# Patient Record
Sex: Female | Born: 1960 | Race: Black or African American | Hispanic: No | Smoking: Former smoker
Health system: Southern US, Community
[De-identification: ages and names within clinical notes are randomized; demographics above are authoritative.]

## PROBLEM LIST (undated history)

## (undated) DIAGNOSIS — K5790 Diverticulosis of intestine, part unspecified, without perforation or abscess without bleeding: Secondary | ICD-10-CM

## (undated) DIAGNOSIS — R011 Cardiac murmur, unspecified: Secondary | ICD-10-CM

## (undated) DIAGNOSIS — N289 Disorder of kidney and ureter, unspecified: Secondary | ICD-10-CM

## (undated) DIAGNOSIS — D649 Anemia, unspecified: Secondary | ICD-10-CM

## (undated) DIAGNOSIS — F419 Anxiety disorder, unspecified: Secondary | ICD-10-CM

## (undated) DIAGNOSIS — I10 Essential (primary) hypertension: Secondary | ICD-10-CM

## (undated) DIAGNOSIS — D62 Acute posthemorrhagic anemia: Secondary | ICD-10-CM

## (undated) DIAGNOSIS — K922 Gastrointestinal hemorrhage, unspecified: Secondary | ICD-10-CM

## (undated) DIAGNOSIS — K859 Acute pancreatitis without necrosis or infection, unspecified: Secondary | ICD-10-CM

## (undated) DIAGNOSIS — Z992 Dependence on renal dialysis: Secondary | ICD-10-CM

## (undated) DIAGNOSIS — Z940 Kidney transplant status: Secondary | ICD-10-CM

## (undated) DIAGNOSIS — N186 End stage renal disease: Secondary | ICD-10-CM

## (undated) DIAGNOSIS — N2581 Secondary hyperparathyroidism of renal origin: Secondary | ICD-10-CM

## (undated) DIAGNOSIS — E039 Hypothyroidism, unspecified: Secondary | ICD-10-CM

## (undated) DIAGNOSIS — M1990 Unspecified osteoarthritis, unspecified site: Secondary | ICD-10-CM

## (undated) DIAGNOSIS — N059 Unspecified nephritic syndrome with unspecified morphologic changes: Secondary | ICD-10-CM

## (undated) DIAGNOSIS — F40240 Claustrophobia: Secondary | ICD-10-CM

## (undated) HISTORY — PX: DILATION AND CURETTAGE OF UTERUS: SHX78

## (undated) HISTORY — PX: RETINAL DETACHMENT SURGERY: SHX105

## (undated) HISTORY — DX: Essential (primary) hypertension: I10

## (undated) HISTORY — PX: THROMBECTOMY / ARTERIOVENOUS GRAFT REVISION: SUR1351

## (undated) HISTORY — DX: Unspecified nephritic syndrome with unspecified morphologic changes: N05.9

## (undated) HISTORY — DX: Acute pancreatitis without necrosis or infection, unspecified: K85.90

## (undated) HISTORY — PX: EYE SURGERY: SHX253

## (undated) HISTORY — PX: BREAST BIOPSY: SHX20

## (undated) HISTORY — PX: PERITONEAL CATHETER INSERTION: SHX2223

## (undated) HISTORY — PX: THROMBECTOMY AND REVISION OF ARTERIOVENTOUS (AV) GORETEX  GRAFT: SHX6120

## (undated) HISTORY — PX: COLONOSCOPY: SHX174

---

## 1995-01-18 HISTORY — PX: KIDNEY TRANSPLANT: SHX239

## 1997-05-12 ENCOUNTER — Emergency Department (HOSPITAL_COMMUNITY): Admission: EM | Admit: 1997-05-12 | Discharge: 1997-05-12 | Payer: Self-pay | Admitting: Emergency Medicine

## 1997-05-14 ENCOUNTER — Other Ambulatory Visit: Admission: RE | Admit: 1997-05-14 | Discharge: 1997-05-14 | Payer: Self-pay | Admitting: Nephrology

## 1997-05-15 ENCOUNTER — Other Ambulatory Visit: Admission: RE | Admit: 1997-05-15 | Discharge: 1997-05-15 | Payer: Self-pay | Admitting: Nephrology

## 1997-05-19 ENCOUNTER — Other Ambulatory Visit: Admission: RE | Admit: 1997-05-19 | Discharge: 1997-05-19 | Payer: Self-pay | Admitting: Nephrology

## 1997-06-02 ENCOUNTER — Other Ambulatory Visit: Admission: RE | Admit: 1997-06-02 | Discharge: 1997-06-02 | Payer: Self-pay | Admitting: Nephrology

## 1997-06-20 ENCOUNTER — Other Ambulatory Visit: Admission: RE | Admit: 1997-06-20 | Discharge: 1997-06-20 | Payer: Self-pay | Admitting: Nephrology

## 1997-09-17 ENCOUNTER — Ambulatory Visit (HOSPITAL_COMMUNITY): Admission: RE | Admit: 1997-09-17 | Discharge: 1997-09-17 | Payer: Self-pay | Admitting: Obstetrics and Gynecology

## 1997-10-21 ENCOUNTER — Encounter: Payer: Self-pay | Admitting: Nephrology

## 1997-10-21 ENCOUNTER — Inpatient Hospital Stay (HOSPITAL_COMMUNITY): Admission: EM | Admit: 1997-10-21 | Discharge: 1997-10-30 | Payer: Self-pay | Admitting: Emergency Medicine

## 1997-10-23 ENCOUNTER — Encounter: Payer: Self-pay | Admitting: Nephrology

## 1998-02-27 ENCOUNTER — Encounter: Payer: Self-pay | Admitting: Nephrology

## 1998-04-29 ENCOUNTER — Encounter (HOSPITAL_COMMUNITY): Admission: RE | Admit: 1998-04-29 | Discharge: 1998-07-28 | Payer: Self-pay | Admitting: Nephrology

## 1998-10-26 ENCOUNTER — Emergency Department (HOSPITAL_COMMUNITY): Admission: EM | Admit: 1998-10-26 | Discharge: 1998-10-26 | Payer: Self-pay | Admitting: Emergency Medicine

## 1999-07-16 ENCOUNTER — Emergency Department (HOSPITAL_COMMUNITY): Admission: EM | Admit: 1999-07-16 | Discharge: 1999-07-16 | Payer: Self-pay | Admitting: Emergency Medicine

## 1999-07-16 ENCOUNTER — Inpatient Hospital Stay (HOSPITAL_COMMUNITY): Admission: EM | Admit: 1999-07-16 | Discharge: 1999-07-24 | Payer: Self-pay | Admitting: Emergency Medicine

## 1999-07-18 ENCOUNTER — Encounter: Payer: Self-pay | Admitting: Nephrology

## 1999-07-19 ENCOUNTER — Encounter: Payer: Self-pay | Admitting: Nephrology

## 1999-07-22 ENCOUNTER — Encounter: Payer: Self-pay | Admitting: General Surgery

## 1999-08-18 ENCOUNTER — Encounter: Payer: Self-pay | Admitting: Nephrology

## 1999-08-18 ENCOUNTER — Inpatient Hospital Stay (HOSPITAL_COMMUNITY): Admission: EM | Admit: 1999-08-18 | Discharge: 1999-08-23 | Payer: Self-pay | Admitting: Emergency Medicine

## 1999-08-18 HISTORY — PX: PERITONEAL CATHETER REMOVAL: SUR1106

## 1999-09-06 ENCOUNTER — Inpatient Hospital Stay (HOSPITAL_COMMUNITY): Admission: EM | Admit: 1999-09-06 | Discharge: 1999-09-10 | Payer: Self-pay | Admitting: Emergency Medicine

## 1999-09-06 ENCOUNTER — Encounter: Payer: Self-pay | Admitting: Nephrology

## 1999-09-08 ENCOUNTER — Encounter: Payer: Self-pay | Admitting: Nephrology

## 1999-09-28 ENCOUNTER — Encounter (HOSPITAL_COMMUNITY): Admission: RE | Admit: 1999-09-28 | Discharge: 1999-12-27 | Payer: Self-pay | Admitting: *Deleted

## 2000-04-10 ENCOUNTER — Encounter (INDEPENDENT_AMBULATORY_CARE_PROVIDER_SITE_OTHER): Payer: Self-pay | Admitting: Specialist

## 2000-04-10 ENCOUNTER — Inpatient Hospital Stay (HOSPITAL_COMMUNITY): Admission: RE | Admit: 2000-04-10 | Discharge: 2000-04-13 | Payer: Self-pay

## 2000-09-12 ENCOUNTER — Encounter: Payer: Self-pay | Admitting: Nephrology

## 2000-09-12 ENCOUNTER — Inpatient Hospital Stay (HOSPITAL_COMMUNITY): Admission: AD | Admit: 2000-09-12 | Discharge: 2000-09-16 | Payer: Self-pay | Admitting: Critical Care Medicine

## 2000-09-15 ENCOUNTER — Encounter: Payer: Self-pay | Admitting: Nephrology

## 2000-09-17 HISTORY — PX: AV FISTULA PLACEMENT: SHX1204

## 2000-09-25 ENCOUNTER — Ambulatory Visit (HOSPITAL_COMMUNITY): Admission: RE | Admit: 2000-09-25 | Discharge: 2000-09-25 | Payer: Self-pay | Admitting: Vascular Surgery

## 2000-11-24 ENCOUNTER — Encounter: Payer: Self-pay | Admitting: Emergency Medicine

## 2000-11-24 ENCOUNTER — Emergency Department (HOSPITAL_COMMUNITY): Admission: EM | Admit: 2000-11-24 | Discharge: 2000-11-24 | Payer: Self-pay | Admitting: Emergency Medicine

## 2000-12-13 ENCOUNTER — Ambulatory Visit (HOSPITAL_COMMUNITY): Admission: RE | Admit: 2000-12-13 | Discharge: 2000-12-13 | Payer: Self-pay | Admitting: Nephrology

## 2000-12-13 ENCOUNTER — Encounter: Payer: Self-pay | Admitting: Nephrology

## 2001-01-05 ENCOUNTER — Inpatient Hospital Stay (HOSPITAL_COMMUNITY): Admission: EM | Admit: 2001-01-05 | Discharge: 2001-01-08 | Payer: Self-pay | Admitting: Emergency Medicine

## 2001-01-05 ENCOUNTER — Encounter: Payer: Self-pay | Admitting: Nephrology

## 2001-01-06 ENCOUNTER — Encounter: Payer: Self-pay | Admitting: Nephrology

## 2001-01-07 ENCOUNTER — Encounter: Payer: Self-pay | Admitting: Nephrology

## 2001-02-19 ENCOUNTER — Ambulatory Visit (HOSPITAL_COMMUNITY): Admission: RE | Admit: 2001-02-19 | Discharge: 2001-02-19 | Payer: Self-pay | Admitting: Nephrology

## 2001-04-19 ENCOUNTER — Other Ambulatory Visit: Admission: RE | Admit: 2001-04-19 | Discharge: 2001-04-19 | Payer: Self-pay | Admitting: Obstetrics & Gynecology

## 2001-07-24 ENCOUNTER — Emergency Department (HOSPITAL_COMMUNITY): Admission: EM | Admit: 2001-07-24 | Discharge: 2001-07-25 | Payer: Self-pay | Admitting: Emergency Medicine

## 2001-08-31 ENCOUNTER — Inpatient Hospital Stay (HOSPITAL_COMMUNITY): Admission: EM | Admit: 2001-08-31 | Discharge: 2001-09-02 | Payer: Self-pay | Admitting: Emergency Medicine

## 2001-08-31 ENCOUNTER — Emergency Department (HOSPITAL_COMMUNITY): Admission: EM | Admit: 2001-08-31 | Discharge: 2001-08-31 | Payer: Self-pay | Admitting: *Deleted

## 2001-09-01 ENCOUNTER — Encounter: Payer: Self-pay | Admitting: Nephrology

## 2001-09-26 ENCOUNTER — Ambulatory Visit (HOSPITAL_COMMUNITY): Admission: RE | Admit: 2001-09-26 | Discharge: 2001-09-26 | Payer: Self-pay | Admitting: Nephrology

## 2002-02-17 HISTORY — PX: THROMBECTOMY: PRO61

## 2002-03-11 ENCOUNTER — Encounter: Payer: Self-pay | Admitting: Nephrology

## 2002-03-11 ENCOUNTER — Inpatient Hospital Stay (HOSPITAL_COMMUNITY): Admission: EM | Admit: 2002-03-11 | Discharge: 2002-03-16 | Payer: Self-pay | Admitting: Emergency Medicine

## 2002-03-13 ENCOUNTER — Encounter: Payer: Self-pay | Admitting: Vascular Surgery

## 2002-04-03 ENCOUNTER — Emergency Department (HOSPITAL_COMMUNITY): Admission: EM | Admit: 2002-04-03 | Discharge: 2002-04-03 | Payer: Self-pay | Admitting: Emergency Medicine

## 2002-04-27 ENCOUNTER — Inpatient Hospital Stay (HOSPITAL_COMMUNITY): Admission: EM | Admit: 2002-04-27 | Discharge: 2002-05-05 | Payer: Self-pay | Admitting: Emergency Medicine

## 2002-04-27 ENCOUNTER — Encounter: Payer: Self-pay | Admitting: Nephrology

## 2002-04-30 ENCOUNTER — Encounter: Payer: Self-pay | Admitting: Nephrology

## 2002-05-02 ENCOUNTER — Encounter: Payer: Self-pay | Admitting: Cardiology

## 2002-06-19 ENCOUNTER — Ambulatory Visit (HOSPITAL_COMMUNITY): Admission: RE | Admit: 2002-06-19 | Discharge: 2002-06-19 | Payer: Self-pay | Admitting: Nephrology

## 2002-07-16 ENCOUNTER — Encounter: Payer: Self-pay | Admitting: Nephrology

## 2002-07-16 ENCOUNTER — Inpatient Hospital Stay (HOSPITAL_COMMUNITY): Admission: EM | Admit: 2002-07-16 | Discharge: 2002-07-22 | Payer: Self-pay | Admitting: Emergency Medicine

## 2002-07-16 ENCOUNTER — Encounter: Payer: Self-pay | Admitting: Emergency Medicine

## 2002-07-17 ENCOUNTER — Encounter: Payer: Self-pay | Admitting: Nephrology

## 2002-07-18 HISTORY — PX: ARTERIOVENOUS GRAFT PLACEMENT: SUR1029

## 2002-07-20 ENCOUNTER — Encounter (INDEPENDENT_AMBULATORY_CARE_PROVIDER_SITE_OTHER): Payer: Self-pay | Admitting: *Deleted

## 2002-07-24 ENCOUNTER — Ambulatory Visit (HOSPITAL_COMMUNITY): Admission: RE | Admit: 2002-07-24 | Discharge: 2002-07-24 | Payer: Self-pay | Admitting: Internal Medicine

## 2002-07-24 ENCOUNTER — Encounter: Payer: Self-pay | Admitting: Internal Medicine

## 2002-08-16 ENCOUNTER — Ambulatory Visit (HOSPITAL_COMMUNITY): Admission: RE | Admit: 2002-08-16 | Discharge: 2002-08-16 | Payer: Self-pay | Admitting: Vascular Surgery

## 2002-08-23 ENCOUNTER — Ambulatory Visit (HOSPITAL_COMMUNITY): Admission: RE | Admit: 2002-08-23 | Discharge: 2002-08-23 | Payer: Self-pay | Admitting: Vascular Surgery

## 2002-08-23 ENCOUNTER — Encounter: Payer: Self-pay | Admitting: Vascular Surgery

## 2002-09-06 ENCOUNTER — Emergency Department (HOSPITAL_COMMUNITY): Admission: EM | Admit: 2002-09-06 | Discharge: 2002-09-07 | Payer: Self-pay | Admitting: Emergency Medicine

## 2002-09-06 ENCOUNTER — Encounter: Payer: Self-pay | Admitting: Nephrology

## 2002-09-13 ENCOUNTER — Encounter: Payer: Self-pay | Admitting: Vascular Surgery

## 2002-09-13 ENCOUNTER — Ambulatory Visit (HOSPITAL_COMMUNITY): Admission: RE | Admit: 2002-09-13 | Discharge: 2002-09-13 | Payer: Self-pay | Admitting: Vascular Surgery

## 2002-10-11 ENCOUNTER — Ambulatory Visit (HOSPITAL_COMMUNITY): Admission: RE | Admit: 2002-10-11 | Discharge: 2002-10-11 | Payer: Self-pay | Admitting: Nephrology

## 2002-11-28 ENCOUNTER — Inpatient Hospital Stay (HOSPITAL_COMMUNITY): Admission: EM | Admit: 2002-11-28 | Discharge: 2002-12-02 | Payer: Self-pay | Admitting: Emergency Medicine

## 2002-12-11 ENCOUNTER — Inpatient Hospital Stay (HOSPITAL_COMMUNITY): Admission: EM | Admit: 2002-12-11 | Discharge: 2002-12-13 | Payer: Self-pay | Admitting: Emergency Medicine

## 2002-12-18 ENCOUNTER — Other Ambulatory Visit: Admission: RE | Admit: 2002-12-18 | Discharge: 2002-12-18 | Payer: Self-pay | Admitting: Obstetrics & Gynecology

## 2002-12-23 ENCOUNTER — Ambulatory Visit (HOSPITAL_COMMUNITY): Admission: RE | Admit: 2002-12-23 | Discharge: 2002-12-23 | Payer: Self-pay | Admitting: Nephrology

## 2003-02-23 ENCOUNTER — Ambulatory Visit (HOSPITAL_COMMUNITY): Admission: RE | Admit: 2003-02-23 | Discharge: 2003-02-23 | Payer: Self-pay | Admitting: Gastroenterology

## 2003-07-08 ENCOUNTER — Ambulatory Visit (HOSPITAL_COMMUNITY): Admission: RE | Admit: 2003-07-08 | Discharge: 2003-07-08 | Payer: Self-pay | Admitting: Vascular Surgery

## 2003-07-18 HISTORY — PX: ARTERIOVENOUS GRAFT PLACEMENT: SUR1029

## 2003-08-12 ENCOUNTER — Ambulatory Visit (HOSPITAL_COMMUNITY): Admission: RE | Admit: 2003-08-12 | Discharge: 2003-08-12 | Payer: Self-pay | Admitting: Vascular Surgery

## 2003-10-03 ENCOUNTER — Encounter (HOSPITAL_COMMUNITY): Admission: RE | Admit: 2003-10-03 | Discharge: 2003-10-06 | Payer: Self-pay | Admitting: Nephrology

## 2003-11-18 ENCOUNTER — Ambulatory Visit (HOSPITAL_COMMUNITY): Admission: RE | Admit: 2003-11-18 | Discharge: 2003-11-18 | Payer: Self-pay | Admitting: Vascular Surgery

## 2003-12-24 ENCOUNTER — Inpatient Hospital Stay (HOSPITAL_COMMUNITY): Admission: EM | Admit: 2003-12-24 | Discharge: 2003-12-27 | Payer: Self-pay | Admitting: Emergency Medicine

## 2003-12-24 ENCOUNTER — Ambulatory Visit: Payer: Self-pay | Admitting: Internal Medicine

## 2004-02-02 ENCOUNTER — Ambulatory Visit (HOSPITAL_COMMUNITY): Admission: RE | Admit: 2004-02-02 | Discharge: 2004-02-02 | Payer: Self-pay | Admitting: Vascular Surgery

## 2004-03-22 ENCOUNTER — Ambulatory Visit: Payer: Self-pay | Admitting: Internal Medicine

## 2004-04-07 ENCOUNTER — Ambulatory Visit (HOSPITAL_COMMUNITY): Admission: RE | Admit: 2004-04-07 | Discharge: 2004-04-07 | Payer: Self-pay | Admitting: Internal Medicine

## 2004-04-12 ENCOUNTER — Ambulatory Visit: Payer: Self-pay | Admitting: Internal Medicine

## 2004-06-23 ENCOUNTER — Inpatient Hospital Stay (HOSPITAL_COMMUNITY): Admission: EM | Admit: 2004-06-23 | Discharge: 2004-06-28 | Payer: Self-pay | Admitting: Emergency Medicine

## 2004-06-24 ENCOUNTER — Ambulatory Visit: Payer: Self-pay | Admitting: Gastroenterology

## 2004-08-02 ENCOUNTER — Inpatient Hospital Stay (HOSPITAL_COMMUNITY): Admission: AD | Admit: 2004-08-02 | Discharge: 2004-08-06 | Payer: Self-pay | Admitting: Nephrology

## 2004-08-07 ENCOUNTER — Ambulatory Visit (HOSPITAL_COMMUNITY): Admission: RE | Admit: 2004-08-07 | Discharge: 2004-08-07 | Payer: Self-pay | Admitting: Vascular Surgery

## 2004-08-07 ENCOUNTER — Emergency Department (HOSPITAL_COMMUNITY): Admission: EM | Admit: 2004-08-07 | Discharge: 2004-08-07 | Payer: Self-pay | Admitting: Emergency Medicine

## 2004-08-10 ENCOUNTER — Ambulatory Visit (HOSPITAL_COMMUNITY): Admission: RE | Admit: 2004-08-10 | Discharge: 2004-08-10 | Payer: Self-pay | Admitting: Vascular Surgery

## 2004-12-01 ENCOUNTER — Encounter: Admission: RE | Admit: 2004-12-01 | Discharge: 2004-12-01 | Payer: Self-pay | Admitting: Obstetrics and Gynecology

## 2004-12-24 ENCOUNTER — Ambulatory Visit (HOSPITAL_COMMUNITY): Admission: RE | Admit: 2004-12-24 | Discharge: 2004-12-24 | Payer: Self-pay | Admitting: Nephrology

## 2005-07-11 ENCOUNTER — Ambulatory Visit (HOSPITAL_COMMUNITY): Admission: RE | Admit: 2005-07-11 | Discharge: 2005-07-11 | Payer: Self-pay | Admitting: Nephrology

## 2005-09-11 ENCOUNTER — Emergency Department (HOSPITAL_COMMUNITY): Admission: EM | Admit: 2005-09-11 | Discharge: 2005-09-11 | Payer: Self-pay | Admitting: Emergency Medicine

## 2005-11-10 ENCOUNTER — Inpatient Hospital Stay (HOSPITAL_COMMUNITY): Admission: EM | Admit: 2005-11-10 | Discharge: 2005-11-13 | Payer: Self-pay | Admitting: Emergency Medicine

## 2006-01-02 ENCOUNTER — Encounter: Admission: RE | Admit: 2006-01-02 | Discharge: 2006-01-02 | Payer: Self-pay | Admitting: Nephrology

## 2006-06-07 ENCOUNTER — Ambulatory Visit: Payer: Self-pay | Admitting: Surgical

## 2006-06-07 ENCOUNTER — Ambulatory Visit (HOSPITAL_COMMUNITY): Admission: RE | Admit: 2006-06-07 | Discharge: 2006-06-07 | Payer: Self-pay | Admitting: Vascular Surgery

## 2006-10-14 ENCOUNTER — Ambulatory Visit (HOSPITAL_COMMUNITY): Admission: RE | Admit: 2006-10-14 | Discharge: 2006-10-14 | Payer: Self-pay | Admitting: Nephrology

## 2007-01-04 ENCOUNTER — Inpatient Hospital Stay (HOSPITAL_COMMUNITY): Admission: EM | Admit: 2007-01-04 | Discharge: 2007-01-08 | Payer: Self-pay | Admitting: Emergency Medicine

## 2007-06-22 ENCOUNTER — Encounter: Admission: RE | Admit: 2007-06-22 | Discharge: 2007-06-22 | Payer: Self-pay | Admitting: Nephrology

## 2007-09-28 ENCOUNTER — Encounter: Admission: RE | Admit: 2007-09-28 | Discharge: 2007-09-28 | Payer: Self-pay | Admitting: Obstetrics and Gynecology

## 2007-11-26 ENCOUNTER — Ambulatory Visit (HOSPITAL_COMMUNITY): Admission: RE | Admit: 2007-11-26 | Discharge: 2007-11-26 | Payer: Self-pay | Admitting: Nephrology

## 2008-01-21 ENCOUNTER — Ambulatory Visit (HOSPITAL_COMMUNITY): Admission: RE | Admit: 2008-01-21 | Discharge: 2008-01-21 | Payer: Self-pay | Admitting: Vascular Surgery

## 2008-01-21 ENCOUNTER — Ambulatory Visit: Payer: Self-pay | Admitting: Vascular Surgery

## 2008-11-21 ENCOUNTER — Encounter: Admission: RE | Admit: 2008-11-21 | Discharge: 2008-11-21 | Payer: Self-pay | Admitting: Nephrology

## 2010-02-06 ENCOUNTER — Encounter: Payer: Self-pay | Admitting: Nephrology

## 2010-02-06 ENCOUNTER — Encounter: Payer: Self-pay | Admitting: Gastroenterology

## 2010-05-03 LAB — POCT I-STAT 4, (NA,K, GLUC, HGB,HCT): Sodium: 132 mEq/L — ABNORMAL LOW (ref 135–145)

## 2010-06-01 NOTE — Op Note (Signed)
[name], [name] NO.:  [id number]   MEDICAL RECORD NO.:  [id number]          PATIENT TYPE:  AMB   LOCATION:  SDS                          FACILITY:  [facility]   PHYSICIAN:  Rosetta Posner, M.D.    DATE OF BIRTH:  1960/03/11   DATE OF PROCEDURE:  01/21/2008  DATE OF DISCHARGE:  01/21/2008                               OPERATIVE REPORT   PREOPERATIVE DIAGNOSIS:  End-stage renal disease with occluded right  upper arm AV Gor-Tex graft.   POSTOPERATIVE DIAGNOSIS:  End-stage renal disease with occluded right  upper arm AV Gor-Tex graft.   PROCEDURE:  Thrombectomy and revision interposition jump to higher  axillary vein of right upper arm AV Gor-Tex graft.   SURGEON:  Rosetta Posner, MD   ASSISTANT:  Jacinta Shoe, PA-C   ANESTHESIA:  MAC.   COMPLICATIONS:  None.   DISPOSITION:  To recovery room in stable.   PROCEDURE IN DETAIL:  The patient was taken to the operating room and  placed in supine position.  The area of the right arm and right axilla  were prepped and draped in usual sterile fashion.  Using local  anesthesia, an incision was made over the prior axillary incision scar  and carried down to the graft.  The graft was high into the axilla.  The  patient had had a recent sonogram and angioplasty of the venous  anastomosis and did have thickening and a fair degree of stenosis of  this area.  The vein was exposed high in the axilla above the level of  stenosis.  This was above the level of valve cusps.  The vein above this  area was a large caliber.  The graft was opened, transversed near the  venous anastomosis, the graft itself was thrombectomized, and excellent  inflow was encountered.  This was slushed with normal saline and  reoccluded.  Next, the vein was exposed high in the axilla and was  occluded.  Tributary branches were ligated and divided, and vein was  transected.  A new interposition graft of 8-mm Gore-Tex was brought into  the field  and was sewn end-to-end to the vein with a running 6-0 Prolene  suture.  This was then cut to appropriate length and was sewn to end-to-  end to the old graft with a running 6-0 Prolene suture.  Clamps were  removed and excellent thrill was noted.  The wounds were irrigated with  saline.  Hemostasis with electrocautery.  Wounds were closed with 3-0  Vicryl in the subcutaneous and subcuticular tissues.  Benzoin and Steri-  Strips were applied.      Rosetta Posner, M.D.  Electronically Signed    TFE/MEDQ  D:  01/21/2008  T:  01/22/2008  Job:  [id number]

## 2010-06-01 NOTE — H&P (Signed)
NAMEGRAECYN, [name] NO.:  [id number]   MEDICAL RECORD NO.:  [id number]          PATIENT TYPE:  INP   LOCATION:  5123                         FACILITY:  [facility]   PHYSICIAN:  Windy Kalata, M.D.DATE OF BIRTH:  May 04, 1960   DATE OF ADMISSION:  01/04/2007  DATE OF DISCHARGE:                              HISTORY & PHYSICAL   REASON FOR ADMISSION:  Abdominal pain, possible pancreatitis.   HPI:  This is a 50 year old black female with a history of idiopathic  pancreatitis, who complains of intermittent nausea and vomiting and  diarrhea over the last two weeks.  Yesterday, she was in her usual state  of health, but today, after dialysis, developed severe abdominal pain  and vomiting on multiple occasions, along with loose, watery stools.  This is very similar to previous presentations with pancreatitis with  the last one being a little over a year ago.  She did have a CMET,  amylase and lipase done as an outpatient on February 18, 2006, all of  which were normal.  Currently, laboratory in the emergency room is  pending.   PAST MEDICAL HISTORY:  1. Significant for end-stage renal disease, secondary to      glomerulonephritis.  2. History of pancreatitis with negative workup.  3. History of hypertension, though currently not on antihypertensives.  4. Questionable history of ACE-inhibitor-induced angioedema of the      bowel.   ALLERGIES:  CODEINE causes urticaria.  PENICILLIN causes urticaria.  ACE  INHIBITOR, questionable angioedema of the bowel.   CURRENT MEDICATIONS INCLUDE:  1. Pancrease two tablets t.i.d. with meals.  2. Phos-Lo one with meals.  3. Zetia 10 mg a day.  4. Nephro-Vite one a day.   SOCIAL HISTORY:  She is an ex-smoker, quit in 2003.  Denies alcohol use.  She is divorced and has one daughter.  She lives with her mother.   FAMILY HISTORY:  Unknown as she is adopted.   REVIEW OF SYSTEMS:  Appetite was good yesterday, though obviously  decreased right now.  No chest pains, no shortness of breath.  Abdominal  pain as noted above, which she describes as crampy, predominantly in the  periumbilical region.  No new neuropathic symptoms.  No new arthritic  complaints.  No dysuria.  Rest of review of systems is unremarkable.   PHYSICAL EXAM:  Temperature 99.5, pulse 91, respirations 20, blood  pressure 153/93.  IN GENERAL:  A 50 year old black female in moderate distress, secondary  to abdominal pain.  NECK:  Reveals no JVD, no lymphadenopathy, no bruits.  LUNGS:  Clear to auscultation.  HEART:  Regular rate and rhythm without murmur, rub or gallop.  ABDOMEN:  Positive bowel sounds.  There is diffuse tenderness, though  mostly over the periumbilical region.  Abdomen is soft with no rebound.  Pulses are 2/4 in the carotids, radials, femorals, dorsalis pedis and  posterior tibial.  EXTREMITIES:  Reveal no clubbing, cyanosis or edema.  There is an AV  graft in the left upper arm with a nice thrill and bruit.  NEUROLOGIC EXAM:  Cranial nerves intact.  Motor and sensory intact.  No  asterixis.   LABORATORY:  Pending.   IMPRESSION:  1. Abdominal pain, probably secondary to recurrent pancreatitis.  2. Anemia.  3. Secondary hyperparathyroidism.  4. End-stage renal disease.   PLAN:  1. We will obtain CBC, CMET with phosphorus, amylase, lipase and acute      abdominal series.  2. We will admit for IV pain control with IV morphine and control of      nausea with IV Zofran.  3. We will repeat laboratory in the morning.  4. We will keep n.p.o. for now and hold her medications for now.           ______________________________  Windy Kalata, M.D.     MTM/MEDQ  D:  01/04/2007  T:  01/05/2007  Job:  [id number]

## 2010-06-04 NOTE — H&P (Signed)
[place]. [facility]  Patient:    [name], [name] Visit Number: [id number] MRN: [id number]          Service Type: MED Location: [id number] Attending Physician:  Selena Batten Dictated by:   Vela Prose, P.A. Admit Date:  09/12/2000   CC:         [place]. Rise Patience, M.D.   History and Physical  HISTORY OF PRESENT ILLNESS:  [name] is a 50 year old African-American female with end-stage renal disease on hemodialysis at the [facility]. Today at dialysis, she had chills and a temperature of 101 degrees, which did not decrease with antibiotics.  The patient went home and the temperature increased up to 102 degrees.  She called the [facility], who informed her to go straight to the hospital.  She denied any coughing, dysuria (makes very little urine), abdominal pain, nausea, vomiting, or diarrhea.  She has been admitted for catheter removal and antibiotic therapy.  At the hemodialysis center, she received vancomycin 1 g and tobramycin 120 mg on September 12, 2000.  PAST MEDICAL HISTORY: 1. 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    In August of 2001, she had a PD catheter removal and Ash catheter placed. 2. Malignant hypertension. 3. Secondary hyperparathyroidism.  ALLERGIES:  CODEINE and PENICILLIN.  MEDICATIONS: 1. Os-Cal 500 mg two a.c. three times a day. 2. Darvocet-N 100 one to two q.6h. p.r.n. pain. 3. Reglan 5 mg a.c. and h.s. q.i.d. 4. Nephro-Vite one p.o. q.d.  FAMILY HISTORY:  Noncontributory.  SOCIAL HISTORY:  She lives in [place], [place].  She has one daughter, who is 16 years old.  REVIEW OF SYSTEMS:  Positive for lower back pain, weakness, and fatigue. Negative for chest pain, shortness of breath, nausea, vomiting, diarrhea, constipation, abdominal pain, and edema.  No visual changes.  No cough.  PHYSICAL EXAMINATION:  The  temperature is 101.2 degrees, blood pressure 101/53, pulse 102, and respirations 20.  HEENT:  Normocephalic and atraumatic.  The sclerae are nonicteric.  No external lesions of the ear canals.  No erythema or exudate of pharynx.  NECK:  No cervical lymphadenopathy.  No thyromegaly.  HEART:  Regular rate and rhythm without murmurs, rubs, or gallops.  LUNGS:  Clear to auscultation bilaterally.  ABDOMEN:  Soft and nontender.  Bowel sounds were positive in all quadrants.  EXTREMITIES:  His strength is 4/5 in the upper and lower extremities bilaterally.  Pedal pulses intact on the DP and PT of both feet.  The feet are warm.  NEUROLOGIC:  Sensation intact in upper and lower extremities.  Cranial nerves II-XII are grossly intact.  MUSCULOSKELETAL:  No joint deformities.  ASSESSMENT AND PLAN: 1. Fever.  Remove hemodialysis catheter.  Continue with antibiotics.    Vancomycin and tobramycin to be dosed per pharmacy.  A CBC and a chest    x-ray. 2. End-stage renal disease.  The patients normal hemodialysis days are    Tuesday, Thursday, and Saturday.  She dialyzed the day of admission. 3. Malignant hypertension.  Currently controlled with hemodialysis.  No    medications are required at this time. 4. Iron deficiency anemia.  The patient is on Epogen 2500 mg IV with each    hemodialysis treatment.  At the outpatient center, she was to receive InFeD    100 mg IV x 5 treatments.  We will continue to change the dosage to 250 mg    IV x 2  days and then go to 150 mg every week.  Her last hemoglobin was 12.1    at the hemodialysis center. 5. Secondary hyperparathyroidism.  The patient is on Calcijex 0.50 mcg IVP    with each hemodialysis treatment. 6. Questionable peritoneal dialysis catheter placement after the infection    clears.  The patient has been examined and the plan has been discussed with Caren Griffins B. Lorrene Reid, M.D. Dictated by:   Vela Prose, P.A. Attending Physician:  Selena Batten DD:  09/13/00 TD:  09/13/00 Job: [id number]

## 2010-06-04 NOTE — H&P (Signed)
[place]. [facility]  Patient:    [name], [name]                             MRN: [id number] Adm. Date:  [id number] Attending:  Dennis Bast Dictator:   Maia Plan, P.A.                         History and Physical  REASON FOR ADMISSION:  Severe abdominal pain.  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 this morning.  The patient has been treated on and off with intraperitoneal antibiotics since May 17, 1999.  On May 17, 1999, she had a cell count of 538, and was treated with IP Ancef and Fortaz through May 25, 1999, and all cultures returned negative.  Cell count repeated returned at 778, at which point Ancef and Fortaz intraperitoneally was restarted.  When her cell count rose to 1494, antibiotics were changed to intraperitoneal vancomycin and patient improved.  Peritoneal fluid cultures drawn on May 31, 1999, ultimately grew out Corynebacterium jeikeium.  She completed a course of vancomycin with her last dose being July 14, 1999.  She had a subxiphoid furuncle/abscess develop which was incised and drained by Dr. Rise Patience on the July 08, 1999.  That grew out a pan sensitive Staph coagulase negative.  Since then she has been feeling very well.  Her peritoneal cell counts have been less 66 for the last couple of weeks.  At 2 a.m. this morning she woke up with the sudden onset of sharp, diffuse abdominal pain, nausea and vomiting x 3, positive constipation for the last couple of days.  She has a temperature of 99.2 and is on the ER cart in the fetal position having received morphine.  Three rapid exchanges have been done.  Initially she had difficulty draining and the fluid was cloudy.  She  is admitted now with refractory peritonitis versus new organism peritonitis.  We will add gram negative coverage and check fungal stain.  ALLERGIES:  PENICILLIN and CODEINE cause hives.  CURRENT MEDICATIONS: 1. Labetalol 200 mg t.i.d. 2. Rocaltrol 2 mcg daily. 3. Nephro-Vite 1 daily. 4. Os-Cal 500 mg 1 with meals. 5. klonodine 0.1 mg p.r.n. 6. EPO 10,000 units weekly. 7. K-Dur 40 mEq t.i.d.  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  A history of malignant hypertension requiring hospitalization with encephalopathy and cortical blindness, October 1999.  Chronic hypokalemia. The patient is on CAPD.  PHYSICAL EXAMINATION:  VITAL SIGNS:  Blood pressure 130/90, pulse is 114 irregular.  Respirations are 18.  Temperature 99.2.  GENERAL:  In general a well-developed well-nourished black female in moderate distress secondary to abdominal pain.  LUNGS:  Clear to auscultation.  HEART:  Tachycardic and regular.  No rub, no murmur.  ABDOMEN:  Decreased bowel sounds.  There is positive abdominal distention, positive rebound, positive minor to severe abdominal pain with palpation. The subxiphoid incision is well healed with no expressable drainage.  The exit site is unremarkable.  EXTREMITIES:  No lower extremity edema.  LABORATORY DATA:  White count  13,400, hemoglobin 9.9, platelets 609,000. Sodium 133, potassium 2.1, chloride 103, CO2 24, BUN 27, creatinine 12.6, glucose 128, calcium 8.6, lipase 47.  ASSESSMENT AND PLAN: 1. Abdominal pain suspect peritonitis.  Will add gram negative coverage, check    vancomycin level, follow PV fluids, cell counts every morning. Rule out    fungal peritonitis. 2. End-stage renal disease on CAPD.  Continue q.i.d. exchanges with 2.5 liter    exchanges. 3. Hypertension.  Continue labetalol and p.r.n. klonodine. 4. Hypokalemia.  Will replete both p.o. and IP. 5. Anemia of chronic disease.  Continue EPO (transfer ______ saturation    level 25%, ferritin level greater than 800 in June of 2001). 6. Secondary hyperparathyroidism.  Continue Rocaltrol (PTH decreased from 1800    to 1200 in April of 2001.  Will follow up. DD:  07/16/99 TD:  07/16/99 Job: [id number]

## 2010-06-04 NOTE — Discharge Summary (Signed)
[name]. [facility]  Patient:    [name], [name]                             MRN: [id number] Adm. Date:  [id number] Disc. Date: [id number] Attending:  Dennis [name] Dictator:   [name]                           Discharge Summary  DISCHARGE DIAGNOSES:  1.  Peritonitis with positive fluid cultures for Enterobacter cloacae. 2.  End stage renal disease on CAPD (continued ambulatory peritoneal     dialysis). 3.  Hypertension. 4.  Anemia. 5.  Hypokalemia and hyperkalemia.  DISCHARGE MEDICATIONS: 1.  Cipro 500 mg one p.o. q.d. for the next 14 days. 2.  Tressie Ellis 1.5 grams in IP fluid q.d. 3.  Rocaltrol .5 mCg four pills p.o. q.d. 4.  Protonix 40 mg one p.o. q.d. 5.  Reglan 5 mg one p.o. q.i.d. a.c. and h.s. 6.  Epogen 10,000 units subcu per day, pending reassessment. 7.  Calcium Carbonate 500 mg one p.o. t.i.d. q. a.c. 8.  Labetolol 200 mg one pill three times a day. 9.  Nephrovite one p.o. q.d. 10. Do not take any potassium until further notice.  BRIEF HISTORY AND PHYSICAL:  This is a 50 year old black female with end stage renal disease secondary to glomerulonephritis and hypertension, with a history of secondary hyperparathyroidism and anemia of chronic disease who was admitted with severe abdominal pain.  She has a recent history of being treated with intraperitoneal antibiotics on and off since May 17, 1999.  She recently had peritoneal fluid that grew out carinii bacterium, jeikeium, and her course of Vancomycin was just finished on July 14, 1999.  Initial drainage of the IP fluid upon this admission was cloudy, and she was admitted with refractory peritonitis.  PHYSICAL EXAMINATION:  VITAL SIGNS:  On physical exam upon admission, the patient had a blood pressure of 130/90, pulse of 114, respirations 18, temperature 99.2.  GENERAL:  In general, she is a well nourished, well developed black female who is in moderate distress secondary to  pain.  LUNGS:  Her lungs were clear. HEART:  Heart was tachycardiac.  ABDOMEN:  Her abdomen showed decreased bowel sounds with distention, rebound and abdominal pain to palpation.  EXTREMITIES: She had no edema on her extremities.  Initial lab showed a white count of 13.4, hemoglobin 9.9, platelets 239,000. She had a sodium of 133, potassium 2.1, chloride 103, cO2 24, BUN 27, creatinine 12.6, glucose 128, calcium 8.6, lipase 47.  HOSPITAL COURSE: 1.  Refractory peritonitis, with recent positive cultures, recently completed     a course of Vancomycin, with previous treatments with Ancef and Fortaz     prior to that.  The patient was admitted and cultured, and started on     Gentamicin 50 mg intraperitoneally once a day, as well as Cipro IV 500 mg     q.d.  The patients fever curve initially was T-maxes of over 101 the     first couple of days, but by the 5th she no longer had temperatures of     over 100, and remained afebrile until discharge.  Her abdominal pain also     slowly resolved.  However, she remained relatively nauseated until the day prior to admission, when she was able to hold down food if she wanted to eat it.  As mentioned, her fluid culture from the 29th grew out Enterobacter cloacae, which was sensitive to the Gentamicin and Cipro.  Yeast cultures were negative, as were blood cultures.  As the patient was feeling better and fever curve came down, the Gentamicin was stopped, Cipro was continued, which she was sent out on to take as an outpatient for three weeks total.  In addition, the Tressie Ellis was added two days prior to discharge, to be administered also for a total of three weeks.      Upon discharge, the patient was dialyzing without problems, and she felt   like she was back at her baseline.  2.  End stage renal disease.  The patient continued to do her CAPD this admission with the only change to her regimen being some addition of potassium to her fluid  early on to get her potassium up. 3.  Hypokalemia:  The patient was treated aggressively again with IP potassium, as well as IV and p.o. potassium when tolerated.  Upon discharge, the patient had a potassium of 5.1, so p.o. potassium was held, and she is to have it rechecked with a BMP on Monday, July 26, 1999.  4.  Anemia.  Her hemoglobin was as low as 6.5 on July 6, and the patient had     an iron of 14, TIBC of 92 and a percent saturation of 15%.  She was started on aggressive InFeD IV therapy, and she received two units of packed red blood cells.  This should be followed up as an outpatient.  OTHER STUDIES THIS ADMISSION:  The patient had an abdominal film on admission which showed no acute abnormality.  She then had an ultrasound of the abdomen which showed normal gallbladder, moderate amount of free fluid in the abdomen and pelvis, consistent with peritoneal dialysis.  The kidneys showed small and irregular.  Otherwise normal.  The patient also underwent a CT of the abdomen and pelvis, which showed colonic diverticulosis and pelvic ascites.  Also, there were changes consistent with peritoneal dialysis.  Repeat abdominal series on June 29 showed possible mild ileus and possible gastritis.  The patients white blood cell count in her fluid:  Initially the cell count was 4500 with 95% PMNs.  This came down slowly in the course of admission, and the day prior to discharge the cell count was down to 23.  Again, Enterobacter cloacae was grown out, which was resistant to Ampicillin and Ampicillin sulbactam as well as cephalothin, cefazolin and Zosyn, sensitive to all other antibiotics.  DISPOSITION:  The patient was sent home on July 24, 1999, with instructions to  follow up at home training unit for intraperitoneal Tressie Ellis on Sunday, July 8, and again to follow up on July 9 for CBC and a cell count on IP fluid, as well as [place] administration and training.  When patient left the  floor, she was in stable condition. DD:  07/24/99 TD:  07/24/99 Job: [id number]

## 2010-06-04 NOTE — Op Note (Signed)
[name], [name] NO.:  [id number]   MEDICAL RECORD NO.:  [id number]          PATIENT TYPE:  OIB   LOCATION:  2899                         FACILITY:  [facility]   PHYSICIAN:  Nelda Severe. Kellie Simmering, M.D.  DATE OF BIRTH:  [birth year]   DATE OF PROCEDURE:  02/02/2004  DATE OF DISCHARGE:                                 OPERATIVE REPORT   PREOPERATIVE DIAGNOSIS:  Thrombosed arteriovenous Gore-Tex graft right upper  arm.   POSTOPERATIVE DIAGNOSIS:  Thrombosed arteriovenous Gore-Tex graft right  upper arm.   OPERATION:  Thrombectomy arteriovenous Gore-Tex graft right upper arm with  insertion new segment of graft from existing graft axillary vein with 6 mm  Gore-Tex.   SURGEON:  Tinnie Gens, M.D.   FIRST ASSISTANT:  Claudette Krell, N.P.   ANESTHESIA:  Local.   PROCEDURE:  Patient was taken to the operating room, placed in the supine  position, at which time the right upper extremity was prepped with Betadine  scrub and solution, draped in routine sterile manner.  After infiltration of  1% Xylocaine with epinephrine, a longitudinal incision was made through the  previous scar near the axilla.  The Gore-Texed axillary vein anastomosis was  dissected free and 3000 units of heparin given intravenously.  A transverse  opening was made in the graft which was filled with fresh thrombus.  The  graft was easily thrombectomized with excellent inflow being reestablished.  There was some mild circumferential narrowing at the venous anastomosis.  Vein proximal to this was excellent in quality and 6 mm in size.  Previous  anastomosis was excised and new piece of 6 mm graft anastomosed end-to-end  to the old graft and end-to-end to the more proximal vein with 6-0 Prolene.  Clamp was then released and there was an excellent pulse and thrill in the  graft.  No protamine was given.  The wound was irrigated with saline, closed  in layers with Vicryl in a subcuticular fashion.  Sterile  dressing applied.  The patient taken to the recovery room in satisfactory condition.       JDL/MEDQ  D:  02/02/2004  T:  02/02/2004  Job:  [id number]

## 2010-06-04 NOTE — Discharge Summary (Signed)
NAMECINCERE, HEATH NO.:  [id number]   MEDICAL RECORD NO.:  [id number]          PATIENT TYPE:  INP   LOCATION:  5504                         FACILITY:  [facility]   PHYSICIAN:  Darrold Span. Florene Glen, M.D.  DATE OF BIRTH:  29-Sep-1960   DATE OF ADMISSION:  12/24/2003  DATE OF DISCHARGE:  12/27/2003                                 DISCHARGE SUMMARY   ADMITTING DIAGNOSES:  1.  Abdominal pain and diarrhea.  2.  End-stage renal disease on chronic hemodialysis.  3.  Anemia of chronic disease.  4.  Secondary hyperparathyroidism.  5.  Hypertension.   DISCHARGE DIAGNOSES:  1.  Abdominal pain and diarrhea, resolved, etiology unclear, possibly      angioedema of bowel wall secondary to ACE inhibitor, now stopped, verses      possibly ischemic.  2.  End-stage renal disease on chronic hemodialysis.  3.  Anemia of chronic disease.  4.  Secondary hyperparathyroidism.  5.  Hypertension.   BRIEF HISTORY:  A 50 year old black female with end-stage renal disease  secondary to hypertension, on chronic hemodialysis at the [facility] every Tuesday, Thursday, Saturday, presents with complaints of  low-grade temperatures, nausea, vomiting, abdominal pain and diarrhea.  She  denies any dysuria or URI symptoms.  She is being admitted for workup.   LABS ON ADMISSION:  White count 13,400 with 86 segs, 10 lymphs, 3 monocytes,  1 eosinophil.  Hemoglobin 13.3, platelets 250,000, sodium 138, potassium  4.1, chloride 96, CO2 29, glucose 103, BUN 19, creatinine 7.9, calcium 10,  albumin 4.1, phosphorus 5.1, amylase 198, lipase 46, LFTs within normal  limits.  KUB normal bowel gas pattern, no active disease on chest x-ray.   HOSPITAL COURSE:  Patient was placed on clear liquids.  A CAT scan of her  abdomen showed nonspecific dilated right abdominal and pelvic small bowel  loops suspicious for ischemia.  There was no evidence of pneumoperitoneum or  pneumatosis.  The liver  configuration was suggestive of cirrhosis but needed  to be correlated clinically.  There was also colonic diverticulosis.  A  gastroenterology consult was obtained.  Dr. Scarlette Shorts saw the patient who  proceeded with an upper endoscopy on December 26, 2003.  It was a normal exam  from the proximal esophagus to the duodenal second portion.  Hepatitis  serologies were obtained for the liver finding on CAT scan and these were  all negative.  Blood cultures were drawn and they were all negative.  A C1  esterase inhibiting function was within normal limits at 131%.  Her  lisinopril was stopped and her symptoms resolved.  She still had a slightly  distended abdomen but no longer in pain or with diarrhea.  She was  tolerating solid foods well.  The working diagnosis at time of discharge was  that of ACE inhibitor induced angioedema of the bowel wall.  She will be  labeled ACE inhibitor intolerant for this.  We will continue to monitor her  at the kidney center for possibly hemodialysis associated hypotension being  possibly  correlated with symptoms.  Blood pressures were well controlled off  antihypertensives.  At the time of discharge her blood pressures were  approximately 106/64.  Additionally, her dialysis time has been increased to  3 hours to avoid interdialytic hypotension.   DISCHARGE MEDICATIONS:  1.  Nephro-Vite vitamin one daily.  2.  Baby aspirin 81 mg daily.  3.  Lipitor 10 mg daily.  4.  She was instructed to stop her lisinopril.  5.  Calcitriol 1.25 mcg IV each dialysis.  6.  EPO 500 units IV each dialysis.   She is to be labeled ACE INHIBITOR INTOLERANT secondary to possible  angioedema of bowel wall.  A capsule endoscopy was planned to be done as an  outpatient through Dr. Caroleen Hamman office.  Dialysis Tuesday, Thursday,  Saturday at [facility], dialysis time increased to 3  hours.      RRK/MEDQ  D:  02/21/2004  T:  02/22/2004  Job:  [id number]   cc:    Docia Chuck. Henrene Pastor, M.D. [facility]  [address]  [place], [place] [address]

## 2010-06-04 NOTE — Discharge Summary (Signed)
[place]  Patient:    [name], [name] Visit Number: [id number] MRN: [id number]          Service Type: MED Location: [phone] Attending Physician:  Sol Blazing Dictated by:   Foye Clock, P.A. Admit Date:  01/05/2001 Discharge Date: 01/08/2001   CC:         [facility]   Discharge Summary  ADMISSION DIAGNOSES: 1. Abdominal pain with nausea and vomiting, pancreatitis versus biliary    colic versus cholecystitis. 2. End-stage renal disease on chronic hemodialysis. 3. Hypertension. 4. Anemia secondary to chronic renal failure. 5. Status post parathyroidectomy.  DISCHARGE DIAGNOSES: 1. Pancreatitis. 2. End-stage renal disease, on chronic hemodialysis. 3. Mild ileus. 4. Hypertension. 5. Status post parathyroidectomy, secondary to hyperparathyroidism.  HISTORY OF PRESENT ILLNESS:  The patient is a 50 year old black female with end-stage renal disease on chronic hemodialysis secondary to hypertension who failed on peritoneal dialysis last year secondary to recurrent peritonitis. She presented with a 24 hour history of progressive epigastric pain, nausea and vomiting.  She did not have any fevers or chills.  She denied any alcohol use, drugs or NSAIDs.  She has no history of reported gallstones or pancreatitis.  She was seen by Dr. Roney Jaffe, nephrologist, in the ER with physical examination showing the patient was alert and in pain, vomiting in the ER. Abdomen showed bowel sounds were positive and they were normal throughout. She had epigastric tenderness but no rebound.  No right upper quadrant tenderness..  No masses felt.  She had voluntary guarding.  Her amylase was elevated at 218, lipase up to 133.  Abdominal films showed an ileus.  No free air noted.  No obstruction noted.  She was admitted by Dr. Jonnie Finner with abdominal pain and ileus, probable pancreatitis versus biliary colic versus cholecystitis.  CT  scan of abdomen, IV fluids, and antiemetics were given. X-ray studies ruled out acute abdomen.  She had no fever.  She had a white count on admission of 14.6, hemoglobin 15.3, potassium 3.7, sodium 135, glucose 121, BUN 41, creatinine 10.8.  Calcium 8.2, albumin 4.6.  CO2 25. AST, ALT, and total bilirubin were normal.  HOSPITAL COURSE:  The patient was admitted by Dr. Jonnie Finner with her abdominal symptoms and findings, significantly elevated amylase and lipase.  Rechecked amylase was down to 165 the next day and down to 106, normal three days later. Lipase was normal the next day.  Her abdominal ultrasound showed no gallstones.  The liver had normal echogenic pattern, no evidence of ductal dilation.  Pancreas appeared grossly normal.  Both kidneys were small and echogenic in nature.  Abdominal aorta was normal in caliber.  Chest and acute abdominal films showed no active lung disease but probably a mild ileus.  No free air and no obstruction.  Her CT scan of her abdomen and pelvis showed probable ileus, no evidence of pancreatitis, rectosigmoid colonic diverticula, a small amount of free fluid in the pelvis.  CT of the pelvis was similar to the CT of the abdomen with primary small-bowel fluid filled bowel loops suggestive of possible early ileus.  The patient felt better the next day with less pain with her IV antiemetics. Clear liquid diet was started and she did not eat until December 22, because she was awaiting ultrasound to be done.  By December 22, she felt much better and it was felt she probably had biliary colic versus pancreatitis.  She was tolerating solids by the  afternoon of December 23 and was ready for discharge home.  She was placed on hemodialysis on December 23 without difficulty.  She was on her normal schedule at the time of discharge.  Her blood pressure is stable at 124/75 at the time of discharge, on Lisinopril.  MEDICATIONS ON DISCHARGE: 1. Nephro-Vite one  q.d. 2. Calcium carbonate 500 mg two a.c. 3. Lisinopril 5 mg q.h.s. 4. InFeD 100 mg every hemodialysis on Thursday treatment days. 5. Epogen 3500 units q. dialysis. 6. Calcijex 0.5 mcg q. dialysis.  Her dry weight was lowered to 66.5 kg and will be followed at the kidney center.  She was discharged home and will have follow-up at the [facility]. Dictated by:   Foye Clock, P.A. Attending Physician:  Sol Blazing DD:  01/30/01 TD:  01/31/01 Job: [id number]

## 2010-06-04 NOTE — Discharge Summary (Signed)
NAME:  [name], [name]                             ACCOUNT NO.:  [id number]   MEDICAL RECORD NO.:  [id number]                   PATIENT TYPE:  INP   LOCATION:                                       FACILITY:  [facility]   PHYSICIAN:  Tamika J. Lavonia Drafts, M.D.                DATE OF BIRTH:  January 14, 1961   DATE OF ADMISSION:  04/27/2002  DATE OF DISCHARGE:  05/05/2002                                 DISCHARGE SUMMARY   DISCHARGE DIAGNOSES:  1. Staphylococcus aureus bacteremia.  2. End-stage renal disease.  3. Hypertension.  4. Anemia.  5. Thrombophlebitis.   DISCHARGE MEDICATIONS:  1. Rifampin 300 mg b.i.d. x 10 more days.  2. Vioxx 25 mg daily.  3. Nephro-Vite daily.  4. Os-Cal 500 mg t.i.d. before meals.  5. Hemodialysis medications: Ancef 2 g IV at hemodialysis through May 1;     Epogen increase to 4000 units hemodialysis, Calcitriol 1.25 mcg     hemodialysis.   HISTORY OF PRESENT ILLNESS:  Please see HPI for full details.  Briefly, The  patient is a 50 year old African-American female with past medical history  significant for end-stage renal disease on chronic hemodialysis every  Tuesday, Thursday, and Saturday at [facility].  The  patient presented to [facility] ED with two to three days of fever to 103  and chills. Blood cultures were obtained at the hemodialysis center day  prior to admission and were  growing gram-positive cocci.  She had received  a dose of vancomycin; however, she continued to have fever not relieved with  Tylenol.  Also of significance is that patient had a clotted A-V fistula in  her right arm, using a PermCath and right IJ.  This PermCath was suspected  to be the source of her infection.  She was admitted for further evaluation  and treatment.   ADMISSION LABORATORY DATA:  WBC 10.5, hemoglobin 10.9, hematocrit 32.4,  platelets 301.  Sodium 131, potassium 3.3, chloride 94, CO2 25, glucose 101,  BUN 23, creatinine 8.8, calcium 8.0, total protein  7.1, albumin 3.0.   PROCEDURES:  1. April 27, 2002: Right IJ dialysis catheter removed.  2. April 30, 2002, left shoulder x-ray normal.  3. April 30, 2002, Diatek catheter was inserted into left internal jugular     vein.  4. May 02, 2002, 2-D echocardiogram revealed normal LV function and no     vegetation.   HOSPITAL COURSE:  #1.  STAPHYLOCOCCUS AUREUS CONFIRMED ON CULTURES OBTAINED ON April 24, 2002  DURING HEMODIALYSIS:  The patient was initially continued on treatment with  vancomycin, tobramycin added for synergistic effect.  After receiving  vancomycin for 5 days, she was started on Ancef and Rifampin.  She continued  to have fever despite treatment, and an echocardiogram was obtained to rule  out endocarditis.  No vegetations were seen.   #2.  THROMBOPHLEBITIS:  The patient complained of left shoulder pain during  hospitalization and was found to have a superficial thrombophlebitis of the  left cephalic vein at the shoulder.  Her pain responded well to anti-  inflammatories and heating pad.   #3.  HYPERTENSION:  Blood pressure remained well controlled during  hospitalization.   #4.  END-STAGE RENAL DISEASE:  She was continued on hemodialysis every  Tuesday, Thursday, and Saturday.   #5.  ANEMIA:  Remained stable on Epogen.   DISPOSITION:  The patient was discharged to home in stable condition with  precautions.  She is to resume hemodialysis every Tuesday, Thursday, and  Saturday at [facility].                                                Tamika J. Lavonia Drafts, M.D.    Lorette Ang  D:  12/03/2002  T:  12/04/2002  Job:  [id number]

## 2010-06-04 NOTE — Op Note (Signed)
NAME:  [name], [name]                              ACCOUNT NO.:  [id number]   MEDICAL RECORD NO.:  [id number]                   PATIENT TYPE:  OIB   LOCATION:  2857                                 FACILITY:  [place]   PHYSICIAN:  Judeth Cornfield. Scot Dock, M.D.        DATE OF BIRTH:  [birth year]   DATE OF PROCEDURE:  08/12/2003  DATE OF DISCHARGE:                                 OPERATIVE REPORT   PREOPERATIVE DIAGNOSIS:  Chronic renal failure.   POSTOPERATIVE DIAGNOSIS:  Chronic renal failure.   PROCEDURES:  1. Ultrasound of bilateral internal jugular veins.  2. Placement of a right IJ Diatek catheter.  3. Placement of a right upper arm AV graft.   SURGEON:  Judeth Cornfield. Scot Dock, M.D.   ASSISTANT:  Hoyle Sauer A. Zigmund Gottron.   ANESTHESIA:  Local with sedation.   DESCRIPTION OF PROCEDURE:  The patient was taken to the operating room and  sedated by anesthesia.  The ultrasound scanner was used to identify both  internal jugular veins.  The neck and upper chest were then prepped and  draped in the usual sterile fashion.  After the skin was infiltrated with 1%  lidocaine, the right internal jugular vein was cannulated and a guide wire  introduced into the superior vena cava under fluoroscopic control.  Of note,  I had difficulty passing the J wire.  I therefore passed an end hole  catheter over the J wire and shot a venogram to show that the superior vena  cava was in fact patent. I was then able to pass an angled glide wire  through the end hole catheter and use a steering device to manipulate the  wire down into the right atrium. I then advanced the end hole catheter and  exchanged the glide wire for a J wire.  The tract over the wire was then  dilated and then a dilator and peel away sheath were passed over the wire  and the wire and dilator removed.  The catheter was passed through the peel  away sheath and positioned in the right atrium.  Next, the exit site for the  catheter was  selected and the skin anesthetized between the two areas.  The  catheter was then brought through the tunnel, cut to the appropriate length  and the distal ports were attached.  Both ports went through easily and were  then flushed with heparinized saline and filled with concentrated heparin.  The catheter was secured at its exit site with a 3-0 nylon suture.  The IJ  cannulation site was closed with a 4-0 subcuticular stitch. A sterile  dressing was applied.   Next, attention was turned to placement of an arm graft.  The patient  apparently had had a previously attempted upper arm fistula. A transverse  incision was made here and the old fistula was divided between 3-0 silk  ties.  This was chronically occluded. I then  dissected out the brachial  artery and controlled this with a vessel loop.  The adjacent brachial veins  were very small and I did not think she was a candidate for a forearm graft.  Next, a separate longitudinal incision was made beneath the axilla after the  skin was anesthetized and the high brachial vein was dissected free.  A 4 to  7 mm graft was then tunneled between the two incisions and the patient was  then heparinized.  The brachial artery was clamped proximally and distally  and a longitudinal arteriotomy was made.  The 4 mm end of the graft was  spatulated and sewn end to side to the artery using continuous 6-0 Prolene  suture.  It was then folded to be appropriate length for anastomosis to the  axillary vein.  The vein was ligated distally and spatulated proximally. It  was cut to the appropriate length, spatulated and sewn end to end to the  vein using continuous 6-0 Prolene suture.  At the completion there was an  excellent thrill in the graft.  Hemostasis was obtained in the wound. The  wound was closed with a deep layer of 3-0 Vicryl and the skin closed with 4-  0 Vicryl.  A sterile dressing was applied.  The patient tolerated the  procedure well and was  transferred to the recovery room in satisfactory  condition.  All needle and sponge counts were correct.                                               Judeth Cornfield. Scot Dock, M.D.    CSD/MEDQ  D:  08/12/2003  T:  08/12/2003  Job:  [id number]

## 2010-06-04 NOTE — Discharge Summary (Signed)
[place]  Patient:    [name], [name] Visit Number: [id number] MRN: [id number]          Service Type: DSU Location: [place] 01 Attending Physician:  Manuella Ghazi Dictated by:   Maia Plan, P.A. Admit Date:  09/25/2000 Discharge Date: 09/25/2000   CC:         [name]. Rise Patience, M.D.  [place]   Discharge Summary  ADMISSION DIAGNOSES: 1. Fever, rule out sepsis. 2. End-stage renal disease, on chronic hemodialysis. 3. Hypertension. 4. Iron-deficiency anemia. 5. Secondary hyperparathyroidism.  DISCHARGE DIAGNOSES: 1. Klebsiella pneumonia sepsis, felt catheter related. 2. Status post removal of Ash catheter and placement of a new left IJ Ash    catheter. 3. Unsuccessful attempt of peritoneal dialysis catheter placement secondary to    adhesions. 4. End-stage renal disease, on chronic hemodialysis. 5. Hypertension. 6. Iron-deficiency anemia. 7. Secondary hyperparathyroidism.  HISTORY OF PRESENT ILLNESS:  The patient is a 50 year old black female with end-stage renal disease, on chronic hemodialysis at the [facility] every Tuesday, Thursday, and Saturday.  Was admitted following dialysis with a temperature of 101 and hard shaking chills.  She has blood cultures drawn at the kidney center, and was treated with vancomycin and tobramycin. She denies coughing, dysuria, abdominal pain, nausea, vomiting, or diarrhea. She has an Ash catheter on the right which is suspicious for infection.  On the day of admission she received 1 g of vancomycin and 120 mg of tobramycin at the kidney center.  ADMISSION LABORATORY DATA:  White count 8200 with 89 segs.  Hemoglobin 11.4, platelets 229,000.  Sodium 137, potassium 4.2, chloride 99, CO2 30, glucose 82, BUN 16, creatinine 7.3, calcium 7.1, albumin 2.9.  Liver function tests within normal limits.  Chest x-ray showed no active disease.  HOSPITAL COURSE:  The  patient was admitted, placed on her usual antibiotics and vancomycin and tobramycin were continued.  She had her right Ash catheter removed on September 13, 2000.  Fevers defervesced over the next 24 hours, and she was afebrile thereafter.  On the second hospital day, gram negative rods were reported in blood cultures drawn at the kidney center.  On the third hospital day these were identified as Klebsiella pneumonia which was a pansensitive organism.  At that time, vancomycin and tobramycin were stopped, and she was switched to Ancef.  Dr. Rise Patience attempted to placed a PD catheter, but with exploration found that to be contrindicated due to the amount of adhesions the patient had.  He suggested reattempting in approximately one year.  She will be treated with Ancef 2 g IV after each dialysis through September 10.  New graft placement is scheduled for September 25, 2000, with CVTS.  The IJ Ash catheter placed September 15, 2000, by Dr. Donnetta Hutching functioned well on dialysis.  DISCHARGE MEDICATIONS: 1. Nephro-Vite one q.d. 2. Calcium 500 mg two with each meal. 3. Ancef 2 g IV after each dialysis through September 26, 2000.  New graft    scheduled for September 25, 2000, with Dr. Donnetta Hutching. 4. Calcijex 0.5 mcg IV each dialysis. 5. Epogen 2500 units IV each dialysis.  She was instructed to monitor temperatures at home b.i.d. Dictated by:   Maia Plan, P.A. Attending Physician:  Manuella Ghazi DD:  09/30/00 TD:  09/30/00 Job: [id number]

## 2010-06-04 NOTE — Consult Note (Signed)
[name], WINCHEL NO.:  [id number]   MEDICAL RECORD NO.:  [id number]          PATIENT TYPE:  INP   LOCATION:  5528                         FACILITY:  [place]   PHYSICIAN:  Kathrin Penner, M.D.   DATE OF BIRTH:  1960-03-07   DATE OF CONSULTATION:  06/25/2004  DATE OF DISCHARGE:                                   CONSULTATION   GENERAL SURGICAL CONSULTATION   DATE OF CONSULTATION:  June 25, 2004.   REASON FOR CONSULTATION:  Small bowel obstruction.   HISTORY OF PRESENT ILLNESS:  Ms. Corales is a 50 year old female with end stage  renal disease, hypertension and secondary hypoparathyroidism with remote  history of idiopathic pancreatitis.  She was admitted two days ago with a  decreased appetite, abdominal bloating, nausea and vomiting and diarrhea.  She continues to have diarrhea.  Labs on admission showed increased amylase  and lipase consistent with acute pancreatitis.  Her liver function tests  were normal.  X-rays showed developing partial small bowel obstruction or  even an early small bowel obstruction.  A CT scan showed inflammatory  changes of the right and transverse colon.  The patient has no history of  cholelithiasis.  We are consulted for the pancreatitis as well as the  partial small bowel obstruction.   ALLERGIES:  PENICILLIN, CODEINE, ACE INHIBITORS.   MEDICATIONS:  Intravenous Cipro, Hectorol, epoetin, Protonix, intravenous  vancomycin and calcium.   PAST MEDICAL HISTORY:  End stage renal disease.  Idiopathic pancreatitis.  Anemia.  Hypertension.  Secondary hyperparathyroidism.  ACE inhibitors cause  angioedema of the bowel.  Hyperlipidemia.   PAST SURGICAL HISTORY:  PD catheter in the past, also renal transplant.   SOCIAL HISTORY:  She lives in [place]. She is divorced. She has one  daughter.  She quit smoking in 2003.  No alcohol or illicit drug. She  follows a renal diet.   FAMILY HISTORY:  The patient is adopted.   REVIEW OF  SYMPTOMS:  Decreased appetite, nausea, vomiting, diarrhea and  abdominal pain.  She denies any chest pain or shortness of breath.  Her  review of systems are otherwise negative.   PHYSICAL EXAMINATION:  VITAL SIGNS:  99.8, pulse 84, respirations 18, blood  pressure 108/71, oxygen saturation 99% on room air.  GENERAL APPEARANCE:  She is in no acute distress.  HEENT:  Grossly normal.  Sclerae clear. Conjunctiva are normal.  Nares  without discharge.  NECK:  No carotid bruits, no subclavian bruits, no jugular venous distention  or thyromegaly.  HEART:  Regular rate and rhythm with no extra murmur, rub or ectopy.  LUNGS:  Clear to auscultation bilaterally.  No wheezing, rhonchi.  SKIN:  Warm and dry.  ABDOMEN:  Soft but distended.  There are faint bowel sounds present.  She is  diffusely tender.  There are scars just below the umbilicus to the right and  to the left which represent her former PD catheter scar as well as her renal  transplant.  EXTREMITIES:  No cyanosis or clubbing.  MUSCULOSKELETAL:  No joint deformity.  No  costovertebral angle tenderness.  NEUROLOGICAL:  Cranial nerves II-XII grossly intact.  She is alert and  oriented X3.   LABORATORY DATA:  Laboratory studies reveal sodium 138, potassium 4.3, BUN  30, creatinine 9.5, hemoglobin 11.8, hematocrit 34.8, white count 7.4, liver  function tests normal.  Amylase initially 231, now is 72.  Lipase initially  173, now 21.   ASSESSMENT/PLAN:  1.  Pancreatitis felt to be idiopathic.  2.  Ischemic colitis, now on intravenous Cipro.  3.  Partial small bowel obstruction.  The patient continues to have      diarrhea, will continue to follow the films.  She will need a      nasogastric tube if nausea and vomiting resume.  4.  End stage renal disease.  5.  Anemia.  6.  Hypertension.  7.  Secondary hyperparathyroidism.   The patient was seen and examined by Dr. Kathrin Penner.  We will continue  to follow the patient with you.   Again, x-rays will be performed in the  morning.       LB/MEDQ  D:  06/25/2004  T:  06/25/2004  Job:  [id number]   cc:   Youlanda Roys. Deatra Ina, M.D.  [address]  [place]  [place] [address]  Fax: [phone]   Louis Meckel, M.D.  [address]  [place]  [place] [address]  Fax: [phone]

## 2010-06-04 NOTE — H&P (Signed)
NAME:  [name], [name]                              ACCOUNT NO.:  [id number]   MEDICAL RECORD NO.:  [id number]                   PATIENT TYPE:  INP   LOCATION:  1823                                 FACILITY:  [place]   PHYSICIAN:  Maudie Flakes. Hassell Done, M.D.                DATE OF BIRTH:  20-Aug-1960   DATE OF ADMISSION:  07/16/2002  DATE OF DISCHARGE:                                HISTORY & PHYSICAL   HISTORY OF PRESENT ILLNESS:  This is a 50 year old black female with end-  stage renal disease secondary to hypertension/__________, on chronic  hemodialysis with prior renal transplant rejection in 1997, for symptoms of  anuria after complaints of acute onset of abdominal pain around 10 p.m. last  night with nausea, vomiting and diarrhea.  She had no relief with Phenergan  or Imodium last night.  She went to the hemodialysis today at the [facility] and was told by Dr. Merrie Roof to come to the ER if  she is still had problems.  She was given Imodium at the center.  Took her  last Phenergan and still had symptoms.  She was vomiting at the ER when  seen.  She denies any fevers or chills.  Yesterday said she was feeling fine  until about 10 p.m. when the symptoms started abruptly.  She denies melena,  denies dysuria, infections, anuria.  Normal bowel movements yesterday in the  a.m.   The patient states she had this previously before on admission in August  2003.   MEDICATIONS:  1. Nephro-Vite one daily.  2. Phenergan 25 mg q.6 h. p.r.n.  3. Xanax 0.25 mg q.12 h. p.r.n.  4. Quinine sulfate 325 mg q.i.d. p.r.n. cramps.  5. Allegra 60 mg q.12 h. p.r.n.   HEMODIALYSIS PRESCRIPTIONS PRESCRIBED BY THE [facility]:  1. Dry weight 81.0 kg.  2. EPO 2000 units.  3. Calcitriol 1.25 q. dialysis.  The patient dialyzes on a 2.0 per day     graph.  [facility] dialysis time is 2 hours 45 minutes.   ALLERGIES:  PENICILLIN and CODEINE which causes hives.   PAST MEDICAL  HISTORY:  1. End-stage renal disease with hemodialysis starting in April 1958.  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  2. Left nephrectomy in the past.  3. PD catheter was placed in the remote past with PD catheter planned on     being removed 2001.  4. Attempted to replace PD catheter but unable to because of loculated fluid     2002.  5. History of malignant hypertension with grand mal seizure in October 1999,     when the patient was admitted.  6. History with admission August 15-18, 2003, nausea, vomiting, diarrhea,     and abdominal pain thought to be secondary to gastroenteritis with     symptoms decreasing with antibiotics.  7. History of secondary hyperparathyroidism with parathyroidectomy 2002.  8. History of loculated fluid August 2001, with ultrasound-guided     paracentesis.  Culture was negative.  Treated with Flagyl with resolution     of loculated fluid on next CT scan checked.  9. History of pancreatitis by CT in December 2002.  10.      Remote endoscopy by Dr. Scarlette Shorts, August 1997, which was     normal.  Reason for this test unknown.  11.      Admission April 10-18, 2004, for Staphylococcus aureus bacteremia     secondary to right IJ Perm-cath infection with the Perm-cath removed and     replaced later on.   FAMILY MEDICAL HISTORY:  Father and mother both alive and healthy.   SOCIAL HISTORY:  The patient is divorced and lives with her parents in  [place].  Currently unemployed.  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  Denies any  alcohol.  The patient also says she has not had any cigarettes for the past  several months; trying to stop.   REVIEW OF SYSTEMS:  Decreased appetite since supper last p.m.  Bloating  abdominal appearance, the patient said started also last night.  Denies any  chest pain.  Denies any cough.  Denies any pain around her Perm-cath site.  The patient says she is supposed to get a new left upper arm AV [employer]-Tex  graft in the next week by  __________ as an outpatient.   PHYSICAL EXAMINATION:  GENERAL:  This is a black female complaining of lower  abdominal discomfort.  Alert with nausea and vomiting yellow bile fluid in  the ER.  VITAL SIGNS:  Temperature 97.8, blood pressure 143/112 and then retaken down  to 143/89, pulse 89, respirations 20.  HEENT:  Normocephalic.  Eyes:  PERLA.  Ears and nose grossly normal.  Throat:  No lesions noted.  NECK:  Supple.  No JVD.  LUNGS:  Clear.  HEART:  Presents with regular rate and rhythm.  Also S1, S2, S4.  No murmur  or rub heard.  ABDOMEN:  Bowel sounds are positive and normal.  Mildly distended with lower  quadrant tenderness with some rebound effect.  EXTREMITIES:  No pedal edema.  Left IJ Perm-cath.  No discharge.  Nontender.  NEUROLOGICAL:  Oriented x3.  Moves all extremities.  Cranial nerves II-XII  grossly intact with no focal defects noted.   LABORATORY DATA:  Abdominal film showed no acute disease.  No obstruction  but questionable air/fluid levels.  I discussed this with the radiologist  and with Dr. Hassell Done in the a.m.  A CT scan of the abdomen with rectal contrast  is pending.  The patient says she cannot drink p.o. contrast.   CBC shows WBC 7.9, hemoglobin 16.6, platelet count 240,000.  Potassium 5.2,  BUN 23, creatinine 1.7, glucose 97, bicarb 22, albumin 3.8, calcium 8.9.  Phosphorous pending.  Amylase 113, normal.  LFTs  were normal.   ASSESSMENT/PLAN:  1. Abdominal pain with nausea, vomiting and diarrhea.  Questionable etiology     of gastroenteritis, acute abdomen, irritable bowel syndrome, Clostridium     difficile colitis.  Will plan to check CT of the abdomen, check stools     for Clostridium difficile.  Check stool for ova and parasites.  Phenergan     p.r.n. and p.r.n. morphine sulphate.  Plan clear liquids for now.  2. End-stage renal disease on hemodialysis Tuesday, Thursday, Saturday.    After the patient  was finished today, noted mild elevated potassium  at     5.2.  Will recheck a potassium stat in the ER when her intravenous is     being restarted.  The patient will have hemodialysis via a Perm-cath.     Currently, Perm-cath does not appear infected.  Will check blood cultures     to be complete, however.  3. History of anemia.  Recheck CBC in the a.m.  Currently is 16.6;     questionable that this is an element of dehydration with vomiting.  Will     need to decrease her EPO dose or hold her EPO if hemoglobin is high.  4.     Hypertension.  Currently coming down.  Will give p.r.n. clonidine if needed      and hemodialysis also.  5. History of secondary hyperparathyroidism.  Will continue Calcitriol on     hemodialysis.  Check a phosphorus.  Currently, the patient is not on any     phosphate binders.     Foye Clock, P.A.-C                  Maudie Flakes. Hassell Done, M.D.    DWZ/MEDQ  D:  07/16/2002  T:  07/16/2002  Job:  [id number]   cc:   [place]    cc:   [facility]

## 2010-06-04 NOTE — Discharge Summary (Signed)
   NAMEJASNOOR, [name]                              ACCOUNT NO.:  [id number]   MEDICAL RECORD NO.:  [id number]                   PATIENT TYPE:  INP   LOCATION:  5004                                 FACILITY:  [facility]   PHYSICIAN:  Windy Kalata, M.D.          DATE OF BIRTH:  11/03/1960   DATE OF ADMISSION:  11/28/2002  DATE OF DISCHARGE:  12/02/2002                                 DISCHARGE SUMMARY   DISCHARGE DIAGNOSES:  1. Acute pancreatitis.  2. End-stage renal disease on hemodialysis.  3. Hypertension.  4. Hyperlipidemia.   DISCHARGE MEDICATIONS:  1. Lisinopril 5 mg daily.  2. Lipitor 10 mg q.h.s.  3. Os-Cal 500 mg t.i.d. with meals.  4. Nephro-Vite one tablet daily.  5. Protonix 40 mg daily.  6. Darvocet 50/325 two tablets q.4-6h. p.r.n. pain, #20 given.   DISCHARGE INSTRUCTIONS AND FOLLOW UP:  The patient was instructed to return  to [facility] on Tuesday, Thursday, and Saturday for dialysis, and  will resume all previous orders, except decrease her estimated dry weight to  78.5 kg.   HOSPITAL COURSE:  This 50 year old African-American female with history of  pancreatitis in 2002 of unknown etiology presented to the emergency room at  [facility] with one day of nausea, vomiting, and diarrhea, with crampy  abdominal pain.  Amylase and lipase were elevated with a lipase of 99, and  the patient was admitted for management of acute pancreatitis.  The patient  was made NPO and given IV Phenergan, as well as Protonix, and over  hospitalization her abdominal pain slowly improved, and she was tolerating a  regular renal diet on the day of discharge with minimal abdominal pain and  no tenderness to palpation.  Her lipase went from 99 down to 24.  She was  dialyzed on Saturday, and will continue her Tuesday, Thursday, and Saturday  schedule as an outpatient.  Also, the patient did have a temperature to  100.0 during hospitalization.  Blood cultures were obtained, which  remain  negative to date, and she was afebrile after that without starting any  antibiotics.   LABORATORY DATA:  On December 01, 2002, sodium was 138, potassium 3.6,  chloride 99, bicarb 31, glucose 73, BUN 14, creatinine 8.8, and calcium 8.7.  LFT's were within normal limits.      Sallyanne Havers, M.D.                       Windy Kalata, M.D.    AS/MEDQ  D:  12/02/2002  T:  12/02/2002  Job:  [id number]   cc:   [facility]

## 2010-06-04 NOTE — Op Note (Signed)
[name]. [facility]  Patient:    [name], [name]                             MRN: [id number] Proc. Date: 08/19/99 Adm. Date:  [id number] Attending:  Franchot Erichsen CC:         Windy Kalata, M.D.   Operative Report  PREOPERATIVE DIAGNOSIS:  Peritonitis secondary to continuous ambulatory peritoneal dialysis catheter.  POSTOPERATIVE DIAGNOSIS:  Peritonitis secondary to continuous ambulatory peritoneal dialysis catheter.  OPERATIONS: 1. Removal of double-cuff continuous ambulatory peritoneal dialysis catheter. 2. Mini laparotomy.  SURGEON:  [name]. Rise Patience, M.D.  ASSISTANT:  Nurse.  ANESTHESIA:  General.  INDICATIONS:  [name] is a 50 year old black female who has been a long-term renal failure patient and has been on peritoneal dialysis.  Over the last several months, she has had recurring problems with peritonitis.  She was readmitted yesterday.  She has completed her oral antibiotics not too many weeks previously.  She was found to have turbid fluid with an elevated cell count and diffuse abdominal tenderness.  This was thought to be secondary to a catheter-related infection.  She previously had been on Cipro.  This time, she was started on vancomycin intravenously and also [place].  The patients abdomen is diffusely tender, but it is thought that this is a catheter-related problem as she has not had an intra-abdominal problems as far as appendicitis, peritonitis, known gallstones, or diverticulitis.  DESCRIPTION OF PROCEDURE:  The patient was taken to the OR suite with induction of general anesthesia and endotracheal tube positioned.  The abdomen was prepped with Betadine surgical scrub and solution and draped in a sterile manner.  A small incision where the catheter had been placed was made.  Sharp dissection through the skin and subcutaneous tissues and the catheter identified where it goes into the right rectus fascia.  The  catheter was divided so I could use the internal portion as a handle.  Then the internal cuff was free from the posterior rectus muscle and posterior rectus fascia and then opened into the peritoneal cavity.  The peritoneal fluid was aspirated. This of course is dialysate.  Then the catheter was completed freed.  I made a little larger incision so I could peak into the abdomen.  The small intestine and colon sort had a thickened peritoneum like you see with CAPD, but on palpation of the lower abdomen I cannot feel any inflammatory portion, maybe a few ticks on the colon, but nothing that looked like a segment of diverticulitis.  The left tube was visible in the very hyperemic fons, but nothing like a salpingitis.  On the right, I could see the right tube and it was also the same.  I could not actually visualize the appendix, but I could not feel any inflammatory reaction in the right lower quadrant.  I could not see or visualize the gallbladder.  With nothing obviously the source of this recurrent infection, it is mostly likely catheter related.  The posterior rectus fascia was closed with a running 2-0 Vicryl.  The rectus muscle was approximated with a similar stitch.  Then the anterior rectus fascia was closed with interrupted sutures of 0 Prolene.  The subcutaneous tissues were closed with 3-0 chromic and then 3-0 nylon simple sutures through the skin. Next, I removed the external cuff, removing a little cuff of skin.  This looked like kind of a little chronic  irritation and inflammation, but not that of an active infection of the abdominal wall.  The little exit site was packed with Betadine soaked with gauze 4 x 4 after the hemostasis had been obtained with cautery.  She will continue on the broad antibiotic coverage and hopefully the peritonitis will subside fairly appropriately.  The patient hopes to resume peritoneal dialysis at a later date and we will need to plan on placing this  in the left lower quadrant as it is hoped that she can get another kidney transplant in the right upper quadrant some time in the future. DD:  08/19/99 TD:  08/21/99 Job: [id number]

## 2010-06-04 NOTE — Op Note (Signed)
[name], [name] NO.:  [id number]   MEDICAL RECORD NO.:  [id number]          PATIENT TYPE:  OIB   LOCATION:  2899                         FACILITY:  [facility]   PHYSICIAN:  Judeth Cornfield. Scot Dock, M.D.DATE OF BIRTH:  06-30-1960   DATE OF PROCEDURE:  08/10/2004  DATE OF DISCHARGE:                                 OPERATIVE REPORT   PREOPERATIVE DIAGNOSIS:  Chronic renal failure with clotted right upper arm  AV graft.   POSTOPERATIVE DIAGNOSIS:  Chronic renal failure with clotted right upper arm  AV graft.   PROCEDURE:  Thrombectomy of right upper arm AV graft with intraoperative  fistulogram.   SURGEON:  Judeth Cornfield. Scot Dock, M.D.   ASSISTANT:  Tanya Nones, RNFA.   ANESTHESIA:  Local with sedation.   TECHNIQUE:  The patient was taken to the operating room, sedated by  anesthesia. The right upper extremity was prepped and draped in the usual  sterile fashion. After the skin was anesthetized with consent lidocaine, the  incision in the axilla was opened. This just been done two days ago at which  time the graft was revised. The graft was divided after the patient was  heparinized and graft thrombectomy easily achieved using a #4 Fogarty  catheter. The arterial plug was clearly retrieved and the graft was flushed  with heparinized saline clamp. There is some slight irregularity in pulling  the catheter through the body of graft but no stenosis that I could tell.  Venous thrombectomy was performed and the venous anastomosis was widely  patent. The graft was sewn back end-to-end with continuous 6-0 Prolene  suture. At the completion, there was an excellent thrill. I did shoot an  intraoperative fistulogram and there was a very small amount of retained  clot near the venous end and I elected to retrieve this.  The graftotomy was  opened again and the clot retrieved easily.  There was excellent inflow.  The graft was flushed with heparinized saline and clamp. The  graft was then  sewn back end-to-end with continuous 6-0 Prolene suture. There is an  excellent thrill. Hemostasis was obtained in the wound and the wound was  closed with deep layer of 3-0 Vicryl. The skin closed with 4-0 Vicryl.  Sterile dressing was applied. The patient tolerated procedure well and was  transferred to recovery room in satisfactory condition. All needle and  sponge counts were correct.       CSD/MEDQ  D:  08/10/2004  T:  08/10/2004  Job:  [id number]

## 2010-06-04 NOTE — Discharge Summary (Signed)
[place]  Patient:    [name], [name]                             MRN: [id number] Adm. Date:  [id number] Disc. Date: 09/10/99 Attending:  Alex Gardener Dictator:   Bertha Stakes, P.A. CC:         [place].   Discharge Summary  DISCHARGE DIAGNOSES: 1. Abdominal pain, nausea, and vomiting, all cultures negative, questionable    etiology. 2. End-stage renal disease. 3. History of recurrent peritonitis. 4. Diarrhea.  BRIEF HISTORY AND REASON FOR ADMISSION:  [name] is a 50 year old African-American female with end-stage renal disease who was admitted for fevers, nausea, abdominal pain, and watery diarrhea.  Her history is relative in that she was hospitalized on August 18, 1999, through August 23, 1999, with persistent Enterobacter cloacae peritonitis, having been treated previously as an inpatient from July 16, 1999, through July 24, 1999, and then on outpatient antibiotics on July 24, 1999, through August 15, 1999.  She had her peritoneal dialysis catheter removed on August 19, 1999.  An Ash catheter was placed at that time and she was started on hemodialysis.  She did not have a CT done during the August admission, however, she did during her initial presentation with gram-negative peritonitis on July 19, 1999, and this was negative for an abscess.  Since her discharge on August 23, 1999, she has had continued problems with anorexia and nausea.  She has had low-grade fevers since discharge and while taking Cipro, which was completed on August 31, 1999. Temperatures were in 99.1-99.3 degrees range.  She completed Cipro on August 31, 1999.  Since that time, temperatures have persisted as high as 100.1 degrees.  She is also having frequent yellow, watery, diarrheal stools with increase in lower abdominal pain.  The blood pressure was found to be 163/106 with a temperature of 98.6 degrees.  The patient reports that  her temperature had been 100.5 degrees earlier at home.  The abdomen was mildly distended with bowel sounds hypoactive.  She had moderately diffuse lower abdominal tenderness without rebound.  A three-way of the abdomen showed a clear clear, a non-obstructive bowel gas pattern, and no evidence of free air. She was admitted for further work-up of her abdominal pain and fever and started empirically on Flagyl and Cipro.  Her stools were checked for Clostridium difficile.  TPA was placed in her catheter which was previously malfunctioning.  HOSPITAL COURSE:  A CT of the abdomen and pelvis was obtained, which showed increased loculated fluid collections in the right upper quadrant and in the pelvis.  There was extensive fluid in the pelvis increased since the prior examination.  It also showed peripheral enhancement of the fluid collections, suggestive of infection.  She continued to have low-grade fevers from 99-100 degrees.  In addition, she was started on Imodium and Flagyl for her diarrhea.  Her symptoms improved with a day of receiving the Imodium.  She had no further complaints of diarrhea during her hospitalization.  A general surgery consult was obtained by [name]. Rise Patience, M.D., who has worked with [name] many times in the past.  He recommended an ultrasound-guided aspiration of the fluid collections in her abdomen.  He did feel that laparotomy would be warranted at this time.  An ultrasound-guided aspiration of the collections was performed on September 08, 1999, by radiology.  The fluid aspirated  was not grossly purulent.  It was sent for Grams stain and culture, both of which were negative.  [name] nausea and vomiting and abdominal pain lessened somewhat during her hospitalization, although she would continue to have fevers as high as 100.5 degrees despite antibiotic therapy with Flagyl and Cipro.  She was able to tolerate small meals without nausea or vomiting. The  patients care was discussed with [name]. Rise Patience, M.D., and was felt that the patient could be discharged off of all antibiotics since there was no specific organism that had been cultured.  It was recommended that she record her temperatures daily and that if she had any high-grade temperatures, greater than 100.5 degrees, that she would return for further work-up.  In addition, she would be on p.o. Reglan and having six small meals per day.  She will follow up with her regularly scheduled hemodialysis via her right IJ Ash catheter at the [facility] after discharge.  DISCHARGE MEDICATIONS: 1. Nephro-Vite one p.o. q.d. 2. Reglan 5 mg one p.o. q.h.s. and a.c. 3. Labetalol 200 mg one p.o. b.i.d. 4. ProMod powder one scoop b.i.d., add to meals and snacks. 5. Imodium AD one to two pills q.6h. p.r.n. diarrhea.  DISCHARGE INSTRUCTIONS:  She will discontinue her calcium as her phosphorus level in the hospital was 4.0.  [name] is to follow an 80 g protein, 2 g sodium, 2 g potassium, renal failure diet with five cup fluid restriction per day.  She is to follow up on Tuesday, Thursday, and Saturday at the [facility].  The estimated dry weight at the time of discharge is 55.0 kg.  She will check temperatures b.i.d. at home.  If her temperature is greater than 100.5 degrees, she will contact the kidney center for further instructions.  DISPOSITION:  Discharged to home.  CONDITION ON DISCHARGE:  Stable. DD:  10/15/99 TD:  10/15/99 Job: [id number]

## 2010-06-04 NOTE — Discharge Summary (Signed)
NAMEKANDE, [name]   MEDICAL RECORD NO.:  [id number]                    PATIENT TYPE:   LOCATION:                                       FACILITY:   PHYSICIAN:  Rita R. Kilroy, P.A.                DATE OF BIRTH:   DATE OF ADMISSION:  08/31/2001  DATE OF DISCHARGE:  09/02/2001                                 DISCHARGE SUMMARY   ATTENDING PHYSICIAN:  Sol Blazing, M.D.   ADMITTING DIAGNOSIS:  1. Nausea, vomiting, diarrhea, and abdominal pain.  2. End-stage renal disease, on chronic hemodialysis.  3. Hypertension.  4. Anemia of chronic disease.  5. Status post parathyroidectomy.   DISCHARGE DIAGNOSES:  1. Nausea, vomiting, diarrhea, and abdominal pain secondary to     gastroenteritis, improved on antibiotics.  2. End-stage renal disease, on chronic hemodialysis.  3. Hypertension.  4. Anemia of chronic disease.  5. Status post parathyroidectomy.   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 until 9  a.m., with nausea, vomiting, diarrhea, and abdominal pain.  The symptoms  started approximately at 9 p.m. on the night before, with intractable nausea  and vomiting, with diarrhea every hour, but no obvious blood loss.  She had  diffuse abdominal pain in the upper quadrants, greater than the lower  quadrants with cramping.  She otherwise felt well until her symptoms  started.  She reports eating at [employer] at approximately 3 p.m.  Her last  travels were to the beach approximately two weeks ago.  She reports having  chills and myalgias but no fevers.  At the time of admission from the emergency room she was afebrile with a  white count of 9000, and no left shift.  Her hemoglobin  was 11.6, amylase  154, lipase 50, alkaline phosphatase 43, and liver function tests within  normal limits.  Her pregnancy test was negative.  Her chest x-ray showed no  active disease.  Prior to seeing her, she was treated with IV Pepcid and  Phenergan, and given a GI cocktail, IV fluids, and two Vicodin.  She  improved and therefore was sent home to continue on Phenergan suppositories  and Vicodin p.r.n.  At home she has continued to have nausea, vomiting,  diarrhea, abdominal pain, chills, myalgias, and returns to the emergency  room for admission.   LABORATORY DATA:  On admission white count 13,300 with 94 segs, 4  lymphocytes, 2 monocytes, and 1 eosinophil, hemoglobin 13.4, platelets  274,000.  Sodium 136, potassium 4.7, chloride 94, CO2 of 27, glucose 124,  BUN 30, creatinine 9.8.  Calcium 9.9, albumin 3.9, amylase 231,  lipase 96.  Liver function tests and bilirubins within normal limits.   HOSPITAL COURSE:  The patient was placed on GI rest, Protonix, and a CT of  the abdomen and pelvis showed thickened intestinal walls and question of  some obstruction.  She was being treated with Cipro and Flagyl empirically.  A CT was repeated using rectal contrast to specifically look at the cecum  and appendix, and these were both normal.  It did show mild diffuse small  bowel thickening, consistent with enteritis, versus ileus.  On antibiotics  and GI rest, the patient improved.  Her abdominal pain defervesced.  She was  tolerating a regular diet.   DISPOSITION:  She was discharged home, to complete three more days of Cipro.  It was felt that her symptoms were secondary to gastroenteritis with small  bowel thickening diffusely.   DISCHARGE MEDICATIONS:  She was instructed to resume her home medicines and  additionally take Cipro 500 mg for three more days.  1. Nephro-Vite one q.d.  2. Calcium carbonate 500 mg one with meals.  3. Cipro 500 mg q.d. x3 more days.  4. Protonix 40 mg  q.d.                                                  Nonah Mattes, P.A.    RRK/MEDQ  D:  09/30/2001  T:  10/01/2001  Job:  [id number]   cc:   Judeth Horn III, MD  [address], [place] [address]  Fax: [place]

## 2010-06-04 NOTE — Discharge Summary (Signed)
NAMEJARIELIS, MARLEY                              ACCOUNT NO.:  [id number]   MEDICAL RECORD NO.:  [id number]                   PATIENT TYPE:  INP   LOCATION:  5506                                 FACILITY:  [facility]   PHYSICIAN:  Vianne Bulls, M.D.                      DATE OF BIRTH:  Dec 29, 1960   DATE OF ADMISSION:  03/11/2002  DATE OF DISCHARGE:  03/16/2002                                 DISCHARGE SUMMARY   DISCHARGE DIAGNOSES:  1. 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  Patient     gets hemodialysis Tuesday, Thursday, Saturday at the [facility].  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  2. Hypertension.  3. Anemia.  4. Secondary hyperparathyroidism, status post parathyroidectomy.   PROCEDURES:  The patient had her left arm fistula graft clot during her  admission.  Thrombectomy was attempted but was not successful.  A right IJ  venous Ash catheter was placed.  She also had a right upper arm AV fistula  placed as well.  This was done by CVTS.   CONSULTATIONS:  CVTS as stated above.   LABORATORY DATA:  1. CBC at discharge showed a white blood cell count of 10.5, hemoglobin     11.9, hematocrit 35, platelet count of 290.  2. Differential on CBC at admission showed 86% neutrophils, 8% lymphocytes,     5% monocytes with an absolute neutrophil count of 12.3.  3. Hemoccult blood x1 was negative.  4. Complete Metabolic Panel at discharge showed a sodium of 132, potassium     4.5, chloride 94, CO2 21, glucose 80, BUN 45, creatinine 14.5, calcium     8.4.  5. LFT's at discharge showed an AST 14, ALT of less than 8.  Alkaline     phosphatase 40, total bilirubin 0.8, amylase 96, lipase 19.  6. Lipid panel showed a total cholesterol  243.  Triglycerides 294, HDL 31,     LDL 153.  7. EKG showed normal sinus rhythm with a heart rate of 76 beats/min.  8. Acute abdominal x-rays showed no evidence of acute pulmonary  process.  No     evidence of bowel obstruction or perforations.  Left renal enlargement.   HOSPITAL COURSE:  Gastroenteritis.  This is a 50 year old African American  female with history of end-stage renal disease secondary to chronic  glomerulonephritis, failed renal transplant, failed peritoneal dialysis, who  presented to the emergency department on March 11, 2002 with complaints  of vomiting and diarrhea all day.  She also had abdominal cramping.  It was  possible that she had some suspect [place] food the day before.  Denied any  recent travel or antibiotic usage.  She was found not to be orthostatic by  vital signs, however, she did have diffuse  tenderness to palpation of her  abdomen on admission.  She as therefore admitted, placed on clear liquid  diet and treated symptomatically with Phenergan and Toradol.  Over the next  few days, the patient's nausea and vomiting improved.  Her diet was advanced  to solids.  The patient's symptoms resolved by the time of discharge.  However, the patient's dialysis schedule was unable to be maintained  secondary to a clotted left arm fistula clot.  CVTS was then consulted, who  placed a right IJ Ash catheter as well as a right upper arm AV fistula.  She  was discharged after hemodialysis.   MEDICATIONS:  1. Nephro-Vite daily.  2. Calcium carbonate 500 mg q meal.  3. Phenergan 25 mg suppository q.6h. as needed for nausea.  4. Tylox  two tablets q.4h. as needed for pain.  5. Epogen 1000 units q dialysis.  6. Calcitrol 1.25 mcg q dialysis.  7. Estimated dry weight was 80 kg.   ACTIVITY:  No restrictions.   DIET:  She is to maintain a renal diet, 80/2/2.   WOUND CARE:  Not applicable.   SPECIAL INSTRUCTIONS:  She is to continue with dialysis at [facility] as usual on Tuesday, Thursday, Saturday.  They are to check  her potassium beforehand.   FOLLOWUP:  Dialysis as stated above.                                                  Vianne Bulls, M.D.    DV/MEDQ  D:  07/24/2002  T:  07/25/2002  Job:  [id number]

## 2010-06-04 NOTE — Op Note (Signed)
   NAME:  [name], [name]                              ACCOUNT NO.:  [id number]   MEDICAL RECORD NO.:  [id number]                   PATIENT TYPE:  INP   LOCATION:  5019                                 FACILITY:  [place]   PHYSICIAN:  Rosetta Posner, M.D.                 DATE OF BIRTH:  20-Apr-1960   DATE OF PROCEDURE:  04/30/2002  DATE OF DISCHARGE:                                 OPERATIVE REPORT   PREOPERATIVE DIAGNOSIS:  End-stage renal disease.   POSTOPERATIVE DIAGNOSIS:  End-stage renal disease.   OPERATION/PROCEDURE:  Placement of left IJ Diatek catheter.   ASSISTANT:  Nurse.   ANESTHESIA:  MAC.   COMPLICATIONS:  None.   DISPOSITION:  The patient to the recovery room with chest x-ray pending.   DESCRIPTION OF PROCEDURE:  The patient was taken to the operating room,  placed in the supine position where the area of both internal jugular veins  were imaged with ultrasound.  The patient had patent jugular veins  bilaterally.  The patient was placed in the Trendelenburg position and the  area of both sides of her right and left neck and chest were prepped and  draped in the sterile fashion.  With the patient in the Trendelenburg  position, using local anesthesia and the finder needle, the left internal  jugular vein was entered.  Next, using the Seldinger technique, the guide  wire was passed down to the level of the right atrium.  A dilator was passed  over this and then a larger dilator and peel-away sheath was passed over the  guide wire.  The dilator and the guide wire were removed and the Diatek  catheter was positioned at the level of the right atrium.  A separate  incision was made using the local anesthesia at the exit site of the  catheter and a tunnel was created from this site to the entry site of the  jugular vein.  The catheter was brought through this tunnel.  The two lumen  ports were attached to the catheter and both lumens flushed and aspirated  easily.  The catheter  was secured to the skin with 3-0 nylon stitch and the  entry site was closed with a 4-0 subcuticular Vicryl stitch.  The catheter  was locked with 1000 units/cc of heparin.  The catheter was dressed in a  sterile fashion.  The patient was taken to the recovery room where a chest x-  ray is obtained.                                                Rosetta Posner, M.D.    TFE/MEDQ  D:  04/30/2002  T:  04/30/2002  Job:  [id number]

## 2010-06-04 NOTE — Op Note (Signed)
[facility]  Patient:    [name], [name]                             MRN: [id number] Proc. Date: 04/10/00 Adm. Date:  [id number] Attending:  Allyn Kenner CC:         Maudie Flakes. Hassell Done, M.D.   Operative Report  [place] #[id number].  PREOPERATIVE DIAGNOSIS:  Secondary hyperparathyroidism due to end-stage renal failure.  POSTOPERATIVE DIAGNOSIS:  Secondary hyperparathyroidism due to end-stage renal failure.  PROCEDURE:  Total parathyroidectomy and autotransplantation of parathyroid tissue to the left brachioradialis muscle.  SURGEON:  Jaci Carrel, M.D.  ASSISTANT:  Sammuel Hines. Daiva Nakayama, M.D.  ANESTHESIA:  General endotracheal.  DESCRIPTION OF PROCEDURE:  Under adequate general endotracheal anesthesia, the patients neck was extended over a shoulder roll and prepared and draped in the usual fashion.  A low collar incision was made, carried down through the platysma muscle.  Superior and inferior platysmal flaps were developed using Bovie electrocoagulation.  Next, the middle cervical fascia was exposed and opened in the midline longitudinally, separating the strap muscles.  The strap muscles were then divided in a superior position using Bovie electrocoagulation in order to have maximum exposure.  The anterior jugular veins were divided over hemostats and ligated with 2-0 silk when the strap muscles were divided.  A Mahorner self-retaining retractor was inserted.  The left side was approached first.  Almost immediately we saw a very, very large left inferior parathyroid gland that was partially in the left anterior superior mediastinum.  It was dissected out over small hemoclips after dissecting out the left recurrent laryngeal nerve and making sure that it was free of any injury.  The left inferior gland was 2.5 cm in length, 2.0 cm in width, and 1.2 cm in thickness.  A portion was sent for frozen section study, which confirmed  parathyroid tissue, and remainder was saved in iced saline.  Next, there was a small nodule just superior to the inferior thyroid artery and just above the recurrent laryngeal nerve that was approximately a centimeter in diameter.  It had the appearance of either a lymph node, parathyroid gland, or thyroid nodule.  It was dissected out and excised over hemostats, which were ligated with 4-0 silk ligatures.  It was sent for frozen section study, which confirmed lymph node.  Next, we explored the left upper pole of the thyroid gland and found the left superior gland in an extreme posterior position, and also it was intrathyroidal.  The posterior aspect of it could be seen, but the anterior aspect was within the thyroid parenchyma.  It was dissected out of the thyroid gland over small hemoclips and a suture ligature of 4-0 Vicryl.  It was excised and found to be 1.6 cm in length, 1.0 cm in width, and 0.6 cm in thickness.  A portion was sent for frozen section study, which confirmed parathyroid tissue, and the remainder was saved in iced saline.  Having found two glands on the left side and removed them, we then removed to the right side of the neck.  The right inferior gland was in its normal position just below the inferior thyroid artery.  The recurrent laryngeal nerve was identified and spared of any injury.  The right inferior gland was dissected out fairly easily, and its blood supply was divided over small hemoclips.  It was 1.5 cm in length, 1.3 cm in  width, and 0.7 cm in thickness.  A portion was sent for frozen section study, which confirmed, and the remainder was saved in iced saline.  The right superior gland was then found also.  It was quite large.  It was quite posterior but not intrathyroidal.  It was lying on the cervical spine and lying medial to the inferior thyroid artery.  It was dissected out and brought into a position so that it was anterolateral to the inferior  thyroid artery.  Its attachments were divided with Bovie electrocoagulation, and its blood supply was clipped with small endoclips.  It was 2.1 cm in length, 1.5 cm in width, and 0.8 cm in thickness.  A portion was sent for frozen section study, and the remainder was saved in iced saline.  Having found four glands that were quite large and seeing the superior anterior mediastinum from both sides, we elected to close the neck.  The sternohyoid muscles were closed with interrupted sutures of 4-0 Vicryl and then reapproximated in the midline with the same suture.  Next, the platysma muscle was reapproximated with interrupted sutures of 4-0 Vicryl, and the skin was approximated with generic skin stapler 35-W.  Sterile dressing was applied.  Next, the left forearm was prepared and draped in the usual fashion for autotransplantation.  A 5 cm long longitudinal incision was made overlying the left brachioradialis muscle.  The left brachioradialis fascia was exposed over an area of approximately 5 x 5 cm, and the wound was kept open with a Weitlaner.  Next, the right inferior parathyroid gland was taken out of the ice bath and a portion of it was diced into 12 small pieces, which were 2-3 mm in diameter.  Each piece was placed into an intramuscular pocket in the brachioradialis muscle.  Each individual pocket was closed with a single suture of 4-0 Prolene.  Twelve pockets were created, and 12 pieces were autotransplanted.  At the conclusion, hemostasis was ascertained.  Skin was approximated with the generic skin stapler 35-W, and a sterile dressing was applied along with an Ace wrap.  The patient tolerated the procedures well and left the operating room in satisfactory condition. DD:  04/10/00 TD:  04/11/00 Job: [id number]

## 2010-06-04 NOTE — Discharge Summary (Signed)
NAMENAVEE, PERCY NO.:  [id number]   MEDICAL RECORD NO.:  [id number]          PATIENT TYPE:  INP   LOCATION:  5528                         FACILITY:  [facility]   PHYSICIAN:  Windy Kalata, M.D.DATE OF BIRTH:  13-Nov-1960   DATE OF ADMISSION:  06/23/2004  DATE OF DISCHARGE:  06/28/2004                                 DISCHARGE SUMMARY   DISCHARGE DIAGNOSES:  1.  Ischemic colitis with pancreatitis component to kidney/resolving.  2.  End-stage renal disease.  3.  Anemia of chronic disease.   PROCEDURES:  1.  June 24, 2004, abdominal CT with contrast:  Impression is inflammatory      changes in the right and to a lesser degree transverse colon-probable      diverticulitis but I cannot rule out ischemic colitis or infectious      colitis.  2.  June 24, 2004, pelvis CT with contrast:  Impression is 1) left colon      diverticulosis without definite diverticulitis; 2) normal appendix.  3.  June 25, 2004, abdominal ultrasound:  Impression is small echogenic and      cystic kidneys consistent with chronic dialysis. No acute findings by      abdominal ultrasound.  4.  June 28, 2004, CT angiography of the abdomen:  Impression is interval      improved appearance when compared to recent prior CT examination of the      small bowel and colon. The inflammatory process of the colon has      resolved and small bowel demonstrates less dilation. There is still some      mild wall thickening and enhancement in small bowel. I do not see any      evidence for ischemic processes with a relative normal appearance of the      aorta and vessels. There are calcifications of the right renal artery      ostium. The SMV is patent.  5.  Hemodialysis.   HISTORY OF PRESENT ILLNESS:  This is a 50 year old black female with end-  stage renal disease who complains of acute onset of diarrhea, then nausea  and vomiting followed by crampy midabdominal pain last evening. This is  similar pain  she had when she had pancreatitis in 2002 and again in 2004.  She denies any alcohol use. She presented to the emergency room, was found  to have normal liver function tests, and lipase was elevated at 173.  Abdominal x-ray showed some air-fluid levels in the small bowel. She had an  extensive workup in the past for history of pancreatitis with CT scans,  ultrasound, ERCP, and capsule endoscopy all of which have been unrevealing.   LABORATORY DATA:  Sodium 138, potassium 3.7, bicarbonate 27, creatinine 9.2.  Liver function tests were completely normal. Hemoglobin 14, white count 12.  Lipase was 73, amylase 231.   HOSPITAL COURSE:  Problem 1:  ISCHEMIC COLITIS WITH PANCREATITIS  COMPONENT/RESOLVING:  The patient was admitted and kept NPO to rest her  bowel. She was given IV Phenergan as well as IV morphine  sulfate for pain.  Routine abdominal x-rays were performed and on June 24, 2004 there was some  slight worsening of a possible small bowel obstruction. Ultrasound was  normal. The patient was started on vancomycin as well as Cipro for possible  findings on CT of diverticulitis; however, vancomycin 1 cc two days later  and IV Cipro was continued. The patient was able to advance her diet as  appropriate. An abdomen two-view was performed prior to discharge which  showed no significant change and small bowel distension consistent with low  grade partial small bowel obstruction but no free air. Finally a CT  angiography of the abdomen was performed with impression above. The patient  was able to tolerate a full diet at time of discharge.   Problem 2:  END-STAGE RENAL DISEASE:  The patient underwent her usual course  of hemodialysis without intervention. It was important to watch her blood  pressure on dialysis secondary to probable ischemic colitis and this was  done.   Problem 3:  ANEMIA OF CHRONIC DISEASE:  The patient continued on her usual  course dose of Epogen. Her hemoglobin did  remain within goal during this  hospitalization.   DISCHARGE MEDICATIONS:  1.  Aspirin 81 milligrams q.d.  2.  Lipitor 10 milligrams q.d.  3.  Nephro-Vite one p.o. q.d.  4.  Cipro 500 milligrams one in the morning x6 days.  5.  Protonix 40 milligrams 1/2 tablet every day.   DISCHARGE INSTRUCTIONS:  The patient has been fully discussed with her  medications. She will do diet as tolerated using her renal diet restriction.  She will be followed at the [facility]. There will be no  change to her hemodialysis prescription at this time other than to watch her  blood pressures and keep her systolic above 90.      Mi   MY/MEDQ  D:  08/26/2004  T:  08/27/2004  Job:  [id number]   cc:   [facility]   Windy Kalata, M.D.  [place]  [place] [address]  Fax: [phone]   Kathrin Penner, M.D.  [id number] [place]  [place] [address]

## 2010-06-04 NOTE — Op Note (Signed)
[name], [name] NO.:  [id number]   MEDICAL RECORD NO.:  [id number]          PATIENT TYPE:  EMS   LOCATION:  MAJO                         FACILITY:  [facility]   PHYSICIAN:  Jessy Oto. Fields, MD  DATE OF BIRTH:  12-13-1960   DATE OF PROCEDURE:  08/07/2004  DATE OF DISCHARGE:  08/07/2004                                 OPERATIVE REPORT   PROCEDURE:  1.  Thrombectomy and revision right upper arm AV graft.  2.  Intraoperative shuntogram.   PREOPERATIVE DIAGNOSIS:  Thrombosed arteriovenous graft.   POSTOPERATIVE DIAGNOSIS:  Thrombosed arteriovenous graft.   ANESTHESIA:  Local with IV sedation.   ASSISTANT:  Nurse.   SURGEON:  Jessy Oto. Oneida Alar, M.D.   OPERATIVE FINDINGS:  1.  Thrombosed AV graft with venous and hyperplasia narrowing.  2.  Revision was 6 mm interposition graft.   DESCRIPTION OF PROCEDURE:  After obtaining informed consent, the patient was  taken to the operating room.  The patient was placed in the supine position  on the operating table.  After adequate sedation, the patient's entire right  upper extremity was prepped and draped in the usual sterile fashion.  Local  anesthesia was infiltrated at the preexisting longitudinal scar up near the  axilla.  Incision was carried down through subcutaneous tissues down to  level of the graft.  Graft was dissected free circumferentially.  The venous  anastomosis was dissected free and a portion of the more proximal vein also  dissected free.  The venous anastomosis and portion of the vein was quite  thickened and white in appearance.  Next the patient was given 5000 units of  intravenous heparin.  A transverse graftotomy was made just above the level  of venous anastomosis.  A #4 Fogarty catheter was then passed through the  venous anastomosis.  This was so tight that the Fogarty would not pass  easily. Therefore the venous anastomosis was taken down.  The vein was  clamped proximally. The thickened  portion of the vein was debrided back to  more healthy appearing vein. Next the proximal portion of the graft was  thrombectomized with a #4 Fogarty catheter until there was good arterial  inflow.  A 6 mm interposition PTFE graft was then brought to the operative  field, beveled on one side and sewn end-to-end to the axillary vein using  running 6-0 Prolene suture.  Just prior completion of the anastomosis, this  was pulled back and back bled and thoroughly flushed.  This was then  reclamped and the graft cut to length.  The graft was then sewn end-to-end  through the old PTFE graft using running 6-0 Prolene suture.  Prior to  completion, the anastomosis was pulled back and back bled, and thoroughly  flushed.  Clamps were released.  There was a palpable pulse in the graft  medially.  Next an intraoperative shuntogram was obtained of the arterial  limb of graft.  A stoma was placed and a 21 gauge Jelco needle within the  graft and with outflow occlusion.  Contrast arteriogram was  obtained which  shows the arterial anastomosis was widely patent with no filling defects.  The butterfly needle was then removed and the hole repaired with a single  Prolene stitch.  Hemostasis was obtained.  Next, the subcutaneous tissues were approximated using running 3-0 Vicryl  suture.  Skin was then closed with 4-0 Vicryl subcuticular stitch.  The  patient tolerated procedure well. There were no complications.  Instrument,  sponge and needle counts were correct at the end of the case. The patient  was taken to recovery in stable condition.       CEF/MEDQ  D:  08/07/2004  T:  08/07/2004  Job:  [id number]

## 2010-06-04 NOTE — Discharge Summary (Signed)
[place]  Patient:    [name], [name]                             MRN: [id number] Adm. Date:  [id number] Disc. Date: 08/19/99 Attending:  Alex Gardener Dictator:   Foye Clock, P.A. CC:         [place]   Discharge Summary  ADMISSION DIAGNOSES: 1. Altered mental status. 2. End-stage renal disease on chronic hemodialysis. 3. Urinary tract infection. 4. Coronary artery disease. 5. 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  DISCHARGE DIAGNOSES: 1. Altered mental status, multifactorial, with dementia, old cerebrovascular    accident, and urinary tract infection etiology. 2. End-stage renal disease on chronic hemodialysis. 3. Hypertension. 4. Urinary tract infection with pyuria.  BRIEF HISTORY AND REASON FOR THE ADMISSION:  Ms. [name] is a 50 year old Caucasian female with end-stage renal disease on chronic hemodialysis at the [facility], who was in her usual state of health until two days prior to admission when her husband noted her being incontinent of loose stool and she also appeared to be confused.  She saw Windy Kalata, M.D., with her husband at dialysis on August 16, 1999.  She was set up for an MRI for evaluation ______ okay.  However, her husband stated that the patient complained of some abdominal pain, but no other complaints and appeared to be more confused and thus brought her to the ER.  Thus, she was admitted for further evaluation after a urinalysis showing an elevated white count with the possible etiology of sepsis causing her confusion versus meningitis versus medications versus urosepsis.  Thus, she was admitted with blood cultures, urine culture, and empiric antibiotics started with follow-up of CT scan of the head.  Actually the CT scan of the head showed no acute findings, but abnormality of the odontoid process.  Repeat  films with attention to the odontoid were ordered when she was admitted.  COURSE IN THE HOSPITAL:  The patients urine culture initially came back with no growth, but then a report came back showing Enterococcus species.  A full report was not available at time of discharge.  She was discharged home on Cipro.  She was to have further follow-up as an outpatient.  This final report is not available again at the time of discharge.  Blood cultures obtained showed no growth.  Her white count on her CBC was 10.3 on admission and stable throughout the hospital stay.  The hemoglobin was stable also.  The potassium was 5.2 on admission.  Her BUN was 32 and the creatinine was 3.3 and stable for her.  Her mental status improved somewhat with antibiotics therapy.  She had no complaints.  The abdominal pain resolved.  She continued to be somewhat disoriented to place and time, which she has been at baseline.  She said that she was feeling better and was able to eat solid foods.  The CT scan again of the head had shown no fractures, no acute stroke, no hemorrhage, an old right frontal infarct, and right-sided basal ganglion and lacunar infarcts.  The patient was unable to hold still for significant exam of the odontoid process. A lateral film noted lucencies of the mid portion of the odontoid.  The patient was back to normal baseline mental status and she was deemed ready for discharge home on  August 19, 1999, for follow-up urine cultures off antibiotics as an outpatient.  Her husband agreed to take the patient home and said that she appeared to be somewhat more stable.  She was discharged home on August 19, 1999.  The altered mental status was felt to be a combination of her dementia with urinary tract infection playing some process and also significant small vessel disease possibly worsening over the past several months.  DISCHARGE MEDICATIONS: 1. Cipro 500 mg q.d. x 10 days. 2. Digoxin 0.125 mg  after dialysis on dialysis days only. 3. Lopressor 25 mg b.i.d., except on dialysis days. 4. Zoloft 50 mg q.d. 5. Insulin 70/30 8 units in the evening and 20 units in the morning. 6. Calcium carbonate 500 mg t.i.d. with meals. 7. Nephro-Vite one q.d. 8. Baby aspirin once a day.  FOLLOW-UP:  Follow up at the [facility] on a Monday, Wednesday, and Friday schedule.  Of note, serum glucoses were stable during the hospital admission at 113.  Her dry weight at the time of discharge was 53.0 kg.  She was tolerating hemodialysis in the hospital without significant problems. DD:  10/21/99 TD:  10/22/99 Job: [id number]

## 2010-06-04 NOTE — H&P (Signed)
[place]  Patient:    [name] Visit Number: [id number] MRN: [id number]          Service Type: MED Location: [place] 01 Attending Physician:  Sol Blazing Dictated by:   Roney Jaffe, M.D. Admit Date:  01/05/2001                           History and Physical  REASON FOR ADMISSION:  Abdominal pain.  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  She presents with 24 hours of increasing and progressive epigastric pain, nausea and vomiting.  She denies fevers, chills, shortness of breath or chest pain.  She has no history of gallstones or pancreatitis or peptic ulcer disease and does not use tobacco, alcohol, illicit drugs or over-the-counter NSAIDs.  Epigastric pain has been increasing and does not radiate to the back or the groin or the right upper quadrant.  PAST MEDICAL HISTORY: 1. ESRD, on hemodialysis Tuesday/Thursday/Saturday.  She had a failed renal    transplant in 1994 and she failed peritoneal dialysis in 2001 due to    recurrent peritonitis.  Dry weight is 67 kg at [facility]. 2. Hypertension with a history of malignant hypertension in the past. 3. Anemia. 4. Status post parathyroidectomy.  ALLERGIES:  PENICILLIN and CODEINE which cause hives.  MEDICATIONS:  A vitamin and a "blood pressure pill."  SOCIAL HISTORY:  ______ .  REVIEW OF SYSTEMS:  GENERAL:  Denies fever, chills, weight loss, night sweats. ENT:  Denies hearing loss, sore throat, difficulty swallowing, visual changes. 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  RESPIRATORY:  Denies productive cough, hemoptysis, history of pneumonia.  CARDIAC:  Denies history of coronary artery disease, angina, MI, arrhythmia in the past.  No  recent chest pain, orthopnea or PND.  GI:  As above.  GU:  Makes small amounts of urine without change in voiding habits or dysuria.  MUSCULOSKELETAL:  Denies acute or chronic joint complaints.  NEUROLOGIC:  No history of stroke, TIA or seizure.  History of malignant hypertension with hypertensive encephalopathy as above.  PHYSICAL EXAMINATION:  VITAL SIGNS:  Temperature 97.6, blood pressure 190/120, heart rate of 100, respirations 20.  GENERAL:  Patient is a thin, well-developed, tall black female obviously in pain, vomiting intermittently but not severely toxic.  SKIN:  Without rash.  HEENT:  PERRLA.  EOMI.  Throat is clear, moist.  NECK:  Supple without JVD.  CHEST:  Clear throughout.  CARDIAC:  Regular rate and rhythm without murmur, rub or gallop.  ABDOMEN:  Abdomen reveals localizing tenderness to the epigastric region and less so mildly in the right and left upper quadrants.  She does have good bowel sounds/active bowel sounds throughout and no rebound tenderness.  There is voluntary guarding.  There is no mass.  There is no right lower quadrant tenderness.  RECTAL:  Deferred.  EXTREMITIES:  There is a left upper arm AV fistula with a good thrill and bruit.  There is left upper chest tunnelled dialysis catheter with a clean exit site.  NEUROLOGIC:  Alert and oriented x 3.  Grossly nonfocal exam.  LABORATORY DATA:  Sodium 135, potassium 3.7, BUN 41, creatinine 10.8.  White blood count 14, hematocrit 45%, platelets 279,000.  Albumin  4.6.  Liver enzymes normal.  Bilirubin 0.6.  Amylase 218, lipase 133, both elevated about two and a half times upper limit of normal.  Abdominal series:  No free air or obstruction.  Possible mild ileus.  Chest x-ray, one view:  Negative.  IMPRESSION: 1. Acute epigastric pain with refractory nausea and vomiting.  Considerations    include pancreatitis, biliary colic and/or cholecystitis.  Mild elevation    of pancreatic enzymes may  be due to renal failure in and of itself. 2. End-stage renal disease.  Due for dialysis tomorrow.  She has recently    resumed use of the left upper arm fistula this week after an infiltration    about a month ago requiring use of a catheter. 3. Hypertension, uncontrolled currently. 4. Anemia due to chronic renal failure. 5. History of parathyroidectomy. 6. Penicillin allergy.  PLAN: 1. Admit to step-down unit as she needs IV antihypertensives. 2. N.p.o. 3. CT of abdomen tonight to look at pancreas. 4. Ultrasound of the right upper quadrant in the morning to look at the    biliary tree and gallbladder. 5. Antiemetics and pain medication.  No need for antibiotics at this point. 6. Hemodialysis tomorrow. Dictated by:   Roney Jaffe, M.D. Attending Physician:  Sol Blazing DD:  01/06/01 TD:  01/07/01 Job: [id number]

## 2010-06-04 NOTE — Op Note (Signed)
   NAME:  [name], [name]                              ACCOUNT NO.:  [id number]   MEDICAL RECORD NO.:  [id number]                   PATIENT TYPE:  OIB   LOCATION:  2852                                 FACILITY:  [facility]   PHYSICIAN:  Judeth Cornfield. Scot Dock, M.D.        DATE OF BIRTH:  [birth year]   DATE OF PROCEDURE:  08/16/2002  DATE OF DISCHARGE:  08/16/2002                                 OPERATIVE REPORT   PREOPERATIVE DIAGNOSIS:  Chronic renal failure.   POSTOPERATIVE DIAGNOSIS:  Chronic renal failure.   PROCEDURE:  Placement of new left upper arm arteriovenous graft.   SURGEON:  Judeth Cornfield. Scot Dock, M.D.   ASSISTANTRuthann Cancer, R.N.F.A.   ANESTHESIA:  Local with sedation.   TECHNIQUE:  The patient was taken to the operating room, sedated by  anesthesia.  The left upper extremity was prepped and draped in the usual  sterile fashion.  After the skin was anesthetized with 1% lidocaine, the  longitudinal incision was made just above the antecubital level, where the  brachial artery was dissected free.  It was somewhat small but felt to have  an adequate pulse.  A separate longitudinal incision was made beneath the  axilla.  The largest of the two brachial veins was selected.  A 4-7 mm graft  was then tunneled between the two incisions.  The graft was then brought  through the tunnel and the patient was heparinized.  The brachial artery was  clamped proximally and distally and a longitudinal arteriotomy was made.  A  short segment of the 4 mm end of the graft was excised, the graft slightly  spatulated and sewn end-to-side to the brachial artery using continuous 6-0  Prolene suture.  The graft was then pulled to the appropriate length for  anastomosis to the high brachial vein.  The vein was ligated distally and  spatulated proximally.  The graft was cut to the appropriate length,  spatulated, and sewn end-to-side to the vein using continuous 6-0 Prolene  suture.  At the completion  there was a good thrill in the graft.  There was  an ulnar signal with the Doppler.  Hemostasis was obtained in the wounds.  The wounds were closed with a deep layer of 3-0 Vicryl, the skin closed with  4-0 Vicryl.  A sterile dressing was applied.  The patient tolerated the  procedure well and was transferred to the recovery room in satisfactory  condition.  All needle and sponge counts were correct.                                               Judeth Cornfield. Scot Dock, M.D.    CSD/MEDQ  D:  08/16/2002  T:  08/16/2002  Job:  [id number]

## 2010-06-04 NOTE — H&P (Signed)
NAME:  [name], [name]                              ACCOUNT NO.:  [id number]   MEDICAL RECORD NO.:  [id number]                   PATIENT TYPE:  INP   LOCATION:  5524                                 FACILITY:  [facility]   PHYSICIAN:  Windy Kalata, M.D.          DATE OF BIRTH:  [birth year]   DATE OF ADMISSION:  12/11/2002  DATE OF DISCHARGE:                                HISTORY & PHYSICAL   CHIEF COMPLAINT:  Abdominal pain.   HISTORY OF PRESENT ILLNESS:  This 50 year old African-American female with  end-stage renal disease, recently discharged on December 02, 2002, for  pancreatitis, now presents to the [facility] ER with these same symptoms.  Her nausea, vomiting, and diarrhea began this morning at 5 o'clock, and she  took p.o. Phenergan but threw that up.  She describes crampy epigastric pain  and emesis that is not bloody and not bilious.  She also denies melena or  hematochezia.  She does not have any abdominal pain after eating, and her  last meal was last evening.  Her last hemodialysis was yesterday.   REVIEW OF SYSTEMS:  Negative for fever, cough, chest pain, increased  swelling or headache. She does state she has some chills.   PAST MEDICAL HISTORY:  1. End-stage renal disease on hemodialysis at [facility] on Tuesday,     Thursday, and Saturday.  2. Hypertension.  3. History of pancreatitis in 2002 and November 28, 2002.  4. Hyperlipidemia.   MEDICATIONS:  1. Lisinopril 5 mg daily.  2. Lipitor 10 mg q.h.s.  3. Os-Cal 500 mg t.i.d. with meals.  4. Nephro-Vite daily.  5. Protonix 40 mg daily.  6. Phenergan 25 mg q.4h. p.r.n. nausea.   ALLERGIES:  PENICILLIN and CODEINE.  Both cause hives.   FAMILY HISTORY:  An aunt with diabetes.  No family history of kidney  disease.   SOCIAL HISTORY:  The patient lives with her mom and dad, has one child, and  denies alcohol or tobacco use.  She is currently on disability.   PHYSICAL EXAMINATION:  VITAL SIGNS:   Temperature 98.0, pulse 83,  respirations 16, blood pressure 151/100.  Oxygen saturation 98% on room air.  GENERAL:  African-American female in pain but in no acute distress.  She is  alert and oriented x 3.  HEENT:  Pupils equal, round, and reactive to light.  Extraocular movements  intact.  Sclerae nonicteric.  Dry mucous membranes, oropharynx clear.  NECK:  Supple.  No lymphadenopathy.  CARDIOVASCULAR:  Regular rate and rhythm without murmurs, rubs, or gallops.  LUNGS:  Clear to auscultation bilaterally.  ABDOMEN:  Soft, tender to palpation diffusely but worse in the epigastric  area.  No rebound, no Murphy sign.  EXTREMITIES:  Trace lower extremity edema bilaterally, 2+ pedal pulses  bilaterally, 5/5 strength bilaterally.  NEUROLOGIC:  Cranial nerves II-XII grossly intact.   LABORATORY DATA:  White blood  count 14.2, hemoglobin 14.6, hematocrit 43.1,  platelets 296.  Sodium 139, potassium 3.0, chloride 98, bicarb 30, BUN 21,  creatinine 10.0, glucose 93.  Total bilirubin 0.7, total protein 8.3,  alkaline phosphatase 72, albumin 3.8, AST 20, ALT 8, calcium 8.9.  Amylase  153, lipase 62.   Chest x-ray negative.   Acute abdominal series shows nonspecific bowel gas pattern but no air/fluid  levels.   Abdominal ultrasound shows no acute process and atrophic kidneys, bilateral  renal cysts.   ASSESSMENT/PLAN:  A 50 year old female with end-stage renal disease and  recurrent pancreatitis.   1. Acute  pancreatitis.  Etiology unknown.  An ultrasound was negative for     cholelithiasis or pseudocyst.  May be idiopathic in origin but would     repeat triglycerides.  Also, the patient has no history of alcohol use.     Will continue n.p.o. except sips and Phenergan for nausea.  2. End-stage renal disease.  The patient failed transplant in the past and     now is on hemodialysis Tuesday, Thursday, and Saturday.  Due to the     holiday schedule, will hemodialyse on Friday here in the  hospital and     then resume her previous schedule.  3. Hypokalemia secondary to nausea, vomiting, and diarrhea.  Will replete     and recheck level in the morning.  4. Hypertension.  Elevated blood pressure most likely secondary to pain.     Continue on home dose of lisinopril and monitor.  5. Hyperlipidemia.  Hold Lipitor during acute illness and will check     triglycerides secondary to #1.      Sallyanne Havers, M.D.                       Windy Kalata, M.D.    AS/MEDQ  D:  12/11/2002  T:  12/11/2002  Job:  [id number]   cc:   [facility]

## 2010-06-04 NOTE — Op Note (Signed)
NAME:  [name], [name]                              ACCOUNT NO.:  [id number]   MEDICAL RECORD NO.:  [id number]                   PATIENT TYPE:  INP   LOCATION:  6704                                 FACILITY:  [place]   PHYSICIAN:  Delfin Edis, M.D. [facility]               DATE OF BIRTH:  [birth year]   DATE OF PROCEDURE:  07/20/2002  DATE OF DISCHARGE:                                 OPERATIVE REPORT   NAME OF PROCEDURE:  Colonoscopy.   INDICATIONS:  This 50 year old African-American female with chronic renal  failure presented with diarrhea, abdominal pain, and evidence of edema of  the terminal ileum, suggestive of enteritis or inflammatory process in the  right lower quadrant.  General findings were found on CT scan six months  ago.  Two cultures are pending.  She is undergoing colonoscopy to further  assess her inflammatory process to rule out inflammatory bowel disease.   ENDOSCOPE:  Single chamber video endoscope.   SEDATION:  1. Versed 10 mg IV.  2. Fentanyl 100 mcg IV.   FINDINGS:  The single chamber video endoscope was passed under direct vision  in the rectum to the sigmoid colon.  The patient was monitored by pulse  oximetry, and her oxygen saturations were normal.  The prep was only  marginal.  Anal canal and rectal ampulla were normal with some nonspecific  erythema in the rectal ampulla consistent with prep-related operations from  an enema.  There were scattered diverticula throughout the sigmoid colon and  descending colon.  The splenic flexure, transverse colon, and hepatic  flexure mucosa were unremarkable.  The colonoscope passed easily through the  right colon to the cecum.  The cecal pouch with a deep appendiceal opening  which otherwise appeared normal.  Ileocecal valve itself appeared normal.  We were able to get to the orifice of the ileocecal valve, but could not  pass the endoscope through the ileocecal valve itself; but the valve itself  appeared normal.  There  were no inflammatory changes or deformity noted.  Biopsies were taken randomly from the cecum and as well as from the right  colon.  The colonoscope was then retracted, the colon decompressed.  The  patient tolerated the procedure well.   IMPRESSION:  1. Mild diverticulosis of the left and transverse colon.  2. Normal-appearing ileocecal valve, failure to visualize terminal ileum,     status post random biopsies of the cecum and right colon.   PLAN:  1. Await results of the biopsies.  2. Since there is no evidence of Crohn's disease in the ileocecal valve, we     will obtain a small-bowel follow-     through to visualize the terminal ileum.  In the meantime, we will await     results of the stool cultures.  3. Since the blood cultures showed micrococcal species, this may not be  likely a GI pathogen.  We will await results of the special stool     cultures for Yersinia and consider putting the patient empirically on     Cipro.                                               Delfin Edis, M.D. [facility]    DB/MEDQ  D:  07/20/2002  T:  07/21/2002  Job:  [id number]

## 2010-06-04 NOTE — Discharge Summary (Signed)
NAMETRINAE, ALVARES NO.:  [id number]   MEDICAL RECORD NO.:  [id number]          PATIENT TYPE:  INP   LOCATION:  6705                         FACILITY:  [facility]   PHYSICIAN:  Windy Kalata, M.D.DATE OF BIRTH:  [birth year]   DATE OF ADMISSION:  01/04/2007  DATE OF DISCHARGE:  01/08/2007                               DISCHARGE SUMMARY   DISCHARGE DIAGNOSES:  1. Pancreatitis.  2. Anemia.  3. Hyperlipidemia.  4. Secondary hyperparathyroidism.   PROCEDURE:  Hemodialysis.   HISTORY OF PRESENT ILLNESS:  [name] is a 50 year old black female with  a history of idiopathic pancreatitis who complains of intermittent  nausea, vomiting, and diarrhea over the past 2 weeks.  She was in her  usual state of health, but today after dialysis developed severe  abdominal pain and vomiting on multiple occasions, along with loose  watery stools.  This is very similar to a similar presentation with  pancreatitis, with the last one being a little over a year ago.  She did  have a CMET, amylase, and lipase done as an outpatient on December 19, 2006, all of which were normal.  Labs drawn in the emergency room showed  a white count of 11,500, with 82% neutrophils, 13% lymphocytes, 4%  monocytes, hemoglobin 13.4, platelets 244,000.  Sodium 139, potassium  3.6, chlorides 98, CO2 26, glucose 108, BUN 12, creatinine 6.7, albumin  3.8.  Liver function tests within normal limits.  Amylase 155, lipase  93.  Acute abdominal series showed small bowel dilatation, with air-  fluid levels, likely early or partial small bowel obstruction.   HOSPITAL COURSE:  1. Pancreatitis.  The patient was admitted to the medical renal unit      for pain control and further treatment.  She was made n.p.o.,      except ice chips.  Repeat amylase and lipase on January 05, 2007      showed a decrease in amylase to 135 and significant decrease in      lipase to 39.  The patient continued to improve gradually.   Her      diet was slowly reintroduced, and at the time of discharge she was      tolerating solids well, and lipase continued to be within normal      limits.  She was discharged home January 08, 2007 to continue on      her usual Pancrease.  White count, which was slightly elevated on      admission, was 5.1 the date of discharge.  2. Anemia.  Hemoglobin decreased some during hospitalization.  This      may have been due to old volume status on admission from nausea and      vomiting.  Hemoglobin was 10.8 at discharge.  Her Epogen dose at      the time of discharge was increased to 3000.  3. Hyperlipidemia.  The patient continued on Zetia during this      hospitalization.  4. Secondary hyperparathyroidism.  The patient was on vitamin D on  admission.  She will continue on the same dose of Hectorol 5.5 mcg      IV every dialysis.  Her calcium at the time of discharge was 8.7,      and phosphorus was 5.3.  These will be monitored per protocol at      her dialysis center.   CONDITION AT DISCHARGE:  Improved.   DISCHARGE DISPOSITION:  The patient is discharged to home.   DISCHARGE MEDICATIONS:  1. PhosLo 1 with meals 3 times a day.  2. Nephro-Vite 1 daily.  3. Zetia 10 mg per day.  4. Pancrease 2 with meals 3 times a day.  5. Xanax 0.25 b.i.d. p.r.n.   DISCHARGE DIET:  She is to be on a renal diet and avoid fatty foods.  Limit fluids to 1200 mL per day.   ACTIVITY LEVEL:  As tolerated.   DIALYSIS INSTRUCTIONS:  She is to return to her scheduled dialysis  appointment.   Thirty-five minutes were spent completing the patient's discharge  paperwork, communicating discharge information to her dialysis center,  and dictating discharge summary.      Alric Seton, P.A.    ______________________________  Windy Kalata, M.D.    MB/MEDQ  D:  01/29/2007  T:  01/30/2007  Job:  [id number]   cc:   [place]

## 2010-06-04 NOTE — Op Note (Signed)
NAMESHIWANI, MAURIN NO.:  [id number]   MEDICAL RECORD NO.:  [id number]          PATIENT TYPE:  INP   LOCATION:  5501                         FACILITY:  [facility]   PHYSICIAN:  Rosetta Posner, M.D.    DATE OF BIRTH:  [birth year]   DATE OF PROCEDURE:  11/13/2005  DATE OF DISCHARGE:  11/13/2005                                 OPERATIVE REPORT   PREOPERATIVE DIAGNOSIS:  End-stage renal disease with occluded right upper  arm AV Gore-Tex graft.   POSTOPERATIVE DIAGNOSIS:  End-stage renal disease with occluded right upper  arm AV Gore-Tex graft.   PROCEDURE:  Thrombectomy and interposition graft revision of right upper arm  AV Gore-Tex graft.   SURGEON:  Rosetta Posner, M.D.   ASSISTANT:  Nurse.   ANESTHESIA:  MAC.   COMPLICATIONS:  None.   DISPOSITION:  To recovery room stable.   DESCRIPTION OF PROCEDURE:  The patient was taken to the operating position  where the area of the right arm and right axilla were prepped and draped in  the usual sterile fashion.  Using local anesthesia, incision was made over  the axillary incision and carried down to isolate the graft to vein  anastomosis.  This was an end-to-end anastomosis.  The graft was opened near  the venous anastomosis and there was a slight stenosis at the venous  anastomosis.  The graft itself was thrombectomized, the arterialized plug  was removed and an excellent inflow was encountered.  The graft was flushed  with heparinized saline and reoccluded.  The vein was exposed further  proximally in the axilla and was occluded with a vascular clamp.  The old  venous anastomosis was excised and the vein was spatulated.  The new  interposition jump graft of 7 mm Gore-Tex brought into the field was  spatulated and sewn end to end to the vein with a running 6-0 Prolene  suture.  The vascular clamp was removed from the vein and this was flushed  with heparinized and reoccluded.  Next, the graft was cut to the  appropriate  length and was sewn end-to-end to the old graft with a running 6-0 Prolene  suture.  Clamps removed and excellent thrill was noted.  Wounds irrigated  with saline.  Hemostasis with cautery.  Wounds closed with 3-0 Vicryl in the  subcutaneous and subcuticular tissue and benzoin and Steri-Strips were  applied.      Rosetta Posner, M.D.  Electronically Signed     TFE/MEDQ  D:  11/13/2005  T:  11/14/2005  Job:  [id number]

## 2010-06-04 NOTE — H&P (Signed)
NAME:  [name], [name]                              ACCOUNT NO.:  [id number]   MEDICAL RECORD NO.:  [id number]                   PATIENT TYPE:  INP   LOCATION:  5506                                 FACILITY:  [facility]   PHYSICIAN:  Tamika J. Lavonia Drafts, M.D.                DATE OF BIRTH:  09-13-1960   DATE OF ADMISSION:  04/27/2002  DATE OF DISCHARGE:  05/05/2002                                HISTORY & PHYSICAL   CHIEF COMPLAINT:  Fever.   HISTORY OF PRESENT ILLNESS:  The patient is a 50 year old African-American  female well known to the renal service. Past medical history is significant  for end stage renal disease on chronic hemodialysis every Tuesday, Thursday  and Saturday at [facility]. The patient presents with 2-  3 days of fever to 103 and chills. Blood cultures obtained at her  hemodialysis center the day prior to admission grew gram positive cocci. She  received Vancomycin yesterday, however, she continues to have fevers not  relieved with Tylenol. Our other concern is that her AV fistula in the right  arm is clotted and have been using a PermCath in the right IJ for access.   REVIEW OF SYSTEMS:  Positive nonproductive cough, positive pleuritic chest  pain. No shortness of breath. Positive nausea. No vomiting, diarrhea or  rash. Positive generalized myalgias.   PAST MEDICAL HISTORY:  1. 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 History of kidney transplant in 1993 status post     nephrectomy secondary to failure in 1997.  2. History of hypertension.  3. Anemia.  4. Secondary hypoparathyroidism status post parathyroidectomy.   MEDICATIONS:  Epogen, Calcijex.   ALLERGIES:  CODEINE, PENICILLIN.   SOCIAL HISTORY:  She is divorced and lives with her mother and father. She  has one daughter who lives in [place]. She is presently unemployed. No  tobacco, alcohol or drug use.   FAMILY HISTORY:  Parents are alive and healthy.  Does report a history of  diabetes mellitus in an aunt. No family history of renal disease.   PHYSICAL EXAMINATION:  VITAL SIGNS: Temperature 102.4, blood pressure  135/80, heart rate 105, respiratory rate 20. O2 sat 98% on room air.  GENERAL: Pleasant, well developed, well nourished African-American female in  no acute distress.  HEENT: Normocephalic, atraumatic. Extraocular muscles intact. Pupils are  equal, round, and reactive to light and accommodation. Oropharynx clear.  NECK: Supple without lymphadenopathy. Positive PermCath in the right IJ.  LUNGS: Clear to auscultation bilaterally. Non labored and good air movement.  CARDIAC: Regular rate and rhythm.  No murmur appreciated.  ABDOMEN: Soft, nontender, nondistended. Positive bowel sounds. Multiple well  healed scars on abdomen.  EXTREMITIES: No lower extremity edema. DP pulses 2+ bilaterally.  SKIN: Warm and dry without rash.  NEURO: Nonfocal.   LABORATORY DATA:  Sodium  131, potassium 3.3, chloride 94, bicarb 25, BUN 23,  creatinine 8.8, glucose 101. WBC count 10.5. Hemoglobin and hematocrit 10.9  and 32.4. Platelets 301,000.   ASSESSMENT/PLAN:  1. Sepsis secondary to gram positive cocci. The patient continues to have     fever despite Vancomycin therapy. Suspect possible PermCath as source of     infection. Will have radiology discontinue the catheter. Will check blood     cultures times two. Add Tobramycin. She is presently hemodynamically     stable and we will continue to monitor closely.  2. End stage renal disease on hemodialysis every Tuesday, Thursday and     Saturday. She will need access following PermCath removal. She is     scheduled for an AV graft on April 30, 2002.  3. Anemia. Continue Epogen every hemodialysis. Check CBC.  4. History of hypertension. Blood pressure stable at present. Will follow.  5. History of secondary hyperparathyroidism, status post parathyroidectomy.     Continue checks with  hemodialysis.                                               Tamika J. Lavonia Drafts, M.D.    Lorette Ang  D:  05/05/2002  T:  05/06/2002  Job:  [id number]   cc:   Jeneen Rinks L. Deterding, M.D.  [place]  [place] [address]  Fax: [phone]

## 2010-06-04 NOTE — Discharge Summary (Signed)
[name], [name] NO.:  [id number]   MEDICAL RECORD NO.:  [id number]          PATIENT TYPE:  INP   LOCATION:  5501                         FACILITY:  [facility]   PHYSICIAN:  Windy Kalata, M.D.DATE OF BIRTH:  12/23/1960   DATE OF ADMISSION:  11/10/2005  DATE OF DISCHARGE:  11/13/2005                               DISCHARGE SUMMARY   ADMITTING DIAGNOSES:  1. Abdominal pain.  2. End-stage renal disease on chronic hemodialysis.  3. Hyperlipidemia.  4. Iron-deficiency anemia secondary to chronic disease.  5. Secondary hyperparathyroidism.   DISCHARGE DIAGNOSES:  1. Pancreatitis resolved.  2. Status post thrombectomy and revision of right upper arm graft.  3. End-stage renal disease on chronic hemodialysis.  4. Anemia of chronic disease.  5. Secondary hyperparathyroidism.  6. Hyperlipidemia.   BRIEF HISTORY:  This 50 year old black female with end-stage renal  disease secondary to hypertension on chronic hemodialysis every Tuesday,  Thursday, Saturday at [facility] is being admitted  with acute onset severe abdominal pain and diarrhea which woke her up in  the middle of the night.  She has a history of chronic pancreatitis.  She is experiencing nausea but no vomiting.  She denies fever, chills,  shortness of breath, chest pain.   LABS:  On admission, white count 9800, hemoglobin 14.9, hematocrit 44.3,  platelets 254,000.  Sodium 139, potassium 3.8, chloride 96, CO2 32,  glucose 96, BUN 19, creatinine 8.3, calcium 9.6, amylase 146, lipase 37,  phosphorus 6.2.   HOSPITAL COURSE:  The patient was admitted, placed on GI rest.  Within  24 hours, her diet was advanced to clear liquids and she was resuming  her oral medications.  Amylase peaked at 207 and lipase remained within  normal limits.  She improved quickly.  Over the course of this  hospitalization, her diet was advanced to solid foods which she  tolerated well.  The last repeated  amylase was 107, lipase 29.   Unfortunately, her right upper arm graft clotted while in the hospital.  She underwent thrombectomy and revision by Dr. Donnetta Hutching November 13, 2005.  She tolerated the procedure well.  She was discharged home  postoperatively in stable condition.   DISCHARGE MEDICATIONS:  1. Pancreas two with meals.  2. Multivitamin one daily.  3. PhosLo 667 mg one with meals.  4. Zetia 10 mg daily.  5. EPO 400 units IV each dialysis.  6. Hectorol 2.5 mcg IV each dialysis.      Nonah Mattes, P.A.    ______________________________  Windy Kalata, M.D.    RRK/MEDQ  D:  01/01/2006  T:  01/02/2006  Job:  [id number]

## 2010-06-04 NOTE — Consult Note (Signed)
[place]  Patient:    [name], [name]                             MRN: [id number] Proc. Date: 08/19/99 Adm. Date:  [id number] Attending:  Franchot Erichsen CC:         Judeth Cornfield. Scot Dock, M.D.                          Consultation Report  REASON FOR CONSULTATION:  Need for hemodialysis access catheter.  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  She had previously been admitted with Enterobacter and peritonitis, which was treated with antibiotics.  She was discharged on July 7.  She returns now with increasing abdominal pain, abdominal cramping, diarrhea, and vomiting.  She also reports chills on the morning of admission.  She had just completed a 14-day course of Cipro and also Tressie Ellis was being given into her intraperitoneal fluid for 14 days.  PAST MEDICAL HISTORY:  Significant for in addition to her end-stage renal disease, she is status post a cadaveric renal transplant, which failed in 1994.  She has a history of malignant hypertension.  She has a history of chronic hypokalemia.  She has a history of secondary hyperparathyroidism.  REVIEW OF SYSTEMS:  Currently she has some mild abdominal pain.  She has no chest pain or shortness of breath.  PHYSICAL EXAMINATION:  VITAL SIGNS:  Her current temperature is 101.8, blood pressure 116/70, heart rate is 108.  LUNGS:  Clear bilaterally to auscultation.  ABDOMEN:  Mildly tender.  She had her peritoneal dialysis catheter removed in the operating room today.  IMPRESSION:  Peritonitis secondary to infected peritoneal dialysis catheter, which has now been removed.  I agree that it would be best to wait several days before placing an Ash catheter, and we will plan on placing it Monday, assuming her temperature resolves and she shows no  evidence of continued peritonitis.  I discussed the procedure and potential complications in detail with the patient. DD:  08/19/99 TD:  08/20/99 Job: [id number]

## 2010-06-04 NOTE — Discharge Summary (Signed)
[name], [name] NO.:  [id number]   MEDICAL RECORD NO.:  [id number]          PATIENT TYPE:  OIB   LOCATION:  NA                           FACILITY:  [facility]   PHYSICIAN:  Louis Meckel, M.D.DATE OF BIRTH:  04-15-1960   DATE OF ADMISSION:  DATE OF DISCHARGE:  08/06/2004                                 DISCHARGE SUMMARY   DISCHARGE DIAGNOSES:  1.  Idiopathic pancreatitis, recurrent.  2.  End-stage renal disease secondary to hypertension and      glomerulonephritis.  3.  Anemia.  4.  Secondary hyperparathyroidism.  5.  Hypertension.   OPERATION/PROCEDURE:  None.   CONSULTATIONS:  [facility].   HISTORY OF PRESENT ILLNESS:  This is a 50 year old African American female  with a history of extensive for end-stage renal disease ([place], Tuesday, Thursday and Saturday) secondary to  glomerulonephritis and hypertension.  The patient has  history of multiple  admissions for abdomen issues.  The patient has been peritoneal dialysis in  the past. She has had a failed kidney transplant and has also had several  episodes of pancreatitis of idiopathic origin.  She was most recently  admitted in December 2005 and June of 2006 with increased amylase and  lipase.  At that time she also appeared to  have a small bowel obstruction  that resolved with conservative measures.  The patient called this evening  with some complaints of abdominal pain, nausea, vomiting, and diarrhea that  began on August 02, 2004.  She was asymptomatic yesterday.  She denies chest  pain, shortness of breath, some feelings of hot and cold.  Her hemodialysis  went well without any episode of low blood pressure.   ADMISSION LABORATORY DATA:  Amylase 177, lipase 72, WBC 13.6, hemoglobin  13.8, hematocrit 40.6, platelet count 318.  Sodium 135, potassium 3.9, BUN  35, creatinine 12.5, alk phos 67, SGOT 26, SGPT 9, total protein 7.9,  albumin 3.9, calcium  10.   ADMISSION X-RAYS:  1.  Two-view abdominal x-ray:  Impression was negative for free air or      obstruction.  2.  One-view chest x-ray:  Impression showed no acute disease.   HOSPITAL COURSE:  1.  Idiopathic pancreatitis.  Amylase and lipase were elevated on admission.      The patient was treated with anti-emetics and pain medications      initially.  Imaging was held off due to large amount of imaging she had      undergone recently.  Clostridium difficile screen was negative.  Her PPI      was continued. The abdominal pain did resolve.  The diet was initially      NPO but progressed slowly and without difficulty.  A GI consultation was      obtained.  Amylase and lipase were initially elevated.  However, on August 05, 2004, amylase had decreased to 100 and lipase 52.  Pancrease therapy      was also initiated.  She is to follow up with Dr.  Henrene Pastor as an      outpatient.  2.  End-stage renal disease.  The patient is a Tuesday, Thursday and      Saturday hemodialysis patient at [facility].  After      hemodialysis on August 05, 2004, her right upper extremity arteriovenous      graft clotted.  She is scheduled for a thrombectomy with AV graft      revision on Saturday, August 07, 2004, at 6:30.  She will return to [facility] Saturday afternoon after her procedure for the      second shift hemodialysis.  No changes in dialysis orders were made.  3.  Anemia. The patient's Aranesp and __________ were continued, remained      stable.  4.  Secondary hyperparathyroidism.  The patient is currently on no binders.      She continued to receive Hectorol on hemodialysis and remained stable.  5.  Hypertension on no antihypertensive medications at this time.  Remains      stable.   DISCHARGE MEDICATIONS:  1.  Protonix 40 mg p.o. daily.  2.  Nephro-Vite one tablet p.o. daily.  3.  Pancrease two capsules t.i.d. with meals.  4.  Hemodialysis  medications.  No changes at this time.   HEMODIALYSIS INSTRUCTIONS:  The patient will return to [facility].  She will attend the second shift hemodialysis Saturday, July  22 due to a scheduled thrombectomy and AV graft revision Saturday morning.  She will then resume her first shift Tuesday, Thursday and Saturday slot at  [facility].   DISCHARGE INSTRUCTIONS:  The patient will be NPO after midnight for her  procedure in the morning.  She will then resume her renal diet.  She will  report to the hospital in the morning at 6:30 a.m.   ACTIVITY:  As tolerated.   FOLLOW UP:  She will follow up with Dr. Henrene Pastor and GI physicians on an  outpatient basis.      Trace   TPS/MEDQ  D:  08/06/2004  T:  08/06/2004  Job:  [id number]   cc:   [facility]

## 2010-06-04 NOTE — Op Note (Signed)
[name], DESFOSSES NO.:  [id number]   MEDICAL RECORD NO.:  [id number]          PATIENT TYPE:  OIB   LOCATION:  2899                         FACILITY:  [facility]   PHYSICIAN:  Dorothea Glassman, M.D.    DATE OF BIRTH:  08/10/1960   DATE OF PROCEDURE:  11/18/2003  DATE OF DISCHARGE:                                 OPERATIVE REPORT   PREOPERATIVE DIAGNOSES:  1.  End-stage renal failure.  2.  Clotted right upper arm arteriovenous graft.   POSTOPERATIVE DIAGNOSES:  1.  End-stage renal failure.  2.  Clotted right upper arm arteriovenous graft.   PROCEDURES:  Thrombectomy and revision of right upper arm arteriovenous  graft.   SURGEON:  Dorothea Glassman, M.D.   ASSISTANT:  Suzzanne Cloud, P.A.   ANESTHETIC:  Local with MAC.   DESCRIPTION OF PROCEDURE:  The patient was brought to the operating room in  stable condition.  Placed in supine position.  The right arm was prepped and  draped in a sterile fashion.  The skin and subcutaneous tissues were  infiltrated with 1% Xylocaine with epinephrine.  A longitudinal skin  incision was made through the scar on the right axilla.  Dissection carried  down to expose an end-to-end anastomosis to the axillary vein.  The vein was  mobilized proximally and controlled with a Gregory clamp.  The venous  anastomosis was taken down.  A short segment of vein was excised where there  was pseudointimal narrowing.  The graft was divided transversely across the  venous limb.  The graft was thrombectomized several times with a 5 Fogarty  catheter.  Excellent inflow obtained.  The graft was filled with  heparin/saline solution and controlled with a fistula clamp.  A new segment  of 6 mm Gore-Tex was anastomosed end to end to the indwelling graft using a  running 6-0 Prolene suture.  This was then anastomosed end to end to the  outflow vein using running 6-0 Prolene suture.  Clamps were then removed.  Excellent flow present.  Adequate hemostasis  obtained.  Sponge and  instrument counts correct.   The subcutaneous tissues were closed with a running 3-0 Vicryl suture.  The  skin was closed with 4-0 Monocryl.  Steri-Strips applied.   The patient tolerated the procedure well.  Transferred to the recovery room  in stable condition.     PGH/MEDQ  D:  11/18/2003  T:  11/18/2003  Job:  [id number]

## 2010-06-04 NOTE — Discharge Summary (Signed)
NAMEALYZON, STAPEL                              ACCOUNT NO.:  [id number]   MEDICAL RECORD NO.:  [id number]                   PATIENT TYPE:  INP   LOCATION:  [id number]                                 FACILITY:  [facility]   PHYSICIAN:  Sallyanne Havers, M.D.                 DATE OF BIRTH:  [birth year]   DATE OF ADMISSION:  12/11/2002  DATE OF DISCHARGE:  12/13/2002                                 DISCHARGE SUMMARY   DISCHARGE DIAGNOSES:  1. Vomiting.  2. Diarrhea.  3. History of pancreatitis.  4. End-stage renal disease.  5. Hyperlipidemia.  6. Hypertension.   DISCHARGE MEDICATIONS:  1. Nephro-Vite 1 tablet daily.  2. Lisinopril 5 mg q.h.s.  3. Phenergan12.5 mg suppositories every 4-6 hours as needed for nausea.  4. Lipitor 10 mg q.h.s.  5. Os-Cal 500 mg t.i.d. with meals.  6. Protonix 40 mg daily.   DISPOSITION AND FOLLOWUP:  The patient discharged to home and will follow up  at the [facility] on Tuesday, 11/30, to resume dialysis on Tuesdays,  Thursdays, and Saturdays.   HOSPITAL COURSE:  This 50 year old African American female with end-stage  renal disease presented to [facility] Emergency Department with onset of  nausea, vomiting, diarrhea earlier that morning.  She had been discharged  for the same symptoms a week prior and was diagnosed with acute pancreatitis  of unknown etiology.  Her lipase on this admission was slightly elevated at  62 and next day lipase was down to 28.  An abdominal ultrasound was  completed which did not show evidence of cholecystitis or pancreatic  pseudocyst.  Also, lipid panel was obtained and triglycerides were 300 and  this was not likely the etiology as well.  The patient may have had  gastroenteritis and she was doing better on day of discharge, tolerating  p.o. intake and having no nausea or vomiting.  She will be given a Phenergan  suppository prescription if this recurs at home and will resume dialysis at  [facility].                                          Sallyanne Havers, M.D.    AS/MEDQ  D:  12/13/2002  T:  12/13/2002  Job:  [id number]   cc:   [facility]

## 2010-06-04 NOTE — Discharge Summary (Signed)
[place]. [facility]  Patient:    [name], [name]                             MRN: [id number] Adm. Date:  [id number] Disc. Date: [id number] Attending:  Allyn Kenner CC:         Maudie Flakes. Hassell Done, M.D.   Discharge Summary  [facility] [phone]  FINAL DIAGNOSES: 1. Secondary hyperparathyroidism. 2. End-stage renal failure. 3. Status hemodialysis. 4. Hypertension.  HISTORY OF PRESENT ILLNESS:  This 50 year old female has had end-stage renal failure for approximately 15 years.  She was originally on hemodialysis, then had a kidney transplant in 1985.  That eventually failed, and she had it removed.  She had a two-year episode of peritoneal dialysis, but the catheter had to be removed approximately one year ago, and she is back on hemodialysis on Tuesdays, Thursdays, and Saturdays through a dialysis catheter in her right subclavian vein.  She developed secondary hyperparathyroidism and responded to medical treatment in the early part of the disease but, for the past year whenever she gets Calcijex or extra calcium, it causes her to have hypercalcemia and, for that reason, she was referred for surgical treatment.  Her parathyroid hormone level most recently was 1917.  PHYSICAL EXAMINATION:  Unremarkable except for Quinton catheter in the right subclavian vein.  Also, there was a well-healed hockey stick incisional scar in the left lower quadrant of the abdomen from the previous kidney transplant, and also a short vertical right paraumbilical incision of previous peritoneal dialysis catheter.  There also were multiple scars in the left arm and left forearm from previous vascular access procedures.  HOSPITAL COURSE:  On the day of admission the patient underwent total parathyroidectomy and autotransplantation of parathyroid tissue to the left forearm.  She tolerated the procedure very well.  Postoperatively she had the expected decrease in serum calcium  down to 7.4 mg percent.  The calcium was increased in her hemodialysis as well as she was given Calcijex 0.3 mcg IV at the time of each hemodialysis as well as Os-Cal 2 with meals.  She was dialyzed on March 26, as well as April 13, 2000.  The patient noticed that her bone pain resolved postoperatively.  By April 12, 2000, she was tolerating a solid diet.  Her voice was normal.  She was having no pain.  She continued to improve, and by April 13, 2000, she was able to be discharged.  Her skin staples were removed and Steri-Strips applied in her neck.  The forearm staples were to be removed as an outpatient.  Her serum calcium was low on the morning of April 13, 2000, but responded to the treatment as mentioned above.  CONDITION ON DISCHARGE:  Satisfactory.  DIET:  Renal diet.  MEDICATIONS: 1. Nephro-Vite 1 daily. 2. Reglan 5 mg with meals and at bedtime. 3. Os-Cal 500 3 between meals and at bedtime, none with meals. 4. Darvocet 1 every six hours as needed for pain. 5. Epogen 2000 units at hemodialysis each treatment. 6. Calcijex 3 mcg IV at hemodialysis. 7. Calcium phosphate at hemodialysis.  FOLLOW-UP:  She was to return to our office in one week for staple removal. Of course, she was to continue her hemodialysis on the same outpatient schedule of Tuesdays, Thursdays, and Saturdays. DD:  05/09/00 TD:  05/10/00 Job: [id number]

## 2010-06-04 NOTE — Discharge Summary (Signed)
[place]. [facility]  Patient:    [name], [name]                             MRN: [id number] Adm. Date:  [id number] Disc. Date: 08/23/99 Attending:  Alex Gardener Dictator:   Foye Clock, P.A.-C. CC:         [name]. Rise Patience, M.D.  [place]   Discharge Summary  ADMITTING DIAGNOSES: 1. Abdominal pain, questionable, recurrent, questionable early peritonitis. 2. End-stage renal disease on peritoneal dialysis. 3. Hypertension. 4. History of secondary hypoparathyroidism.  DISCHARGE DIAGNOSES: 1. Enterobacter cloacae, recurrent peritonitis. 2. End-stage renal disease, changed from PD to hemodialysis. 3. Anemia of chronic disease. 4. Secondary hypoparathyroidism. 5. Hypertension.  BRIEF HISTORY REASON FOR ADMISSION:  [name] is a 50 year old black female with end-stage renal disease secondary to pyelonephritis and hypertension with a recent history of Enterobacter peritonitis, currently on CAPD who was admitted on June 29th and discharged on July 7th after treatment of IV antibiotics for her peritonitis, and now presented after being awakened early in the morning with abdominal pain, diarrhea and vomiting. She stated her PD fluid was cloudy like it had heparin in it. Of note, she has finished a 14 day course of Cipro since her discharge. Also, was on Fortaz IP with PD fluid, with her last dose on July 27. In the ER her temperature was 99.2 degrees. She was laying in the fetal position on the stretcher complaining of abdominal pain. She was diffusely tender in all quadrants, more in the upper than lower. She had rebound tenderness. Rectal exam revealed heme-negative stool. Her Gram stain on the PD fluid showed gram-negative rods and PD fluid white cell count was 27, but this was after a third rapid exchange, which she had done at home, as instructed by the nephrologist on call to perform. Potassium was 2.3, bicarbonate 22, sodium  132. She was admitted for her significant abdominal pain, gram-negative rods in her PD fluid, questionable recurrent peritonitis. Abdominal films were to be obtained to rule out an acute abdomen, also to check amylase and lipase to rule out pancreatitis. She was to be treated with IV potassium because of her low potassium and followup potassium, also magnesium levels.  LABORATORY DATA:  The PD culture grew Enterobacter cloacae, and it was sensitive to Cipro. The second PD cell count, obtained the day of admission, was 1290 and rose to as high as 2850 the next morning. She had a serum protein. She had a serum protein level of 1181 with a hemoglobin of 11.9 on admission and 35.6. Hemoglobin fell to as low as 7.6 on August 6. She was transfused with two units of packed red blood cells, at that time.  Abdominal films showed normal bowel gas pattern, no active chest disease seen also.  HOSPITAL COURSE:  The patient was admitted by Dr. Jeneen Rinks L. Deterding, after presentation of questionable recurrent peritonitis, versus acute abdomen, versus enteritis, versus diverticulitis. Her abdominal film showed no acute abdominal process. Repeat PD cell count did show a white count over 1000. She was started on IV antibiotic coverage and Dr. Jeanella Anton was consulted because of recurrent peritonitis. PD catheter was removed on August 19, 1999 by Dr. Rise Patience without acute problems. As the PD catheter was removed Dr. Doren Custard inserted a vascular Ash cath for hemodialysis access. Abdominal pain improved with the patient tolerating solids and getting ready for discharge home on  August 23, 1999, to continue Cipro 500 mg p.o. for a two weeks total course of therapy.  The patient did have a hemoglobin drop to 7.6, thought to be related to her surgical procedures, transfused with two units of packed red blood cells with a slight rise in fever, but otherwise no complications. Will be placed on Epogen 10,000  units IV q. hemodialysis. It was noted her ferritin level was over 1000 at the time of discharge, not on any IV iron. At this time, will followup ferritin levels, transferrin saturations and C reactive proteins as an outpatient at the dialysis center in order to give her Epogen, and, also, the iron if needed.  ADDENDUM:  The patient had an Ash cath inserted on August 23, 1999, not the same day that her PD catheter was removed, but her IJ catheter was placed on August 23, 1999 by Dr. Kellie Simmering, a right IJ Ash cath without difficulties.  DISCHARGE INSTRUCTIONS:  Arrangements were made for outpatient dialysis to be performed. This was done to be done at [facility] and discharged on Tuesday, Thursday and Saturday schedule. For followup at the dialysis center for hemograms to be checked. Followup with Dr. Jimmy Footman as he instructed.  DISCHARGE MEDICATIONS: 1. Nephro-Vite one q.d. 2. Os-Cal 500 mg one a.c. 3. Cipro 500 mg one q.d. for seven more days. 4. Reglan 5 mg a.c. h.s. 5. Labetalol 200 mg b.i.d. - hold before dialysis on dialysis days. 6. Epogen 10,000 units q. dialysis. 7. Calcijex 0.5 mcg q. dialysis. Her dry weight at the time of    discharge was 60.5 kg and to be adjusted at the kidney center. DD:  10/21/99 TD:  10/22/99 Job: [id number]

## 2010-06-04 NOTE — Op Note (Signed)
NAMEMINIE, STETTLER                              ACCOUNT NO.:  [id number]   MEDICAL RECORD NO.:  [id number]                   PATIENT TYPE:  OIB   LOCATION:  2899                                 FACILITY:  [facility]   PHYSICIAN:  Rosetta Posner, M.D.                 DATE OF BIRTH:  Jan 04, 1961   DATE OF PROCEDURE:  09/13/2002  DATE OF DISCHARGE:  09/13/2002                                 OPERATIVE REPORT   PREOPERATIVE DIAGNOSIS:  End-stage renal disease, occluded left upper  arm  arteriovenous Gore-Tex graft.   POSTOPERATIVE DIAGNOSIS:  End-stage renal disease, occluded left upper  arm  arteriovenous Gore-Tex graft.   PROCEDURE:  Thrombectomy, revision of venous anastomosis and interoperative  arteriogram of left upper  arm AV Gore-Tex graft.   SURGEON:  Rosetta Posner, M.D.   ASSISTANT:  Lydia Guiles, P.A.   ANESTHESIA:  MAC.   COMPLICATIONS:  None, disposition to the recovery room stable.   DESCRIPTION OF PROCEDURE:  The patient was taken to the operating room and  placed in the supine position where the area of the left arm and left axilla  were prepped and draped in the usual sterile fashion. Using local anesthesia  an incision was made over the prior axillary incision and was carried down  to isolate the graft-to-vein anastomosis.   The graft was opened near  the venous anastomosis and there was a stenosis  at the venous anastomosis. A 5 dilator could pass with resistance through  this anastomosis. The vein was exposed and was occluded with a Cooley clamp.  The old venous anastomosis was completely taken down and the vein was  further spatulated open and the venous anastomosis was redone with a running  5-0 Prolene suture.   The incision in the graft was closed with a running 6-0 Prolene suture. The  clamp was removed and good thrill was noted. An interoperative arteriogram  was obtained and this showed intimal hyperplasia remnants at the rennet  intima at the graft   body. For this reason the graft was reopened and this  was removed with a 5 Fogarty catheter. The graft was flushed with  heparinized saline proximally  and distally and  was occluded.   The incision in the graft was closed with a running 6-0 Prolene suture. The  clamp was removed and excellent thrill was noted. The wound was irrigated  with saline. Hemostasis was achieved with electrocautery. The wounds were  closed with 3-0 Vicryl in the subcutaneous and subcuticular tissue. Benzoin  and Steri-Strips were applied.                                               Rosetta Posner, M.D.    TFE/MEDQ  D:  09/13/2002  T:  09/14/2002  Job:  [id number]

## 2010-06-04 NOTE — Op Note (Signed)
[place]. [facility]  Patient:    [name], [name]                             MRN: [id number] Proc. Date: 08/23/99 Attending:  Nelda Severe. Kellie Simmering, M.D.                           Operative Report  PREOPERATIVE DIAGNOSIS:  End-stage renal disease.  POSTOPERATIVE DIAGNOSIS:  End-stage renal disease.  OPERATION:  Insertion of Ash catheter right internal jugular vein.  SURGEON:  Nelda Severe. Kellie Simmering, M.D.  FIRST ASSISTANT:  Nurse  ANESTHESIA:  Local  DESCRIPTION OF PROCEDURE:  The patient was taken to the operating room, placed in the supine position at which time, the upper chest and neck area were prepped with Betadine scrub and solution and draped in a routine sterile manner.  After infiltration with 1% xylocaine, the patient was placed in the Trendelenburg position.  The right internal jugular vein entered percutaneously.  Guide wire passed into the right atrium under fluoroscopic guidance.  A 24 cm split Ash catheter was tunneled into this wound through an inferolateral stab wound.  After dilating tracts appropriately over the guide wire, the catheter was passed through the peel away sheath and positioned in the right atrium.  Peel away sheath was removed.  Position was confirmed fluoroscopically and both ports easily flushed with heparin and saline. Catheter was secured with nylon sutures and the wound closed with Vicryl in a subcuticular fashion.  Sterile dressing applied.  The patient was taken to the recovery room in satisfactory condition. DD:  08/23/99 TD:  08/23/99 Job: [id number]

## 2010-06-04 NOTE — Discharge Summary (Signed)
NAMESHADAWN, SCHWERING                              ACCOUNT NO.:  [id number]   MEDICAL RECORD NO.:  [id number]                   PATIENT TYPE:  INP   LOCATION:  6704                                 FACILITY:  [facility]   PHYSICIAN:  Donato Heinz, M.D.             DATE OF BIRTH:  [birth year]   DATE OF ADMISSION:  07/16/2002  DATE OF DISCHARGE:  07/22/2002                                 DISCHARGE SUMMARY   ADMISSION DIAGNOSES:  1. Abdominal pain, nausea, vomiting, diarrhea, unknown etiology,     questionable gastroenteritis, questionable acute abdomen, questionable     irritable bowel syndrome, questionable Clostridium difficile.  2. End-stage renal disease on chronic hemodialysis.  3. History of anemia.  4. Hypertension.  5. Secondary hyperparathyroidism.   DISCHARGE DIAGNOSES:  1. Micrococcus, blood culture positive.  2. Abdominal pain, nausea, vomiting, diarrhea/ diverticulosis.  3. End-stage renal disease on chronic hemodialysis.  4. History of anemia.  5. Hypertension.  6. Secondary hyperparathyroidism.   BRIEF HISTORY:  Ms. 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  She  presented to the ER complaining of acute onset last night of abdominal pain,  melena x 2, and diarrhea.  She went to hemodialysis, had no relief with  Phenergan and Imodium.  She was told to come to the ER later by Dr.  Jimmy Footman who saw her at the kidney center. She denies fevers and chills.  Yesterday she had a normal bowel movement and was doing well until acutely  at 10 p.m. some times occurred.  She denies any melena, denies any dysuria,  and is anuric.  In the ER, she was vomiting up greenish bile material with  significant abdominal discomfort requiring IV pain medications.   A CT scan was done in the ER, and Dr. Hassell Done discussed with Dr. Helene Kelp,  radiologist, diagnosis of possible ileitis, thickening of the bowel wall  most  prominent in the distal ileum.  No noticeable abscess.  The patient was  admitted by Dr. Hassell Done with GI consult to be obtained with questionable  inflammatory bowel disease.  Her abdominal films showed no obstruction.   LABORATORY DATA:  Pertinent labs on admission showed a white count of 7.9,  hemoglobin 16.6.  Potassium was 5.2.  Albumin 3.8, calcium 8.9.  LFTs were  normal.  Amylase 113.  Sodium 134, bicarb 22, chloride 98, BUN 23,  creatinine 12.7, glucose 97.   Upright and plain films of the abdomen did show diverticulosis of the left  colon.   EKG showed sinus bradycardia at the rate of 59, nonspecific ST-T wave  changes.   Blood cultures showed 1:2 positive for micrococcus.  Stool for C. difficile  negative.  Stool collected for Giardia and Yersinia cultures.    HOSPITAL COURSE:  1. ABDOMINAL PAIN, NAUSEA, VOMITING, DIARRHEA:  The patient was seen  in     consultation by [facility], Dr. Delfin Edis and Dr. Lucio Edward.  The     patient had colonoscopy in attempt viewing of the ileum which was     abnormal on CT scan.  She was vomiting small sips of liquids.     Colonoscopy was performed on 07/20/2002 when she was able to tolerate the     prep.  It showed mild diverticulosis, and ileocecal valve opening was     normal.  The terminal ileum was not well visualized.  Blood cultures were     positive for micrococcus.  Dr. Jonnie Finner consulted briefly with infectious     disease, Dr. Everlene Balls.  Dr. Quentin Cornwall noted that micrococcus is a GI     organism, not likely a contaminate.  Thus, GI ordered a small-bowel     follow-through.  This was ordered on July 21, 2003.  Cardiology said they     would not do small-bowel follow-through unless emergently needed on July 22, 2002.  Symptomatically, however, the patient was feeling well,     tolerating all solids on July 4 and also July 22, 2002, and was deemed     ready for discharge with further followup with small-bowel follow-through      as an outpatient as approved by Dr. Delfin Edis.  Dr. Olevia Perches did     recommend starting the patient on Cipro p.o., and this was done for a 10-     day course and continue Cipro as an outpatient.  Will schedule small-     bowel follow-through on Wednesday, July 7.  Also, Protonix, which is a     new medication, will be added to outpatient regimen.  Hemoglobin remained     stable throughout hospital stay and was 14.4 on July 3, pre-hemodialysis.   1. MICROCOCCUS:  Blood cultures obtained during admission did show that she     had 1 out of 2 blood cultures positive for micrococcus.  As noted, she     did have staph sepsis in April.  Her PermCath, which was present for     hemodialysis, was nontender.  No discharge was expressed.  The patient's     white count was normal throughout hospital stay.  It was noted by Dr.     Jonnie Finner, who consulted with Dr. Quentin Cornwall about this organism, and Dr.     Quentin Cornwall noted patient could be treated with IV Zosyn or Augmentin.  The     patient was on vancomycin at this time and would continue as an     outpatient.  Will not change to Augmentin because of GI side effects.     The patient has allergy to PENICILLIN.  Will continue vancomycin for two     weeks as noted by Dr. Marval Regal and follow up blood cultures off IV     vancomycin.  If blood cultures are negative, the patient needs her A-V     Gore-Tex graft appointment rescheduled with [facility].  The patient was to     have A-V Gore-Tex graft placed as an outpatient on July 2; however, she     was in hospital with his admission and, with positive blood cultures, it     was not done.  Again, PermCath used during this admission functioned well     with no signs of acute infection, and it was not changed.   1. HYPERTENSION:  The patient was mildly hypertensive  on the day of     discharge, 139/89, thought due to volume, not taken off hemodialysis.    The patient had decreased p.o. intake and was not really dialyzed  below     her dry weight.  She agrees that she needs to have more weight removed     with next hemodialysis treatment.  Her dry weight will be 78 kg compared     to 81 kg as an outpatient.  Followup blood pressure and dry weight at the     outpatient dialysis on Tuesday, Thursday, and Saturday at [facility].   1. SECONDARY HYPERPARATHYROIDISM:  The patient was continued on Calcitriol     infusion with dialysis.  Phosphorus was mildly elevated this admission.     As an outpatient, she was not on any phosphorus binders with the     phosphorus controlled.  Phosphorus was 6.3 on July 3 with a calcium of     7.8 and albumin of 3.2.  Will continue patient's Calcitriol 1.25 mcg each     hemodialysis, and patient was placed on Tums ulcer 1 a.c. while followup     as an outpatient at the kidney center.   DISCHARGE MEDICATIONS:  1. Cipro 500 mg 1 daily for 8 more days  2. Nephro-Vite 1 daily.  3. Protonix 40 mg q.h.s., 1 a.c.  4. Quinine sulfate 325 mg q.8h. p.r.n. cramps.  5. Vancomycin 750 mg at the office for 2 weeks total with last dosing on     August 01, 2002.  6. Calcitriol 1.25 mcg IV each dialysis.   DIET:  Low-fat diet.  Limit all fluids to 5 cups only each day.   FOLLOW UP:  Follow up at [facility] Tuesday, Thursday,  and Saturday with a new dry weight of 70 kg.   Also, the patient should go to [facility] X-ray Department for small-  bowel follow-through on Wednesday, July 7, time still not called back by  radiology.  Will have this called to the floor, and the patient will have  this time before she is discharged to home.     Foye Clock, P.A.                    Donato Heinz, M.D.    DWZ/MEDQ  D:  07/22/2002  T:  07/22/2002  Job:  [id number]   cc:   [facility]   Delfin Edis, M.D. [facility]    cc:   [facility]   Delfin Edis, M.D. [facility]

## 2010-06-04 NOTE — H&P (Signed)
[place]  Patient:    [name], [name]                             MRN: [id number] Adm. Date:  [id number] Attending:  Lance Sell                         History and Physical  HISTORY OF PRESENT ILLNESS:  This is a 50 year old black female with end-stage renal disease, who was admitted with fevers, nausea, abdominal pain, and watery diarrhea.  Her history is relevant in that she was hospitalized August 1-6 with persistent Enterobacter cloacae peritonitis, having been treated previously as an inpatient June 29-July 7 and then on outpatient antibiotics from July 7-29.  She had her PD catheter removed on August 2.  Ash catheter was placed, and she was started on hemodialysis.  She did not have a CT of the abdomen done during her August 1-6 admission but did during her initial presentation with gram-negative peritonitis, and CT scan done on July 2 was negative for abscess.  She actually had a temperature recorded of 100.7 on the day of discharge, August 6, but this was attributed to packed red cells.  Since her discharge on the 6th, she had continued problems with anorexia and nausea.  She has had low-grade fevers since discharge and while taking Cipro, a course of which was completed August 14, temperatures were in the 99.1-99.3 range.  She completed Cipro on August 14 and since that time, temperatures have persisted as high as 100.1.  She is also having frequent yellow, watery diarrheal stools with increasing lower abdominal pain.  She is due for TPA for her dialysis catheter today due to an obstructed catheter and came here instead because she felt so bad.  PAST MEDICAL HISTORY: 1. 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    a. On hemodialysis.    b. Status post peritonitis, recurrent, with Enterobacter plus or other       organisms; failed peritoneal dialysis; peritoneal dialysis catheter       removed September 06, 1999.    c. History of failed  cadaveric renal transplant. 2. 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 3. Chronic hypokalemia. 4. Secondary hyperparathyroidism with PTH greater than 900 July 2001.  ALLERGIES:  PENICILLIN, which causes a rash, and is intolerant to CODEINE, which causes nausea and vomiting.  MEDICATIONS:  Nephro-Vite, Darvocet, Reglan 5 mg a.c. and h.s., and labetalol 200 mg b.i.d.  DIALYSIS INFORMATION:  She dialyzes Tuesday, Thursday, Saturday, 2-1/2 hours, _____ dialyzer, 400 blood flow, 800 dialysate flow, 3.7 potassium bath, dry weight 60.5.  EPO 10,000 units.  Calcijex 0.5 units.  PHYSICAL EXAMINATION:  GENERAL:  On exam, she is mildly uncomfortable-appearing.  VITAL SIGNS:  Blood pressure 163/106, temperature 98.6, having been 100.5 earlier at home.  NECK:  She has no JVD.  Right IJ catheter is in place.  LUNGS:  Clear.  CARDIAC:  Mild tachycardia, S1, S2, positive S4.  A 1/6 murmur along the left sternal border.  No diastolic murmur.  No pericardial rub.  ABDOMEN:  Mildly distended.  Bowel sounds are very hypoactive.  She has moderate diffuse lower abdominal tenderness without rebound.  EXTREMITIES:  There is no edema of the lower extremities.  LABORATORY DATA:  Pending.  Three-way of the abdomen shows a clear chest, nonobstructive bowel gas pattern, and  no evidence for free air.  IMPRESSION:  Nausea, vomiting, diarrhea, abdominal pain, and fevers in this patient, who has had prolonged antibiotic therapy as well as peritoneal dialysis catheter removal for gram-negative rod peritonitis.  Differential diagnosis includes abscess, colitis secondary to Clostridium difficile, or even matting and sclerosis of the bowel secondary to inflammation resulting in bowel dysfunction, malabsorption, etc.  PLAN:  Admit to 5500.  Obtain CT of the abdomen to rule out abscess or multiple loculated abscesses.  Check stools for C. difficile.  After  two stools have been collected, will empirically start oral Flagyl but hold off on systemic antibiotics until we can better direct her therapy, i.e., if she has evidence for an abscess, get that drained and obtain specimens for culture, etc.  Will dialyzed on August 21 on her usual Tuesday, Thursday, Saturday schedule.  Will give TPA today via her catheter to see if we can make this more functional, and follow up labs when available. DD:  09/06/99 TD:  09/07/99 Job: [id number]

## 2010-06-04 NOTE — H&P (Signed)
[place]  Patient:    [name], [name]                             MRN: [id number] Adm. Date:  [id number] Attending:  Franchot Erichsen Dictator:   Foye Clock, P.A. 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  HISTORY OF PRESENT ILLNESS: She stated her PD fluid could not really be described as cloudy but fibrin contained.  She did three exchanges and brought in her third exchange to the ER.  She reports chills this morning, no recorded fever.  No melena reported.  She had just finished a 14 day course of Cipro from discharge and also [place] IP fluids, also a 14 day course, just finished last Friday, August 13, 1999.  In the ER she had abdominal pain and was in the fetal position, and vomiting x 2 as reported by the R.N. before IV Phenergan helped somewhat.  MEDICATIONS: (On discharge on July 24, 1999)  1. Cipro 500 mg q.d. for 14 days.  2. Tressie Ellis 1.5 g IP fluid q.d.  3. Rocaltrol 0.5 mcg 4 pills q.d.  4. Protonix 40 mg q.d.  5. Reglan 5 mg q.i.d. a.c. and h.s.  6. Epogen 10,000 units subQ q.d. pending reassessment.  7. Calcium carbonate 500 mg 1 a.c.  8. Labetalol 200 mg t.i.d.  9. Nephro-Vite 1 q.d. 10. She had previously been on K-Dur and was told to hold this until further     notice.  ALLERGIES:  1. PENICILLIN causing hives.  2. CODEINE causing hives.  PAST MEDICAL HISTORY:  1. 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  Currently the patients  end-stage renal disease     is maintained on CAPD, and Dr. Hassell Done is the nephrologist who follows her for     this.  2. History of cadaveric renal transplant, status post failed transplant     in 1994.  3. History of malignant hypertension requiring hospitalization with     encephalopathy and cortical blindness, October 1999.  4. History of chronic hypokalemia, with patient on intermittent K-Dur.  5. History of secondary hyperparathyroidism, with PTH decreased from 18,000     to 12,000 in April 2001.  The patient is on Rocaltrol.  6. History of peritonitis with recent admission, with history of being     treated with IP antibiotics on and off since April 2001.  PD fluid grew     out jeikeium bacterium and she was placed on vancomycin at that time,     finishing August 13, 1999.  PHYSICAL EXAMINATION:  VITAL SIGNS: In the ER her temperature was 99.2 degrees, blood pressure 142/100, heart rate 101 and regular, respirations 19.  GENERAL: Well-developed, well-nourished black female in moderate distress secondary to abdominal pain, lying in the fetal position on her stretcher.  HEENT: Head normocephalic.  PERRLA.  Signs of early cataracts.  Ears unremarkable.  Nose and throat unremarkable.  NECK: Supple with no adenopathy noted.  LUNGS: Clear to auscultation.  CARDIAC: Regular rate and rhythm auscultated, S1 and S2, S4.  Grade 2/6 systolic ejection murmur at left sternal border.  ABDOMEN: Bowel sounds positive and somewhat decreased.  Diffusely tender in all quadrants, upper more than lower.  EXTREMITIES: No pedal edema noted.  Pulses 2+ in all extremities.  RECTAL: Brown stool, Hemoccult negative.  LABORATORY DATA: PD fluid she brought in, which was her third rapid exchange from home, showed WBC only 27.  Gram stain was called in with gram-negative rods.  On serum CBC her WBC was 10,000, hemoglobin 11.5; platelets 365,000. Albumin 2.7.  Sodium was 132, potassium 2.3, chloride 110,  bicarbonate 22.  ASSESSMENT/PLAN:  1. Abdominal pain, questionable early peritonitis but difficult to say with     WBC of only 27 on peritoneal dialysis fluid with a third exchange will     need to rule out.  We will perform a six hour dwell and recheck a     peritoneal dialysis cell count.  Will possibly consider enteritis versus     diverticulitis versus perforation, pancreatitis.  Check amylase and     lipase.  Check three-way portable abdominal film.  2. End-stage renal disease with low potassium.  Will give her intravenous     potassium and follow up on potassium level this afternoon.  Check a     magnesium level.  Continue q.4h peritoneal dialysis exchanges using     1.5 bags.  3. Hypertension.  Check orthostatic blood pressure checks and continue on     labetalol as-needed.  4. Hyperparathyroidism.  Continue on Rocaltrol.  May need to start     intravenous Calcijex if she cannot keep Rocaltrol down. DD:  08/18/99 TD:  08/19/99 Job: [id number]

## 2010-06-04 NOTE — H&P (Signed)
[name], [name]                              ACCOUNT NO.:  [id number]   MEDICAL RECORD NO.:  [id number]                   PATIENT TYPE:  INP   LOCATION:  5004                                 FACILITY:  [place]   PHYSICIAN:  Sallyanne Havers, M.D.                 DATE OF BIRTH:  12-11-1960   DATE OF ADMISSION:  11/28/2002  DATE OF DISCHARGE:                                HISTORY & PHYSICAL   CHIEF COMPLAINT:  Abdominal pain.   HISTORY OF PRESENT ILLNESS:  This 50 year old African American female with  end-stage renal disease on hemodialysis as well as hypertension and history  of pancreatitis presents with crampy abdominal pain which woke her from  sleep at 04:00 today and she had loose bowel movements which then persisted  throughout the day. The patient was given Imodium at the hemodialysis center  today, then began nausea and vomiting afterwards at 11:30, and could not  keep anything down.  So, she was told by the kidney center to come to [facility]. She denies hematemesis, hematochezia, fever, and  states he has slight chills.  She has not started any new medications. She  just got a prescription for Lipitor, but has not started it yet. She denies  any sick contacts. She states that her symptoms are similar to her prior  episode of pancreatitis.   REVIEW OF SYSTEMS:  Positive for 1 kg weight loss, anuria, and  lightheadedness. She denies any swelling, headache, blurred vision,  shortness of breath, chest pain, or rash.   PAST MEDICAL HISTORY:  1. End-stage renal disease secondary to glomerulonephritis--hemodialysis at     [facility] on Tuesday, Thursday, and Saturday; the patient has     failed kidney transplant in the past.  2. Hypertension.  3. History of pancreatitis in December 2002--idiopathic per patient.  4. Hyperlipidemia.   MEDICATIONS:  1. Lisinopril 10 mg one-half tab daily.  2. Nephro-Vite one daily.  3. Os-Cal 500 mg t.i.d. with  meals.  4. Protonix 40 mg daily.   ALLERGIES:  1. PENICILLIN--hives.  2. CODEINE--hives.   FAMILY HISTORY:  Positive for diabetes in an aunt. No cancer or kidney  disease in the family.   SOCIAL HISTORY:  The patient lives in [place] with his mother and father,  one child, no alcohol or tobacco use. The patient is currently on disability  and is independent of all ADL.   PHYSICAL EXAMINATION:  VITAL SIGNS: Temperature 98.6, pulse 70, respirations  22, blood pressure 146/105. Oxygen saturation 99% on room air.  GENERAL: This patient is comfortable, in no acute distress, and is alert and  oriented times three.  HEENT: Normocephalic and atraumatic. Pupils equal, round, and reactive to  light. Extraocular motions intact.  Nonicteric. Dry mucous membranes.  Oropharynx clear.  NECK: Supple with no lymphadenopathy.  Trachea midline.  CARDIOVASCULAR: Regular rate  and rhythm without murmurs, rubs, or gallops.  LUNGS: Clear to auscultation bilaterally with good effort.  ABDOMEN: Distended, diffuse tenderness, hyperactive bowel sounds. No  rebound.  EXTREMITIES: 5/5 strength bilaterally.  2+ pedal pulses bilaterally.  Trace  lower extremity edema bilaterally.  NEUROLOGIC: Cranial nerves II-XII grossly intact. Deep tendon reflexes are  2+ bilaterally.   LABORATORY DATA:  Amylase 161, lipase 91, sodium 137, potassium 3.1,  chloride 97, bicarbonate 28, BUN 10, creatinine 7.2, glucose 117, calcium  9.4, albumin 4.3, total protein 8.9, alkaline phosphatase 86, AST 27, ALT  10, total bilirubin 0.5.  White blood count 8.7, hemoglobin 14.7, hematocrit  43.9, platelet count 266,000.   Acute abdominal series showed normal chest x-ray, nonspecific bowel gas  pattern, and a few air-fluid levels.   ASSESSMENT/PLAN:  A 50 year old African American female with end-stage renal  disease and hypertension who presents with nausea, vomiting, and diarrhea.  1. Nausea, vomiting, and diarrhea--given  elevated lipase this is most likely     another episode of pancreatitis, but most also consider acute     gastroenteritis. Will admit, keep NPO, give IV Phenergan and Protonix,     and recheck CMP as well as lipase in the morning.  If patient has no     improvement will search for etiologies such as cholelithiasis versus     elevated triglycerides, and will recheck old records for recent labs.     Will also consider CT scan to rule out pseudocyst if no improvement.  2. End-stage renal disease.  The patient received hemodialysis today and is     now hypokalemic most likely secondary to diarrhea. Will recheck potassium     in the morning and replete if necessary. Continue hemodialysis on     Tuesday, Thursday, and Saturday.  3. Hypertension--blood pressure currently elevated secondary to pain and     patient not taking her medications yet today. Will start lisinopril and     monitor.                                                Sallyanne Havers, M.D.    AS/MEDQ  D:  11/28/2002  T:  11/28/2002  Job:  [id number]   cc:   [facility]

## 2010-06-04 NOTE — H&P (Signed)
[name], [name] NO.:  [id number]   MEDICAL RECORD NO.:  [id number]          PATIENT TYPE:  INP   LOCATION:  1846                         FACILITY:  [place]   PHYSICIAN:  Windy Kalata, M.D.DATE OF BIRTH:  07/06/1960   DATE OF ADMISSION:  06/23/2004  DATE OF DISCHARGE:                                HISTORY & PHYSICAL   REASON FOR ADMISSION:  Pancreatitis.   HISTORY OF PRESENT ILLNESS:  This is a 50 year old black female with end-  stage renal disease who complained of acute onset of diarrhea, then nausea  and vomiting followed by crampy mid abdominal pain last evening.  This is a  similar pain she had when she had pancreatitis in 2002 and again in 2004.  She denies any alcohol use.  She presented to the emergency room, was found  to have normal liver function tests and a lipase was elevated at 173.  Abdominal x-rays showed some air fluid levels in the small bowel.  She has  had an extensive work-up in the past for this history of pancreatitis with  CT scans, ultrasounds, MRCP, and capsule endoscopy all of which have been  unrevealing.   PAST MEDICAL HISTORY:  1.  End-stage renal disease.  2.  History of pancreatitis with negative work-up.  3.  History of hypertension.  4.  Questionable history of ACE inhibitor induced angioedema of the bowel.   ALLERGIES:  CODEINE which causes urticaria, PENICILLIN urticaria, ACE  INHIBITOR questionable angioedema of the bowel.   MEDICATIONS:  1.  Lipitor 10 mg q.h.s.  2.  Nephro-Vite one a day.  3.  Baby aspirin one a day.   SOCIAL HISTORY:  She is an ex-smoker, quit in 2003.  She denies any alcohol  use.  She is divorced, has one daughter.  She lives with her mother.   FAMILY HISTORY:  Unknown as she is adopted.   REVIEW OF SYSTEMS:  Appetite had been good up until yesterday.  She has no  shortness of breath, no chest pains.  Abdominal pain as noted above.  No  dysuria and no arthritic complaints.  No  neuropathic symptoms.  All in all,  the patient was in her usual state of health until the acute onset of this  abdominal pain yesterday.   PHYSICAL EXAMINATION:  VITAL SIGNS:  Blood pressure 119/67, pulse 73,  temperature 98.4.  GENERAL:  Healthy-appearing 50 year old black female in a moderate distress  secondary to nausea, vomiting, and abdominal pain.  HEENT:  Sclerae nonicteric.  Extraocular muscles are intact.  LUNGS:  Clear to auscultation.  NECK:  No JVD.  HEART:  Regular rate and rhythm without murmurs, rubs, or gallops.  ABDOMEN:  Positive bowel sounds.  Positive distention.  No palpable  splenomegaly.  There is mild diffuse tenderness, questionable mild rebound.  EXTREMITIES:  No clubbing, cyanosis, edema.  There is an AV graft in right  upper extremity with positive thrill and bruit.  NEUROLOGIC:  Cranial nerves intact.  Motor and sensory intact.  No  asterixis.   LABORATORIES:  Sodium 138,  potassium 3.7, bicarbonate 27, creatinine 9.2.  Liver function tests were completely normal.  Hemoglobin 14, white count 12.  Lipase 173, amylase 231.   IMPRESSION:  1.  Pancreatitis.  2.  Anemia on Epogen.  3.  Secondary hyperparathyroidism on Hectorol.   PLAN:  1.  I will keep her n.p.o. and rest her bowel.  2.  IV Phenergan p.r.n.  3.  IV morphine sulfate p.r.n.  4.  Recheck her laboratories in the morning.  Since she has had such an      extensive work-up I am not planning radiologic procedures at this time.      If her pancreatitis does not resolve rapidly, however, may need to      proceed with a repeat CT scan of her abdomen.      MTM/MEDQ  D:  06/23/2004  T:  06/23/2004  Job:  [id number]

## 2010-06-04 NOTE — H&P (Signed)
[name], [name] NO.:  [id number]   MEDICAL RECORD NO.:  [id number]          PATIENT TYPE:  INP   LOCATION:  [place]                         FACILITY:  [place]   PHYSICIAN:  Alvin C. Florene Glen, M.D.  DATE OF BIRTH:  06/05/1960   DATE OF ADMISSION:  12/24/2003  DATE OF DISCHARGE:                                HISTORY & PHYSICAL   HISTORY OF PRESENT ILLNESS:  [name] is a 50 year old woman who receives  hemodialysis at [facility] on Tuesdays, Thursdays, and  Saturdays due to end-stage renal disease.  She presents today in private  vehicle from home with a 6-hour history of chills, low-grade temperature  (99.1), nausea with yellow vomit, abdominal pain, and diarrhea.  She denies  rhinorrhea or sore throat, but has had nonproductive cough x2 weeks.  She  does not produce urine.  This is not new for her.  Her last menstrual period  was September 2005, and has been irregular for a while.  She states she is  sexually inactive.  She had a thrombectomy and right upper arm AV graft on  November 18, 2003, but denies pain or drainage at graft site.  Of note, she  did have hemodialysis today, Wednesday.  She had dialysis yesterday on her  regular schedule of Tuesdays, Thursdays, and Saturdays.  She had dialysis  today because she left dialysis last Saturday over her dry weight, and felt  that she was fluid overloaded, even though she denies shortness of breath or  pedal edema.   PAST MEDICAL HISTORY:  1.  End-stage renal disease.  2.  Dyslipidemia.  3.  Hypertension.  4.  History of pancreatitis with only slight elevation of lipase during last      [place] hospitalization in November of 2004.  Ultrasound at that time      was negative for gallbladder or pancreatic disease.  5.  In February of 2005, MRCP normal, but numerous bilateral renal cysts.   ALLERGIES:  1.  CODEINE causes urticaria.  2.  PENICILLIN causes urticaria.   SOCIAL HISTORY:  Worked  in a Conservation officer, historic buildings.  Divorced.  Lives with parents.  Quit tobacco in 2003.  Denies alcohol use.  Has 1 grown daughter.   FAMILY HISTORY:  Daughter alive and well at 81 years.  Unknown history of  parents, as she was adopted.   MEDICATIONS:  1.  Lisinopril 10 mg p.o. q.h.s.  2.  Lipitor 10 mg p.o. q.h.s.  3.  Nephro-Vite one tablet p.o. daily.  4.  Aspirin 81 mg p.o. daily.  (Denies using binders.)   REVIEW OF SYSTEMS:  See HPI and past medical history.  Negative for chest  pain, shortness of breath, palpitations, rash, bloody stools, recent falls,  prolonged bleeding, history of diabetes mellitus, thyroid disease, mass, or  joint pain.   LABORATORY DATA:  Labs in [facility] ED today revealed white count of 13.4,  hemoglobin 13.3, platelets 250, sodium 138, potassium 4.1, chloride 96, CO2  of 29, BUN 19, creatinine 7.9, glucose 103, TP 8.9, albumin  4.1, AST 26, ALT  9, alkaline phosphatase 63, total bilirubin 0.7, amylase 198, lipase 46.   PHYSICAL EXAMINATION:  VITAL SIGNS:  Temperature 97.7, 99% on room air,  blood pressure 121/73, pulse 81, and respirations 16, using a regular cuff,  left upper arm.  GENERAL APPEARANCE:  Alert and oriented x3.  In no acute distress, although  appears uncomfortable.  HEENT:  Atraumatic and normocephalic.  Nares patent.  Pupils equal, round,  and reactive to light and accommodation.  HEART:  Regular rate and rhythm without clicks, gallops, or rubs.  LUNGS:  Clear to auscultation bilaterally without wheezing, rhonchi, or  rales.  ABDOMEN:  Positive bowel sounds, tender right upper quadrant greater than  left upper quadrant.  No rebound.  Soft.  EXTREMITIES:  No edema.  SKIN:  Dry but no rash.  Access right upper arm AV graft positive thrill and  bruit.  No signs of infection.   ASSESSMENT AND PLAN:  1.  Gastroenteritis versus pancreatitis versus abdominal cramps secondary to      hemodialysis.  A 3-way abdomen was negative.  I favor  gastroenteritis.      Will check blood cultures x2, provide bowel rest, PPI IV, Phenergan IV,      and Imodium, and will also get an abdominal CT.  2.  Nonproductive cough.  Provide an anti-tussive.  3.  End-stage renal disease.  Hemodialysis yesterday and today.  We will      hold off dialyzing her tomorrow.  We will get her back on schedule prior      to discharge.  4.  Anemia.  Hemoglobin is stable today.  Will get hemodialysis information      from [place].  5.  Renal osteodystrophy.  Again, I will get information from [facility].  6.  Hypertension, stable.  Will give home dose lisinopril; however, will      hold if systolic blood pressure less than or equal to 110, or heart rate      less than or equal to 70.      Mart   MJG/MEDQ  D:  12/24/2003  T:  12/24/2003  Job:  [id number]

## 2010-06-04 NOTE — Op Note (Signed)
[place]. [facility]  Patient:    SIVANA, KOLENOVIC Visit Number: [id number] MRN: [id number]          Service Type: OUT Location: [place] Attending Physician:  Dennis Bast Dictated by:   Rosetta Posner, M.D. Proc. Date: 02/21/01 Admit Date:  02/19/2001 Discharge Date: 02/19/2001                             Operative Report  PREOPERATIVE DIAGNOSIS:  End-stage renal disease.  POSTOPERATIVE DIAGNOSIS:  End-stage renal disease.  OPERATION PERFORMED:  Placement of left internal jugular Ash catheter, also Dr. Rise Patience placed a peritoneal dialysis catheter during the same anesthesia.  SURGEON:  Rosetta Posner, M.D.  ASSISTANT:  ANESTHESIA:  General endotracheal.  COMPLICATIONS:  None.  DISPOSITION:  To recovery stable.  DESCRIPTION OF PROCEDURE:  The patient was taken to the operating room and placed in supine position where the area of the right and left neck and chest were prepped and draped in the usual sterile fashion.  The patient was placed in Trendelenburg position and using a finder needle the left internal jugular vein was identified.  Next using Seldinger technique, a guide wire was passed down to the level of the right atrium.  A separate incision was made further distal on the left chest and a 28 cm Ash catheter was brought through the subcutaneous tunnel.  A dilator and peel-away sheath was passed over the guide wire.  The dilator and guide wire were removed.  The catheter was passed down the peel-away sheath which was then removed as well.  Both lumens flushed and aspirated easily and were locked with 1000 unit per cc heparin.  The catheter was secured to the skin with 3-0 nylon stitch.  A sterile dressing was applied.  The patient was then prepped for peritoneal dialysis catheter by Dr. Rise Patience to be dictated as a separate note. Dictated by:   Rosetta Posner, M.D. Attending Physician:  Dennis Bast DD:  02/21/01 TD:   02/21/01 Job: [id number]

## 2010-06-04 NOTE — Op Note (Signed)
NAME:  [name], [name]                              ACCOUNT NO.:  [id number]   MEDICAL RECORD NO.:  [id number]                   PATIENT TYPE:  OIB   LOCATION:  2899                                 FACILITY:  [place]   PHYSICIAN:  Judeth Cornfield. Scot Dock, M.D.        DATE OF BIRTH:  December 13, 1960   DATE OF PROCEDURE:  07/08/2003  DATE OF DISCHARGE:  07/08/2003                                 OPERATIVE REPORT   PREOPERATIVE DIAGNOSIS:  Chronic renal failure with clotted left upper arm  arteriovenous graft.   POSTOPERATIVE DIAGNOSIS:  Chronic renal failure with clotted left upper arm  arteriovenous graft.   OPERATION PERFORMED:  1. Thrombectomy of left upper arm arteriovenous graft.  2. Insertion of new segment of graft to an adjacent brachial vein.  3. Intraoperative fistulagram.   SURGEON:  Judeth Cornfield. Scot Dock, M.D.   ASSISTANT:  Nurse.   ANESTHESIA:  Local with sedation.   DESCRIPTION OF PROCEDURE:  The patient was taken to the operating room,  sedated by anesthesia.  The left upper extremity was prepped and draped in  the usual sterile fashion.  After the skin was infiltrated with 1%  lidocaine, the incision over the venous anastomosis was opened and the  venous limb of the graft dissected free.  The anastomosis was fairly high up  on the brachial vein in the axilla.  The vein above the anastomosis was  quite small.  I dissected the vein further proximally way up into the axilla  but it was not any larger at this level.  I therefore, elected to look for  an adjacent brachial vein.  The adjacent brachial vein was deeper and it was  larger but it was not especially large but it was felt that this was the  only option to salvage this graft.  The graft was then divided.  The graft  thrombectomy was achieved using a #4 Fogarty catheter.  The arterial plug  was retrieved and excellent flow established through the graft.  The patient  had been heparinized.  The graft was flushed with  heparinized saline and  clamped.  A new segment of 6 mm PTFE was brought to the field, sewn end-to-  end to the old graft using continuous 6-0 Prolene suture.  The new brachial  vein was ligated distally and then spatulated.  The graft was cut to the  appropriate length, spatulated and sewn end-to-end to the vein using  continuous 6-0 Prolene suture.  At the completion there was a good thrill in  the graft. Intraoperative fistulagram was obtained which showed no technical  problems.  Hemostasis was obtained in the wound.  The wound was closed with  a deep layer of 3-0 Vicryl and the skin closed with 4-0 Vicryl.  A sterile  dressing was applied, the patient tolerated the procedure well and was  transferred to the recovery room in satisfactory condition.  All needle and  sponge counts were correct.                                               Judeth Cornfield. Scot Dock, M.D.    CSD/MEDQ  D:  07/08/2003  T:  07/09/2003  Job:  [id number]

## 2010-06-04 NOTE — Op Note (Signed)
[place]. [facility]  Patient:    [name], [name] Visit Number: [id number] MRN: [id number]          Service Type: Attending:  Rosetta Posner, M.D. Proc. Date: 09/25/00                             Operative Report  PREOPERATIVE DIAGNOSIS:  End-stage renal disease.  POSTOPERATIVE DIAGNOSIS:  End-stage renal disease.  OPERATION PERFORMED:  Creation of left upper arm arteriovenous fistula.  SURGEON:  Rosetta Posner, M.D.  ASSISTANT:  Sheliah Hatch, P.A.  ANESTHESIA:  MAC  COMPLICATIONS:  None.  DISPOSITION:  To recovery room stable.  DESCRIPTION OF PROCEDURE:  The patient was taken to the operating room and placed in supine position where the area of the left arm was prepped and draped in the usual sterile fashion.  The patient had had a prior left forearm loop arteriovenous graft which has been nonfunctional for some time.  The antecubital incision was opened after local anesthesia and the patient had a large cephalic vein.  The old nonfunctional graft was end-to-side to the cephalic vein and the vein was of large caliber both proximal and distal to the old Gore-Tex anastomosis.  The old Gore-Tex graft was divided and the cephalic vein was mobilized.  The vein was ligated distally and divided.  A 5 dilator passed easily through the vein to the midupper arm.  The vein was flushed with heparinized saline without resistance as well.  The old anastomosis was left intact and was oversewn and this did not narrow the vein. The brachial artery was exposed through the same incision.  This was encircled with a vessel loop.  The artery was occluded proximally and distally, was opened with an 11 blade and extended longitudinally with Potts scissors.  The vein was cut to the appropriate dimensions and was sewn end-to-side to the artery with a running 6-0 Prolene suture.  Clamps were removed and excellent thrill was noted in the fistula.  The wounds were irrigated with  saline and hemostasis was obtained with electrocautery.  Wounds were closed with 3-0 Vicryl in the subcutaneous and subcuticular tissue.  Benzoin and Steri-Strips were applied. Attending:  Rosetta Posner, M.D. DD:  09/25/00 TD:  09/25/00 Job: [id number]

## 2010-06-04 NOTE — Op Note (Signed)
   NAME:  [name], [name]                              ACCOUNT NO.:  [id number]   MEDICAL RECORD NO.:  [id number]                   PATIENT TYPE:  INP   LOCATION:  5506                                 FACILITY:  [place]   PHYSICIAN:  Rosetta Posner, M.D.                 DATE OF BIRTH:  02/01/1960   DATE OF PROCEDURE:  03/13/2002  DATE OF DISCHARGE:                                 OPERATIVE REPORT   PREOPERATIVE DIAGNOSES:  1. End-stage renal disease.  2. Occluded left upper arm arteriovenous fistula.   POSTOPERATIVE DIAGNOSES:  1. End-stage renal disease.  2. Occluded left upper arm arteriovenous fistula.   OPERATION:  Thrombectomy of left upper arm arteriovenous fistula.   SURGEON:  Rosetta Posner, M.D.   ASSISTANT:  Nurse.   ANESTHESIA:  MAC.   COMPLICATIONS:  None.   DISPOSITION:  The patient went to the recovery room and then to x-ray for a  scintigram through the fistula.   DESCRIPTION OF PROCEDURE:  The patient was taken to the operating room and  placed on the table. The left arm was prepped and draped in the usual  sterile fashion.  Using local anesthesia, an incision was made over the  prior arteriovenous anastomosis.  The vein was quite large throughout this  area.  The vein still had a pulse at this level and it was thrombosed  further proximally.   The graft was occluded with a baby Gregory clamp and the vein was opened  transversely near the arterial anastomosis.  The vein was thrombectomized.  There was a great deal of thrombus.  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  The catheter could be passed into the central system  but there did appear to be a hangup at the entry into the subclavian vein.  When no further thrombus was removed, this was flushed with heparinized  saline and reoccluded.   The incision in the vein was closed with running 6-0 Prolene suture.  The  clamps were removed and pulsatile flow was noted.  However, there was  audible Doppler  flow through the fistula at the level of the shoulder.  The  wounds  were irrigated with saline.  Hemostasis with electrocautery.  The wounds  were closed with 3-0 Vicryl in the subcutaneous and subcuticular tissue.  Benzoin and Steri-Strips were applied.   The patient was taken to the recovery room in stable condition.                                               Rosetta Posner, M.D.    TFE/MEDQ  D:  03/13/2002  T:  03/13/2002  Job:  [id number]

## 2010-06-04 NOTE — Op Note (Signed)
NAME:  Margaret, [name]                              ACCOUNT NO.:  [id number]   MEDICAL RECORD NO.:  [id number]                   PATIENT TYPE:  OIB   LOCATION:  2858                                 FACILITY:  [place]   PHYSICIAN:  Nelda Severe. Kellie Simmering, M.D.               DATE OF BIRTH:  [birth year]   DATE OF PROCEDURE:  08/23/2002  DATE OF DISCHARGE:  08/23/2002                                 OPERATIVE REPORT   PREOPERATIVE DIAGNOSES:  1. Thrombosed arteriovenous Gore-Tex, left upper arm.   POSTOPERATIVE DIAGNOSES:  1. Thrombosed arteriovenous Gore-Tex, left upper arm.   OPERATION:  1. Thrombectomy arteriovenous Gore-Tex graft, left upper arm, with revision     of venous end.  2. Interoperative shuntogram, left arm.   SURGEON:  Nelda Severe. Kellie Simmering, M.D.   FIRST ASSISTANT:  Nurse.   ANESTHESIA:  Local.   DESCRIPTION OF PROCEDURE:  The patient was taken to the operating room and  placed in the supine position at which time the left upper extremity was  prepped with Betadine scrub and solution and draped in the routine sterile  manner.  After infiltration with 1% Xylocaine, the previous incision in the  proximal upper arm near the axilla was reopened.  Gore-Tex axillary vein  anastomosis dissected free; 3000 units of heparin given intravenously.  Transverse opening was made in the graft over the anastomotic site, and the  graft was filled with fresh thrombus.  Venous anastomosis was an end-to-end  anastomosis and was easily thrombectomized with the vein being 5 mm in size  and a Fogarty easily traversing it.  The graft itself was easily  thrombectomized with excellent inflow being reestablished.  On delivering  the Fogarty through the graft, which was a new graft, some of the graft was  delivered into the wound, making this somewhat redundant at the venous end,  and it was decided to shorten this slightly and move it up on the vein more  proximally.  After resecting the previous anastomosis  and spatulating the  vein and graft, a new anastomosis was done with 6-0 Prolene.  When this was  completed, there was an excellent pulse and thrill in the graft.  Intraoperative shuntogram was performed which revealed the graft to be  widely patent and the arterial anastomosis to be widely patent.  Following  completion of this, the wound was closed with Vicryl in subcuticular  fashion.  Sterile dressing applied, and patient taken to the recovery room  in satisfactory condition.                                               Nelda Severe Kellie Simmering, M.D.    JDL/MEDQ  D:  08/23/2002  T:  08/23/2002  Job:  [id number]

## 2010-10-22 LAB — COMPREHENSIVE METABOLIC PANEL
ALT: <8
AST: 14
Albumin: 3.6
Alkaline Phosphatase: 48
Alkaline Phosphatase: 60
BUN: 12
BUN: 18
BUN: 27 — ABNORMAL HIGH
CO2: 26
CO2: 29
Chloride: 92 — ABNORMAL LOW
Chloride: 97
Chloride: 98
Creatinine, Ser: 11.98 — ABNORMAL HIGH
Creatinine, Ser: 6.7 — ABNORMAL HIGH
GFR calc non Af Amer: 3 — ABNORMAL LOW
GFR calc non Af Amer: 7 — ABNORMAL LOW
Glucose, Bld: 108 — ABNORMAL HIGH
Glucose, Bld: 81
Potassium: 3.9
Sodium: 138
Total Bilirubin: 0.8
Total Bilirubin: 0.9
Total Bilirubin: 1.1

## 2010-10-22 LAB — POCT I-STAT CREATININE
Creatinine, Ser: 7.4 — ABNORMAL HIGH
Operator id: 146091

## 2010-10-22 LAB — CBC
HCT: 39.1
MCV: 90.9
Platelets: 211
RBC: 4.31
RDW: 14.1
WBC: 11.5 — ABNORMAL HIGH
WBC: 5.1

## 2010-10-22 LAB — I-STAT 8, (EC8 V) (CONVERTED LAB)
Acid-Base Excess: 10 — ABNORMAL HIGH
Chloride: 103
pCO2, Ven: 25.7 — ABNORMAL LOW
pH, Ven: 7.667

## 2010-10-22 LAB — LIPASE, BLOOD
Lipase: 31
Lipase: 93 — ABNORMAL HIGH

## 2010-10-22 LAB — AMYLASE
Amylase: 110
Amylase: 135 — ABNORMAL HIGH
Amylase: 94

## 2010-10-22 LAB — DIFFERENTIAL
Basophils Absolute: 0
Eosinophils Relative: 1
Lymphocytes Relative: 13
Neutro Abs: 9.4 — ABNORMAL HIGH

## 2010-10-22 LAB — RENAL FUNCTION PANEL
Albumin: 3.1 — ABNORMAL LOW
BUN: 22
Calcium: 8.7
Phosphorus: 5.3 — ABNORMAL HIGH
Potassium: 3.7
Sodium: 135

## 2011-01-20 DIAGNOSIS — N186 End stage renal disease: Secondary | ICD-10-CM | POA: Diagnosis not present

## 2011-01-22 DIAGNOSIS — N186 End stage renal disease: Secondary | ICD-10-CM | POA: Diagnosis not present

## 2011-01-25 DIAGNOSIS — N039 Chronic nephritic syndrome with unspecified morphologic changes: Secondary | ICD-10-CM | POA: Diagnosis not present

## 2011-01-25 DIAGNOSIS — N186 End stage renal disease: Secondary | ICD-10-CM | POA: Diagnosis not present

## 2011-01-25 DIAGNOSIS — D631 Anemia in chronic kidney disease: Secondary | ICD-10-CM | POA: Diagnosis not present

## 2011-01-25 DIAGNOSIS — D509 Iron deficiency anemia, unspecified: Secondary | ICD-10-CM | POA: Diagnosis not present

## 2011-01-25 DIAGNOSIS — N2581 Secondary hyperparathyroidism of renal origin: Secondary | ICD-10-CM | POA: Diagnosis not present

## 2011-01-25 DIAGNOSIS — Z992 Dependence on renal dialysis: Secondary | ICD-10-CM | POA: Diagnosis not present

## 2011-01-27 DIAGNOSIS — N2581 Secondary hyperparathyroidism of renal origin: Secondary | ICD-10-CM | POA: Diagnosis not present

## 2011-01-27 DIAGNOSIS — Z992 Dependence on renal dialysis: Secondary | ICD-10-CM | POA: Diagnosis not present

## 2011-01-27 DIAGNOSIS — D509 Iron deficiency anemia, unspecified: Secondary | ICD-10-CM | POA: Diagnosis not present

## 2011-01-27 DIAGNOSIS — D631 Anemia in chronic kidney disease: Secondary | ICD-10-CM | POA: Diagnosis not present

## 2011-01-27 DIAGNOSIS — N186 End stage renal disease: Secondary | ICD-10-CM | POA: Diagnosis not present

## 2011-01-29 DIAGNOSIS — Z992 Dependence on renal dialysis: Secondary | ICD-10-CM | POA: Diagnosis not present

## 2011-01-29 DIAGNOSIS — D631 Anemia in chronic kidney disease: Secondary | ICD-10-CM | POA: Diagnosis not present

## 2011-01-29 DIAGNOSIS — D509 Iron deficiency anemia, unspecified: Secondary | ICD-10-CM | POA: Diagnosis not present

## 2011-01-29 DIAGNOSIS — N186 End stage renal disease: Secondary | ICD-10-CM | POA: Diagnosis not present

## 2011-01-29 DIAGNOSIS — N2581 Secondary hyperparathyroidism of renal origin: Secondary | ICD-10-CM | POA: Diagnosis not present

## 2011-02-01 DIAGNOSIS — N039 Chronic nephritic syndrome with unspecified morphologic changes: Secondary | ICD-10-CM | POA: Diagnosis not present

## 2011-02-01 DIAGNOSIS — Z992 Dependence on renal dialysis: Secondary | ICD-10-CM | POA: Diagnosis not present

## 2011-02-01 DIAGNOSIS — N186 End stage renal disease: Secondary | ICD-10-CM | POA: Diagnosis not present

## 2011-02-01 DIAGNOSIS — N2581 Secondary hyperparathyroidism of renal origin: Secondary | ICD-10-CM | POA: Diagnosis not present

## 2011-02-01 DIAGNOSIS — D509 Iron deficiency anemia, unspecified: Secondary | ICD-10-CM | POA: Diagnosis not present

## 2011-02-02 DIAGNOSIS — Z01818 Encounter for other preprocedural examination: Secondary | ICD-10-CM | POA: Diagnosis not present

## 2011-02-02 DIAGNOSIS — I10 Essential (primary) hypertension: Secondary | ICD-10-CM | POA: Diagnosis not present

## 2011-02-02 DIAGNOSIS — Z79899 Other long term (current) drug therapy: Secondary | ICD-10-CM | POA: Diagnosis not present

## 2011-02-02 DIAGNOSIS — Z01810 Encounter for preprocedural cardiovascular examination: Secondary | ICD-10-CM | POA: Diagnosis not present

## 2011-02-02 DIAGNOSIS — N189 Chronic kidney disease, unspecified: Secondary | ICD-10-CM | POA: Diagnosis not present

## 2011-02-02 DIAGNOSIS — Z01811 Encounter for preprocedural respiratory examination: Secondary | ICD-10-CM | POA: Diagnosis not present

## 2011-02-02 DIAGNOSIS — I369 Nonrheumatic tricuspid valve disorder, unspecified: Secondary | ICD-10-CM | POA: Diagnosis not present

## 2011-02-02 DIAGNOSIS — I770 Arteriovenous fistula, acquired: Secondary | ICD-10-CM | POA: Diagnosis not present

## 2011-02-02 DIAGNOSIS — T8610 Unspecified complication of kidney transplant: Secondary | ICD-10-CM | POA: Diagnosis not present

## 2011-02-03 DIAGNOSIS — N186 End stage renal disease: Secondary | ICD-10-CM | POA: Diagnosis not present

## 2011-02-03 DIAGNOSIS — N2581 Secondary hyperparathyroidism of renal origin: Secondary | ICD-10-CM | POA: Diagnosis not present

## 2011-02-03 DIAGNOSIS — D509 Iron deficiency anemia, unspecified: Secondary | ICD-10-CM | POA: Diagnosis not present

## 2011-02-03 DIAGNOSIS — Z992 Dependence on renal dialysis: Secondary | ICD-10-CM | POA: Diagnosis not present

## 2011-02-03 DIAGNOSIS — D631 Anemia in chronic kidney disease: Secondary | ICD-10-CM | POA: Diagnosis not present

## 2011-02-05 DIAGNOSIS — N2581 Secondary hyperparathyroidism of renal origin: Secondary | ICD-10-CM | POA: Diagnosis not present

## 2011-02-05 DIAGNOSIS — D509 Iron deficiency anemia, unspecified: Secondary | ICD-10-CM | POA: Diagnosis not present

## 2011-02-05 DIAGNOSIS — D631 Anemia in chronic kidney disease: Secondary | ICD-10-CM | POA: Diagnosis not present

## 2011-02-05 DIAGNOSIS — Z992 Dependence on renal dialysis: Secondary | ICD-10-CM | POA: Diagnosis not present

## 2011-02-05 DIAGNOSIS — N186 End stage renal disease: Secondary | ICD-10-CM | POA: Diagnosis not present

## 2011-02-08 DIAGNOSIS — D509 Iron deficiency anemia, unspecified: Secondary | ICD-10-CM | POA: Diagnosis not present

## 2011-02-08 DIAGNOSIS — N039 Chronic nephritic syndrome with unspecified morphologic changes: Secondary | ICD-10-CM | POA: Diagnosis not present

## 2011-02-08 DIAGNOSIS — Z992 Dependence on renal dialysis: Secondary | ICD-10-CM | POA: Diagnosis not present

## 2011-02-08 DIAGNOSIS — N2581 Secondary hyperparathyroidism of renal origin: Secondary | ICD-10-CM | POA: Diagnosis not present

## 2011-02-08 DIAGNOSIS — N186 End stage renal disease: Secondary | ICD-10-CM | POA: Diagnosis not present

## 2011-02-09 DIAGNOSIS — Z940 Kidney transplant status: Secondary | ICD-10-CM | POA: Diagnosis not present

## 2011-02-09 DIAGNOSIS — Z01818 Encounter for other preprocedural examination: Secondary | ICD-10-CM | POA: Diagnosis not present

## 2011-02-09 DIAGNOSIS — I10 Essential (primary) hypertension: Secondary | ICD-10-CM | POA: Diagnosis not present

## 2011-02-10 DIAGNOSIS — N2581 Secondary hyperparathyroidism of renal origin: Secondary | ICD-10-CM | POA: Diagnosis not present

## 2011-02-10 DIAGNOSIS — D631 Anemia in chronic kidney disease: Secondary | ICD-10-CM | POA: Diagnosis not present

## 2011-02-10 DIAGNOSIS — Z992 Dependence on renal dialysis: Secondary | ICD-10-CM | POA: Diagnosis not present

## 2011-02-10 DIAGNOSIS — D509 Iron deficiency anemia, unspecified: Secondary | ICD-10-CM | POA: Diagnosis not present

## 2011-02-10 DIAGNOSIS — N186 End stage renal disease: Secondary | ICD-10-CM | POA: Diagnosis not present

## 2011-02-12 DIAGNOSIS — N186 End stage renal disease: Secondary | ICD-10-CM | POA: Diagnosis not present

## 2011-02-12 DIAGNOSIS — N2581 Secondary hyperparathyroidism of renal origin: Secondary | ICD-10-CM | POA: Diagnosis not present

## 2011-02-12 DIAGNOSIS — Z992 Dependence on renal dialysis: Secondary | ICD-10-CM | POA: Diagnosis not present

## 2011-02-12 DIAGNOSIS — D509 Iron deficiency anemia, unspecified: Secondary | ICD-10-CM | POA: Diagnosis not present

## 2011-02-12 DIAGNOSIS — D631 Anemia in chronic kidney disease: Secondary | ICD-10-CM | POA: Diagnosis not present

## 2011-02-15 DIAGNOSIS — Z992 Dependence on renal dialysis: Secondary | ICD-10-CM | POA: Diagnosis not present

## 2011-02-15 DIAGNOSIS — D509 Iron deficiency anemia, unspecified: Secondary | ICD-10-CM | POA: Diagnosis not present

## 2011-02-15 DIAGNOSIS — N2581 Secondary hyperparathyroidism of renal origin: Secondary | ICD-10-CM | POA: Diagnosis not present

## 2011-02-15 DIAGNOSIS — D631 Anemia in chronic kidney disease: Secondary | ICD-10-CM | POA: Diagnosis not present

## 2011-02-15 DIAGNOSIS — N186 End stage renal disease: Secondary | ICD-10-CM | POA: Diagnosis not present

## 2011-02-17 DIAGNOSIS — N186 End stage renal disease: Secondary | ICD-10-CM | POA: Diagnosis not present

## 2011-02-17 DIAGNOSIS — Z992 Dependence on renal dialysis: Secondary | ICD-10-CM | POA: Diagnosis not present

## 2011-02-17 DIAGNOSIS — D631 Anemia in chronic kidney disease: Secondary | ICD-10-CM | POA: Diagnosis not present

## 2011-02-17 DIAGNOSIS — N2581 Secondary hyperparathyroidism of renal origin: Secondary | ICD-10-CM | POA: Diagnosis not present

## 2011-02-17 DIAGNOSIS — D509 Iron deficiency anemia, unspecified: Secondary | ICD-10-CM | POA: Diagnosis not present

## 2011-02-19 DIAGNOSIS — N039 Chronic nephritic syndrome with unspecified morphologic changes: Secondary | ICD-10-CM | POA: Diagnosis not present

## 2011-02-19 DIAGNOSIS — N2581 Secondary hyperparathyroidism of renal origin: Secondary | ICD-10-CM | POA: Diagnosis not present

## 2011-02-19 DIAGNOSIS — D509 Iron deficiency anemia, unspecified: Secondary | ICD-10-CM | POA: Diagnosis not present

## 2011-02-19 DIAGNOSIS — D631 Anemia in chronic kidney disease: Secondary | ICD-10-CM | POA: Diagnosis not present

## 2011-02-19 DIAGNOSIS — N186 End stage renal disease: Secondary | ICD-10-CM | POA: Diagnosis not present

## 2011-02-22 DIAGNOSIS — N2581 Secondary hyperparathyroidism of renal origin: Secondary | ICD-10-CM | POA: Diagnosis not present

## 2011-02-22 DIAGNOSIS — D631 Anemia in chronic kidney disease: Secondary | ICD-10-CM | POA: Diagnosis not present

## 2011-02-22 DIAGNOSIS — D509 Iron deficiency anemia, unspecified: Secondary | ICD-10-CM | POA: Diagnosis not present

## 2011-02-22 DIAGNOSIS — N186 End stage renal disease: Secondary | ICD-10-CM | POA: Diagnosis not present

## 2011-02-24 DIAGNOSIS — N186 End stage renal disease: Secondary | ICD-10-CM | POA: Diagnosis not present

## 2011-02-24 DIAGNOSIS — N2581 Secondary hyperparathyroidism of renal origin: Secondary | ICD-10-CM | POA: Diagnosis not present

## 2011-02-24 DIAGNOSIS — D509 Iron deficiency anemia, unspecified: Secondary | ICD-10-CM | POA: Diagnosis not present

## 2011-02-24 DIAGNOSIS — D631 Anemia in chronic kidney disease: Secondary | ICD-10-CM | POA: Diagnosis not present

## 2011-02-26 DIAGNOSIS — N039 Chronic nephritic syndrome with unspecified morphologic changes: Secondary | ICD-10-CM | POA: Diagnosis not present

## 2011-02-26 DIAGNOSIS — N2581 Secondary hyperparathyroidism of renal origin: Secondary | ICD-10-CM | POA: Diagnosis not present

## 2011-02-26 DIAGNOSIS — N186 End stage renal disease: Secondary | ICD-10-CM | POA: Diagnosis not present

## 2011-02-26 DIAGNOSIS — D509 Iron deficiency anemia, unspecified: Secondary | ICD-10-CM | POA: Diagnosis not present

## 2011-03-01 DIAGNOSIS — N186 End stage renal disease: Secondary | ICD-10-CM | POA: Diagnosis not present

## 2011-03-01 DIAGNOSIS — D509 Iron deficiency anemia, unspecified: Secondary | ICD-10-CM | POA: Diagnosis not present

## 2011-03-01 DIAGNOSIS — N2581 Secondary hyperparathyroidism of renal origin: Secondary | ICD-10-CM | POA: Diagnosis not present

## 2011-03-01 DIAGNOSIS — D631 Anemia in chronic kidney disease: Secondary | ICD-10-CM | POA: Diagnosis not present

## 2011-03-03 DIAGNOSIS — N186 End stage renal disease: Secondary | ICD-10-CM | POA: Diagnosis not present

## 2011-03-03 DIAGNOSIS — N2581 Secondary hyperparathyroidism of renal origin: Secondary | ICD-10-CM | POA: Diagnosis not present

## 2011-03-03 DIAGNOSIS — N039 Chronic nephritic syndrome with unspecified morphologic changes: Secondary | ICD-10-CM | POA: Diagnosis not present

## 2011-03-03 DIAGNOSIS — D509 Iron deficiency anemia, unspecified: Secondary | ICD-10-CM | POA: Diagnosis not present

## 2011-03-05 DIAGNOSIS — N186 End stage renal disease: Secondary | ICD-10-CM | POA: Diagnosis not present

## 2011-03-05 DIAGNOSIS — N039 Chronic nephritic syndrome with unspecified morphologic changes: Secondary | ICD-10-CM | POA: Diagnosis not present

## 2011-03-05 DIAGNOSIS — D509 Iron deficiency anemia, unspecified: Secondary | ICD-10-CM | POA: Diagnosis not present

## 2011-03-05 DIAGNOSIS — N2581 Secondary hyperparathyroidism of renal origin: Secondary | ICD-10-CM | POA: Diagnosis not present

## 2011-03-08 DIAGNOSIS — N186 End stage renal disease: Secondary | ICD-10-CM | POA: Diagnosis not present

## 2011-03-08 DIAGNOSIS — N2581 Secondary hyperparathyroidism of renal origin: Secondary | ICD-10-CM | POA: Diagnosis not present

## 2011-03-08 DIAGNOSIS — D509 Iron deficiency anemia, unspecified: Secondary | ICD-10-CM | POA: Diagnosis not present

## 2011-03-08 DIAGNOSIS — D631 Anemia in chronic kidney disease: Secondary | ICD-10-CM | POA: Diagnosis not present

## 2011-03-08 DIAGNOSIS — N039 Chronic nephritic syndrome with unspecified morphologic changes: Secondary | ICD-10-CM | POA: Diagnosis not present

## 2011-03-10 DIAGNOSIS — N2581 Secondary hyperparathyroidism of renal origin: Secondary | ICD-10-CM | POA: Diagnosis not present

## 2011-03-10 DIAGNOSIS — D631 Anemia in chronic kidney disease: Secondary | ICD-10-CM | POA: Diagnosis not present

## 2011-03-10 DIAGNOSIS — N186 End stage renal disease: Secondary | ICD-10-CM | POA: Diagnosis not present

## 2011-03-10 DIAGNOSIS — D509 Iron deficiency anemia, unspecified: Secondary | ICD-10-CM | POA: Diagnosis not present

## 2011-03-12 DIAGNOSIS — N039 Chronic nephritic syndrome with unspecified morphologic changes: Secondary | ICD-10-CM | POA: Diagnosis not present

## 2011-03-12 DIAGNOSIS — N186 End stage renal disease: Secondary | ICD-10-CM | POA: Diagnosis not present

## 2011-03-12 DIAGNOSIS — N2581 Secondary hyperparathyroidism of renal origin: Secondary | ICD-10-CM | POA: Diagnosis not present

## 2011-03-12 DIAGNOSIS — D509 Iron deficiency anemia, unspecified: Secondary | ICD-10-CM | POA: Diagnosis not present

## 2011-03-15 DIAGNOSIS — N186 End stage renal disease: Secondary | ICD-10-CM | POA: Diagnosis not present

## 2011-03-15 DIAGNOSIS — D509 Iron deficiency anemia, unspecified: Secondary | ICD-10-CM | POA: Diagnosis not present

## 2011-03-15 DIAGNOSIS — N039 Chronic nephritic syndrome with unspecified morphologic changes: Secondary | ICD-10-CM | POA: Diagnosis not present

## 2011-03-15 DIAGNOSIS — N2581 Secondary hyperparathyroidism of renal origin: Secondary | ICD-10-CM | POA: Diagnosis not present

## 2011-03-17 DIAGNOSIS — D631 Anemia in chronic kidney disease: Secondary | ICD-10-CM | POA: Diagnosis not present

## 2011-03-17 DIAGNOSIS — D509 Iron deficiency anemia, unspecified: Secondary | ICD-10-CM | POA: Diagnosis not present

## 2011-03-17 DIAGNOSIS — N186 End stage renal disease: Secondary | ICD-10-CM | POA: Diagnosis not present

## 2011-03-17 DIAGNOSIS — N2581 Secondary hyperparathyroidism of renal origin: Secondary | ICD-10-CM | POA: Diagnosis not present

## 2011-03-19 DIAGNOSIS — N039 Chronic nephritic syndrome with unspecified morphologic changes: Secondary | ICD-10-CM | POA: Diagnosis not present

## 2011-03-19 DIAGNOSIS — D509 Iron deficiency anemia, unspecified: Secondary | ICD-10-CM | POA: Diagnosis not present

## 2011-03-19 DIAGNOSIS — N186 End stage renal disease: Secondary | ICD-10-CM | POA: Diagnosis not present

## 2011-03-19 DIAGNOSIS — N2581 Secondary hyperparathyroidism of renal origin: Secondary | ICD-10-CM | POA: Diagnosis not present

## 2011-03-22 DIAGNOSIS — D509 Iron deficiency anemia, unspecified: Secondary | ICD-10-CM | POA: Diagnosis not present

## 2011-03-22 DIAGNOSIS — N2581 Secondary hyperparathyroidism of renal origin: Secondary | ICD-10-CM | POA: Diagnosis not present

## 2011-03-22 DIAGNOSIS — N039 Chronic nephritic syndrome with unspecified morphologic changes: Secondary | ICD-10-CM | POA: Diagnosis not present

## 2011-03-22 DIAGNOSIS — N186 End stage renal disease: Secondary | ICD-10-CM | POA: Diagnosis not present

## 2011-03-24 DIAGNOSIS — N2581 Secondary hyperparathyroidism of renal origin: Secondary | ICD-10-CM | POA: Diagnosis not present

## 2011-03-24 DIAGNOSIS — N039 Chronic nephritic syndrome with unspecified morphologic changes: Secondary | ICD-10-CM | POA: Diagnosis not present

## 2011-03-24 DIAGNOSIS — D631 Anemia in chronic kidney disease: Secondary | ICD-10-CM | POA: Diagnosis not present

## 2011-03-24 DIAGNOSIS — D509 Iron deficiency anemia, unspecified: Secondary | ICD-10-CM | POA: Diagnosis not present

## 2011-03-24 DIAGNOSIS — N186 End stage renal disease: Secondary | ICD-10-CM | POA: Diagnosis not present

## 2011-03-26 DIAGNOSIS — N186 End stage renal disease: Secondary | ICD-10-CM | POA: Diagnosis not present

## 2011-03-26 DIAGNOSIS — D631 Anemia in chronic kidney disease: Secondary | ICD-10-CM | POA: Diagnosis not present

## 2011-03-26 DIAGNOSIS — D509 Iron deficiency anemia, unspecified: Secondary | ICD-10-CM | POA: Diagnosis not present

## 2011-03-26 DIAGNOSIS — N2581 Secondary hyperparathyroidism of renal origin: Secondary | ICD-10-CM | POA: Diagnosis not present

## 2011-03-29 DIAGNOSIS — N2581 Secondary hyperparathyroidism of renal origin: Secondary | ICD-10-CM | POA: Diagnosis not present

## 2011-03-29 DIAGNOSIS — D631 Anemia in chronic kidney disease: Secondary | ICD-10-CM | POA: Diagnosis not present

## 2011-03-29 DIAGNOSIS — D509 Iron deficiency anemia, unspecified: Secondary | ICD-10-CM | POA: Diagnosis not present

## 2011-03-29 DIAGNOSIS — N186 End stage renal disease: Secondary | ICD-10-CM | POA: Diagnosis not present

## 2011-03-31 DIAGNOSIS — N2581 Secondary hyperparathyroidism of renal origin: Secondary | ICD-10-CM | POA: Diagnosis not present

## 2011-03-31 DIAGNOSIS — N186 End stage renal disease: Secondary | ICD-10-CM | POA: Diagnosis not present

## 2011-03-31 DIAGNOSIS — N039 Chronic nephritic syndrome with unspecified morphologic changes: Secondary | ICD-10-CM | POA: Diagnosis not present

## 2011-03-31 DIAGNOSIS — D509 Iron deficiency anemia, unspecified: Secondary | ICD-10-CM | POA: Diagnosis not present

## 2011-04-02 DIAGNOSIS — D631 Anemia in chronic kidney disease: Secondary | ICD-10-CM | POA: Diagnosis not present

## 2011-04-02 DIAGNOSIS — D509 Iron deficiency anemia, unspecified: Secondary | ICD-10-CM | POA: Diagnosis not present

## 2011-04-02 DIAGNOSIS — N2581 Secondary hyperparathyroidism of renal origin: Secondary | ICD-10-CM | POA: Diagnosis not present

## 2011-04-02 DIAGNOSIS — N186 End stage renal disease: Secondary | ICD-10-CM | POA: Diagnosis not present

## 2011-04-05 DIAGNOSIS — D631 Anemia in chronic kidney disease: Secondary | ICD-10-CM | POA: Diagnosis not present

## 2011-04-05 DIAGNOSIS — N2581 Secondary hyperparathyroidism of renal origin: Secondary | ICD-10-CM | POA: Diagnosis not present

## 2011-04-05 DIAGNOSIS — N186 End stage renal disease: Secondary | ICD-10-CM | POA: Diagnosis not present

## 2011-04-05 DIAGNOSIS — D509 Iron deficiency anemia, unspecified: Secondary | ICD-10-CM | POA: Diagnosis not present

## 2011-04-07 DIAGNOSIS — N2581 Secondary hyperparathyroidism of renal origin: Secondary | ICD-10-CM | POA: Diagnosis not present

## 2011-04-07 DIAGNOSIS — D509 Iron deficiency anemia, unspecified: Secondary | ICD-10-CM | POA: Diagnosis not present

## 2011-04-07 DIAGNOSIS — D631 Anemia in chronic kidney disease: Secondary | ICD-10-CM | POA: Diagnosis not present

## 2011-04-07 DIAGNOSIS — N186 End stage renal disease: Secondary | ICD-10-CM | POA: Diagnosis not present

## 2011-04-09 DIAGNOSIS — D631 Anemia in chronic kidney disease: Secondary | ICD-10-CM | POA: Diagnosis not present

## 2011-04-09 DIAGNOSIS — N2581 Secondary hyperparathyroidism of renal origin: Secondary | ICD-10-CM | POA: Diagnosis not present

## 2011-04-09 DIAGNOSIS — D509 Iron deficiency anemia, unspecified: Secondary | ICD-10-CM | POA: Diagnosis not present

## 2011-04-09 DIAGNOSIS — N186 End stage renal disease: Secondary | ICD-10-CM | POA: Diagnosis not present

## 2011-04-12 DIAGNOSIS — D631 Anemia in chronic kidney disease: Secondary | ICD-10-CM | POA: Diagnosis not present

## 2011-04-12 DIAGNOSIS — D509 Iron deficiency anemia, unspecified: Secondary | ICD-10-CM | POA: Diagnosis not present

## 2011-04-12 DIAGNOSIS — N186 End stage renal disease: Secondary | ICD-10-CM | POA: Diagnosis not present

## 2011-04-12 DIAGNOSIS — N2581 Secondary hyperparathyroidism of renal origin: Secondary | ICD-10-CM | POA: Diagnosis not present

## 2011-04-14 DIAGNOSIS — D631 Anemia in chronic kidney disease: Secondary | ICD-10-CM | POA: Diagnosis not present

## 2011-04-14 DIAGNOSIS — D509 Iron deficiency anemia, unspecified: Secondary | ICD-10-CM | POA: Diagnosis not present

## 2011-04-14 DIAGNOSIS — N2581 Secondary hyperparathyroidism of renal origin: Secondary | ICD-10-CM | POA: Diagnosis not present

## 2011-04-14 DIAGNOSIS — N186 End stage renal disease: Secondary | ICD-10-CM | POA: Diagnosis not present

## 2011-04-16 DIAGNOSIS — N186 End stage renal disease: Secondary | ICD-10-CM | POA: Diagnosis not present

## 2011-04-16 DIAGNOSIS — N2581 Secondary hyperparathyroidism of renal origin: Secondary | ICD-10-CM | POA: Diagnosis not present

## 2011-04-16 DIAGNOSIS — D509 Iron deficiency anemia, unspecified: Secondary | ICD-10-CM | POA: Diagnosis not present

## 2011-04-16 DIAGNOSIS — D631 Anemia in chronic kidney disease: Secondary | ICD-10-CM | POA: Diagnosis not present

## 2011-04-17 DIAGNOSIS — N186 End stage renal disease: Secondary | ICD-10-CM | POA: Diagnosis not present

## 2011-04-19 DIAGNOSIS — N2581 Secondary hyperparathyroidism of renal origin: Secondary | ICD-10-CM | POA: Diagnosis not present

## 2011-04-19 DIAGNOSIS — N186 End stage renal disease: Secondary | ICD-10-CM | POA: Diagnosis not present

## 2011-04-19 DIAGNOSIS — D509 Iron deficiency anemia, unspecified: Secondary | ICD-10-CM | POA: Diagnosis not present

## 2011-05-17 DIAGNOSIS — N186 End stage renal disease: Secondary | ICD-10-CM | POA: Diagnosis not present

## 2011-05-19 DIAGNOSIS — N186 End stage renal disease: Secondary | ICD-10-CM | POA: Diagnosis not present

## 2011-05-19 DIAGNOSIS — D509 Iron deficiency anemia, unspecified: Secondary | ICD-10-CM | POA: Diagnosis not present

## 2011-05-19 DIAGNOSIS — N2581 Secondary hyperparathyroidism of renal origin: Secondary | ICD-10-CM | POA: Diagnosis not present

## 2011-05-21 DIAGNOSIS — N2581 Secondary hyperparathyroidism of renal origin: Secondary | ICD-10-CM | POA: Diagnosis not present

## 2011-05-21 DIAGNOSIS — N186 End stage renal disease: Secondary | ICD-10-CM | POA: Diagnosis not present

## 2011-05-21 DIAGNOSIS — D509 Iron deficiency anemia, unspecified: Secondary | ICD-10-CM | POA: Diagnosis not present

## 2011-05-24 DIAGNOSIS — N2581 Secondary hyperparathyroidism of renal origin: Secondary | ICD-10-CM | POA: Diagnosis not present

## 2011-05-24 DIAGNOSIS — N186 End stage renal disease: Secondary | ICD-10-CM | POA: Diagnosis not present

## 2011-05-24 DIAGNOSIS — D509 Iron deficiency anemia, unspecified: Secondary | ICD-10-CM | POA: Diagnosis not present

## 2011-05-26 DIAGNOSIS — N186 End stage renal disease: Secondary | ICD-10-CM | POA: Diagnosis not present

## 2011-05-26 DIAGNOSIS — N2581 Secondary hyperparathyroidism of renal origin: Secondary | ICD-10-CM | POA: Diagnosis not present

## 2011-05-26 DIAGNOSIS — D509 Iron deficiency anemia, unspecified: Secondary | ICD-10-CM | POA: Diagnosis not present

## 2011-05-28 DIAGNOSIS — D509 Iron deficiency anemia, unspecified: Secondary | ICD-10-CM | POA: Diagnosis not present

## 2011-05-28 DIAGNOSIS — N186 End stage renal disease: Secondary | ICD-10-CM | POA: Diagnosis not present

## 2011-05-28 DIAGNOSIS — N2581 Secondary hyperparathyroidism of renal origin: Secondary | ICD-10-CM | POA: Diagnosis not present

## 2011-05-31 DIAGNOSIS — N186 End stage renal disease: Secondary | ICD-10-CM | POA: Diagnosis not present

## 2011-05-31 DIAGNOSIS — N2581 Secondary hyperparathyroidism of renal origin: Secondary | ICD-10-CM | POA: Diagnosis not present

## 2011-05-31 DIAGNOSIS — D509 Iron deficiency anemia, unspecified: Secondary | ICD-10-CM | POA: Diagnosis not present

## 2011-06-02 DIAGNOSIS — D509 Iron deficiency anemia, unspecified: Secondary | ICD-10-CM | POA: Diagnosis not present

## 2011-06-02 DIAGNOSIS — N2581 Secondary hyperparathyroidism of renal origin: Secondary | ICD-10-CM | POA: Diagnosis not present

## 2011-06-02 DIAGNOSIS — N186 End stage renal disease: Secondary | ICD-10-CM | POA: Diagnosis not present

## 2011-06-04 DIAGNOSIS — N186 End stage renal disease: Secondary | ICD-10-CM | POA: Diagnosis not present

## 2011-06-04 DIAGNOSIS — N2581 Secondary hyperparathyroidism of renal origin: Secondary | ICD-10-CM | POA: Diagnosis not present

## 2011-06-04 DIAGNOSIS — D509 Iron deficiency anemia, unspecified: Secondary | ICD-10-CM | POA: Diagnosis not present

## 2011-06-07 DIAGNOSIS — D509 Iron deficiency anemia, unspecified: Secondary | ICD-10-CM | POA: Diagnosis not present

## 2011-06-07 DIAGNOSIS — N2581 Secondary hyperparathyroidism of renal origin: Secondary | ICD-10-CM | POA: Diagnosis not present

## 2011-06-07 DIAGNOSIS — N186 End stage renal disease: Secondary | ICD-10-CM | POA: Diagnosis not present

## 2011-06-09 DIAGNOSIS — N186 End stage renal disease: Secondary | ICD-10-CM | POA: Diagnosis not present

## 2011-06-09 DIAGNOSIS — N2581 Secondary hyperparathyroidism of renal origin: Secondary | ICD-10-CM | POA: Diagnosis not present

## 2011-06-09 DIAGNOSIS — D509 Iron deficiency anemia, unspecified: Secondary | ICD-10-CM | POA: Diagnosis not present

## 2011-06-11 DIAGNOSIS — N186 End stage renal disease: Secondary | ICD-10-CM | POA: Diagnosis not present

## 2011-06-11 DIAGNOSIS — N2581 Secondary hyperparathyroidism of renal origin: Secondary | ICD-10-CM | POA: Diagnosis not present

## 2011-06-11 DIAGNOSIS — D509 Iron deficiency anemia, unspecified: Secondary | ICD-10-CM | POA: Diagnosis not present

## 2011-06-14 DIAGNOSIS — N2581 Secondary hyperparathyroidism of renal origin: Secondary | ICD-10-CM | POA: Diagnosis not present

## 2011-06-14 DIAGNOSIS — N186 End stage renal disease: Secondary | ICD-10-CM | POA: Diagnosis not present

## 2011-06-14 DIAGNOSIS — D509 Iron deficiency anemia, unspecified: Secondary | ICD-10-CM | POA: Diagnosis not present

## 2011-06-16 DIAGNOSIS — N186 End stage renal disease: Secondary | ICD-10-CM | POA: Diagnosis not present

## 2011-06-16 DIAGNOSIS — D509 Iron deficiency anemia, unspecified: Secondary | ICD-10-CM | POA: Diagnosis not present

## 2011-06-16 DIAGNOSIS — N2581 Secondary hyperparathyroidism of renal origin: Secondary | ICD-10-CM | POA: Diagnosis not present

## 2011-06-17 DIAGNOSIS — N186 End stage renal disease: Secondary | ICD-10-CM | POA: Diagnosis not present

## 2011-06-18 DIAGNOSIS — N2581 Secondary hyperparathyroidism of renal origin: Secondary | ICD-10-CM | POA: Diagnosis not present

## 2011-06-18 DIAGNOSIS — D631 Anemia in chronic kidney disease: Secondary | ICD-10-CM | POA: Diagnosis not present

## 2011-06-18 DIAGNOSIS — D509 Iron deficiency anemia, unspecified: Secondary | ICD-10-CM | POA: Diagnosis not present

## 2011-06-18 DIAGNOSIS — N186 End stage renal disease: Secondary | ICD-10-CM | POA: Diagnosis not present

## 2011-06-18 DIAGNOSIS — N039 Chronic nephritic syndrome with unspecified morphologic changes: Secondary | ICD-10-CM | POA: Diagnosis not present

## 2011-06-21 DIAGNOSIS — N039 Chronic nephritic syndrome with unspecified morphologic changes: Secondary | ICD-10-CM | POA: Diagnosis not present

## 2011-06-21 DIAGNOSIS — N186 End stage renal disease: Secondary | ICD-10-CM | POA: Diagnosis not present

## 2011-06-21 DIAGNOSIS — N2581 Secondary hyperparathyroidism of renal origin: Secondary | ICD-10-CM | POA: Diagnosis not present

## 2011-06-21 DIAGNOSIS — D509 Iron deficiency anemia, unspecified: Secondary | ICD-10-CM | POA: Diagnosis not present

## 2011-06-23 DIAGNOSIS — N186 End stage renal disease: Secondary | ICD-10-CM | POA: Diagnosis not present

## 2011-06-23 DIAGNOSIS — D631 Anemia in chronic kidney disease: Secondary | ICD-10-CM | POA: Diagnosis not present

## 2011-06-23 DIAGNOSIS — N039 Chronic nephritic syndrome with unspecified morphologic changes: Secondary | ICD-10-CM | POA: Diagnosis not present

## 2011-06-23 DIAGNOSIS — N2581 Secondary hyperparathyroidism of renal origin: Secondary | ICD-10-CM | POA: Diagnosis not present

## 2011-06-23 DIAGNOSIS — D509 Iron deficiency anemia, unspecified: Secondary | ICD-10-CM | POA: Diagnosis not present

## 2011-06-25 DIAGNOSIS — D631 Anemia in chronic kidney disease: Secondary | ICD-10-CM | POA: Diagnosis not present

## 2011-06-25 DIAGNOSIS — D509 Iron deficiency anemia, unspecified: Secondary | ICD-10-CM | POA: Diagnosis not present

## 2011-06-25 DIAGNOSIS — N2581 Secondary hyperparathyroidism of renal origin: Secondary | ICD-10-CM | POA: Diagnosis not present

## 2011-06-25 DIAGNOSIS — N186 End stage renal disease: Secondary | ICD-10-CM | POA: Diagnosis not present

## 2011-06-28 DIAGNOSIS — D509 Iron deficiency anemia, unspecified: Secondary | ICD-10-CM | POA: Diagnosis not present

## 2011-06-28 DIAGNOSIS — D631 Anemia in chronic kidney disease: Secondary | ICD-10-CM | POA: Diagnosis not present

## 2011-06-28 DIAGNOSIS — N186 End stage renal disease: Secondary | ICD-10-CM | POA: Diagnosis not present

## 2011-06-28 DIAGNOSIS — N2581 Secondary hyperparathyroidism of renal origin: Secondary | ICD-10-CM | POA: Diagnosis not present

## 2011-06-30 DIAGNOSIS — N2581 Secondary hyperparathyroidism of renal origin: Secondary | ICD-10-CM | POA: Diagnosis not present

## 2011-06-30 DIAGNOSIS — D509 Iron deficiency anemia, unspecified: Secondary | ICD-10-CM | POA: Diagnosis not present

## 2011-06-30 DIAGNOSIS — N186 End stage renal disease: Secondary | ICD-10-CM | POA: Diagnosis not present

## 2011-06-30 DIAGNOSIS — D631 Anemia in chronic kidney disease: Secondary | ICD-10-CM | POA: Diagnosis not present

## 2011-07-02 DIAGNOSIS — N186 End stage renal disease: Secondary | ICD-10-CM | POA: Diagnosis not present

## 2011-07-02 DIAGNOSIS — D509 Iron deficiency anemia, unspecified: Secondary | ICD-10-CM | POA: Diagnosis not present

## 2011-07-02 DIAGNOSIS — N2581 Secondary hyperparathyroidism of renal origin: Secondary | ICD-10-CM | POA: Diagnosis not present

## 2011-07-02 DIAGNOSIS — D631 Anemia in chronic kidney disease: Secondary | ICD-10-CM | POA: Diagnosis not present

## 2011-07-02 DIAGNOSIS — N039 Chronic nephritic syndrome with unspecified morphologic changes: Secondary | ICD-10-CM | POA: Diagnosis not present

## 2011-07-05 DIAGNOSIS — N186 End stage renal disease: Secondary | ICD-10-CM | POA: Diagnosis not present

## 2011-07-05 DIAGNOSIS — N2581 Secondary hyperparathyroidism of renal origin: Secondary | ICD-10-CM | POA: Diagnosis not present

## 2011-07-05 DIAGNOSIS — D509 Iron deficiency anemia, unspecified: Secondary | ICD-10-CM | POA: Diagnosis not present

## 2011-07-05 DIAGNOSIS — N039 Chronic nephritic syndrome with unspecified morphologic changes: Secondary | ICD-10-CM | POA: Diagnosis not present

## 2011-07-07 DIAGNOSIS — N2581 Secondary hyperparathyroidism of renal origin: Secondary | ICD-10-CM | POA: Diagnosis not present

## 2011-07-07 DIAGNOSIS — N186 End stage renal disease: Secondary | ICD-10-CM | POA: Diagnosis not present

## 2011-07-07 DIAGNOSIS — D631 Anemia in chronic kidney disease: Secondary | ICD-10-CM | POA: Diagnosis not present

## 2011-07-07 DIAGNOSIS — D509 Iron deficiency anemia, unspecified: Secondary | ICD-10-CM | POA: Diagnosis not present

## 2011-07-09 DIAGNOSIS — N2581 Secondary hyperparathyroidism of renal origin: Secondary | ICD-10-CM | POA: Diagnosis not present

## 2011-07-09 DIAGNOSIS — D509 Iron deficiency anemia, unspecified: Secondary | ICD-10-CM | POA: Diagnosis not present

## 2011-07-09 DIAGNOSIS — N186 End stage renal disease: Secondary | ICD-10-CM | POA: Diagnosis not present

## 2011-07-09 DIAGNOSIS — N039 Chronic nephritic syndrome with unspecified morphologic changes: Secondary | ICD-10-CM | POA: Diagnosis not present

## 2011-07-12 DIAGNOSIS — N2581 Secondary hyperparathyroidism of renal origin: Secondary | ICD-10-CM | POA: Diagnosis not present

## 2011-07-12 DIAGNOSIS — N186 End stage renal disease: Secondary | ICD-10-CM | POA: Diagnosis not present

## 2011-07-12 DIAGNOSIS — D631 Anemia in chronic kidney disease: Secondary | ICD-10-CM | POA: Diagnosis not present

## 2011-07-12 DIAGNOSIS — N039 Chronic nephritic syndrome with unspecified morphologic changes: Secondary | ICD-10-CM | POA: Diagnosis not present

## 2011-07-12 DIAGNOSIS — D509 Iron deficiency anemia, unspecified: Secondary | ICD-10-CM | POA: Diagnosis not present

## 2011-07-14 DIAGNOSIS — D509 Iron deficiency anemia, unspecified: Secondary | ICD-10-CM | POA: Diagnosis not present

## 2011-07-14 DIAGNOSIS — N2581 Secondary hyperparathyroidism of renal origin: Secondary | ICD-10-CM | POA: Diagnosis not present

## 2011-07-14 DIAGNOSIS — N186 End stage renal disease: Secondary | ICD-10-CM | POA: Diagnosis not present

## 2011-07-14 DIAGNOSIS — N039 Chronic nephritic syndrome with unspecified morphologic changes: Secondary | ICD-10-CM | POA: Diagnosis not present

## 2011-07-16 DIAGNOSIS — N2581 Secondary hyperparathyroidism of renal origin: Secondary | ICD-10-CM | POA: Diagnosis not present

## 2011-07-16 DIAGNOSIS — D509 Iron deficiency anemia, unspecified: Secondary | ICD-10-CM | POA: Diagnosis not present

## 2011-07-16 DIAGNOSIS — D631 Anemia in chronic kidney disease: Secondary | ICD-10-CM | POA: Diagnosis not present

## 2011-07-16 DIAGNOSIS — N186 End stage renal disease: Secondary | ICD-10-CM | POA: Diagnosis not present

## 2011-07-17 DIAGNOSIS — N186 End stage renal disease: Secondary | ICD-10-CM | POA: Diagnosis not present

## 2011-07-19 DIAGNOSIS — N2581 Secondary hyperparathyroidism of renal origin: Secondary | ICD-10-CM | POA: Diagnosis not present

## 2011-07-19 DIAGNOSIS — D509 Iron deficiency anemia, unspecified: Secondary | ICD-10-CM | POA: Diagnosis not present

## 2011-07-19 DIAGNOSIS — D631 Anemia in chronic kidney disease: Secondary | ICD-10-CM | POA: Diagnosis not present

## 2011-07-19 DIAGNOSIS — N186 End stage renal disease: Secondary | ICD-10-CM | POA: Diagnosis not present

## 2011-08-17 DIAGNOSIS — N186 End stage renal disease: Secondary | ICD-10-CM | POA: Diagnosis not present

## 2011-08-18 DIAGNOSIS — D509 Iron deficiency anemia, unspecified: Secondary | ICD-10-CM | POA: Diagnosis not present

## 2011-08-18 DIAGNOSIS — N186 End stage renal disease: Secondary | ICD-10-CM | POA: Diagnosis not present

## 2011-08-18 DIAGNOSIS — D631 Anemia in chronic kidney disease: Secondary | ICD-10-CM | POA: Diagnosis not present

## 2011-08-18 DIAGNOSIS — N2581 Secondary hyperparathyroidism of renal origin: Secondary | ICD-10-CM | POA: Diagnosis not present

## 2011-09-17 DIAGNOSIS — N186 End stage renal disease: Secondary | ICD-10-CM | POA: Diagnosis not present

## 2011-09-20 DIAGNOSIS — D631 Anemia in chronic kidney disease: Secondary | ICD-10-CM | POA: Diagnosis not present

## 2011-09-20 DIAGNOSIS — Z23 Encounter for immunization: Secondary | ICD-10-CM | POA: Diagnosis not present

## 2011-09-20 DIAGNOSIS — N186 End stage renal disease: Secondary | ICD-10-CM | POA: Diagnosis not present

## 2011-09-20 DIAGNOSIS — N2581 Secondary hyperparathyroidism of renal origin: Secondary | ICD-10-CM | POA: Diagnosis not present

## 2011-09-20 DIAGNOSIS — D509 Iron deficiency anemia, unspecified: Secondary | ICD-10-CM | POA: Diagnosis not present

## 2011-10-17 DIAGNOSIS — N186 End stage renal disease: Secondary | ICD-10-CM | POA: Diagnosis not present

## 2011-10-18 DIAGNOSIS — D509 Iron deficiency anemia, unspecified: Secondary | ICD-10-CM | POA: Diagnosis not present

## 2011-10-18 DIAGNOSIS — N2581 Secondary hyperparathyroidism of renal origin: Secondary | ICD-10-CM | POA: Diagnosis not present

## 2011-10-18 DIAGNOSIS — N186 End stage renal disease: Secondary | ICD-10-CM | POA: Diagnosis not present

## 2011-10-18 DIAGNOSIS — D631 Anemia in chronic kidney disease: Secondary | ICD-10-CM | POA: Diagnosis not present

## 2011-10-20 DIAGNOSIS — N2581 Secondary hyperparathyroidism of renal origin: Secondary | ICD-10-CM | POA: Diagnosis not present

## 2011-10-20 DIAGNOSIS — N186 End stage renal disease: Secondary | ICD-10-CM | POA: Diagnosis not present

## 2011-10-20 DIAGNOSIS — D631 Anemia in chronic kidney disease: Secondary | ICD-10-CM | POA: Diagnosis not present

## 2011-10-20 DIAGNOSIS — D509 Iron deficiency anemia, unspecified: Secondary | ICD-10-CM | POA: Diagnosis not present

## 2011-10-22 DIAGNOSIS — N2581 Secondary hyperparathyroidism of renal origin: Secondary | ICD-10-CM | POA: Diagnosis not present

## 2011-10-22 DIAGNOSIS — D631 Anemia in chronic kidney disease: Secondary | ICD-10-CM | POA: Diagnosis not present

## 2011-10-22 DIAGNOSIS — N186 End stage renal disease: Secondary | ICD-10-CM | POA: Diagnosis not present

## 2011-10-22 DIAGNOSIS — D509 Iron deficiency anemia, unspecified: Secondary | ICD-10-CM | POA: Diagnosis not present

## 2011-10-25 DIAGNOSIS — N2581 Secondary hyperparathyroidism of renal origin: Secondary | ICD-10-CM | POA: Diagnosis not present

## 2011-10-25 DIAGNOSIS — N039 Chronic nephritic syndrome with unspecified morphologic changes: Secondary | ICD-10-CM | POA: Diagnosis not present

## 2011-10-25 DIAGNOSIS — D509 Iron deficiency anemia, unspecified: Secondary | ICD-10-CM | POA: Diagnosis not present

## 2011-10-25 DIAGNOSIS — N186 End stage renal disease: Secondary | ICD-10-CM | POA: Diagnosis not present

## 2011-10-27 DIAGNOSIS — N2581 Secondary hyperparathyroidism of renal origin: Secondary | ICD-10-CM | POA: Diagnosis not present

## 2011-10-27 DIAGNOSIS — D509 Iron deficiency anemia, unspecified: Secondary | ICD-10-CM | POA: Diagnosis not present

## 2011-10-27 DIAGNOSIS — N186 End stage renal disease: Secondary | ICD-10-CM | POA: Diagnosis not present

## 2011-10-27 DIAGNOSIS — D631 Anemia in chronic kidney disease: Secondary | ICD-10-CM | POA: Diagnosis not present

## 2011-10-29 DIAGNOSIS — D509 Iron deficiency anemia, unspecified: Secondary | ICD-10-CM | POA: Diagnosis not present

## 2011-10-29 DIAGNOSIS — N039 Chronic nephritic syndrome with unspecified morphologic changes: Secondary | ICD-10-CM | POA: Diagnosis not present

## 2011-10-29 DIAGNOSIS — N2581 Secondary hyperparathyroidism of renal origin: Secondary | ICD-10-CM | POA: Diagnosis not present

## 2011-10-29 DIAGNOSIS — N186 End stage renal disease: Secondary | ICD-10-CM | POA: Diagnosis not present

## 2011-11-01 DIAGNOSIS — N2581 Secondary hyperparathyroidism of renal origin: Secondary | ICD-10-CM | POA: Diagnosis not present

## 2011-11-01 DIAGNOSIS — N186 End stage renal disease: Secondary | ICD-10-CM | POA: Diagnosis not present

## 2011-11-01 DIAGNOSIS — D631 Anemia in chronic kidney disease: Secondary | ICD-10-CM | POA: Diagnosis not present

## 2011-11-01 DIAGNOSIS — D509 Iron deficiency anemia, unspecified: Secondary | ICD-10-CM | POA: Diagnosis not present

## 2011-11-03 DIAGNOSIS — N2581 Secondary hyperparathyroidism of renal origin: Secondary | ICD-10-CM | POA: Diagnosis not present

## 2011-11-03 DIAGNOSIS — N186 End stage renal disease: Secondary | ICD-10-CM | POA: Diagnosis not present

## 2011-11-03 DIAGNOSIS — D631 Anemia in chronic kidney disease: Secondary | ICD-10-CM | POA: Diagnosis not present

## 2011-11-03 DIAGNOSIS — D509 Iron deficiency anemia, unspecified: Secondary | ICD-10-CM | POA: Diagnosis not present

## 2011-11-05 DIAGNOSIS — N2581 Secondary hyperparathyroidism of renal origin: Secondary | ICD-10-CM | POA: Diagnosis not present

## 2011-11-05 DIAGNOSIS — D509 Iron deficiency anemia, unspecified: Secondary | ICD-10-CM | POA: Diagnosis not present

## 2011-11-05 DIAGNOSIS — D631 Anemia in chronic kidney disease: Secondary | ICD-10-CM | POA: Diagnosis not present

## 2011-11-05 DIAGNOSIS — N186 End stage renal disease: Secondary | ICD-10-CM | POA: Diagnosis not present

## 2011-11-08 DIAGNOSIS — N2581 Secondary hyperparathyroidism of renal origin: Secondary | ICD-10-CM | POA: Diagnosis not present

## 2011-11-08 DIAGNOSIS — D509 Iron deficiency anemia, unspecified: Secondary | ICD-10-CM | POA: Diagnosis not present

## 2011-11-08 DIAGNOSIS — D631 Anemia in chronic kidney disease: Secondary | ICD-10-CM | POA: Diagnosis not present

## 2011-11-08 DIAGNOSIS — N186 End stage renal disease: Secondary | ICD-10-CM | POA: Diagnosis not present

## 2011-11-10 DIAGNOSIS — N186 End stage renal disease: Secondary | ICD-10-CM | POA: Diagnosis not present

## 2011-11-10 DIAGNOSIS — D509 Iron deficiency anemia, unspecified: Secondary | ICD-10-CM | POA: Diagnosis not present

## 2011-11-10 DIAGNOSIS — D631 Anemia in chronic kidney disease: Secondary | ICD-10-CM | POA: Diagnosis not present

## 2011-11-10 DIAGNOSIS — N2581 Secondary hyperparathyroidism of renal origin: Secondary | ICD-10-CM | POA: Diagnosis not present

## 2011-11-12 DIAGNOSIS — N2581 Secondary hyperparathyroidism of renal origin: Secondary | ICD-10-CM | POA: Diagnosis not present

## 2011-11-12 DIAGNOSIS — D509 Iron deficiency anemia, unspecified: Secondary | ICD-10-CM | POA: Diagnosis not present

## 2011-11-12 DIAGNOSIS — D631 Anemia in chronic kidney disease: Secondary | ICD-10-CM | POA: Diagnosis not present

## 2011-11-12 DIAGNOSIS — N186 End stage renal disease: Secondary | ICD-10-CM | POA: Diagnosis not present

## 2011-11-15 DIAGNOSIS — D631 Anemia in chronic kidney disease: Secondary | ICD-10-CM | POA: Diagnosis not present

## 2011-11-15 DIAGNOSIS — D509 Iron deficiency anemia, unspecified: Secondary | ICD-10-CM | POA: Diagnosis not present

## 2011-11-15 DIAGNOSIS — N2581 Secondary hyperparathyroidism of renal origin: Secondary | ICD-10-CM | POA: Diagnosis not present

## 2011-11-15 DIAGNOSIS — N186 End stage renal disease: Secondary | ICD-10-CM | POA: Diagnosis not present

## 2011-11-17 DIAGNOSIS — N186 End stage renal disease: Secondary | ICD-10-CM | POA: Diagnosis not present

## 2011-11-17 DIAGNOSIS — D631 Anemia in chronic kidney disease: Secondary | ICD-10-CM | POA: Diagnosis not present

## 2011-11-17 DIAGNOSIS — N2581 Secondary hyperparathyroidism of renal origin: Secondary | ICD-10-CM | POA: Diagnosis not present

## 2011-11-17 DIAGNOSIS — D509 Iron deficiency anemia, unspecified: Secondary | ICD-10-CM | POA: Diagnosis not present

## 2011-11-19 DIAGNOSIS — N186 End stage renal disease: Secondary | ICD-10-CM | POA: Diagnosis not present

## 2011-11-19 DIAGNOSIS — N2581 Secondary hyperparathyroidism of renal origin: Secondary | ICD-10-CM | POA: Diagnosis not present

## 2011-11-19 DIAGNOSIS — D509 Iron deficiency anemia, unspecified: Secondary | ICD-10-CM | POA: Diagnosis not present

## 2011-11-22 DIAGNOSIS — D509 Iron deficiency anemia, unspecified: Secondary | ICD-10-CM | POA: Diagnosis not present

## 2011-11-22 DIAGNOSIS — N186 End stage renal disease: Secondary | ICD-10-CM | POA: Diagnosis not present

## 2011-11-22 DIAGNOSIS — N2581 Secondary hyperparathyroidism of renal origin: Secondary | ICD-10-CM | POA: Diagnosis not present

## 2011-11-24 DIAGNOSIS — N2581 Secondary hyperparathyroidism of renal origin: Secondary | ICD-10-CM | POA: Diagnosis not present

## 2011-11-24 DIAGNOSIS — D509 Iron deficiency anemia, unspecified: Secondary | ICD-10-CM | POA: Diagnosis not present

## 2011-11-24 DIAGNOSIS — N186 End stage renal disease: Secondary | ICD-10-CM | POA: Diagnosis not present

## 2011-11-26 DIAGNOSIS — N186 End stage renal disease: Secondary | ICD-10-CM | POA: Diagnosis not present

## 2011-11-26 DIAGNOSIS — D509 Iron deficiency anemia, unspecified: Secondary | ICD-10-CM | POA: Diagnosis not present

## 2011-11-26 DIAGNOSIS — N2581 Secondary hyperparathyroidism of renal origin: Secondary | ICD-10-CM | POA: Diagnosis not present

## 2011-11-29 DIAGNOSIS — N186 End stage renal disease: Secondary | ICD-10-CM | POA: Diagnosis not present

## 2011-11-29 DIAGNOSIS — N2581 Secondary hyperparathyroidism of renal origin: Secondary | ICD-10-CM | POA: Diagnosis not present

## 2011-11-29 DIAGNOSIS — D509 Iron deficiency anemia, unspecified: Secondary | ICD-10-CM | POA: Diagnosis not present

## 2011-12-01 DIAGNOSIS — N2581 Secondary hyperparathyroidism of renal origin: Secondary | ICD-10-CM | POA: Diagnosis not present

## 2011-12-01 DIAGNOSIS — D509 Iron deficiency anemia, unspecified: Secondary | ICD-10-CM | POA: Diagnosis not present

## 2011-12-01 DIAGNOSIS — N186 End stage renal disease: Secondary | ICD-10-CM | POA: Diagnosis not present

## 2011-12-03 DIAGNOSIS — N186 End stage renal disease: Secondary | ICD-10-CM | POA: Diagnosis not present

## 2011-12-03 DIAGNOSIS — N2581 Secondary hyperparathyroidism of renal origin: Secondary | ICD-10-CM | POA: Diagnosis not present

## 2011-12-03 DIAGNOSIS — D509 Iron deficiency anemia, unspecified: Secondary | ICD-10-CM | POA: Diagnosis not present

## 2011-12-06 DIAGNOSIS — D509 Iron deficiency anemia, unspecified: Secondary | ICD-10-CM | POA: Diagnosis not present

## 2011-12-06 DIAGNOSIS — N2581 Secondary hyperparathyroidism of renal origin: Secondary | ICD-10-CM | POA: Diagnosis not present

## 2011-12-06 DIAGNOSIS — N186 End stage renal disease: Secondary | ICD-10-CM | POA: Diagnosis not present

## 2011-12-08 DIAGNOSIS — N2581 Secondary hyperparathyroidism of renal origin: Secondary | ICD-10-CM | POA: Diagnosis not present

## 2011-12-08 DIAGNOSIS — N186 End stage renal disease: Secondary | ICD-10-CM | POA: Diagnosis not present

## 2011-12-08 DIAGNOSIS — D509 Iron deficiency anemia, unspecified: Secondary | ICD-10-CM | POA: Diagnosis not present

## 2011-12-10 DIAGNOSIS — N186 End stage renal disease: Secondary | ICD-10-CM | POA: Diagnosis not present

## 2011-12-10 DIAGNOSIS — N2581 Secondary hyperparathyroidism of renal origin: Secondary | ICD-10-CM | POA: Diagnosis not present

## 2011-12-10 DIAGNOSIS — D509 Iron deficiency anemia, unspecified: Secondary | ICD-10-CM | POA: Diagnosis not present

## 2011-12-13 DIAGNOSIS — N2581 Secondary hyperparathyroidism of renal origin: Secondary | ICD-10-CM | POA: Diagnosis not present

## 2011-12-13 DIAGNOSIS — D509 Iron deficiency anemia, unspecified: Secondary | ICD-10-CM | POA: Diagnosis not present

## 2011-12-13 DIAGNOSIS — N186 End stage renal disease: Secondary | ICD-10-CM | POA: Diagnosis not present

## 2011-12-15 DIAGNOSIS — D509 Iron deficiency anemia, unspecified: Secondary | ICD-10-CM | POA: Diagnosis not present

## 2011-12-15 DIAGNOSIS — N186 End stage renal disease: Secondary | ICD-10-CM | POA: Diagnosis not present

## 2011-12-15 DIAGNOSIS — N2581 Secondary hyperparathyroidism of renal origin: Secondary | ICD-10-CM | POA: Diagnosis not present

## 2011-12-17 DIAGNOSIS — N2581 Secondary hyperparathyroidism of renal origin: Secondary | ICD-10-CM | POA: Diagnosis not present

## 2011-12-17 DIAGNOSIS — D509 Iron deficiency anemia, unspecified: Secondary | ICD-10-CM | POA: Diagnosis not present

## 2011-12-17 DIAGNOSIS — N186 End stage renal disease: Secondary | ICD-10-CM | POA: Diagnosis not present

## 2011-12-20 DIAGNOSIS — N2581 Secondary hyperparathyroidism of renal origin: Secondary | ICD-10-CM | POA: Diagnosis not present

## 2011-12-20 DIAGNOSIS — Z992 Dependence on renal dialysis: Secondary | ICD-10-CM | POA: Diagnosis not present

## 2011-12-20 DIAGNOSIS — D509 Iron deficiency anemia, unspecified: Secondary | ICD-10-CM | POA: Diagnosis not present

## 2011-12-20 DIAGNOSIS — N186 End stage renal disease: Secondary | ICD-10-CM | POA: Diagnosis not present

## 2011-12-22 DIAGNOSIS — N186 End stage renal disease: Secondary | ICD-10-CM | POA: Diagnosis not present

## 2011-12-22 DIAGNOSIS — Z992 Dependence on renal dialysis: Secondary | ICD-10-CM | POA: Diagnosis not present

## 2011-12-22 DIAGNOSIS — N2581 Secondary hyperparathyroidism of renal origin: Secondary | ICD-10-CM | POA: Diagnosis not present

## 2011-12-22 DIAGNOSIS — D509 Iron deficiency anemia, unspecified: Secondary | ICD-10-CM | POA: Diagnosis not present

## 2011-12-24 DIAGNOSIS — N186 End stage renal disease: Secondary | ICD-10-CM | POA: Diagnosis not present

## 2011-12-24 DIAGNOSIS — Z992 Dependence on renal dialysis: Secondary | ICD-10-CM | POA: Diagnosis not present

## 2011-12-24 DIAGNOSIS — D509 Iron deficiency anemia, unspecified: Secondary | ICD-10-CM | POA: Diagnosis not present

## 2011-12-24 DIAGNOSIS — N2581 Secondary hyperparathyroidism of renal origin: Secondary | ICD-10-CM | POA: Diagnosis not present

## 2011-12-27 DIAGNOSIS — N186 End stage renal disease: Secondary | ICD-10-CM | POA: Diagnosis not present

## 2011-12-27 DIAGNOSIS — D509 Iron deficiency anemia, unspecified: Secondary | ICD-10-CM | POA: Diagnosis not present

## 2011-12-27 DIAGNOSIS — N2581 Secondary hyperparathyroidism of renal origin: Secondary | ICD-10-CM | POA: Diagnosis not present

## 2011-12-27 DIAGNOSIS — Z992 Dependence on renal dialysis: Secondary | ICD-10-CM | POA: Diagnosis not present

## 2011-12-29 DIAGNOSIS — N2581 Secondary hyperparathyroidism of renal origin: Secondary | ICD-10-CM | POA: Diagnosis not present

## 2011-12-29 DIAGNOSIS — D509 Iron deficiency anemia, unspecified: Secondary | ICD-10-CM | POA: Diagnosis not present

## 2011-12-29 DIAGNOSIS — Z992 Dependence on renal dialysis: Secondary | ICD-10-CM | POA: Diagnosis not present

## 2011-12-29 DIAGNOSIS — N186 End stage renal disease: Secondary | ICD-10-CM | POA: Diagnosis not present

## 2011-12-31 DIAGNOSIS — N186 End stage renal disease: Secondary | ICD-10-CM | POA: Diagnosis not present

## 2011-12-31 DIAGNOSIS — N2581 Secondary hyperparathyroidism of renal origin: Secondary | ICD-10-CM | POA: Diagnosis not present

## 2011-12-31 DIAGNOSIS — D509 Iron deficiency anemia, unspecified: Secondary | ICD-10-CM | POA: Diagnosis not present

## 2011-12-31 DIAGNOSIS — Z992 Dependence on renal dialysis: Secondary | ICD-10-CM | POA: Diagnosis not present

## 2012-01-03 DIAGNOSIS — D509 Iron deficiency anemia, unspecified: Secondary | ICD-10-CM | POA: Diagnosis not present

## 2012-01-03 DIAGNOSIS — N2581 Secondary hyperparathyroidism of renal origin: Secondary | ICD-10-CM | POA: Diagnosis not present

## 2012-01-03 DIAGNOSIS — N186 End stage renal disease: Secondary | ICD-10-CM | POA: Diagnosis not present

## 2012-01-03 DIAGNOSIS — Z992 Dependence on renal dialysis: Secondary | ICD-10-CM | POA: Diagnosis not present

## 2012-01-05 DIAGNOSIS — Z992 Dependence on renal dialysis: Secondary | ICD-10-CM | POA: Diagnosis not present

## 2012-01-05 DIAGNOSIS — D509 Iron deficiency anemia, unspecified: Secondary | ICD-10-CM | POA: Diagnosis not present

## 2012-01-05 DIAGNOSIS — N2581 Secondary hyperparathyroidism of renal origin: Secondary | ICD-10-CM | POA: Diagnosis not present

## 2012-01-05 DIAGNOSIS — N186 End stage renal disease: Secondary | ICD-10-CM | POA: Diagnosis not present

## 2012-01-06 DIAGNOSIS — Z992 Dependence on renal dialysis: Secondary | ICD-10-CM | POA: Diagnosis not present

## 2012-01-06 DIAGNOSIS — D509 Iron deficiency anemia, unspecified: Secondary | ICD-10-CM | POA: Diagnosis not present

## 2012-01-06 DIAGNOSIS — N186 End stage renal disease: Secondary | ICD-10-CM | POA: Diagnosis not present

## 2012-01-06 DIAGNOSIS — N2581 Secondary hyperparathyroidism of renal origin: Secondary | ICD-10-CM | POA: Diagnosis not present

## 2012-01-09 DIAGNOSIS — N186 End stage renal disease: Secondary | ICD-10-CM | POA: Diagnosis not present

## 2012-01-12 DIAGNOSIS — N186 End stage renal disease: Secondary | ICD-10-CM | POA: Diagnosis not present

## 2012-01-14 DIAGNOSIS — N186 End stage renal disease: Secondary | ICD-10-CM | POA: Diagnosis not present

## 2012-01-16 DIAGNOSIS — N186 End stage renal disease: Secondary | ICD-10-CM | POA: Diagnosis not present

## 2012-01-17 DIAGNOSIS — N186 End stage renal disease: Secondary | ICD-10-CM | POA: Diagnosis not present

## 2012-01-19 DIAGNOSIS — N186 End stage renal disease: Secondary | ICD-10-CM | POA: Diagnosis not present

## 2012-01-21 DIAGNOSIS — N186 End stage renal disease: Secondary | ICD-10-CM | POA: Diagnosis not present

## 2012-01-24 DIAGNOSIS — D509 Iron deficiency anemia, unspecified: Secondary | ICD-10-CM | POA: Diagnosis not present

## 2012-01-24 DIAGNOSIS — N039 Chronic nephritic syndrome with unspecified morphologic changes: Secondary | ICD-10-CM | POA: Diagnosis not present

## 2012-01-24 DIAGNOSIS — N2581 Secondary hyperparathyroidism of renal origin: Secondary | ICD-10-CM | POA: Diagnosis not present

## 2012-01-24 DIAGNOSIS — N186 End stage renal disease: Secondary | ICD-10-CM | POA: Diagnosis not present

## 2012-01-24 DIAGNOSIS — D631 Anemia in chronic kidney disease: Secondary | ICD-10-CM | POA: Diagnosis not present

## 2012-01-24 DIAGNOSIS — Z992 Dependence on renal dialysis: Secondary | ICD-10-CM | POA: Diagnosis not present

## 2012-01-26 DIAGNOSIS — D631 Anemia in chronic kidney disease: Secondary | ICD-10-CM | POA: Diagnosis not present

## 2012-01-26 DIAGNOSIS — Z992 Dependence on renal dialysis: Secondary | ICD-10-CM | POA: Diagnosis not present

## 2012-01-26 DIAGNOSIS — N2581 Secondary hyperparathyroidism of renal origin: Secondary | ICD-10-CM | POA: Diagnosis not present

## 2012-01-26 DIAGNOSIS — N186 End stage renal disease: Secondary | ICD-10-CM | POA: Diagnosis not present

## 2012-01-26 DIAGNOSIS — D509 Iron deficiency anemia, unspecified: Secondary | ICD-10-CM | POA: Diagnosis not present

## 2012-01-28 DIAGNOSIS — N2581 Secondary hyperparathyroidism of renal origin: Secondary | ICD-10-CM | POA: Diagnosis not present

## 2012-01-28 DIAGNOSIS — N186 End stage renal disease: Secondary | ICD-10-CM | POA: Diagnosis not present

## 2012-01-28 DIAGNOSIS — D631 Anemia in chronic kidney disease: Secondary | ICD-10-CM | POA: Diagnosis not present

## 2012-01-28 DIAGNOSIS — Z992 Dependence on renal dialysis: Secondary | ICD-10-CM | POA: Diagnosis not present

## 2012-01-28 DIAGNOSIS — D509 Iron deficiency anemia, unspecified: Secondary | ICD-10-CM | POA: Diagnosis not present

## 2012-01-31 DIAGNOSIS — D509 Iron deficiency anemia, unspecified: Secondary | ICD-10-CM | POA: Diagnosis not present

## 2012-01-31 DIAGNOSIS — N2581 Secondary hyperparathyroidism of renal origin: Secondary | ICD-10-CM | POA: Diagnosis not present

## 2012-01-31 DIAGNOSIS — N186 End stage renal disease: Secondary | ICD-10-CM | POA: Diagnosis not present

## 2012-01-31 DIAGNOSIS — Z992 Dependence on renal dialysis: Secondary | ICD-10-CM | POA: Diagnosis not present

## 2012-01-31 DIAGNOSIS — N039 Chronic nephritic syndrome with unspecified morphologic changes: Secondary | ICD-10-CM | POA: Diagnosis not present

## 2012-02-02 DIAGNOSIS — N186 End stage renal disease: Secondary | ICD-10-CM | POA: Diagnosis not present

## 2012-02-02 DIAGNOSIS — D509 Iron deficiency anemia, unspecified: Secondary | ICD-10-CM | POA: Diagnosis not present

## 2012-02-02 DIAGNOSIS — Z992 Dependence on renal dialysis: Secondary | ICD-10-CM | POA: Diagnosis not present

## 2012-02-02 DIAGNOSIS — N2581 Secondary hyperparathyroidism of renal origin: Secondary | ICD-10-CM | POA: Diagnosis not present

## 2012-02-02 DIAGNOSIS — N039 Chronic nephritic syndrome with unspecified morphologic changes: Secondary | ICD-10-CM | POA: Diagnosis not present

## 2012-02-04 DIAGNOSIS — D509 Iron deficiency anemia, unspecified: Secondary | ICD-10-CM | POA: Diagnosis not present

## 2012-02-04 DIAGNOSIS — Z992 Dependence on renal dialysis: Secondary | ICD-10-CM | POA: Diagnosis not present

## 2012-02-04 DIAGNOSIS — N186 End stage renal disease: Secondary | ICD-10-CM | POA: Diagnosis not present

## 2012-02-04 DIAGNOSIS — N039 Chronic nephritic syndrome with unspecified morphologic changes: Secondary | ICD-10-CM | POA: Diagnosis not present

## 2012-02-04 DIAGNOSIS — N2581 Secondary hyperparathyroidism of renal origin: Secondary | ICD-10-CM | POA: Diagnosis not present

## 2012-02-07 DIAGNOSIS — D509 Iron deficiency anemia, unspecified: Secondary | ICD-10-CM | POA: Diagnosis not present

## 2012-02-07 DIAGNOSIS — N186 End stage renal disease: Secondary | ICD-10-CM | POA: Diagnosis not present

## 2012-02-07 DIAGNOSIS — Z992 Dependence on renal dialysis: Secondary | ICD-10-CM | POA: Diagnosis not present

## 2012-02-07 DIAGNOSIS — N2581 Secondary hyperparathyroidism of renal origin: Secondary | ICD-10-CM | POA: Diagnosis not present

## 2012-02-07 DIAGNOSIS — N039 Chronic nephritic syndrome with unspecified morphologic changes: Secondary | ICD-10-CM | POA: Diagnosis not present

## 2012-02-09 DIAGNOSIS — D509 Iron deficiency anemia, unspecified: Secondary | ICD-10-CM | POA: Diagnosis not present

## 2012-02-09 DIAGNOSIS — N2581 Secondary hyperparathyroidism of renal origin: Secondary | ICD-10-CM | POA: Diagnosis not present

## 2012-02-09 DIAGNOSIS — Z992 Dependence on renal dialysis: Secondary | ICD-10-CM | POA: Diagnosis not present

## 2012-02-09 DIAGNOSIS — N186 End stage renal disease: Secondary | ICD-10-CM | POA: Diagnosis not present

## 2012-02-09 DIAGNOSIS — N039 Chronic nephritic syndrome with unspecified morphologic changes: Secondary | ICD-10-CM | POA: Diagnosis not present

## 2012-02-11 DIAGNOSIS — D509 Iron deficiency anemia, unspecified: Secondary | ICD-10-CM | POA: Diagnosis not present

## 2012-02-11 DIAGNOSIS — Z992 Dependence on renal dialysis: Secondary | ICD-10-CM | POA: Diagnosis not present

## 2012-02-11 DIAGNOSIS — N186 End stage renal disease: Secondary | ICD-10-CM | POA: Diagnosis not present

## 2012-02-11 DIAGNOSIS — D631 Anemia in chronic kidney disease: Secondary | ICD-10-CM | POA: Diagnosis not present

## 2012-02-11 DIAGNOSIS — N2581 Secondary hyperparathyroidism of renal origin: Secondary | ICD-10-CM | POA: Diagnosis not present

## 2012-02-14 DIAGNOSIS — Z992 Dependence on renal dialysis: Secondary | ICD-10-CM | POA: Diagnosis not present

## 2012-02-14 DIAGNOSIS — N186 End stage renal disease: Secondary | ICD-10-CM | POA: Diagnosis not present

## 2012-02-14 DIAGNOSIS — D631 Anemia in chronic kidney disease: Secondary | ICD-10-CM | POA: Diagnosis not present

## 2012-02-14 DIAGNOSIS — N2581 Secondary hyperparathyroidism of renal origin: Secondary | ICD-10-CM | POA: Diagnosis not present

## 2012-02-14 DIAGNOSIS — D509 Iron deficiency anemia, unspecified: Secondary | ICD-10-CM | POA: Diagnosis not present

## 2012-02-16 DIAGNOSIS — N2581 Secondary hyperparathyroidism of renal origin: Secondary | ICD-10-CM | POA: Diagnosis not present

## 2012-02-16 DIAGNOSIS — D509 Iron deficiency anemia, unspecified: Secondary | ICD-10-CM | POA: Diagnosis not present

## 2012-02-16 DIAGNOSIS — D631 Anemia in chronic kidney disease: Secondary | ICD-10-CM | POA: Diagnosis not present

## 2012-02-16 DIAGNOSIS — Z992 Dependence on renal dialysis: Secondary | ICD-10-CM | POA: Diagnosis not present

## 2012-02-16 DIAGNOSIS — N186 End stage renal disease: Secondary | ICD-10-CM | POA: Diagnosis not present

## 2012-02-17 DIAGNOSIS — N186 End stage renal disease: Secondary | ICD-10-CM | POA: Diagnosis not present

## 2012-02-18 DIAGNOSIS — N186 End stage renal disease: Secondary | ICD-10-CM | POA: Diagnosis not present

## 2012-02-18 DIAGNOSIS — D631 Anemia in chronic kidney disease: Secondary | ICD-10-CM | POA: Diagnosis not present

## 2012-02-18 DIAGNOSIS — N2581 Secondary hyperparathyroidism of renal origin: Secondary | ICD-10-CM | POA: Diagnosis not present

## 2012-03-16 DIAGNOSIS — N186 End stage renal disease: Secondary | ICD-10-CM | POA: Diagnosis not present

## 2012-03-17 DIAGNOSIS — D509 Iron deficiency anemia, unspecified: Secondary | ICD-10-CM | POA: Diagnosis not present

## 2012-03-17 DIAGNOSIS — D631 Anemia in chronic kidney disease: Secondary | ICD-10-CM | POA: Diagnosis not present

## 2012-03-17 DIAGNOSIS — N2581 Secondary hyperparathyroidism of renal origin: Secondary | ICD-10-CM | POA: Diagnosis not present

## 2012-03-17 DIAGNOSIS — N186 End stage renal disease: Secondary | ICD-10-CM | POA: Diagnosis not present

## 2012-04-16 DIAGNOSIS — N186 End stage renal disease: Secondary | ICD-10-CM | POA: Diagnosis not present

## 2012-04-17 DIAGNOSIS — D631 Anemia in chronic kidney disease: Secondary | ICD-10-CM | POA: Diagnosis not present

## 2012-04-17 DIAGNOSIS — N2581 Secondary hyperparathyroidism of renal origin: Secondary | ICD-10-CM | POA: Diagnosis not present

## 2012-04-17 DIAGNOSIS — D509 Iron deficiency anemia, unspecified: Secondary | ICD-10-CM | POA: Diagnosis not present

## 2012-04-17 DIAGNOSIS — N039 Chronic nephritic syndrome with unspecified morphologic changes: Secondary | ICD-10-CM | POA: Diagnosis not present

## 2012-04-17 DIAGNOSIS — N186 End stage renal disease: Secondary | ICD-10-CM | POA: Diagnosis not present

## 2012-04-19 DIAGNOSIS — N186 End stage renal disease: Secondary | ICD-10-CM | POA: Diagnosis not present

## 2012-04-19 DIAGNOSIS — N2581 Secondary hyperparathyroidism of renal origin: Secondary | ICD-10-CM | POA: Diagnosis not present

## 2012-04-19 DIAGNOSIS — D509 Iron deficiency anemia, unspecified: Secondary | ICD-10-CM | POA: Diagnosis not present

## 2012-04-19 DIAGNOSIS — D631 Anemia in chronic kidney disease: Secondary | ICD-10-CM | POA: Diagnosis not present

## 2012-04-21 DIAGNOSIS — N2581 Secondary hyperparathyroidism of renal origin: Secondary | ICD-10-CM | POA: Diagnosis not present

## 2012-04-21 DIAGNOSIS — D631 Anemia in chronic kidney disease: Secondary | ICD-10-CM | POA: Diagnosis not present

## 2012-04-21 DIAGNOSIS — D509 Iron deficiency anemia, unspecified: Secondary | ICD-10-CM | POA: Diagnosis not present

## 2012-04-21 DIAGNOSIS — N186 End stage renal disease: Secondary | ICD-10-CM | POA: Diagnosis not present

## 2012-04-24 DIAGNOSIS — N186 End stage renal disease: Secondary | ICD-10-CM | POA: Diagnosis not present

## 2012-04-24 DIAGNOSIS — D631 Anemia in chronic kidney disease: Secondary | ICD-10-CM | POA: Diagnosis not present

## 2012-04-24 DIAGNOSIS — N2581 Secondary hyperparathyroidism of renal origin: Secondary | ICD-10-CM | POA: Diagnosis not present

## 2012-04-24 DIAGNOSIS — D509 Iron deficiency anemia, unspecified: Secondary | ICD-10-CM | POA: Diagnosis not present

## 2012-04-26 DIAGNOSIS — D509 Iron deficiency anemia, unspecified: Secondary | ICD-10-CM | POA: Diagnosis not present

## 2012-04-26 DIAGNOSIS — D631 Anemia in chronic kidney disease: Secondary | ICD-10-CM | POA: Diagnosis not present

## 2012-04-26 DIAGNOSIS — N186 End stage renal disease: Secondary | ICD-10-CM | POA: Diagnosis not present

## 2012-04-26 DIAGNOSIS — N039 Chronic nephritic syndrome with unspecified morphologic changes: Secondary | ICD-10-CM | POA: Diagnosis not present

## 2012-04-26 DIAGNOSIS — N2581 Secondary hyperparathyroidism of renal origin: Secondary | ICD-10-CM | POA: Diagnosis not present

## 2012-04-28 DIAGNOSIS — N039 Chronic nephritic syndrome with unspecified morphologic changes: Secondary | ICD-10-CM | POA: Diagnosis not present

## 2012-04-28 DIAGNOSIS — N2581 Secondary hyperparathyroidism of renal origin: Secondary | ICD-10-CM | POA: Diagnosis not present

## 2012-04-28 DIAGNOSIS — D509 Iron deficiency anemia, unspecified: Secondary | ICD-10-CM | POA: Diagnosis not present

## 2012-04-28 DIAGNOSIS — D631 Anemia in chronic kidney disease: Secondary | ICD-10-CM | POA: Diagnosis not present

## 2012-04-28 DIAGNOSIS — N186 End stage renal disease: Secondary | ICD-10-CM | POA: Diagnosis not present

## 2012-05-01 DIAGNOSIS — N039 Chronic nephritic syndrome with unspecified morphologic changes: Secondary | ICD-10-CM | POA: Diagnosis not present

## 2012-05-01 DIAGNOSIS — N186 End stage renal disease: Secondary | ICD-10-CM | POA: Diagnosis not present

## 2012-05-01 DIAGNOSIS — D509 Iron deficiency anemia, unspecified: Secondary | ICD-10-CM | POA: Diagnosis not present

## 2012-05-01 DIAGNOSIS — N2581 Secondary hyperparathyroidism of renal origin: Secondary | ICD-10-CM | POA: Diagnosis not present

## 2012-05-03 DIAGNOSIS — N039 Chronic nephritic syndrome with unspecified morphologic changes: Secondary | ICD-10-CM | POA: Diagnosis not present

## 2012-05-03 DIAGNOSIS — N2581 Secondary hyperparathyroidism of renal origin: Secondary | ICD-10-CM | POA: Diagnosis not present

## 2012-05-03 DIAGNOSIS — N186 End stage renal disease: Secondary | ICD-10-CM | POA: Diagnosis not present

## 2012-05-03 DIAGNOSIS — D509 Iron deficiency anemia, unspecified: Secondary | ICD-10-CM | POA: Diagnosis not present

## 2012-05-05 DIAGNOSIS — D631 Anemia in chronic kidney disease: Secondary | ICD-10-CM | POA: Diagnosis not present

## 2012-05-05 DIAGNOSIS — N186 End stage renal disease: Secondary | ICD-10-CM | POA: Diagnosis not present

## 2012-05-05 DIAGNOSIS — D509 Iron deficiency anemia, unspecified: Secondary | ICD-10-CM | POA: Diagnosis not present

## 2012-05-05 DIAGNOSIS — N2581 Secondary hyperparathyroidism of renal origin: Secondary | ICD-10-CM | POA: Diagnosis not present

## 2012-05-08 DIAGNOSIS — D631 Anemia in chronic kidney disease: Secondary | ICD-10-CM | POA: Diagnosis not present

## 2012-05-08 DIAGNOSIS — D509 Iron deficiency anemia, unspecified: Secondary | ICD-10-CM | POA: Diagnosis not present

## 2012-05-08 DIAGNOSIS — N2581 Secondary hyperparathyroidism of renal origin: Secondary | ICD-10-CM | POA: Diagnosis not present

## 2012-05-08 DIAGNOSIS — N039 Chronic nephritic syndrome with unspecified morphologic changes: Secondary | ICD-10-CM | POA: Diagnosis not present

## 2012-05-08 DIAGNOSIS — N186 End stage renal disease: Secondary | ICD-10-CM | POA: Diagnosis not present

## 2012-05-10 DIAGNOSIS — D631 Anemia in chronic kidney disease: Secondary | ICD-10-CM | POA: Diagnosis not present

## 2012-05-10 DIAGNOSIS — D509 Iron deficiency anemia, unspecified: Secondary | ICD-10-CM | POA: Diagnosis not present

## 2012-05-10 DIAGNOSIS — N186 End stage renal disease: Secondary | ICD-10-CM | POA: Diagnosis not present

## 2012-05-10 DIAGNOSIS — N2581 Secondary hyperparathyroidism of renal origin: Secondary | ICD-10-CM | POA: Diagnosis not present

## 2012-05-12 DIAGNOSIS — D631 Anemia in chronic kidney disease: Secondary | ICD-10-CM | POA: Diagnosis not present

## 2012-05-12 DIAGNOSIS — N186 End stage renal disease: Secondary | ICD-10-CM | POA: Diagnosis not present

## 2012-05-12 DIAGNOSIS — D509 Iron deficiency anemia, unspecified: Secondary | ICD-10-CM | POA: Diagnosis not present

## 2012-05-12 DIAGNOSIS — N2581 Secondary hyperparathyroidism of renal origin: Secondary | ICD-10-CM | POA: Diagnosis not present

## 2012-05-15 DIAGNOSIS — D631 Anemia in chronic kidney disease: Secondary | ICD-10-CM | POA: Diagnosis not present

## 2012-05-15 DIAGNOSIS — D509 Iron deficiency anemia, unspecified: Secondary | ICD-10-CM | POA: Diagnosis not present

## 2012-05-15 DIAGNOSIS — N186 End stage renal disease: Secondary | ICD-10-CM | POA: Diagnosis not present

## 2012-05-15 DIAGNOSIS — N2581 Secondary hyperparathyroidism of renal origin: Secondary | ICD-10-CM | POA: Diagnosis not present

## 2012-05-17 DIAGNOSIS — D509 Iron deficiency anemia, unspecified: Secondary | ICD-10-CM | POA: Diagnosis not present

## 2012-05-17 DIAGNOSIS — N2581 Secondary hyperparathyroidism of renal origin: Secondary | ICD-10-CM | POA: Diagnosis not present

## 2012-05-17 DIAGNOSIS — N186 End stage renal disease: Secondary | ICD-10-CM | POA: Diagnosis not present

## 2012-05-17 DIAGNOSIS — D631 Anemia in chronic kidney disease: Secondary | ICD-10-CM | POA: Diagnosis not present

## 2012-06-16 DIAGNOSIS — N186 End stage renal disease: Secondary | ICD-10-CM | POA: Diagnosis not present

## 2012-06-19 DIAGNOSIS — D509 Iron deficiency anemia, unspecified: Secondary | ICD-10-CM | POA: Diagnosis not present

## 2012-06-19 DIAGNOSIS — D631 Anemia in chronic kidney disease: Secondary | ICD-10-CM | POA: Diagnosis not present

## 2012-06-19 DIAGNOSIS — N2581 Secondary hyperparathyroidism of renal origin: Secondary | ICD-10-CM | POA: Diagnosis not present

## 2012-06-19 DIAGNOSIS — N186 End stage renal disease: Secondary | ICD-10-CM | POA: Diagnosis not present

## 2012-07-16 DIAGNOSIS — N186 End stage renal disease: Secondary | ICD-10-CM | POA: Diagnosis not present

## 2012-07-17 DIAGNOSIS — D509 Iron deficiency anemia, unspecified: Secondary | ICD-10-CM | POA: Diagnosis not present

## 2012-07-17 DIAGNOSIS — N2581 Secondary hyperparathyroidism of renal origin: Secondary | ICD-10-CM | POA: Diagnosis not present

## 2012-07-17 DIAGNOSIS — N186 End stage renal disease: Secondary | ICD-10-CM | POA: Diagnosis not present

## 2012-08-16 DIAGNOSIS — N186 End stage renal disease: Secondary | ICD-10-CM | POA: Diagnosis not present

## 2012-08-18 DIAGNOSIS — D631 Anemia in chronic kidney disease: Secondary | ICD-10-CM | POA: Diagnosis not present

## 2012-08-18 DIAGNOSIS — D509 Iron deficiency anemia, unspecified: Secondary | ICD-10-CM | POA: Diagnosis not present

## 2012-08-18 DIAGNOSIS — N2581 Secondary hyperparathyroidism of renal origin: Secondary | ICD-10-CM | POA: Diagnosis not present

## 2012-08-18 DIAGNOSIS — N186 End stage renal disease: Secondary | ICD-10-CM | POA: Diagnosis not present

## 2012-09-16 DIAGNOSIS — N186 End stage renal disease: Secondary | ICD-10-CM | POA: Diagnosis not present

## 2012-09-18 DIAGNOSIS — N186 End stage renal disease: Secondary | ICD-10-CM | POA: Diagnosis not present

## 2012-09-18 DIAGNOSIS — D631 Anemia in chronic kidney disease: Secondary | ICD-10-CM | POA: Diagnosis not present

## 2012-09-18 DIAGNOSIS — N2581 Secondary hyperparathyroidism of renal origin: Secondary | ICD-10-CM | POA: Diagnosis not present

## 2012-09-18 DIAGNOSIS — D509 Iron deficiency anemia, unspecified: Secondary | ICD-10-CM | POA: Diagnosis not present

## 2012-10-16 DIAGNOSIS — N186 End stage renal disease: Secondary | ICD-10-CM | POA: Diagnosis not present

## 2012-10-18 DIAGNOSIS — N186 End stage renal disease: Secondary | ICD-10-CM | POA: Diagnosis not present

## 2012-10-18 DIAGNOSIS — D631 Anemia in chronic kidney disease: Secondary | ICD-10-CM | POA: Diagnosis not present

## 2012-10-18 DIAGNOSIS — N2581 Secondary hyperparathyroidism of renal origin: Secondary | ICD-10-CM | POA: Diagnosis not present

## 2012-10-18 DIAGNOSIS — D509 Iron deficiency anemia, unspecified: Secondary | ICD-10-CM | POA: Diagnosis not present

## 2012-10-20 DIAGNOSIS — N2581 Secondary hyperparathyroidism of renal origin: Secondary | ICD-10-CM | POA: Diagnosis not present

## 2012-10-20 DIAGNOSIS — D631 Anemia in chronic kidney disease: Secondary | ICD-10-CM | POA: Diagnosis not present

## 2012-10-20 DIAGNOSIS — D509 Iron deficiency anemia, unspecified: Secondary | ICD-10-CM | POA: Diagnosis not present

## 2012-10-20 DIAGNOSIS — N186 End stage renal disease: Secondary | ICD-10-CM | POA: Diagnosis not present

## 2012-10-23 DIAGNOSIS — N2581 Secondary hyperparathyroidism of renal origin: Secondary | ICD-10-CM | POA: Diagnosis not present

## 2012-10-23 DIAGNOSIS — N186 End stage renal disease: Secondary | ICD-10-CM | POA: Diagnosis not present

## 2012-10-23 DIAGNOSIS — D631 Anemia in chronic kidney disease: Secondary | ICD-10-CM | POA: Diagnosis not present

## 2012-10-23 DIAGNOSIS — D509 Iron deficiency anemia, unspecified: Secondary | ICD-10-CM | POA: Diagnosis not present

## 2012-10-25 DIAGNOSIS — D509 Iron deficiency anemia, unspecified: Secondary | ICD-10-CM | POA: Diagnosis not present

## 2012-10-25 DIAGNOSIS — D631 Anemia in chronic kidney disease: Secondary | ICD-10-CM | POA: Diagnosis not present

## 2012-10-25 DIAGNOSIS — N2581 Secondary hyperparathyroidism of renal origin: Secondary | ICD-10-CM | POA: Diagnosis not present

## 2012-10-25 DIAGNOSIS — N186 End stage renal disease: Secondary | ICD-10-CM | POA: Diagnosis not present

## 2012-10-27 DIAGNOSIS — N186 End stage renal disease: Secondary | ICD-10-CM | POA: Diagnosis not present

## 2012-10-27 DIAGNOSIS — D631 Anemia in chronic kidney disease: Secondary | ICD-10-CM | POA: Diagnosis not present

## 2012-10-27 DIAGNOSIS — D509 Iron deficiency anemia, unspecified: Secondary | ICD-10-CM | POA: Diagnosis not present

## 2012-10-27 DIAGNOSIS — N2581 Secondary hyperparathyroidism of renal origin: Secondary | ICD-10-CM | POA: Diagnosis not present

## 2012-10-30 DIAGNOSIS — N2581 Secondary hyperparathyroidism of renal origin: Secondary | ICD-10-CM | POA: Diagnosis not present

## 2012-10-30 DIAGNOSIS — N186 End stage renal disease: Secondary | ICD-10-CM | POA: Diagnosis not present

## 2012-10-30 DIAGNOSIS — D509 Iron deficiency anemia, unspecified: Secondary | ICD-10-CM | POA: Diagnosis not present

## 2012-10-30 DIAGNOSIS — D631 Anemia in chronic kidney disease: Secondary | ICD-10-CM | POA: Diagnosis not present

## 2012-11-01 DIAGNOSIS — N2581 Secondary hyperparathyroidism of renal origin: Secondary | ICD-10-CM | POA: Diagnosis not present

## 2012-11-01 DIAGNOSIS — D631 Anemia in chronic kidney disease: Secondary | ICD-10-CM | POA: Diagnosis not present

## 2012-11-01 DIAGNOSIS — D509 Iron deficiency anemia, unspecified: Secondary | ICD-10-CM | POA: Diagnosis not present

## 2012-11-01 DIAGNOSIS — N186 End stage renal disease: Secondary | ICD-10-CM | POA: Diagnosis not present

## 2012-11-03 DIAGNOSIS — N2581 Secondary hyperparathyroidism of renal origin: Secondary | ICD-10-CM | POA: Diagnosis not present

## 2012-11-03 DIAGNOSIS — D509 Iron deficiency anemia, unspecified: Secondary | ICD-10-CM | POA: Diagnosis not present

## 2012-11-03 DIAGNOSIS — N186 End stage renal disease: Secondary | ICD-10-CM | POA: Diagnosis not present

## 2012-11-03 DIAGNOSIS — D631 Anemia in chronic kidney disease: Secondary | ICD-10-CM | POA: Diagnosis not present

## 2012-11-06 DIAGNOSIS — D631 Anemia in chronic kidney disease: Secondary | ICD-10-CM | POA: Diagnosis not present

## 2012-11-06 DIAGNOSIS — D509 Iron deficiency anemia, unspecified: Secondary | ICD-10-CM | POA: Diagnosis not present

## 2012-11-06 DIAGNOSIS — N186 End stage renal disease: Secondary | ICD-10-CM | POA: Diagnosis not present

## 2012-11-06 DIAGNOSIS — N2581 Secondary hyperparathyroidism of renal origin: Secondary | ICD-10-CM | POA: Diagnosis not present

## 2012-11-08 DIAGNOSIS — N2581 Secondary hyperparathyroidism of renal origin: Secondary | ICD-10-CM | POA: Diagnosis not present

## 2012-11-08 DIAGNOSIS — N186 End stage renal disease: Secondary | ICD-10-CM | POA: Diagnosis not present

## 2012-11-08 DIAGNOSIS — D631 Anemia in chronic kidney disease: Secondary | ICD-10-CM | POA: Diagnosis not present

## 2012-11-08 DIAGNOSIS — D509 Iron deficiency anemia, unspecified: Secondary | ICD-10-CM | POA: Diagnosis not present

## 2012-11-10 DIAGNOSIS — N186 End stage renal disease: Secondary | ICD-10-CM | POA: Diagnosis not present

## 2012-11-10 DIAGNOSIS — D631 Anemia in chronic kidney disease: Secondary | ICD-10-CM | POA: Diagnosis not present

## 2012-11-10 DIAGNOSIS — N2581 Secondary hyperparathyroidism of renal origin: Secondary | ICD-10-CM | POA: Diagnosis not present

## 2012-11-10 DIAGNOSIS — D509 Iron deficiency anemia, unspecified: Secondary | ICD-10-CM | POA: Diagnosis not present

## 2012-11-13 DIAGNOSIS — D631 Anemia in chronic kidney disease: Secondary | ICD-10-CM | POA: Diagnosis not present

## 2012-11-13 DIAGNOSIS — N186 End stage renal disease: Secondary | ICD-10-CM | POA: Diagnosis not present

## 2012-11-13 DIAGNOSIS — D509 Iron deficiency anemia, unspecified: Secondary | ICD-10-CM | POA: Diagnosis not present

## 2012-11-13 DIAGNOSIS — N2581 Secondary hyperparathyroidism of renal origin: Secondary | ICD-10-CM | POA: Diagnosis not present

## 2012-11-15 DIAGNOSIS — D631 Anemia in chronic kidney disease: Secondary | ICD-10-CM | POA: Diagnosis not present

## 2012-11-15 DIAGNOSIS — N186 End stage renal disease: Secondary | ICD-10-CM | POA: Diagnosis not present

## 2012-11-15 DIAGNOSIS — D509 Iron deficiency anemia, unspecified: Secondary | ICD-10-CM | POA: Diagnosis not present

## 2012-11-15 DIAGNOSIS — N2581 Secondary hyperparathyroidism of renal origin: Secondary | ICD-10-CM | POA: Diagnosis not present

## 2012-11-16 DIAGNOSIS — N186 End stage renal disease: Secondary | ICD-10-CM | POA: Diagnosis not present

## 2012-11-17 DIAGNOSIS — N186 End stage renal disease: Secondary | ICD-10-CM | POA: Diagnosis not present

## 2012-11-17 DIAGNOSIS — D631 Anemia in chronic kidney disease: Secondary | ICD-10-CM | POA: Diagnosis not present

## 2012-11-17 DIAGNOSIS — N2581 Secondary hyperparathyroidism of renal origin: Secondary | ICD-10-CM | POA: Diagnosis not present

## 2012-11-17 DIAGNOSIS — D509 Iron deficiency anemia, unspecified: Secondary | ICD-10-CM | POA: Diagnosis not present

## 2012-11-20 DIAGNOSIS — D509 Iron deficiency anemia, unspecified: Secondary | ICD-10-CM | POA: Diagnosis not present

## 2012-11-20 DIAGNOSIS — D631 Anemia in chronic kidney disease: Secondary | ICD-10-CM | POA: Diagnosis not present

## 2012-11-20 DIAGNOSIS — N2581 Secondary hyperparathyroidism of renal origin: Secondary | ICD-10-CM | POA: Diagnosis not present

## 2012-11-20 DIAGNOSIS — N186 End stage renal disease: Secondary | ICD-10-CM | POA: Diagnosis not present

## 2012-11-22 DIAGNOSIS — D509 Iron deficiency anemia, unspecified: Secondary | ICD-10-CM | POA: Diagnosis not present

## 2012-11-22 DIAGNOSIS — N186 End stage renal disease: Secondary | ICD-10-CM | POA: Diagnosis not present

## 2012-11-22 DIAGNOSIS — D631 Anemia in chronic kidney disease: Secondary | ICD-10-CM | POA: Diagnosis not present

## 2012-11-22 DIAGNOSIS — N2581 Secondary hyperparathyroidism of renal origin: Secondary | ICD-10-CM | POA: Diagnosis not present

## 2012-11-24 DIAGNOSIS — D631 Anemia in chronic kidney disease: Secondary | ICD-10-CM | POA: Diagnosis not present

## 2012-11-24 DIAGNOSIS — N2581 Secondary hyperparathyroidism of renal origin: Secondary | ICD-10-CM | POA: Diagnosis not present

## 2012-11-24 DIAGNOSIS — N186 End stage renal disease: Secondary | ICD-10-CM | POA: Diagnosis not present

## 2012-11-24 DIAGNOSIS — D509 Iron deficiency anemia, unspecified: Secondary | ICD-10-CM | POA: Diagnosis not present

## 2012-11-27 DIAGNOSIS — D509 Iron deficiency anemia, unspecified: Secondary | ICD-10-CM | POA: Diagnosis not present

## 2012-11-27 DIAGNOSIS — N2581 Secondary hyperparathyroidism of renal origin: Secondary | ICD-10-CM | POA: Diagnosis not present

## 2012-11-27 DIAGNOSIS — D631 Anemia in chronic kidney disease: Secondary | ICD-10-CM | POA: Diagnosis not present

## 2012-11-27 DIAGNOSIS — N186 End stage renal disease: Secondary | ICD-10-CM | POA: Diagnosis not present

## 2012-11-29 DIAGNOSIS — N186 End stage renal disease: Secondary | ICD-10-CM | POA: Diagnosis not present

## 2012-11-29 DIAGNOSIS — D631 Anemia in chronic kidney disease: Secondary | ICD-10-CM | POA: Diagnosis not present

## 2012-11-29 DIAGNOSIS — N2581 Secondary hyperparathyroidism of renal origin: Secondary | ICD-10-CM | POA: Diagnosis not present

## 2012-11-29 DIAGNOSIS — D509 Iron deficiency anemia, unspecified: Secondary | ICD-10-CM | POA: Diagnosis not present

## 2012-12-01 DIAGNOSIS — N186 End stage renal disease: Secondary | ICD-10-CM | POA: Diagnosis not present

## 2012-12-01 DIAGNOSIS — D509 Iron deficiency anemia, unspecified: Secondary | ICD-10-CM | POA: Diagnosis not present

## 2012-12-01 DIAGNOSIS — N2581 Secondary hyperparathyroidism of renal origin: Secondary | ICD-10-CM | POA: Diagnosis not present

## 2012-12-01 DIAGNOSIS — D631 Anemia in chronic kidney disease: Secondary | ICD-10-CM | POA: Diagnosis not present

## 2012-12-04 DIAGNOSIS — D631 Anemia in chronic kidney disease: Secondary | ICD-10-CM | POA: Diagnosis not present

## 2012-12-04 DIAGNOSIS — N186 End stage renal disease: Secondary | ICD-10-CM | POA: Diagnosis not present

## 2012-12-04 DIAGNOSIS — N2581 Secondary hyperparathyroidism of renal origin: Secondary | ICD-10-CM | POA: Diagnosis not present

## 2012-12-04 DIAGNOSIS — D509 Iron deficiency anemia, unspecified: Secondary | ICD-10-CM | POA: Diagnosis not present

## 2012-12-06 DIAGNOSIS — D509 Iron deficiency anemia, unspecified: Secondary | ICD-10-CM | POA: Diagnosis not present

## 2012-12-06 DIAGNOSIS — N2581 Secondary hyperparathyroidism of renal origin: Secondary | ICD-10-CM | POA: Diagnosis not present

## 2012-12-06 DIAGNOSIS — D631 Anemia in chronic kidney disease: Secondary | ICD-10-CM | POA: Diagnosis not present

## 2012-12-06 DIAGNOSIS — N186 End stage renal disease: Secondary | ICD-10-CM | POA: Diagnosis not present

## 2012-12-08 DIAGNOSIS — N186 End stage renal disease: Secondary | ICD-10-CM | POA: Diagnosis not present

## 2012-12-08 DIAGNOSIS — N2581 Secondary hyperparathyroidism of renal origin: Secondary | ICD-10-CM | POA: Diagnosis not present

## 2012-12-08 DIAGNOSIS — D509 Iron deficiency anemia, unspecified: Secondary | ICD-10-CM | POA: Diagnosis not present

## 2012-12-08 DIAGNOSIS — D631 Anemia in chronic kidney disease: Secondary | ICD-10-CM | POA: Diagnosis not present

## 2012-12-11 DIAGNOSIS — D631 Anemia in chronic kidney disease: Secondary | ICD-10-CM | POA: Diagnosis not present

## 2012-12-11 DIAGNOSIS — N2581 Secondary hyperparathyroidism of renal origin: Secondary | ICD-10-CM | POA: Diagnosis not present

## 2012-12-11 DIAGNOSIS — N186 End stage renal disease: Secondary | ICD-10-CM | POA: Diagnosis not present

## 2012-12-11 DIAGNOSIS — D509 Iron deficiency anemia, unspecified: Secondary | ICD-10-CM | POA: Diagnosis not present

## 2012-12-13 DIAGNOSIS — N186 End stage renal disease: Secondary | ICD-10-CM | POA: Diagnosis not present

## 2012-12-13 DIAGNOSIS — D631 Anemia in chronic kidney disease: Secondary | ICD-10-CM | POA: Diagnosis not present

## 2012-12-13 DIAGNOSIS — N2581 Secondary hyperparathyroidism of renal origin: Secondary | ICD-10-CM | POA: Diagnosis not present

## 2012-12-13 DIAGNOSIS — D509 Iron deficiency anemia, unspecified: Secondary | ICD-10-CM | POA: Diagnosis not present

## 2012-12-15 DIAGNOSIS — D509 Iron deficiency anemia, unspecified: Secondary | ICD-10-CM | POA: Diagnosis not present

## 2012-12-15 DIAGNOSIS — N186 End stage renal disease: Secondary | ICD-10-CM | POA: Diagnosis not present

## 2012-12-15 DIAGNOSIS — N2581 Secondary hyperparathyroidism of renal origin: Secondary | ICD-10-CM | POA: Diagnosis not present

## 2012-12-15 DIAGNOSIS — D631 Anemia in chronic kidney disease: Secondary | ICD-10-CM | POA: Diagnosis not present

## 2012-12-18 DIAGNOSIS — N186 End stage renal disease: Secondary | ICD-10-CM | POA: Diagnosis not present

## 2012-12-18 DIAGNOSIS — D509 Iron deficiency anemia, unspecified: Secondary | ICD-10-CM | POA: Diagnosis not present

## 2012-12-18 DIAGNOSIS — D631 Anemia in chronic kidney disease: Secondary | ICD-10-CM | POA: Diagnosis not present

## 2012-12-18 DIAGNOSIS — N2581 Secondary hyperparathyroidism of renal origin: Secondary | ICD-10-CM | POA: Diagnosis not present

## 2012-12-20 DIAGNOSIS — D509 Iron deficiency anemia, unspecified: Secondary | ICD-10-CM | POA: Diagnosis not present

## 2012-12-20 DIAGNOSIS — N186 End stage renal disease: Secondary | ICD-10-CM | POA: Diagnosis not present

## 2012-12-20 DIAGNOSIS — N2581 Secondary hyperparathyroidism of renal origin: Secondary | ICD-10-CM | POA: Diagnosis not present

## 2012-12-20 DIAGNOSIS — D631 Anemia in chronic kidney disease: Secondary | ICD-10-CM | POA: Diagnosis not present

## 2012-12-22 DIAGNOSIS — Z87891 Personal history of nicotine dependence: Secondary | ICD-10-CM | POA: Diagnosis not present

## 2012-12-22 DIAGNOSIS — D631 Anemia in chronic kidney disease: Secondary | ICD-10-CM | POA: Diagnosis not present

## 2012-12-22 DIAGNOSIS — N2581 Secondary hyperparathyroidism of renal origin: Secondary | ICD-10-CM | POA: Diagnosis not present

## 2012-12-22 DIAGNOSIS — Z940 Kidney transplant status: Secondary | ICD-10-CM | POA: Diagnosis not present

## 2012-12-22 DIAGNOSIS — Z01810 Encounter for preprocedural cardiovascular examination: Secondary | ICD-10-CM | POA: Diagnosis not present

## 2012-12-22 DIAGNOSIS — I120 Hypertensive chronic kidney disease with stage 5 chronic kidney disease or end stage renal disease: Secondary | ICD-10-CM | POA: Diagnosis present

## 2012-12-22 DIAGNOSIS — K859 Acute pancreatitis without necrosis or infection, unspecified: Secondary | ICD-10-CM | POA: Diagnosis present

## 2012-12-22 DIAGNOSIS — Z885 Allergy status to narcotic agent status: Secondary | ICD-10-CM | POA: Diagnosis not present

## 2012-12-22 DIAGNOSIS — N059 Unspecified nephritic syndrome with unspecified morphologic changes: Secondary | ICD-10-CM | POA: Diagnosis not present

## 2012-12-22 DIAGNOSIS — D649 Anemia, unspecified: Secondary | ICD-10-CM | POA: Diagnosis not present

## 2012-12-22 DIAGNOSIS — E871 Hypo-osmolality and hyponatremia: Secondary | ICD-10-CM | POA: Diagnosis present

## 2012-12-22 DIAGNOSIS — Z79899 Other long term (current) drug therapy: Secondary | ICD-10-CM | POA: Diagnosis not present

## 2012-12-22 DIAGNOSIS — T8610 Unspecified complication of kidney transplant: Secondary | ICD-10-CM | POA: Diagnosis not present

## 2012-12-22 DIAGNOSIS — D509 Iron deficiency anemia, unspecified: Secondary | ICD-10-CM | POA: Diagnosis not present

## 2012-12-22 DIAGNOSIS — I10 Essential (primary) hypertension: Secondary | ICD-10-CM | POA: Diagnosis not present

## 2012-12-22 DIAGNOSIS — N186 End stage renal disease: Secondary | ICD-10-CM | POA: Diagnosis present

## 2012-12-22 DIAGNOSIS — Z992 Dependence on renal dialysis: Secondary | ICD-10-CM | POA: Diagnosis not present

## 2012-12-25 DIAGNOSIS — D509 Iron deficiency anemia, unspecified: Secondary | ICD-10-CM | POA: Diagnosis not present

## 2012-12-25 DIAGNOSIS — N186 End stage renal disease: Secondary | ICD-10-CM | POA: Diagnosis not present

## 2012-12-25 DIAGNOSIS — N2581 Secondary hyperparathyroidism of renal origin: Secondary | ICD-10-CM | POA: Diagnosis not present

## 2012-12-25 DIAGNOSIS — D631 Anemia in chronic kidney disease: Secondary | ICD-10-CM | POA: Diagnosis not present

## 2012-12-27 DIAGNOSIS — D631 Anemia in chronic kidney disease: Secondary | ICD-10-CM | POA: Diagnosis not present

## 2012-12-27 DIAGNOSIS — N2581 Secondary hyperparathyroidism of renal origin: Secondary | ICD-10-CM | POA: Diagnosis not present

## 2012-12-27 DIAGNOSIS — N186 End stage renal disease: Secondary | ICD-10-CM | POA: Diagnosis not present

## 2012-12-27 DIAGNOSIS — D509 Iron deficiency anemia, unspecified: Secondary | ICD-10-CM | POA: Diagnosis not present

## 2012-12-29 DIAGNOSIS — D509 Iron deficiency anemia, unspecified: Secondary | ICD-10-CM | POA: Diagnosis not present

## 2012-12-29 DIAGNOSIS — D631 Anemia in chronic kidney disease: Secondary | ICD-10-CM | POA: Diagnosis not present

## 2012-12-29 DIAGNOSIS — N2581 Secondary hyperparathyroidism of renal origin: Secondary | ICD-10-CM | POA: Diagnosis not present

## 2012-12-29 DIAGNOSIS — N186 End stage renal disease: Secondary | ICD-10-CM | POA: Diagnosis not present

## 2013-01-01 DIAGNOSIS — N186 End stage renal disease: Secondary | ICD-10-CM | POA: Diagnosis not present

## 2013-01-01 DIAGNOSIS — N2581 Secondary hyperparathyroidism of renal origin: Secondary | ICD-10-CM | POA: Diagnosis not present

## 2013-01-01 DIAGNOSIS — D509 Iron deficiency anemia, unspecified: Secondary | ICD-10-CM | POA: Diagnosis not present

## 2013-01-01 DIAGNOSIS — D631 Anemia in chronic kidney disease: Secondary | ICD-10-CM | POA: Diagnosis not present

## 2013-01-03 DIAGNOSIS — N186 End stage renal disease: Secondary | ICD-10-CM | POA: Diagnosis not present

## 2013-01-03 DIAGNOSIS — D631 Anemia in chronic kidney disease: Secondary | ICD-10-CM | POA: Diagnosis not present

## 2013-01-03 DIAGNOSIS — D509 Iron deficiency anemia, unspecified: Secondary | ICD-10-CM | POA: Diagnosis not present

## 2013-01-03 DIAGNOSIS — N2581 Secondary hyperparathyroidism of renal origin: Secondary | ICD-10-CM | POA: Diagnosis not present

## 2013-01-05 DIAGNOSIS — D631 Anemia in chronic kidney disease: Secondary | ICD-10-CM | POA: Diagnosis not present

## 2013-01-05 DIAGNOSIS — D509 Iron deficiency anemia, unspecified: Secondary | ICD-10-CM | POA: Diagnosis not present

## 2013-01-05 DIAGNOSIS — N2581 Secondary hyperparathyroidism of renal origin: Secondary | ICD-10-CM | POA: Diagnosis not present

## 2013-01-05 DIAGNOSIS — N186 End stage renal disease: Secondary | ICD-10-CM | POA: Diagnosis not present

## 2013-01-07 DIAGNOSIS — D509 Iron deficiency anemia, unspecified: Secondary | ICD-10-CM | POA: Diagnosis not present

## 2013-01-07 DIAGNOSIS — N2581 Secondary hyperparathyroidism of renal origin: Secondary | ICD-10-CM | POA: Diagnosis not present

## 2013-01-07 DIAGNOSIS — N186 End stage renal disease: Secondary | ICD-10-CM | POA: Diagnosis not present

## 2013-01-07 DIAGNOSIS — D631 Anemia in chronic kidney disease: Secondary | ICD-10-CM | POA: Diagnosis not present

## 2013-01-09 DIAGNOSIS — D631 Anemia in chronic kidney disease: Secondary | ICD-10-CM | POA: Diagnosis not present

## 2013-01-09 DIAGNOSIS — N186 End stage renal disease: Secondary | ICD-10-CM | POA: Diagnosis not present

## 2013-01-09 DIAGNOSIS — D509 Iron deficiency anemia, unspecified: Secondary | ICD-10-CM | POA: Diagnosis not present

## 2013-01-09 DIAGNOSIS — N2581 Secondary hyperparathyroidism of renal origin: Secondary | ICD-10-CM | POA: Diagnosis not present

## 2013-01-12 DIAGNOSIS — N186 End stage renal disease: Secondary | ICD-10-CM | POA: Diagnosis not present

## 2013-01-12 DIAGNOSIS — D509 Iron deficiency anemia, unspecified: Secondary | ICD-10-CM | POA: Diagnosis not present

## 2013-01-12 DIAGNOSIS — N2581 Secondary hyperparathyroidism of renal origin: Secondary | ICD-10-CM | POA: Diagnosis not present

## 2013-01-12 DIAGNOSIS — D631 Anemia in chronic kidney disease: Secondary | ICD-10-CM | POA: Diagnosis not present

## 2013-01-14 DIAGNOSIS — D509 Iron deficiency anemia, unspecified: Secondary | ICD-10-CM | POA: Diagnosis not present

## 2013-01-14 DIAGNOSIS — N2581 Secondary hyperparathyroidism of renal origin: Secondary | ICD-10-CM | POA: Diagnosis not present

## 2013-01-14 DIAGNOSIS — D631 Anemia in chronic kidney disease: Secondary | ICD-10-CM | POA: Diagnosis not present

## 2013-01-14 DIAGNOSIS — N186 End stage renal disease: Secondary | ICD-10-CM | POA: Diagnosis not present

## 2013-01-16 DIAGNOSIS — N186 End stage renal disease: Secondary | ICD-10-CM | POA: Diagnosis not present

## 2013-01-16 DIAGNOSIS — N2581 Secondary hyperparathyroidism of renal origin: Secondary | ICD-10-CM | POA: Diagnosis not present

## 2013-01-16 DIAGNOSIS — D631 Anemia in chronic kidney disease: Secondary | ICD-10-CM | POA: Diagnosis not present

## 2013-01-16 DIAGNOSIS — D509 Iron deficiency anemia, unspecified: Secondary | ICD-10-CM | POA: Diagnosis not present

## 2013-01-19 DIAGNOSIS — N2581 Secondary hyperparathyroidism of renal origin: Secondary | ICD-10-CM | POA: Diagnosis not present

## 2013-01-19 DIAGNOSIS — N186 End stage renal disease: Secondary | ICD-10-CM | POA: Diagnosis not present

## 2013-01-19 DIAGNOSIS — N039 Chronic nephritic syndrome with unspecified morphologic changes: Secondary | ICD-10-CM | POA: Diagnosis not present

## 2013-01-19 DIAGNOSIS — Z992 Dependence on renal dialysis: Secondary | ICD-10-CM | POA: Diagnosis not present

## 2013-01-19 DIAGNOSIS — D509 Iron deficiency anemia, unspecified: Secondary | ICD-10-CM | POA: Diagnosis not present

## 2013-01-19 DIAGNOSIS — D631 Anemia in chronic kidney disease: Secondary | ICD-10-CM | POA: Diagnosis not present

## 2013-01-22 DIAGNOSIS — N186 End stage renal disease: Secondary | ICD-10-CM | POA: Diagnosis not present

## 2013-01-22 DIAGNOSIS — D509 Iron deficiency anemia, unspecified: Secondary | ICD-10-CM | POA: Diagnosis not present

## 2013-01-22 DIAGNOSIS — N039 Chronic nephritic syndrome with unspecified morphologic changes: Secondary | ICD-10-CM | POA: Diagnosis not present

## 2013-01-22 DIAGNOSIS — D631 Anemia in chronic kidney disease: Secondary | ICD-10-CM | POA: Diagnosis not present

## 2013-01-22 DIAGNOSIS — N2581 Secondary hyperparathyroidism of renal origin: Secondary | ICD-10-CM | POA: Diagnosis not present

## 2013-01-22 DIAGNOSIS — Z992 Dependence on renal dialysis: Secondary | ICD-10-CM | POA: Diagnosis not present

## 2013-01-24 DIAGNOSIS — N2581 Secondary hyperparathyroidism of renal origin: Secondary | ICD-10-CM | POA: Diagnosis not present

## 2013-01-24 DIAGNOSIS — D631 Anemia in chronic kidney disease: Secondary | ICD-10-CM | POA: Diagnosis not present

## 2013-01-24 DIAGNOSIS — D509 Iron deficiency anemia, unspecified: Secondary | ICD-10-CM | POA: Diagnosis not present

## 2013-01-24 DIAGNOSIS — N186 End stage renal disease: Secondary | ICD-10-CM | POA: Diagnosis not present

## 2013-01-24 DIAGNOSIS — Z992 Dependence on renal dialysis: Secondary | ICD-10-CM | POA: Diagnosis not present

## 2013-01-26 DIAGNOSIS — N2581 Secondary hyperparathyroidism of renal origin: Secondary | ICD-10-CM | POA: Diagnosis not present

## 2013-01-26 DIAGNOSIS — D631 Anemia in chronic kidney disease: Secondary | ICD-10-CM | POA: Diagnosis not present

## 2013-01-26 DIAGNOSIS — N186 End stage renal disease: Secondary | ICD-10-CM | POA: Diagnosis not present

## 2013-01-26 DIAGNOSIS — D509 Iron deficiency anemia, unspecified: Secondary | ICD-10-CM | POA: Diagnosis not present

## 2013-01-26 DIAGNOSIS — Z992 Dependence on renal dialysis: Secondary | ICD-10-CM | POA: Diagnosis not present

## 2013-01-29 DIAGNOSIS — Z992 Dependence on renal dialysis: Secondary | ICD-10-CM | POA: Diagnosis not present

## 2013-01-29 DIAGNOSIS — D509 Iron deficiency anemia, unspecified: Secondary | ICD-10-CM | POA: Diagnosis not present

## 2013-01-29 DIAGNOSIS — N2581 Secondary hyperparathyroidism of renal origin: Secondary | ICD-10-CM | POA: Diagnosis not present

## 2013-01-29 DIAGNOSIS — D631 Anemia in chronic kidney disease: Secondary | ICD-10-CM | POA: Diagnosis not present

## 2013-01-29 DIAGNOSIS — N039 Chronic nephritic syndrome with unspecified morphologic changes: Secondary | ICD-10-CM | POA: Diagnosis not present

## 2013-01-29 DIAGNOSIS — N186 End stage renal disease: Secondary | ICD-10-CM | POA: Diagnosis not present

## 2013-01-31 DIAGNOSIS — D509 Iron deficiency anemia, unspecified: Secondary | ICD-10-CM | POA: Diagnosis not present

## 2013-01-31 DIAGNOSIS — D631 Anemia in chronic kidney disease: Secondary | ICD-10-CM | POA: Diagnosis not present

## 2013-01-31 DIAGNOSIS — Z992 Dependence on renal dialysis: Secondary | ICD-10-CM | POA: Diagnosis not present

## 2013-01-31 DIAGNOSIS — N2581 Secondary hyperparathyroidism of renal origin: Secondary | ICD-10-CM | POA: Diagnosis not present

## 2013-01-31 DIAGNOSIS — N186 End stage renal disease: Secondary | ICD-10-CM | POA: Diagnosis not present

## 2013-02-02 DIAGNOSIS — D631 Anemia in chronic kidney disease: Secondary | ICD-10-CM | POA: Diagnosis not present

## 2013-02-02 DIAGNOSIS — D509 Iron deficiency anemia, unspecified: Secondary | ICD-10-CM | POA: Diagnosis not present

## 2013-02-02 DIAGNOSIS — Z992 Dependence on renal dialysis: Secondary | ICD-10-CM | POA: Diagnosis not present

## 2013-02-02 DIAGNOSIS — N186 End stage renal disease: Secondary | ICD-10-CM | POA: Diagnosis not present

## 2013-02-02 DIAGNOSIS — N2581 Secondary hyperparathyroidism of renal origin: Secondary | ICD-10-CM | POA: Diagnosis not present

## 2013-02-05 DIAGNOSIS — D509 Iron deficiency anemia, unspecified: Secondary | ICD-10-CM | POA: Diagnosis not present

## 2013-02-05 DIAGNOSIS — Z992 Dependence on renal dialysis: Secondary | ICD-10-CM | POA: Diagnosis not present

## 2013-02-05 DIAGNOSIS — D631 Anemia in chronic kidney disease: Secondary | ICD-10-CM | POA: Diagnosis not present

## 2013-02-05 DIAGNOSIS — N186 End stage renal disease: Secondary | ICD-10-CM | POA: Diagnosis not present

## 2013-02-05 DIAGNOSIS — N2581 Secondary hyperparathyroidism of renal origin: Secondary | ICD-10-CM | POA: Diagnosis not present

## 2013-02-07 DIAGNOSIS — Z992 Dependence on renal dialysis: Secondary | ICD-10-CM | POA: Diagnosis not present

## 2013-02-07 DIAGNOSIS — D509 Iron deficiency anemia, unspecified: Secondary | ICD-10-CM | POA: Diagnosis not present

## 2013-02-07 DIAGNOSIS — D631 Anemia in chronic kidney disease: Secondary | ICD-10-CM | POA: Diagnosis not present

## 2013-02-07 DIAGNOSIS — N186 End stage renal disease: Secondary | ICD-10-CM | POA: Diagnosis not present

## 2013-02-07 DIAGNOSIS — N2581 Secondary hyperparathyroidism of renal origin: Secondary | ICD-10-CM | POA: Diagnosis not present

## 2013-02-07 DIAGNOSIS — N039 Chronic nephritic syndrome with unspecified morphologic changes: Secondary | ICD-10-CM | POA: Diagnosis not present

## 2013-02-09 DIAGNOSIS — N186 End stage renal disease: Secondary | ICD-10-CM | POA: Diagnosis not present

## 2013-02-09 DIAGNOSIS — Z992 Dependence on renal dialysis: Secondary | ICD-10-CM | POA: Diagnosis not present

## 2013-02-09 DIAGNOSIS — N2581 Secondary hyperparathyroidism of renal origin: Secondary | ICD-10-CM | POA: Diagnosis not present

## 2013-02-09 DIAGNOSIS — D509 Iron deficiency anemia, unspecified: Secondary | ICD-10-CM | POA: Diagnosis not present

## 2013-02-09 DIAGNOSIS — D631 Anemia in chronic kidney disease: Secondary | ICD-10-CM | POA: Diagnosis not present

## 2013-02-09 DIAGNOSIS — N039 Chronic nephritic syndrome with unspecified morphologic changes: Secondary | ICD-10-CM | POA: Diagnosis not present

## 2013-02-11 ENCOUNTER — Other Ambulatory Visit: Payer: Self-pay

## 2013-02-11 DIAGNOSIS — Z1231 Encounter for screening mammogram for malignant neoplasm of breast: Secondary | ICD-10-CM

## 2013-02-12 DIAGNOSIS — D509 Iron deficiency anemia, unspecified: Secondary | ICD-10-CM | POA: Diagnosis not present

## 2013-02-12 DIAGNOSIS — N2581 Secondary hyperparathyroidism of renal origin: Secondary | ICD-10-CM | POA: Diagnosis not present

## 2013-02-12 DIAGNOSIS — N186 End stage renal disease: Secondary | ICD-10-CM | POA: Diagnosis not present

## 2013-02-12 DIAGNOSIS — D631 Anemia in chronic kidney disease: Secondary | ICD-10-CM | POA: Diagnosis not present

## 2013-02-12 DIAGNOSIS — Z992 Dependence on renal dialysis: Secondary | ICD-10-CM | POA: Diagnosis not present

## 2013-02-14 ENCOUNTER — Encounter: Payer: Self-pay | Admitting: Internal Medicine

## 2013-02-14 DIAGNOSIS — N186 End stage renal disease: Secondary | ICD-10-CM | POA: Diagnosis not present

## 2013-02-14 DIAGNOSIS — D509 Iron deficiency anemia, unspecified: Secondary | ICD-10-CM | POA: Diagnosis not present

## 2013-02-14 DIAGNOSIS — Z992 Dependence on renal dialysis: Secondary | ICD-10-CM | POA: Diagnosis not present

## 2013-02-14 DIAGNOSIS — N2581 Secondary hyperparathyroidism of renal origin: Secondary | ICD-10-CM | POA: Diagnosis not present

## 2013-02-14 DIAGNOSIS — D631 Anemia in chronic kidney disease: Secondary | ICD-10-CM | POA: Diagnosis not present

## 2013-02-15 DIAGNOSIS — Z01811 Encounter for preprocedural respiratory examination: Secondary | ICD-10-CM | POA: Diagnosis not present

## 2013-02-15 DIAGNOSIS — Z01810 Encounter for preprocedural cardiovascular examination: Secondary | ICD-10-CM | POA: Diagnosis not present

## 2013-02-16 DIAGNOSIS — D631 Anemia in chronic kidney disease: Secondary | ICD-10-CM | POA: Diagnosis not present

## 2013-02-16 DIAGNOSIS — N186 End stage renal disease: Secondary | ICD-10-CM | POA: Diagnosis not present

## 2013-02-16 DIAGNOSIS — Z992 Dependence on renal dialysis: Secondary | ICD-10-CM | POA: Diagnosis not present

## 2013-02-16 DIAGNOSIS — N2581 Secondary hyperparathyroidism of renal origin: Secondary | ICD-10-CM | POA: Diagnosis not present

## 2013-02-16 DIAGNOSIS — D509 Iron deficiency anemia, unspecified: Secondary | ICD-10-CM | POA: Diagnosis not present

## 2013-02-19 DIAGNOSIS — N2581 Secondary hyperparathyroidism of renal origin: Secondary | ICD-10-CM | POA: Diagnosis not present

## 2013-02-19 DIAGNOSIS — N186 End stage renal disease: Secondary | ICD-10-CM | POA: Diagnosis not present

## 2013-02-19 DIAGNOSIS — D631 Anemia in chronic kidney disease: Secondary | ICD-10-CM | POA: Diagnosis not present

## 2013-02-19 DIAGNOSIS — D509 Iron deficiency anemia, unspecified: Secondary | ICD-10-CM | POA: Diagnosis not present

## 2013-02-20 DIAGNOSIS — Z124 Encounter for screening for malignant neoplasm of cervix: Secondary | ICD-10-CM | POA: Diagnosis not present

## 2013-02-20 DIAGNOSIS — Z01419 Encounter for gynecological examination (general) (routine) without abnormal findings: Secondary | ICD-10-CM | POA: Diagnosis not present

## 2013-02-21 DIAGNOSIS — N2581 Secondary hyperparathyroidism of renal origin: Secondary | ICD-10-CM | POA: Diagnosis not present

## 2013-02-21 DIAGNOSIS — D631 Anemia in chronic kidney disease: Secondary | ICD-10-CM | POA: Diagnosis not present

## 2013-02-21 DIAGNOSIS — N186 End stage renal disease: Secondary | ICD-10-CM | POA: Diagnosis not present

## 2013-02-21 DIAGNOSIS — D509 Iron deficiency anemia, unspecified: Secondary | ICD-10-CM | POA: Diagnosis not present

## 2013-02-23 DIAGNOSIS — N186 End stage renal disease: Secondary | ICD-10-CM | POA: Diagnosis not present

## 2013-02-23 DIAGNOSIS — D509 Iron deficiency anemia, unspecified: Secondary | ICD-10-CM | POA: Diagnosis not present

## 2013-02-23 DIAGNOSIS — N2581 Secondary hyperparathyroidism of renal origin: Secondary | ICD-10-CM | POA: Diagnosis not present

## 2013-02-23 DIAGNOSIS — D631 Anemia in chronic kidney disease: Secondary | ICD-10-CM | POA: Diagnosis not present

## 2013-02-26 DIAGNOSIS — D509 Iron deficiency anemia, unspecified: Secondary | ICD-10-CM | POA: Diagnosis not present

## 2013-02-26 DIAGNOSIS — N186 End stage renal disease: Secondary | ICD-10-CM | POA: Diagnosis not present

## 2013-02-26 DIAGNOSIS — D631 Anemia in chronic kidney disease: Secondary | ICD-10-CM | POA: Diagnosis not present

## 2013-02-26 DIAGNOSIS — N2581 Secondary hyperparathyroidism of renal origin: Secondary | ICD-10-CM | POA: Diagnosis not present

## 2013-02-26 DIAGNOSIS — N039 Chronic nephritic syndrome with unspecified morphologic changes: Secondary | ICD-10-CM | POA: Diagnosis not present

## 2013-02-27 ENCOUNTER — Ambulatory Visit
Admission: RE | Admit: 2013-02-27 | Discharge: 2013-02-27 | Disposition: A | Discharge status: Home / Self Care | Payer: Medicare Other | Source: Ambulatory Visit

## 2013-02-27 DIAGNOSIS — Z1231 Encounter for screening mammogram for malignant neoplasm of breast: Secondary | ICD-10-CM | POA: Diagnosis not present

## 2013-02-28 DIAGNOSIS — D631 Anemia in chronic kidney disease: Secondary | ICD-10-CM | POA: Diagnosis not present

## 2013-02-28 DIAGNOSIS — N2581 Secondary hyperparathyroidism of renal origin: Secondary | ICD-10-CM | POA: Diagnosis not present

## 2013-02-28 DIAGNOSIS — N039 Chronic nephritic syndrome with unspecified morphologic changes: Secondary | ICD-10-CM | POA: Diagnosis not present

## 2013-02-28 DIAGNOSIS — N186 End stage renal disease: Secondary | ICD-10-CM | POA: Diagnosis not present

## 2013-02-28 DIAGNOSIS — D509 Iron deficiency anemia, unspecified: Secondary | ICD-10-CM | POA: Diagnosis not present

## 2013-03-02 DIAGNOSIS — N2581 Secondary hyperparathyroidism of renal origin: Secondary | ICD-10-CM | POA: Diagnosis not present

## 2013-03-02 DIAGNOSIS — D631 Anemia in chronic kidney disease: Secondary | ICD-10-CM | POA: Diagnosis not present

## 2013-03-02 DIAGNOSIS — D509 Iron deficiency anemia, unspecified: Secondary | ICD-10-CM | POA: Diagnosis not present

## 2013-03-02 DIAGNOSIS — N186 End stage renal disease: Secondary | ICD-10-CM | POA: Diagnosis not present

## 2013-03-05 DIAGNOSIS — D509 Iron deficiency anemia, unspecified: Secondary | ICD-10-CM | POA: Diagnosis not present

## 2013-03-05 DIAGNOSIS — N2581 Secondary hyperparathyroidism of renal origin: Secondary | ICD-10-CM | POA: Diagnosis not present

## 2013-03-05 DIAGNOSIS — N186 End stage renal disease: Secondary | ICD-10-CM | POA: Diagnosis not present

## 2013-03-05 DIAGNOSIS — D631 Anemia in chronic kidney disease: Secondary | ICD-10-CM | POA: Diagnosis not present

## 2013-03-07 DIAGNOSIS — D631 Anemia in chronic kidney disease: Secondary | ICD-10-CM | POA: Diagnosis not present

## 2013-03-07 DIAGNOSIS — N186 End stage renal disease: Secondary | ICD-10-CM | POA: Diagnosis not present

## 2013-03-07 DIAGNOSIS — N2581 Secondary hyperparathyroidism of renal origin: Secondary | ICD-10-CM | POA: Diagnosis not present

## 2013-03-07 DIAGNOSIS — D509 Iron deficiency anemia, unspecified: Secondary | ICD-10-CM | POA: Diagnosis not present

## 2013-03-07 DIAGNOSIS — N039 Chronic nephritic syndrome with unspecified morphologic changes: Secondary | ICD-10-CM | POA: Diagnosis not present

## 2013-03-09 DIAGNOSIS — D631 Anemia in chronic kidney disease: Secondary | ICD-10-CM | POA: Diagnosis not present

## 2013-03-09 DIAGNOSIS — N186 End stage renal disease: Secondary | ICD-10-CM | POA: Diagnosis not present

## 2013-03-09 DIAGNOSIS — N2581 Secondary hyperparathyroidism of renal origin: Secondary | ICD-10-CM | POA: Diagnosis not present

## 2013-03-09 DIAGNOSIS — D509 Iron deficiency anemia, unspecified: Secondary | ICD-10-CM | POA: Diagnosis not present

## 2013-03-12 DIAGNOSIS — D631 Anemia in chronic kidney disease: Secondary | ICD-10-CM | POA: Diagnosis not present

## 2013-03-12 DIAGNOSIS — D509 Iron deficiency anemia, unspecified: Secondary | ICD-10-CM | POA: Diagnosis not present

## 2013-03-12 DIAGNOSIS — N186 End stage renal disease: Secondary | ICD-10-CM | POA: Diagnosis not present

## 2013-03-12 DIAGNOSIS — N2581 Secondary hyperparathyroidism of renal origin: Secondary | ICD-10-CM | POA: Diagnosis not present

## 2013-03-14 DIAGNOSIS — D509 Iron deficiency anemia, unspecified: Secondary | ICD-10-CM | POA: Diagnosis not present

## 2013-03-14 DIAGNOSIS — N186 End stage renal disease: Secondary | ICD-10-CM | POA: Diagnosis not present

## 2013-03-14 DIAGNOSIS — D631 Anemia in chronic kidney disease: Secondary | ICD-10-CM | POA: Diagnosis not present

## 2013-03-14 DIAGNOSIS — N2581 Secondary hyperparathyroidism of renal origin: Secondary | ICD-10-CM | POA: Diagnosis not present

## 2013-03-16 DIAGNOSIS — D631 Anemia in chronic kidney disease: Secondary | ICD-10-CM | POA: Diagnosis not present

## 2013-03-16 DIAGNOSIS — D509 Iron deficiency anemia, unspecified: Secondary | ICD-10-CM | POA: Diagnosis not present

## 2013-03-16 DIAGNOSIS — N2581 Secondary hyperparathyroidism of renal origin: Secondary | ICD-10-CM | POA: Diagnosis not present

## 2013-03-16 DIAGNOSIS — N186 End stage renal disease: Secondary | ICD-10-CM | POA: Diagnosis not present

## 2013-03-19 DIAGNOSIS — N2581 Secondary hyperparathyroidism of renal origin: Secondary | ICD-10-CM | POA: Diagnosis not present

## 2013-03-19 DIAGNOSIS — D509 Iron deficiency anemia, unspecified: Secondary | ICD-10-CM | POA: Diagnosis not present

## 2013-03-19 DIAGNOSIS — N186 End stage renal disease: Secondary | ICD-10-CM | POA: Diagnosis not present

## 2013-03-19 DIAGNOSIS — D631 Anemia in chronic kidney disease: Secondary | ICD-10-CM | POA: Diagnosis not present

## 2013-03-20 DIAGNOSIS — N950 Postmenopausal bleeding: Secondary | ICD-10-CM | POA: Diagnosis not present

## 2013-03-20 DIAGNOSIS — R9389 Abnormal findings on diagnostic imaging of other specified body structures: Secondary | ICD-10-CM | POA: Diagnosis not present

## 2013-03-22 DIAGNOSIS — R9389 Abnormal findings on diagnostic imaging of other specified body structures: Secondary | ICD-10-CM | POA: Diagnosis not present

## 2013-03-22 DIAGNOSIS — N950 Postmenopausal bleeding: Secondary | ICD-10-CM | POA: Diagnosis not present

## 2013-03-25 ENCOUNTER — Ambulatory Visit (INDEPENDENT_AMBULATORY_CARE_PROVIDER_SITE_OTHER): Payer: Medicare Other | Admitting: Internal Medicine

## 2013-03-25 ENCOUNTER — Encounter: Payer: Self-pay | Admitting: Internal Medicine

## 2013-03-25 VITALS — BP 130/70 | HR 84 | Ht 67.25 in | Wt 144.4 lb

## 2013-03-25 DIAGNOSIS — N186 End stage renal disease: Secondary | ICD-10-CM

## 2013-03-25 DIAGNOSIS — K5730 Diverticulosis of large intestine without perforation or abscess without bleeding: Secondary | ICD-10-CM

## 2013-03-25 DIAGNOSIS — Z1211 Encounter for screening for malignant neoplasm of colon: Secondary | ICD-10-CM

## 2013-03-25 DIAGNOSIS — Z992 Dependence on renal dialysis: Secondary | ICD-10-CM

## 2013-03-25 MED ORDER — MOVIPREP 100 G PO SOLR: 1.0000 | Freq: Once | ORAL | Status: DC

## 2013-03-25 NOTE — Progress Notes (Signed)
HISTORY OF PRESENT ILLNESS:  [name] is a 53 y.o. female with hypertension and end-stage renal disease on hemodialysis 3 times weekly. Prior kidney transplant with subsequent rejection remotely. She sent today regarding screening colonoscopy in anticipation of kidney transplant. Patient has a remote history of idiopathic pancreatitis. No problems in years. Last underwent colonoscopy in June of 2004. This revealed diverticulosis. Following year developed problems with abdominal pain and abnormal imaging. Normal EGD December 2005. Unremarkable capsule endoscopy March 2006. Has not been seen since. Current GI review of systems is negative.  REVIEW OF SYSTEMS:  All non-GI ROS negative except for tendinitis in right wrist  Past Medical History  Diagnosis Date  . HTN (hypertension)   . Pancreatitis   . ESRD (end stage renal disease)   . Glomerulonephritis   . Dialysis patient     Past Surgical History  Procedure Laterality Date  . Kidney transplant    . Dialysis fistula creation Right     Social History [name]  reports that she has been smoking Cigarettes.  She has a 5 pack-year smoking history. She has never used smokeless tobacco. She reports that she does not drink alcohol or use illicit drugs.  family history includes Diabetes in her maternal aunt and maternal uncle.  Allergies  Allergen Reactions  . Ace Inhibitors   . Codeine   . Penicillins        PHYSICAL EXAMINATION: Vital signs: BP 130/70  Pulse 84  Ht 5' 7.25" (1.708 m)  Wt 144 lb 6 oz (65.488 kg)  BMI 22.45 kg/m2 General: Well-developed, well-nourished, no acute distress HEENT: Sclerae are anicteric, conjunctiva pink. Oral mucosa intact Lungs: Clear Heart: Regular Abdomen: soft, nontender, nondistended, no obvious ascites, no peritoneal signs, normal bowel sounds. No organomegaly. Rectal: Deferred until colonoscopy Extremities: No edema. Right wrist splint Psychiatric: alert and oriented x3.  Cooperative    ASSESSMENT:  #1. Screening colonoscopy. Appropriate candidate without contraindication #2. Colonoscopy 2004 with diverticulosis #3. Remote history of idiopathic pancreatitis. No recurrence #4. End-stage renal disease on hemodialysis. Anticipating kidney transplant reevaluation     PLAN:  #1. Screening colonoscopy. Nondialysis today. Movi prep prescribed. Patient instructed on its use.The nature of the procedure, as well as the risks, benefits, and alternatives were carefully and thoroughly reviewed with the patient. Ample time for discussion and questions allowed. The patient understood, was satisfied, and agreed to proceed.

## 2013-03-25 NOTE — Patient Instructions (Addendum)
You have been given a separate informational sheet regarding your tobacco use, the importance of quitting and local resources to help you quit. You have been scheduled for a colonoscopy with propofol. Please follow written instructions given to you at your visit today.  Please pick up your prep kit at the pharmacy within the next 1-3 days. If you use inhalers (even only as needed), please bring them with you on the day of your procedure. Your physician has requested that you go to www.startemmi.com and enter the access code given to you at your visit today. This web site gives a general overview about your procedure. However, you should still follow specific instructions given to you by our office regarding your preparation for the procedure. 

## 2013-04-09 ENCOUNTER — Other Ambulatory Visit: Payer: Self-pay | Admitting: Obstetrics and Gynecology

## 2013-04-09 NOTE — Patient Instructions (Addendum)
Your procedure is scheduled on: Wednesday, April 17, 2013  Enter through the [employer] of [facility] at: 6:00 AM  Pick up the phone at the desk and dial [phone].  Call this number if you have problems the morning of surgery: [phone].  Remember: Do NOT eat food: AFTER MIDNIGHT TUESDAY Do NOT drink clear liquids after: AFTER MIDNIGHT TUESDAY Take these medicines the morning of surgery with a SIP OF WATER: XANAX AS NEEDED  Do NOT wear jewelry (body piercing), metal hair clips/bobby pins, make-up, or nail polish. Do NOT wear lotions, powders, or perfumes.  You may wear deoderant. Do NOT shave for 48 hours prior to surgery. Do NOT bring valuables to the hospital. Contacts, dentures, or bridgework may not be worn into surgery. Have a responsible adult drive you home and stay with you for 24 hours after your procedure

## 2013-04-10 ENCOUNTER — Encounter (HOSPITAL_COMMUNITY)
Admission: RE | Admit: 2013-04-10 | Discharge: 2013-04-10 | Disposition: A | Discharge status: Home / Self Care | Payer: Medicare Other | Source: Ambulatory Visit | Attending: Obstetrics and Gynecology | Admitting: Obstetrics and Gynecology

## 2013-04-10 ENCOUNTER — Encounter (HOSPITAL_COMMUNITY): Payer: Self-pay | Admitting: Pharmacist

## 2013-04-10 ENCOUNTER — Encounter (HOSPITAL_COMMUNITY): Payer: Self-pay

## 2013-04-10 DIAGNOSIS — Z01812 Encounter for preprocedural laboratory examination: Secondary | ICD-10-CM | POA: Diagnosis not present

## 2013-04-10 HISTORY — DX: Anxiety disorder, unspecified: F41.9

## 2013-04-10 HISTORY — DX: Anemia, unspecified: D64.9

## 2013-04-10 LAB — CBC
HEMATOCRIT: 34 % — AB (ref 36.0–46.0)
Hemoglobin: 11.4 g/dL — ABNORMAL LOW (ref 12.0–15.0)
MCH: 30.5 pg (ref 26.0–34.0)
MCHC: 33.5 g/dL (ref 30.0–36.0)
MCV: 90.9 fL (ref 78.0–100.0)
Platelets: 187 10*3/uL (ref 150–400)
RBC: 3.74 MIL/uL — ABNORMAL LOW (ref 3.87–5.11)
RDW: 13.1 % (ref 11.5–15.5)
WBC: 4.8 10*3/uL (ref 4.0–10.5)

## 2013-04-10 LAB — BASIC METABOLIC PANEL
BUN: 34 mg/dL — AB (ref 6–23)
CALCIUM: 10.3 mg/dL (ref 8.4–10.5)
CO2: 27 mEq/L (ref 19–32)
Chloride: 91 mEq/L — ABNORMAL LOW (ref 96–112)
Creatinine, Ser: 8.83 mg/dL — ABNORMAL HIGH (ref 0.50–1.10)
GFR calc non Af Amer: 5 mL/min — ABNORMAL LOW (ref >90)
GFR, EST AFRICAN AMERICAN: 5 mL/min — AB (ref >90)
Glucose, Bld: 74 mg/dL (ref 70–99)
Potassium: 4.1 mEq/L (ref 3.7–5.3)
Sodium: 134 mEq/L — ABNORMAL LOW (ref 137–147)

## 2013-04-16 DIAGNOSIS — N186 End stage renal disease: Secondary | ICD-10-CM | POA: Diagnosis not present

## 2013-04-17 ENCOUNTER — Ambulatory Visit (HOSPITAL_COMMUNITY): Payer: Medicare Other | Admitting: Anesthesiology

## 2013-04-17 ENCOUNTER — Encounter (HOSPITAL_COMMUNITY)
Admission: RE | Disposition: A | Discharge status: Home / Self Care | Payer: Self-pay | Source: Ambulatory Visit | Attending: Obstetrics and Gynecology

## 2013-04-17 ENCOUNTER — Ambulatory Visit (HOSPITAL_COMMUNITY)
Admission: RE | Admit: 2013-04-17 | Discharge: 2013-04-17 | Disposition: A | Discharge status: Home / Self Care | Payer: Medicare Other | Source: Ambulatory Visit | Attending: Obstetrics and Gynecology | Admitting: Obstetrics and Gynecology

## 2013-04-17 ENCOUNTER — Encounter (HOSPITAL_COMMUNITY): Payer: Medicare Other | Admitting: Anesthesiology

## 2013-04-17 ENCOUNTER — Encounter (HOSPITAL_COMMUNITY): Payer: Self-pay | Admitting: *Deleted

## 2013-04-17 DIAGNOSIS — K859 Acute pancreatitis without necrosis or infection, unspecified: Secondary | ICD-10-CM | POA: Diagnosis not present

## 2013-04-17 DIAGNOSIS — N186 End stage renal disease: Secondary | ICD-10-CM | POA: Diagnosis not present

## 2013-04-17 DIAGNOSIS — D250 Submucous leiomyoma of uterus: Secondary | ICD-10-CM | POA: Insufficient documentation

## 2013-04-17 DIAGNOSIS — I10 Essential (primary) hypertension: Secondary | ICD-10-CM | POA: Diagnosis not present

## 2013-04-17 DIAGNOSIS — D649 Anemia, unspecified: Secondary | ICD-10-CM | POA: Insufficient documentation

## 2013-04-17 DIAGNOSIS — N840 Polyp of corpus uteri: Secondary | ICD-10-CM

## 2013-04-17 DIAGNOSIS — I120 Hypertensive chronic kidney disease with stage 5 chronic kidney disease or end stage renal disease: Secondary | ICD-10-CM | POA: Diagnosis not present

## 2013-04-17 DIAGNOSIS — R9389 Abnormal findings on diagnostic imaging of other specified body structures: Secondary | ICD-10-CM | POA: Diagnosis not present

## 2013-04-17 DIAGNOSIS — F411 Generalized anxiety disorder: Secondary | ICD-10-CM | POA: Diagnosis not present

## 2013-04-17 DIAGNOSIS — N8500 Endometrial hyperplasia, unspecified: Secondary | ICD-10-CM | POA: Diagnosis not present

## 2013-04-17 DIAGNOSIS — F17200 Nicotine dependence, unspecified, uncomplicated: Secondary | ICD-10-CM | POA: Insufficient documentation

## 2013-04-17 DIAGNOSIS — N950 Postmenopausal bleeding: Secondary | ICD-10-CM | POA: Diagnosis not present

## 2013-04-17 HISTORY — PX: DILATATION & CURRETTAGE/HYSTEROSCOPY WITH RESECTOCOPE: SHX5572

## 2013-04-17 LAB — BASIC METABOLIC PANEL
BUN: 27 mg/dL — ABNORMAL HIGH (ref 6–23)
CHLORIDE: 92 meq/L — AB (ref 96–112)
CO2: 28 meq/L (ref 19–32)
CREATININE: 8.56 mg/dL — AB (ref 0.50–1.10)
Calcium: 10.2 mg/dL (ref 8.4–10.5)
GFR calc Af Amer: 6 mL/min — ABNORMAL LOW (ref >90)
GFR calc non Af Amer: 5 mL/min — ABNORMAL LOW (ref >90)
Glucose, Bld: 80 mg/dL (ref 70–99)
Potassium: 3.9 mEq/L (ref 3.7–5.3)
SODIUM: 136 meq/L — AB (ref 137–147)

## 2013-04-17 LAB — CBC
HEMATOCRIT: 31.6 % — AB (ref 36.0–46.0)
Hemoglobin: 10.8 g/dL — ABNORMAL LOW (ref 12.0–15.0)
MCH: 31 pg (ref 26.0–34.0)
MCHC: 34.2 g/dL (ref 30.0–36.0)
MCV: 90.8 fL (ref 78.0–100.0)
Platelets: 178 10*3/uL (ref 150–400)
RBC: 3.48 MIL/uL — AB (ref 3.87–5.11)
RDW: 13.1 % (ref 11.5–15.5)
WBC: 5.5 10*3/uL (ref 4.0–10.5)

## 2013-04-17 SURGERY — DILATATION & CURETTAGE/HYSTEROSCOPY WITH RESECTOCOPE
Anesthesia: General | Site: Vagina

## 2013-04-17 MED ORDER — DEXAMETHASONE SODIUM PHOSPHATE 10 MG/ML IJ SOLN
INTRAMUSCULAR | Status: AC
  Filled 2013-04-17: qty 1

## 2013-04-17 MED ORDER — SODIUM CHLORIDE 0.9 % IV SOLN
INTRAVENOUS | Status: DC
  Administered 2013-04-17 (×2): via INTRAVENOUS

## 2013-04-17 MED ORDER — DEXAMETHASONE SODIUM PHOSPHATE 10 MG/ML IJ SOLN
INTRAMUSCULAR | Status: DC | PRN
  Administered 2013-04-17: 5 mg via INTRAVENOUS

## 2013-04-17 MED ORDER — FENTANYL CITRATE 0.05 MG/ML IJ SOLN
INTRAMUSCULAR | Status: DC | PRN
  Administered 2013-04-17 (×2): 25 ug via INTRAVENOUS
  Administered 2013-04-17: 50 ug via INTRAVENOUS

## 2013-04-17 MED ORDER — ACETAMINOPHEN 500 MG PO TABS
ORAL_TABLET | ORAL | Status: AC
  Filled 2013-04-17: qty 2

## 2013-04-17 MED ORDER — ACETAMINOPHEN 500 MG PO TABS
1000.0000 mg | ORAL_TABLET | Freq: Four times a day (QID) | ORAL | Status: DC | PRN
  Administered 2013-04-17: 1000 mg via ORAL

## 2013-04-17 MED ORDER — MIDAZOLAM HCL 2 MG/2ML IJ SOLN
INTRAMUSCULAR | Status: AC
  Filled 2013-04-17: qty 2

## 2013-04-17 MED ORDER — FENTANYL CITRATE 0.05 MG/ML IJ SOLN
INTRAMUSCULAR | Status: AC
  Administered 2013-04-17: 50 ug via INTRAVENOUS
  Filled 2013-04-17: qty 2

## 2013-04-17 MED ORDER — ONDANSETRON HCL 4 MG/2ML IJ SOLN
INTRAMUSCULAR | Status: AC
  Filled 2013-04-17: qty 2

## 2013-04-17 MED ORDER — LIDOCAINE HCL (CARDIAC) 20 MG/ML IV SOLN
INTRAVENOUS | Status: AC
  Filled 2013-04-17: qty 5

## 2013-04-17 MED ORDER — LIDOCAINE HCL (CARDIAC) 20 MG/ML IV SOLN
INTRAVENOUS | Status: DC | PRN
  Administered 2013-04-17: 50 mg via INTRAVENOUS

## 2013-04-17 MED ORDER — PROPOFOL 10 MG/ML IV BOLUS
INTRAVENOUS | Status: DC | PRN
  Administered 2013-04-17: 50 mg via INTRAVENOUS
  Administered 2013-04-17: 150 mg via INTRAVENOUS

## 2013-04-17 MED ORDER — FENTANYL CITRATE 0.05 MG/ML IJ SOLN
INTRAMUSCULAR | Status: AC
  Filled 2013-04-17: qty 2

## 2013-04-17 MED ORDER — ONDANSETRON HCL 4 MG/2ML IJ SOLN
INTRAMUSCULAR | Status: DC | PRN
  Administered 2013-04-17: 4 mg via INTRAVENOUS

## 2013-04-17 MED ORDER — PROPOFOL 10 MG/ML IV EMUL
INTRAVENOUS | Status: AC
  Filled 2013-04-17: qty 20

## 2013-04-17 MED ORDER — ACETAMINOPHEN 325 MG PO TABS: 325.0000 mg | ORAL_TABLET | ORAL | Status: DC | PRN

## 2013-04-17 MED ORDER — FENTANYL CITRATE 0.05 MG/ML IJ SOLN
25.0000 ug | INTRAMUSCULAR | Status: DC | PRN
  Administered 2013-04-17 (×2): 50 ug via INTRAVENOUS

## 2013-04-17 MED ORDER — MIDAZOLAM HCL 5 MG/5ML IJ SOLN
INTRAMUSCULAR | Status: DC | PRN
  Administered 2013-04-17: 2 mg via INTRAVENOUS

## 2013-04-17 MED ORDER — ACETAMINOPHEN 160 MG/5ML PO SOLN: 325.0000 mg | ORAL | Status: DC | PRN

## 2013-04-17 SURGICAL SUPPLY — 23 items
CANISTER SUCT 3000ML (MISCELLANEOUS) ×3 IMPLANT
CATH ROBINSON RED A/P 16FR (CATHETERS) ×3 IMPLANT
CLOTH BEACON ORANGE TIMEOUT ST (SAFETY) ×3 IMPLANT
CONTAINER PREFILL 10% NBF 60ML (FORM) ×6 IMPLANT
DRAPE HYSTEROSCOPY (DRAPE) ×3 IMPLANT
DRSG TELFA 3X8 NADH (GAUZE/BANDAGES/DRESSINGS) ×3 IMPLANT
ELECT REM PT RETURN 9FT ADLT (ELECTROSURGICAL) ×3
ELECTRODE REM PT RTRN 9FT ADLT (ELECTROSURGICAL) ×1 IMPLANT
GLOVE BIOGEL PI IND STRL 7.0 (GLOVE) ×2 IMPLANT
GLOVE BIOGEL PI INDICATOR 7.0 (GLOVE) ×4
GLOVE ECLIPSE 6.5 STRL STRAW (GLOVE) ×3 IMPLANT
GOWN STRL REUS W/TWL LRG LVL3 (GOWN DISPOSABLE) ×6 IMPLANT
LOOP ANGLED CUTTING 22FR (CUTTING LOOP) ×3 IMPLANT
NDL SPNL 22GX3.5 QUINCKE BK (NEEDLE) ×1 IMPLANT
NEEDLE SPNL 22GX3.5 QUINCKE BK (NEEDLE) ×3 IMPLANT
PACK VAGINAL MINOR WOMEN LF (CUSTOM PROCEDURE TRAY) ×3 IMPLANT
PAD DRESSING TELFA 3X8 NADH (GAUZE/BANDAGES/DRESSINGS) ×1 IMPLANT
PAD OB MATERNITY 4.3X12.25 (PERSONAL CARE ITEMS) ×3 IMPLANT
SET TUBING HYSTEROSCOPY 2 NDL (TUBING) IMPLANT
SYR CONTROL 10ML LL (SYRINGE) ×3 IMPLANT
TOWEL OR 17X24 6PK STRL BLUE (TOWEL DISPOSABLE) ×6 IMPLANT
TUBE HYSTEROSCOPY W Y-CONNECT (TUBING) IMPLANT
WATER STERILE IRR 1000ML POUR (IV SOLUTION) ×3 IMPLANT

## 2013-04-17 NOTE — Anesthesia Preprocedure Evaluation (Signed)
Anesthesia Evaluation  Patient identified by MRN, date of birth, ID band Patient awake    Reviewed: Allergy & Precautions, H&P , NPO status , Patient's Chart, lab work & pertinent test results, reviewed documented beta blocker date and time   History of Anesthesia Complications Negative for: history of anesthetic complications  Airway Mallampati: III TM Distance: >3 FB Neck ROM: full    Dental  (+) Teeth Intact   Pulmonary Current Smoker (1/2 ppd),  Recently had PFTs for transplant list work up (At [facility]) - was normal per pt breath sounds clear to auscultation  Pulmonary exam normal       Cardiovascular hypertension, On Medications Rhythm:regular Rate:Normal     Neuro/Psych negative neurological ROS  negative psych ROS   GI/Hepatic negative GI ROS, Neg liver ROS,   Endo/Other  negative endocrine ROS  Renal/GU DialysisRenal disease (Dialysis - T-TH-Sat (went yesterday))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 - S/p kidney transplant 1987, refailure 1997  Female GU complaint     Musculoskeletal   Abdominal   Peds  Hematology  (+) anemia ,   Anesthesia Other Findings Sending labs (had dialysis yesterday - most recent labs on chart are from 04/11/13)  Reproductive/Obstetrics negative OB ROS                           Anesthesia Physical Anesthesia Plan  ASA: III  Anesthesia Plan: General LMA   Post-op Pain Management:    Induction:   Airway Management Planned:   Additional Equipment:   Intra-op Plan:   Post-operative Plan:   Informed Consent: I have reviewed the patients History and Physical, chart, labs and discussed the procedure including the risks, benefits and alternatives for the proposed anesthesia with the patient or authorized representative who has indicated his/her understanding and acceptance.   Dental Advisory Given  Plan Discussed with: CRNA and Surgeon  Anesthesia Plan  Comments:         Anesthesia Quick Evaluation

## 2013-04-17 NOTE — Discharge Instructions (Signed)
DISCHARGE INSTRUCTIONS: HYSTEROSCOPY / ENDOMETRIAL ABLATION The following instructions have been prepared to help you care for yourself upon your return home.  Personal hygiene:  Use sanitary pads for vaginal drainage, not tampons.  Shower the day after your procedure.  NO tub baths, pools or Jacuzzis for 2-3 weeks.  Wipe front to back after using the bathroom.  Activity and limitations:  Do NOT drive or operate any equipment for 24 hours. The effects of anesthesia are still present and drowsiness may result.  Do NOT rest in bed all day.  Walking is encouraged.  Walk up and down stairs slowly.  You may resume your normal activity in one to two days or as indicated by your physician.  Sexual activity: NO intercourse for at least 1 week after the procedure, or as indicated by your Doctor.  Diet: Eat a light meal as desired this evening. You may resume your usual diet tomorrow.  Return to Work: You may resume your work activities in one to two days or as indicated by Marine scientist.  What to expect after your surgery: Expect to have vaginal bleeding/discharge for 2-3 days and spotting for up to 10 days. It is not unusual to have soreness for up to 1-2 weeks. You may have a slight burning sensation when you urinate for the first day. Mild cramps may continue for a couple of days. You may have a regular period in 2-6 weeks.  Call your doctor for any of the following:  Excessive vaginal bleeding or clotting, saturating and changing one pad every hour.  Inability to urinate 6 hours after discharge from hospital.  Pain not relieved by pain medication.  Fever of 100.4 F or greater.  Unusual vaginal discharge or odor.  Return to office _________________Call for an appointment ___________________ Patients signature: ______________________ Nurses signature ________________________  [place] [facility] [phone]

## 2013-04-17 NOTE — Preoperative (Signed)
Beta Blockers   Reason not to administer Beta Blockers:Not Applicable 

## 2013-04-17 NOTE — H&P (Signed)
[name] is an 53 y.o. female. 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 Pt was found to have endometrial thickening on sonogram and sonohysterogram done showed complex endometrium ? polyps  Pertinent Gynecological History: Menses: post-menopausal Bleeding: post menopausal bleeding Contraception: post menopausal status DES exposure: denies Blood transfusions: none Sexually transmitted diseases: no past history Previous GYN Procedures: DNC  Last mammogram: normal Date: 02/27/2013 Last pap: normal Date: 02/20/2013 OB History: G2, P1011   Menstrual History: Menarche age: n/a No LMP recorded. Patient is postmenopausal.    Past Medical History  Diagnosis Date  . HTN (hypertension)   . Pancreatitis   . ESRD (end stage renal disease)   . Glomerulonephritis   . Dialysis patient   . Anxiety   . Anemia     Past Surgical History  Procedure Laterality Date  . Kidney transplant    . Dialysis fistula creation Right     Family History  Problem Relation Age of Onset  . Diabetes Maternal Aunt     x2  . Diabetes Maternal Uncle     Social History:  reports that she has been smoking Cigarettes.  She has a 5 pack-year smoking history. She has never used smokeless tobacco. She reports that she does not drink alcohol or use illicit drugs.  Allergies:  Allergies  Allergen Reactions  . Ace Inhibitors Hives and Nausea And Vomiting  . Codeine Hives  . Penicillins Hives  no latex allergy  Prescriptions prior to admission  Medication Sig Dispense Refill  . ALPRAZolam (XANAX) 0.25 MG tablet Take 0.25 mg by mouth at bedtime as needed for anxiety.      Marland Kitchen amLODipine (NORVASC) 10 MG tablet Take 10 mg by mouth daily.      Marland Kitchen MOVIPREP 100 G SOLR Take 1 kit (200 g total) by mouth once.  1 kit  0    ROS neg  There were no vitals taken for this visit. Physical Exam  Constitutional: She is oriented to person, place, and time. She appears well-developed and  well-nourished.  HENT:  Head: Atraumatic.  Eyes: EOM are normal.  Neck: Neck supple.  Cardiovascular: Regular rhythm.   Respiratory: Breath sounds normal.  GI: Soft.  Genitourinary: Vagina normal.  Musculoskeletal: She exhibits no edema.  Neurological: She is alert and oriented to person, place, and time.  Skin: Skin is warm and dry.  Psychiatric: She has a normal mood and affect.  Vulva: nl Adnexa no palp mass Cervix closed.  Vagina: pale atrophic mucosa Uterus (+) fibroid  No results found for this or any previous visit (from the past 24 hour(s)).  No results found.  Assessment/Plan: PMB Endometrial thickening Chronic renal failure syndrome Uterine fibroid  P) dx hysteroscopy, D&C, resection of mass if present. Procedure explained. Risk of surgery including infection, bleeding, uterine perforation and its risk, thermal injury, fluid overload  Vallie Teters A 04/17/2013, 5:53 AM

## 2013-04-17 NOTE — Transfer of Care (Signed)
Immediate Anesthesia Transfer of Care Note  Patient: [name]  Procedure(s) Performed: Procedure(s): DILATATION & CURETTAGE/HYSTEROSCOPY WITH RESECTOCOPE (N/A)  Patient Location: PACU  Anesthesia Type:General  Level of Consciousness: awake, alert  and oriented  Airway & Oxygen Therapy: Patient Spontanous Breathing and Patient connected to nasal cannula oxygen  Post-op Assessment: Report given to PACU RN  Post vital signs: Reviewed  Complications: No apparent anesthesia complications

## 2013-04-17 NOTE — Op Note (Signed)
[name], [name] NO.:  [id number]  MEDICAL RECORD NO.:  [id number]  LOCATION:  [facility]                          FACILITY:  [facility]  PHYSICIAN:  Servando Salina, M.D.DATE OF BIRTH:  02-07-1960  DATE OF PROCEDURE:  04/17/2013 DATE OF DISCHARGE:                              OPERATIVE REPORT   PREOPERATIVE DIAGNOSES:  Postmenopausal bleeding, complex endometrial thickening.  PROCEDURE:  Diagnostic hysteroscopy, hysteroscopic resection of submucosal fibroid, and endometrial polyps,  dilation and curettage.  POSTOPERATIVE DIAGNOSES:  Postmenopausal bleeding, submucosal fibroid and endometrial polyps.  ANESTHESIA:  General.  SURGEON:  Servando Salina, M.D.  ASSISTANT:  None.  DESCRIPTION OF PROCEDURE:  Under adequate general anesthesia, the patient was placed in the dorsal lithotomy position.  She was sterilely prepped and draped in usual fashion.  The bladder was catheterized for moderate amount of urine.  Examination under anesthesia reveals a anteflexed uterus slightly irregular.  No adnexal masses could be appreciated.  Bivalve speculum was placed in the vagina.  A single-tooth tenaculum was placed on the anterior lip of the cervix.  The cervix was then serially dilated up to a #19 Pratt dilator.  A diagnostic hysteroscope was introduced into the uterine cavity.  An irregular cavity was noted, but multiple polypoid lesions were seen.  At that point, the hysteroscope was removed.  The cervix was then further dilated up to #27 [facility] dilator.  A resectoscope with a single loop was introduced into the uterine cavity.  The endometrial polyps were all resected.  The fluid was then decreased in pressure and a right anterior submucosal fibroid was noted which was then resected.  The resectoscope was then removed.  The cavity was then curetted for all the tissue that was present.  The resectoscope was then reinserted.  The cavity was inspected.  Endocervical  canal was without any lesions.  No other lesions were noted.  At which point, the procedure was felt to be complete.  All instruments were then removed from the vagina.  SPECIMEN:  Labeled endometrial curetting with submucosal fibroid resection of polyps was sent to Pathology.  ESTIMATED BLOOD LOSS:  10 mL.  COMPLICATION:  None.  DISPOSITION:  The patient tolerated the procedure well, was transferred to the recovery room in stable condition.     Servando Salina, M.D.     [place]/MEDQ  D:  04/17/2013  T:  04/17/2013  Job:  [id number]

## 2013-04-17 NOTE — Brief Op Note (Signed)
04/17/2013  8:30 AM  PATIENT:  [name]  53 y.o. female  PRE-OPERATIVE DIAGNOSIS:  Postmenopausal Bleeding, Endometrial Thickening  POST-OPERATIVE DIAGNOSIS:  Postmenopausal Bleeding, Endometrial polyps, Submucosal fibroid  PROCEDURE:  Diagnostic hysteroscopy, hysteroscopic resection of endometrial polyps, submucosal fibroid  SURGEON:  Surgeon(s) and Role:    * Carsen Machi Clint Bolder, MD - Primary  PHYSICIAN ASSISTANT:   ASSISTANTS: none   ANESTHESIA:   general Findings: multiple endometrial polyps, SM fibroid nl endocervical canal EBL:  Total I/O In: 350 [I.V.:350] Out: -   BLOOD ADMINISTERED:none  DRAINS: none   LOCAL MEDICATIONS USED:  NONE  SPECIMEN:  Source of Specimen:  emc w/ fibroid resection and polyps  DISPOSITION OF SPECIMEN:  PATHOLOGY  COUNTS:  YES  TOURNIQUET:  * No tourniquets in log *  DICTATION: .Other Dictation: Dictation Number [id number]  PLAN OF CARE: Discharge to home after PACU  PATIENT DISPOSITION:  PACU - hemodynamically stable.   Delay start of Pharmacological VTE agent (>24hrs) due to surgical blood loss or risk of bleeding: no

## 2013-04-18 ENCOUNTER — Encounter (HOSPITAL_COMMUNITY): Payer: Self-pay | Admitting: Obstetrics and Gynecology

## 2013-04-18 DIAGNOSIS — D631 Anemia in chronic kidney disease: Secondary | ICD-10-CM | POA: Diagnosis not present

## 2013-04-18 DIAGNOSIS — N186 End stage renal disease: Secondary | ICD-10-CM | POA: Diagnosis not present

## 2013-04-18 DIAGNOSIS — D509 Iron deficiency anemia, unspecified: Secondary | ICD-10-CM | POA: Diagnosis not present

## 2013-04-18 DIAGNOSIS — N2581 Secondary hyperparathyroidism of renal origin: Secondary | ICD-10-CM | POA: Diagnosis not present

## 2013-04-18 NOTE — Anesthesia Postprocedure Evaluation (Signed)
  Anesthesia Post-op Note  Patient: [name]  Procedure(s) Performed: Procedure(s): DILATATION & CURETTAGE/HYSTEROSCOPY WITH RESECTOCOPE (N/A) Patient is awake and responsive. Pain and nausea are reasonably well controlled. Vital signs are stable and clinically acceptable. Oxygen saturation is clinically acceptable. There are no apparent anesthetic complications at this time. Patient is ready for discharge.

## 2013-04-22 ENCOUNTER — Ambulatory Visit (AMBULATORY_SURGERY_CENTER): Payer: Medicare Other | Admitting: Internal Medicine

## 2013-04-22 ENCOUNTER — Encounter: Payer: Self-pay | Admitting: Internal Medicine

## 2013-04-22 VITALS — BP 171/102 | HR 69 | Temp 98.4°F | Resp 10 | Ht 67.25 in | Wt 144.0 lb

## 2013-04-22 DIAGNOSIS — Z1211 Encounter for screening for malignant neoplasm of colon: Secondary | ICD-10-CM | POA: Diagnosis not present

## 2013-04-22 DIAGNOSIS — N186 End stage renal disease: Secondary | ICD-10-CM | POA: Diagnosis not present

## 2013-04-22 DIAGNOSIS — I10 Essential (primary) hypertension: Secondary | ICD-10-CM | POA: Diagnosis not present

## 2013-04-22 DIAGNOSIS — D649 Anemia, unspecified: Secondary | ICD-10-CM | POA: Diagnosis not present

## 2013-04-22 MED ORDER — SODIUM CHLORIDE 0.9 % IV SOLN: 500.0000 mL | INTRAVENOUS | Status: DC

## 2013-04-22 NOTE — Progress Notes (Signed)
Procedure ends, to recovery, report given and VSS. 

## 2013-04-22 NOTE — Op Note (Signed)
[place]  [address] [place], [id number]   COLONOSCOPY PROCEDURE REPORT  PATIENT: Margaret, [name]  MR#: [id number] BIRTHDATE: [birth year] , 52  yrs. old GENDER: Female ENDOSCOPIST: Eustace Quail, MD REFERRED [id number] [place], M.D. PROCEDURE DATE:  04/22/2013 PROCEDURE:   Colonoscopy, screening First Screening Colonoscopy - Avg.  risk and is 50 yrs.  old or older - No.  Prior Negative Screening - Now for repeat screening. 10 or more years since last screening  History of Adenoma - Now for follow-up colonoscopy & has been > or = to 3 yrs.  N/A  Polyps Removed Today? No.  Recommend repeat exam, <10 yrs? No. ASA CLASS:   Class III INDICATIONS:average risk screening. Previous colonoscopic exam 2004 was negative for neoplasia. MEDICATIONS: MAC sedation, administered by CRNA and propofol (Diprivan) 330mg  IV  DESCRIPTION OF PROCEDURE:   After the risks benefits and alternatives of the procedure were thoroughly explained, informed consent was obtained.  A digital rectal exam revealed no abnormalities of the rectum.   The LB [id number]  endoscope was introduced through the anus and advanced to the cecum, which was identified by both the appendix and ileocecal valve. No adverse events experienced.   The quality of the prep was adequate, using MoviPrep  The instrument was then slowly withdrawn as the colon was fully examined.      COLON FINDINGS: Severe diverticulosis was noted throughout the entire examined colon.   Mild melanosis was found throughout the entire examined colon.   The colon mucosa was otherwise normal. Retroflexed views revealed internal hemorrhoids. The time to cecum=7 minutes 25 seconds.  Withdrawal time=10 minutes 17 seconds. The scope was withdrawn and the procedure completed. COMPLICATIONS: There were no complications.  ENDOSCOPIC IMPRESSION: 1.   Severe diverticulosis was noted throughout the entire examined colon 2.   Mild  melanosis was found throughout the entire examined colon 3.   The colon mucosa was otherwise normal  RECOMMENDATIONS: 1.Continue current colorectal screening recommendations for "routine risk" patients with a repeat colonoscopy in 10 years.   eSigned:  Eustace Quail, MD 04/22/2013 3:37 PM   cc: Corliss Parish, MD and The Patient

## 2013-04-22 NOTE — Patient Instructions (Signed)
YOU HAD AN ENDOSCOPIC PROCEDURE TODAY AT [facility]: Refer to the procedure report that was given to you for any specific questions about what was found during the examination.  If the procedure report does not answer your questions, please call your gastroenterologist to clarify.  If you requested that your care partner not be given the details of your procedure findings, then the procedure report has been included in a sealed envelope for you to review at your convenience later.  YOU SHOULD EXPECT: Some feelings of bloating in the abdomen. Passage of more gas than usual.  Walking can help get rid of the air that was put into your GI tract during the procedure and reduce the bloating. If you had a lower endoscopy (such as a colonoscopy or flexible sigmoidoscopy) you may notice spotting of blood in your stool or on the toilet paper. If you underwent a bowel prep for your procedure, then you may not have a normal bowel movement for a few days.  DIET: Your first meal following the procedure should be a light meal and then it is ok to progress to your normal diet.  A half-sandwich or bowl of soup is an example of a good first meal.  Heavy or fried foods are harder to digest and may make you feel nauseous or bloated.  Likewise meals heavy in dairy and vegetables can cause extra gas to form and this can also increase the bloating.  Drink plenty of fluids but you should avoid alcoholic beverages for 24 hours.  Try to increase the fiber in your diet to prevent Diverticulitis.  ACTIVITY: Your care partner should take you home directly after the procedure.  You should plan to take it easy, moving slowly for the rest of the day.  You can resume normal activity the day after the procedure however you should NOT DRIVE or use heavy machinery for 24 hours (because of the sedation medicines used during the test).    SYMPTOMS TO REPORT IMMEDIATELY: A gastroenterologist can be reached at any hour.  During  normal business hours, 8:30 AM to 5:00 PM Monday through Friday, call [phone].  After hours and on weekends, please call the GI answering service at [phone] who will take a message and have the physician on call contact you.   Following lower endoscopy (colonoscopy or flexible sigmoidoscopy):  Excessive amounts of blood in the stool  Significant tenderness or worsening of abdominal pains  Swelling of the abdomen that is new, acute  Fever of 100F or higher FOLLOW UP: If any biopsies were taken you will be contacted by phone or by letter within the next 1-3 weeks.  Call your gastroenterologist if you have not heard about the biopsies in 3 weeks.  Our staff will call the home number listed on your records the next business day following your procedure to check on you and address any questions or concerns that you may have at that time regarding the information given to you following your procedure. This is a courtesy call and so if there is no answer at the home number and we have not heard from you through the emergency physician on call, we will assume that you have returned to your regular daily activities without incident.  SIGNATURES/CONFIDENTIALITY: You and/or your care partner have signed paperwork which will be entered into your electronic medical record.  These signatures attest to the fact that that the information above on your After Visit Summary has been reviewed  and is understood.  Full responsibility of the confidentiality of this discharge information lies with you and/or your care-partner. 

## 2013-04-23 ENCOUNTER — Telehealth: Payer: Self-pay

## 2013-04-23 NOTE — Telephone Encounter (Signed)
Left a message at [phone] for the pt to call back if any questions or concerns. Maw

## 2013-05-15 ENCOUNTER — Ambulatory Visit: Payer: Medicare Other | Admitting: Cardiovascular Disease

## 2013-05-16 DIAGNOSIS — N186 End stage renal disease: Secondary | ICD-10-CM | POA: Diagnosis not present

## 2013-05-18 DIAGNOSIS — N186 End stage renal disease: Secondary | ICD-10-CM | POA: Diagnosis not present

## 2013-05-18 DIAGNOSIS — D631 Anemia in chronic kidney disease: Secondary | ICD-10-CM | POA: Diagnosis not present

## 2013-05-18 DIAGNOSIS — D509 Iron deficiency anemia, unspecified: Secondary | ICD-10-CM | POA: Diagnosis not present

## 2013-05-18 DIAGNOSIS — N2581 Secondary hyperparathyroidism of renal origin: Secondary | ICD-10-CM | POA: Diagnosis not present

## 2013-06-12 ENCOUNTER — Encounter: Payer: Self-pay | Admitting: Cardiovascular Disease

## 2013-06-12 ENCOUNTER — Ambulatory Visit (INDEPENDENT_AMBULATORY_CARE_PROVIDER_SITE_OTHER): Payer: Medicare Other | Admitting: Cardiovascular Disease

## 2013-06-12 VITALS — BP 137/74 | HR 84 | Ht 67.5 in | Wt 145.6 lb

## 2013-06-12 DIAGNOSIS — I10 Essential (primary) hypertension: Secondary | ICD-10-CM | POA: Diagnosis not present

## 2013-06-12 DIAGNOSIS — R000 Tachycardia, unspecified: Secondary | ICD-10-CM | POA: Diagnosis not present

## 2013-06-12 DIAGNOSIS — N19 Unspecified kidney failure: Secondary | ICD-10-CM

## 2013-06-12 DIAGNOSIS — Z992 Dependence on renal dialysis: Secondary | ICD-10-CM

## 2013-06-12 DIAGNOSIS — N186 End stage renal disease: Secondary | ICD-10-CM | POA: Insufficient documentation

## 2013-06-12 NOTE — Assessment & Plan Note (Signed)
Continue norvasc  F/u renal  See note regarding tachycardia

## 2013-06-12 NOTE — Patient Instructions (Signed)
Your physician recommends that you schedule a follow-up appointment in: AS NEEDED  Your physician recommends that you continue on your current medications as directed. Please refer to the Current Medication list given to you today.  

## 2013-06-12 NOTE — Progress Notes (Signed)
Patient ID: [name], female   DOB: November 07, 1960, 53 y.o.   MRN: [id number]  Delightful 53 yo dialysis patient referred for tachycardia.  Over a month ago had two episodes of rapid HR during dialysis  I cannot find documentation.  Patient indicates she thinks Her weight was up and they drew fluid off too fast.  In general she does not get palpitations.  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  She gets yearly cardiac testing there for the transplant list and just had a normal stress echo on February She does smoke.  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  All other systems reviewed and negative   General: Affect appropriate Thin black female  HEENT: normal Neck supple with no adenopathy JVP normal no bruits no thyromegaly Lungs clear with no wheezing and good diaphragmatic motion Heart:  S1/S2 no murmur,rub, gallop or click PMI normal Abdomen: benighn, BS positve, no tenderness, no AAA no bruit.  No HSM or HJR Functional fistula RUE and non functional old ones in LUE with parathyroid transplant No edema Neuro non-focal Skin warm and dry No muscular weakness  Medications Current Outpatient Prescriptions  Medication Sig Dispense Refill  . ALPRAZolam (XANAX) 0.25 MG tablet Take 0.25 mg by mouth at bedtime as needed for anxiety.      Marland Kitchen amLODipine (NORVASC) 10 MG tablet Take 10 mg by mouth daily.       No current facility-administered medications for this visit.    Allergies Ace inhibitors;  Codeine; and Penicillins  Family History: Family History  Problem Relation Age of Onset  . Adopted: Yes  . Diabetes Maternal Aunt     x2  . Diabetes Maternal Uncle     Social History: History   Social History  . Marital Status: Divorced    Spouse Name: N/A    Number of Children: 1  . Years of Education: N/A   Occupational History  . Not on file.   Social History Main Topics  . Smoking status: Current Every Day Smoker -- 0.25 packs/day for 20 years    Types: Cigarettes  . Smokeless tobacco: Never Used  . Alcohol Use: No  . Drug Use: No  . Sexual Activity: Not on file   Other Topics Concern  . Not on file   Social History Narrative  . No narrative on file    Electrocardiogram:  SR PAC LVH T wave inversion 3,f  Assessment and Plan

## 2013-06-12 NOTE — Assessment & Plan Note (Signed)
Functional fistula LUE  F/U Dr Clover Mealy for Hb/Hct TSH Calcium and other typical labs  F/U transplant evaluation at [facility]

## 2013-06-12 NOTE — Assessment & Plan Note (Signed)
Likely related to flow rate during dialysis  No evidence of PAF/SVT  Can change norvasc to lopressor in future if recurs.  Will get records from [facility] regarding cardiac w/u for transplant and normal stress echo

## 2013-06-16 DIAGNOSIS — N186 End stage renal disease: Secondary | ICD-10-CM | POA: Diagnosis not present

## 2013-06-18 DIAGNOSIS — D631 Anemia in chronic kidney disease: Secondary | ICD-10-CM | POA: Diagnosis not present

## 2013-06-18 DIAGNOSIS — N186 End stage renal disease: Secondary | ICD-10-CM | POA: Diagnosis not present

## 2013-06-18 DIAGNOSIS — N039 Chronic nephritic syndrome with unspecified morphologic changes: Secondary | ICD-10-CM | POA: Diagnosis not present

## 2013-06-18 DIAGNOSIS — D509 Iron deficiency anemia, unspecified: Secondary | ICD-10-CM | POA: Diagnosis not present

## 2013-06-18 DIAGNOSIS — N2581 Secondary hyperparathyroidism of renal origin: Secondary | ICD-10-CM | POA: Diagnosis not present

## 2013-06-20 DIAGNOSIS — D509 Iron deficiency anemia, unspecified: Secondary | ICD-10-CM | POA: Diagnosis not present

## 2013-06-20 DIAGNOSIS — N2581 Secondary hyperparathyroidism of renal origin: Secondary | ICD-10-CM | POA: Diagnosis not present

## 2013-06-20 DIAGNOSIS — N186 End stage renal disease: Secondary | ICD-10-CM | POA: Diagnosis not present

## 2013-06-20 DIAGNOSIS — D631 Anemia in chronic kidney disease: Secondary | ICD-10-CM | POA: Diagnosis not present

## 2013-06-20 DIAGNOSIS — N039 Chronic nephritic syndrome with unspecified morphologic changes: Secondary | ICD-10-CM | POA: Diagnosis not present

## 2013-06-22 DIAGNOSIS — D509 Iron deficiency anemia, unspecified: Secondary | ICD-10-CM | POA: Diagnosis not present

## 2013-06-22 DIAGNOSIS — N2581 Secondary hyperparathyroidism of renal origin: Secondary | ICD-10-CM | POA: Diagnosis not present

## 2013-06-22 DIAGNOSIS — N039 Chronic nephritic syndrome with unspecified morphologic changes: Secondary | ICD-10-CM | POA: Diagnosis not present

## 2013-06-22 DIAGNOSIS — D631 Anemia in chronic kidney disease: Secondary | ICD-10-CM | POA: Diagnosis not present

## 2013-06-22 DIAGNOSIS — N186 End stage renal disease: Secondary | ICD-10-CM | POA: Diagnosis not present

## 2013-06-25 DIAGNOSIS — D509 Iron deficiency anemia, unspecified: Secondary | ICD-10-CM | POA: Diagnosis not present

## 2013-06-25 DIAGNOSIS — N2581 Secondary hyperparathyroidism of renal origin: Secondary | ICD-10-CM | POA: Diagnosis not present

## 2013-06-25 DIAGNOSIS — N186 End stage renal disease: Secondary | ICD-10-CM | POA: Diagnosis not present

## 2013-06-25 DIAGNOSIS — D631 Anemia in chronic kidney disease: Secondary | ICD-10-CM | POA: Diagnosis not present

## 2013-06-27 DIAGNOSIS — D509 Iron deficiency anemia, unspecified: Secondary | ICD-10-CM | POA: Diagnosis not present

## 2013-06-27 DIAGNOSIS — N2581 Secondary hyperparathyroidism of renal origin: Secondary | ICD-10-CM | POA: Diagnosis not present

## 2013-06-27 DIAGNOSIS — N186 End stage renal disease: Secondary | ICD-10-CM | POA: Diagnosis not present

## 2013-06-27 DIAGNOSIS — D631 Anemia in chronic kidney disease: Secondary | ICD-10-CM | POA: Diagnosis not present

## 2013-06-28 DIAGNOSIS — N186 End stage renal disease: Secondary | ICD-10-CM | POA: Diagnosis not present

## 2013-06-28 DIAGNOSIS — D631 Anemia in chronic kidney disease: Secondary | ICD-10-CM | POA: Diagnosis not present

## 2013-06-28 DIAGNOSIS — N2581 Secondary hyperparathyroidism of renal origin: Secondary | ICD-10-CM | POA: Diagnosis not present

## 2013-06-28 DIAGNOSIS — D509 Iron deficiency anemia, unspecified: Secondary | ICD-10-CM | POA: Diagnosis not present

## 2013-07-02 DIAGNOSIS — N186 End stage renal disease: Secondary | ICD-10-CM | POA: Diagnosis not present

## 2013-07-04 DIAGNOSIS — N186 End stage renal disease: Secondary | ICD-10-CM | POA: Diagnosis not present

## 2013-07-06 DIAGNOSIS — N186 End stage renal disease: Secondary | ICD-10-CM | POA: Diagnosis not present

## 2013-07-09 DIAGNOSIS — N186 End stage renal disease: Secondary | ICD-10-CM | POA: Diagnosis not present

## 2013-07-11 DIAGNOSIS — N186 End stage renal disease: Secondary | ICD-10-CM | POA: Diagnosis not present

## 2013-07-13 DIAGNOSIS — N186 End stage renal disease: Secondary | ICD-10-CM | POA: Diagnosis not present

## 2013-07-16 DIAGNOSIS — D631 Anemia in chronic kidney disease: Secondary | ICD-10-CM | POA: Diagnosis not present

## 2013-07-16 DIAGNOSIS — D509 Iron deficiency anemia, unspecified: Secondary | ICD-10-CM | POA: Diagnosis not present

## 2013-07-16 DIAGNOSIS — N2581 Secondary hyperparathyroidism of renal origin: Secondary | ICD-10-CM | POA: Diagnosis not present

## 2013-07-16 DIAGNOSIS — N186 End stage renal disease: Secondary | ICD-10-CM | POA: Diagnosis not present

## 2013-07-18 DIAGNOSIS — D631 Anemia in chronic kidney disease: Secondary | ICD-10-CM | POA: Diagnosis not present

## 2013-07-18 DIAGNOSIS — N2581 Secondary hyperparathyroidism of renal origin: Secondary | ICD-10-CM | POA: Diagnosis not present

## 2013-07-18 DIAGNOSIS — N186 End stage renal disease: Secondary | ICD-10-CM | POA: Diagnosis not present

## 2013-07-18 DIAGNOSIS — D509 Iron deficiency anemia, unspecified: Secondary | ICD-10-CM | POA: Diagnosis not present

## 2013-07-18 DIAGNOSIS — D539 Nutritional anemia, unspecified: Secondary | ICD-10-CM | POA: Diagnosis not present

## 2013-08-16 DIAGNOSIS — N186 End stage renal disease: Secondary | ICD-10-CM | POA: Diagnosis not present

## 2013-08-17 DIAGNOSIS — D509 Iron deficiency anemia, unspecified: Secondary | ICD-10-CM | POA: Diagnosis not present

## 2013-08-17 DIAGNOSIS — D539 Nutritional anemia, unspecified: Secondary | ICD-10-CM | POA: Diagnosis not present

## 2013-08-17 DIAGNOSIS — D631 Anemia in chronic kidney disease: Secondary | ICD-10-CM | POA: Diagnosis not present

## 2013-08-17 DIAGNOSIS — N186 End stage renal disease: Secondary | ICD-10-CM | POA: Diagnosis not present

## 2013-08-17 DIAGNOSIS — N2581 Secondary hyperparathyroidism of renal origin: Secondary | ICD-10-CM | POA: Diagnosis not present

## 2013-09-16 DIAGNOSIS — N186 End stage renal disease: Secondary | ICD-10-CM | POA: Diagnosis not present

## 2013-09-17 DIAGNOSIS — N186 End stage renal disease: Secondary | ICD-10-CM | POA: Diagnosis not present

## 2013-09-17 DIAGNOSIS — N2581 Secondary hyperparathyroidism of renal origin: Secondary | ICD-10-CM | POA: Diagnosis not present

## 2013-09-17 DIAGNOSIS — D509 Iron deficiency anemia, unspecified: Secondary | ICD-10-CM | POA: Diagnosis not present

## 2013-09-17 DIAGNOSIS — D539 Nutritional anemia, unspecified: Secondary | ICD-10-CM | POA: Diagnosis not present

## 2013-10-16 DIAGNOSIS — N186 End stage renal disease: Secondary | ICD-10-CM | POA: Diagnosis not present

## 2013-10-17 DIAGNOSIS — N2581 Secondary hyperparathyroidism of renal origin: Secondary | ICD-10-CM | POA: Diagnosis not present

## 2013-10-17 DIAGNOSIS — D509 Iron deficiency anemia, unspecified: Secondary | ICD-10-CM | POA: Diagnosis not present

## 2013-10-17 DIAGNOSIS — D631 Anemia in chronic kidney disease: Secondary | ICD-10-CM | POA: Diagnosis not present

## 2013-10-17 DIAGNOSIS — N186 End stage renal disease: Secondary | ICD-10-CM | POA: Diagnosis not present

## 2013-11-16 DIAGNOSIS — Z992 Dependence on renal dialysis: Secondary | ICD-10-CM | POA: Diagnosis not present

## 2013-11-16 DIAGNOSIS — N186 End stage renal disease: Secondary | ICD-10-CM | POA: Diagnosis not present

## 2013-11-19 DIAGNOSIS — N186 End stage renal disease: Secondary | ICD-10-CM | POA: Diagnosis not present

## 2013-11-19 DIAGNOSIS — D631 Anemia in chronic kidney disease: Secondary | ICD-10-CM | POA: Diagnosis not present

## 2013-11-19 DIAGNOSIS — N2581 Secondary hyperparathyroidism of renal origin: Secondary | ICD-10-CM | POA: Diagnosis not present

## 2013-11-19 DIAGNOSIS — D509 Iron deficiency anemia, unspecified: Secondary | ICD-10-CM | POA: Diagnosis not present

## 2013-12-16 DIAGNOSIS — N186 End stage renal disease: Secondary | ICD-10-CM | POA: Diagnosis not present

## 2013-12-16 DIAGNOSIS — Z992 Dependence on renal dialysis: Secondary | ICD-10-CM | POA: Diagnosis not present

## 2013-12-17 DIAGNOSIS — D631 Anemia in chronic kidney disease: Secondary | ICD-10-CM | POA: Diagnosis not present

## 2013-12-17 DIAGNOSIS — N2581 Secondary hyperparathyroidism of renal origin: Secondary | ICD-10-CM | POA: Diagnosis not present

## 2013-12-17 DIAGNOSIS — D509 Iron deficiency anemia, unspecified: Secondary | ICD-10-CM | POA: Diagnosis not present

## 2013-12-17 DIAGNOSIS — N186 End stage renal disease: Secondary | ICD-10-CM | POA: Diagnosis not present

## 2014-01-16 DIAGNOSIS — Z992 Dependence on renal dialysis: Secondary | ICD-10-CM | POA: Diagnosis not present

## 2014-01-16 DIAGNOSIS — N186 End stage renal disease: Secondary | ICD-10-CM | POA: Diagnosis not present

## 2014-01-19 DIAGNOSIS — D509 Iron deficiency anemia, unspecified: Secondary | ICD-10-CM | POA: Diagnosis not present

## 2014-01-19 DIAGNOSIS — D631 Anemia in chronic kidney disease: Secondary | ICD-10-CM | POA: Diagnosis not present

## 2014-01-19 DIAGNOSIS — N2581 Secondary hyperparathyroidism of renal origin: Secondary | ICD-10-CM | POA: Diagnosis not present

## 2014-01-19 DIAGNOSIS — N186 End stage renal disease: Secondary | ICD-10-CM | POA: Diagnosis not present

## 2014-01-21 DIAGNOSIS — N186 End stage renal disease: Secondary | ICD-10-CM | POA: Diagnosis not present

## 2014-01-21 DIAGNOSIS — D509 Iron deficiency anemia, unspecified: Secondary | ICD-10-CM | POA: Diagnosis not present

## 2014-01-21 DIAGNOSIS — D631 Anemia in chronic kidney disease: Secondary | ICD-10-CM | POA: Diagnosis not present

## 2014-01-21 DIAGNOSIS — N2581 Secondary hyperparathyroidism of renal origin: Secondary | ICD-10-CM | POA: Diagnosis not present

## 2014-01-23 DIAGNOSIS — N2581 Secondary hyperparathyroidism of renal origin: Secondary | ICD-10-CM | POA: Diagnosis not present

## 2014-01-23 DIAGNOSIS — D509 Iron deficiency anemia, unspecified: Secondary | ICD-10-CM | POA: Diagnosis not present

## 2014-01-23 DIAGNOSIS — D631 Anemia in chronic kidney disease: Secondary | ICD-10-CM | POA: Diagnosis not present

## 2014-01-23 DIAGNOSIS — N186 End stage renal disease: Secondary | ICD-10-CM | POA: Diagnosis not present

## 2014-01-25 DIAGNOSIS — D509 Iron deficiency anemia, unspecified: Secondary | ICD-10-CM | POA: Diagnosis not present

## 2014-01-25 DIAGNOSIS — D631 Anemia in chronic kidney disease: Secondary | ICD-10-CM | POA: Diagnosis not present

## 2014-01-25 DIAGNOSIS — N186 End stage renal disease: Secondary | ICD-10-CM | POA: Diagnosis not present

## 2014-01-25 DIAGNOSIS — N2581 Secondary hyperparathyroidism of renal origin: Secondary | ICD-10-CM | POA: Diagnosis not present

## 2014-01-28 DIAGNOSIS — D509 Iron deficiency anemia, unspecified: Secondary | ICD-10-CM | POA: Diagnosis not present

## 2014-01-28 DIAGNOSIS — N186 End stage renal disease: Secondary | ICD-10-CM | POA: Diagnosis not present

## 2014-01-28 DIAGNOSIS — D631 Anemia in chronic kidney disease: Secondary | ICD-10-CM | POA: Diagnosis not present

## 2014-01-28 DIAGNOSIS — N2581 Secondary hyperparathyroidism of renal origin: Secondary | ICD-10-CM | POA: Diagnosis not present

## 2014-01-30 DIAGNOSIS — N186 End stage renal disease: Secondary | ICD-10-CM | POA: Diagnosis not present

## 2014-01-30 DIAGNOSIS — D631 Anemia in chronic kidney disease: Secondary | ICD-10-CM | POA: Diagnosis not present

## 2014-01-30 DIAGNOSIS — D509 Iron deficiency anemia, unspecified: Secondary | ICD-10-CM | POA: Diagnosis not present

## 2014-01-30 DIAGNOSIS — N2581 Secondary hyperparathyroidism of renal origin: Secondary | ICD-10-CM | POA: Diagnosis not present

## 2014-02-01 DIAGNOSIS — N2581 Secondary hyperparathyroidism of renal origin: Secondary | ICD-10-CM | POA: Diagnosis not present

## 2014-02-01 DIAGNOSIS — D631 Anemia in chronic kidney disease: Secondary | ICD-10-CM | POA: Diagnosis not present

## 2014-02-01 DIAGNOSIS — D509 Iron deficiency anemia, unspecified: Secondary | ICD-10-CM | POA: Diagnosis not present

## 2014-02-01 DIAGNOSIS — N186 End stage renal disease: Secondary | ICD-10-CM | POA: Diagnosis not present

## 2014-02-04 DIAGNOSIS — N186 End stage renal disease: Secondary | ICD-10-CM | POA: Diagnosis not present

## 2014-02-04 DIAGNOSIS — N2581 Secondary hyperparathyroidism of renal origin: Secondary | ICD-10-CM | POA: Diagnosis not present

## 2014-02-04 DIAGNOSIS — D509 Iron deficiency anemia, unspecified: Secondary | ICD-10-CM | POA: Diagnosis not present

## 2014-02-04 DIAGNOSIS — D631 Anemia in chronic kidney disease: Secondary | ICD-10-CM | POA: Diagnosis not present

## 2014-02-06 DIAGNOSIS — N2581 Secondary hyperparathyroidism of renal origin: Secondary | ICD-10-CM | POA: Diagnosis not present

## 2014-02-06 DIAGNOSIS — D509 Iron deficiency anemia, unspecified: Secondary | ICD-10-CM | POA: Diagnosis not present

## 2014-02-06 DIAGNOSIS — D631 Anemia in chronic kidney disease: Secondary | ICD-10-CM | POA: Diagnosis not present

## 2014-02-06 DIAGNOSIS — N186 End stage renal disease: Secondary | ICD-10-CM | POA: Diagnosis not present

## 2014-02-08 DIAGNOSIS — D631 Anemia in chronic kidney disease: Secondary | ICD-10-CM | POA: Diagnosis not present

## 2014-02-08 DIAGNOSIS — N186 End stage renal disease: Secondary | ICD-10-CM | POA: Diagnosis not present

## 2014-02-08 DIAGNOSIS — N2581 Secondary hyperparathyroidism of renal origin: Secondary | ICD-10-CM | POA: Diagnosis not present

## 2014-02-08 DIAGNOSIS — D509 Iron deficiency anemia, unspecified: Secondary | ICD-10-CM | POA: Diagnosis not present

## 2014-02-11 DIAGNOSIS — N186 End stage renal disease: Secondary | ICD-10-CM | POA: Diagnosis not present

## 2014-02-11 DIAGNOSIS — D631 Anemia in chronic kidney disease: Secondary | ICD-10-CM | POA: Diagnosis not present

## 2014-02-11 DIAGNOSIS — D509 Iron deficiency anemia, unspecified: Secondary | ICD-10-CM | POA: Diagnosis not present

## 2014-02-11 DIAGNOSIS — N2581 Secondary hyperparathyroidism of renal origin: Secondary | ICD-10-CM | POA: Diagnosis not present

## 2014-02-13 DIAGNOSIS — D631 Anemia in chronic kidney disease: Secondary | ICD-10-CM | POA: Diagnosis not present

## 2014-02-13 DIAGNOSIS — D509 Iron deficiency anemia, unspecified: Secondary | ICD-10-CM | POA: Diagnosis not present

## 2014-02-13 DIAGNOSIS — N2581 Secondary hyperparathyroidism of renal origin: Secondary | ICD-10-CM | POA: Diagnosis not present

## 2014-02-13 DIAGNOSIS — N186 End stage renal disease: Secondary | ICD-10-CM | POA: Diagnosis not present

## 2014-02-15 DIAGNOSIS — D509 Iron deficiency anemia, unspecified: Secondary | ICD-10-CM | POA: Diagnosis not present

## 2014-02-15 DIAGNOSIS — N186 End stage renal disease: Secondary | ICD-10-CM | POA: Diagnosis not present

## 2014-02-15 DIAGNOSIS — N2581 Secondary hyperparathyroidism of renal origin: Secondary | ICD-10-CM | POA: Diagnosis not present

## 2014-02-15 DIAGNOSIS — D631 Anemia in chronic kidney disease: Secondary | ICD-10-CM | POA: Diagnosis not present

## 2014-02-16 DIAGNOSIS — Z992 Dependence on renal dialysis: Secondary | ICD-10-CM | POA: Diagnosis not present

## 2014-02-16 DIAGNOSIS — N186 End stage renal disease: Secondary | ICD-10-CM | POA: Diagnosis not present

## 2014-02-18 DIAGNOSIS — D509 Iron deficiency anemia, unspecified: Secondary | ICD-10-CM | POA: Diagnosis not present

## 2014-02-18 DIAGNOSIS — N2581 Secondary hyperparathyroidism of renal origin: Secondary | ICD-10-CM | POA: Diagnosis not present

## 2014-02-18 DIAGNOSIS — N186 End stage renal disease: Secondary | ICD-10-CM | POA: Diagnosis not present

## 2014-02-20 DIAGNOSIS — N2581 Secondary hyperparathyroidism of renal origin: Secondary | ICD-10-CM | POA: Diagnosis not present

## 2014-02-20 DIAGNOSIS — D509 Iron deficiency anemia, unspecified: Secondary | ICD-10-CM | POA: Diagnosis not present

## 2014-02-20 DIAGNOSIS — N186 End stage renal disease: Secondary | ICD-10-CM | POA: Diagnosis not present

## 2014-02-22 DIAGNOSIS — N186 End stage renal disease: Secondary | ICD-10-CM | POA: Diagnosis not present

## 2014-02-22 DIAGNOSIS — D509 Iron deficiency anemia, unspecified: Secondary | ICD-10-CM | POA: Diagnosis not present

## 2014-02-22 DIAGNOSIS — N2581 Secondary hyperparathyroidism of renal origin: Secondary | ICD-10-CM | POA: Diagnosis not present

## 2014-02-25 DIAGNOSIS — N2581 Secondary hyperparathyroidism of renal origin: Secondary | ICD-10-CM | POA: Diagnosis not present

## 2014-02-25 DIAGNOSIS — N186 End stage renal disease: Secondary | ICD-10-CM | POA: Diagnosis not present

## 2014-02-25 DIAGNOSIS — D509 Iron deficiency anemia, unspecified: Secondary | ICD-10-CM | POA: Diagnosis not present

## 2014-02-27 DIAGNOSIS — D509 Iron deficiency anemia, unspecified: Secondary | ICD-10-CM | POA: Diagnosis not present

## 2014-02-27 DIAGNOSIS — N2581 Secondary hyperparathyroidism of renal origin: Secondary | ICD-10-CM | POA: Diagnosis not present

## 2014-02-27 DIAGNOSIS — N186 End stage renal disease: Secondary | ICD-10-CM | POA: Diagnosis not present

## 2014-03-01 DIAGNOSIS — N186 End stage renal disease: Secondary | ICD-10-CM | POA: Diagnosis not present

## 2014-03-01 DIAGNOSIS — D509 Iron deficiency anemia, unspecified: Secondary | ICD-10-CM | POA: Diagnosis not present

## 2014-03-01 DIAGNOSIS — N2581 Secondary hyperparathyroidism of renal origin: Secondary | ICD-10-CM | POA: Diagnosis not present

## 2014-03-04 DIAGNOSIS — N2581 Secondary hyperparathyroidism of renal origin: Secondary | ICD-10-CM | POA: Diagnosis not present

## 2014-03-04 DIAGNOSIS — D509 Iron deficiency anemia, unspecified: Secondary | ICD-10-CM | POA: Diagnosis not present

## 2014-03-04 DIAGNOSIS — N186 End stage renal disease: Secondary | ICD-10-CM | POA: Diagnosis not present

## 2014-03-06 DIAGNOSIS — N2581 Secondary hyperparathyroidism of renal origin: Secondary | ICD-10-CM | POA: Diagnosis not present

## 2014-03-06 DIAGNOSIS — N186 End stage renal disease: Secondary | ICD-10-CM | POA: Diagnosis not present

## 2014-03-06 DIAGNOSIS — D509 Iron deficiency anemia, unspecified: Secondary | ICD-10-CM | POA: Diagnosis not present

## 2014-03-08 DIAGNOSIS — D509 Iron deficiency anemia, unspecified: Secondary | ICD-10-CM | POA: Diagnosis not present

## 2014-03-08 DIAGNOSIS — N2581 Secondary hyperparathyroidism of renal origin: Secondary | ICD-10-CM | POA: Diagnosis not present

## 2014-03-08 DIAGNOSIS — N186 End stage renal disease: Secondary | ICD-10-CM | POA: Diagnosis not present

## 2014-03-11 DIAGNOSIS — N186 End stage renal disease: Secondary | ICD-10-CM | POA: Diagnosis not present

## 2014-03-11 DIAGNOSIS — D509 Iron deficiency anemia, unspecified: Secondary | ICD-10-CM | POA: Diagnosis not present

## 2014-03-11 DIAGNOSIS — N2581 Secondary hyperparathyroidism of renal origin: Secondary | ICD-10-CM | POA: Diagnosis not present

## 2014-03-13 DIAGNOSIS — D509 Iron deficiency anemia, unspecified: Secondary | ICD-10-CM | POA: Diagnosis not present

## 2014-03-13 DIAGNOSIS — N2581 Secondary hyperparathyroidism of renal origin: Secondary | ICD-10-CM | POA: Diagnosis not present

## 2014-03-13 DIAGNOSIS — N186 End stage renal disease: Secondary | ICD-10-CM | POA: Diagnosis not present

## 2014-03-15 DIAGNOSIS — N2581 Secondary hyperparathyroidism of renal origin: Secondary | ICD-10-CM | POA: Diagnosis not present

## 2014-03-15 DIAGNOSIS — D509 Iron deficiency anemia, unspecified: Secondary | ICD-10-CM | POA: Diagnosis not present

## 2014-03-15 DIAGNOSIS — N186 End stage renal disease: Secondary | ICD-10-CM | POA: Diagnosis not present

## 2014-03-17 DIAGNOSIS — N186 End stage renal disease: Secondary | ICD-10-CM | POA: Diagnosis not present

## 2014-03-17 DIAGNOSIS — Z992 Dependence on renal dialysis: Secondary | ICD-10-CM | POA: Diagnosis not present

## 2014-03-18 DIAGNOSIS — D509 Iron deficiency anemia, unspecified: Secondary | ICD-10-CM | POA: Diagnosis not present

## 2014-03-18 DIAGNOSIS — N186 End stage renal disease: Secondary | ICD-10-CM | POA: Diagnosis not present

## 2014-03-18 DIAGNOSIS — N2581 Secondary hyperparathyroidism of renal origin: Secondary | ICD-10-CM | POA: Diagnosis not present

## 2014-03-19 DIAGNOSIS — I871 Compression of vein: Secondary | ICD-10-CM | POA: Diagnosis not present

## 2014-03-19 DIAGNOSIS — N186 End stage renal disease: Secondary | ICD-10-CM | POA: Diagnosis not present

## 2014-03-19 DIAGNOSIS — Z992 Dependence on renal dialysis: Secondary | ICD-10-CM | POA: Diagnosis not present

## 2014-03-19 DIAGNOSIS — T82858D Stenosis of vascular prosthetic devices, implants and grafts, subsequent encounter: Secondary | ICD-10-CM | POA: Diagnosis not present

## 2014-03-20 DIAGNOSIS — D509 Iron deficiency anemia, unspecified: Secondary | ICD-10-CM | POA: Diagnosis not present

## 2014-03-20 DIAGNOSIS — N186 End stage renal disease: Secondary | ICD-10-CM | POA: Diagnosis not present

## 2014-03-20 DIAGNOSIS — N2581 Secondary hyperparathyroidism of renal origin: Secondary | ICD-10-CM | POA: Diagnosis not present

## 2014-03-22 DIAGNOSIS — N186 End stage renal disease: Secondary | ICD-10-CM | POA: Diagnosis not present

## 2014-03-22 DIAGNOSIS — N2581 Secondary hyperparathyroidism of renal origin: Secondary | ICD-10-CM | POA: Diagnosis not present

## 2014-03-22 DIAGNOSIS — D509 Iron deficiency anemia, unspecified: Secondary | ICD-10-CM | POA: Diagnosis not present

## 2014-03-24 ENCOUNTER — Other Ambulatory Visit (INDEPENDENT_AMBULATORY_CARE_PROVIDER_SITE_OTHER): Payer: Self-pay | Admitting: *Deleted

## 2014-03-24 ENCOUNTER — Other Ambulatory Visit (INDEPENDENT_AMBULATORY_CARE_PROVIDER_SITE_OTHER): Payer: Self-pay

## 2014-03-24 DIAGNOSIS — N2581 Secondary hyperparathyroidism of renal origin: Secondary | ICD-10-CM | POA: Diagnosis not present

## 2014-03-24 DIAGNOSIS — E215 Disorder of parathyroid gland, unspecified: Secondary | ICD-10-CM

## 2014-03-25 DIAGNOSIS — D509 Iron deficiency anemia, unspecified: Secondary | ICD-10-CM | POA: Diagnosis not present

## 2014-03-25 DIAGNOSIS — N2581 Secondary hyperparathyroidism of renal origin: Secondary | ICD-10-CM | POA: Diagnosis not present

## 2014-03-25 DIAGNOSIS — N186 End stage renal disease: Secondary | ICD-10-CM | POA: Diagnosis not present

## 2014-03-27 DIAGNOSIS — N2581 Secondary hyperparathyroidism of renal origin: Secondary | ICD-10-CM | POA: Diagnosis not present

## 2014-03-27 DIAGNOSIS — D509 Iron deficiency anemia, unspecified: Secondary | ICD-10-CM | POA: Diagnosis not present

## 2014-03-27 DIAGNOSIS — N186 End stage renal disease: Secondary | ICD-10-CM | POA: Diagnosis not present

## 2014-03-29 DIAGNOSIS — N2581 Secondary hyperparathyroidism of renal origin: Secondary | ICD-10-CM | POA: Diagnosis not present

## 2014-03-29 DIAGNOSIS — N186 End stage renal disease: Secondary | ICD-10-CM | POA: Diagnosis not present

## 2014-03-29 DIAGNOSIS — D509 Iron deficiency anemia, unspecified: Secondary | ICD-10-CM | POA: Diagnosis not present

## 2014-03-31 ENCOUNTER — Encounter (HOSPITAL_COMMUNITY): Payer: Medicare Other

## 2014-03-31 ENCOUNTER — Encounter (HOSPITAL_COMMUNITY)
Admission: RE | Admit: 2014-03-31 | Discharge: 2014-03-31 | Disposition: A | Discharge status: Home / Self Care | Payer: Medicare Other | Source: Ambulatory Visit | Attending: Surgery | Admitting: Surgery

## 2014-03-31 DIAGNOSIS — E215 Disorder of parathyroid gland, unspecified: Secondary | ICD-10-CM | POA: Insufficient documentation

## 2014-03-31 DIAGNOSIS — E213 Hyperparathyroidism, unspecified: Secondary | ICD-10-CM | POA: Diagnosis not present

## 2014-03-31 MED ORDER — TECHNETIUM TC 99M SESTAMIBI GENERIC - CARDIOLITE
25.0000 | Freq: Once | INTRAVENOUS | Status: AC | PRN
  Administered 2014-03-31: 25 via INTRAVENOUS

## 2014-04-01 DIAGNOSIS — N186 End stage renal disease: Secondary | ICD-10-CM | POA: Diagnosis not present

## 2014-04-01 DIAGNOSIS — N2581 Secondary hyperparathyroidism of renal origin: Secondary | ICD-10-CM | POA: Diagnosis not present

## 2014-04-01 DIAGNOSIS — D509 Iron deficiency anemia, unspecified: Secondary | ICD-10-CM | POA: Diagnosis not present

## 2014-04-03 DIAGNOSIS — D509 Iron deficiency anemia, unspecified: Secondary | ICD-10-CM | POA: Diagnosis not present

## 2014-04-03 DIAGNOSIS — N2581 Secondary hyperparathyroidism of renal origin: Secondary | ICD-10-CM | POA: Diagnosis not present

## 2014-04-03 DIAGNOSIS — N186 End stage renal disease: Secondary | ICD-10-CM | POA: Diagnosis not present

## 2014-04-05 DIAGNOSIS — N186 End stage renal disease: Secondary | ICD-10-CM | POA: Diagnosis not present

## 2014-04-05 DIAGNOSIS — N2581 Secondary hyperparathyroidism of renal origin: Secondary | ICD-10-CM | POA: Diagnosis not present

## 2014-04-05 DIAGNOSIS — D509 Iron deficiency anemia, unspecified: Secondary | ICD-10-CM | POA: Diagnosis not present

## 2014-04-08 ENCOUNTER — Other Ambulatory Visit: Payer: Self-pay | Admitting: Surgery

## 2014-04-08 DIAGNOSIS — D509 Iron deficiency anemia, unspecified: Secondary | ICD-10-CM | POA: Diagnosis not present

## 2014-04-08 DIAGNOSIS — N2581 Secondary hyperparathyroidism of renal origin: Secondary | ICD-10-CM | POA: Diagnosis not present

## 2014-04-08 DIAGNOSIS — E042 Nontoxic multinodular goiter: Secondary | ICD-10-CM

## 2014-04-08 DIAGNOSIS — N186 End stage renal disease: Secondary | ICD-10-CM | POA: Diagnosis not present

## 2014-04-09 ENCOUNTER — Other Ambulatory Visit: Payer: Self-pay | Admitting: Surgery

## 2014-04-09 DIAGNOSIS — E042 Nontoxic multinodular goiter: Secondary | ICD-10-CM

## 2014-04-10 DIAGNOSIS — N2581 Secondary hyperparathyroidism of renal origin: Secondary | ICD-10-CM | POA: Diagnosis not present

## 2014-04-10 DIAGNOSIS — N186 End stage renal disease: Secondary | ICD-10-CM | POA: Diagnosis not present

## 2014-04-10 DIAGNOSIS — D509 Iron deficiency anemia, unspecified: Secondary | ICD-10-CM | POA: Diagnosis not present

## 2014-04-11 ENCOUNTER — Ambulatory Visit
Admission: RE | Admit: 2014-04-11 | Discharge: 2014-04-11 | Disposition: A | Discharge status: Home / Self Care | Payer: Medicare Other | Source: Ambulatory Visit | Attending: Surgery | Admitting: Surgery

## 2014-04-11 DIAGNOSIS — D351 Benign neoplasm of parathyroid gland: Secondary | ICD-10-CM | POA: Diagnosis not present

## 2014-04-12 DIAGNOSIS — N2581 Secondary hyperparathyroidism of renal origin: Secondary | ICD-10-CM | POA: Diagnosis not present

## 2014-04-12 DIAGNOSIS — D509 Iron deficiency anemia, unspecified: Secondary | ICD-10-CM | POA: Diagnosis not present

## 2014-04-12 DIAGNOSIS — N186 End stage renal disease: Secondary | ICD-10-CM | POA: Diagnosis not present

## 2014-04-15 ENCOUNTER — Ambulatory Visit: Payer: Self-pay | Admitting: Surgery

## 2014-04-15 DIAGNOSIS — N2581 Secondary hyperparathyroidism of renal origin: Secondary | ICD-10-CM | POA: Diagnosis not present

## 2014-04-15 DIAGNOSIS — N186 End stage renal disease: Secondary | ICD-10-CM | POA: Diagnosis not present

## 2014-04-15 DIAGNOSIS — D509 Iron deficiency anemia, unspecified: Secondary | ICD-10-CM | POA: Diagnosis not present

## 2014-04-17 DIAGNOSIS — N2581 Secondary hyperparathyroidism of renal origin: Secondary | ICD-10-CM | POA: Diagnosis not present

## 2014-04-17 DIAGNOSIS — N033 Chronic nephritic syndrome with diffuse mesangial proliferative glomerulonephritis: Secondary | ICD-10-CM | POA: Diagnosis not present

## 2014-04-17 DIAGNOSIS — Z992 Dependence on renal dialysis: Secondary | ICD-10-CM | POA: Diagnosis not present

## 2014-04-17 DIAGNOSIS — N186 End stage renal disease: Secondary | ICD-10-CM | POA: Diagnosis not present

## 2014-04-17 DIAGNOSIS — D509 Iron deficiency anemia, unspecified: Secondary | ICD-10-CM | POA: Diagnosis not present

## 2014-04-19 DIAGNOSIS — N2581 Secondary hyperparathyroidism of renal origin: Secondary | ICD-10-CM | POA: Diagnosis not present

## 2014-04-19 DIAGNOSIS — N186 End stage renal disease: Secondary | ICD-10-CM | POA: Diagnosis not present

## 2014-04-19 DIAGNOSIS — D509 Iron deficiency anemia, unspecified: Secondary | ICD-10-CM | POA: Diagnosis not present

## 2014-04-22 ENCOUNTER — Ambulatory Visit
Admission: RE | Admit: 2014-04-22 | Discharge: 2014-04-22 | Disposition: A | Discharge status: Home / Self Care | Payer: Medicare Other | Source: Ambulatory Visit | Attending: Nephrology | Admitting: Nephrology

## 2014-04-22 ENCOUNTER — Other Ambulatory Visit: Payer: Self-pay | Admitting: Nephrology

## 2014-04-22 DIAGNOSIS — N2581 Secondary hyperparathyroidism of renal origin: Secondary | ICD-10-CM | POA: Diagnosis not present

## 2014-04-22 DIAGNOSIS — R0602 Shortness of breath: Secondary | ICD-10-CM | POA: Diagnosis not present

## 2014-04-22 DIAGNOSIS — N186 End stage renal disease: Secondary | ICD-10-CM | POA: Diagnosis not present

## 2014-04-22 DIAGNOSIS — Z789 Other specified health status: Secondary | ICD-10-CM

## 2014-04-22 DIAGNOSIS — D509 Iron deficiency anemia, unspecified: Secondary | ICD-10-CM | POA: Diagnosis not present

## 2014-04-24 DIAGNOSIS — D509 Iron deficiency anemia, unspecified: Secondary | ICD-10-CM | POA: Diagnosis not present

## 2014-04-24 DIAGNOSIS — N186 End stage renal disease: Secondary | ICD-10-CM | POA: Diagnosis not present

## 2014-04-24 DIAGNOSIS — N2581 Secondary hyperparathyroidism of renal origin: Secondary | ICD-10-CM | POA: Diagnosis not present

## 2014-04-25 DIAGNOSIS — N186 End stage renal disease: Secondary | ICD-10-CM | POA: Diagnosis not present

## 2014-04-25 DIAGNOSIS — N2581 Secondary hyperparathyroidism of renal origin: Secondary | ICD-10-CM | POA: Diagnosis not present

## 2014-04-25 DIAGNOSIS — D509 Iron deficiency anemia, unspecified: Secondary | ICD-10-CM | POA: Diagnosis not present

## 2014-04-29 DIAGNOSIS — D509 Iron deficiency anemia, unspecified: Secondary | ICD-10-CM | POA: Diagnosis not present

## 2014-04-29 DIAGNOSIS — N2581 Secondary hyperparathyroidism of renal origin: Secondary | ICD-10-CM | POA: Diagnosis not present

## 2014-04-29 DIAGNOSIS — N186 End stage renal disease: Secondary | ICD-10-CM | POA: Diagnosis not present

## 2014-05-01 DIAGNOSIS — N186 End stage renal disease: Secondary | ICD-10-CM | POA: Diagnosis not present

## 2014-05-01 DIAGNOSIS — N2581 Secondary hyperparathyroidism of renal origin: Secondary | ICD-10-CM | POA: Diagnosis not present

## 2014-05-01 DIAGNOSIS — D509 Iron deficiency anemia, unspecified: Secondary | ICD-10-CM | POA: Diagnosis not present

## 2014-05-03 DIAGNOSIS — N186 End stage renal disease: Secondary | ICD-10-CM | POA: Diagnosis not present

## 2014-05-03 DIAGNOSIS — N2581 Secondary hyperparathyroidism of renal origin: Secondary | ICD-10-CM | POA: Diagnosis not present

## 2014-05-03 DIAGNOSIS — D509 Iron deficiency anemia, unspecified: Secondary | ICD-10-CM | POA: Diagnosis not present

## 2014-05-03 NOTE — Pre-Procedure Instructions (Signed)
[name]  05/03/2014   Your procedure is scheduled on:  April 25  Report to [facility] Admitting at 05:30 AM.  Call this number if you have problems the morning of surgery: [phone]   Remember:   Do not eat food or drink liquids after midnight.   Take these medicines the morning of surgery with A SIP OF WATER: Metoprolol   STOP/ Do not take Aspirin, Aleve, Naproxen, Advil, Ibuprofen, Motrin, Vitamins, Herbs, or Supplements starting today   Do not wear jewelry, make-up or nail polish.  Do not wear lotions, powders, or perfumes. You may wear deodorant.  Do not shave 48 hours prior to surgery. Men may shave face and neck.  Do not bring valuables to the hospital.  [facility] is not responsible for any belongings or valuables.               Contacts, dentures or bridgework may not be worn into surgery.  Leave suitcase in the car. After surgery it may be brought to your room.  For patients admitted to the hospital, discharge time is determined by your treatment team.               Special Instructions: [place] - Preparing for Surgery  Before surgery, you can play an important role.  Because skin is not sterile, your skin needs to be as free of germs as possible.  You can reduce the number of germs on you skin by washing with CHG (chlorahexidine gluconate) soap before surgery.  CHG is an antiseptic cleaner which kills germs and bonds with the skin to continue killing germs even after washing.  Please DO NOT use if you have an allergy to CHG or antibacterial soaps.  If your skin becomes reddened/irritated stop using the CHG and inform your nurse when you arrive at Short Stay.  Do not shave (including legs and underarms) for at least 48 hours prior to the first CHG shower.  You may shave your face.  Please follow these instructions carefully:   1.  Shower with CHG Soap the night before surgery and the morning of Surgery.  2.  If you choose to wash your hair, wash your  hair first as usual with your normal shampoo.  3.  After you shampoo, rinse your hair and body thoroughly to remove the shampoo.  4.  Use CHG as you would any other liquid soap.  You can apply CHG directly to the skin and wash gently with scrungie or a clean washcloth.  5.  Apply the CHG Soap to your body ONLY FROM THE NECK DOWN.  Do not use on open wounds or open sores.  Avoid contact with your eyes, ears, mouth and genitals (private parts).  Wash genitals (private parts) with your normal soap.  6.  Wash thoroughly, paying special attention to the area where your surgery will be performed.  7.  Thoroughly rinse your body with warm water from the neck down.  8.  DO NOT shower/wash with your normal soap after using and rinsing off the CHG Soap.  9.  Pat yourself dry with a clean towel.            10.  Wear clean pajamas.            11.  Place clean sheets on your bed the night of your first shower and do not sleep with pets.  Day of Surgery  Do not apply any lotions the morning of  surgery.  Please wear clean clothes to the hospital/surgery center.     Please read over the following fact sheets that you were given: Pain Booklet, Coughing and Deep Breathing and Surgical Site Infection Prevention

## 2014-05-05 ENCOUNTER — Encounter (HOSPITAL_COMMUNITY): Payer: Self-pay

## 2014-05-05 ENCOUNTER — Encounter (HOSPITAL_COMMUNITY)
Admission: RE | Admit: 2014-05-05 | Discharge: 2014-05-05 | Disposition: A | Discharge status: Home / Self Care | Payer: Medicare Other | Source: Ambulatory Visit | Attending: Surgery | Admitting: Surgery

## 2014-05-05 DIAGNOSIS — Z01818 Encounter for other preprocedural examination: Secondary | ICD-10-CM | POA: Diagnosis not present

## 2014-05-05 NOTE — Progress Notes (Signed)
Anesthesia Chart Review:  Pt is 54 year old female scheduled for parathyroidectomy and neck exploration on 05/12/2014 with Dr. Harlow Asa.   PMH includes: HTN, ESRD on dialysis, anemia, pancreatitis. Current smoker. BMI 23. S/p D&C 04/17/13.   No preoperative labs obtained. Obtained labs from 04/10/14 from pt's dialysis center [facility]). BUN/Cr elevated as expected of pt on dialysis. Otherwise, labs acceptable for surgery. Will get CBC and BMET DOS.   EKG 06/12/2013: SR, PAC, LVH, T wave inversion 3, f per Dr. Kyla Balzarine interpretation.   Saw Dr. Johnsie Cancel with cardiology 05/2013 for tachycardia; felt to be due to flow rate during dialysis.   Stress echo 02/15/2013 (in care everywhere, done as part of work up to be on transplant list): ECG REST: The baseline ECG displays normal sinus rhythm. Early Repolarization.  ECG STRESS: No diagnostic ST segment changes were seen. Negative stress ECG for inducible ischemia at heart rate achieved. ECG changes, blood pressure and heart rate responses during stress test are shown in the Cardiology Scan portion of this report in the EPIC. Frequent PACs noted.   REST ECHO: Normal left ventricular function at rest. There was normal left ventricular wall motion at rest. The estimated LV ejection fraction is 60-65% . Left ventricular diastolic function: Pseudonormalization.  STRESS ECHO: There was normal increase in global LV function post exercise. The estimated LV ejection fraction is >70% with stress. Negative exercise echocardiography for inducible ischemia at subtarget heart rate achieved.   If labs acceptable DOS, I anticipate pt can proceed with surgery.   Willeen Cass, FNP-BC [facility] Short Stay [facility]/Anesthesiology Phone: [phone] 05/05/2014 4:48 PM

## 2014-05-06 DIAGNOSIS — D509 Iron deficiency anemia, unspecified: Secondary | ICD-10-CM | POA: Diagnosis not present

## 2014-05-06 DIAGNOSIS — N186 End stage renal disease: Secondary | ICD-10-CM | POA: Diagnosis not present

## 2014-05-06 DIAGNOSIS — N2581 Secondary hyperparathyroidism of renal origin: Secondary | ICD-10-CM | POA: Diagnosis not present

## 2014-05-08 DIAGNOSIS — N2581 Secondary hyperparathyroidism of renal origin: Secondary | ICD-10-CM | POA: Diagnosis not present

## 2014-05-08 DIAGNOSIS — D509 Iron deficiency anemia, unspecified: Secondary | ICD-10-CM | POA: Diagnosis not present

## 2014-05-08 DIAGNOSIS — N186 End stage renal disease: Secondary | ICD-10-CM | POA: Diagnosis not present

## 2014-05-10 DIAGNOSIS — N186 End stage renal disease: Secondary | ICD-10-CM | POA: Diagnosis not present

## 2014-05-10 DIAGNOSIS — N2581 Secondary hyperparathyroidism of renal origin: Secondary | ICD-10-CM | POA: Diagnosis not present

## 2014-05-10 DIAGNOSIS — D509 Iron deficiency anemia, unspecified: Secondary | ICD-10-CM | POA: Diagnosis not present

## 2014-05-11 ENCOUNTER — Encounter (HOSPITAL_COMMUNITY): Payer: Self-pay | Admitting: Surgery

## 2014-05-11 DIAGNOSIS — N2581 Secondary hyperparathyroidism of renal origin: Secondary | ICD-10-CM | POA: Diagnosis present

## 2014-05-11 MED ORDER — CIPROFLOXACIN IN D5W 400 MG/200ML IV SOLN
400.0000 mg | INTRAVENOUS | Status: AC
  Administered 2014-05-12: 400 mg via INTRAVENOUS
  Filled 2014-05-11: qty 200

## 2014-05-11 NOTE — H&P (Signed)
General Surgery [facility]  Karalynn L. Tendler DOB: [birth year] Divorced / Language: English / Race: Black or African American Female  History of Present Illness  The patient is a 54 year old female who presents with secondary hyperparathyroidism. Patient is referred by Dr. Vanetta Mulders for evaluation of recurrent secondary hyperparathyroidism. Patient has been on hemodialysis for approximately 30 years. 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 Patient has had a previous neck exploration and total parathyroidectomy with autotransplantation to the left forearm. This was performed by my partner, Dr. Benard Rink, approximately 15-20 years ago. Patient now has recurrent secondary hyperparathyroidism. Calcium level is 10.1 with a calcium times phosphorous product of 69. Intact PTH level is markedly elevated at greater than 3200. Patient has not had any particular complications. She dialyzes on Tuesdays Thursdays and Saturdays at the [facility]. She presents today for further evaluation and recommendations for management of recurrent secondary hyperparathyroidism.   Other Problems Chronic Renal Failure Syndrome High blood pressure  Past Surgical History Breast Biopsy Left. Colon Polyp Removal - Colonoscopy Dialysis Shunt / Fistula Thyroid Surgery  Diagnostic Studies History Colonoscopy within last year Mammogram within last year Pap Smear 1-5 years ago  Allergies No Known Drug Allergies03/07/2014  Medication History AmLODIPine Besylate (5MG  Tablet, Oral) Active. Renvela (800MG  Tablet, Oral) Active. Medications Reconciled  Social History Alcohol use Occasional alcohol use. Caffeine use Coffee, Tea. No drug use Tobacco use Current every day smoker.  Family History Colon Polyps Mother. Prostate Cancer Father.  Pregnancy / Birth History Age at menarche 36  years. Age of menopause 57-50 Gravida 1 Irregular periods Maternal age 90-20 Para 1  Review of Systems General Not Present- Appetite Loss, Chills, Fatigue, Fever, Night Sweats, Weight Gain and Weight Loss. Skin Present- Dryness. Not Present- Change in Wart/Mole, Hives, Jaundice, New Lesions, Non-Healing Wounds, Rash and Ulcer. HEENT Present- Wears glasses/contact lenses. Not Present- Earache, Hearing Loss, Hoarseness, Nose Bleed, Oral Ulcers, Ringing in the Ears, Seasonal Allergies, Sinus Pain, Sore Throat, Visual Disturbances and Yellow Eyes. Respiratory Not Present- Bloody sputum, Chronic Cough, Difficulty Breathing, Snoring and Wheezing. Breast Not Present- Breast Mass, Breast Pain, Nipple Discharge and Skin Changes. Cardiovascular Not Present- Chest Pain, Difficulty Breathing Lying Down, Leg Cramps, Palpitations, Rapid Heart Rate, Shortness of Breath and Swelling of Extremities. Gastrointestinal Not Present- Abdominal Pain, Bloating, Bloody Stool, Change in Bowel Habits, Chronic diarrhea, Constipation, Difficulty Swallowing, Excessive gas, Gets full quickly at meals, Hemorrhoids, Indigestion, Nausea, Rectal Pain and Vomiting. Female Genitourinary Not Present- Frequency, Nocturia, Painful Urination, Pelvic Pain and Urgency. Musculoskeletal Not Present- Back Pain, Joint Pain, Joint Stiffness, Muscle Pain, Muscle Weakness and Swelling of Extremities. Neurological Not Present- Decreased Memory, Fainting, Headaches, Numbness, Seizures, Tingling, Tremor, Trouble walking and Weakness. Psychiatric Not Present- Anxiety, Bipolar, Change in Sleep Pattern, Depression, Fearful and Frequent crying. Endocrine Not Present- Cold Intolerance, Excessive Hunger, Hair Changes, Heat Intolerance, Hot flashes and New Diabetes. Hematology Not Present- Easy Bruising, Excessive bleeding, Gland problems, HIV and Persistent Infections.   Vitals  03/24/2014 10:10 AM Weight: 146 lb Height: 67in Body Surface  Area: 1.77 m Body Mass Index: 22.87 kg/m Temp.: 97.32F(Temporal)  Pulse: 79 (Regular)  BP: 132/80 (Sitting, Left Arm, Standard)    Physical Exam  General - appears comfortable, no distress; not diaphorectic  HEENT - normocephalic; sclerae clear, gaze conjugate; mucous membranes moist, dentition good; voice normal  Neck - symmetric on extension; no palpable anterior or  posterior cervical adenopathy; no palpable masses in the thyroid bed; well-healed low Kocher incision  Chest - clear bilaterally with rhonchi, rales, or wheeze  Cor - regular rhythm with normal rate; no significant murmur  Ext - non-tender without significant edema or lymphedema; old nonfunctioning graft left upper extremity; current patent vascular access graft right upper extremity  Neuro - grossly intact; no tremor    Assessment & Plan  SECONDARY HYPERPARATHYROIDISM, RENAL (588.81  N25.81)  Patient has recurrent secondary hyperparathyroidism. She has previously undergone total parathyroidectomy with autotransplantation to the left forearm.  In preparation for surgery, a nuclear medicine parathyroid scan shows activity in the right thyroid bed.  An ultrasound shows a 2.8 cm mass posterior to the right thyroid lobe, representing either an incomplete first surgery, or an extra-numerary gland.  Plan neck exploration and parathyroidectomy.  The risks and benefits of the procedure have been discussed at length with the patient.  The patient understands the proposed procedure, potential alternative treatments, and the course of recovery to be expected.  All of the patient's questions have been answered at this time.  The patient wishes to proceed with surgery.  Earnstine Regal, MD, [facility] Office: [phone]

## 2014-05-12 ENCOUNTER — Inpatient Hospital Stay (HOSPITAL_COMMUNITY): Payer: Medicare Other | Admitting: Emergency Medicine

## 2014-05-12 ENCOUNTER — Observation Stay (HOSPITAL_COMMUNITY)
Admission: RE | Admit: 2014-05-12 | Discharge: 2014-05-17 | Disposition: A | Discharge status: Home / Self Care | Payer: Medicare Other | Source: Ambulatory Visit | Attending: Surgery | Admitting: Surgery

## 2014-05-12 ENCOUNTER — Encounter (HOSPITAL_COMMUNITY)
Admission: RE | Disposition: A | Discharge status: Home / Self Care | Payer: Self-pay | Source: Ambulatory Visit | Attending: Surgery

## 2014-05-12 ENCOUNTER — Inpatient Hospital Stay (HOSPITAL_COMMUNITY): Payer: Medicare Other | Admitting: Certified Registered"

## 2014-05-12 ENCOUNTER — Encounter (HOSPITAL_COMMUNITY): Payer: Self-pay | Admitting: Surgery

## 2014-05-12 DIAGNOSIS — N2581 Secondary hyperparathyroidism of renal origin: Secondary | ICD-10-CM | POA: Diagnosis not present

## 2014-05-12 DIAGNOSIS — I120 Hypertensive chronic kidney disease with stage 5 chronic kidney disease or end stage renal disease: Secondary | ICD-10-CM | POA: Diagnosis not present

## 2014-05-12 DIAGNOSIS — Z79899 Other long term (current) drug therapy: Secondary | ICD-10-CM | POA: Diagnosis not present

## 2014-05-12 DIAGNOSIS — F419 Anxiety disorder, unspecified: Secondary | ICD-10-CM | POA: Diagnosis not present

## 2014-05-12 DIAGNOSIS — N186 End stage renal disease: Secondary | ICD-10-CM | POA: Diagnosis not present

## 2014-05-12 DIAGNOSIS — T82898A Other specified complication of vascular prosthetic devices, implants and grafts, initial encounter: Secondary | ICD-10-CM | POA: Diagnosis present

## 2014-05-12 DIAGNOSIS — Z992 Dependence on renal dialysis: Secondary | ICD-10-CM | POA: Diagnosis not present

## 2014-05-12 DIAGNOSIS — F17200 Nicotine dependence, unspecified, uncomplicated: Secondary | ICD-10-CM | POA: Insufficient documentation

## 2014-05-12 DIAGNOSIS — F22 Delusional disorders: Secondary | ICD-10-CM

## 2014-05-12 DIAGNOSIS — E049 Nontoxic goiter, unspecified: Secondary | ICD-10-CM | POA: Diagnosis not present

## 2014-05-12 DIAGNOSIS — D649 Anemia, unspecified: Secondary | ICD-10-CM | POA: Diagnosis not present

## 2014-05-12 HISTORY — DX: Dependence on renal dialysis: N18.6

## 2014-05-12 HISTORY — DX: Dependence on renal dialysis: Z99.2

## 2014-05-12 HISTORY — DX: Secondary hyperparathyroidism of renal origin: N25.81

## 2014-05-12 HISTORY — DX: Claustrophobia: F40.240

## 2014-05-12 HISTORY — PX: PARATHYROIDECTOMY: SHX19

## 2014-05-12 LAB — RENAL FUNCTION PANEL
ALBUMIN: 3.3 g/dL — AB (ref 3.5–5.2)
ANION GAP: 16 — AB (ref 5–15)
BUN: 37 mg/dL — ABNORMAL HIGH (ref 6–23)
CHLORIDE: 95 mmol/L — AB (ref 96–112)
CO2: 23 mmol/L (ref 19–32)
Calcium: 7.6 mg/dL — ABNORMAL LOW (ref 8.4–10.5)
Creatinine, Ser: 9.9 mg/dL — ABNORMAL HIGH (ref 0.50–1.10)
GFR calc Af Amer: 5 mL/min — ABNORMAL LOW (ref >90)
GFR calc non Af Amer: 4 mL/min — ABNORMAL LOW (ref >90)
Glucose, Bld: 96 mg/dL (ref 70–99)
Phosphorus: 6.4 mg/dL — ABNORMAL HIGH (ref 2.3–4.6)
Potassium: 4.2 mmol/L (ref 3.5–5.1)
Sodium: 134 mmol/L — ABNORMAL LOW (ref 135–145)

## 2014-05-12 LAB — CBC
HEMATOCRIT: 31.8 % — AB (ref 36.0–46.0)
Hemoglobin: 10.6 g/dL — ABNORMAL LOW (ref 12.0–15.0)
MCH: 29.9 pg (ref 26.0–34.0)
MCHC: 33.3 g/dL (ref 30.0–36.0)
MCV: 89.8 fL (ref 78.0–100.0)
PLATELETS: 219 10*3/uL (ref 150–400)
RBC: 3.54 MIL/uL — ABNORMAL LOW (ref 3.87–5.11)
RDW: 14 % (ref 11.5–15.5)
WBC: 5.5 10*3/uL (ref 4.0–10.5)

## 2014-05-12 LAB — BASIC METABOLIC PANEL
Anion gap: 16 — ABNORMAL HIGH (ref 5–15)
BUN: 33 mg/dL — AB (ref 6–23)
CHLORIDE: 92 mmol/L — AB (ref 96–112)
CO2: 24 mmol/L (ref 19–32)
Calcium: 9.5 mg/dL (ref 8.4–10.5)
Creatinine, Ser: 9.05 mg/dL — ABNORMAL HIGH (ref 0.50–1.10)
GFR calc Af Amer: 5 mL/min — ABNORMAL LOW (ref >90)
GFR, EST NON AFRICAN AMERICAN: 4 mL/min — AB (ref >90)
Glucose, Bld: 82 mg/dL (ref 70–99)
Potassium: 3.6 mmol/L (ref 3.5–5.1)
SODIUM: 132 mmol/L — AB (ref 135–145)

## 2014-05-12 SURGERY — PARATHYROIDECTOMY
Anesthesia: General | Site: Neck

## 2014-05-12 MED ORDER — METOPROLOL TARTRATE 25 MG PO TABS
25.0000 mg | ORAL_TABLET | Freq: Every day | ORAL | Status: DC
  Administered 2014-05-14: 25 mg via ORAL
  Filled 2014-05-12 (×3): qty 1

## 2014-05-12 MED ORDER — ALPRAZOLAM 0.25 MG PO TABS: 0.2500 mg | ORAL_TABLET | Freq: Every evening | ORAL | Status: DC | PRN

## 2014-05-12 MED ORDER — PROPOFOL 10 MG/ML IV BOLUS
INTRAVENOUS | Status: DC | PRN
  Administered 2014-05-12: 150 mg via INTRAVENOUS
  Administered 2014-05-12: 50 mg via INTRAVENOUS

## 2014-05-12 MED ORDER — ROCURONIUM BROMIDE 100 MG/10ML IV SOLN
INTRAVENOUS | Status: DC | PRN
  Administered 2014-05-12: 25 mg via INTRAVENOUS

## 2014-05-12 MED ORDER — SODIUM CHLORIDE 0.9 % IV SOLN
INTRAVENOUS | Status: DC
  Administered 2014-05-12: 10:00:00 via INTRAVENOUS

## 2014-05-12 MED ORDER — FENTANYL CITRATE (PF) 100 MCG/2ML IJ SOLN
25.0000 ug | INTRAMUSCULAR | Status: DC | PRN
  Administered 2014-05-12 (×4): 25 ug via INTRAVENOUS

## 2014-05-12 MED ORDER — GLYCOPYRROLATE 0.2 MG/ML IJ SOLN
INTRAMUSCULAR | Status: AC
Start: 2014-05-12 — End: 2014-05-12
  Filled 2014-05-12: qty 2

## 2014-05-12 MED ORDER — DOXERCALCIFEROL 4 MCG/2ML IV SOLN: 7.0000 ug | INTRAVENOUS | Status: DC

## 2014-05-12 MED ORDER — LIDOCAINE HCL (CARDIAC) 20 MG/ML IV SOLN
INTRAVENOUS | Status: DC | PRN
  Administered 2014-05-12: 60 mg via INTRAVENOUS

## 2014-05-12 MED ORDER — ENSURE ENLIVE PO LIQD
237.0000 mL | Freq: Two times a day (BID) | ORAL | Status: DC
  Administered 2014-05-15: 237 mL via ORAL

## 2014-05-12 MED ORDER — ONDANSETRON HCL 4 MG/2ML IJ SOLN
INTRAMUSCULAR | Status: AC
  Filled 2014-05-12: qty 2

## 2014-05-12 MED ORDER — ONDANSETRON HCL 4 MG/2ML IJ SOLN
INTRAMUSCULAR | Status: DC | PRN
  Administered 2014-05-12: 4 mg via INTRAVENOUS

## 2014-05-12 MED ORDER — BUPIVACAINE HCL (PF) 0.25 % IJ SOLN
INTRAMUSCULAR | Status: DC | PRN
  Administered 2014-05-12: 30 mL

## 2014-05-12 MED ORDER — SODIUM CHLORIDE 0.9 % IV SOLN
INTRAVENOUS | Status: DC | PRN
  Administered 2014-05-12: 07:00:00 via INTRAVENOUS

## 2014-05-12 MED ORDER — ARTIFICIAL TEARS OP OINT
TOPICAL_OINTMENT | OPHTHALMIC | Status: AC
  Filled 2014-05-12: qty 3.5

## 2014-05-12 MED ORDER — FENTANYL CITRATE (PF) 100 MCG/2ML IJ SOLN
INTRAMUSCULAR | Status: AC
  Administered 2014-05-12: 25 ug via INTRAVENOUS
  Filled 2014-05-12: qty 2

## 2014-05-12 MED ORDER — FENTANYL CITRATE (PF) 100 MCG/2ML IJ SOLN
INTRAMUSCULAR | Status: DC | PRN
  Administered 2014-05-12: 100 ug via INTRAVENOUS
  Administered 2014-05-12: 50 ug via INTRAVENOUS

## 2014-05-12 MED ORDER — ARTIFICIAL TEARS OP OINT
TOPICAL_OINTMENT | OPHTHALMIC | Status: DC | PRN
  Administered 2014-05-12: 1 via OPHTHALMIC

## 2014-05-12 MED ORDER — 0.9 % SODIUM CHLORIDE (POUR BTL) OPTIME
TOPICAL | Status: DC | PRN
  Administered 2014-05-12: 1000 mL

## 2014-05-12 MED ORDER — MIDAZOLAM HCL 2 MG/2ML IJ SOLN
INTRAMUSCULAR | Status: AC
  Filled 2014-05-12: qty 2

## 2014-05-12 MED ORDER — NEOSTIGMINE METHYLSULFATE 10 MG/10ML IV SOLN
INTRAVENOUS | Status: AC
Start: 2014-05-12 — End: 2014-05-12
  Filled 2014-05-12: qty 1

## 2014-05-12 MED ORDER — BUPIVACAINE HCL (PF) 0.25 % IJ SOLN
INTRAMUSCULAR | Status: AC
  Filled 2014-05-12: qty 30

## 2014-05-12 MED ORDER — ONDANSETRON HCL 4 MG PO TABS
4.0000 mg | ORAL_TABLET | Freq: Four times a day (QID) | ORAL | Status: DC | PRN
  Filled 2014-05-12: qty 1

## 2014-05-12 MED ORDER — EPHEDRINE SULFATE 50 MG/ML IJ SOLN
INTRAMUSCULAR | Status: AC
Start: 2014-05-12 — End: 2014-05-12
  Filled 2014-05-12: qty 1

## 2014-05-12 MED ORDER — NEOSTIGMINE METHYLSULFATE 10 MG/10ML IV SOLN
INTRAVENOUS | Status: DC | PRN
  Administered 2014-05-12: 2.5 mg via INTRAVENOUS

## 2014-05-12 MED ORDER — PROMETHAZINE HCL 25 MG/ML IJ SOLN
12.5000 mg | Freq: Four times a day (QID) | INTRAMUSCULAR | Status: DC | PRN
  Administered 2014-05-12: 12.5 mg via INTRAVENOUS
  Filled 2014-05-12: qty 1

## 2014-05-12 MED ORDER — PROPOFOL 10 MG/ML IV BOLUS
INTRAVENOUS | Status: AC
  Filled 2014-05-12: qty 20

## 2014-05-12 MED ORDER — PHENYLEPHRINE HCL 10 MG/ML IJ SOLN
INTRAMUSCULAR | Status: DC | PRN
  Administered 2014-05-12 (×2): 80 ug via INTRAVENOUS

## 2014-05-12 MED ORDER — MIDAZOLAM HCL 5 MG/5ML IJ SOLN
INTRAMUSCULAR | Status: DC | PRN
  Administered 2014-05-12: 1 mg via INTRAVENOUS

## 2014-05-12 MED ORDER — FENTANYL CITRATE (PF) 250 MCG/5ML IJ SOLN
INTRAMUSCULAR | Status: AC
  Filled 2014-05-12: qty 5

## 2014-05-12 MED ORDER — SODIUM CHLORIDE 0.9 % IJ SOLN
INTRAMUSCULAR | Status: AC
  Filled 2014-05-12: qty 10

## 2014-05-12 MED ORDER — SODIUM CHLORIDE 0.9 % IV SOLN
62.5000 mg | INTRAVENOUS | Status: DC
  Administered 2014-05-16: 62.5 mg via INTRAVENOUS
  Filled 2014-05-12 (×2): qty 5

## 2014-05-12 MED ORDER — LIDOCAINE HCL (CARDIAC) 20 MG/ML IV SOLN
INTRAVENOUS | Status: AC
  Filled 2014-05-12: qty 5

## 2014-05-12 MED ORDER — ONDANSETRON HCL 4 MG/2ML IJ SOLN
4.0000 mg | Freq: Four times a day (QID) | INTRAMUSCULAR | Status: DC | PRN
  Administered 2014-05-12: 4 mg via INTRAVENOUS
  Filled 2014-05-12 (×2): qty 2

## 2014-05-12 MED ORDER — HYDROCODONE-ACETAMINOPHEN 5-325 MG PO TABS
1.0000 | ORAL_TABLET | ORAL | Status: DC | PRN
  Administered 2014-05-13 (×3): 2 via ORAL
  Administered 2014-05-14: 1 via ORAL
  Administered 2014-05-14: 2 via ORAL
  Administered 2014-05-14: 1 via ORAL
  Administered 2014-05-15 – 2014-05-16 (×2): 2 via ORAL
  Filled 2014-05-12 (×3): qty 2
  Filled 2014-05-12: qty 1
  Filled 2014-05-12: qty 2
  Filled 2014-05-12: qty 1
  Filled 2014-05-12 (×2): qty 2

## 2014-05-12 MED ORDER — ROCURONIUM BROMIDE 50 MG/5ML IV SOLN
INTRAVENOUS | Status: AC
  Filled 2014-05-12: qty 1

## 2014-05-12 MED ORDER — SUCCINYLCHOLINE CHLORIDE 20 MG/ML IJ SOLN
INTRAMUSCULAR | Status: AC
  Filled 2014-05-12: qty 1

## 2014-05-12 MED ORDER — HEMOSTATIC AGENTS (NO CHARGE) OPTIME
TOPICAL | Status: DC | PRN
  Administered 2014-05-12: 1 via TOPICAL

## 2014-05-12 MED ORDER — HYDROMORPHONE HCL 1 MG/ML IJ SOLN
1.0000 mg | INTRAMUSCULAR | Status: DC | PRN
  Administered 2014-05-12 – 2014-05-16 (×5): 1 mg via INTRAVENOUS
  Filled 2014-05-12 (×5): qty 1

## 2014-05-12 MED ORDER — GLYCOPYRROLATE 0.2 MG/ML IJ SOLN
INTRAMUSCULAR | Status: DC | PRN
  Administered 2014-05-12: 0.4 mg via INTRAVENOUS

## 2014-05-12 MED ORDER — PHENYLEPHRINE 40 MCG/ML (10ML) SYRINGE FOR IV PUSH (FOR BLOOD PRESSURE SUPPORT)
PREFILLED_SYRINGE | INTRAVENOUS | Status: AC
  Filled 2014-05-12: qty 10

## 2014-05-12 MED ORDER — ACETAMINOPHEN 325 MG PO TABS: 650.0000 mg | ORAL_TABLET | ORAL | Status: DC | PRN

## 2014-05-12 MED ORDER — MIDAZOLAM HCL 2 MG/2ML IJ SOLN
0.2500 mg | Freq: Once | INTRAMUSCULAR | Status: AC
  Administered 2014-05-12: 0.25 mg via INTRAVENOUS

## 2014-05-12 SURGICAL SUPPLY — 57 items
APL SKNCLS STERI-STRIP NONHPOA (GAUZE/BANDAGES/DRESSINGS) ×1
ATTRACTOMAT 16X20 MAGNETIC DRP (DRAPES) ×3 IMPLANT
BENZOIN TINCTURE PRP APPL 2/3 (GAUZE/BANDAGES/DRESSINGS) ×3 IMPLANT
BLADE SURG 10 STRL SS (BLADE) ×3 IMPLANT
BLADE SURG 15 STRL LF DISP TIS (BLADE) ×1 IMPLANT
BLADE SURG 15 STRL SS (BLADE) ×3
CANISTER SUCTION 2500CC (MISCELLANEOUS) ×3 IMPLANT
CHLORAPREP W/TINT 10.5 ML (MISCELLANEOUS) ×3 IMPLANT
CLIP TI MEDIUM 6 (CLIP) ×6 IMPLANT
CLIP TI WIDE RED SMALL 6 (CLIP) ×5 IMPLANT
CLOSURE WOUND 1/2 X4 (GAUZE/BANDAGES/DRESSINGS) ×1
CONT SPEC 4OZ CLIKSEAL STRL BL (MISCELLANEOUS) ×3 IMPLANT
COVER SURGICAL LIGHT HANDLE (MISCELLANEOUS) ×3 IMPLANT
DRAPE PED LAPAROTOMY (DRAPES) ×3 IMPLANT
DRAPE UTILITY XL STRL (DRAPES) ×6 IMPLANT
ELECT CAUTERY BLADE 6.4 (BLADE) ×3 IMPLANT
ELECT REM PT RETURN 9FT ADLT (ELECTROSURGICAL) ×3
ELECTRODE REM PT RTRN 9FT ADLT (ELECTROSURGICAL) ×1 IMPLANT
GAUZE SPONGE 2X2 8PLY STRL LF (GAUZE/BANDAGES/DRESSINGS) ×1 IMPLANT
GAUZE SPONGE 4X4 16PLY XRAY LF (GAUZE/BANDAGES/DRESSINGS) ×3 IMPLANT
GLOVE BIO SURGEON STRL SZ 6.5 (GLOVE) ×1 IMPLANT
GLOVE BIO SURGEON STRL SZ7.5 (GLOVE) ×6 IMPLANT
GLOVE BIO SURGEONS STRL SZ 6.5 (GLOVE) ×1
GLOVE BIOGEL PI IND STRL 7.0 (GLOVE) ×1 IMPLANT
GLOVE BIOGEL PI IND STRL 7.5 (GLOVE) IMPLANT
GLOVE BIOGEL PI INDICATOR 7.0 (GLOVE) ×2
GLOVE BIOGEL PI INDICATOR 7.5 (GLOVE) ×2
GLOVE SURG ORTHO 8.0 STRL STRW (GLOVE) ×3 IMPLANT
GOWN STRL REUS W/ TWL LRG LVL3 (GOWN DISPOSABLE) ×2 IMPLANT
GOWN STRL REUS W/ TWL XL LVL3 (GOWN DISPOSABLE) ×1 IMPLANT
GOWN STRL REUS W/TWL LRG LVL3 (GOWN DISPOSABLE) ×6
GOWN STRL REUS W/TWL XL LVL3 (GOWN DISPOSABLE) ×3
HEMOSTAT SURGICEL 2X4 FIBR (HEMOSTASIS) ×3 IMPLANT
KIT BASIN OR (CUSTOM PROCEDURE TRAY) ×3 IMPLANT
KIT ROOM TURNOVER OR (KITS) ×3 IMPLANT
NEEDLE HYPO 25GX1X1/2 BEV (NEEDLE) ×3 IMPLANT
NS IRRIG 1000ML POUR BTL (IV SOLUTION) ×3 IMPLANT
PACK SURGICAL SETUP 50X90 (CUSTOM PROCEDURE TRAY) ×3 IMPLANT
PAD ARMBOARD 7.5X6 YLW CONV (MISCELLANEOUS) ×6 IMPLANT
PENCIL BUTTON HOLSTER BLD 10FT (ELECTRODE) ×3 IMPLANT
SPONGE GAUZE 2X2 STER 10/PKG (GAUZE/BANDAGES/DRESSINGS) ×2
SPONGE GAUZE 4X4 12PLY STER LF (GAUZE/BANDAGES/DRESSINGS) ×2 IMPLANT
SPONGE INTESTINAL PEANUT (DISPOSABLE) IMPLANT
STRIP CLOSURE SKIN 1/2X4 (GAUZE/BANDAGES/DRESSINGS) ×2 IMPLANT
SUT MNCRL AB 4-0 PS2 18 (SUTURE) ×3 IMPLANT
SUT SILK 2 0 (SUTURE) ×3
SUT SILK 2-0 18XBRD TIE 12 (SUTURE) IMPLANT
SUT SILK 3 0 (SUTURE) ×3
SUT SILK 3-0 18XBRD TIE 12 (SUTURE) ×1 IMPLANT
SUT VIC AB 3-0 SH 18 (SUTURE) ×3 IMPLANT
SUT VIC AB 3-0 SH 8-18 (SUTURE) ×2 IMPLANT
SYR CONTROL 10ML LL (SYRINGE) ×3 IMPLANT
TAPE CLOTH SURG 4X10 WHT LF (GAUZE/BANDAGES/DRESSINGS) ×2 IMPLANT
TOWEL OR 17X24 6PK STRL BLUE (TOWEL DISPOSABLE) ×3 IMPLANT
TOWEL OR 17X26 10 PK STRL BLUE (TOWEL DISPOSABLE) ×3 IMPLANT
TUBE CONNECTING 12'X1/4 (SUCTIONS) ×1
TUBE CONNECTING 12X1/4 (SUCTIONS) ×2 IMPLANT

## 2014-05-12 NOTE — Anesthesia Procedure Notes (Signed)
Procedure Name: Intubation Date/Time: 05/12/2014 7:57 AM Performed by: Scheryl Darter Pre-anesthesia Checklist: Patient identified, Emergency Drugs available, Suction available, Patient being monitored and Timeout performed Patient Re-evaluated:Patient Re-evaluated prior to inductionOxygen Delivery Method: Circle system utilized Preoxygenation: Pre-oxygenation with 100% oxygen Intubation Type: IV induction Ventilation: Mask ventilation without difficulty Laryngoscope Size: Miller and 2 Grade View: Grade I Tube type: Oral Tube size: 7.0 mm Number of attempts: 1 Airway Equipment and Method: Stylet Placement Confirmation: ETT inserted through vocal cords under direct vision,  positive ETCO2 and breath sounds checked- equal and bilateral Secured at: 22 cm Tube secured with: Tape Dental Injury: Teeth and Oropharynx as per pre-operative assessment

## 2014-05-12 NOTE — Anesthesia Preprocedure Evaluation (Addendum)
Anesthesia Evaluation  Patient identified by MRN, date of birth, ID band Patient awake    Airway Mallampati: II  TM Distance: >3 FB Neck ROM: Full    Dental   Pulmonary Current Smoker,  breath sounds clear to auscultation        Cardiovascular hypertension, Rhythm:Regular Rate:Normal     Neuro/Psych Anxiety    GI/Hepatic negative GI ROS, Neg liver ROS,   Endo/Other    Renal/GU Renal disease     Musculoskeletal   Abdominal   Peds  Hematology   Anesthesia Other Findings   Reproductive/Obstetrics                            Anesthesia Physical Anesthesia Plan  ASA: III  Anesthesia Plan: General   Post-op Pain Management:    Induction: Intravenous  Airway Management Planned: Oral ETT  Additional Equipment:   Intra-op Plan:   Post-operative Plan: Extubation in OR  Informed Consent: I have reviewed the patients History and Physical, chart, labs and discussed the procedure including the risks, benefits and alternatives for the proposed anesthesia with the patient or authorized representative who has indicated his/her understanding and acceptance.   Dental advisory given  Plan Discussed with: CRNA and Anesthesiologist  Anesthesia Plan Comments:         Anesthesia Quick Evaluation

## 2014-05-12 NOTE — Consult Note (Signed)
[facility] Renal Consultation Note  Indication for Consultation:  Management of ESRD/hemodialysis; anemia, hypertension/volume and secondary hyperparathyroidism  HPI: [name] is a 54 y.o. female admitted sp Right thyroid lobectomy with ho recurrent secondary hyperparathyroidism with  Dr. Kathi Simpers  15 - 20 years ago  previous neck exploration and total parathyroidectomy with autotransplantation to the left forearm but had  Recent elavation  On PTH in the thousands not improved with med therapy/ Dr. Harlow Asa  eval  with Nuc. med parathyroid scan shows increased activity in the right thyroid bed, no increased activity in the forearm. 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   Ca pre op =9.5 and post op 8.1 corrected . Her HD schedule is [facility]) and stable as op using her [place]. 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        Past Medical History  Diagnosis Date  . HTN (hypertension)   . Pancreatitis   . ESRD (end stage renal disease)   . Glomerulonephritis   . Dialysis patient   . Anxiety   . Anemia     Past Surgical History  Procedure Laterality Date  . Kidney transplant    . Dialysis fistula creation Right   . Dilatation & currettage/hysteroscopy with resectocope N/A 04/17/2013    Procedure: DILATATION & CURETTAGE/HYSTEROSCOPY WITH RESECTOCOPE;  Surgeon: Marvene Staff, MD;  Location: [facility];  Service: Gynecology;  Laterality: N/A;  . Colonoscopy    . Stomach surgery        Family History  Problem Relation Age of Onset  . Adopted: Yes  . Diabetes Maternal Aunt     x2  . Diabetes Maternal Uncle       reports that she has been smoking Cigarettes.  She has a 5 pack-year smoking history. She has never used smokeless tobacco. She reports that she does not drink alcohol or use illicit drugs.   Allergies  Allergen Reactions  . Ace Inhibitors  Hives and Nausea And Vomiting  . Codeine Hives  . Penicillins Hives    Prior to Admission medications   Medication Sig Start Date End Date Taking? Authorizing Provider  ALPRAZolam Duanne Moron) 0.25 MG tablet Take 0.25 mg by mouth at bedtime as needed for anxiety.   Yes Historical Provider, MD  metoprolol tartrate (LOPRESSOR) 25 MG tablet Take 25 mg by mouth daily.    Yes Historical Provider, MD  sevelamer (RENAGEL) 800 MG tablet Take 1,600 mg by mouth 3 (three) times daily with meals.   Yes Historical Provider, MD    JKK:[id number], ALPRAZolam, HYDROcodone-acetaminophen, HYDROmorphone (DILAUDID) injection, ondansetron **OR** ondansetron (ZOFRAN) IV, promethazine  Results for orders placed or performed during the hospital encounter of 05/12/14 (from the past 48 hour(s))  Basic metabolic panel     Status: Abnormal   Collection Time: 05/12/14  6:44 AM  Result Value Ref Range   Sodium 132 (L) 135 - 145 mmol/L   Potassium 3.6 3.5 - 5.1 mmol/L   Chloride 92 (L) 96 - 112 mmol/L   CO2 24 19 - 32 mmol/L   Glucose, Bld 82 70 - 99 mg/dL   BUN 33 (H) 6 - 23 mg/dL   Creatinine, Ser 9.05 (H) 0.50 - 1.10 mg/dL   Calcium 9.5 8.4 - 10.5 mg/dL   GFR calc non Af Amer 4 (L) >90 mL/min   GFR calc Af Amer 5 (L) >  90 mL/min    Comment: (NOTE) The eGFR has been calculated using the CKD EPI equation. This calculation has not been validated in all clinical situations. eGFR's persistently <90 mL/min signify possible Chronic Kidney Disease.    Anion gap 16 (H) 5 - 15  CBC     Status: Abnormal   Collection Time: 05/12/14  6:44 AM  Result Value Ref Range   WBC 5.5 4.0 - 10.5 K/uL   RBC 3.54 (L) 3.87 - 5.11 MIL/uL   Hemoglobin 10.6 (L) 12.0 - 15.0 g/dL   HCT 31.8 (L) 36.0 - 46.0 %   MCV 89.8 78.0 - 100.0 fL   MCH 29.9 26.0 - 34.0 pg   MCHC 33.3 30.0 - 36.0 g/dL   RDW 14.0 11.5 - 15.5 %   Platelets 219 150 - 400 K/uL     ROS: only positives are acing in joints interment ly  , no fevers, chills, cp,  sob, or gi complainants.  Physical Exam: Filed Vitals:   05/12/14 1046  BP: 137/72  Pulse: 65  Temp: 98.2 F (36.8 C)  Resp: 16     General: alert BFx4, nad ,appropiate HEENT: [place] MMM, Nonicteric Neck: no jvd, midline surgical dressing intact dry/ clean  Heart: RRR Lungs: CTA bilat, non labored breathing  Abdomen: bs pos. Soft, NT, ND Extremities: no pedal edema  Skin: no overt rash, warm ,dry Neuro: alert Moves all extremities , no focal deficits  Dialysis Access: pos bruit R FA AVGG with aneurysm stable   Dialysis Orders: Center: [facility]   on TTS . EDW 63.5 kg HD Bath 2.0 k, 2.0 ca  Time 3.5 hrs Heparin 5,200. Access  RUA AVG BFR 450  DFR AF1.5    Hectorol 7 mcg IV/HD Epogen 0   Units IV/HD  Venofer  20m wkly  Other  11.2 hgb    Assessment/Plan 1. Secondary hyperparathyroidism- sp R Thyroidectomy with Parathyroid gland present in R thy. Lobe = Path pend. - fu ca in hosp / needs thyroid labs checked  As op as dw Dr. GHarlow Asa/ stop renavela use Oscal 2 ac 2 at bedtime  High ca bath  Fu outpt ca levels when dc till stable   2. ESRD -  HD TTS  3. Hypertension/volume  - BP  Stable  Hd to edw/ on lopressor 4. Anemia  - hgb stable no esa / fe wkly 5. Metabolic bone disease -  As above / fu ca phos in am labs/ On hectorol 743m/ change renvela to ca based 6. Anxiety - on Xanax prn   DaErnest HaberPA-C CaHidden [phone]/25/2016, 5:17 PM

## 2014-05-12 NOTE — Progress Notes (Addendum)
Patient stated she did not take metoprolol because it would make her sick without eating.  Attempted to call Dr. Oletta Lamas x 2, no answer. [phone] Spoke with Dr. Oletta Lamas re; patient not able to take metoprolol on empty stomach.

## 2014-05-12 NOTE — Transfer of Care (Signed)
Immediate Anesthesia Transfer of Care Note  Patient: [name]  Procedure(s) Performed: Procedure(s): PARATHYROIDECTOMY AND NECK EXPLORATION (N/A)  Patient Location: PACU  Anesthesia Type:General  Level of Consciousness: awake, alert , oriented and sedated  Airway & Oxygen Therapy: Patient Spontanous Breathing and Patient connected to nasal cannula oxygen  Post-op Assessment: Report given to RN, Post -op Vital signs reviewed and stable and Patient moving all extremities  Post vital signs: Reviewed and stable  Last Vitals:  Filed Vitals:   05/12/14 0622  BP: 125/71  Pulse: 74  Temp: 36.6 C  Resp: 20    Complications: No apparent anesthesia complications

## 2014-05-12 NOTE — Anesthesia Postprocedure Evaluation (Signed)
  Anesthesia Post-op Note  Patient: [name]  Procedure(s) Performed: Procedure(s): PARATHYROIDECTOMY AND NECK EXPLORATION (N/A)  Patient Location: PACU  Anesthesia Type:General  Level of Consciousness: awake  Airway and Oxygen Therapy: Patient Spontanous Breathing  Post-op Pain: mild  Post-op Assessment: Post-op Vital signs reviewed  Post-op Vital Signs: Reviewed  Last Vitals:  Filed Vitals:   05/12/14 1046  BP: 137/72  Pulse: 65  Temp: 36.8 C  Resp: 16    Complications: No apparent anesthesia complications

## 2014-05-12 NOTE — Op Note (Signed)
Procedure Note  Pre-operative Diagnosis:  Recurrent secondary hyperparathyroidism  Post-operative Diagnosis:  same  Surgeon:  Earnstine Regal, MD, FACS  Assistant:  Sharyn Dross, RNFA   Procedure:  Right thyroid lobectomy  Anesthesia:  General  Estimated Blood Loss:  minimal  Drains: none         Specimen: thyroid lobe to pathology  Indications:  The patient is a 54 year old female who presents with secondary hyperparathyroidism. Patient is referred by Dr. Vanetta Mulders for evaluation of recurrent secondary hyperparathyroidism. Patient has been on hemodialysis for approximately 30 years. 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 Patient has had a previous neck exploration and total parathyroidectomy with autotransplantation to the left forearm. This was performed by my partner, Dr. Benard Rink, approximately 15-20 years ago. Patient now has recurrent secondary hyperparathyroidism. Calcium level is 10.1 with a calcium times phosphorous product of 69. Intact PTH level is markedly elevated at greater than 3200. Nuclear med parathyroid scan shows increased activity in the right thyroid bed, no increased activity in the forearm.  USN shows a 2.8 cm nodule in the posterior right thyroid lobe consistent with parathyroid adenoma.  Procedure Details: Procedure was done in OR #9 at the [facility]  The patient was brought to the operating room and placed in a supine position on the operating room table.  Following administration of general anesthesia, the patient was positioned and then prepped and draped in the usual aseptic fashion.  After ascertaining that an adequate level of anesthesia had been achieved, a Kocher incision was made with #15 blade.  Dissection was carried through subcutaneous tissues and platysma. Hemostasis was achieved with the electrocautery.  Skin flaps were elevated cephalad and caudad  from the thyroid notch to the sternal notch.  A self-retaining retractor was placed for exposure.  Strap muscles were incised in the midline and dissection was begun on the right side.  Strap muscles were reflected laterally.  The right thyroid lobe was enlarged.  The lobe was gently mobilized with blunt dissection.  Superior pole vessels were dissected out and divided individually between small and medium Ligaclips.  The thyroid lobe was rolled anteriorly.  Branches of the inferior thyroid artery were divided between small Ligaclips.  Inferior venous tributaries were divided between Ligaclips. The recurrent laryngeal nerve was identified and preserved along its course.  The ligament of Gwenlyn Found was released with the electrocautery and the gland was mobilized onto the anterior trachea. Isthmus was mobilized across the midline.  There was no pyramidal lobe present.  The thyroid parenchyma was transected at the junction of the isthmus and contralateral thyroid lobe.  The lobe was sectioned on the table and demonstrated an enlarged parathyoid gland in the posterior portion of the lobe with compressed thyroid tissue anteriorly.  The thyroid lobe and isthmus were submitted to pathology for review.  The neck was irrigated with warm saline.  Fibular was placed throughout the operative field.  Strap muscles were reapproximated in the midline with interrupted 3-0 Vicryl sutures.  Platysma was closed with interrupted 3-0 Vicryl sutures.  Skin was closed with a running 4-0 Monocryl subcuticular suture.  Wound was washed and dried and benzoin and steri-strips were applied.  Dry gauze dressing was placed.  The patient was awakened from anesthesia and brought to the recovery room.  The patient tolerated the procedure well.   Earnstine Regal, MD, [facility] Office: [phone]

## 2014-05-12 NOTE — Interval H&P Note (Signed)
History and Physical Interval Note:  05/12/2014 7:03 AM  [name]  has presented today for surgery, with the diagnosis of SECONDARY HYPERPARATHYROIDISM,RENAL.  The various methods of treatment have been discussed with the patient and family. After consideration of risks, benefits and other options for treatment, the patient has consented to    Procedure(s): PARATHYROIDECTOMY AND NECK EXPLORATION (N/A) as a surgical intervention .    The patient's history has been reviewed, patient examined, no change in status, stable for surgery.  I have reviewed the patient's chart and labs.  Questions were answered to the patient's satisfaction.    Earnstine Regal, MD, [facility] Office: [place]

## 2014-05-13 ENCOUNTER — Encounter (HOSPITAL_COMMUNITY): Payer: Self-pay | Admitting: Surgery

## 2014-05-13 DIAGNOSIS — D631 Anemia in chronic kidney disease: Secondary | ICD-10-CM | POA: Diagnosis not present

## 2014-05-13 DIAGNOSIS — I120 Hypertensive chronic kidney disease with stage 5 chronic kidney disease or end stage renal disease: Secondary | ICD-10-CM | POA: Diagnosis not present

## 2014-05-13 DIAGNOSIS — N186 End stage renal disease: Secondary | ICD-10-CM | POA: Diagnosis not present

## 2014-05-13 DIAGNOSIS — F17200 Nicotine dependence, unspecified, uncomplicated: Secondary | ICD-10-CM | POA: Diagnosis not present

## 2014-05-13 DIAGNOSIS — F419 Anxiety disorder, unspecified: Secondary | ICD-10-CM | POA: Diagnosis not present

## 2014-05-13 DIAGNOSIS — N2581 Secondary hyperparathyroidism of renal origin: Secondary | ICD-10-CM | POA: Diagnosis not present

## 2014-05-13 DIAGNOSIS — Z992 Dependence on renal dialysis: Secondary | ICD-10-CM | POA: Diagnosis not present

## 2014-05-13 LAB — CBC
HCT: 28.9 % — ABNORMAL LOW (ref 36.0–46.0)
Hemoglobin: 9.7 g/dL — ABNORMAL LOW (ref 12.0–15.0)
MCH: 30.2 pg (ref 26.0–34.0)
MCHC: 33.6 g/dL (ref 30.0–36.0)
MCV: 90 fL (ref 78.0–100.0)
Platelets: 222 10*3/uL (ref 150–400)
RBC: 3.21 MIL/uL — ABNORMAL LOW (ref 3.87–5.11)
RDW: 14 % (ref 11.5–15.5)
WBC: 5.9 10*3/uL (ref 4.0–10.5)

## 2014-05-13 LAB — RENAL FUNCTION PANEL
ALBUMIN: 2.9 g/dL — AB (ref 3.5–5.2)
ANION GAP: 15 (ref 5–15)
BUN: 44 mg/dL — ABNORMAL HIGH (ref 6–23)
CO2: 22 mmol/L (ref 19–32)
Calcium: 6 mg/dL — CL (ref 8.4–10.5)
Chloride: 94 mmol/L — ABNORMAL LOW (ref 96–112)
Creatinine, Ser: 11.88 mg/dL — ABNORMAL HIGH (ref 0.50–1.10)
GFR calc Af Amer: 4 mL/min — ABNORMAL LOW (ref >90)
GFR calc non Af Amer: 3 mL/min — ABNORMAL LOW (ref >90)
GLUCOSE: 95 mg/dL (ref 70–99)
Phosphorus: 5.1 mg/dL — ABNORMAL HIGH (ref 2.3–4.6)
Potassium: 4 mmol/L (ref 3.5–5.1)
Sodium: 131 mmol/L — ABNORMAL LOW (ref 135–145)

## 2014-05-13 MED ORDER — CALCITRIOL 0.5 MCG PO CAPS
1.0000 ug | ORAL_CAPSULE | Freq: Two times a day (BID) | ORAL | Status: DC
  Administered 2014-05-14: 1 ug via ORAL
  Filled 2014-05-13 (×5): qty 2

## 2014-05-13 MED ORDER — HYDROCODONE-ACETAMINOPHEN 5-325 MG PO TABS: 1.0000 | ORAL_TABLET | ORAL | Status: DC | PRN

## 2014-05-13 MED ORDER — CALCITRIOL 0.5 MCG PO CAPS
1.0000 ug | ORAL_CAPSULE | Freq: Every day | ORAL | Status: DC
  Administered 2014-05-13: 1 ug via ORAL
  Filled 2014-05-13 (×2): qty 2

## 2014-05-13 MED ORDER — CALCIUM CARBONATE 1250 (500 CA) MG PO TABS
3.0000 | ORAL_TABLET | Freq: Every day | ORAL | Status: DC
  Administered 2014-05-13 – 2014-05-16 (×4): 1500 mg via ORAL
  Filled 2014-05-13 (×5): qty 3

## 2014-05-13 MED ORDER — CALCIUM CARBONATE 1250 (500 CA) MG PO TABS: 2.0000 | ORAL_TABLET | Freq: Every day | ORAL | Status: DC

## 2014-05-13 MED ORDER — CALCIUM CARBONATE 1250 (500 CA) MG PO TABS
3.0000 | ORAL_TABLET | Freq: Three times a day (TID) | ORAL | Status: DC
  Administered 2014-05-13 – 2014-05-15 (×6): 1500 mg via ORAL
  Filled 2014-05-13 (×6): qty 3

## 2014-05-13 MED ORDER — CALCIUM CARBONATE 1250 (500 CA) MG PO TABS
2.0000 | ORAL_TABLET | Freq: Three times a day (TID) | ORAL | Status: DC
  Administered 2014-05-13: 1000 mg via ORAL
  Filled 2014-05-13: qty 2

## 2014-05-13 NOTE — Discharge Instructions (Signed)
[facility] ° °THYROID & PARATHYROID SURGERY:  POST-OP INSTRUCTIONS ° °Always review your discharge instruction sheet from the facility where your surgery was performed. ° °A prescription for pain medication may be given to you upon discharge.  Take your pain medication as prescribed.  If narcotic pain medicine is not needed, then you may take acetaminophen (Tylenol) or ibuprofen (Advil) as needed. ° °Take your usually prescribed medications unless otherwise directed. ° °If you need a refill on your pain medication, please contact your pharmacy. They will contact our office to request authorization.  Prescriptions will not be processed by our office after 5 pm or on weekends. ° °Start with a light diet upon arrival home, such as soup and crackers or toast.  Be sure to drink plenty of fluids daily.  Resume your normal diet the day after surgery. ° °Most patients will experience some swelling and bruising on the chest and neck area.  Ice packs will help.  Swelling and bruising can take several days to resolve.  ° °It is common to experience some constipation after surgery.  Increasing fluid intake and taking a stool softener will usually help or prevent this problem.  A mild laxative (Milk of Magnesia or Miralax) should be taken according to package directions if there has been no bowel movement after 48 hours. ° °You have steri-strips and a gauze dressing over your incision.  You may remove the gauze bandage on the second day after surgery, and you may shower at that time.  Leave your steri-strips (small skin tapes) in place directly over the incision.  These strips should remain on the skin for 5-7 days and then be removed.  You may get them wet in the shower and pat them dry. ° °You may resume regular (light) daily activities beginning the next day - such as daily self-care, walking, climbing stairs - gradually increasing activities as tolerated.  You may have sexual intercourse when it is  comfortable.  Refrain from any heavy lifting or straining until approved by your doctor.  You may drive when you no longer are taking prescription pain medication, you can comfortably wear a seatbelt, and you can safely maneuver your car and apply brakes. ° °You should see your doctor in the office for a follow-up appointment approximately two to three weeks after your surgery.  Make sure that you call for this appointment within a day or two after you arrive home to insure a convenient appointment time. ° °WHEN TO CALL YOUR DOCTOR: °-- Fever greater than 101.5 °-- Inability to urinate °-- Nausea and/or vomiting - persistent °-- Extreme swelling or bruising °-- Continued bleeding from incision °-- Increased pain, redness, or drainage from the incision °-- Difficulty swallowing or breathing °-- Muscle cramping or spasms °-- Numbness or tingling in hands or around lips ° °The clinic staff is available to answer your questions during regular business hours.  Please don’t hesitate to call and ask to speak to one of the nurses if you have concerns. ° °Gershom Brobeck M. Alyla Pietila, MD, FACS °General & Endocrine Surgery °[facility] °Office: [phone] ° °Website: [email or web address] ° ° °

## 2014-05-13 NOTE — Progress Notes (Signed)
Patient ID: [name], female   DOB: [birth year], 54 y.o.   MRN: [id number]  [facility]  POD#: 1  Subjective: Patient doing well post op.  Mild pain.  Taking po intake.  For HD this AM.  Objective: Vital signs in last 24 hours: Temp:  [98 F (36.7 C)-99.3 F (37.4 C)] 99.3 F (37.4 C) (04/26 0650) Pulse Rate:  [63-91] 76 (04/26 0650) Resp:  [14-19] 18 (04/26 0650) BP: (89-148)/(52-82) 120/59 mmHg (04/26 0650) SpO2:  [98 %-100 %] 100 % (04/26 0650) Weight:  [65.3 kg (143 lb 15.4 oz)] 65.3 kg (143 lb 15.4 oz) (04/25 1205) Last BM Date: 05/12/14  Intake/Output from previous day: 04/25 0701 - 04/26 0700 In: 905 [P.O.:360; I.V.:545] Out: 10 [Blood:10] Intake/Output this shift:    Physical Exam: HEENT - sclerae clear, mucous membranes moist Neck - wound with small drainage, intact; voice normal Ext - no edema, non-tender Neuro - alert & oriented, no focal deficits  Lab Results:   Recent Labs  05/12/14 0644  WBC 5.5  HGB 10.6*  HCT 31.8*  PLT 219   BMET  Recent Labs  05/12/14 0644 05/12/14 1831  NA 132* 134*  K 3.6 4.2  CL 92* 95*  CO2 24 23  GLUCOSE 82 96  BUN 33* 37*  CREATININE 9.05* 9.90*  CALCIUM 9.5 7.6*   PT/INR No results for input(s): LABPROT, INR in the last 72 hours. Comprehensive Metabolic Panel:    Component Value Date/Time   NA 134* 05/12/2014 1831   NA 132* 05/12/2014 0644   K 4.2 05/12/2014 1831   K 3.6 05/12/2014 0644   CL 95* 05/12/2014 1831   CL 92* 05/12/2014 0644   CO2 23 05/12/2014 1831   CO2 24 05/12/2014 0644   BUN 37* 05/12/2014 1831   BUN 33* 05/12/2014 0644   CREATININE 9.90* 05/12/2014 1831   CREATININE 9.05* 05/12/2014 0644   GLUCOSE 96 05/12/2014 1831   GLUCOSE 82 05/12/2014 0644   CALCIUM 7.6* 05/12/2014 1831   CALCIUM 9.5 05/12/2014 0644   AST 15 01/06/2007 1215   AST 14 01/05/2007 0510   ALT <8 01/06/2007 1215   ALT <8 01/05/2007 0510   ALKPHOS 48 01/06/2007 1215   ALKPHOS 60 01/05/2007 0510   BILITOT 0.9 01/06/2007 1215   BILITOT 1.1 01/05/2007 0510   PROT 6.6 01/06/2007 1215   PROT 7.2 01/05/2007 0510   ALBUMIN 3.3* 05/12/2014 1831   ALBUMIN 3.1* 01/08/2007 0656    Studies/Results: No results found.  Anti-infectives: Anti-infectives    Start     Dose/Rate Route Frequency Ordered Stop   05/12/14 0600  ciprofloxacin (CIPRO) IVPB 400 mg     400 mg 200 mL/hr over 60 Minutes Intravenous On call to O.R. 05/11/14 1426 05/12/14 0800      Assessment & Plans: Status post right thyroid lobectomy / parathyroidectomy  For HD this AM  Anticipate discharge home this afternoon  Per renal service  Earnstine Regal, MD, [facility] Office: [place] 05/13/2014

## 2014-05-13 NOTE — Procedures (Signed)
Patient seen on Hemodialysis. QB 400, UF goal 0.5L Treatment adjusted as needed.  Elmarie Shiley MD [facility]. Office # [phone] Pager # [phone] 3:32 PM

## 2014-05-13 NOTE — Progress Notes (Addendum)
Nutrition Brief Note  Patient identified on the Malnutrition Screening Tool (MST) Report  Wt Readings from Last 15 Encounters:  05/12/14 143 lb 15.4 oz (65.3 kg)  06/12/13 145 lb 9.6 oz (66.044 kg)  04/22/13 144 lb (65.318 kg)  04/10/13 144 lb 2 oz (65.375 kg)  03/25/13 144 lb 6 oz (65.488 kg)   Pt s/p Procedure(s) on 05/12/14: PARATHYROIDECTOMY AND NECK EXPLORATION (N/A) as a surgical intervention .   Spoke with pt who reports great appetite presently. She consumed all of her breakfast, but reports mild pain swallowing due to surgery. She reveals decreased appetite for about one month PTA due to enlarged thyroid, but reveals her appetite has now returned. She estimated she has lost about "1-2 kilos" in the last month, as her dry weight for HD was just adjusted. Wt has remained stable over the past 2 months. She denies any further nutritional needs at this time, but expressed appreciation for visit.   Body mass index is 22.54 kg/(m^2). Patient meets criteria for normal weight range based on current BMI.   Current diet order is renal with 1200 ml fluid restriction, patient is consuming approximately 50-100% of meals at this time. Labs and medications reviewed.   No nutrition interventions warranted at this time. If nutrition issues arise, please consult RD.   Rashmi Tallent A. Jimmye Norman, RD, LDN, CDE Pager: [phone] After hours Pager: [phone]

## 2014-05-13 NOTE — Progress Notes (Signed)
UR completed 

## 2014-05-14 DIAGNOSIS — F17200 Nicotine dependence, unspecified, uncomplicated: Secondary | ICD-10-CM | POA: Diagnosis not present

## 2014-05-14 DIAGNOSIS — N186 End stage renal disease: Secondary | ICD-10-CM | POA: Diagnosis not present

## 2014-05-14 DIAGNOSIS — D631 Anemia in chronic kidney disease: Secondary | ICD-10-CM | POA: Diagnosis not present

## 2014-05-14 DIAGNOSIS — F419 Anxiety disorder, unspecified: Secondary | ICD-10-CM | POA: Diagnosis not present

## 2014-05-14 DIAGNOSIS — Z992 Dependence on renal dialysis: Secondary | ICD-10-CM | POA: Diagnosis not present

## 2014-05-14 DIAGNOSIS — N2581 Secondary hyperparathyroidism of renal origin: Secondary | ICD-10-CM | POA: Diagnosis not present

## 2014-05-14 DIAGNOSIS — I120 Hypertensive chronic kidney disease with stage 5 chronic kidney disease or end stage renal disease: Secondary | ICD-10-CM | POA: Diagnosis not present

## 2014-05-14 LAB — RENAL FUNCTION PANEL
ALBUMIN: 2.8 g/dL — AB (ref 3.5–5.2)
ANION GAP: 12 (ref 5–15)
BUN: 19 mg/dL (ref 6–23)
CO2: 26 mmol/L (ref 19–32)
Calcium: 6.9 mg/dL — ABNORMAL LOW (ref 8.4–10.5)
Chloride: 95 mmol/L — ABNORMAL LOW (ref 96–112)
Creatinine, Ser: 7.5 mg/dL — ABNORMAL HIGH (ref 0.50–1.10)
GFR calc Af Amer: 6 mL/min — ABNORMAL LOW (ref >90)
GFR calc non Af Amer: 6 mL/min — ABNORMAL LOW (ref >90)
Glucose, Bld: 78 mg/dL (ref 70–99)
PHOSPHORUS: 4.1 mg/dL (ref 2.3–4.6)
POTASSIUM: 3.6 mmol/L (ref 3.5–5.1)
Sodium: 133 mmol/L — ABNORMAL LOW (ref 135–145)

## 2014-05-14 LAB — POCT I-STAT 4, (NA,K, GLUC, HGB,HCT)
Glucose, Bld: 76 mg/dL (ref 70–99)
HEMATOCRIT: 33 % — AB (ref 36.0–46.0)
Hemoglobin: 11.2 g/dL — ABNORMAL LOW (ref 12.0–15.0)
Potassium: 4.1 mmol/L (ref 3.5–5.1)
SODIUM: 135 mmol/L (ref 135–145)

## 2014-05-14 LAB — MAGNESIUM: Magnesium: 1.9 mg/dL (ref 1.5–2.5)

## 2014-05-14 MED ORDER — CALCITRIOL 0.5 MCG PO CAPS
2.0000 ug | ORAL_CAPSULE | Freq: Two times a day (BID) | ORAL | Status: DC
  Administered 2014-05-14 – 2014-05-17 (×4): 2 ug via ORAL
  Filled 2014-05-14 (×9): qty 4

## 2014-05-14 MED ORDER — DOCUSATE SODIUM 100 MG PO CAPS
100.0000 mg | ORAL_CAPSULE | Freq: Two times a day (BID) | ORAL | Status: DC
  Administered 2014-05-14 – 2014-05-16 (×3): 100 mg via ORAL
  Filled 2014-05-14 (×4): qty 1

## 2014-05-14 NOTE — Progress Notes (Signed)
Patient ID: [name], female   DOB: [birth year], 54 y.o.   MRN: [id number]  [facility]  POD#: 2  Subjective: Patient up in room, no complaints.  Tolerating diet.  No nausea.  Mild pain.  Objective: Vital signs in last 24 hours: Temp:  [98.3 F (36.8 C)-99.1 F (37.3 C)] 98.7 F (37.1 C) (04/27 0522) Pulse Rate:  [62-84] 74 (04/27 0522) Resp:  [17-18] 17 (04/27 0522) BP: (93-142)/(47-76) 115/64 mmHg (04/27 0522) SpO2:  [98 %-100 %] 100 % (04/27 0522) Weight:  [66 kg (145 lb 8.1 oz)] 66 kg (145 lb 8.1 oz) (04/26 1345) Last BM Date: 05/13/14  Intake/Output from previous day: 04/26 0701 - 04/27 0700 In: 640 [P.O.:640] Out: 1400  Intake/Output this shift:    Physical Exam: HEENT - sclerae clear, mucous membranes moist Neck - dressing dry and intact; mild STS; voice normal Ext - no edema, non-tender Neuro - alert & oriented, no focal deficits  Lab Results:   Recent Labs  05/12/14 0644 05/12/14 0743 05/13/14 1400  WBC 5.5  --  5.9  HGB 10.6* 11.2* 9.7*  HCT 31.8* 33.0* 28.9*  PLT 219  --  222   BMET  Recent Labs  05/13/14 1400 05/14/14 0509  NA 131* 133*  K 4.0 3.6  CL 94* 95*  CO2 22 26  GLUCOSE 95 78  BUN 44* 19  CREATININE 11.88* 7.50*  CALCIUM 6.0* 6.9*   PT/INR No results for input(s): LABPROT, INR in the last 72 hours. Comprehensive Metabolic Panel:    Component Value Date/Time   NA 133* 05/14/2014 0509   NA 131* 05/13/2014 1400   K 3.6 05/14/2014 0509   K 4.0 05/13/2014 1400   CL 95* 05/14/2014 0509   CL 94* 05/13/2014 1400   CO2 26 05/14/2014 0509   CO2 22 05/13/2014 1400   BUN 19 05/14/2014 0509   BUN 44* 05/13/2014 1400   CREATININE 7.50* 05/14/2014 0509   CREATININE 11.88* 05/13/2014 1400   GLUCOSE 78 05/14/2014 0509   GLUCOSE 95 05/13/2014 1400   CALCIUM 6.9* 05/14/2014 0509   CALCIUM 6.0* 05/13/2014 1400   AST 15 01/06/2007 1215   AST 14 01/05/2007 0510   ALT <8 01/06/2007 1215   ALT <8  01/05/2007 0510   ALKPHOS 48 01/06/2007 1215   ALKPHOS 60 01/05/2007 0510   BILITOT 0.9 01/06/2007 1215   BILITOT 1.1 01/05/2007 0510   PROT 6.6 01/06/2007 1215   PROT 7.2 01/05/2007 0510   ALBUMIN 2.8* 05/14/2014 0509   ALBUMIN 2.9* 05/13/2014 1400    Studies/Results: No results found.  Anti-infectives: Anti-infectives    Start     Dose/Rate Route Frequency Ordered Stop   05/12/14 0600  ciprofloxacin (CIPRO) IVPB 400 mg     400 mg 200 mL/hr over 60 Minutes Intravenous On call to O.R. 05/11/14 1426 05/12/14 0800      Assessment & Plans: Status post thyroid lobectomy / parathyroidectomy  Calcium 6.0 yesterday, 6.9 this AM  HD yesterday  Home when stable from renal service standpoint  Earnstine Regal, MD, [facility] Office: [place] 05/14/2014

## 2014-05-14 NOTE — Progress Notes (Signed)
UR completed 

## 2014-05-14 NOTE — Progress Notes (Signed)
Subjective:  tolerated hd yest on schedule/ feeling better  Objective Vital signs in last 24 hours: Filed Vitals:   05/13/14 1700 05/13/14 1720 05/13/14 2209 05/14/14 0522  BP: 130/70 142/76 106/62 115/64  Pulse: 65 73 84 74  Temp:  98.3 F (36.8 C) 99.1 F (37.3 C) 98.7 F (37.1 C)  TempSrc:  Oral Oral   Resp:  18 18 17   Height:      Weight:      SpO2:  98% 99% 100%   Weight change: 0.7 kg (1 lb 8.7 oz)  Physical Exam: General: alert BFx4, nad ,appropiate Heart: RRR Lungs: CTA bilat, non labored breathing  Abdomen: bs pos. Soft, NT, ND Extremities: trace pedal edema  Dialysis Access: pos bruit R FA AVGG with aneurysm stable   Dialysis Orders: Center: [facility] on TTS . EDW 63.5 kg HD Bath 2.0 k, 2.0 ca Time 3.5 hrs Heparin 5,200. Access RUA AVG BFR 450 DFR AF1.5  Hectorol 7 mcg IV/HD Epogen 0 Units IV/HD Venofer 50mg  wkly  Other 11.2 hgb   Problem/Plan: 1. Secondary hyperparathyroidism- sp R Thyroidectomy with Parathyroid gland present in R thy. Lobe /  Corrected ca 7.8 phos 4.1  / needs thyroid labs checked As op as dw Dr. Harlow Asa / stop renavela use Oscal 2 ac 2 at bedtime High ca bath Fu outpt ca levels when dc till stable / today pt states she wants to use TUMS EX dw her 1 each meal and 2 between meal 2 at bedtime . 2. ESRD - HD TTS  3. Hypertension/volume - BP Stablenow 115/64/ low pre hd yest then stabilized /was given pain med pre hd / no change in edw/ on lopressor 4. Anemia - hgb =9.7 no esa  Restart  At dc / fe wkly 5. Metabolic bone disease - As above / fu ca phos in am labs/ On hectorol 47mcg/ change renvela to ca based 6. Anxiety - on Xanax prn   Ernest Haber, PA-C [facility] Beeper [phone] 05/14/2014,9:24 AM  LOS: 2 days   Labs: Basic Metabolic Panel:  Recent Labs Lab 05/12/14 1831 05/13/14 1400 05/14/14 0509  NA 134* 131* 133*  K 4.2 4.0 3.6  CL 95* 94* 95*  CO2 23 22 26   GLUCOSE 96 95 78  BUN 37* 44*  19  CREATININE 9.90* 11.88* 7.50*  CALCIUM 7.6* 6.0* 6.9*  PHOS 6.4* 5.1* 4.1   Liver Function Tests:  Recent Labs Lab 05/12/14 1831 05/13/14 1400 05/14/14 0509  ALBUMIN 3.3* 2.9* 2.8*   No results for input(s): LIPASE, AMYLASE in the last 168 hours. No results for input(s): AMMONIA in the last 168 hours. CBC:  Recent Labs Lab 05/12/14 0644 05/12/14 0743 05/13/14 1400  WBC 5.5  --  5.9  HGB 10.6* 11.2* 9.7*  HCT 31.8* 33.0* 28.9*  MCV 89.8  --  90.0  PLT 219  --  222   Cardiac Enzymes: No results for input(s): CKTOTAL, CKMB, CKMBINDEX, TROPONINI in the last 168 hours. CBG: No results for input(s): GLUCAP in the last 168 hours.  Studies/Results: No results found. Medications: . sodium chloride 10 mL/hr at 05/12/14 0930   . calcitRIOL  1 mcg Oral BID  . calcium carbonate  3 tablet Oral TID BM  . calcium carbonate  3 tablet Oral QHS  . feeding supplement (ENSURE ENLIVE)  237 mL Oral BID BM  . [START ON 05/15/2014] ferric gluconate (FERRLECIT/NULECIT) IV  62.5 mg Intravenous Q Thu-HD  . metoprolol tartrate  25  mg Oral Daily

## 2014-05-15 DIAGNOSIS — I120 Hypertensive chronic kidney disease with stage 5 chronic kidney disease or end stage renal disease: Secondary | ICD-10-CM | POA: Diagnosis not present

## 2014-05-15 DIAGNOSIS — N186 End stage renal disease: Secondary | ICD-10-CM | POA: Diagnosis not present

## 2014-05-15 DIAGNOSIS — T82868A Thrombosis of vascular prosthetic devices, implants and grafts, initial encounter: Secondary | ICD-10-CM | POA: Diagnosis not present

## 2014-05-15 DIAGNOSIS — F419 Anxiety disorder, unspecified: Secondary | ICD-10-CM | POA: Diagnosis not present

## 2014-05-15 DIAGNOSIS — N2581 Secondary hyperparathyroidism of renal origin: Secondary | ICD-10-CM | POA: Diagnosis not present

## 2014-05-15 DIAGNOSIS — Z992 Dependence on renal dialysis: Secondary | ICD-10-CM | POA: Diagnosis not present

## 2014-05-15 DIAGNOSIS — F17200 Nicotine dependence, unspecified, uncomplicated: Secondary | ICD-10-CM | POA: Diagnosis not present

## 2014-05-15 DIAGNOSIS — D631 Anemia in chronic kidney disease: Secondary | ICD-10-CM | POA: Diagnosis not present

## 2014-05-15 LAB — RENAL FUNCTION PANEL
Albumin: 2.9 g/dL — ABNORMAL LOW (ref 3.5–5.2)
Anion gap: 13 (ref 5–15)
BUN: 31 mg/dL — ABNORMAL HIGH (ref 6–23)
CO2: 26 mmol/L (ref 19–32)
Calcium: 6.4 mg/dL — CL (ref 8.4–10.5)
Chloride: 94 mmol/L — ABNORMAL LOW (ref 96–112)
Creatinine, Ser: 10.79 mg/dL — ABNORMAL HIGH (ref 0.50–1.10)
GFR calc Af Amer: 4 mL/min — ABNORMAL LOW (ref >90)
GFR calc non Af Amer: 4 mL/min — ABNORMAL LOW (ref >90)
GLUCOSE: 80 mg/dL (ref 70–99)
PHOSPHORUS: 4.3 mg/dL (ref 2.3–4.6)
POTASSIUM: 4.8 mmol/L (ref 3.5–5.1)
SODIUM: 133 mmol/L — AB (ref 135–145)

## 2014-05-15 MED ORDER — CALCIUM CARBONATE ANTACID 500 MG PO CHEW
3.0000 | CHEWABLE_TABLET | Freq: Once | ORAL | Status: AC
  Administered 2014-05-15: 600 mg via ORAL
  Filled 2014-05-15: qty 2

## 2014-05-15 MED ORDER — DEXTROSE 5 % IV SOLN
Freq: Once | INTRAVENOUS | Status: AC
  Administered 2014-05-15: 22:00:00 via INTRAVENOUS
  Filled 2014-05-15: qty 100

## 2014-05-15 NOTE — Progress Notes (Signed)
Report from HD nurse who stated that graft is clotted, unable to do HD and pt is returning to room.  She will need her lab drawn when she returns to room.

## 2014-05-15 NOTE — Progress Notes (Signed)
Subjective:   For hd today/  "can feel that Calcium pill( calcitriol ) in Stomach"  Objective Vital signs in last 24 hours: Filed Vitals:   05/14/14 0522 05/14/14 1259 05/14/14 2137 05/15/14 0552  BP: 115/64 114/70 100/57 126/78  Pulse: 74 66 69 68  Temp: 98.7 F (37.1 C) 98.6 F (37 C) 97.9 F (36.6 C) 98 F (36.7 C)  TempSrc:  Oral Oral   Resp: 17 18 18 18   Height:      Weight:      SpO2: 100% 100% 100% 100%   Weight change:  Physical Exam: General: alert BFx4, nad ,appropiate Heart: RRR no rub or gallop  Lungs: CTA bilat, non labored breathing  Abdomen: bs pos. Soft, NT, ND Extremities: trace pedal edema  Dialysis Access: pos bruit R FA AVGG with aneurysm stable   Dialysis Orders: Center: [facility] on TTS . EDW 63.5 kg HD Bath 2.0 k, 2.0 ca Time 3.5 hrs Heparin 5,200. Access RUA AVG BFR 450 DFR AF1.5  Hectorol 7 mcg IV/HD Epogen 0 Units IV/HD Venofer 50mg  wkly  Other 11.2 hgb   Problem/Plan: 1. Secondary hyperparathyroidism- sp R Thyroidectomy with Parathyroid gland present in R thy. Lobe / am labs pend HD this am  yest  Corrected ca 7.8 phos 4.1 / needs thyroid labs checked As op as dw Dr. Harlow Asa / stop renavela use Oscal 2 ac 2 at bedtime/Calcitriol 1.0 mcg bid po / High ca bath Fu outpt ca levels when dc till stable / today pt states she wants to use TUMS EX dw her 1 each meal and 2 between meal 2 at bedtime . 2. ESRD - HD TTS  3. Hypertension/volume - BP Stablenow / no change in edw/ on lopressor 4. Anemia - hgb =9.7 no esa Restart today  And  At dc / fe wkly 5. Metabolic bone disease - As above / fu ca phos in am labs/ On hectorol 7 mcg dc and Calcitriol  started/ change renvela to ca based 6. Anxiety - on Xanax prn 7. HO Pancreatitis- may need to dc po  Calcitriol if this started aggravating GI / use iv on hd fu ca level today  Ernest Haber, PA-C [facility] Beeper [phone] 05/15/2014,9:48 AM  LOS: 3 days    Labs: Basic Metabolic Panel:  Recent Labs Lab 05/12/14 1831 05/13/14 1400 05/14/14 0509  NA 134* 131* 133*  K 4.2 4.0 3.6  CL 95* 94* 95*  CO2 23 22 26   GLUCOSE 96 95 78  BUN 37* 44* 19  CREATININE 9.90* 11.88* 7.50*  CALCIUM 7.6* 6.0* 6.9*  PHOS 6.4* 5.1* 4.1   Liver Function Tests:  Recent Labs Lab 05/12/14 1831 05/13/14 1400 05/14/14 0509  ALBUMIN 3.3* 2.9* 2.8*   No results for input(s): LIPASE, AMYLASE in the last 168 hours. No results for input(s): AMMONIA in the last 168 hours. CBC:  Recent Labs Lab 05/12/14 0644 05/12/14 0743 05/13/14 1400  WBC 5.5  --  5.9  HGB 10.6* 11.2* 9.7*  HCT 31.8* 33.0* 28.9*  MCV 89.8  --  90.0  PLT 219  --  222   Cardiac Enzymes: No results for input(s): CKTOTAL, CKMB, CKMBINDEX, TROPONINI in the last 168 hours. CBG: No results for input(s): GLUCAP in the last 168 hours.  Studies/Results: No results found. Medications: . sodium chloride 10 mL/hr at 05/12/14 0930   . calcitRIOL  2 mcg Oral BID  . calcium carbonate  3 tablet Oral TID BM  . calcium  carbonate  3 tablet Oral QHS  . docusate sodium  100 mg Oral BID  . feeding supplement (ENSURE ENLIVE)  237 mL Oral BID BM  . ferric gluconate (FERRLECIT/NULECIT) IV  62.5 mg Intravenous Q Thu-HD  . metoprolol tartrate  25 mg Oral Daily

## 2014-05-15 NOTE — Progress Notes (Signed)
UR completed 

## 2014-05-15 NOTE — Progress Notes (Signed)
Calcium 6.4 called to DR. on call

## 2014-05-15 NOTE — Progress Notes (Signed)
Established Dialysis Access  History of Present Illness  [name] is a 54 y.o. ([birth year]) female who presents for RUA AVG thrombosis.  This patient has not been managed by [facility] since 2007.  Pt is unaware of the chronology of interval intervention of this RUA AVG except for recent percutaneous intervention by [facility] ~2 months ago.  The patient denies any steal sx.  She denies any bleeding complication from the RUA AVG.   Past Medical History  Diagnosis Date  . HTN (hypertension)   . Pancreatitis   . Anxiety   . Anemia   . Claustrophobia   . ESRD (end stage renal disease) on dialysis     "TTS; [facility]" (05/12/2014)  . Glomerulonephritis   . Secondary hyperparathyroidism     /notes 05/11/2014    Past Surgical History  Procedure Laterality Date  . Kidney transplant  1997  . Arteriovenous graft placement Right 1990's?  . Dilatation & currettage/hysteroscopy with resectocope N/A 04/17/2013    Procedure: DILATATION & CURETTAGE/HYSTEROSCOPY WITH RESECTOCOPE;  Surgeon: Marvene Staff, MD;  Location: [facility];  Service: Gynecology;  Laterality: N/A;  . Colonoscopy    . Peritoneal catheter insertion    . Parathyroidectomy  03/2000 05/12/2014    w/neck exploration & autotransplantation/notes 06/02/2010; w/neck exploration  . Dilation and curettage of uterus    . Peritoneal catheter removal  08/1999    [name] 06/02/2010  . Breast biopsy Left   . Thrombectomy and revision of arterioventous (av) goretex  graft Right 01/2002; 11/2003; 01/2004; 08/07/2004; 08/10/2004; 11/13/2005    [name] 5/1/2012Marland Kitchen [name] 5/16/2012Marland Kitchen [name] 5/16/2012Marland Kitchen [name] 5/16/2012Marland Kitchen [name] 06/02/2010; [name] 06/02/2010  . Av fistula placement Left 09/2000    upper arm/notes 06/02/2010  . Thrombectomy Left 02/2002    fistula/notes 06/02/2010  . Arteriovenous graft placement Left 07/2002    upper arm/notes 06/02/2010  . Thrombectomy and revision of arterioventous (av) goretex  graft Left 08/23/2002; 09/13/2002;  07/08/2003    [name] 5/16/2012Marland Kitchen [name] 5/16/2012Marland Kitchen [name] 06/02/2010  . Parathyroidectomy N/A 05/12/2014    Procedure: PARATHYROIDECTOMY AND NECK EXPLORATION;  Surgeon: Armandina Gemma, MD;  Location: [facility];  Service: General;  Laterality: N/A;    History   Social History  . Marital Status: Divorced    Spouse Name: N/A  . Number of Children: 1  . Years of Education: N/A   Occupational History  . Not on file.   Social History Main Topics  . Smoking status: Current Every Day Smoker -- 0.25 packs/day for 20 years    Types: Cigarettes  . Smokeless tobacco: Never Used  . Alcohol Use: No  . Drug Use: No  . Sexual Activity: Not on file   Other Topics Concern  . Not on file   Social History Narrative    Family History  Problem Relation Age of Onset  . Adopted: Yes  . Diabetes Maternal Aunt     x2  . Diabetes Maternal Uncle     No current facility-administered medications on file prior to encounter.   Current Outpatient Prescriptions on File Prior to Encounter  Medication Sig Dispense Refill  . ALPRAZolam (XANAX) 0.25 MG tablet Take 0.25 mg by mouth at bedtime as needed for anxiety.      Allergies  Allergen Reactions  . Ace Inhibitors Hives and Nausea And Vomiting  . Codeine Hives  . Penicillins Hives    REVIEW OF SYSTEMS:  (Positives checked otherwise negative)  CARDIOVASCULAR:  []  chest pain, []  chest pressure, []   palpitations, []  shortness of breath when laying flat, []  shortness of breath with exertion,  []  pain in feet when walking, []  pain in feet when laying flat, []  history of blood clot in veins (DVT), []  history of phlebitis, []  swelling in legs, []  varicose veins  PULMONARY:  []  productive cough, []  asthma, []  wheezing  NEUROLOGIC:  []  weakness in arms or legs, []  numbness in arms or legs, []  difficulty speaking or slurred speech, []  temporary loss of vision in one eye, []  dizziness  HEMATOLOGIC:  []  bleeding problems, []  problems with blood clotting too  easily  MUSCULOSKEL:  [x]  joint pain, []  joint swelling  GASTROINTEST:  []  vomiting blood, []  blood in stool     GENITOURINARY:  []  burning with urination, []  blood in urine, [x]  ESRD-HD: T-R-S  PSYCHIATRIC:  []  history of major depression  INTEGUMENTARY:  []  rashes, []  ulcers     Physical Examination  Filed Vitals:   05/14/14 1259 05/14/14 2137 05/15/14 0552 05/15/14 1400  BP: 114/70 100/57 126/78 134/82  Pulse: 66 69 68 74  Temp: 98.6 F (37 C) 97.9 F (36.6 C) 98 F (36.7 C) 98.2 F (36.8 C)  TempSrc: Oral Oral  Oral  Resp: 18 18 18 16   Height:      Weight:      SpO2: 100% 100% 100% 100%   Body mass index is 22.78 kg/(m^2).  General: A&O x 3, WD, WN  Pulmonary: Sym exp, good air movt, CTAB, no rales, rhonchi, & wheezing  Cardiac: RRR, Nl S1, S2, no Murmurs, rubs or gallops  Vascular: Vessel Right Left  Radial Faintly Palpable Faintly Palpable  Ulnar Not Palpable Not Palpable  Brachial Palpable Palpable   Gastrointestinal: soft, NTND, -G/R, - HSM, - masses, - CVAT B  Musculoskeletal: M/S 5/5 throughout . Extremities without  ischemic changes , no palpable thrill in access, no bruit in access, pseudoaneurysms evident in graft, stiff segment palpable proximally  Neurologic: Pain and light touch intact in extremities including right hand, hand grip 5/5, Motor exam as listed above  Laboratory: CBC:    Component Value Date/Time   WBC 5.9 05/13/2014 1400   RBC 3.21* 05/13/2014 1400   HGB 9.7* 05/13/2014 1400   HCT 28.9* 05/13/2014 1400   PLT 222 05/13/2014 1400   MCV 90.0 05/13/2014 1400   MCH 30.2 05/13/2014 1400   MCHC 33.6 05/13/2014 1400   RDW 14.0 05/13/2014 1400   LYMPHSABS 1.5 01/04/2007 1830   MONOABS 0.4 01/04/2007 1830   EOSABS 0.1* 01/04/2007 1830   BASOSABS 0.0 01/04/2007 1830    BMP:    Component Value Date/Time   NA 133* 05/14/2014 0509   K 3.6 05/14/2014 0509   CL 95* 05/14/2014 0509   CO2 26 05/14/2014 0509   GLUCOSE 78  05/14/2014 0509   BUN 19 05/14/2014 0509   CREATININE 7.50* 05/14/2014 0509   CALCIUM 6.9* 05/14/2014 0509   GFRNONAA 6* 05/14/2014 0509   GFRAA 6* 05/14/2014 0509    Coagulation: No results found for: INR, PROTIME No results found for: PTT  Radiology: 04/22/14 CXR (2-v): No active cardiopulmonary disease. Bony changes of renal osteodystrophy.   Medical Decision Making  [name] is a 54 y.o. female who presents with thrombosed RUA AVG, ESRD requiring hemodialysis   None of recent outpatient access records are available.  RUA AVG feels like it has been stented, which is consistent with the practice pattern at the outpatient access center.  I would recommended referring  patient to IR for percutaneous thrombectomy, as this is likely the only salvage option if the high brachial/basilic vein system has already been stented.  If unable to salvage the RUA AVG, would place [facility] and start the process for evaluation for a thigh AVG.  Reconsult [facility] as needed.  Adele Barthel, MD [facility] Office: [phone] Pager: [phone]  05/15/2014, 4:32 PM

## 2014-05-16 ENCOUNTER — Observation Stay (HOSPITAL_COMMUNITY): Payer: Medicare Other

## 2014-05-16 DIAGNOSIS — N2581 Secondary hyperparathyroidism of renal origin: Secondary | ICD-10-CM | POA: Diagnosis not present

## 2014-05-16 DIAGNOSIS — F419 Anxiety disorder, unspecified: Secondary | ICD-10-CM | POA: Diagnosis not present

## 2014-05-16 DIAGNOSIS — T82898A Other specified complication of vascular prosthetic devices, implants and grafts, initial encounter: Secondary | ICD-10-CM | POA: Diagnosis present

## 2014-05-16 DIAGNOSIS — Z992 Dependence on renal dialysis: Secondary | ICD-10-CM | POA: Diagnosis not present

## 2014-05-16 DIAGNOSIS — N186 End stage renal disease: Secondary | ICD-10-CM | POA: Diagnosis not present

## 2014-05-16 DIAGNOSIS — F17200 Nicotine dependence, unspecified, uncomplicated: Secondary | ICD-10-CM | POA: Diagnosis not present

## 2014-05-16 DIAGNOSIS — Z4901 Encounter for fitting and adjustment of extracorporeal dialysis catheter: Secondary | ICD-10-CM | POA: Diagnosis not present

## 2014-05-16 DIAGNOSIS — I120 Hypertensive chronic kidney disease with stage 5 chronic kidney disease or end stage renal disease: Secondary | ICD-10-CM | POA: Diagnosis not present

## 2014-05-16 DIAGNOSIS — D631 Anemia in chronic kidney disease: Secondary | ICD-10-CM | POA: Diagnosis not present

## 2014-05-16 LAB — CBC
HCT: 29.1 % — ABNORMAL LOW (ref 36.0–46.0)
Hemoglobin: 10 g/dL — ABNORMAL LOW (ref 12.0–15.0)
MCH: 30.7 pg (ref 26.0–34.0)
MCHC: 34.4 g/dL (ref 30.0–36.0)
MCV: 89.3 fL (ref 78.0–100.0)
PLATELETS: 230 10*3/uL (ref 150–400)
RBC: 3.26 MIL/uL — AB (ref 3.87–5.11)
RDW: 13.6 % (ref 11.5–15.5)
WBC: 5.2 10*3/uL (ref 4.0–10.5)

## 2014-05-16 LAB — RENAL FUNCTION PANEL
ALBUMIN: 2.9 g/dL — AB (ref 3.5–5.2)
ALBUMIN: 3 g/dL — AB (ref 3.5–5.2)
ANION GAP: 13 (ref 5–15)
Anion gap: 15 (ref 5–15)
BUN: 35 mg/dL — AB (ref 6–23)
BUN: 38 mg/dL — AB (ref 6–23)
CALCIUM: 8.3 mg/dL — AB (ref 8.4–10.5)
CHLORIDE: 93 mmol/L — AB (ref 96–112)
CO2: 24 mmol/L (ref 19–32)
CO2: 26 mmol/L (ref 19–32)
Calcium: 7.1 mg/dL — ABNORMAL LOW (ref 8.4–10.5)
Chloride: 94 mmol/L — ABNORMAL LOW (ref 96–112)
Creatinine, Ser: 12.26 mg/dL — ABNORMAL HIGH (ref 0.50–1.10)
Creatinine, Ser: 12.83 mg/dL — ABNORMAL HIGH (ref 0.50–1.10)
GFR calc Af Amer: 4 mL/min — ABNORMAL LOW (ref >90)
GFR calc Af Amer: 3 mL/min — ABNORMAL LOW (ref >90)
GFR calc non Af Amer: 3 mL/min — ABNORMAL LOW (ref >90)
GFR calc non Af Amer: 3 mL/min — ABNORMAL LOW (ref >90)
GLUCOSE: 77 mg/dL (ref 70–99)
Glucose, Bld: 117 mg/dL — ABNORMAL HIGH (ref 70–99)
PHOSPHORUS: 4.2 mg/dL (ref 2.3–4.6)
POTASSIUM: 4.1 mmol/L (ref 3.5–5.1)
Phosphorus: 4.1 mg/dL (ref 2.3–4.6)
Potassium: 4.3 mmol/L (ref 3.5–5.1)
Sodium: 132 mmol/L — ABNORMAL LOW (ref 135–145)
Sodium: 133 mmol/L — ABNORMAL LOW (ref 135–145)

## 2014-05-16 LAB — PROTIME-INR
INR: 1.08 (ref 0.00–1.49)
PROTHROMBIN TIME: 14.1 s (ref 11.6–15.2)

## 2014-05-16 MED ORDER — MIDAZOLAM HCL 2 MG/2ML IJ SOLN
INTRAMUSCULAR | Status: AC | PRN
  Administered 2014-05-16: 1 mg via INTRAVENOUS

## 2014-05-16 MED ORDER — FENTANYL CITRATE (PF) 100 MCG/2ML IJ SOLN
INTRAMUSCULAR | Status: AC
  Filled 2014-05-16: qty 2

## 2014-05-16 MED ORDER — ALTEPLASE 100 MG IV SOLR
2.0000 mg | Freq: Once | INTRAVENOUS | Status: DC
  Filled 2014-05-16: qty 2

## 2014-05-16 MED ORDER — ALTEPLASE 100 MG IV SOLR: 2.0000 mg | Freq: Once | INTRAVENOUS | Status: DC

## 2014-05-16 MED ORDER — VANCOMYCIN HCL IN DEXTROSE 1-5 GM/200ML-% IV SOLN
1000.0000 mg | Freq: Once | INTRAVENOUS | Status: DC
Start: 2014-05-16 — End: 2014-05-16
  Administered 2014-05-16: 1000 mg via INTRAVENOUS

## 2014-05-16 MED ORDER — HEPARIN SODIUM (PORCINE) 1000 UNIT/ML IJ SOLN
INTRAMUSCULAR | Status: AC
  Filled 2014-05-16: qty 1

## 2014-05-16 MED ORDER — LIDOCAINE HCL 1 % IJ SOLN
INTRAMUSCULAR | Status: AC
  Filled 2014-05-16: qty 20

## 2014-05-16 MED ORDER — MIDAZOLAM HCL 2 MG/2ML IJ SOLN
INTRAMUSCULAR | Status: AC
  Filled 2014-05-16: qty 2

## 2014-05-16 MED ORDER — NA FERRIC GLUC CPLX IN SUCROSE 12.5 MG/ML IV SOLN
62.5000 mg | Freq: Once | INTRAVENOUS | Status: AC
  Filled 2014-05-16: qty 5

## 2014-05-16 MED ORDER — HEPARIN SOD (PORK) LOCK FLUSH 100 UNIT/ML IV SOLN
INTRAVENOUS | Status: AC
  Filled 2014-05-16: qty 20

## 2014-05-16 MED ORDER — VANCOMYCIN HCL IN DEXTROSE 1-5 GM/200ML-% IV SOLN
1000.0000 mg | INTRAVENOUS | Status: DC
  Filled 2014-05-16: qty 200

## 2014-05-16 MED ORDER — FENTANYL CITRATE (PF) 100 MCG/2ML IJ SOLN
INTRAMUSCULAR | Status: AC | PRN
  Administered 2014-05-16: 50 ug via INTRAVENOUS

## 2014-05-16 NOTE — Sedation Documentation (Signed)
Ok to give vancomycin per md hoss

## 2014-05-16 NOTE — Sedation Documentation (Signed)
Patient is resting comfortably. 

## 2014-05-16 NOTE — Sedation Documentation (Signed)
Patient is resting comfortably. Procedure finished, tolerated wel

## 2014-05-16 NOTE — Consult Note (Signed)
Chief Complaint: Clotted RUA dialysis graft  Referring Physician(s): Dr Posey Pronto  History of Present Illness: [name] is a 54 y.o. female   ESRD Last use RUA dialysis graft was 4/26 Ran well without issue 4/28; clotted and no dialysis Request made for thrombolysis of access Of note; pt has just had parathyroidectomy Mon 4/25 Discussed with Dr Barbie Banner Not candidate for thrombolysis after recent surgery Will move forward with tunnelled dialysis catheter placement today  Past Medical History  Diagnosis Date  . HTN (hypertension)   . Pancreatitis   . Anxiety   . Anemia   . Claustrophobia   . ESRD (end stage renal disease) on dialysis     "TTS; [place]" (05/12/2014)  . Glomerulonephritis   . Secondary hyperparathyroidism     /notes 05/11/2014    Past Surgical History  Procedure Laterality Date  . Kidney transplant  1997  . Arteriovenous graft placement Right 1990's?  . Dilatation & currettage/hysteroscopy with resectocope N/A 04/17/2013    Procedure: DILATATION & CURETTAGE/HYSTEROSCOPY WITH RESECTOCOPE;  Surgeon: Marvene Staff, MD;  Location: [facility];  Service: Gynecology;  Laterality: N/A;  . Colonoscopy    . Peritoneal catheter insertion    . Parathyroidectomy  03/2000 05/12/2014    w/neck exploration & autotransplantation/notes 06/02/2010; w/neck exploration  . Dilation and curettage of uterus    . Peritoneal catheter removal  08/1999    [name] 06/02/2010  . Breast biopsy Left   . Thrombectomy and revision of arterioventous (av) goretex  graft Right 01/2002; 11/2003; 01/2004; 08/07/2004; 08/10/2004; 11/13/2005    [name] 5/1/2012Marland Kitchen [name] 5/16/2012Marland Kitchen [name] 5/16/2012Marland Kitchen [name] 5/16/2012Marland Kitchen [name] 06/02/2010; [name] 06/02/2010  . Av fistula placement Left 09/2000    upper arm/notes 06/02/2010  . Thrombectomy Left 02/2002    fistula/notes 06/02/2010  . Arteriovenous graft placement Left 07/2002    upper arm/notes 06/02/2010  . Thrombectomy and revision of  arterioventous (av) goretex  graft Left 08/23/2002; 09/13/2002; 07/08/2003    [name] 5/16/2012Marland Kitchen [name] 5/16/2012Marland Kitchen [name] 06/02/2010  . Parathyroidectomy N/A 05/12/2014    Procedure: PARATHYROIDECTOMY AND NECK EXPLORATION;  Surgeon: Armandina Gemma, MD;  Location: [facility];  Service: General;  Laterality: N/A;    Allergies: Ace inhibitors; Codeine; and Penicillins  Medications: Prior to Admission medications   Medication Sig Start Date End Date Taking? Authorizing Provider  ALPRAZolam Duanne Moron) 0.25 MG tablet Take 0.25 mg by mouth at bedtime as needed for anxiety.   Yes Historical Provider, MD  metoprolol tartrate (LOPRESSOR) 25 MG tablet Take 25 mg by mouth daily.    Yes Historical Provider, MD  sevelamer (RENAGEL) 800 MG tablet Take 1,600 mg by mouth 3 (three) times daily with meals.   Yes Historical Provider, MD  HYDROcodone-acetaminophen (NORCO/VICODIN) 5-325 MG per tablet Take 1-2 tablets by mouth every 4 (four) hours as needed for moderate pain. 05/13/14   Armandina Gemma, MD     Family History  Problem Relation Age of Onset  . Adopted: Yes  . Diabetes Maternal Aunt     x2  . Diabetes Maternal Uncle     History   Social History  . Marital Status: Divorced    Spouse Name: N/A  . Number of Children: 1  . Years of Education: N/A   Social History Main Topics  . Smoking status: Current Every Day Smoker -- 0.25 packs/day for 20 years    Types: Cigarettes  . Smokeless tobacco: Never Used  . Alcohol Use: No  . Drug Use: No  .  Sexual Activity: Not on file   Other Topics Concern  . None   Social History Narrative     Review of Systems: A 12 point ROS discussed and pertinent positives are indicated in the HPI above.  All other systems are negative.  Review of Systems  Constitutional: Negative for fever, activity change and appetite change.  Respiratory: Negative for shortness of breath.   Neurological: Negative for weakness.  Psychiatric/Behavioral: Negative for behavioral problems and  confusion.    Vital Signs: BP 160/87 mmHg  Pulse 71  Temp(Src) 98.5 F (36.9 C) (Oral)  Resp 16  Ht 5\' 7"  (1.702 m)  Wt 66 kg (145 lb 8.1 oz)  BMI 22.78 kg/m2  SpO2 100%  Physical Exam  Constitutional: She is oriented to person, place, and time. She appears well-nourished.  Cardiovascular: Normal rate, regular rhythm and normal heart sounds.   No murmur heard. Pulmonary/Chest: Effort normal and breath sounds normal. She has no wheezes.  Abdominal: Soft. Bowel sounds are normal. There is no tenderness.  Musculoskeletal: Normal range of motion.  RUA aneurysmal dialysis graft Faint pulse distally  Neurological: She is alert and oriented to person, place, and time.  Skin: Skin is warm and dry.  Psychiatric: She has a normal mood and affect. Her behavior is normal. Judgment and thought content normal.  Nursing note and vitals reviewed.   Mallampati Score:  MD Evaluation Airway: WNL Heart: WNL Abdomen: WNL Chest/ Lungs: WNL ASA  Classification: 3 Mallampati/Airway Score: One  Imaging: Dg Chest 2 View  04/22/2014   CLINICAL DATA:  Left-sided chest discomfort, shortness of breath  EXAM: CHEST  2 VIEW  COMPARISON:  Chest x-ray of 06/22/2007  FINDINGS: No active infiltrate or effusion is seen. Mediastinal and hilar contours are unremarkable. The heart is within normal limits in size. 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 There is a thoracic scoliosis present.  IMPRESSION: No active cardiopulmonary disease. Bony changes of renal osteodystrophy.   Electronically Signed   By: Ivar Drape M.D.   On: 04/22/2014 15:29    Labs:  CBC:  Recent Labs  05/12/14 0644 05/12/14 0743 05/13/14 1400  WBC 5.5  --  5.9  HGB 10.6* 11.2* 9.7*  HCT 31.8* 33.0* 28.9*  PLT 219  --  222    COAGS:  Recent Labs  05/16/14 0932  INR 1.08    BMP:  Recent Labs  05/13/14 1400 05/14/14 0509 05/15/14 1808 05/16/14 0323  NA 131* 133* 133* 132*  K 4.0 3.6 4.8 4.1  CL 94*  95* 94* 93*  CO2 22 26 26 24   GLUCOSE 95 78 80 77  BUN 44* 19 31* 35*  CALCIUM 6.0* 6.9* 6.4* 8.3*  CREATININE 11.88* 7.50* 10.79* 12.26*  GFRNONAA 3* 6* 4* 3*  GFRAA 4* 6* 4* 4*    LIVER FUNCTION TESTS:  Recent Labs  05/13/14 1400 05/14/14 0509 05/15/14 1808 05/16/14 0323  ALBUMIN 2.9* 2.8* 2.9* 2.9*    TUMOR MARKERS: No results for input(s): AFPTM, CEA, CA199, CHROMGRNA in the last 8760 hours.  Assessment and Plan:  Clotted RUA dialysis graft Initially scheduled for possible thrombolysis with poss angioplasty/stent placement Known recent surgery for parathyroidectomy 4/25 Cannot do thrombolysis safely with recent surgery Now will move ahead with tunneled dialysis catheter Risks and Benefits discussed with the patient including, but not limited to bleeding, infection, vascular injury, pneumothorax which may require chest tube placement, air embolism or even death All of the patient's questions were answered, patient is agreeable  to proceed. Consent signed and in chart.   Thank you for this interesting consult.  I greatly enjoyed meeting Bryleigh L Cana and look forward to participating in their care.  Signed: Jacqualyn Sedgwick A 05/16/2014, 11:07 AM   I spent a total of 40 Minutes  in face to face in clinical consultation, greater than 50% of which was counseling/coordinating care for tunneled HD cath

## 2014-05-16 NOTE — Progress Notes (Signed)
Patient ID: [name], female   DOB: 1960-12-19, 54 y.o.   MRN: [id number]  [facility]  POD#: 4  Subjective: Patient comfortable, no complaints.  Awaiting IR evaluation for thrombosed AVG.  Tolerating diet.  Objective: Vital signs in last 24 hours: Temp:  [98.2 F (36.8 C)-98.6 F (37 C)] 98.5 F (36.9 C) (04/29 0612) Pulse Rate:  [71-76] 71 (04/29 0612) Resp:  [15-16] 16 (04/29 0612) BP: (134-160)/(82-93) 160/87 mmHg (04/29 0612) SpO2:  [98 %-100 %] 100 % (04/29 0612) Last BM Date: 05/13/14  Intake/Output from previous day: 04/28 0701 - 04/29 0700 In: 360 [P.O.:360] Out: 0  Intake/Output this shift:    Physical Exam: HEENT - sclerae clear, mucous membranes moist Neck - wound clear and dry and intact with steristrips in place Ext - no edema, non-tender Neuro - alert & oriented, no focal deficits  Lab Results:   Recent Labs  05/13/14 1400  WBC 5.9  HGB 9.7*  HCT 28.9*  PLT 222   BMET  Recent Labs  05/15/14 1808 05/16/14 0323  NA 133* 132*  K 4.8 4.1  CL 94* 93*  CO2 26 24  GLUCOSE 80 77  BUN 31* 35*  CREATININE 10.79* 12.26*  CALCIUM 6.4* 8.3*   PT/INR  Recent Labs  05/16/14 0932  LABPROT 14.1  INR 1.08   Comprehensive Metabolic Panel:    Component Value Date/Time   NA 132* 05/16/2014 0323   NA 133* 05/15/2014 1808   K 4.1 05/16/2014 0323   K 4.8 05/15/2014 1808   CL 93* 05/16/2014 0323   CL 94* 05/15/2014 1808   CO2 24 05/16/2014 0323   CO2 26 05/15/2014 1808   BUN 35* 05/16/2014 0323   BUN 31* 05/15/2014 1808   CREATININE 12.26* 05/16/2014 0323   CREATININE 10.79* 05/15/2014 1808   GLUCOSE 77 05/16/2014 0323   GLUCOSE 80 05/15/2014 1808   CALCIUM 8.3* 05/16/2014 0323   CALCIUM 6.4* 05/15/2014 1808   AST 15 01/06/2007 1215   AST 14 01/05/2007 0510   ALT <8 01/06/2007 1215   ALT <8 01/05/2007 0510   ALKPHOS 48 01/06/2007 1215   ALKPHOS 60 01/05/2007 0510   BILITOT 0.9 01/06/2007 1215   BILITOT 1.1 01/05/2007 0510   PROT 6.6 01/06/2007 1215   PROT 7.2 01/05/2007 0510   ALBUMIN 2.9* 05/16/2014 0323   ALBUMIN 2.9* 05/15/2014 1808    Studies/Results: No results found.  Anti-infectives: Anti-infectives    Start     Dose/Rate Route Frequency Ordered Stop   05/12/14 0600  ciprofloxacin (CIPRO) IVPB 400 mg     400 mg 200 mL/hr over 60 Minutes Intravenous On call to O.R. 05/11/14 1426 05/12/14 0800      Assessment & Plans: Status post thyroid lobectomy / parathyroidectomy  Calcium now up to 8.3 mg/dl with supplementation by renal  Will check TSH level in 1 month Thrombosed AVG  Per renal and vascular surgery services  Plan IR evaluation this AM  Home when stable from renal standpoint  Earnstine Regal, MD, [facility] Office: [place] 05/16/2014

## 2014-05-16 NOTE — Progress Notes (Addendum)
Patient ID: [name], female   DOB: 09-25-1960, 54 y.o.   MRN: [id number]  [facility] Progress Note   Assessment/ Plan:   1. Secondary hyperparathyroidism- sp R Thyroidectomy with Parathyroid gland present in R thyroid- Lobe. Labs this morning show acceptable calcium levels and she is asymptomatic  for hypocalcemia.  2. ESRD - Unable to get HD yesterday due to thrombosed arteriovenous graft-hemodialysis catheter will be placed today by interventional radiology as thrombectomy felt to be unsafe with recent surgery. Plans for hemodialysis later today after dialysis catheter placed. 3. Hypertension/volume - BP Stablenow / no change in edw/ on lopressor 4. Anemia - hgb =9.7 no esa Restart today And At dc / fe wkly 5. Metabolic bone disease - improving calcium levels following repletion for hungry bone syndrome. Phosphorus levels to be followed.  6. Anxiety - on Xanax prn 7. Thrombosed RUA arc AVG: seen by vascular surgery yesterday who felt that the graft might have been stented at [facility]-I have reviewed the patient's records, her only visit there was last month when she underwent 45mm angioplasty at the venous anastomosis and intragraft segments. She does have a calcified graft that may have misled Dr. Bridgett Larsson to feel that this was a stent. I have placed her procedure note from [facility] in her shadow chart.   Subjective:   Reports to be feeling fair-somewhat distraught that her graft clotted    Objective:   BP 160/87 mmHg  Pulse 71  Temp(Src) 98.5 F (36.9 C) (Oral)  Resp 16  Ht 5\' 7"  (1.702 m)  Wt 66 kg (145 lb 8.1 oz)  BMI 22.78 kg/m2  SpO2 100%  Physical Exam: Gen: Comfortably sitting up in bed-daughter and mother at bedside CVS Pulse regular in rate and rhythm, S1 and S2 normal Resp: Clear to auscultation, no rales/rhonchi  Abd: Soft, flat, nontender  Ext: No lower extremity edema, right upper arm are graft thrombosed   Labs: BMET  Recent Labs Lab  05/12/14 0644 05/12/14 0743 05/12/14 1831 05/13/14 1400 05/14/14 0509 05/15/14 1808 05/16/14 0323  NA 132* 135 134* 131* 133* 133* 132*  K 3.6 4.1 4.2 4.0 3.6 4.8 4.1  CL 92*  --  95* 94* 95* 94* 93*  CO2 24  --  23 22 26 26 24   GLUCOSE 82 76 96 95 78 80 77  BUN 33*  --  37* 44* 19 31* 35*  CREATININE 9.05*  --  9.90* 11.88* 7.50* 10.79* 12.26*  CALCIUM 9.5  --  7.6* 6.0* 6.9* 6.4* 8.3*  PHOS  --   --  6.4* 5.1* 4.1 4.3 4.2   CBC  Recent Labs Lab 05/12/14 0644 05/12/14 0743 05/13/14 1400  WBC 5.5  --  5.9  HGB 10.6* 11.2* 9.7*  HCT 31.8* 33.0* 28.9*  MCV 89.8  --  90.0  PLT 219  --  222   Medications:    . calcitRIOL  2 mcg Oral BID  . calcium carbonate  3 tablet Oral TID BM  . calcium carbonate  3 tablet Oral QHS  . docusate sodium  100 mg Oral BID  . feeding supplement (ENSURE ENLIVE)  237 mL Oral BID BM  . ferric gluconate (FERRLECIT/NULECIT) IV  62.5 mg Intravenous Q Thu-HD  . vancomycin  1,000 mg Intravenous to Frederik Pear, MD 05/16/2014, 12:03 PM

## 2014-05-16 NOTE — Procedures (Signed)
RIJV HD cath 23 cm SVC RA No comp

## 2014-05-16 NOTE — Progress Notes (Signed)
Pt is NPO awaiting IR procedure to declot AV fistula today. Activase tubed to 17 per IR staff request at [phone].

## 2014-05-17 DIAGNOSIS — N186 End stage renal disease: Secondary | ICD-10-CM | POA: Diagnosis not present

## 2014-05-17 DIAGNOSIS — F17200 Nicotine dependence, unspecified, uncomplicated: Secondary | ICD-10-CM | POA: Diagnosis not present

## 2014-05-17 DIAGNOSIS — N033 Chronic nephritic syndrome with diffuse mesangial proliferative glomerulonephritis: Secondary | ICD-10-CM | POA: Diagnosis not present

## 2014-05-17 DIAGNOSIS — I120 Hypertensive chronic kidney disease with stage 5 chronic kidney disease or end stage renal disease: Secondary | ICD-10-CM | POA: Diagnosis not present

## 2014-05-17 DIAGNOSIS — N2581 Secondary hyperparathyroidism of renal origin: Secondary | ICD-10-CM | POA: Diagnosis not present

## 2014-05-17 DIAGNOSIS — Z992 Dependence on renal dialysis: Secondary | ICD-10-CM | POA: Diagnosis not present

## 2014-05-17 DIAGNOSIS — F419 Anxiety disorder, unspecified: Secondary | ICD-10-CM | POA: Diagnosis not present

## 2014-05-17 LAB — RENAL FUNCTION PANEL
ALBUMIN: 3 g/dL — AB (ref 3.5–5.2)
ANION GAP: 11 (ref 5–15)
BUN: 15 mg/dL (ref 6–23)
CO2: 25 mmol/L (ref 19–32)
Calcium: 7.7 mg/dL — ABNORMAL LOW (ref 8.4–10.5)
Chloride: 99 mmol/L (ref 96–112)
Creatinine, Ser: 7.49 mg/dL — ABNORMAL HIGH (ref 0.50–1.10)
GFR calc Af Amer: 6 mL/min — ABNORMAL LOW (ref >90)
GFR, EST NON AFRICAN AMERICAN: 6 mL/min — AB (ref >90)
Glucose, Bld: 77 mg/dL (ref 70–99)
PHOSPHORUS: 3.2 mg/dL (ref 2.3–4.6)
POTASSIUM: 4.9 mmol/L (ref 3.5–5.1)
Sodium: 135 mmol/L (ref 135–145)

## 2014-05-17 NOTE — Care Management (Signed)
Utilization Review completed.  

## 2014-05-17 NOTE — Progress Notes (Signed)
Reviewed AVS with pt who verbalizes understanding. Instructed about Tums EX per Dr. Ena Dawley note for calcium replacement. Copy to pt. Will dc to private car home with family and all personal belongings. To f/u with her HD center.

## 2014-05-17 NOTE — Discharge Summary (Signed)
Physician Discharge Summary  Patient ID: [name] MRN: [id number] DOB/AGE: [birth year] 54 y.o.  Admit date: 05/12/2014 Discharge date: 05/17/2014  Admission Diagnoses:  Recurrent secondary hyperparathyroism  Discharge Diagnoses:  Same post neck exploration  Active Problems:   Nonpatent hemodialysis access site   Surgery:  Right thyroid lobectomy  Discharged Condition: improved  Hospital Course:   Had right thyroid lobectomy.  HD in hospital complicated by clotting of right AV fistula for HD.  Had consult by vascular and right vascath placed by IR.  She will followup with [facility] as an outpatient for declotting  Consults: vascular, IR  Significant Diagnostic Studies: path pending    Discharge Exam: Blood pressure 143/70, pulse 72, temperature 97.9 F (36.6 C), temperature source Oral, resp. rate 16, height 5\' 7"  (1.702 m), weight 63.5 kg (139 lb 15.9 oz), SpO2 97 %. Incisions OK.  Voice OK.  Feeling good and wanting to go home  Disposition: 01-Home or Self Care  Discharge Instructions    Call MD for:  redness, tenderness, or signs of infection (pain, swelling, redness, odor or green/yellow discharge around incision site)    Complete by:  As directed      Call MD for:  temperature >100.4    Complete by:  As directed      Discharge instructions    Complete by:  As directed   Use ice pack and prescription for pain as provided by Dr. Harlow Asa May shower     Increase activity slowly    Complete by:  As directed             Medication List    TAKE these medications        ALPRAZolam 0.25 MG tablet  Commonly known as:  XANAX  Take 0.25 mg by mouth at bedtime as needed for anxiety.     HYDROcodone-acetaminophen 5-325 MG per tablet  Commonly known as:  NORCO/VICODIN  Take 1-2 tablets by mouth every 4 (four) hours as needed for moderate pain.     metoprolol tartrate 25 MG tablet  Commonly known as:  LOPRESSOR  Take 25 mg by mouth daily.     sevelamer 800 MG  tablet  Commonly known as:  RENAGEL  Take 1,600 mg by mouth 3 (three) times daily with meals.           Follow-up Information    Follow up with Earnstine Regal, MD. Schedule an appointment as soon as possible for a visit in 3 weeks.   Specialty:  General Surgery   Why:  For wound re-check   Contact information:   [address] [place] [address] [phone]       Signed: Pedro Earls 05/17/2014, 7:22 AM

## 2014-05-19 DIAGNOSIS — Z992 Dependence on renal dialysis: Secondary | ICD-10-CM | POA: Diagnosis not present

## 2014-05-19 DIAGNOSIS — E892 Postprocedural hypoparathyroidism: Secondary | ICD-10-CM | POA: Diagnosis not present

## 2014-05-19 DIAGNOSIS — I871 Compression of vein: Secondary | ICD-10-CM | POA: Diagnosis not present

## 2014-05-19 DIAGNOSIS — D631 Anemia in chronic kidney disease: Secondary | ICD-10-CM | POA: Diagnosis not present

## 2014-05-19 DIAGNOSIS — R509 Fever, unspecified: Secondary | ICD-10-CM | POA: Diagnosis not present

## 2014-05-19 DIAGNOSIS — N186 End stage renal disease: Secondary | ICD-10-CM | POA: Diagnosis not present

## 2014-05-19 DIAGNOSIS — N2581 Secondary hyperparathyroidism of renal origin: Secondary | ICD-10-CM | POA: Diagnosis not present

## 2014-05-19 DIAGNOSIS — T82858D Stenosis of vascular prosthetic devices, implants and grafts, subsequent encounter: Secondary | ICD-10-CM | POA: Diagnosis not present

## 2014-05-19 DIAGNOSIS — D509 Iron deficiency anemia, unspecified: Secondary | ICD-10-CM | POA: Diagnosis not present

## 2014-05-22 DIAGNOSIS — N2581 Secondary hyperparathyroidism of renal origin: Secondary | ICD-10-CM | POA: Diagnosis not present

## 2014-05-22 DIAGNOSIS — N186 End stage renal disease: Secondary | ICD-10-CM | POA: Diagnosis not present

## 2014-05-22 DIAGNOSIS — D631 Anemia in chronic kidney disease: Secondary | ICD-10-CM | POA: Diagnosis not present

## 2014-05-22 DIAGNOSIS — R509 Fever, unspecified: Secondary | ICD-10-CM | POA: Diagnosis not present

## 2014-05-22 DIAGNOSIS — D509 Iron deficiency anemia, unspecified: Secondary | ICD-10-CM | POA: Diagnosis not present

## 2014-05-22 DIAGNOSIS — E892 Postprocedural hypoparathyroidism: Secondary | ICD-10-CM | POA: Diagnosis not present

## 2014-05-24 DIAGNOSIS — E892 Postprocedural hypoparathyroidism: Secondary | ICD-10-CM | POA: Diagnosis not present

## 2014-05-24 DIAGNOSIS — D631 Anemia in chronic kidney disease: Secondary | ICD-10-CM | POA: Diagnosis not present

## 2014-05-24 DIAGNOSIS — N2581 Secondary hyperparathyroidism of renal origin: Secondary | ICD-10-CM | POA: Diagnosis not present

## 2014-05-24 DIAGNOSIS — R509 Fever, unspecified: Secondary | ICD-10-CM | POA: Diagnosis not present

## 2014-05-24 DIAGNOSIS — N186 End stage renal disease: Secondary | ICD-10-CM | POA: Diagnosis not present

## 2014-05-24 DIAGNOSIS — D509 Iron deficiency anemia, unspecified: Secondary | ICD-10-CM | POA: Diagnosis not present

## 2014-05-27 DIAGNOSIS — N2581 Secondary hyperparathyroidism of renal origin: Secondary | ICD-10-CM | POA: Diagnosis not present

## 2014-05-27 DIAGNOSIS — D509 Iron deficiency anemia, unspecified: Secondary | ICD-10-CM | POA: Diagnosis not present

## 2014-05-27 DIAGNOSIS — D631 Anemia in chronic kidney disease: Secondary | ICD-10-CM | POA: Diagnosis not present

## 2014-05-27 DIAGNOSIS — N186 End stage renal disease: Secondary | ICD-10-CM | POA: Diagnosis not present

## 2014-05-27 DIAGNOSIS — R509 Fever, unspecified: Secondary | ICD-10-CM | POA: Diagnosis not present

## 2014-05-27 DIAGNOSIS — E892 Postprocedural hypoparathyroidism: Secondary | ICD-10-CM | POA: Diagnosis not present

## 2014-05-29 DIAGNOSIS — N186 End stage renal disease: Secondary | ICD-10-CM | POA: Diagnosis not present

## 2014-05-29 DIAGNOSIS — D509 Iron deficiency anemia, unspecified: Secondary | ICD-10-CM | POA: Diagnosis not present

## 2014-05-29 DIAGNOSIS — R509 Fever, unspecified: Secondary | ICD-10-CM | POA: Diagnosis not present

## 2014-05-29 DIAGNOSIS — E892 Postprocedural hypoparathyroidism: Secondary | ICD-10-CM | POA: Diagnosis not present

## 2014-05-29 DIAGNOSIS — D631 Anemia in chronic kidney disease: Secondary | ICD-10-CM | POA: Diagnosis not present

## 2014-05-29 DIAGNOSIS — N2581 Secondary hyperparathyroidism of renal origin: Secondary | ICD-10-CM | POA: Diagnosis not present

## 2014-05-31 DIAGNOSIS — D631 Anemia in chronic kidney disease: Secondary | ICD-10-CM | POA: Diagnosis not present

## 2014-05-31 DIAGNOSIS — N2581 Secondary hyperparathyroidism of renal origin: Secondary | ICD-10-CM | POA: Diagnosis not present

## 2014-05-31 DIAGNOSIS — D509 Iron deficiency anemia, unspecified: Secondary | ICD-10-CM | POA: Diagnosis not present

## 2014-05-31 DIAGNOSIS — N186 End stage renal disease: Secondary | ICD-10-CM | POA: Diagnosis not present

## 2014-05-31 DIAGNOSIS — R509 Fever, unspecified: Secondary | ICD-10-CM | POA: Diagnosis not present

## 2014-05-31 DIAGNOSIS — E892 Postprocedural hypoparathyroidism: Secondary | ICD-10-CM | POA: Diagnosis not present

## 2014-06-03 DIAGNOSIS — N2581 Secondary hyperparathyroidism of renal origin: Secondary | ICD-10-CM | POA: Diagnosis not present

## 2014-06-03 DIAGNOSIS — N186 End stage renal disease: Secondary | ICD-10-CM | POA: Diagnosis not present

## 2014-06-03 DIAGNOSIS — D631 Anemia in chronic kidney disease: Secondary | ICD-10-CM | POA: Diagnosis not present

## 2014-06-03 DIAGNOSIS — R509 Fever, unspecified: Secondary | ICD-10-CM | POA: Diagnosis not present

## 2014-06-03 DIAGNOSIS — D509 Iron deficiency anemia, unspecified: Secondary | ICD-10-CM | POA: Diagnosis not present

## 2014-06-03 DIAGNOSIS — E892 Postprocedural hypoparathyroidism: Secondary | ICD-10-CM | POA: Diagnosis not present

## 2014-06-05 DIAGNOSIS — N2581 Secondary hyperparathyroidism of renal origin: Secondary | ICD-10-CM | POA: Diagnosis not present

## 2014-06-05 DIAGNOSIS — R509 Fever, unspecified: Secondary | ICD-10-CM | POA: Diagnosis not present

## 2014-06-05 DIAGNOSIS — D631 Anemia in chronic kidney disease: Secondary | ICD-10-CM | POA: Diagnosis not present

## 2014-06-05 DIAGNOSIS — D509 Iron deficiency anemia, unspecified: Secondary | ICD-10-CM | POA: Diagnosis not present

## 2014-06-05 DIAGNOSIS — E892 Postprocedural hypoparathyroidism: Secondary | ICD-10-CM | POA: Diagnosis not present

## 2014-06-05 DIAGNOSIS — N186 End stage renal disease: Secondary | ICD-10-CM | POA: Diagnosis not present

## 2014-06-07 DIAGNOSIS — R509 Fever, unspecified: Secondary | ICD-10-CM | POA: Diagnosis not present

## 2014-06-07 DIAGNOSIS — D509 Iron deficiency anemia, unspecified: Secondary | ICD-10-CM | POA: Diagnosis not present

## 2014-06-07 DIAGNOSIS — N2581 Secondary hyperparathyroidism of renal origin: Secondary | ICD-10-CM | POA: Diagnosis not present

## 2014-06-07 DIAGNOSIS — E892 Postprocedural hypoparathyroidism: Secondary | ICD-10-CM | POA: Diagnosis not present

## 2014-06-07 DIAGNOSIS — D631 Anemia in chronic kidney disease: Secondary | ICD-10-CM | POA: Diagnosis not present

## 2014-06-07 DIAGNOSIS — N186 End stage renal disease: Secondary | ICD-10-CM | POA: Diagnosis not present

## 2014-06-10 DIAGNOSIS — E892 Postprocedural hypoparathyroidism: Secondary | ICD-10-CM | POA: Diagnosis not present

## 2014-06-10 DIAGNOSIS — R509 Fever, unspecified: Secondary | ICD-10-CM | POA: Diagnosis not present

## 2014-06-10 DIAGNOSIS — N2581 Secondary hyperparathyroidism of renal origin: Secondary | ICD-10-CM | POA: Diagnosis not present

## 2014-06-10 DIAGNOSIS — D631 Anemia in chronic kidney disease: Secondary | ICD-10-CM | POA: Diagnosis not present

## 2014-06-10 DIAGNOSIS — N186 End stage renal disease: Secondary | ICD-10-CM | POA: Diagnosis not present

## 2014-06-10 DIAGNOSIS — D509 Iron deficiency anemia, unspecified: Secondary | ICD-10-CM | POA: Diagnosis not present

## 2014-06-12 DIAGNOSIS — R509 Fever, unspecified: Secondary | ICD-10-CM | POA: Diagnosis not present

## 2014-06-12 DIAGNOSIS — N2581 Secondary hyperparathyroidism of renal origin: Secondary | ICD-10-CM | POA: Diagnosis not present

## 2014-06-12 DIAGNOSIS — E892 Postprocedural hypoparathyroidism: Secondary | ICD-10-CM | POA: Diagnosis not present

## 2014-06-12 DIAGNOSIS — D631 Anemia in chronic kidney disease: Secondary | ICD-10-CM | POA: Diagnosis not present

## 2014-06-12 DIAGNOSIS — N186 End stage renal disease: Secondary | ICD-10-CM | POA: Diagnosis not present

## 2014-06-12 DIAGNOSIS — D509 Iron deficiency anemia, unspecified: Secondary | ICD-10-CM | POA: Diagnosis not present

## 2014-06-14 DIAGNOSIS — D509 Iron deficiency anemia, unspecified: Secondary | ICD-10-CM | POA: Diagnosis not present

## 2014-06-14 DIAGNOSIS — N2581 Secondary hyperparathyroidism of renal origin: Secondary | ICD-10-CM | POA: Diagnosis not present

## 2014-06-14 DIAGNOSIS — R509 Fever, unspecified: Secondary | ICD-10-CM | POA: Diagnosis not present

## 2014-06-14 DIAGNOSIS — E892 Postprocedural hypoparathyroidism: Secondary | ICD-10-CM | POA: Diagnosis not present

## 2014-06-14 DIAGNOSIS — D631 Anemia in chronic kidney disease: Secondary | ICD-10-CM | POA: Diagnosis not present

## 2014-06-14 DIAGNOSIS — N186 End stage renal disease: Secondary | ICD-10-CM | POA: Diagnosis not present

## 2014-06-17 DIAGNOSIS — D509 Iron deficiency anemia, unspecified: Secondary | ICD-10-CM | POA: Diagnosis not present

## 2014-06-17 DIAGNOSIS — R509 Fever, unspecified: Secondary | ICD-10-CM | POA: Diagnosis not present

## 2014-06-17 DIAGNOSIS — N2581 Secondary hyperparathyroidism of renal origin: Secondary | ICD-10-CM | POA: Diagnosis not present

## 2014-06-17 DIAGNOSIS — Z992 Dependence on renal dialysis: Secondary | ICD-10-CM | POA: Diagnosis not present

## 2014-06-17 DIAGNOSIS — D631 Anemia in chronic kidney disease: Secondary | ICD-10-CM | POA: Diagnosis not present

## 2014-06-17 DIAGNOSIS — N033 Chronic nephritic syndrome with diffuse mesangial proliferative glomerulonephritis: Secondary | ICD-10-CM | POA: Diagnosis not present

## 2014-06-17 DIAGNOSIS — E892 Postprocedural hypoparathyroidism: Secondary | ICD-10-CM | POA: Diagnosis not present

## 2014-06-17 DIAGNOSIS — N186 End stage renal disease: Secondary | ICD-10-CM | POA: Diagnosis not present

## 2014-06-19 DIAGNOSIS — N2581 Secondary hyperparathyroidism of renal origin: Secondary | ICD-10-CM | POA: Diagnosis not present

## 2014-06-19 DIAGNOSIS — D509 Iron deficiency anemia, unspecified: Secondary | ICD-10-CM | POA: Diagnosis not present

## 2014-06-19 DIAGNOSIS — N186 End stage renal disease: Secondary | ICD-10-CM | POA: Diagnosis not present

## 2014-06-19 DIAGNOSIS — D631 Anemia in chronic kidney disease: Secondary | ICD-10-CM | POA: Diagnosis not present

## 2014-06-21 DIAGNOSIS — D509 Iron deficiency anemia, unspecified: Secondary | ICD-10-CM | POA: Diagnosis not present

## 2014-06-21 DIAGNOSIS — N186 End stage renal disease: Secondary | ICD-10-CM | POA: Diagnosis not present

## 2014-06-21 DIAGNOSIS — N2581 Secondary hyperparathyroidism of renal origin: Secondary | ICD-10-CM | POA: Diagnosis not present

## 2014-06-21 DIAGNOSIS — D631 Anemia in chronic kidney disease: Secondary | ICD-10-CM | POA: Diagnosis not present

## 2014-06-24 DIAGNOSIS — D631 Anemia in chronic kidney disease: Secondary | ICD-10-CM | POA: Diagnosis not present

## 2014-06-24 DIAGNOSIS — D509 Iron deficiency anemia, unspecified: Secondary | ICD-10-CM | POA: Diagnosis not present

## 2014-06-24 DIAGNOSIS — N186 End stage renal disease: Secondary | ICD-10-CM | POA: Diagnosis not present

## 2014-06-24 DIAGNOSIS — N2581 Secondary hyperparathyroidism of renal origin: Secondary | ICD-10-CM | POA: Diagnosis not present

## 2014-06-25 DIAGNOSIS — Z885 Allergy status to narcotic agent status: Secondary | ICD-10-CM | POA: Diagnosis not present

## 2014-06-25 DIAGNOSIS — Z992 Dependence on renal dialysis: Secondary | ICD-10-CM | POA: Diagnosis not present

## 2014-06-25 DIAGNOSIS — Z888 Allergy status to other drugs, medicaments and biological substances status: Secondary | ICD-10-CM | POA: Diagnosis not present

## 2014-06-25 DIAGNOSIS — T82868A Thrombosis of vascular prosthetic devices, implants and grafts, initial encounter: Secondary | ICD-10-CM | POA: Diagnosis not present

## 2014-06-25 DIAGNOSIS — I82611 Acute embolism and thrombosis of superficial veins of right upper extremity: Secondary | ICD-10-CM | POA: Diagnosis not present

## 2014-06-25 DIAGNOSIS — Z880 Allergy status to penicillin: Secondary | ICD-10-CM | POA: Diagnosis not present

## 2014-06-25 DIAGNOSIS — F419 Anxiety disorder, unspecified: Secondary | ICD-10-CM | POA: Diagnosis not present

## 2014-06-25 DIAGNOSIS — N2581 Secondary hyperparathyroidism of renal origin: Secondary | ICD-10-CM | POA: Diagnosis not present

## 2014-06-25 DIAGNOSIS — I120 Hypertensive chronic kidney disease with stage 5 chronic kidney disease or end stage renal disease: Secondary | ICD-10-CM | POA: Diagnosis not present

## 2014-06-25 DIAGNOSIS — R001 Bradycardia, unspecified: Secondary | ICD-10-CM | POA: Diagnosis not present

## 2014-06-25 DIAGNOSIS — N186 End stage renal disease: Secondary | ICD-10-CM | POA: Diagnosis not present

## 2014-06-26 DIAGNOSIS — F419 Anxiety disorder, unspecified: Secondary | ICD-10-CM | POA: Diagnosis not present

## 2014-06-26 DIAGNOSIS — Z992 Dependence on renal dialysis: Secondary | ICD-10-CM | POA: Diagnosis not present

## 2014-06-26 DIAGNOSIS — N186 End stage renal disease: Secondary | ICD-10-CM | POA: Diagnosis not present

## 2014-06-26 DIAGNOSIS — I120 Hypertensive chronic kidney disease with stage 5 chronic kidney disease or end stage renal disease: Secondary | ICD-10-CM | POA: Diagnosis not present

## 2014-06-26 DIAGNOSIS — T82868A Thrombosis of vascular prosthetic devices, implants and grafts, initial encounter: Secondary | ICD-10-CM | POA: Diagnosis not present

## 2014-06-26 DIAGNOSIS — Z885 Allergy status to narcotic agent status: Secondary | ICD-10-CM | POA: Diagnosis not present

## 2014-06-28 DIAGNOSIS — D631 Anemia in chronic kidney disease: Secondary | ICD-10-CM | POA: Diagnosis not present

## 2014-06-28 DIAGNOSIS — N186 End stage renal disease: Secondary | ICD-10-CM | POA: Diagnosis not present

## 2014-06-28 DIAGNOSIS — D509 Iron deficiency anemia, unspecified: Secondary | ICD-10-CM | POA: Diagnosis not present

## 2014-06-28 DIAGNOSIS — N2581 Secondary hyperparathyroidism of renal origin: Secondary | ICD-10-CM | POA: Diagnosis not present

## 2014-07-01 DIAGNOSIS — D509 Iron deficiency anemia, unspecified: Secondary | ICD-10-CM | POA: Diagnosis not present

## 2014-07-01 DIAGNOSIS — D631 Anemia in chronic kidney disease: Secondary | ICD-10-CM | POA: Diagnosis not present

## 2014-07-01 DIAGNOSIS — N186 End stage renal disease: Secondary | ICD-10-CM | POA: Diagnosis not present

## 2014-07-01 DIAGNOSIS — N2581 Secondary hyperparathyroidism of renal origin: Secondary | ICD-10-CM | POA: Diagnosis not present

## 2014-07-03 DIAGNOSIS — D631 Anemia in chronic kidney disease: Secondary | ICD-10-CM | POA: Diagnosis not present

## 2014-07-03 DIAGNOSIS — D509 Iron deficiency anemia, unspecified: Secondary | ICD-10-CM | POA: Diagnosis not present

## 2014-07-03 DIAGNOSIS — N186 End stage renal disease: Secondary | ICD-10-CM | POA: Diagnosis not present

## 2014-07-03 DIAGNOSIS — N2581 Secondary hyperparathyroidism of renal origin: Secondary | ICD-10-CM | POA: Diagnosis not present

## 2014-07-05 DIAGNOSIS — D631 Anemia in chronic kidney disease: Secondary | ICD-10-CM | POA: Diagnosis not present

## 2014-07-05 DIAGNOSIS — N186 End stage renal disease: Secondary | ICD-10-CM | POA: Diagnosis not present

## 2014-07-05 DIAGNOSIS — D509 Iron deficiency anemia, unspecified: Secondary | ICD-10-CM | POA: Diagnosis not present

## 2014-07-05 DIAGNOSIS — N2581 Secondary hyperparathyroidism of renal origin: Secondary | ICD-10-CM | POA: Diagnosis not present

## 2014-07-08 DIAGNOSIS — D509 Iron deficiency anemia, unspecified: Secondary | ICD-10-CM | POA: Diagnosis not present

## 2014-07-08 DIAGNOSIS — N2581 Secondary hyperparathyroidism of renal origin: Secondary | ICD-10-CM | POA: Diagnosis not present

## 2014-07-08 DIAGNOSIS — D631 Anemia in chronic kidney disease: Secondary | ICD-10-CM | POA: Diagnosis not present

## 2014-07-08 DIAGNOSIS — N186 End stage renal disease: Secondary | ICD-10-CM | POA: Diagnosis not present

## 2014-07-10 DIAGNOSIS — D631 Anemia in chronic kidney disease: Secondary | ICD-10-CM | POA: Diagnosis not present

## 2014-07-10 DIAGNOSIS — N2581 Secondary hyperparathyroidism of renal origin: Secondary | ICD-10-CM | POA: Diagnosis not present

## 2014-07-10 DIAGNOSIS — D509 Iron deficiency anemia, unspecified: Secondary | ICD-10-CM | POA: Diagnosis not present

## 2014-07-10 DIAGNOSIS — N186 End stage renal disease: Secondary | ICD-10-CM | POA: Diagnosis not present

## 2014-07-12 DIAGNOSIS — D631 Anemia in chronic kidney disease: Secondary | ICD-10-CM | POA: Diagnosis not present

## 2014-07-12 DIAGNOSIS — N2581 Secondary hyperparathyroidism of renal origin: Secondary | ICD-10-CM | POA: Diagnosis not present

## 2014-07-12 DIAGNOSIS — N186 End stage renal disease: Secondary | ICD-10-CM | POA: Diagnosis not present

## 2014-07-12 DIAGNOSIS — D509 Iron deficiency anemia, unspecified: Secondary | ICD-10-CM | POA: Diagnosis not present

## 2014-07-15 DIAGNOSIS — D509 Iron deficiency anemia, unspecified: Secondary | ICD-10-CM | POA: Diagnosis not present

## 2014-07-15 DIAGNOSIS — D631 Anemia in chronic kidney disease: Secondary | ICD-10-CM | POA: Diagnosis not present

## 2014-07-15 DIAGNOSIS — N186 End stage renal disease: Secondary | ICD-10-CM | POA: Diagnosis not present

## 2014-07-15 DIAGNOSIS — N2581 Secondary hyperparathyroidism of renal origin: Secondary | ICD-10-CM | POA: Diagnosis not present

## 2014-07-17 DIAGNOSIS — N033 Chronic nephritic syndrome with diffuse mesangial proliferative glomerulonephritis: Secondary | ICD-10-CM | POA: Diagnosis not present

## 2014-07-17 DIAGNOSIS — N2581 Secondary hyperparathyroidism of renal origin: Secondary | ICD-10-CM | POA: Diagnosis not present

## 2014-07-17 DIAGNOSIS — D631 Anemia in chronic kidney disease: Secondary | ICD-10-CM | POA: Diagnosis not present

## 2014-07-17 DIAGNOSIS — N186 End stage renal disease: Secondary | ICD-10-CM | POA: Diagnosis not present

## 2014-07-17 DIAGNOSIS — Z992 Dependence on renal dialysis: Secondary | ICD-10-CM | POA: Diagnosis not present

## 2014-07-17 DIAGNOSIS — D509 Iron deficiency anemia, unspecified: Secondary | ICD-10-CM | POA: Diagnosis not present

## 2014-07-19 DIAGNOSIS — N186 End stage renal disease: Secondary | ICD-10-CM | POA: Diagnosis not present

## 2014-07-19 DIAGNOSIS — D631 Anemia in chronic kidney disease: Secondary | ICD-10-CM | POA: Diagnosis not present

## 2014-07-19 DIAGNOSIS — D509 Iron deficiency anemia, unspecified: Secondary | ICD-10-CM | POA: Diagnosis not present

## 2014-07-19 DIAGNOSIS — N2581 Secondary hyperparathyroidism of renal origin: Secondary | ICD-10-CM | POA: Diagnosis not present

## 2014-07-22 DIAGNOSIS — D631 Anemia in chronic kidney disease: Secondary | ICD-10-CM | POA: Diagnosis not present

## 2014-07-22 DIAGNOSIS — D509 Iron deficiency anemia, unspecified: Secondary | ICD-10-CM | POA: Diagnosis not present

## 2014-07-22 DIAGNOSIS — N2581 Secondary hyperparathyroidism of renal origin: Secondary | ICD-10-CM | POA: Diagnosis not present

## 2014-07-22 DIAGNOSIS — N186 End stage renal disease: Secondary | ICD-10-CM | POA: Diagnosis not present

## 2014-07-24 DIAGNOSIS — D631 Anemia in chronic kidney disease: Secondary | ICD-10-CM | POA: Diagnosis not present

## 2014-07-24 DIAGNOSIS — D509 Iron deficiency anemia, unspecified: Secondary | ICD-10-CM | POA: Diagnosis not present

## 2014-07-24 DIAGNOSIS — N186 End stage renal disease: Secondary | ICD-10-CM | POA: Diagnosis not present

## 2014-07-24 DIAGNOSIS — N2581 Secondary hyperparathyroidism of renal origin: Secondary | ICD-10-CM | POA: Diagnosis not present

## 2014-07-26 DIAGNOSIS — N2581 Secondary hyperparathyroidism of renal origin: Secondary | ICD-10-CM | POA: Diagnosis not present

## 2014-07-26 DIAGNOSIS — D631 Anemia in chronic kidney disease: Secondary | ICD-10-CM | POA: Diagnosis not present

## 2014-07-26 DIAGNOSIS — D509 Iron deficiency anemia, unspecified: Secondary | ICD-10-CM | POA: Diagnosis not present

## 2014-07-26 DIAGNOSIS — N186 End stage renal disease: Secondary | ICD-10-CM | POA: Diagnosis not present

## 2014-07-29 DIAGNOSIS — D509 Iron deficiency anemia, unspecified: Secondary | ICD-10-CM | POA: Diagnosis not present

## 2014-07-29 DIAGNOSIS — N2581 Secondary hyperparathyroidism of renal origin: Secondary | ICD-10-CM | POA: Diagnosis not present

## 2014-07-29 DIAGNOSIS — D631 Anemia in chronic kidney disease: Secondary | ICD-10-CM | POA: Diagnosis not present

## 2014-07-29 DIAGNOSIS — N186 End stage renal disease: Secondary | ICD-10-CM | POA: Diagnosis not present

## 2014-07-31 DIAGNOSIS — D631 Anemia in chronic kidney disease: Secondary | ICD-10-CM | POA: Diagnosis not present

## 2014-07-31 DIAGNOSIS — D509 Iron deficiency anemia, unspecified: Secondary | ICD-10-CM | POA: Diagnosis not present

## 2014-07-31 DIAGNOSIS — N186 End stage renal disease: Secondary | ICD-10-CM | POA: Diagnosis not present

## 2014-07-31 DIAGNOSIS — N2581 Secondary hyperparathyroidism of renal origin: Secondary | ICD-10-CM | POA: Diagnosis not present

## 2014-08-02 DIAGNOSIS — N186 End stage renal disease: Secondary | ICD-10-CM | POA: Diagnosis not present

## 2014-08-02 DIAGNOSIS — N2581 Secondary hyperparathyroidism of renal origin: Secondary | ICD-10-CM | POA: Diagnosis not present

## 2014-08-02 DIAGNOSIS — D509 Iron deficiency anemia, unspecified: Secondary | ICD-10-CM | POA: Diagnosis not present

## 2014-08-02 DIAGNOSIS — D631 Anemia in chronic kidney disease: Secondary | ICD-10-CM | POA: Diagnosis not present

## 2014-08-05 DIAGNOSIS — N2581 Secondary hyperparathyroidism of renal origin: Secondary | ICD-10-CM | POA: Diagnosis not present

## 2014-08-05 DIAGNOSIS — N186 End stage renal disease: Secondary | ICD-10-CM | POA: Diagnosis not present

## 2014-08-05 DIAGNOSIS — D631 Anemia in chronic kidney disease: Secondary | ICD-10-CM | POA: Diagnosis not present

## 2014-08-05 DIAGNOSIS — D509 Iron deficiency anemia, unspecified: Secondary | ICD-10-CM | POA: Diagnosis not present

## 2014-08-07 DIAGNOSIS — N186 End stage renal disease: Secondary | ICD-10-CM | POA: Diagnosis not present

## 2014-08-07 DIAGNOSIS — D509 Iron deficiency anemia, unspecified: Secondary | ICD-10-CM | POA: Diagnosis not present

## 2014-08-07 DIAGNOSIS — D631 Anemia in chronic kidney disease: Secondary | ICD-10-CM | POA: Diagnosis not present

## 2014-08-07 DIAGNOSIS — N2581 Secondary hyperparathyroidism of renal origin: Secondary | ICD-10-CM | POA: Diagnosis not present

## 2014-08-09 DIAGNOSIS — D509 Iron deficiency anemia, unspecified: Secondary | ICD-10-CM | POA: Diagnosis not present

## 2014-08-09 DIAGNOSIS — N186 End stage renal disease: Secondary | ICD-10-CM | POA: Diagnosis not present

## 2014-08-09 DIAGNOSIS — D631 Anemia in chronic kidney disease: Secondary | ICD-10-CM | POA: Diagnosis not present

## 2014-08-09 DIAGNOSIS — N2581 Secondary hyperparathyroidism of renal origin: Secondary | ICD-10-CM | POA: Diagnosis not present

## 2014-08-12 DIAGNOSIS — N186 End stage renal disease: Secondary | ICD-10-CM | POA: Diagnosis not present

## 2014-08-12 DIAGNOSIS — N2581 Secondary hyperparathyroidism of renal origin: Secondary | ICD-10-CM | POA: Diagnosis not present

## 2014-08-12 DIAGNOSIS — D509 Iron deficiency anemia, unspecified: Secondary | ICD-10-CM | POA: Diagnosis not present

## 2014-08-12 DIAGNOSIS — D631 Anemia in chronic kidney disease: Secondary | ICD-10-CM | POA: Diagnosis not present

## 2014-08-14 DIAGNOSIS — N2581 Secondary hyperparathyroidism of renal origin: Secondary | ICD-10-CM | POA: Diagnosis not present

## 2014-08-14 DIAGNOSIS — N186 End stage renal disease: Secondary | ICD-10-CM | POA: Diagnosis not present

## 2014-08-14 DIAGNOSIS — D509 Iron deficiency anemia, unspecified: Secondary | ICD-10-CM | POA: Diagnosis not present

## 2014-08-14 DIAGNOSIS — D631 Anemia in chronic kidney disease: Secondary | ICD-10-CM | POA: Diagnosis not present

## 2014-08-16 DIAGNOSIS — N186 End stage renal disease: Secondary | ICD-10-CM | POA: Diagnosis not present

## 2014-08-16 DIAGNOSIS — N2581 Secondary hyperparathyroidism of renal origin: Secondary | ICD-10-CM | POA: Diagnosis not present

## 2014-08-16 DIAGNOSIS — D509 Iron deficiency anemia, unspecified: Secondary | ICD-10-CM | POA: Diagnosis not present

## 2014-08-16 DIAGNOSIS — D631 Anemia in chronic kidney disease: Secondary | ICD-10-CM | POA: Diagnosis not present

## 2014-08-17 DIAGNOSIS — Z992 Dependence on renal dialysis: Secondary | ICD-10-CM | POA: Diagnosis not present

## 2014-08-17 DIAGNOSIS — N186 End stage renal disease: Secondary | ICD-10-CM | POA: Diagnosis not present

## 2014-08-17 DIAGNOSIS — N033 Chronic nephritic syndrome with diffuse mesangial proliferative glomerulonephritis: Secondary | ICD-10-CM | POA: Diagnosis not present

## 2014-08-18 DIAGNOSIS — Z452 Encounter for adjustment and management of vascular access device: Secondary | ICD-10-CM | POA: Diagnosis not present

## 2014-08-19 DIAGNOSIS — N2581 Secondary hyperparathyroidism of renal origin: Secondary | ICD-10-CM | POA: Diagnosis not present

## 2014-08-19 DIAGNOSIS — D509 Iron deficiency anemia, unspecified: Secondary | ICD-10-CM | POA: Diagnosis not present

## 2014-08-19 DIAGNOSIS — N186 End stage renal disease: Secondary | ICD-10-CM | POA: Diagnosis not present

## 2014-08-19 DIAGNOSIS — D631 Anemia in chronic kidney disease: Secondary | ICD-10-CM | POA: Diagnosis not present

## 2014-08-19 DIAGNOSIS — N250 Renal osteodystrophy: Secondary | ICD-10-CM | POA: Diagnosis not present

## 2014-08-21 DIAGNOSIS — N250 Renal osteodystrophy: Secondary | ICD-10-CM | POA: Diagnosis not present

## 2014-08-21 DIAGNOSIS — D631 Anemia in chronic kidney disease: Secondary | ICD-10-CM | POA: Diagnosis not present

## 2014-08-21 DIAGNOSIS — N186 End stage renal disease: Secondary | ICD-10-CM | POA: Diagnosis not present

## 2014-08-21 DIAGNOSIS — D509 Iron deficiency anemia, unspecified: Secondary | ICD-10-CM | POA: Diagnosis not present

## 2014-08-21 DIAGNOSIS — N2581 Secondary hyperparathyroidism of renal origin: Secondary | ICD-10-CM | POA: Diagnosis not present

## 2014-08-23 DIAGNOSIS — N2581 Secondary hyperparathyroidism of renal origin: Secondary | ICD-10-CM | POA: Diagnosis not present

## 2014-08-23 DIAGNOSIS — D509 Iron deficiency anemia, unspecified: Secondary | ICD-10-CM | POA: Diagnosis not present

## 2014-08-23 DIAGNOSIS — N186 End stage renal disease: Secondary | ICD-10-CM | POA: Diagnosis not present

## 2014-08-23 DIAGNOSIS — D631 Anemia in chronic kidney disease: Secondary | ICD-10-CM | POA: Diagnosis not present

## 2014-08-23 DIAGNOSIS — N250 Renal osteodystrophy: Secondary | ICD-10-CM | POA: Diagnosis not present

## 2014-08-26 DIAGNOSIS — D509 Iron deficiency anemia, unspecified: Secondary | ICD-10-CM | POA: Diagnosis not present

## 2014-08-26 DIAGNOSIS — N250 Renal osteodystrophy: Secondary | ICD-10-CM | POA: Diagnosis not present

## 2014-08-26 DIAGNOSIS — N186 End stage renal disease: Secondary | ICD-10-CM | POA: Diagnosis not present

## 2014-08-26 DIAGNOSIS — N2581 Secondary hyperparathyroidism of renal origin: Secondary | ICD-10-CM | POA: Diagnosis not present

## 2014-08-26 DIAGNOSIS — D631 Anemia in chronic kidney disease: Secondary | ICD-10-CM | POA: Diagnosis not present

## 2014-08-28 DIAGNOSIS — N250 Renal osteodystrophy: Secondary | ICD-10-CM | POA: Diagnosis not present

## 2014-08-28 DIAGNOSIS — D631 Anemia in chronic kidney disease: Secondary | ICD-10-CM | POA: Diagnosis not present

## 2014-08-28 DIAGNOSIS — N186 End stage renal disease: Secondary | ICD-10-CM | POA: Diagnosis not present

## 2014-08-28 DIAGNOSIS — E042 Nontoxic multinodular goiter: Secondary | ICD-10-CM | POA: Diagnosis not present

## 2014-08-28 DIAGNOSIS — D509 Iron deficiency anemia, unspecified: Secondary | ICD-10-CM | POA: Diagnosis not present

## 2014-08-28 DIAGNOSIS — N2581 Secondary hyperparathyroidism of renal origin: Secondary | ICD-10-CM | POA: Diagnosis not present

## 2014-08-30 DIAGNOSIS — D631 Anemia in chronic kidney disease: Secondary | ICD-10-CM | POA: Diagnosis not present

## 2014-08-30 DIAGNOSIS — N250 Renal osteodystrophy: Secondary | ICD-10-CM | POA: Diagnosis not present

## 2014-08-30 DIAGNOSIS — N186 End stage renal disease: Secondary | ICD-10-CM | POA: Diagnosis not present

## 2014-08-30 DIAGNOSIS — D509 Iron deficiency anemia, unspecified: Secondary | ICD-10-CM | POA: Diagnosis not present

## 2014-08-30 DIAGNOSIS — N2581 Secondary hyperparathyroidism of renal origin: Secondary | ICD-10-CM | POA: Diagnosis not present

## 2014-09-02 DIAGNOSIS — N186 End stage renal disease: Secondary | ICD-10-CM | POA: Diagnosis not present

## 2014-09-02 DIAGNOSIS — D509 Iron deficiency anemia, unspecified: Secondary | ICD-10-CM | POA: Diagnosis not present

## 2014-09-02 DIAGNOSIS — N2581 Secondary hyperparathyroidism of renal origin: Secondary | ICD-10-CM | POA: Diagnosis not present

## 2014-09-02 DIAGNOSIS — N250 Renal osteodystrophy: Secondary | ICD-10-CM | POA: Diagnosis not present

## 2014-09-02 DIAGNOSIS — D631 Anemia in chronic kidney disease: Secondary | ICD-10-CM | POA: Diagnosis not present

## 2014-09-04 DIAGNOSIS — N186 End stage renal disease: Secondary | ICD-10-CM | POA: Diagnosis not present

## 2014-09-04 DIAGNOSIS — D509 Iron deficiency anemia, unspecified: Secondary | ICD-10-CM | POA: Diagnosis not present

## 2014-09-04 DIAGNOSIS — D631 Anemia in chronic kidney disease: Secondary | ICD-10-CM | POA: Diagnosis not present

## 2014-09-04 DIAGNOSIS — N2581 Secondary hyperparathyroidism of renal origin: Secondary | ICD-10-CM | POA: Diagnosis not present

## 2014-09-04 DIAGNOSIS — N250 Renal osteodystrophy: Secondary | ICD-10-CM | POA: Diagnosis not present

## 2014-09-06 DIAGNOSIS — D509 Iron deficiency anemia, unspecified: Secondary | ICD-10-CM | POA: Diagnosis not present

## 2014-09-06 DIAGNOSIS — N2581 Secondary hyperparathyroidism of renal origin: Secondary | ICD-10-CM | POA: Diagnosis not present

## 2014-09-06 DIAGNOSIS — D631 Anemia in chronic kidney disease: Secondary | ICD-10-CM | POA: Diagnosis not present

## 2014-09-06 DIAGNOSIS — N250 Renal osteodystrophy: Secondary | ICD-10-CM | POA: Diagnosis not present

## 2014-09-06 DIAGNOSIS — N186 End stage renal disease: Secondary | ICD-10-CM | POA: Diagnosis not present

## 2014-09-08 ENCOUNTER — Emergency Department (HOSPITAL_COMMUNITY)
Admission: EM | Admit: 2014-09-08 | Discharge: 2014-09-08 | Disposition: A | Discharge status: Home / Self Care | Payer: Medicare Other | Attending: Emergency Medicine | Admitting: Emergency Medicine

## 2014-09-08 ENCOUNTER — Encounter (HOSPITAL_COMMUNITY): Payer: Self-pay | Admitting: Emergency Medicine

## 2014-09-08 DIAGNOSIS — Z880 Allergy status to penicillin: Secondary | ICD-10-CM | POA: Insufficient documentation

## 2014-09-08 DIAGNOSIS — Z992 Dependence on renal dialysis: Secondary | ICD-10-CM

## 2014-09-08 DIAGNOSIS — R112 Nausea with vomiting, unspecified: Secondary | ICD-10-CM | POA: Diagnosis not present

## 2014-09-08 DIAGNOSIS — K861 Other chronic pancreatitis: Secondary | ICD-10-CM | POA: Insufficient documentation

## 2014-09-08 DIAGNOSIS — Z720 Tobacco use: Secondary | ICD-10-CM | POA: Insufficient documentation

## 2014-09-08 DIAGNOSIS — F419 Anxiety disorder, unspecified: Secondary | ICD-10-CM | POA: Diagnosis not present

## 2014-09-08 DIAGNOSIS — I120 Hypertensive chronic kidney disease with stage 5 chronic kidney disease or end stage renal disease: Secondary | ICD-10-CM | POA: Diagnosis not present

## 2014-09-08 DIAGNOSIS — Z862 Personal history of diseases of the blood and blood-forming organs and certain disorders involving the immune mechanism: Secondary | ICD-10-CM | POA: Diagnosis not present

## 2014-09-08 DIAGNOSIS — Z79899 Other long term (current) drug therapy: Secondary | ICD-10-CM | POA: Diagnosis not present

## 2014-09-08 DIAGNOSIS — I150 Renovascular hypertension: Secondary | ICD-10-CM

## 2014-09-08 DIAGNOSIS — R1013 Epigastric pain: Secondary | ICD-10-CM | POA: Diagnosis present

## 2014-09-08 DIAGNOSIS — N186 End stage renal disease: Secondary | ICD-10-CM

## 2014-09-08 LAB — CBC WITH DIFFERENTIAL/PLATELET
Basophils Absolute: 0.1 10*3/uL (ref 0.0–0.1)
Basophils Relative: 1 % (ref 0–1)
Eosinophils Absolute: 0.5 10*3/uL (ref 0.0–0.7)
Eosinophils Relative: 6 % — ABNORMAL HIGH (ref 0–5)
HEMATOCRIT: 38.1 % (ref 36.0–46.0)
HEMOGLOBIN: 12.8 g/dL (ref 12.0–15.0)
LYMPHS ABS: 1.5 10*3/uL (ref 0.7–4.0)
LYMPHS PCT: 16 % (ref 12–46)
MCH: 30.3 pg (ref 26.0–34.0)
MCHC: 33.6 g/dL (ref 30.0–36.0)
MCV: 90.3 fL (ref 78.0–100.0)
Monocytes Absolute: 0.4 10*3/uL (ref 0.1–1.0)
Monocytes Relative: 4 % (ref 3–12)
NEUTROS PCT: 73 % (ref 43–77)
Neutro Abs: 6.5 10*3/uL (ref 1.7–7.7)
Platelets: 183 10*3/uL (ref 150–400)
RBC: 4.22 MIL/uL (ref 3.87–5.11)
RDW: 13.8 % (ref 11.5–15.5)
WBC: 8.9 10*3/uL (ref 4.0–10.5)

## 2014-09-08 LAB — COMPREHENSIVE METABOLIC PANEL
ALT: 8 U/L — ABNORMAL LOW (ref 14–54)
AST: 18 U/L (ref 15–41)
Albumin: 3.6 g/dL (ref 3.5–5.0)
Alkaline Phosphatase: 87 U/L (ref 38–126)
Anion gap: 15 (ref 5–15)
BUN: 33 mg/dL — ABNORMAL HIGH (ref 6–20)
CHLORIDE: 96 mmol/L — AB (ref 101–111)
CO2: 23 mmol/L (ref 22–32)
Calcium: 8.8 mg/dL — ABNORMAL LOW (ref 8.9–10.3)
Creatinine, Ser: 9.57 mg/dL — ABNORMAL HIGH (ref 0.44–1.00)
GFR, EST AFRICAN AMERICAN: 5 mL/min — AB (ref >60)
GFR, EST NON AFRICAN AMERICAN: 4 mL/min — AB (ref >60)
Glucose, Bld: 84 mg/dL (ref 65–99)
POTASSIUM: 3.8 mmol/L (ref 3.5–5.1)
SODIUM: 134 mmol/L — AB (ref 135–145)
Total Bilirubin: 0.6 mg/dL (ref 0.3–1.2)
Total Protein: 7.3 g/dL (ref 6.5–8.1)

## 2014-09-08 LAB — LIPASE, BLOOD: LIPASE: 42 U/L (ref 22–51)

## 2014-09-08 MED ORDER — HYDROMORPHONE HCL 1 MG/ML IJ SOLN
0.5000 mg | Freq: Once | INTRAMUSCULAR | Status: AC
  Administered 2014-09-08: 0.5 mg via INTRAVENOUS
  Filled 2014-09-08: qty 1

## 2014-09-08 MED ORDER — PROMETHAZINE HCL 25 MG/ML IJ SOLN
25.0000 mg | Freq: Once | INTRAMUSCULAR | Status: AC
  Administered 2014-09-08: 25 mg via INTRAVENOUS
  Filled 2014-09-08 (×2): qty 1

## 2014-09-08 MED ORDER — ONDANSETRON 4 MG PO TBDP: 8.0000 mg | ORAL_TABLET | Freq: Once | ORAL | Status: AC

## 2014-09-08 MED ORDER — METOPROLOL TARTRATE 1 MG/ML IV SOLN
5.0000 mg | Freq: Once | INTRAVENOUS | Status: AC
  Administered 2014-09-08: 5 mg via INTRAVENOUS
  Filled 2014-09-08: qty 5

## 2014-09-08 MED ORDER — HYDROMORPHONE HCL 1 MG/ML IJ SOLN
1.0000 mg | Freq: Once | INTRAMUSCULAR | Status: AC
Start: 2014-09-08 — End: 2014-09-08
  Administered 2014-09-08: 1 mg via INTRAVENOUS
  Filled 2014-09-08: qty 1

## 2014-09-08 MED ORDER — ONDANSETRON 4 MG PO TBDP
ORAL_TABLET | ORAL | Status: AC
Start: 2014-09-08 — End: 2014-09-08
  Administered 2014-09-08: 08:00:00
  Filled 2014-09-08: qty 2

## 2014-09-08 MED ORDER — PROMETHAZINE HCL 25 MG RE SUPP: 25.0000 mg | Freq: Four times a day (QID) | RECTAL | Status: DC | PRN

## 2014-09-08 MED ORDER — PROMETHAZINE HCL 25 MG PO TABS: 25.0000 mg | ORAL_TABLET | Freq: Four times a day (QID) | ORAL | Status: DC | PRN

## 2014-09-08 NOTE — ED Notes (Signed)
Pt ambulatory with steady gait on departure. Verbalizes understanding of discharge instructions. NAD. VSS. A/O x4.

## 2014-09-08 NOTE — ED Notes (Signed)
Pt reports she has not been able to take blood pressure medicine today or yesterday due to nausea. BP 216/100, pt reports "it runs real high." Denies headache, blurred vision, or any neuro symptoms.

## 2014-09-08 NOTE — Discharge Instructions (Signed)
1. Medications: zofran, vicodin, usual home medications 2. Treatment: rest, drink plenty of fluids, advance diet slowly 3. Follow Up: Please followup with your primary doctor in 2 days for discussion of your diagnoses and further evaluation after today's visit; if you do not have a primary care doctor use the resource guide provided to find one; Please return to the ER for persistent vomiting, high fevers or worsening symptoms  

## 2014-09-08 NOTE — ED Provider Notes (Signed)
CSN: [id number]     Arrival date & time 09/08/14  0715 History   First MD Initiated Contact with Patient 09/08/14 [phone]     Chief Complaint  Patient presents with  . Pancreatitis     (Consider location/radiation/quality/duration/timing/severity/associated sxs/prior Treatment) The history is provided by the patient and medical records. No language interpreter was used.    [name] is a 54 y.o. female  with a hx of hypertension, chronic pancreatitis, anxiety, anemia, ESRD on dialysis (Tuesday Thursday Saturday), secondary hyperparathyroidism and glomerulonephritis presents to the Emergency Department complaining of gradual, persistent, progressively worsening epigastric abdominal pain with associated nausea and vomiting onset yesterday morning. Patient reports this feels the same as her previous episodes of pancreatitis. Patient reports emesis is nonbloody and nonbilious. No abdominal surgeries aside from her kidney transplant in 1997. No history of cholecystectomy. Patient denies history of gallstones. She denies fever, chills, headache neck pain, chest pain, shortness of breath, diarrhea, weakness, dizziness, syncope. Patient does not make urine. She reports that she has been nothing by mouth since the pain began yesterday. Nothing seems to make the symptoms better or worse.    Past Medical History  Diagnosis Date  . HTN (hypertension)   . Pancreatitis   . Anxiety   . Anemia   . Claustrophobia   . ESRD (end stage renal disease) on dialysis     "TTS; [facility]" (05/12/2014)  . Glomerulonephritis   . Secondary hyperparathyroidism     /notes 05/11/2014   Past Surgical History  Procedure Laterality Date  . Kidney transplant  1997  . Arteriovenous graft placement Right 1990's?  . Dilatation & currettage/hysteroscopy with resectocope N/A 04/17/2013    Procedure: DILATATION & CURETTAGE/HYSTEROSCOPY WITH RESECTOCOPE;  Surgeon: Marvene Staff, MD;  Location: [facility];   Service: Gynecology;  Laterality: N/A;  . Colonoscopy    . Peritoneal catheter insertion    . Parathyroidectomy  03/2000 05/12/2014    w/neck exploration & autotransplantation/notes 06/02/2010; w/neck exploration  . Dilation and curettage of uterus    . Peritoneal catheter removal  08/1999    [name] 06/02/2010  . Breast biopsy Left   . Thrombectomy and revision of arterioventous (av) goretex  graft Right 01/2002; 11/2003; 01/2004; 08/07/2004; 08/10/2004; 11/13/2005    [name] 5/1/2012Marland Kitchen [name] 5/16/2012Marland Kitchen [name] 5/16/2012Marland Kitchen [name] 5/16/2012Marland Kitchen [name] 06/02/2010; [name] 06/02/2010  . Av fistula placement Left 09/2000    upper arm/notes 06/02/2010  . Thrombectomy Left 02/2002    fistula/notes 06/02/2010  . Arteriovenous graft placement Left 07/2002    upper arm/notes 06/02/2010  . Thrombectomy and revision of arterioventous (av) goretex  graft Left 08/23/2002; 09/13/2002; 07/08/2003    [name] 5/16/2012Marland Kitchen [name] 5/16/2012Marland Kitchen [name] 06/02/2010  . Parathyroidectomy N/A 05/12/2014    Procedure: PARATHYROIDECTOMY AND NECK EXPLORATION;  Surgeon: Armandina Gemma, MD;  Location: [facility];  Service: General;  Laterality: N/A;   Family History  Problem Relation Age of Onset  . Adopted: Yes  . Diabetes Maternal Aunt     x2  . Diabetes Maternal Uncle    Social History  Substance Use Topics  . Smoking status: Current Every Day Smoker -- 0.25 packs/day for 20 years    Types: Cigarettes  . Smokeless tobacco: Never Used  . Alcohol Use: No   OB History    No data available     Review of Systems  Constitutional: Negative for fever, diaphoresis, appetite change, fatigue and unexpected weight change.  HENT: Negative for mouth sores.   Eyes: Negative for  visual disturbance.  Respiratory: Negative for cough, chest tightness, shortness of breath and wheezing.   Cardiovascular: Negative for chest pain.  Gastrointestinal: Positive for nausea, vomiting and abdominal pain. Negative for diarrhea and constipation.  Endocrine: Negative for  polydipsia, polyphagia and polyuria.  Genitourinary: Negative for dysuria, urgency, frequency and hematuria.  Musculoskeletal: Negative for back pain and neck stiffness.  Skin: Negative for rash.  Allergic/Immunologic: Negative for immunocompromised state.  Neurological: Negative for syncope, light-headedness and headaches.  Hematological: Does not bruise/bleed easily.  Psychiatric/Behavioral: Negative for sleep disturbance. The patient is not nervous/anxious.       Allergies  Ace inhibitors; Codeine; and Penicillins  Home Medications   Prior to Admission medications   Medication Sig Start Date End Date Taking? Authorizing Provider  ALPRAZolam Duanne Moron) 0.25 MG tablet Take 0.25 mg by mouth at bedtime as needed for anxiety.   Yes Historical Provider, MD  calcium carbonate (TUMS - DOSED IN MG ELEMENTAL CALCIUM) 500 MG chewable tablet Chew 2 tablets by mouth 3 (three) times daily with meals.   Yes Historical Provider, MD  levothyroxine (SYNTHROID, LEVOTHROID) 50 MCG tablet Take 50 mcg by mouth daily. 07/23/14  Yes Historical Provider, MD  metoprolol tartrate (LOPRESSOR) 25 MG tablet Take 25 mg by mouth daily.    Yes Historical Provider, MD  HYDROcodone-acetaminophen (NORCO/VICODIN) 5-325 MG per tablet Take 1-2 tablets by mouth every 4 (four) hours as needed for moderate pain. Patient not taking: Reported on 09/08/2014 05/13/14   Armandina Gemma, MD  promethazine (PHENERGAN) 25 MG suppository Place 1 suppository (25 mg total) rectally every 6 (six) hours as needed for nausea or vomiting. 09/08/14   Jarrett Soho Mitesh Rosendahl, PA-C  promethazine (PHENERGAN) 25 MG tablet Take 1 tablet (25 mg total) by mouth every 6 (six) hours as needed for nausea or vomiting. 09/08/14   Jarrett Soho Rashunda Passon, PA-C   BP 166/84 mmHg  Pulse 55  Temp(Src) 98 F (36.7 C) (Oral)  Resp 19  SpO2 97% Physical Exam  Constitutional: She appears well-developed and well-nourished. No distress.  Awake, alert, nontoxic appearance  HENT:   Head: Normocephalic and atraumatic.  Mouth/Throat: Oropharynx is clear and moist. No oropharyngeal exudate.  Eyes: Conjunctivae are normal. No scleral icterus.  Neck: Normal range of motion. Neck supple.  Cardiovascular: Normal rate, regular rhythm, normal heart sounds and intact distal pulses.   Pulmonary/Chest: Effort normal and breath sounds normal. No respiratory distress. She has no wheezes.  Equal chest expansion  Abdominal: Soft. Bowel sounds are normal. She exhibits no distension and no mass. There is tenderness in the epigastric area. There is guarding. There is no rebound.  Epigastric tenderness with mild guarding Abdomen is soft, no rebound tenderness  Musculoskeletal: Normal range of motion. She exhibits no edema.  Neurological: She is alert.  Speech is clear and goal oriented Moves extremities without ataxia  Skin: Skin is warm and dry. She is not diaphoretic.  Psychiatric: She has a normal mood and affect.  Nursing note and vitals reviewed.   ED Course  Procedures (including critical care time) Labs Review Labs Reviewed  COMPREHENSIVE METABOLIC PANEL - Abnormal; Notable for the following:    Sodium 134 (*)    Chloride 96 (*)    BUN 33 (*)    Creatinine, Ser 9.57 (*)    Calcium 8.8 (*)    ALT 8 (*)    GFR calc non Af Amer 4 (*)    GFR calc Af Amer 5 (*)    All other components within normal  limits  CBC WITH DIFFERENTIAL/PLATELET - Abnormal; Notable for the following:    Eosinophils Relative 6 (*)    All other components within normal limits  LIPASE, BLOOD    Imaging Review No results found. I have personally reviewed and evaluated these images and lab results as part of my medical decision-making.   EKG Interpretation None      MDM   Final diagnoses:  Chronic pancreatitis, unspecified pancreatitis type  Renovascular hypertension  Non-intractable vomiting with nausea, vomiting of unspecified type  ESRD on dialysis   Jeylin L Comella presents with hx of  chronic pancreatitis with c/o an acute flare.  Pt with N/V and epigastric abd pain.  Abd is tender but soft.  Pt given pain control and antiemetic.  Pt unable to take HTN medications since yesterday and is hypertensive here in the ED.  Will give IV metoprolol.  Labs pending  10:06 AM Labs reassuring.  Patient continues to have pain 5/10. Will give additional pain control and reassess.  Patient with resolving hypertension after metoprolol was given.  She remains without chest pain or shortness of breath.  12:19 PM Patient continues to feel better. Her pain is under control and she has not had a return of her nausea. She has tolerated ice chips without emesis. We will attempt by mouth trial with fluids.  1:30 PM Patient has tolerated fluids without emesis here in the emergency department. She does report increase in pain with by mouth trial. I have offered admission for pain control however she wishes for discharge home with an attempt to treat pain and nausea at home. She reports she does have pain medicine at home but does not have any nausea medication. She is well-appearing and her vital signs are stable at this time.  Patient reports she will attend dialysis tomorrow as scheduled or return here to the emergency department if symptoms worsen.  BP 166/84 mmHg  Pulse 55  Temp(Src) 98 F (36.7 C) (Oral)  Resp 19  SpO2 97%     Abigail Butts, PA-C 09/08/14 [place], MD 09/12/14 [phone]

## 2014-09-08 NOTE — ED Notes (Addendum)
Pt states hx of pancreatitis; dialysis pt. Went Saturday and got full session. States diarr; nausea; epigastric pain typical of what she feels when has pancreatitis. States it has been 2-3 years since last flair up. States has had it 5-10 times in past.

## 2014-09-09 DIAGNOSIS — N250 Renal osteodystrophy: Secondary | ICD-10-CM | POA: Diagnosis not present

## 2014-09-09 DIAGNOSIS — D509 Iron deficiency anemia, unspecified: Secondary | ICD-10-CM | POA: Diagnosis not present

## 2014-09-09 DIAGNOSIS — N186 End stage renal disease: Secondary | ICD-10-CM | POA: Diagnosis not present

## 2014-09-09 DIAGNOSIS — N2581 Secondary hyperparathyroidism of renal origin: Secondary | ICD-10-CM | POA: Diagnosis not present

## 2014-09-09 DIAGNOSIS — D631 Anemia in chronic kidney disease: Secondary | ICD-10-CM | POA: Diagnosis not present

## 2014-09-11 DIAGNOSIS — D631 Anemia in chronic kidney disease: Secondary | ICD-10-CM | POA: Diagnosis not present

## 2014-09-11 DIAGNOSIS — N186 End stage renal disease: Secondary | ICD-10-CM | POA: Diagnosis not present

## 2014-09-11 DIAGNOSIS — D509 Iron deficiency anemia, unspecified: Secondary | ICD-10-CM | POA: Diagnosis not present

## 2014-09-11 DIAGNOSIS — N250 Renal osteodystrophy: Secondary | ICD-10-CM | POA: Diagnosis not present

## 2014-09-11 DIAGNOSIS — N2581 Secondary hyperparathyroidism of renal origin: Secondary | ICD-10-CM | POA: Diagnosis not present

## 2014-09-13 DIAGNOSIS — N2581 Secondary hyperparathyroidism of renal origin: Secondary | ICD-10-CM | POA: Diagnosis not present

## 2014-09-13 DIAGNOSIS — D509 Iron deficiency anemia, unspecified: Secondary | ICD-10-CM | POA: Diagnosis not present

## 2014-09-13 DIAGNOSIS — N186 End stage renal disease: Secondary | ICD-10-CM | POA: Diagnosis not present

## 2014-09-13 DIAGNOSIS — D631 Anemia in chronic kidney disease: Secondary | ICD-10-CM | POA: Diagnosis not present

## 2014-09-13 DIAGNOSIS — N250 Renal osteodystrophy: Secondary | ICD-10-CM | POA: Diagnosis not present

## 2014-09-16 DIAGNOSIS — D509 Iron deficiency anemia, unspecified: Secondary | ICD-10-CM | POA: Diagnosis not present

## 2014-09-16 DIAGNOSIS — D631 Anemia in chronic kidney disease: Secondary | ICD-10-CM | POA: Diagnosis not present

## 2014-09-16 DIAGNOSIS — N2581 Secondary hyperparathyroidism of renal origin: Secondary | ICD-10-CM | POA: Diagnosis not present

## 2014-09-16 DIAGNOSIS — N186 End stage renal disease: Secondary | ICD-10-CM | POA: Diagnosis not present

## 2014-09-16 DIAGNOSIS — N250 Renal osteodystrophy: Secondary | ICD-10-CM | POA: Diagnosis not present

## 2014-09-17 DIAGNOSIS — Z992 Dependence on renal dialysis: Secondary | ICD-10-CM | POA: Diagnosis not present

## 2014-09-17 DIAGNOSIS — N186 End stage renal disease: Secondary | ICD-10-CM | POA: Diagnosis not present

## 2014-09-17 DIAGNOSIS — N033 Chronic nephritic syndrome with diffuse mesangial proliferative glomerulonephritis: Secondary | ICD-10-CM | POA: Diagnosis not present

## 2014-09-18 DIAGNOSIS — D631 Anemia in chronic kidney disease: Secondary | ICD-10-CM | POA: Diagnosis not present

## 2014-09-18 DIAGNOSIS — N2581 Secondary hyperparathyroidism of renal origin: Secondary | ICD-10-CM | POA: Diagnosis not present

## 2014-09-18 DIAGNOSIS — N186 End stage renal disease: Secondary | ICD-10-CM | POA: Diagnosis not present

## 2014-09-20 DIAGNOSIS — N186 End stage renal disease: Secondary | ICD-10-CM | POA: Diagnosis not present

## 2014-09-20 DIAGNOSIS — D631 Anemia in chronic kidney disease: Secondary | ICD-10-CM | POA: Diagnosis not present

## 2014-09-20 DIAGNOSIS — N2581 Secondary hyperparathyroidism of renal origin: Secondary | ICD-10-CM | POA: Diagnosis not present

## 2014-09-23 DIAGNOSIS — N2581 Secondary hyperparathyroidism of renal origin: Secondary | ICD-10-CM | POA: Diagnosis not present

## 2014-09-23 DIAGNOSIS — N186 End stage renal disease: Secondary | ICD-10-CM | POA: Diagnosis not present

## 2014-09-23 DIAGNOSIS — D631 Anemia in chronic kidney disease: Secondary | ICD-10-CM | POA: Diagnosis not present

## 2014-09-25 DIAGNOSIS — D631 Anemia in chronic kidney disease: Secondary | ICD-10-CM | POA: Diagnosis not present

## 2014-09-25 DIAGNOSIS — N186 End stage renal disease: Secondary | ICD-10-CM | POA: Diagnosis not present

## 2014-09-25 DIAGNOSIS — N2581 Secondary hyperparathyroidism of renal origin: Secondary | ICD-10-CM | POA: Diagnosis not present

## 2014-09-27 DIAGNOSIS — D631 Anemia in chronic kidney disease: Secondary | ICD-10-CM | POA: Diagnosis not present

## 2014-09-27 DIAGNOSIS — N186 End stage renal disease: Secondary | ICD-10-CM | POA: Diagnosis not present

## 2014-09-27 DIAGNOSIS — N2581 Secondary hyperparathyroidism of renal origin: Secondary | ICD-10-CM | POA: Diagnosis not present

## 2014-09-30 DIAGNOSIS — N2581 Secondary hyperparathyroidism of renal origin: Secondary | ICD-10-CM | POA: Diagnosis not present

## 2014-09-30 DIAGNOSIS — N186 End stage renal disease: Secondary | ICD-10-CM | POA: Diagnosis not present

## 2014-09-30 DIAGNOSIS — D631 Anemia in chronic kidney disease: Secondary | ICD-10-CM | POA: Diagnosis not present

## 2014-10-02 DIAGNOSIS — D631 Anemia in chronic kidney disease: Secondary | ICD-10-CM | POA: Diagnosis not present

## 2014-10-02 DIAGNOSIS — N2581 Secondary hyperparathyroidism of renal origin: Secondary | ICD-10-CM | POA: Diagnosis not present

## 2014-10-02 DIAGNOSIS — N186 End stage renal disease: Secondary | ICD-10-CM | POA: Diagnosis not present

## 2014-10-04 DIAGNOSIS — N2581 Secondary hyperparathyroidism of renal origin: Secondary | ICD-10-CM | POA: Diagnosis not present

## 2014-10-04 DIAGNOSIS — N186 End stage renal disease: Secondary | ICD-10-CM | POA: Diagnosis not present

## 2014-10-04 DIAGNOSIS — D631 Anemia in chronic kidney disease: Secondary | ICD-10-CM | POA: Diagnosis not present

## 2014-10-07 DIAGNOSIS — N186 End stage renal disease: Secondary | ICD-10-CM | POA: Diagnosis not present

## 2014-10-07 DIAGNOSIS — D631 Anemia in chronic kidney disease: Secondary | ICD-10-CM | POA: Diagnosis not present

## 2014-10-07 DIAGNOSIS — N2581 Secondary hyperparathyroidism of renal origin: Secondary | ICD-10-CM | POA: Diagnosis not present

## 2014-10-09 DIAGNOSIS — N186 End stage renal disease: Secondary | ICD-10-CM | POA: Diagnosis not present

## 2014-10-09 DIAGNOSIS — N2581 Secondary hyperparathyroidism of renal origin: Secondary | ICD-10-CM | POA: Diagnosis not present

## 2014-10-09 DIAGNOSIS — D631 Anemia in chronic kidney disease: Secondary | ICD-10-CM | POA: Diagnosis not present

## 2014-10-11 DIAGNOSIS — D631 Anemia in chronic kidney disease: Secondary | ICD-10-CM | POA: Diagnosis not present

## 2014-10-11 DIAGNOSIS — N2581 Secondary hyperparathyroidism of renal origin: Secondary | ICD-10-CM | POA: Diagnosis not present

## 2014-10-11 DIAGNOSIS — N186 End stage renal disease: Secondary | ICD-10-CM | POA: Diagnosis not present

## 2014-10-13 DIAGNOSIS — Z01818 Encounter for other preprocedural examination: Secondary | ICD-10-CM | POA: Diagnosis not present

## 2014-10-14 DIAGNOSIS — D631 Anemia in chronic kidney disease: Secondary | ICD-10-CM | POA: Diagnosis not present

## 2014-10-14 DIAGNOSIS — N2581 Secondary hyperparathyroidism of renal origin: Secondary | ICD-10-CM | POA: Diagnosis not present

## 2014-10-14 DIAGNOSIS — N186 End stage renal disease: Secondary | ICD-10-CM | POA: Diagnosis not present

## 2014-10-16 DIAGNOSIS — D631 Anemia in chronic kidney disease: Secondary | ICD-10-CM | POA: Diagnosis not present

## 2014-10-16 DIAGNOSIS — N186 End stage renal disease: Secondary | ICD-10-CM | POA: Diagnosis not present

## 2014-10-16 DIAGNOSIS — N2581 Secondary hyperparathyroidism of renal origin: Secondary | ICD-10-CM | POA: Diagnosis not present

## 2014-10-17 DIAGNOSIS — N186 End stage renal disease: Secondary | ICD-10-CM | POA: Diagnosis not present

## 2014-10-17 DIAGNOSIS — N033 Chronic nephritic syndrome with diffuse mesangial proliferative glomerulonephritis: Secondary | ICD-10-CM | POA: Diagnosis not present

## 2014-10-17 DIAGNOSIS — Z992 Dependence on renal dialysis: Secondary | ICD-10-CM | POA: Diagnosis not present

## 2014-10-18 DIAGNOSIS — N186 End stage renal disease: Secondary | ICD-10-CM | POA: Diagnosis not present

## 2014-10-18 DIAGNOSIS — N2581 Secondary hyperparathyroidism of renal origin: Secondary | ICD-10-CM | POA: Diagnosis not present

## 2014-10-18 DIAGNOSIS — D631 Anemia in chronic kidney disease: Secondary | ICD-10-CM | POA: Diagnosis not present

## 2014-10-21 DIAGNOSIS — N2581 Secondary hyperparathyroidism of renal origin: Secondary | ICD-10-CM | POA: Diagnosis not present

## 2014-10-21 DIAGNOSIS — N186 End stage renal disease: Secondary | ICD-10-CM | POA: Diagnosis not present

## 2014-10-21 DIAGNOSIS — D631 Anemia in chronic kidney disease: Secondary | ICD-10-CM | POA: Diagnosis not present

## 2014-10-23 DIAGNOSIS — N186 End stage renal disease: Secondary | ICD-10-CM | POA: Diagnosis not present

## 2014-10-23 DIAGNOSIS — N2581 Secondary hyperparathyroidism of renal origin: Secondary | ICD-10-CM | POA: Diagnosis not present

## 2014-10-23 DIAGNOSIS — D631 Anemia in chronic kidney disease: Secondary | ICD-10-CM | POA: Diagnosis not present

## 2014-10-25 DIAGNOSIS — N186 End stage renal disease: Secondary | ICD-10-CM | POA: Diagnosis not present

## 2014-10-25 DIAGNOSIS — N2581 Secondary hyperparathyroidism of renal origin: Secondary | ICD-10-CM | POA: Diagnosis not present

## 2014-10-25 DIAGNOSIS — D631 Anemia in chronic kidney disease: Secondary | ICD-10-CM | POA: Diagnosis not present

## 2014-10-28 DIAGNOSIS — D631 Anemia in chronic kidney disease: Secondary | ICD-10-CM | POA: Diagnosis not present

## 2014-10-28 DIAGNOSIS — N2581 Secondary hyperparathyroidism of renal origin: Secondary | ICD-10-CM | POA: Diagnosis not present

## 2014-10-28 DIAGNOSIS — N186 End stage renal disease: Secondary | ICD-10-CM | POA: Diagnosis not present

## 2014-10-30 DIAGNOSIS — N2581 Secondary hyperparathyroidism of renal origin: Secondary | ICD-10-CM | POA: Diagnosis not present

## 2014-10-30 DIAGNOSIS — N186 End stage renal disease: Secondary | ICD-10-CM | POA: Diagnosis not present

## 2014-10-30 DIAGNOSIS — D631 Anemia in chronic kidney disease: Secondary | ICD-10-CM | POA: Diagnosis not present

## 2014-10-31 DIAGNOSIS — D631 Anemia in chronic kidney disease: Secondary | ICD-10-CM | POA: Diagnosis not present

## 2014-10-31 DIAGNOSIS — N186 End stage renal disease: Secondary | ICD-10-CM | POA: Diagnosis not present

## 2014-10-31 DIAGNOSIS — N2581 Secondary hyperparathyroidism of renal origin: Secondary | ICD-10-CM | POA: Diagnosis not present

## 2014-11-04 DIAGNOSIS — N186 End stage renal disease: Secondary | ICD-10-CM | POA: Diagnosis not present

## 2014-11-04 DIAGNOSIS — N2581 Secondary hyperparathyroidism of renal origin: Secondary | ICD-10-CM | POA: Diagnosis not present

## 2014-11-04 DIAGNOSIS — D631 Anemia in chronic kidney disease: Secondary | ICD-10-CM | POA: Diagnosis not present

## 2014-11-05 ENCOUNTER — Ambulatory Visit: Payer: Medicare Other | Admitting: Internal Medicine

## 2014-11-06 DIAGNOSIS — D631 Anemia in chronic kidney disease: Secondary | ICD-10-CM | POA: Diagnosis not present

## 2014-11-06 DIAGNOSIS — N186 End stage renal disease: Secondary | ICD-10-CM | POA: Diagnosis not present

## 2014-11-06 DIAGNOSIS — N2581 Secondary hyperparathyroidism of renal origin: Secondary | ICD-10-CM | POA: Diagnosis not present

## 2014-11-08 DIAGNOSIS — N2581 Secondary hyperparathyroidism of renal origin: Secondary | ICD-10-CM | POA: Diagnosis not present

## 2014-11-08 DIAGNOSIS — D631 Anemia in chronic kidney disease: Secondary | ICD-10-CM | POA: Diagnosis not present

## 2014-11-08 DIAGNOSIS — N186 End stage renal disease: Secondary | ICD-10-CM | POA: Diagnosis not present

## 2014-11-11 DIAGNOSIS — D631 Anemia in chronic kidney disease: Secondary | ICD-10-CM | POA: Diagnosis not present

## 2014-11-11 DIAGNOSIS — N2581 Secondary hyperparathyroidism of renal origin: Secondary | ICD-10-CM | POA: Diagnosis not present

## 2014-11-11 DIAGNOSIS — N186 End stage renal disease: Secondary | ICD-10-CM | POA: Diagnosis not present

## 2014-11-13 DIAGNOSIS — D631 Anemia in chronic kidney disease: Secondary | ICD-10-CM | POA: Diagnosis not present

## 2014-11-13 DIAGNOSIS — N186 End stage renal disease: Secondary | ICD-10-CM | POA: Diagnosis not present

## 2014-11-13 DIAGNOSIS — N2581 Secondary hyperparathyroidism of renal origin: Secondary | ICD-10-CM | POA: Diagnosis not present

## 2014-11-15 DIAGNOSIS — N186 End stage renal disease: Secondary | ICD-10-CM | POA: Diagnosis not present

## 2014-11-15 DIAGNOSIS — D631 Anemia in chronic kidney disease: Secondary | ICD-10-CM | POA: Diagnosis not present

## 2014-11-15 DIAGNOSIS — N2581 Secondary hyperparathyroidism of renal origin: Secondary | ICD-10-CM | POA: Diagnosis not present

## 2014-11-17 DIAGNOSIS — Z992 Dependence on renal dialysis: Secondary | ICD-10-CM | POA: Diagnosis not present

## 2014-11-17 DIAGNOSIS — N033 Chronic nephritic syndrome with diffuse mesangial proliferative glomerulonephritis: Secondary | ICD-10-CM | POA: Diagnosis not present

## 2014-11-17 DIAGNOSIS — N186 End stage renal disease: Secondary | ICD-10-CM | POA: Diagnosis not present

## 2014-11-18 DIAGNOSIS — D631 Anemia in chronic kidney disease: Secondary | ICD-10-CM | POA: Diagnosis not present

## 2014-11-18 DIAGNOSIS — N2581 Secondary hyperparathyroidism of renal origin: Secondary | ICD-10-CM | POA: Diagnosis not present

## 2014-11-18 DIAGNOSIS — N186 End stage renal disease: Secondary | ICD-10-CM | POA: Diagnosis not present

## 2014-11-20 DIAGNOSIS — D631 Anemia in chronic kidney disease: Secondary | ICD-10-CM | POA: Diagnosis not present

## 2014-11-20 DIAGNOSIS — N186 End stage renal disease: Secondary | ICD-10-CM | POA: Diagnosis not present

## 2014-11-20 DIAGNOSIS — N2581 Secondary hyperparathyroidism of renal origin: Secondary | ICD-10-CM | POA: Diagnosis not present

## 2014-11-21 ENCOUNTER — Ambulatory Visit (INDEPENDENT_AMBULATORY_CARE_PROVIDER_SITE_OTHER): Payer: Medicare Other | Admitting: Internal Medicine

## 2014-11-21 ENCOUNTER — Other Ambulatory Visit (INDEPENDENT_AMBULATORY_CARE_PROVIDER_SITE_OTHER): Payer: Medicare Other

## 2014-11-21 ENCOUNTER — Encounter: Payer: Self-pay | Admitting: Internal Medicine

## 2014-11-21 VITALS — BP 108/70 | HR 83 | Temp 98.3°F | Resp 12 | Ht 67.0 in | Wt 136.0 lb

## 2014-11-21 DIAGNOSIS — K861 Other chronic pancreatitis: Secondary | ICD-10-CM | POA: Diagnosis not present

## 2014-11-21 DIAGNOSIS — R946 Abnormal results of thyroid function studies: Secondary | ICD-10-CM | POA: Diagnosis not present

## 2014-11-21 DIAGNOSIS — N186 End stage renal disease: Secondary | ICD-10-CM

## 2014-11-21 DIAGNOSIS — R7989 Other specified abnormal findings of blood chemistry: Secondary | ICD-10-CM | POA: Insufficient documentation

## 2014-11-21 DIAGNOSIS — E039 Hypothyroidism, unspecified: Secondary | ICD-10-CM

## 2014-11-21 DIAGNOSIS — Z23 Encounter for immunization: Secondary | ICD-10-CM | POA: Diagnosis not present

## 2014-11-21 DIAGNOSIS — Z992 Dependence on renal dialysis: Secondary | ICD-10-CM

## 2014-11-21 LAB — TSH: TSH: 2.69 u[IU]/mL (ref 0.35–4.50)

## 2014-11-21 LAB — T4, FREE: Free T4: 0.65 ng/dL (ref 0.60–1.60)

## 2014-11-21 NOTE — Progress Notes (Signed)
Pre visit review using our clinic review tool, if applicable. No additional management support is needed unless otherwise documented below in the visit note. 

## 2014-11-21 NOTE — Assessment & Plan Note (Signed)
Unclear etiology. Will review records. Flare in the last year. She does not drink alcohol.

## 2014-11-21 NOTE — Assessment & Plan Note (Signed)
Previous kidney failure 30 years ago (unclear etiology per patient). Will need records, TTS dialysis. Had a kidney transplant in the past which was patent for 10 years. Now back on dialysis with intact access. No problems with dialysis. BP normal to low and takes her metoprolol as needed.

## 2014-11-21 NOTE — Progress Notes (Signed)
   Subjective:    Patient ID: [name], female    DOB: 1960/08/14, 54 y.o.   MRN: [id number]  HPI The patient is a new 54 YO female coming in for concerns about her thyroid. She had a parathyroidectomy in April and they ended up taking out a part of her thyroid as well. She was supposed to take thyroid medicine for 1 month then get her levels checked but she did not have a primary care doctor to do that. She has been off that medicine for some time. Denies fatigue, constipation, tremors, chills, cold or heat intolerance. She is ESRD for about 30 years, had a replacement kidney which lasted about 10 years. She is currently on the transplant list again. TTS dialysis.  PMH, [facility], social history reviewed and updated.   Review of Systems  Constitutional: Negative for fever, chills, activity change, appetite change, fatigue and unexpected weight change.  HENT: Negative.   Eyes: Negative.   Respiratory: Negative for cough, chest tightness, shortness of breath and wheezing.   Cardiovascular: Negative for chest pain, palpitations and leg swelling.  Gastrointestinal: Negative for nausea, abdominal pain, diarrhea, constipation and abdominal distention.  Endocrine: Negative.   Musculoskeletal: Negative.   Skin: Negative.   Neurological: Negative.   Psychiatric/Behavioral: Negative.       Objective:   Physical Exam  Constitutional: She is oriented to person, place, and time. She appears well-developed and well-nourished.  HENT:  Head: Normocephalic and atraumatic.  Eyes: EOM are normal.  Neck: Normal range of motion.  Cardiovascular: Normal rate and regular rhythm.   Right upper arm access with good thrill and bruit  Pulmonary/Chest: Effort normal and breath sounds normal. No respiratory distress. She has no wheezes. She has no rales.  Abdominal: Soft. Bowel sounds are normal. She exhibits no distension. There is no tenderness. There is no rebound.  Musculoskeletal: She exhibits no edema.    Neurological: She is alert and oriented to person, place, and time. Coordination normal.  Skin: Skin is warm and dry.  Psychiatric: She has a normal mood and affect.   Filed Vitals:   11/21/14 1043  BP: 108/70  Pulse: 83  Temp: 98.3 F (36.8 C)  TempSrc: Oral  Resp: 12  Height: 5\' 7"  (1.702 m)  Weight: 136 lb (61.689 kg)  SpO2: 99%      Assessment & Plan:

## 2014-11-21 NOTE — Addendum Note (Signed)
Addended by: Levonne Lapping on: 11/21/2014 11:25 AM   Modules accepted: Orders

## 2014-11-21 NOTE — Assessment & Plan Note (Signed)
Unclear if the partial resection from April will affect her. She is now off thyroid medicine for some time. Checking TSH and free T4 and restart synthroid as appropriate.

## 2014-11-21 NOTE — Patient Instructions (Addendum)
We will check the blood test for the thyroid today and call you with the results.   Come back in about 6-12 months for a visit and if you have any problems before then please feel free to call the office.   If you want to try a brace for your wrist that would be fine. The other thing to try would be icing the area or using ibuprofen for the anti-inflammatory.   Health Maintenance, Female Adopting a healthy lifestyle and getting preventive care can go a long way to promote health and wellness. Talk with your health care provider about what schedule of regular examinations is right for you. This is a good chance for you to check in with your provider about disease prevention and staying healthy. In between checkups, there are plenty of things you can do on your own. Experts have done a lot of research about which lifestyle changes and preventive measures are most likely to keep you healthy. Ask your health care provider for more information. WEIGHT AND DIET  Eat a healthy diet  Be sure to include plenty of vegetables, fruits, low-fat dairy products, and lean protein.  Do not eat a lot of foods high in solid fats, added sugars, or salt.  Get regular exercise. This is one of the most important things you can do for your health.  Most adults should exercise for at least 150 minutes each week. The exercise should increase your heart rate and make you sweat (moderate-intensity exercise).  Most adults should also do strengthening exercises at least twice a week. This is in addition to the moderate-intensity exercise.  Maintain a healthy weight  Body mass index (BMI) is a measurement that can be used to identify possible weight problems. It estimates body fat based on height and weight. Your health care provider can help determine your BMI and help you achieve or maintain a healthy weight.  For females 51 years of age and older:   A BMI below 18.5 is considered underweight.  A BMI of 18.5 to  24.9 is normal.  A BMI of 25 to 29.9 is considered overweight.  A BMI of 30 and above is considered obese.  Watch levels of cholesterol and blood lipids  You should start having your blood tested for lipids and cholesterol at 54 years of age, then have this test every 5 years.  You may need to have your cholesterol levels checked more often if:  Your lipid or cholesterol levels are high.  You are older than 54 years of age.  You are at high risk for heart disease.  CANCER SCREENING   Lung Cancer  Lung cancer screening is recommended for adults 92-36 years old who are at high risk for lung cancer because of a history of smoking.  A yearly low-dose CT scan of the lungs is recommended for people who:  Currently smoke.  Have quit within the past 15 years.  Have at least a 30-pack-year history of smoking. A pack year is smoking an average of one pack of cigarettes a day for 1 year.  Yearly screening should continue until it has been 15 years since you quit.  Yearly screening should stop if you develop a health problem that would prevent you from having lung cancer treatment.  Breast Cancer  Practice breast self-awareness. This means understanding how your breasts normally appear and feel.  It also means doing regular breast self-exams. Let your health care provider know about any changes, no matter how  small.  If you are in your 20s or 30s, you should have a clinical breast exam (CBE) by a health care provider every 1-3 years as part of a regular health exam.  If you are 40 or older, have a CBE every year. Also consider having a breast X-ray (mammogram) every year.  If you have a family history of breast cancer, talk to your health care provider about genetic screening.  If you are at high risk for breast cancer, talk to your health care provider about having an MRI and a mammogram every year.  Breast cancer gene (BRCA) assessment is recommended for women who have family  members with BRCA-related cancers. BRCA-related cancers include:  Breast.  Ovarian.  Tubal.  Peritoneal cancers.  Results of the assessment will determine the need for genetic counseling and BRCA1 and BRCA2 testing. Cervical Cancer Your health care provider may recommend that you be screened regularly for cancer of the pelvic organs (ovaries, uterus, and vagina). This screening involves a pelvic examination, including checking for microscopic changes to the surface of your cervix (Pap test). You may be encouraged to have this screening done every 3 years, beginning at age 45.  For women ages 65-65, health care providers may recommend pelvic exams and Pap testing every 3 years, or they may recommend the Pap and pelvic exam, combined with testing for human papilloma virus (HPV), every 5 years. Some types of HPV increase your risk of cervical cancer. Testing for HPV may also be done on women of any age with unclear Pap test results.  Other health care providers may not recommend any screening for nonpregnant women who are considered low risk for pelvic cancer and who do not have symptoms. Ask your health care provider if a screening pelvic exam is right for you.  If you have had past treatment for cervical cancer or a condition that could lead to cancer, you need Pap tests and screening for cancer for at least 20 years after your treatment. If Pap tests have been discontinued, your risk factors (such as having a new sexual partner) need to be reassessed to determine if screening should resume. Some women have medical problems that increase the chance of getting cervical cancer. In these cases, your health care provider may recommend more frequent screening and Pap tests. Colorectal Cancer  This type of cancer can be detected and often prevented.  Routine colorectal cancer screening usually begins at 54 years of age and continues through 54 years of age.  Your health care provider may recommend  screening at an earlier age if you have risk factors for colon cancer.  Your health care provider may also recommend using home test kits to check for hidden blood in the stool.  A small camera at the end of a tube can be used to examine your colon directly (sigmoidoscopy or colonoscopy). This is done to check for the earliest forms of colorectal cancer.  Routine screening usually begins at age 2.  Direct examination of the colon should be repeated every 5-10 years through 54 years of age. However, you may need to be screened more often if early forms of precancerous polyps or small growths are found. Skin Cancer  Check your skin from head to toe regularly.  Tell your health care provider about any new moles or changes in moles, especially if there is a change in a mole's shape or color.  Also tell your health care provider if you have a mole that is larger  than the size of a pencil eraser.  Always use sunscreen. Apply sunscreen liberally and repeatedly throughout the day.  Protect yourself by wearing long sleeves, pants, a wide-brimmed hat, and sunglasses whenever you are outside. HEART DISEASE, DIABETES, AND HIGH BLOOD PRESSURE   High blood pressure causes heart disease and increases the risk of stroke. High blood pressure is more likely to develop in:  People who have blood pressure in the high end of the normal range (130-139/85-89 mm Hg).  People who are overweight or obese.  People who are African American.  If you are 31-59 years of age, have your blood pressure checked every 3-5 years. If you are 41 years of age or older, have your blood pressure checked every year. You should have your blood pressure measured twice--once when you are at a hospital or clinic, and once when you are not at a hospital or clinic. Record the average of the two measurements. To check your blood pressure when you are not at a hospital or clinic, you can use:  An automated blood pressure machine at a  pharmacy.  A home blood pressure monitor.  If you are between 36 years and 98 years old, ask your health care provider if you should take aspirin to prevent strokes.  Have regular diabetes screenings. This involves taking a blood sample to check your fasting blood sugar level.  If you are at a normal weight and have a low risk for diabetes, have this test once every three years after 54 years of age.  If you are overweight and have a high risk for diabetes, consider being tested at a younger age or more often. PREVENTING INFECTION  Hepatitis B  If you have a higher risk for hepatitis B, you should be screened for this virus. You are considered at high risk for hepatitis B if:  You were born in a country where hepatitis B is common. Ask your health care provider which countries are considered high risk.  Your parents were born in a high-risk country, and you have not been immunized against hepatitis B (hepatitis B vaccine).  You have HIV or AIDS.  You use needles to inject street drugs.  You live with someone who has hepatitis B.  You have had sex with someone who has hepatitis B.  You get hemodialysis treatment.  You take certain medicines for conditions, including cancer, organ transplantation, and autoimmune conditions. Hepatitis C  Blood testing is recommended for:  Everyone born from [birth year] through 1965.  Anyone with known risk factors for hepatitis C. Sexually transmitted infections (STIs)  You should be screened for sexually transmitted infections (STIs) including gonorrhea and chlamydia if:  You are sexually active and are younger than 54 years of age.  You are older than 54 years of age and your health care provider tells you that you are at risk for this type of infection.  Your sexual activity has changed since you were last screened and you are at an increased risk for chlamydia or gonorrhea. Ask your health care provider if you are at risk.  If you do not  have HIV, but are at risk, it may be recommended that you take a prescription medicine daily to prevent HIV infection. This is called pre-exposure prophylaxis (PrEP). You are considered at risk if:  You are sexually active and do not regularly use condoms or know the HIV status of your partner(s).  You take drugs by injection.  You are sexually active with a  partner who has HIV. Talk with your health care provider about whether you are at high risk of being infected with HIV. If you choose to begin PrEP, you should first be tested for HIV. You should then be tested every 3 months for as long as you are taking PrEP.  PREGNANCY   If you are premenopausal and you may become pregnant, ask your health care provider about preconception counseling.  If you may become pregnant, take 400 to 800 micrograms (mcg) of folic acid every day.  If you want to prevent pregnancy, talk to your health care provider about birth control (contraception). OSTEOPOROSIS AND MENOPAUSE   Osteoporosis is a disease in which the bones lose minerals and strength with aging. This can result in serious bone fractures. Your risk for osteoporosis can be identified using a bone density scan.  If you are 60 years of age or older, or if you are at risk for osteoporosis and fractures, ask your health care provider if you should be screened.  Ask your health care provider whether you should take a calcium or vitamin D supplement to lower your risk for osteoporosis.  Menopause may have certain physical symptoms and risks.  Hormone replacement therapy may reduce some of these symptoms and risks. Talk to your health care provider about whether hormone replacement therapy is right for you.  HOME CARE INSTRUCTIONS   Schedule regular health, dental, and eye exams.  Stay current with your immunizations.   Do not use any tobacco products including cigarettes, chewing tobacco, or electronic cigarettes.  If you are pregnant, do not  drink alcohol.  If you are breastfeeding, limit how much and how often you drink alcohol.  Limit alcohol intake to no more than 1 drink per day for nonpregnant women. One drink equals 12 ounces of beer, 5 ounces of wine, or 1 ounces of hard liquor.  Do not use street drugs.  Do not share needles.  Ask your health care provider for help if you need support or information about quitting drugs.  Tell your health care provider if you often feel depressed.  Tell your health care provider if you have ever been abused or do not feel safe at home.   This information is not intended to replace advice given to you by your health care provider. Make sure you discuss any questions you have with your health care provider.   Document Released: 07/19/2010 Document Revised: 01/24/2014 Document Reviewed: 12/05/2012 Elsevier Interactive Patient Education [employer].

## 2014-11-22 DIAGNOSIS — N2581 Secondary hyperparathyroidism of renal origin: Secondary | ICD-10-CM | POA: Diagnosis not present

## 2014-11-22 DIAGNOSIS — N186 End stage renal disease: Secondary | ICD-10-CM | POA: Diagnosis not present

## 2014-11-22 DIAGNOSIS — D631 Anemia in chronic kidney disease: Secondary | ICD-10-CM | POA: Diagnosis not present

## 2014-11-25 DIAGNOSIS — N2581 Secondary hyperparathyroidism of renal origin: Secondary | ICD-10-CM | POA: Diagnosis not present

## 2014-11-25 DIAGNOSIS — N186 End stage renal disease: Secondary | ICD-10-CM | POA: Diagnosis not present

## 2014-11-25 DIAGNOSIS — D631 Anemia in chronic kidney disease: Secondary | ICD-10-CM | POA: Diagnosis not present

## 2014-11-26 ENCOUNTER — Emergency Department (HOSPITAL_COMMUNITY)
Admission: EM | Admit: 2014-11-26 | Discharge: 2014-11-27 | Disposition: A | Discharge status: Home / Self Care | Payer: Medicare Other | Attending: Physician Assistant | Admitting: Physician Assistant

## 2014-11-26 ENCOUNTER — Encounter (HOSPITAL_COMMUNITY): Payer: Self-pay | Admitting: Emergency Medicine

## 2014-11-26 DIAGNOSIS — Z720 Tobacco use: Secondary | ICD-10-CM | POA: Insufficient documentation

## 2014-11-26 DIAGNOSIS — N186 End stage renal disease: Secondary | ICD-10-CM | POA: Insufficient documentation

## 2014-11-26 DIAGNOSIS — Z992 Dependence on renal dialysis: Secondary | ICD-10-CM | POA: Insufficient documentation

## 2014-11-26 DIAGNOSIS — Z862 Personal history of diseases of the blood and blood-forming organs and certain disorders involving the immune mechanism: Secondary | ICD-10-CM | POA: Insufficient documentation

## 2014-11-26 DIAGNOSIS — Z79899 Other long term (current) drug therapy: Secondary | ICD-10-CM | POA: Insufficient documentation

## 2014-11-26 DIAGNOSIS — Z880 Allergy status to penicillin: Secondary | ICD-10-CM | POA: Insufficient documentation

## 2014-11-26 DIAGNOSIS — R1012 Left upper quadrant pain: Secondary | ICD-10-CM | POA: Diagnosis not present

## 2014-11-26 DIAGNOSIS — K861 Other chronic pancreatitis: Secondary | ICD-10-CM

## 2014-11-26 DIAGNOSIS — Z8659 Personal history of other mental and behavioral disorders: Secondary | ICD-10-CM | POA: Insufficient documentation

## 2014-11-26 DIAGNOSIS — R112 Nausea with vomiting, unspecified: Secondary | ICD-10-CM | POA: Diagnosis not present

## 2014-11-26 DIAGNOSIS — R110 Nausea: Secondary | ICD-10-CM

## 2014-11-26 DIAGNOSIS — I120 Hypertensive chronic kidney disease with stage 5 chronic kidney disease or end stage renal disease: Secondary | ICD-10-CM | POA: Diagnosis not present

## 2014-11-26 LAB — COMPREHENSIVE METABOLIC PANEL
ALBUMIN: 3.6 g/dL (ref 3.5–5.0)
ALK PHOS: 59 U/L (ref 38–126)
ALT: 5 U/L — AB (ref 14–54)
AST: 17 U/L (ref 15–41)
Anion gap: 14 (ref 5–15)
BILIRUBIN TOTAL: 0.9 mg/dL (ref 0.3–1.2)
BUN: 17 mg/dL (ref 6–20)
CALCIUM: 9.8 mg/dL (ref 8.9–10.3)
CO2: 23 mmol/L (ref 22–32)
CREATININE: 7.92 mg/dL — AB (ref 0.44–1.00)
Chloride: 96 mmol/L — ABNORMAL LOW (ref 101–111)
GFR calc Af Amer: 6 mL/min — ABNORMAL LOW (ref >60)
GFR calc non Af Amer: 5 mL/min — ABNORMAL LOW (ref >60)
GLUCOSE: 97 mg/dL (ref 65–99)
POTASSIUM: 3.5 mmol/L (ref 3.5–5.1)
Sodium: 133 mmol/L — ABNORMAL LOW (ref 135–145)
TOTAL PROTEIN: 7.9 g/dL (ref 6.5–8.1)

## 2014-11-26 LAB — CBC
HEMATOCRIT: 40.2 % (ref 36.0–46.0)
Hemoglobin: 13.7 g/dL (ref 12.0–15.0)
MCH: 31.1 pg (ref 26.0–34.0)
MCHC: 34.1 g/dL (ref 30.0–36.0)
MCV: 91.2 fL (ref 78.0–100.0)
PLATELETS: 189 10*3/uL (ref 150–400)
RBC: 4.41 MIL/uL (ref 3.87–5.11)
RDW: 14.2 % (ref 11.5–15.5)
WBC: 10.7 10*3/uL — ABNORMAL HIGH (ref 4.0–10.5)

## 2014-11-26 LAB — LIPASE, BLOOD: Lipase: 58 U/L — ABNORMAL HIGH (ref 11–51)

## 2014-11-26 MED ORDER — ONDANSETRON HCL 4 MG/2ML IJ SOLN
4.0000 mg | Freq: Once | INTRAMUSCULAR | Status: AC
  Administered 2014-11-26: 4 mg via INTRAVENOUS
  Filled 2014-11-26: qty 2

## 2014-11-26 MED ORDER — MORPHINE SULFATE (PF) 4 MG/ML IV SOLN
4.0000 mg | Freq: Once | INTRAVENOUS | Status: AC
  Administered 2014-11-26: 4 mg via INTRAVENOUS
  Filled 2014-11-26: qty 1

## 2014-11-26 MED ORDER — SODIUM CHLORIDE 0.9 % IV BOLUS (SEPSIS)
250.0000 mL | Freq: Once | INTRAVENOUS | Status: AC
  Administered 2014-11-26: 250 mL via INTRAVENOUS

## 2014-11-26 MED ORDER — ONDANSETRON 4 MG PO TBDP: 4.0000 mg | ORAL_TABLET | Freq: Three times a day (TID) | ORAL | Status: DC | PRN

## 2014-11-26 NOTE — Discharge Instructions (Signed)
Take the prescribed medication as directed to help with nausea.  Make sure you stay hydrated.  May wish to start with gentle diet and progress back to normal as tolerated. Follow-up with your primary care physician. Return to the ED for new or worsening symptoms.

## 2014-11-26 NOTE — ED Provider Notes (Signed)
CSN: [id number]     Arrival date & time 11/26/14  [id number] History   First MD Initiated Contact with Patient 11/26/14 0912     Chief Complaint  Patient presents with  . Abdominal Pain  . Nausea     (Consider location/radiation/quality/duration/timing/severity/associated sxs/prior Treatment) The history is provided by the patient and medical records.    54 year old female with history of hypertension, pancreatitis, anxiety, anemia, end-stage renal disease on hemodialysis, hyperparathyroidism, presenting to the ED for abdominal pain. Patient states on Monday she underwent a Cardiolite stress test at [place] for preoperative clearance as she is currently on the kidney transplant list.  This will be her second transplant.  She states after she received the vasodilator medication she began to feel nauseated. She states since this time she has had diarrhea and dry heaves. She began vomiting this morning, nonbloody and nonbilious. She states she does have epigastric abdominal pain which she states feels like her prior episodes of pancreatitis. She has history of chronic pancreatitis with unknown etiology. Patient has no known history of gallstones and denies alcohol use. Patient has been compliant with her dialysis, schedule Tuesday, Thursday, and Saturday. She had full treatment yesterday without complication.  No current chest pain or SOB. VSS.  Past Medical History  Diagnosis Date  . HTN (hypertension)   . Pancreatitis   . Anxiety   . Anemia   . Claustrophobia   . ESRD (end stage renal disease) on dialysis ([place])     "TTS; [place]" (05/12/2014)  . Glomerulonephritis   . Secondary hyperparathyroidism ([place])     [name] 05/11/2014   Past Surgical History  Procedure Laterality Date  . Kidney transplant  1997  . Arteriovenous graft placement Right 1990's?  . Dilatation & currettage/hysteroscopy with resectocope N/A 04/17/2013    Procedure: DILATATION &  CURETTAGE/HYSTEROSCOPY WITH RESECTOCOPE;  Surgeon: Marvene Staff, MD;  Location: [facility];  Service: Gynecology;  Laterality: N/A;  . Colonoscopy    . Peritoneal catheter insertion    . Parathyroidectomy  03/2000 05/12/2014    w/neck exploration & autotransplantation/notes 06/02/2010; w/neck exploration  . Dilation and curettage of uterus    . Peritoneal catheter removal  08/1999    [name] 06/02/2010  . Breast biopsy Left   . Thrombectomy and revision of arterioventous (av) goretex  graft Right 01/2002; 11/2003; 01/2004; 08/07/2004; 08/10/2004; 11/13/2005    [name] 5/1/2012Marland Kitchen [name] 5/16/2012Marland Kitchen [name] 5/16/2012Marland Kitchen [name] 5/16/2012Marland Kitchen [name] 06/02/2010; [name] 06/02/2010  . Av fistula placement Left 09/2000    upper arm/notes 06/02/2010  . Thrombectomy Left 02/2002    fistula/notes 06/02/2010  . Arteriovenous graft placement Left 07/2002    upper arm/notes 06/02/2010  . Thrombectomy and revision of arterioventous (av) goretex  graft Left 08/23/2002; 09/13/2002; 07/08/2003    [name] 5/16/2012Marland Kitchen [name] 5/16/2012Marland Kitchen [name] 06/02/2010  . Parathyroidectomy N/A 05/12/2014    Procedure: PARATHYROIDECTOMY AND NECK EXPLORATION;  Surgeon: Armandina Gemma, MD;  Location: [facility] OR;  Service: General;  Laterality: N/A;   Family History  Problem Relation Age of Onset  . Adopted: Yes  . Diabetes Maternal Aunt     x2  . Diabetes Maternal Uncle    Social History  Substance Use Topics  . Smoking status: Current Every Day Smoker -- 1.00 packs/day for 20 years    Types: Cigarettes  . Smokeless tobacco: Never Used  . Alcohol Use: No   OB History    No data available     Review of Systems  Gastrointestinal:  Positive for nausea, vomiting and abdominal pain.  All other systems reviewed and are negative.     Allergies  Ace inhibitors; Codeine; and Penicillins  Home Medications   Prior to Admission medications   Medication Sig Start Date End Date Taking? Authorizing Provider  levothyroxine (SYNTHROID, LEVOTHROID) 50 MCG  tablet Take 50 mcg by mouth daily. 07/23/14   Historical Provider, MD  metoprolol tartrate (LOPRESSOR) 25 MG tablet Take 25 mg by mouth daily.     Historical Provider, MD   BP 142/93 mmHg  Pulse 85  Temp(Src) 98.3 F (36.8 C) (Oral)  Resp 18  SpO2 100%   Physical Exam  Constitutional: She is oriented to person, place, and time. She appears well-developed and well-nourished. No distress.  HENT:  Head: Normocephalic and atraumatic.  Mouth/Throat: Oropharynx is clear and moist.  Dry mucous membranes  Eyes: Conjunctivae and EOM are normal. Pupils are equal, round, and reactive to light.  Neck: Normal range of motion. Neck supple.  Cardiovascular: Normal rate, regular rhythm and normal heart sounds.   Pulmonary/Chest: Effort normal and breath sounds normal. No respiratory distress. She has no wheezes.  Abdominal: Soft. Bowel sounds are normal. There is tenderness. There is no guarding.  Epigastric tenderness noted without rebound or guarding, no peritonitis  Musculoskeletal: Normal range of motion. She exhibits no edema.  Dialysis fistula right upper arm with thrill present, no signs of infection  Neurological: She is alert and oriented to person, place, and time.  Skin: Skin is warm and dry. She is not diaphoretic.  Psychiatric: She has a normal mood and affect.  Nursing note and vitals reviewed.   ED Course  Procedures (including critical care time) Labs Review Labs Reviewed  LIPASE, BLOOD - Abnormal; Notable for the following:    Lipase 58 (*)    All other components within normal limits  COMPREHENSIVE METABOLIC PANEL - Abnormal; Notable for the following:    Sodium 133 (*)    Chloride 96 (*)    Creatinine, Ser 7.92 (*)    ALT 5 (*)    GFR calc non Af Amer 5 (*)    GFR calc Af Amer 6 (*)    All other components within normal limits  CBC - Abnormal; Notable for the following:    WBC 10.7 (*)    All other components within normal limits  URINALYSIS, ROUTINE W REFLEX  MICROSCOPIC (NOT AT [facility])    Imaging Review No results found. I have personally reviewed and evaluated these images and lab results as part of my medical decision-making.   EKG Interpretation None      MDM   Final diagnoses:  Other chronic pancreatitis (HCC)  Nausea   54 year old female here with abdominal pain, nausea, and vomiting. History of chronic pancreatitis of unknown etiology. Patient is afebrile, nontoxic. She does appear mildly dehydrated. Chest tenderness in her epigastrium without rebound or guarding, no peritonitis. Labwork as above, lipase minimally elevated. No significant leukocytosis. Patient was given judicious IV fluid bolus of 250 mL given her dialysis status. She was treated with morphine and Zofran with improvement of her symptoms. She is currently tolerating oral fluids without difficulty. Vital signs remained stable. Patient has a history of chronic pancreatitis, do not feel she needs further emergent workup at this time. Patient will be discharged home to follow-up with her primary care physician. Rx Zofran.  Discussed plan with patient, he/she acknowledged understanding and agreed with plan of care.  Return precautions given for new or worsening  symptoms.  Larene Pickett, PA-C 11/26/14 [place], MD 11/26/14 1528

## 2014-11-26 NOTE — ED Notes (Signed)
Per EMS: pt from home for eval of LUQ abd pain since Monday, states she had a procedure done on Monday and ever since has had abd pain. Pt states she has had some nausea but no emesis and states pain reminds her of her pancreatitis. Pt is dialysis pt (T/TH/SA). nad noted, skin warm and dry, pt alert and oriented.

## 2014-11-27 DIAGNOSIS — D631 Anemia in chronic kidney disease: Secondary | ICD-10-CM | POA: Diagnosis not present

## 2014-11-27 DIAGNOSIS — N2581 Secondary hyperparathyroidism of renal origin: Secondary | ICD-10-CM | POA: Diagnosis not present

## 2014-11-27 DIAGNOSIS — N186 End stage renal disease: Secondary | ICD-10-CM | POA: Diagnosis not present

## 2014-11-28 ENCOUNTER — Telehealth: Payer: Self-pay | Admitting: Internal Medicine

## 2014-11-28 NOTE — Telephone Encounter (Signed)
spojke to patient advised of dr crawfords note on TSH

## 2014-11-28 NOTE — Telephone Encounter (Signed)
Pt request to speak to the assistant concern about her thyroid medication, she said she forgot what we told her yesterday. Please call her back  Phone # [phone]

## 2014-11-28 NOTE — Telephone Encounter (Signed)
Left message for patient to call me back. 

## 2014-11-29 DIAGNOSIS — N186 End stage renal disease: Secondary | ICD-10-CM | POA: Diagnosis not present

## 2014-11-29 DIAGNOSIS — N2581 Secondary hyperparathyroidism of renal origin: Secondary | ICD-10-CM | POA: Diagnosis not present

## 2014-11-29 DIAGNOSIS — D631 Anemia in chronic kidney disease: Secondary | ICD-10-CM | POA: Diagnosis not present

## 2014-12-01 DIAGNOSIS — N186 End stage renal disease: Secondary | ICD-10-CM | POA: Diagnosis not present

## 2014-12-01 DIAGNOSIS — Z992 Dependence on renal dialysis: Secondary | ICD-10-CM | POA: Diagnosis not present

## 2014-12-01 DIAGNOSIS — I871 Compression of vein: Secondary | ICD-10-CM | POA: Diagnosis not present

## 2014-12-01 DIAGNOSIS — T82858D Stenosis of vascular prosthetic devices, implants and grafts, subsequent encounter: Secondary | ICD-10-CM | POA: Diagnosis not present

## 2014-12-02 DIAGNOSIS — N186 End stage renal disease: Secondary | ICD-10-CM | POA: Diagnosis not present

## 2014-12-02 DIAGNOSIS — D631 Anemia in chronic kidney disease: Secondary | ICD-10-CM | POA: Diagnosis not present

## 2014-12-02 DIAGNOSIS — N2581 Secondary hyperparathyroidism of renal origin: Secondary | ICD-10-CM | POA: Diagnosis not present

## 2014-12-04 DIAGNOSIS — D631 Anemia in chronic kidney disease: Secondary | ICD-10-CM | POA: Diagnosis not present

## 2014-12-04 DIAGNOSIS — N2581 Secondary hyperparathyroidism of renal origin: Secondary | ICD-10-CM | POA: Diagnosis not present

## 2014-12-04 DIAGNOSIS — N186 End stage renal disease: Secondary | ICD-10-CM | POA: Diagnosis not present

## 2014-12-06 DIAGNOSIS — D631 Anemia in chronic kidney disease: Secondary | ICD-10-CM | POA: Diagnosis not present

## 2014-12-06 DIAGNOSIS — N2581 Secondary hyperparathyroidism of renal origin: Secondary | ICD-10-CM | POA: Diagnosis not present

## 2014-12-06 DIAGNOSIS — N186 End stage renal disease: Secondary | ICD-10-CM | POA: Diagnosis not present

## 2014-12-09 DIAGNOSIS — N186 End stage renal disease: Secondary | ICD-10-CM | POA: Diagnosis not present

## 2014-12-15 DIAGNOSIS — Z992 Dependence on renal dialysis: Secondary | ICD-10-CM | POA: Diagnosis not present

## 2014-12-15 DIAGNOSIS — N2581 Secondary hyperparathyroidism of renal origin: Secondary | ICD-10-CM | POA: Diagnosis not present

## 2014-12-15 DIAGNOSIS — N186 End stage renal disease: Secondary | ICD-10-CM | POA: Diagnosis not present

## 2014-12-15 DIAGNOSIS — T82858D Stenosis of vascular prosthetic devices, implants and grafts, subsequent encounter: Secondary | ICD-10-CM | POA: Diagnosis not present

## 2014-12-15 DIAGNOSIS — I871 Compression of vein: Secondary | ICD-10-CM | POA: Diagnosis not present

## 2014-12-15 DIAGNOSIS — D631 Anemia in chronic kidney disease: Secondary | ICD-10-CM | POA: Diagnosis not present

## 2014-12-16 DIAGNOSIS — N186 End stage renal disease: Secondary | ICD-10-CM | POA: Diagnosis not present

## 2014-12-16 DIAGNOSIS — D631 Anemia in chronic kidney disease: Secondary | ICD-10-CM | POA: Diagnosis not present

## 2014-12-16 DIAGNOSIS — N2581 Secondary hyperparathyroidism of renal origin: Secondary | ICD-10-CM | POA: Diagnosis not present

## 2014-12-17 DIAGNOSIS — N033 Chronic nephritic syndrome with diffuse mesangial proliferative glomerulonephritis: Secondary | ICD-10-CM | POA: Diagnosis not present

## 2014-12-17 DIAGNOSIS — Z992 Dependence on renal dialysis: Secondary | ICD-10-CM | POA: Diagnosis not present

## 2014-12-17 DIAGNOSIS — N186 End stage renal disease: Secondary | ICD-10-CM | POA: Diagnosis not present

## 2014-12-18 DIAGNOSIS — N186 End stage renal disease: Secondary | ICD-10-CM | POA: Diagnosis not present

## 2014-12-18 DIAGNOSIS — N2581 Secondary hyperparathyroidism of renal origin: Secondary | ICD-10-CM | POA: Diagnosis not present

## 2014-12-20 DIAGNOSIS — N2581 Secondary hyperparathyroidism of renal origin: Secondary | ICD-10-CM | POA: Diagnosis not present

## 2014-12-20 DIAGNOSIS — N186 End stage renal disease: Secondary | ICD-10-CM | POA: Diagnosis not present

## 2014-12-23 DIAGNOSIS — N186 End stage renal disease: Secondary | ICD-10-CM | POA: Diagnosis not present

## 2014-12-23 DIAGNOSIS — N2581 Secondary hyperparathyroidism of renal origin: Secondary | ICD-10-CM | POA: Diagnosis not present

## 2014-12-25 DIAGNOSIS — N186 End stage renal disease: Secondary | ICD-10-CM | POA: Diagnosis not present

## 2014-12-25 DIAGNOSIS — N2581 Secondary hyperparathyroidism of renal origin: Secondary | ICD-10-CM | POA: Diagnosis not present

## 2014-12-27 DIAGNOSIS — N2581 Secondary hyperparathyroidism of renal origin: Secondary | ICD-10-CM | POA: Diagnosis not present

## 2014-12-27 DIAGNOSIS — N186 End stage renal disease: Secondary | ICD-10-CM | POA: Diagnosis not present

## 2014-12-30 DIAGNOSIS — N2581 Secondary hyperparathyroidism of renal origin: Secondary | ICD-10-CM | POA: Diagnosis not present

## 2014-12-30 DIAGNOSIS — N186 End stage renal disease: Secondary | ICD-10-CM | POA: Diagnosis not present

## 2015-01-01 DIAGNOSIS — N186 End stage renal disease: Secondary | ICD-10-CM | POA: Diagnosis not present

## 2015-01-01 DIAGNOSIS — N2581 Secondary hyperparathyroidism of renal origin: Secondary | ICD-10-CM | POA: Diagnosis not present

## 2015-01-03 DIAGNOSIS — N2581 Secondary hyperparathyroidism of renal origin: Secondary | ICD-10-CM | POA: Diagnosis not present

## 2015-01-03 DIAGNOSIS — N186 End stage renal disease: Secondary | ICD-10-CM | POA: Diagnosis not present

## 2015-01-06 DIAGNOSIS — N186 End stage renal disease: Secondary | ICD-10-CM | POA: Diagnosis not present

## 2015-01-06 DIAGNOSIS — N2581 Secondary hyperparathyroidism of renal origin: Secondary | ICD-10-CM | POA: Diagnosis not present

## 2015-01-08 DIAGNOSIS — N2581 Secondary hyperparathyroidism of renal origin: Secondary | ICD-10-CM | POA: Diagnosis not present

## 2015-01-08 DIAGNOSIS — N186 End stage renal disease: Secondary | ICD-10-CM | POA: Diagnosis not present

## 2015-01-10 DIAGNOSIS — N186 End stage renal disease: Secondary | ICD-10-CM | POA: Diagnosis not present

## 2015-01-10 DIAGNOSIS — N2581 Secondary hyperparathyroidism of renal origin: Secondary | ICD-10-CM | POA: Diagnosis not present

## 2015-01-13 DIAGNOSIS — N186 End stage renal disease: Secondary | ICD-10-CM | POA: Diagnosis not present

## 2015-01-13 DIAGNOSIS — N2581 Secondary hyperparathyroidism of renal origin: Secondary | ICD-10-CM | POA: Diagnosis not present

## 2015-01-15 DIAGNOSIS — N2581 Secondary hyperparathyroidism of renal origin: Secondary | ICD-10-CM | POA: Diagnosis not present

## 2015-01-15 DIAGNOSIS — N186 End stage renal disease: Secondary | ICD-10-CM | POA: Diagnosis not present

## 2015-01-17 DIAGNOSIS — N2581 Secondary hyperparathyroidism of renal origin: Secondary | ICD-10-CM | POA: Diagnosis not present

## 2015-01-17 DIAGNOSIS — Z992 Dependence on renal dialysis: Secondary | ICD-10-CM | POA: Diagnosis not present

## 2015-01-17 DIAGNOSIS — N186 End stage renal disease: Secondary | ICD-10-CM | POA: Diagnosis not present

## 2015-01-17 DIAGNOSIS — N033 Chronic nephritic syndrome with diffuse mesangial proliferative glomerulonephritis: Secondary | ICD-10-CM | POA: Diagnosis not present

## 2015-01-20 DIAGNOSIS — N186 End stage renal disease: Secondary | ICD-10-CM | POA: Diagnosis not present

## 2015-01-20 DIAGNOSIS — D631 Anemia in chronic kidney disease: Secondary | ICD-10-CM | POA: Diagnosis not present

## 2015-01-20 DIAGNOSIS — N2581 Secondary hyperparathyroidism of renal origin: Secondary | ICD-10-CM | POA: Diagnosis not present

## 2015-01-22 DIAGNOSIS — N186 End stage renal disease: Secondary | ICD-10-CM | POA: Diagnosis not present

## 2015-01-22 DIAGNOSIS — N2581 Secondary hyperparathyroidism of renal origin: Secondary | ICD-10-CM | POA: Diagnosis not present

## 2015-01-22 DIAGNOSIS — D631 Anemia in chronic kidney disease: Secondary | ICD-10-CM | POA: Diagnosis not present

## 2015-01-24 DIAGNOSIS — N2581 Secondary hyperparathyroidism of renal origin: Secondary | ICD-10-CM | POA: Diagnosis not present

## 2015-01-24 DIAGNOSIS — N186 End stage renal disease: Secondary | ICD-10-CM | POA: Diagnosis not present

## 2015-01-24 DIAGNOSIS — D631 Anemia in chronic kidney disease: Secondary | ICD-10-CM | POA: Diagnosis not present

## 2015-01-27 DIAGNOSIS — N186 End stage renal disease: Secondary | ICD-10-CM | POA: Diagnosis not present

## 2015-01-27 DIAGNOSIS — D631 Anemia in chronic kidney disease: Secondary | ICD-10-CM | POA: Diagnosis not present

## 2015-01-27 DIAGNOSIS — N2581 Secondary hyperparathyroidism of renal origin: Secondary | ICD-10-CM | POA: Diagnosis not present

## 2015-01-29 DIAGNOSIS — N2581 Secondary hyperparathyroidism of renal origin: Secondary | ICD-10-CM | POA: Diagnosis not present

## 2015-01-29 DIAGNOSIS — D631 Anemia in chronic kidney disease: Secondary | ICD-10-CM | POA: Diagnosis not present

## 2015-01-29 DIAGNOSIS — N186 End stage renal disease: Secondary | ICD-10-CM | POA: Diagnosis not present

## 2015-01-31 DIAGNOSIS — D631 Anemia in chronic kidney disease: Secondary | ICD-10-CM | POA: Diagnosis not present

## 2015-01-31 DIAGNOSIS — N2581 Secondary hyperparathyroidism of renal origin: Secondary | ICD-10-CM | POA: Diagnosis not present

## 2015-01-31 DIAGNOSIS — N186 End stage renal disease: Secondary | ICD-10-CM | POA: Diagnosis not present

## 2015-02-03 DIAGNOSIS — N186 End stage renal disease: Secondary | ICD-10-CM | POA: Diagnosis not present

## 2015-02-03 DIAGNOSIS — N2581 Secondary hyperparathyroidism of renal origin: Secondary | ICD-10-CM | POA: Diagnosis not present

## 2015-02-03 DIAGNOSIS — D631 Anemia in chronic kidney disease: Secondary | ICD-10-CM | POA: Diagnosis not present

## 2015-02-05 DIAGNOSIS — N2581 Secondary hyperparathyroidism of renal origin: Secondary | ICD-10-CM | POA: Diagnosis not present

## 2015-02-05 DIAGNOSIS — D631 Anemia in chronic kidney disease: Secondary | ICD-10-CM | POA: Diagnosis not present

## 2015-02-05 DIAGNOSIS — N186 End stage renal disease: Secondary | ICD-10-CM | POA: Diagnosis not present

## 2015-02-07 DIAGNOSIS — N186 End stage renal disease: Secondary | ICD-10-CM | POA: Diagnosis not present

## 2015-02-07 DIAGNOSIS — N2581 Secondary hyperparathyroidism of renal origin: Secondary | ICD-10-CM | POA: Diagnosis not present

## 2015-02-07 DIAGNOSIS — D631 Anemia in chronic kidney disease: Secondary | ICD-10-CM | POA: Diagnosis not present

## 2015-02-10 DIAGNOSIS — D631 Anemia in chronic kidney disease: Secondary | ICD-10-CM | POA: Diagnosis not present

## 2015-02-10 DIAGNOSIS — N2581 Secondary hyperparathyroidism of renal origin: Secondary | ICD-10-CM | POA: Diagnosis not present

## 2015-02-10 DIAGNOSIS — N186 End stage renal disease: Secondary | ICD-10-CM | POA: Diagnosis not present

## 2015-02-12 DIAGNOSIS — D631 Anemia in chronic kidney disease: Secondary | ICD-10-CM | POA: Diagnosis not present

## 2015-02-12 DIAGNOSIS — N2581 Secondary hyperparathyroidism of renal origin: Secondary | ICD-10-CM | POA: Diagnosis not present

## 2015-02-12 DIAGNOSIS — N186 End stage renal disease: Secondary | ICD-10-CM | POA: Diagnosis not present

## 2015-02-14 DIAGNOSIS — N186 End stage renal disease: Secondary | ICD-10-CM | POA: Diagnosis not present

## 2015-02-14 DIAGNOSIS — N2581 Secondary hyperparathyroidism of renal origin: Secondary | ICD-10-CM | POA: Diagnosis not present

## 2015-02-14 DIAGNOSIS — D631 Anemia in chronic kidney disease: Secondary | ICD-10-CM | POA: Diagnosis not present

## 2015-02-17 DIAGNOSIS — D631 Anemia in chronic kidney disease: Secondary | ICD-10-CM | POA: Diagnosis not present

## 2015-02-17 DIAGNOSIS — N033 Chronic nephritic syndrome with diffuse mesangial proliferative glomerulonephritis: Secondary | ICD-10-CM | POA: Diagnosis not present

## 2015-02-17 DIAGNOSIS — N2581 Secondary hyperparathyroidism of renal origin: Secondary | ICD-10-CM | POA: Diagnosis not present

## 2015-02-17 DIAGNOSIS — Z992 Dependence on renal dialysis: Secondary | ICD-10-CM | POA: Diagnosis not present

## 2015-02-17 DIAGNOSIS — N186 End stage renal disease: Secondary | ICD-10-CM | POA: Diagnosis not present

## 2015-02-19 DIAGNOSIS — N2581 Secondary hyperparathyroidism of renal origin: Secondary | ICD-10-CM | POA: Diagnosis not present

## 2015-02-19 DIAGNOSIS — N250 Renal osteodystrophy: Secondary | ICD-10-CM | POA: Diagnosis not present

## 2015-02-19 DIAGNOSIS — N186 End stage renal disease: Secondary | ICD-10-CM | POA: Diagnosis not present

## 2015-02-21 DIAGNOSIS — N250 Renal osteodystrophy: Secondary | ICD-10-CM | POA: Diagnosis not present

## 2015-02-21 DIAGNOSIS — N186 End stage renal disease: Secondary | ICD-10-CM | POA: Diagnosis not present

## 2015-02-21 DIAGNOSIS — N2581 Secondary hyperparathyroidism of renal origin: Secondary | ICD-10-CM | POA: Diagnosis not present

## 2015-02-24 DIAGNOSIS — N2581 Secondary hyperparathyroidism of renal origin: Secondary | ICD-10-CM | POA: Diagnosis not present

## 2015-02-24 DIAGNOSIS — N186 End stage renal disease: Secondary | ICD-10-CM | POA: Diagnosis not present

## 2015-02-24 DIAGNOSIS — N250 Renal osteodystrophy: Secondary | ICD-10-CM | POA: Diagnosis not present

## 2015-02-26 DIAGNOSIS — N186 End stage renal disease: Secondary | ICD-10-CM | POA: Diagnosis not present

## 2015-02-26 DIAGNOSIS — N2581 Secondary hyperparathyroidism of renal origin: Secondary | ICD-10-CM | POA: Diagnosis not present

## 2015-02-26 DIAGNOSIS — N250 Renal osteodystrophy: Secondary | ICD-10-CM | POA: Diagnosis not present

## 2015-02-28 DIAGNOSIS — N186 End stage renal disease: Secondary | ICD-10-CM | POA: Diagnosis not present

## 2015-02-28 DIAGNOSIS — N2581 Secondary hyperparathyroidism of renal origin: Secondary | ICD-10-CM | POA: Diagnosis not present

## 2015-02-28 DIAGNOSIS — N250 Renal osteodystrophy: Secondary | ICD-10-CM | POA: Diagnosis not present

## 2015-03-03 DIAGNOSIS — N2581 Secondary hyperparathyroidism of renal origin: Secondary | ICD-10-CM | POA: Diagnosis not present

## 2015-03-03 DIAGNOSIS — N186 End stage renal disease: Secondary | ICD-10-CM | POA: Diagnosis not present

## 2015-03-03 DIAGNOSIS — N250 Renal osteodystrophy: Secondary | ICD-10-CM | POA: Diagnosis not present

## 2015-03-05 DIAGNOSIS — N186 End stage renal disease: Secondary | ICD-10-CM | POA: Diagnosis not present

## 2015-03-05 DIAGNOSIS — N250 Renal osteodystrophy: Secondary | ICD-10-CM | POA: Diagnosis not present

## 2015-03-05 DIAGNOSIS — N2581 Secondary hyperparathyroidism of renal origin: Secondary | ICD-10-CM | POA: Diagnosis not present

## 2015-03-07 DIAGNOSIS — N186 End stage renal disease: Secondary | ICD-10-CM | POA: Diagnosis not present

## 2015-03-07 DIAGNOSIS — N2581 Secondary hyperparathyroidism of renal origin: Secondary | ICD-10-CM | POA: Diagnosis not present

## 2015-03-07 DIAGNOSIS — N250 Renal osteodystrophy: Secondary | ICD-10-CM | POA: Diagnosis not present

## 2015-03-10 DIAGNOSIS — N250 Renal osteodystrophy: Secondary | ICD-10-CM | POA: Diagnosis not present

## 2015-03-10 DIAGNOSIS — N186 End stage renal disease: Secondary | ICD-10-CM | POA: Diagnosis not present

## 2015-03-10 DIAGNOSIS — N2581 Secondary hyperparathyroidism of renal origin: Secondary | ICD-10-CM | POA: Diagnosis not present

## 2015-03-12 DIAGNOSIS — N2581 Secondary hyperparathyroidism of renal origin: Secondary | ICD-10-CM | POA: Diagnosis not present

## 2015-03-12 DIAGNOSIS — N250 Renal osteodystrophy: Secondary | ICD-10-CM | POA: Diagnosis not present

## 2015-03-12 DIAGNOSIS — N186 End stage renal disease: Secondary | ICD-10-CM | POA: Diagnosis not present

## 2015-03-14 DIAGNOSIS — N250 Renal osteodystrophy: Secondary | ICD-10-CM | POA: Diagnosis not present

## 2015-03-14 DIAGNOSIS — N2581 Secondary hyperparathyroidism of renal origin: Secondary | ICD-10-CM | POA: Diagnosis not present

## 2015-03-14 DIAGNOSIS — N186 End stage renal disease: Secondary | ICD-10-CM | POA: Diagnosis not present

## 2015-03-17 DIAGNOSIS — Z992 Dependence on renal dialysis: Secondary | ICD-10-CM | POA: Diagnosis not present

## 2015-03-17 DIAGNOSIS — N186 End stage renal disease: Secondary | ICD-10-CM | POA: Diagnosis not present

## 2015-03-17 DIAGNOSIS — N250 Renal osteodystrophy: Secondary | ICD-10-CM | POA: Diagnosis not present

## 2015-03-17 DIAGNOSIS — N2581 Secondary hyperparathyroidism of renal origin: Secondary | ICD-10-CM | POA: Diagnosis not present

## 2015-03-17 DIAGNOSIS — N033 Chronic nephritic syndrome with diffuse mesangial proliferative glomerulonephritis: Secondary | ICD-10-CM | POA: Diagnosis not present

## 2015-03-19 DIAGNOSIS — D631 Anemia in chronic kidney disease: Secondary | ICD-10-CM | POA: Diagnosis not present

## 2015-03-19 DIAGNOSIS — N186 End stage renal disease: Secondary | ICD-10-CM | POA: Diagnosis not present

## 2015-03-19 DIAGNOSIS — N2581 Secondary hyperparathyroidism of renal origin: Secondary | ICD-10-CM | POA: Diagnosis not present

## 2015-03-21 DIAGNOSIS — N2581 Secondary hyperparathyroidism of renal origin: Secondary | ICD-10-CM | POA: Diagnosis not present

## 2015-03-21 DIAGNOSIS — D631 Anemia in chronic kidney disease: Secondary | ICD-10-CM | POA: Diagnosis not present

## 2015-03-21 DIAGNOSIS — N186 End stage renal disease: Secondary | ICD-10-CM | POA: Diagnosis not present

## 2015-03-24 ENCOUNTER — Emergency Department (HOSPITAL_COMMUNITY): Payer: Medicare Other

## 2015-03-24 ENCOUNTER — Inpatient Hospital Stay (HOSPITAL_COMMUNITY)
Admission: EM | Admit: 2015-03-24 | Discharge: 2015-03-27 | DRG: 391 | Disposition: A | Discharge status: Home / Self Care | Payer: Medicare Other | Attending: Internal Medicine | Admitting: Internal Medicine

## 2015-03-24 ENCOUNTER — Encounter (HOSPITAL_COMMUNITY): Payer: Self-pay | Admitting: Emergency Medicine

## 2015-03-24 DIAGNOSIS — K529 Noninfective gastroenteritis and colitis, unspecified: Secondary | ICD-10-CM | POA: Diagnosis not present

## 2015-03-24 DIAGNOSIS — R188 Other ascites: Secondary | ICD-10-CM | POA: Diagnosis not present

## 2015-03-24 DIAGNOSIS — N281 Cyst of kidney, acquired: Secondary | ICD-10-CM | POA: Diagnosis not present

## 2015-03-24 DIAGNOSIS — R109 Unspecified abdominal pain: Secondary | ICD-10-CM

## 2015-03-24 DIAGNOSIS — R1012 Left upper quadrant pain: Secondary | ICD-10-CM | POA: Diagnosis not present

## 2015-03-24 DIAGNOSIS — R112 Nausea with vomiting, unspecified: Secondary | ICD-10-CM

## 2015-03-24 DIAGNOSIS — E039 Hypothyroidism, unspecified: Secondary | ICD-10-CM | POA: Diagnosis present

## 2015-03-24 DIAGNOSIS — I120 Hypertensive chronic kidney disease with stage 5 chronic kidney disease or end stage renal disease: Secondary | ICD-10-CM | POA: Diagnosis not present

## 2015-03-24 DIAGNOSIS — R1110 Vomiting, unspecified: Secondary | ICD-10-CM | POA: Diagnosis not present

## 2015-03-24 DIAGNOSIS — R000 Tachycardia, unspecified: Secondary | ICD-10-CM | POA: Diagnosis present

## 2015-03-24 DIAGNOSIS — N186 End stage renal disease: Secondary | ICD-10-CM | POA: Diagnosis present

## 2015-03-24 DIAGNOSIS — Z992 Dependence on renal dialysis: Secondary | ICD-10-CM

## 2015-03-24 DIAGNOSIS — R1013 Epigastric pain: Secondary | ICD-10-CM | POA: Diagnosis not present

## 2015-03-24 DIAGNOSIS — N2581 Secondary hyperparathyroidism of renal origin: Secondary | ICD-10-CM | POA: Diagnosis not present

## 2015-03-24 DIAGNOSIS — R1032 Left lower quadrant pain: Secondary | ICD-10-CM | POA: Diagnosis not present

## 2015-03-24 DIAGNOSIS — Z87898 Personal history of other specified conditions: Secondary | ICD-10-CM

## 2015-03-24 DIAGNOSIS — Z79890 Hormone replacement therapy (postmenopausal): Secondary | ICD-10-CM | POA: Diagnosis not present

## 2015-03-24 HISTORY — DX: Hypothyroidism, unspecified: E03.9

## 2015-03-24 HISTORY — DX: Disorder of kidney and ureter, unspecified: N28.9

## 2015-03-24 HISTORY — DX: Cardiac murmur, unspecified: R01.1

## 2015-03-24 LAB — COMPREHENSIVE METABOLIC PANEL
ALT: 6 U/L — ABNORMAL LOW (ref 14–54)
ANION GAP: 18 — AB (ref 5–15)
AST: 16 U/L (ref 15–41)
Albumin: 3.5 g/dL (ref 3.5–5.0)
Alkaline Phosphatase: 41 U/L (ref 38–126)
BUN: 35 mg/dL — ABNORMAL HIGH (ref 6–20)
CALCIUM: 10.1 mg/dL (ref 8.9–10.3)
CHLORIDE: 98 mmol/L — AB (ref 101–111)
CO2: 21 mmol/L — AB (ref 22–32)
Creatinine, Ser: 11.58 mg/dL — ABNORMAL HIGH (ref 0.44–1.00)
GFR, EST AFRICAN AMERICAN: 4 mL/min — AB (ref >60)
GFR, EST NON AFRICAN AMERICAN: 3 mL/min — AB (ref >60)
Glucose, Bld: 98 mg/dL (ref 65–99)
Potassium: 3.9 mmol/L (ref 3.5–5.1)
SODIUM: 137 mmol/L (ref 135–145)
Total Bilirubin: 0.4 mg/dL (ref 0.3–1.2)
Total Protein: 7 g/dL (ref 6.5–8.1)

## 2015-03-24 LAB — CBC
HCT: 37.5 % (ref 36.0–46.0)
HEMOGLOBIN: 12.6 g/dL (ref 12.0–15.0)
MCH: 30.7 pg (ref 26.0–34.0)
MCHC: 33.6 g/dL (ref 30.0–36.0)
MCV: 91.2 fL (ref 78.0–100.0)
Platelets: 205 10*3/uL (ref 150–400)
RBC: 4.11 MIL/uL (ref 3.87–5.11)
RDW: 13.3 % (ref 11.5–15.5)
WBC: 8.6 10*3/uL (ref 4.0–10.5)

## 2015-03-24 LAB — TROPONIN I

## 2015-03-24 LAB — LIPASE, BLOOD: LIPASE: 44 U/L (ref 11–51)

## 2015-03-24 MED ORDER — SODIUM CHLORIDE 0.9 % IV BOLUS (SEPSIS)
250.0000 mL | Freq: Once | INTRAVENOUS | Status: AC
  Administered 2015-03-24: 250 mL via INTRAVENOUS

## 2015-03-24 MED ORDER — HEPARIN SODIUM (PORCINE) 5000 UNIT/ML IJ SOLN
5000.0000 [IU] | Freq: Three times a day (TID) | INTRAMUSCULAR | Status: DC
  Filled 2015-03-24: qty 1

## 2015-03-24 MED ORDER — HYDROMORPHONE HCL 1 MG/ML IJ SOLN
0.5000 mg | Freq: Once | INTRAMUSCULAR | Status: AC
  Administered 2015-03-24: 0.5 mg via INTRAVENOUS
  Filled 2015-03-24: qty 1

## 2015-03-24 MED ORDER — HEPARIN SODIUM (PORCINE) 1000 UNIT/ML DIALYSIS
1000.0000 [IU] | INTRAMUSCULAR | Status: DC | PRN
  Filled 2015-03-24: qty 1

## 2015-03-24 MED ORDER — OXYCODONE-ACETAMINOPHEN 5-325 MG PO TABS: ORAL_TABLET | ORAL | Status: DC

## 2015-03-24 MED ORDER — LIDOCAINE-PRILOCAINE 2.5-2.5 % EX CREA
1.0000 "application " | TOPICAL_CREAM | CUTANEOUS | Status: DC | PRN
  Filled 2015-03-24: qty 5

## 2015-03-24 MED ORDER — MORPHINE SULFATE (PF) 4 MG/ML IV SOLN
4.0000 mg | Freq: Once | INTRAVENOUS | Status: AC
  Administered 2015-03-24: 4 mg via INTRAVENOUS
  Filled 2015-03-24: qty 1

## 2015-03-24 MED ORDER — DOXERCALCIFEROL 4 MCG/2ML IV SOLN
4.0000 ug | INTRAVENOUS | Status: DC
  Filled 2015-03-24: qty 2

## 2015-03-24 MED ORDER — ONDANSETRON HCL 4 MG/2ML IJ SOLN
4.0000 mg | Freq: Once | INTRAMUSCULAR | Status: AC
  Administered 2015-03-24: 4 mg via INTRAVENOUS

## 2015-03-24 MED ORDER — IOHEXOL 300 MG/ML  SOLN
80.0000 mL | Freq: Once | INTRAMUSCULAR | Status: AC | PRN
  Administered 2015-03-24: 80 mL via INTRAVENOUS

## 2015-03-24 MED ORDER — SODIUM CHLORIDE 0.9 % IV SOLN: 100.0000 mL | INTRAVENOUS | Status: DC | PRN

## 2015-03-24 MED ORDER — ACETAMINOPHEN 325 MG PO TABS: 650.0000 mg | ORAL_TABLET | Freq: Four times a day (QID) | ORAL | Status: DC | PRN

## 2015-03-24 MED ORDER — ONDANSETRON HCL 4 MG PO TABS
4.0000 mg | ORAL_TABLET | Freq: Four times a day (QID) | ORAL | Status: DC | PRN
  Administered 2015-03-25: 4 mg via ORAL
  Filled 2015-03-24: qty 1

## 2015-03-24 MED ORDER — ONDANSETRON HCL 4 MG/2ML IJ SOLN
4.0000 mg | Freq: Four times a day (QID) | INTRAMUSCULAR | Status: DC | PRN
  Administered 2015-03-25: 4 mg via INTRAVENOUS
  Filled 2015-03-24: qty 2

## 2015-03-24 MED ORDER — SODIUM CHLORIDE 0.9 % IV SOLN
INTRAVENOUS | Status: DC
  Administered 2015-03-24: 16:00:00 via INTRAVENOUS

## 2015-03-24 MED ORDER — ALTEPLASE 2 MG IJ SOLR
2.0000 mg | Freq: Once | INTRAMUSCULAR | Status: DC | PRN
  Filled 2015-03-24: qty 2

## 2015-03-24 MED ORDER — ONDANSETRON HCL 4 MG/2ML IJ SOLN
INTRAMUSCULAR | Status: AC
  Filled 2015-03-24: qty 2

## 2015-03-24 MED ORDER — PENTAFLUOROPROP-TETRAFLUOROETH EX AERO: 1.0000 "application " | INHALATION_SPRAY | CUTANEOUS | Status: DC | PRN

## 2015-03-24 MED ORDER — LIDOCAINE HCL (PF) 1 % IJ SOLN
5.0000 mL | INTRAMUSCULAR | Status: DC | PRN
  Filled 2015-03-24: qty 5

## 2015-03-24 MED ORDER — ONDANSETRON HCL 4 MG PO TABS: 4.0000 mg | ORAL_TABLET | Freq: Three times a day (TID) | ORAL | Status: DC | PRN

## 2015-03-24 MED ORDER — MORPHINE SULFATE (PF) 2 MG/ML IV SOLN
2.0000 mg | INTRAVENOUS | Status: DC | PRN
  Administered 2015-03-24 – 2015-03-25 (×4): 2 mg via INTRAVENOUS
  Filled 2015-03-24 (×3): qty 1

## 2015-03-24 MED ORDER — ACETAMINOPHEN 650 MG RE SUPP: 650.0000 mg | Freq: Four times a day (QID) | RECTAL | Status: DC | PRN

## 2015-03-24 MED ORDER — HEPARIN SODIUM (PORCINE) 1000 UNIT/ML DIALYSIS
20.0000 [IU]/kg | INTRAMUSCULAR | Status: DC | PRN
  Filled 2015-03-24: qty 2

## 2015-03-24 MED ORDER — ONDANSETRON HCL 4 MG/2ML IJ SOLN: 4.0000 mg | Freq: Once | INTRAMUSCULAR | Status: DC

## 2015-03-24 NOTE — ED Provider Notes (Signed)
CSN: [id number]     Arrival date & time 03/24/15  0255 History   First MD Initiated Contact with Patient 03/24/15 [phone]     Chief Complaint  Patient presents with  . Abdominal Pain     (Consider location/radiation/quality/duration/timing/severity/associated sxs/prior Treatment) HPI  Blood pressure 184/94, pulse 88, temperature 98 F (36.7 C), temperature source Oral, height 5\' 7"  (1.702 m), weight 62 kg, SpO2 96 %.  [name] is a 55 y.o. female with past medical history significant for ESRD, anxiety and hypertension complaining of epigastric abdominal pain onset yesterday evening after eating which she rates at 9 out of 10 with associated 4 episodes of nonbloody, nonbilious, non-coffee ground emesis and looser than normal stool. Symptoms are consistent with prior exacerbations of her chronic pancreatitis. She denies fevers, chills, melena, hematochezia. States she is unable to tolerate by mouth's. Patient was scheduled to be dialyzed early this a.m. She denies chest pain but endorses shortness of breath only when the pain becomes severe. No exacerbating or alleviating factors identified. She describes the pain is colicky.  Past Medical History  Diagnosis Date  . HTN (hypertension)   . Pancreatitis   . Anxiety   . Anemia   . Claustrophobia   . ESRD (end stage renal disease) on dialysis ([place])     "TTS; [facility]" (05/12/2014)  . Glomerulonephritis   . Secondary hyperparathyroidism ([place])     [name] 05/11/2014  . Renal insufficiency    Past Surgical History  Procedure Laterality Date  . Kidney transplant  1997  . Arteriovenous graft placement Right 1990's?  . Dilatation & currettage/hysteroscopy with resectocope N/A 04/17/2013    Procedure: DILATATION & CURETTAGE/HYSTEROSCOPY WITH RESECTOCOPE;  Surgeon: Marvene Staff, MD;  Location: [facility];  Service: Gynecology;  Laterality: N/A;  . Colonoscopy    . Peritoneal catheter insertion    . Parathyroidectomy  03/2000  05/12/2014    w/neck exploration & autotransplantation/notes 06/02/2010; w/neck exploration  . Dilation and curettage of uterus    . Peritoneal catheter removal  08/1999    [name] 06/02/2010  . Breast biopsy Left   . Thrombectomy and revision of arterioventous (av) goretex  graft Right 01/2002; 11/2003; 01/2004; 08/07/2004; 08/10/2004; 11/13/2005    [name] 5/1/2012Marland Kitchen [name] 5/16/2012Marland Kitchen [name] 5/16/2012Marland Kitchen [name] 5/16/2012Marland Kitchen [name] 06/02/2010; [name] 06/02/2010  . Av fistula placement Left 09/2000    upper arm/notes 06/02/2010  . Thrombectomy Left 02/2002    fistula/notes 06/02/2010  . Arteriovenous graft placement Left 07/2002    upper arm/notes 06/02/2010  . Thrombectomy and revision of arterioventous (av) goretex  graft Left 08/23/2002; 09/13/2002; 07/08/2003    [name] 5/16/2012Marland Kitchen [name] 5/16/2012Marland Kitchen [name] 06/02/2010  . Parathyroidectomy N/A 05/12/2014    Procedure: PARATHYROIDECTOMY AND NECK EXPLORATION;  Surgeon: Armandina Gemma, MD;  Location: [facility];  Service: General;  Laterality: N/A;   Family History  Problem Relation Age of Onset  . Adopted: Yes  . Diabetes Maternal Aunt     x2  . Diabetes Maternal Uncle    Social History  Substance Use Topics  . Smoking status: Current Every Day Smoker -- 1.00 packs/day for 20 years    Types: Cigarettes  . Smokeless tobacco: Never Used  . Alcohol Use: No   OB History    No data available     Review of Systems  10 systems reviewed and found to be negative, except as noted in the HPI.  Allergies  Ace inhibitors; Codeine; and Penicillins  Home Medications   Prior to  Admission medications   Medication Sig Start Date End Date Taking? Authorizing Provider  levothyroxine (SYNTHROID, LEVOTHROID) 50 MCG tablet Take 50 mcg by mouth daily as needed (as instructed by MD).  07/23/14  Yes Historical Provider, MD  metoprolol tartrate (LOPRESSOR) 25 MG tablet Take 25 mg by mouth daily as needed (as instructed by MD).    Yes Historical Provider, MD  ondansetron (ZOFRAN ODT)  4 MG disintegrating tablet Take 1 tablet (4 mg total) by mouth every 8 (eight) hours as needed for nausea. 11/26/14  Yes Larene Pickett, PA-C  ondansetron (ZOFRAN) 4 MG tablet Take 1 tablet (4 mg total) by mouth every 8 (eight) hours as needed for nausea or vomiting. 03/24/15   Chabely Norby, PA-C  oxyCODONE-acetaminophen (PERCOCET/ROXICET) 5-325 MG tablet 1 to 2 tabs PO q6hrs  PRN for pain 03/24/15   Elmyra Ricks Kaliya Shreiner, PA-C   BP 156/74 mmHg  Pulse 88  Temp(Src) 98 F (36.7 C) (Oral)  Resp 20  Ht 5\' 7"  (1.702 m)  Wt 62 kg  BMI 21.40 kg/m2  SpO2 100% Physical Exam  Constitutional: She is oriented to person, place, and time. She appears well-developed and well-nourished. No distress.  HENT:  Head: Normocephalic and atraumatic.  Mouth/Throat: Oropharynx is clear and moist.  Eyes: Conjunctivae and EOM are normal. Pupils are equal, round, and reactive to light.  Cardiovascular: Normal rate, regular rhythm and intact distal pulses.   Fistula in place to right antecubital area with good thrill  Pulmonary/Chest: Effort normal and breath sounds normal. No stridor. No respiratory distress. She has no wheezes. She has no rales. She exhibits no tenderness.  Abdominal: Soft. Bowel sounds are normal. She exhibits no distension and no mass. There is tenderness. There is no rebound and no guarding.  Musculoskeletal: Normal range of motion.  Neurological: She is alert and oriented to person, place, and time.  Psychiatric: She has a normal mood and affect.  Nursing note and vitals reviewed.   ED Course  Procedures (including critical care time) Labs Review Labs Reviewed  COMPREHENSIVE METABOLIC PANEL - Abnormal; Notable for the following:    Chloride 98 (*)    CO2 21 (*)    BUN 35 (*)    Creatinine, Ser 11.58 (*)    ALT 6 (*)    GFR calc non Af Amer 3 (*)    GFR calc Af Amer 4 (*)    Anion gap 18 (*)    All other components within normal limits  LIPASE, BLOOD  CBC  TROPONIN I    Imaging  Review Ct Abdomen Pelvis W Contrast  03/24/2015  CLINICAL DATA:  Upper abdominal pain starting last night 11 p.m., history of chronic pancreatitis EXAM: CT ABDOMEN AND PELVIS WITH CONTRAST TECHNIQUE: Multidetector CT imaging of the abdomen and pelvis was performed using the standard protocol following bolus administration of intravenous contrast. CONTRAST:  56mL OMNIPAQUE IOHEXOL 300 MG/ML  SOLN COMPARISON:  CT scan 09/11/2005 FINDINGS: Lower chest:  Lung bases are unremarkable. Hepatobiliary: No focal hepatic mass. No intrahepatic biliary ductal dilatation. Pancreas: No evidence of acute pancreatitis. Main pancreatic duct measures 3.4 mm in diameter without significant change from prior exam. No focal pancreatic mass. Spleen: No focal splenic mass. There are sub- diaphragmatic calcification just above the posterior aspect of splenic capsule. Adrenals/Urinary Tract: Again noted atrophic kidney with multiple renal cysts consistent with adult polycystic kidney disease. Punctate nonobstructive bilateral renal calcifications. No hydronephrosis or hydroureter. No adrenal gland mass is noted. Delayed renal images shows  absent excretion bilateral renal collecting system and bilateral ureter. Stomach/Bowel: There are multiple fluid distended in mid abdomen mid pelvis and right lower quadrant. There is some enhancement of the small bowel wall axial image 41 mid anterior abdomen. Findings are highly suspicious for segmental small bowel enteritis, less likely partial small bowel obstruction. Inflammatory bowel disease cannot be excluded. Nonspecific infectious or inflammatory enteritis cannot be excluded. There is some narrowing of the lumen of terminal ileum and fatty thickening of the wall without definite evidence of small bowel obstruction. Nonspecific mild thickening of the wall of the cecum which may be seen dorsal and inflammatory bowel disease. No definite acute colitis. There are coarse calcification in right  paracolic gutter mesentery. There are linear calcification in mid mesenteric within pelvis. Coarse calcifications are noted in left anterior pelvic mesentery. Findings may be related to chronic peritoneal dialysis or sequela from prior peritoneal hemorrhage. Less likely peritoneal neoplastic process. No gastric outlet obstruction. The colon is empty partially collapsed. There is diffuse colonic diverticulosis. No definite evidence of acute colitis or diverticulitis. Axial image 61 mild saccular dilatation of distal small bowel in mid pelvis. No evidence of small bowel perforation. Vascular/Lymphatic: Extensive atherosclerotic calcifications of abdominal aorta and iliac arteries are noted. No adenopathy. Reproductive: Vascular calcifications are noted bilateral adnexal region. The uterus is anteflexed. Probable hemorrhagic follicle within left ovary measures 1.2 cm. Other: There is small amount of pelvic ascites in right pelvis and right lower paracolic gutter. No evidence of free abdominal air. 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 There is mild compression deformity upper endplate of QA348G, 624THL and T12 vertebral bodies of indeterminate age. No bony protrusion in spinal canal. IMPRESSION: 1. There is abnormal dilatation of small bowel in mid abdomen mid pelvis and right lower quadrant with few air-fluid levels. Mild enhancement of the small bowel wall. There is small amount of adjacent ascites. Mild focal saccular dilatation of distal small bowel in axial image 53. There is some thickening of terminal ileal wall. There is some thickening of cecal wall. Findings are highly suspicious for small bowel enteritis, inflammatory bowel disease, infectious enteritis or less likely partial small bowel obstruction. Mild inflammatory changes in terminal ileum and cecum. The appendix is not identified. 2. Diffuse colonic diverticulosis. No  evidence of acute diverticulitis. 3. There are multiple coarse peritoneal calcifications. This may be related to peritoneal dialysis or sequela from prior peritoneal hemorrhage less likely peritoneal neoplastic process. 4. There is no evidence of acute pancreatitis. Stable diameter of main pancreatic duct measures 3.4 mm. 5. Again noted atrophic kidneys with multiple cysts probable due to polycystic kidney disease. There is absent excretion bilateral renal collecting system and bilateral ureter on delayed images. 6. Extensive atherosclerotic calcifications of abdominal aorta and iliac arteries. No aortic aneurysm. 7. Diffuse sclerotic bone within spine and pelvis suspicious for chronic renal osteodystrophy. Mild compression deformity upper endplate of QA348G, 624THL and T12 vertebral bodies of indeterminate age. No bony protrusion in spinal canal Electronically Signed   By: Lahoma Crocker M.D.   On: 03/24/2015 12:10   I have personally reviewed and evaluated these images and lab results as part of my medical decision-making.   EKG Interpretation   Date/Time:  Tuesday March 24 2015 09:30:22 EST Ventricular Rate:  79 PR Interval:  163 QRS Duration: 72 QT Interval:  383 QTC Calculation: 439 R Axis:   90 Text Interpretation:  Normal sinus rhythm No further analysis  attempted  due to paced rhythm Confirmed by Wyvonnia Dusky  MD, [place] [phone]) on 03/24/2015  9:43:59 AM      MDM   Final diagnoses:  Enteritis    Filed Vitals:   03/24/15 0815 03/24/15 0830 03/24/15 0845 03/24/15 0900  BP: 133/76 140/80 154/86 156/74  Pulse: 69 77 78 88  Temp:      TempSrc:      Resp:      Height:      Weight:      SpO2: 100% 100% 100% 100%    Medications  ondansetron (ZOFRAN) injection 4 mg (0 mg Intravenous Hold 03/24/15 0700)  HYDROmorphone (DILAUDID) injection 0.5 mg (not administered)  ondansetron (ZOFRAN) injection 4 mg (4 mg Intravenous Given 03/24/15 0642)  morphine 4 MG/ML injection 4 mg (4 mg Intravenous Given  03/24/15 0716)  sodium chloride 0.9 % bolus 250 mL (0 mLs Intravenous Stopped 03/24/15 0935)  morphine 4 MG/ML injection 4 mg (4 mg Intravenous Given 03/24/15 0929)  iohexol (OMNIPAQUE) 300 MG/ML solution 80 mL (80 mLs Intravenous Contrast Given 03/24/15 1100)    [name] is 55 y.o. female presenting withEpigastric abdominal pain, multiple episodes of vomitus and diarrhea consistent with prior episodes of pancreatitis. Patient is afebrile, well-appearing with nonsurgical abdominal exam. Blood work reassuring with no leukocytosis or significant electrolyte abnormality. She is due for dialysis today. Potassium within normal limits. We'll give small bolus of 250 mL saline.  Patients pain has improved, she is tolerating by mouth's. She called her dialysis center and they can accommodate her later in the afternoon. Repeat abdominal exam remains benign. Extensive discussion of return precautions and patient verbalizes her understanding. Advised her to have a clear liquid diet until the pain passes.  Tending physician has evaluated this patient and based on the severity of her pain thinks that CT is warranted.  CT with diffuse enteritis, question partial small bowel obstruction. Given the severity of her pain and abnormalities on CT will need observation admission. Case discussed with [facility] hospitalist who accepts observation admission to [place] bed.  Case discussed with nephrologist Dr. Henrietta Dine who will evaluate the patient and arrange for hemodialysis on the floor.     Monico Blitz, PA-C 03/24/15 [place], MD 03/24/15 Pauline Aus

## 2015-03-24 NOTE — ED Notes (Signed)
Pa pisciotta at bedside

## 2015-03-24 NOTE — ED Notes (Signed)
MD at bedside. 

## 2015-03-24 NOTE — Consult Note (Signed)
[name] Renal Consultation Note    Indication for Consultation:  Management of ESRD/hemodialysis; anemia, hypertension/volume and secondary hyperparathyroidism PCP: Hoyt Koch, MD   HPI: [name] is a 55 y.o. female with ESRD secondary to HTN, prior failed kidney transplant, history of recurrent idiopathic pancreatitis on TTS HD at [facility] who presented to the ED early this am after the acute of N, V, abdominal pain last evening around 11 pm after eating bran flakes and cabbage.  She had diarrhea as weel.  She continues to have LUQ pain. No fever, chills, SOB, CP.  No problems with dialysis lately and recently had EDW raised. Abdominal CT showed small bowel thickening and diffuse diverticulosis.  She is due for HD today. Chemistries are unremarkable. WBC wnl and lipase 44.  Past Medical History  Diagnosis Date  . HTN (hypertension)   . Pancreatitis   . Anxiety   . Anemia   . Claustrophobia   . ESRD (end stage renal disease) on dialysis ([place])     "TTS; [place]" (05/12/2014)  . Glomerulonephritis   . Secondary hyperparathyroidism ([place])     [name] 05/11/2014  . Renal insufficiency    Past Surgical History  Procedure Laterality Date  . Kidney transplant  1997  . Arteriovenous graft placement Right 1990's?  . Dilatation & currettage/hysteroscopy with resectocope N/A 04/17/2013    Procedure: DILATATION & CURETTAGE/HYSTEROSCOPY WITH RESECTOCOPE;  Surgeon: Marvene Staff, MD;  Location: [facility];  Service: Gynecology;  Laterality: N/A;  . Colonoscopy    . Peritoneal catheter insertion    . Parathyroidectomy  03/2000 05/12/2014    w/neck exploration & autotransplantation/notes 06/02/2010; w/neck exploration  . Dilation and curettage of uterus    . Peritoneal catheter removal  08/1999    [name] 06/02/2010  . Breast biopsy Left   . Thrombectomy and revision of arterioventous (av) goretex  graft Right 01/2002; 11/2003; 01/2004; 08/07/2004; 08/10/2004;  11/13/2005    [name] 5/1/2012Marland Kitchen [name] 5/16/2012Marland Kitchen [name] 5/16/2012Marland Kitchen [name] 5/16/2012Marland Kitchen [name] 06/02/2010; [name] 06/02/2010  . Av fistula placement Left 09/2000    upper arm/notes 06/02/2010  . Thrombectomy Left 02/2002    fistula/notes 06/02/2010  . Arteriovenous graft placement Left 07/2002    upper arm/notes 06/02/2010  . Thrombectomy and revision of arterioventous (av) goretex  graft Left 08/23/2002; 09/13/2002; 07/08/2003    [name] 5/16/2012Marland Kitchen [name] 5/16/2012Marland Kitchen [name] 06/02/2010  . Parathyroidectomy N/A 05/12/2014    Procedure: PARATHYROIDECTOMY AND NECK EXPLORATION;  Surgeon: Armandina Gemma, MD;  Location: [facility];  Service: General;  Laterality: N/A;   Family History  Problem Relation Age of Onset  . Adopted: Yes  . Diabetes Maternal Aunt     x2  . Diabetes Maternal Uncle    Social History:  reports that she has been smoking Cigarettes.  She has a 20 pack-year smoking history. She has never used smokeless tobacco. She reports that she does not drink alcohol or use illicit drugs. Allergies  Allergen Reactions  . Ace Inhibitors Hives and Nausea And Vomiting  . Codeine Hives  . Penicillins Hives    Medication Sig Start Date End Date Taking? Authorizing Provider  levothyroxine (SYNTHROID, LEVOTHROID) 50 MCG tablet Take 50 mcg by mouth daily as needed (as instructed by MD).  07/23/14  Yes Historical Provider, MD  metoprolol tartrate (LOPRESSOR) 25 MG tablet Take 25 mg by mouth daily as needed (as instructed by MD).    Yes Historical Provider, MD  ondansetron (ZOFRAN ODT) 4 MG disintegrating tablet Take 1  tablet (4 mg total) by mouth every 8 (eight) hours as needed for nausea. 11/26/14  Yes Larene Pickett, PA-C  ondansetron (ZOFRAN) 4 MG tablet Take 1 tablet (4 mg total) by mouth every 8 (eight) hours as needed for nausea or vomiting. 03/24/15   Nicole Pisciotta, PA-C  oxyCODONE-acetaminophen (PERCOCET/ROXICET) 5-325 MG tablet 1 to 2 tabs PO q6hrs  PRN for pain 03/24/15   Monico Blitz, PA-C   Current  Facility-Administered Medications  Medication Dose Route Frequency Provider Last Rate Last Dose  . ondansetron (ZOFRAN) injection 4 mg  4 mg Intravenous Once [employer], PA-C   Stopped at 03/24/15 0700   Current Outpatient Prescriptions  Medication Sig Dispense Refill  . levothyroxine (SYNTHROID, LEVOTHROID) 50 MCG tablet Take 50 mcg by mouth daily as needed (as instructed by MD).     Marland Kitchen metoprolol tartrate (LOPRESSOR) 25 MG tablet Take 25 mg by mouth daily as needed (as instructed by MD).     Marland Kitchen ondansetron (ZOFRAN ODT) 4 MG disintegrating tablet Take 1 tablet (4 mg total) by mouth every 8 (eight) hours as needed for nausea. 10 tablet 0  . ondansetron (ZOFRAN) 4 MG tablet Take 1 tablet (4 mg total) by mouth every 8 (eight) hours as needed for nausea or vomiting. 10 tablet 0  . oxyCODONE-acetaminophen (PERCOCET/ROXICET) 5-325 MG tablet 1 to 2 tabs PO q6hrs  PRN for pain 15 tablet 0   Labs: Basic Metabolic Panel:  Recent Labs Lab 03/24/15 0356  NA 137  K 3.9  CL 98*  CO2 21*  GLUCOSE 98  BUN 35*  CREATININE 11.58*  CALCIUM 10.1   Liver Function Tests:  Recent Labs Lab 03/24/15 0356  AST 16  ALT 6*  ALKPHOS 41  BILITOT 0.4  PROT 7.0  ALBUMIN 3.5    Recent Labs Lab 03/24/15 0356  LIPASE 44   CBC:  Recent Labs Lab 03/24/15 0356  WBC 8.6  HGB 12.6  HCT 37.5  MCV 91.2  PLT 205   Cardiac Enzymes:  Recent Labs Lab 03/24/15 0902  TROPONINI <0.03   Studies/Results: Ct Abdomen Pelvis W Contrast  03/24/2015  CLINICAL DATA:  Upper abdominal pain starting last night 11 p.m., history of chronic pancreatitis EXAM: CT ABDOMEN AND PELVIS WITH CONTRAST TECHNIQUE: Multidetector CT imaging of the abdomen and pelvis was performed using the standard protocol following bolus administration of intravenous contrast. CONTRAST:  66mL OMNIPAQUE IOHEXOL 300 MG/ML  SOLN COMPARISON:  CT scan 09/11/2005 FINDINGS: Lower chest:  Lung bases are unremarkable. Hepatobiliary: No focal  hepatic mass. No intrahepatic biliary ductal dilatation. Pancreas: No evidence of acute pancreatitis. Main pancreatic duct measures 3.4 mm in diameter without significant change from prior exam. No focal pancreatic mass. Spleen: No focal splenic mass. There are sub- diaphragmatic calcification just above the posterior aspect of splenic capsule. Adrenals/Urinary Tract: Again noted atrophic kidney with multiple renal cysts consistent with adult polycystic kidney disease. Punctate nonobstructive bilateral renal calcifications. No hydronephrosis or hydroureter. No adrenal gland mass is noted. Delayed renal images shows absent excretion bilateral renal collecting system and bilateral ureter. Stomach/Bowel: There are multiple fluid distended in mid abdomen mid pelvis and right lower quadrant. There is some enhancement of the small bowel wall axial image 41 mid anterior abdomen. Findings are highly suspicious for segmental small bowel enteritis, less likely partial small bowel obstruction. Inflammatory bowel disease cannot be excluded. Nonspecific infectious or inflammatory enteritis cannot be excluded. There is some narrowing of the lumen of terminal ileum and fatty thickening  of the wall without definite evidence of small bowel obstruction. Nonspecific mild thickening of the wall of the cecum which may be seen dorsal and inflammatory bowel disease. No definite acute colitis. There are coarse calcification in right paracolic gutter mesentery. There are linear calcification in mid mesenteric within pelvis. Coarse calcifications are noted in left anterior pelvic mesentery. Findings may be related to chronic peritoneal dialysis or sequela from prior peritoneal hemorrhage. Less likely peritoneal neoplastic process. No gastric outlet obstruction. The colon is empty partially collapsed. There is diffuse colonic diverticulosis. No definite evidence of acute colitis or diverticulitis. Axial image 61 mild saccular dilatation of  distal small bowel in mid pelvis. No evidence of small bowel perforation. Vascular/Lymphatic: Extensive atherosclerotic calcifications of abdominal aorta and iliac arteries are noted. No adenopathy. Reproductive: Vascular calcifications are noted bilateral adnexal region. The uterus is anteflexed. Probable hemorrhagic follicle within left ovary measures 1.2 cm. Other: There is small amount of pelvic ascites in right pelvis and right lower paracolic gutter. No evidence of free abdominal air. 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 There is mild compression deformity upper endplate of QA348G, 624THL and T12 vertebral bodies of indeterminate age. No bony protrusion in spinal canal. IMPRESSION: 1. There is abnormal dilatation of small bowel in mid abdomen mid pelvis and right lower quadrant with few air-fluid levels. Mild enhancement of the small bowel wall. There is small amount of adjacent ascites. Mild focal saccular dilatation of distal small bowel in axial image 53. There is some thickening of terminal ileal wall. There is some thickening of cecal wall. Findings are highly suspicious for small bowel enteritis, inflammatory bowel disease, infectious enteritis or less likely partial small bowel obstruction. Mild inflammatory changes in terminal ileum and cecum. The appendix is not identified. 2. Diffuse colonic diverticulosis. No evidence of acute diverticulitis. 3. There are multiple coarse peritoneal calcifications. This may be related to peritoneal dialysis or sequela from prior peritoneal hemorrhage less likely peritoneal neoplastic process. 4. There is no evidence of acute pancreatitis. Stable diameter of main pancreatic duct measures 3.4 mm. 5. Again noted atrophic kidneys with multiple cysts probable due to polycystic kidney disease. There is absent excretion bilateral renal collecting system and bilateral ureter on delayed images.  6. Extensive atherosclerotic calcifications of abdominal aorta and iliac arteries. No aortic aneurysm. 7. Diffuse sclerotic bone within spine and pelvis suspicious for chronic renal osteodystrophy. Mild compression deformity upper endplate of QA348G, 624THL and T12 vertebral bodies of indeterminate age. No bony protrusion in spinal canal Electronically Signed   By: Lahoma Crocker M.D.   On: 03/24/2015 12:10    ROS: As per HPI otherwise negative.  Physical Exam: Filed Vitals:   03/24/15 1315 03/24/15 1330 03/24/15 1345 03/24/15 1400  BP: 152/88 162/86 162/87 175/94  Pulse: 77 82 80 78  Temp:      TempSrc:      Resp: 16 21 23 18   Height:      Weight:      SpO2: 100% 100% 100% 100%     General: slender AAF well developed, well nourished, in no acute distress., faint odor of cigarette smoke Head: Normocephalic, atraumatic, sclera non-icteric, mucus membranes are moist Neck: Supple. JVD not elevated. Lungs: Clear bilaterally to auscultation without wheezes, rales, or rhonchi. Breathing is unlabored. Heart: RRR 2/6 murmur Abdomen: Soft, LUQ tenderness + BS M-S:  Strength and tone appear normal for age. Lower extremities:  1+ LE edema or ischemic  changes, no open wounds  Neuro: Alert and oriented X 3. Moves all extremities spontaneously. Psych:  Responds to questions appropriately with a normal affect. Dialysis Access:right upper AVGG + bruit  Dialysis Orders: [facility] 3.5 hr 450/A 1.5 EDW 62.5 just ^ 2 K 3.5 Ca right upper AVGG heparin 5200 hectorol 4 Mircera 50 q 6 weeks - last 1/31 average gains 1.5 - 3 L recent labs: hgb 11.5 55% sat iPTH 386 coming down corr Ca 9.6 P 5.8  H- had right thyroid lobectomy with parathyroid removed 04/2014 for evaluation for recurrent secondary hyperparathyroidism *iPTH 3200) - had previous parathyroidectomy Dr. Leafy Kindle 15 - 20 years prior  Assessment/Plan: 1.  Abdominal pain ,N, V ? enteritis vs partial SBO- conservative measures 2.  ESRD -  TTS - HD today to keep on  schedule 3.  Hypertension/volume  - on norvasc 5 mg on non HD days - BP comes down post HD - given IVF - no need for supplemental fluids at present 4.  Anemia  - hgb 12.6 no ESA 5.  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  PTH remains elevated but  controlled with hectorol 4 q HD.  P borderline on tums - change to 2.5 Ca bath for now and probably at discharge; using 4 K bath today which has 2.25 bath - not taking tums due to GI issues - repeat labs in am. 6.  Nutrition - sips CL as tol 7. Hx tachycardia - previously on BB - none at present 8. Tobacco abuse -  9. S/p right thyroid lobectomy 04/2014 for recurrent secondary hyperparathyroidism - on synthroid short term; off now - TSH to be rechecked  Myriam Jacobson, PA-C [place] [phone] 03/24/2015, 2:29 PM   Pt seen, examined and agree w A/P as above.  Kelly Splinter MD [employer] pager [phone]    cell [phone] 03/24/2015, 4:52 PM

## 2015-03-24 NOTE — ED Notes (Signed)
Patient here with complaint of abdominal pain. States onset last night at 2300. Endorses history of chronic pancreatitis. States this pain feels similar to previous flares of pancreatitis. Also states vomiting and nausea, denies fever. ESRD present; usual schedule is tues, thurs, sat; patient denies missing appointments.

## 2015-03-24 NOTE — ED Notes (Signed)
Patient transported to X-ray 

## 2015-03-24 NOTE — H&P (Signed)
[facility] History and Physical  [name] [id number] DOB: 1960-03-18 DOA: 03/24/2015  Referring physician: Emergency Department PCP: Hoyt Koch, MD   CHIEF COMPLAINT:                   HPI: [name] is a 55 y.o. female with HTN, thyroid disease , and ESRD on HD Tues/Thur/Sat. Missed today's treatment. Patient in ED with nausea / vomiting / abdominal pain worse. Her symptoms started around 11 PM last night and were accompanied by acute diarrhea. Due to lingering symptoms patient presented to the emergency department around 3 this morning. No diarrhea today. No further nausea and vomiting but she has persistent left upper quadrant pain with any by mouth intake. No sick contacts. She felt completely fine prior to 11 PM last night         ED COURSE:           Labs:   Lipase normal, normal LFTs, normal CBC   EKG:    A-V dual-paced complexes w/ some inhibition No further analysis attempted due to paced rhythm                   Medications  ondansetron (ZOFRAN) injection 4 mg (0 mg Intravenous Hold 03/24/15 0700)  ondansetron (ZOFRAN) injection 4 mg (4 mg Intravenous Given 03/24/15 0642)  morphine 4 MG/ML injection 4 mg (4 mg Intravenous Given 03/24/15 0716)  sodium chloride 0.9 % bolus 250 mL (0 mLs Intravenous Stopped 03/24/15 0935)  morphine 4 MG/ML injection 4 mg (4 mg Intravenous Given 03/24/15 0929)  iohexol (OMNIPAQUE) 300 MG/ML solution 80 mL (80 mLs Intravenous Contrast Given 03/24/15 1100)    Review of Systems  Constitutional: Negative.   HENT: Negative.   Eyes: Negative.   Respiratory: Negative.   Cardiovascular: Negative.   Gastrointestinal: Positive for nausea, vomiting, abdominal pain and diarrhea.  Genitourinary: Negative.   Musculoskeletal: Negative.   Skin: Negative.   Neurological: Negative.   Endo/Heme/Allergies: Negative.   Psychiatric/Behavioral: Negative.     Past Medical History  Diagnosis Date  . HTN (hypertension)   . Pancreatitis   .  Anxiety   . Anemia   . Claustrophobia   . ESRD (end stage renal disease) on dialysis ([place])     "TTS; [facility]" (05/12/2014)  . Glomerulonephritis   . Secondary hyperparathyroidism ([place])     [name] 05/11/2014  . Renal insufficiency    Past Surgical History  Procedure Laterality Date  . Kidney transplant  1997  . Arteriovenous graft placement Right 1990's?  . Dilatation & currettage/hysteroscopy with resectocope N/A 04/17/2013    Procedure: DILATATION & CURETTAGE/HYSTEROSCOPY WITH RESECTOCOPE;  Surgeon: Marvene Staff, MD;  Location: [facility];  Service: Gynecology;  Laterality: N/A;  . Colonoscopy    . Peritoneal catheter insertion    . Parathyroidectomy  03/2000 05/12/2014    w/neck exploration & autotransplantation/notes 06/02/2010; w/neck exploration  . Dilation and curettage of uterus    . Peritoneal catheter removal  08/1999    [name] 06/02/2010  . Breast biopsy Left   . Thrombectomy and revision of arterioventous (av) goretex  graft Right 01/2002; 11/2003; 01/2004; 08/07/2004; 08/10/2004; 11/13/2005    [name] 5/1/2012Marland Kitchen [name] 5/16/2012Marland Kitchen [name] 5/16/2012Marland Kitchen [name] 5/16/2012Marland Kitchen [name] 06/02/2010; [name] 06/02/2010  . Av fistula placement Left 09/2000    upper arm/notes 06/02/2010  . Thrombectomy Left 02/2002    fistula/notes 06/02/2010  . Arteriovenous graft placement Left 07/2002    upper arm/notes 06/02/2010  .  Thrombectomy and revision of arterioventous (av) goretex  graft Left 08/23/2002; 09/13/2002; 07/08/2003    [name] 5/16/2012Marland Kitchen [name] 5/16/2012Marland Kitchen [name] 06/02/2010  . Parathyroidectomy N/A 05/12/2014    Procedure: PARATHYROIDECTOMY AND NECK EXPLORATION;  Surgeon: Armandina Gemma, MD;  Location: [facility];  Service: General;  Laterality: N/A;    SOCIAL HISTORY:  reports that she has been smoking Cigarettes.  She has a 20 pack-year smoking history. She has never used smokeless tobacco. She reports that she does not drink alcohol or use illicit drugs. Lives:   With mother   Assistive  devices:   None needed for ambulation.   Allergies  Allergen Reactions  . Ace Inhibitors Hives and Nausea And Vomiting  . Codeine Hives    Family History  Problem Relation Age of Onset  . Adopted: Yes  . Diabetes Maternal Aunt     x2  . Diabetes Maternal Uncle      Prior to Admission medications  none   PHYSICAL EXAM: Filed Vitals:   03/24/15 0815 03/24/15 0830 03/24/15 0845 03/24/15 0900  BP: 133/76 140/80 154/86 156/74  Pulse: 69 77 78 88  Temp:      TempSrc:      Resp:      Height:      Weight:      SpO2: 100% 100% 100% 100%    Wt Readings from Last 3 Encounters:  03/24/15 62 kg (136 lb 11 oz)  11/21/14 61.689 kg (136 lb)  05/16/14 63.5 kg (139 lb 15.9 oz)    General: Pleasant, thin black female female. Appears calm and comfortable Eyes: PER, normal lids, irises & conjunctiva ENT: grossly normal hearing, lips & tongue Neck: no LAD, no masses Cardiovascular: RRR, no murmurs, 1-2+ BLE edema.  Respiratory: Respirations even and unlabored. Normal respiratory effort. Lungs CTA bilaterally, no wheezes / rales .   Abdomen: soft, non-distended, mild LUQ tenderness, active bowel sounds. No obvious masses.  Skin: no rash seen on limited exam Musculoskeletal: grossly normal tone BUE/BLE Psychiatric: grossly normal mood and affect, speech fluent and appropriate Neurologic: grossly non-focal.         LABS ON ADMISSION:    Basic Metabolic Panel:  Recent Labs Lab 03/24/15 0356  NA 137  K 3.9  CL 98*  CO2 21*  GLUCOSE 98  BUN 35*  CREATININE 11.58*  CALCIUM 10.1   Liver Function Tests:  Recent Labs Lab 03/24/15 0356  AST 16  ALT 6*  ALKPHOS 41  BILITOT 0.4  PROT 7.0  ALBUMIN 3.5    CBC:  Recent Labs Lab 03/24/15 0356  WBC 8.6  HGB 12.6  HCT 37.5  MCV 91.2  PLT 205    CREATININE: 11.58 mg/dL ABNORMAL (03/24/15 0356) Estimated creatinine clearance - 5.4 mL/min  Radiological Exams on Admission: Ct Abdomen Pelvis W Contrast  03/24/2015   CLINICAL DATA:  Upper abdominal pain starting last night 11 p.m., history of chronic pancreatitis EXAM: CT ABDOMEN AND PELVIS WITH CONTRAST TECHNIQUE: Multidetector CT imaging of the abdomen and pelvis was performed using the standard protocol following bolus administration of intravenous contrast. CONTRAST:  14mL OMNIPAQUE IOHEXOL 300 MG/ML  SOLN COMPARISON:  CT scan 09/11/2005 FINDINGS: Lower chest:  Lung bases are unremarkable. Hepatobiliary: No focal hepatic mass. No intrahepatic biliary ductal dilatation. Pancreas: No evidence of acute pancreatitis. Main pancreatic duct measures 3.4 mm in diameter without significant change from prior exam. No focal pancreatic mass. Spleen: No focal splenic mass. There are sub- diaphragmatic calcification just above the posterior aspect  of splenic capsule. Adrenals/Urinary Tract: Again noted atrophic kidney with multiple renal cysts consistent with adult polycystic kidney disease. Punctate nonobstructive bilateral renal calcifications. No hydronephrosis or hydroureter. No adrenal gland mass is noted. Delayed renal images shows absent excretion bilateral renal collecting system and bilateral ureter. Stomach/Bowel: There are multiple fluid distended in mid abdomen mid pelvis and right lower quadrant. There is some enhancement of the small bowel wall axial image 41 mid anterior abdomen. Findings are highly suspicious for segmental small bowel enteritis, less likely partial small bowel obstruction. Inflammatory bowel disease cannot be excluded. Nonspecific infectious or inflammatory enteritis cannot be excluded. There is some narrowing of the lumen of terminal ileum and fatty thickening of the wall without definite evidence of small bowel obstruction. Nonspecific mild thickening of the wall of the cecum which may be seen dorsal and inflammatory bowel disease. No definite acute colitis. There are coarse calcification in right paracolic gutter mesentery. There are linear  calcification in mid mesenteric within pelvis. Coarse calcifications are noted in left anterior pelvic mesentery. Findings may be related to chronic peritoneal dialysis or sequela from prior peritoneal hemorrhage. Less likely peritoneal neoplastic process. No gastric outlet obstruction. The colon is empty partially collapsed. There is diffuse colonic diverticulosis. No definite evidence of acute colitis or diverticulitis. Axial image 61 mild saccular dilatation of distal small bowel in mid pelvis. No evidence of small bowel perforation. Vascular/Lymphatic: Extensive atherosclerotic calcifications of abdominal aorta and iliac arteries are noted. No adenopathy. Reproductive: Vascular calcifications are noted bilateral adnexal region. The uterus is anteflexed. Probable hemorrhagic follicle within left ovary measures 1.2 cm. Other: There is small amount of pelvic ascites in right pelvis and right lower paracolic gutter. No evidence of free abdominal air. 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 There is mild compression deformity upper endplate of QA348G, 624THL and T12 vertebral bodies of indeterminate age. No bony protrusion in spinal canal. IMPRESSION: 1. There is abnormal dilatation of small bowel in mid abdomen mid pelvis and right lower quadrant with few air-fluid levels. Mild enhancement of the small bowel wall. There is small amount of adjacent ascites. Mild focal saccular dilatation of distal small bowel in axial image 53. There is some thickening of terminal ileal wall. There is some thickening of cecal wall. Findings are highly suspicious for small bowel enteritis, inflammatory bowel disease, infectious enteritis or less likely partial small bowel obstruction. Mild inflammatory changes in terminal ileum and cecum. The appendix is not identified. 2. Diffuse colonic diverticulosis. No evidence of acute diverticulitis. 3. There are  multiple coarse peritoneal calcifications. This may be related to peritoneal dialysis or sequela from prior peritoneal hemorrhage less likely peritoneal neoplastic process. 4. There is no evidence of acute pancreatitis. Stable diameter of main pancreatic duct measures 3.4 mm. 5. Again noted atrophic kidneys with multiple cysts probable due to polycystic kidney disease. There is absent excretion bilateral renal collecting system and bilateral ureter on delayed images. 6. Extensive atherosclerotic calcifications of abdominal aorta and iliac arteries. No aortic aneurysm. 7. Diffuse sclerotic bone within spine and pelvis suspicious for chronic renal osteodystrophy. Mild compression deformity upper endplate of QA348G, 624THL and T12 vertebral bodies of indeterminate age. No bony protrusion in spinal canal Electronically Signed   By: Lahoma Crocker M.D.   On: 03/24/2015 12:10     ASSESSMENT / PLAN   Abdominal pain / nausea / vomiting [place]. CT scan with contrast suggests enteritis with possible partial SBO. Suspect  she has infectious enteritis given the acute onset. N/V/D have resolved but still unable to take PO secondary to abdominal pain.  -Admit to Medical Bed -Gentle IV hydration. Got 284ml bolus in ED. Start 50ml/hr now. -IV pain medications. Allergy to Codeine. Tolerated Morphine in ED -IV analgesics -sips of clears (as tolerated) -If no improvement consider GI consult in am   ESRD on dialysis Tu / Th / Sat . Missed treatment today.  -EDP has nofied Nephrology.   Hypothyroidism. Hormone replacement stopped almost 1 year ago. -Check TSH  History of tachycardia. Was on Lopressor but per patient all BP meds discontinued secondary to hypotension .   Tobacco abuse. Would like to quit. Nicotine patches make her sick.  -Smoking cessation per RN  CONSULTANTS:    Nephrology  Code Status: full2 DVT Prophylaxis: Heparin Family Communication:  Patient alert, oriented and understands plan of care.    Disposition Plan: Discharge to home in 24-48 hours   Time spent: 60 minutes Tye Savoy  NP [facility] Pager [phone]

## 2015-03-25 DIAGNOSIS — N186 End stage renal disease: Secondary | ICD-10-CM

## 2015-03-25 DIAGNOSIS — K529 Noninfective gastroenteritis and colitis, unspecified: Principal | ICD-10-CM

## 2015-03-25 DIAGNOSIS — Z992 Dependence on renal dialysis: Secondary | ICD-10-CM

## 2015-03-25 DIAGNOSIS — E039 Hypothyroidism, unspecified: Secondary | ICD-10-CM

## 2015-03-25 LAB — CBC
HEMATOCRIT: 35.8 % — AB (ref 36.0–46.0)
Hemoglobin: 12.3 g/dL (ref 12.0–15.0)
MCH: 31.3 pg (ref 26.0–34.0)
MCHC: 34.4 g/dL (ref 30.0–36.0)
MCV: 91.1 fL (ref 78.0–100.0)
Platelets: 155 10*3/uL (ref 150–400)
RBC: 3.93 MIL/uL (ref 3.87–5.11)
RDW: 13.5 % (ref 11.5–15.5)
WBC: 5.9 10*3/uL (ref 4.0–10.5)

## 2015-03-25 LAB — BASIC METABOLIC PANEL
ANION GAP: 11 (ref 5–15)
BUN: 12 mg/dL (ref 6–20)
CALCIUM: 8.7 mg/dL — AB (ref 8.9–10.3)
CO2: 25 mmol/L (ref 22–32)
Chloride: 101 mmol/L (ref 101–111)
Creatinine, Ser: 6.17 mg/dL — ABNORMAL HIGH (ref 0.44–1.00)
GFR, EST AFRICAN AMERICAN: 8 mL/min — AB (ref >60)
GFR, EST NON AFRICAN AMERICAN: 7 mL/min — AB (ref >60)
GLUCOSE: 81 mg/dL (ref 65–99)
Potassium: 3.6 mmol/L (ref 3.5–5.1)
SODIUM: 137 mmol/L (ref 135–145)

## 2015-03-25 LAB — TSH: TSH: 5.058 u[IU]/mL — AB (ref 0.350–4.500)

## 2015-03-25 MED ORDER — LEVOTHYROXINE SODIUM 50 MCG PO TABS
50.0000 ug | ORAL_TABLET | Freq: Every day | ORAL | Status: DC
  Administered 2015-03-25: 50 ug via ORAL
  Filled 2015-03-25: qty 1

## 2015-03-25 MED ORDER — MORPHINE SULFATE (PF) 2 MG/ML IV SOLN
INTRAVENOUS | Status: AC
  Filled 2015-03-25: qty 1

## 2015-03-25 MED ORDER — METRONIDAZOLE 500 MG PO TABS
500.0000 mg | ORAL_TABLET | Freq: Three times a day (TID) | ORAL | Status: DC
  Administered 2015-03-25 (×3): 500 mg via ORAL
  Filled 2015-03-25 (×3): qty 1

## 2015-03-25 MED ORDER — CIPROFLOXACIN HCL 500 MG PO TABS
500.0000 mg | ORAL_TABLET | Freq: Every day | ORAL | Status: DC
  Administered 2015-03-25 – 2015-03-27 (×3): 500 mg via ORAL
  Filled 2015-03-25 (×4): qty 1

## 2015-03-25 NOTE — Progress Notes (Signed)
[facility] PROGRESS NOTE  [name] [id number] DOB: 12/04/1960 DOA: 03/24/2015  PCP: Hoyt Koch, MD  Brief HPI: 55 year old African-American female with past medical history of hypertension, hypothyroidism, end-stage renal disease on hemodialysis, presented with complaints of nausea, vomiting, diarrhea and abdominal pain. CT scan revealed findings suggestive of enteritis. Patient was hospitalized for further management.  Past medical history:  Past Medical History  Diagnosis Date  . HTN (hypertension)   . Pancreatitis   . Anxiety   . Anemia   . Claustrophobia   . Secondary hyperparathyroidism ([place])     [name] 05/11/2014  . Heart murmur   . Hypothyroidism   . ESRD (end stage renal disease) on dialysis ([place])     "TTS; [facility]" (03/24/2015)  . Glomerulonephritis   . Renal insufficiency     Consultants: Nephrology  Procedures: Hemodialysis 3/7  Antibiotics: Cipro and Flagyl 3/8  Subjective: Overall, patient feels a little better this morning. Continues to have some abdominal discomfort at times. Last episode of diarrhea was yesterday. Nauseated at times and reports poor appetite, however, no more vomiting. She was dialyzed yesterday evening.  Objective: Vital Signs  Filed Vitals:   03/25/15 0229 03/25/15 0256 03/25/15 0543 03/25/15 0727  BP: 117/78 128/73 120/66 99/68  Pulse: 89 88 92 100  Temp:  98.8 F (37.1 C) 98.8 F (37.1 C) 99.5 F (37.5 C)  TempSrc:  Oral Oral Oral  Resp: 15 16 18 18   Height:      Weight:  62.3 kg (137 lb 5.6 oz)    SpO2:  100% 99% 97%    Intake/Output Summary (Last 24 hours) at 03/25/15 0855 Last data filed at 03/25/15 0256  Gross per 24 hour  Intake 363.33 ml  Output   2902 ml  Net -2538.67 ml   Filed Weights   03/24/15 0348 03/24/15 2315 03/25/15 0256  Weight: 62 kg (136 lb 11 oz) 65.4 kg (144 lb 2.9 oz) 62.3 kg (137 lb 5.6 oz)    General appearance: alert, cooperative, appears stated age  and no distress Resp: clear to auscultation bilaterally Cardio: regular rate and rhythm, S1, S2 normal, no murmur, click, rub or gallop GI: Abdomen soft. Mildly tender diffusely without any rebound, rigidity or guarding. No masses or organomegaly. Bowel sounds are present. Extremities: extremities normal, atraumatic, no cyanosis or edema Neurologic: Awake and alert. Oriented 3. No focal neurological deficits  Lab Results:  Basic Metabolic Panel:  Recent Labs Lab 03/24/15 0356 03/25/15 0500  NA 137 137  K 3.9 3.6  CL 98* 101  CO2 21* 25  GLUCOSE 98 81  BUN 35* 12  CREATININE 11.58* 6.17*  CALCIUM 10.1 8.7*   Liver Function Tests:  Recent Labs Lab 03/24/15 0356  AST 16  ALT 6*  ALKPHOS 41  BILITOT 0.4  PROT 7.0  ALBUMIN 3.5    Recent Labs Lab 03/24/15 0356  LIPASE 44   CBC:  Recent Labs Lab 03/24/15 0356 03/25/15 0500  WBC 8.6 5.9  HGB 12.6 12.3  HCT 37.5 35.8*  MCV 91.2 91.1  PLT 205 155   Cardiac Enzymes:  Recent Labs Lab 03/24/15 0902  TROPONINI <0.03    Studies/Results: Ct Abdomen Pelvis W Contrast  03/24/2015  CLINICAL DATA:  Upper abdominal pain starting last night 11 p.m., history of chronic pancreatitis EXAM: CT ABDOMEN AND PELVIS WITH CONTRAST TECHNIQUE: Multidetector CT imaging of the abdomen and pelvis was performed using the standard protocol following bolus administration of intravenous  contrast. CONTRAST:  18mL OMNIPAQUE IOHEXOL 300 MG/ML  SOLN COMPARISON:  CT scan 09/11/2005 FINDINGS: Lower chest:  Lung bases are unremarkable. Hepatobiliary: No focal hepatic mass. No intrahepatic biliary ductal dilatation. Pancreas: No evidence of acute pancreatitis. Main pancreatic duct measures 3.4 mm in diameter without significant change from prior exam. No focal pancreatic mass. Spleen: No focal splenic mass. There are sub- diaphragmatic calcification just above the posterior aspect of splenic capsule. Adrenals/Urinary Tract: Again noted atrophic  kidney with multiple renal cysts consistent with adult polycystic kidney disease. Punctate nonobstructive bilateral renal calcifications. No hydronephrosis or hydroureter. No adrenal gland mass is noted. Delayed renal images shows absent excretion bilateral renal collecting system and bilateral ureter. Stomach/Bowel: There are multiple fluid distended in mid abdomen mid pelvis and right lower quadrant. There is some enhancement of the small bowel wall axial image 41 mid anterior abdomen. Findings are highly suspicious for segmental small bowel enteritis, less likely partial small bowel obstruction. Inflammatory bowel disease cannot be excluded. Nonspecific infectious or inflammatory enteritis cannot be excluded. There is some narrowing of the lumen of terminal ileum and fatty thickening of the wall without definite evidence of small bowel obstruction. Nonspecific mild thickening of the wall of the cecum which may be seen dorsal and inflammatory bowel disease. No definite acute colitis. There are coarse calcification in right paracolic gutter mesentery. There are linear calcification in mid mesenteric within pelvis. Coarse calcifications are noted in left anterior pelvic mesentery. Findings may be related to chronic peritoneal dialysis or sequela from prior peritoneal hemorrhage. Less likely peritoneal neoplastic process. No gastric outlet obstruction. The colon is empty partially collapsed. There is diffuse colonic diverticulosis. No definite evidence of acute colitis or diverticulitis. Axial image 61 mild saccular dilatation of distal small bowel in mid pelvis. No evidence of small bowel perforation. Vascular/Lymphatic: Extensive atherosclerotic calcifications of abdominal aorta and iliac arteries are noted. No adenopathy. Reproductive: Vascular calcifications are noted bilateral adnexal region. The uterus is anteflexed. Probable hemorrhagic follicle within left ovary measures 1.2 cm. Other: There is small amount  of pelvic ascites in right pelvis and right lower paracolic gutter. No evidence of free abdominal air. 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 There is mild compression deformity upper endplate of QA348G, 624THL and T12 vertebral bodies of indeterminate age. No bony protrusion in spinal canal. IMPRESSION: 1. There is abnormal dilatation of small bowel in mid abdomen mid pelvis and right lower quadrant with few air-fluid levels. Mild enhancement of the small bowel wall. There is small amount of adjacent ascites. Mild focal saccular dilatation of distal small bowel in axial image 53. There is some thickening of terminal ileal wall. There is some thickening of cecal wall. Findings are highly suspicious for small bowel enteritis, inflammatory bowel disease, infectious enteritis or less likely partial small bowel obstruction. Mild inflammatory changes in terminal ileum and cecum. The appendix is not identified. 2. Diffuse colonic diverticulosis. No evidence of acute diverticulitis. 3. There are multiple coarse peritoneal calcifications. This may be related to peritoneal dialysis or sequela from prior peritoneal hemorrhage less likely peritoneal neoplastic process. 4. There is no evidence of acute pancreatitis. Stable diameter of main pancreatic duct measures 3.4 mm. 5. Again noted atrophic kidneys with multiple cysts probable due to polycystic kidney disease. There is absent excretion bilateral renal collecting system and bilateral ureter on delayed images. 6. Extensive atherosclerotic calcifications of abdominal aorta and iliac arteries. No aortic aneurysm. 7. Diffuse sclerotic  bone within spine and pelvis suspicious for chronic renal osteodystrophy. Mild compression deformity upper endplate of QA348G, 624THL and T12 vertebral bodies of indeterminate age. No bony protrusion in spinal canal Electronically Signed   By: Lahoma Crocker M.D.   On:  03/24/2015 12:10    Medications:  Scheduled: . ciprofloxacin  500 mg Oral Q breakfast  . [START ON 03/26/2015] doxercalciferol  4 mcg Intravenous Q T,Th,Sa-HD  . heparin  5,000 Units Subcutaneous 3 times per day  . metroNIDAZOLE  500 mg Oral 3 times per day  . ondansetron (ZOFRAN) IV  4 mg Intravenous Once   Continuous:  [id number] **OR** acetaminophen, morphine injection, ondansetron **OR** ondansetron (ZOFRAN) IV  Assessment/Plan:  Principal Problem:   Abdominal pain Active Problems:   ESRD on dialysis ([facility])   Hypothyroidism   Nausea & vomiting   History of tachycardia   Enteritis   Uncontrollable vomiting    Abdominal pain / nausea / vomiting /diarrhea CT scan with contrast suggests enteritis with possible partial SBO. Suspect she has infectious enteritis given the acute onset. Symptoms appear to be resolving. Cipro and Flagyl. Symptomatic treatment. Advance to full liquids.   ESRD on dialysis Tu/Th/Sat Patient underwent dialysis 3/7. Nephrology is following.  Hypothyroidism Apparently hormone replacement stopped almost 1 year ago. TSH is 5.0. Reinitiate Synthroid for now. We will need to discuss this further with the patient.  History of tachycardia Was on Lopressor but per patient all BP meds discontinued secondary to hypotension  Tobacco abuse Would like to quit. Nicotine patches make her sick. Smoking cessation per RN.  DVT Prophylaxis: Subcutaneous heparin    Code Status: Full code  Family Communication: Discussed with the patient  Disposition Plan: Await further improvement. Anticipate discharge in 1-2 days.    LOS: 1 day   [place] Pager [phone] 03/25/2015, 8:55 AM  If 7PM-7AM, please contact night-coverage at [email or web address], password [facility]

## 2015-03-25 NOTE — Progress Notes (Signed)
  [facility] Progress Note   Subjective: got HD late last night.  No diarrhea or vomiting today, kept down some liquid diet. No abd pain or fevers.   Filed Vitals:   03/25/15 0229 03/25/15 0256 03/25/15 0543 03/25/15 0727  BP: 117/78 128/73 120/66 99/68  Pulse: 89 88 92 100  Temp:  98.8 F (37.1 C) 98.8 F (37.1 C) 99.5 F (37.5 C)  TempSrc:  Oral Oral Oral  Resp: 15 16 18 18   Height:      Weight:  62.3 kg (137 lb 5.6 oz)    SpO2:  100% 99% 97%    Inpatient medications: . ciprofloxacin  500 mg Oral Q breakfast  . [START ON 03/26/2015] doxercalciferol  4 mcg Intravenous Q T,Th,Sa-HD  . heparin  5,000 Units Subcutaneous 3 times per day  . levothyroxine  50 mcg Oral QAC breakfast  . metroNIDAZOLE  500 mg Oral 3 times per day  . ondansetron (ZOFRAN) IV  4 mg Intravenous Once     acetaminophen **OR** acetaminophen, morphine injection, ondansetron **OR** ondansetron (ZOFRAN) IV  Exam: Alert WDWN, no distress No jvd Chest clear bilat RRR 2/6 murmur Abd soft nontender +bs Ext no sig edema Neuro alert ox 3 RUA AVG +bruit  D[facility]: [facility]  3.5h   62.5kg  2/3.5Ca bath  RUA AVG  Hep 5200   Hect 4ug Mircera 50 every 6 wks  > Hb 11/ 55% sat/ pth 386       Assessment: 1. Abd pain / vomiting/ diarrhea - resolving, on po cipro/ flagyl.   2. ESRD - TTS HD 3. Hypertension/volume - on norvasc 5 mg on non HD days. At dry wt.  4. Anemia - hgb 12.6 no ESA 5. 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 PTH remains elevated but controlled with hectorol 4 q HD. P borderline on tums - change to 2.5 Ca bath for now and probably at discharge; using 4 K bath today which has 2.25 bath - not taking tums due to GI issues - repeat labs in am. Nutrition - sips CL as tol 6. Hx tachycardia - previously on BB - none at present 7. Tobacco abuse -  8. S/p right thyroid lobectomy 04/2014 for recur sec HPTH -  Not taking synthroid, it was  stopped " a few months ago" by her MD  Plan - HD tomorrow, UF to dry wt   Kelly Splinter MD [place] pager [phone]    cell [phone] 03/25/2015, 12:54 PM    Recent Labs Lab 03/24/15 0356 03/25/15 0500  NA 137 137  K 3.9 3.6  CL 98* 101  CO2 21* 25  GLUCOSE 98 81  BUN 35* 12  CREATININE 11.58* 6.17*  CALCIUM 10.1 8.7*    Recent Labs Lab 03/24/15 0356  AST 16  ALT 6*  ALKPHOS 41  BILITOT 0.4  PROT 7.0  ALBUMIN 3.5    Recent Labs Lab 03/24/15 0356 03/25/15 0500  WBC 8.6 5.9  HGB 12.6 12.3  HCT 37.5 35.8*  MCV 91.2 91.1  PLT 205 155

## 2015-03-26 LAB — CBC
HEMATOCRIT: 33.6 % — AB (ref 36.0–46.0)
HEMOGLOBIN: 11.5 g/dL — AB (ref 12.0–15.0)
MCH: 31.6 pg (ref 26.0–34.0)
MCHC: 34.2 g/dL (ref 30.0–36.0)
MCV: 92.3 fL (ref 78.0–100.0)
Platelets: 155 10*3/uL (ref 150–400)
RBC: 3.64 MIL/uL — AB (ref 3.87–5.11)
RDW: 13.5 % (ref 11.5–15.5)
WBC: 6.1 10*3/uL (ref 4.0–10.5)

## 2015-03-26 LAB — BASIC METABOLIC PANEL
ANION GAP: 13 (ref 5–15)
BUN: 22 mg/dL — ABNORMAL HIGH (ref 6–20)
CALCIUM: 9.2 mg/dL (ref 8.9–10.3)
CHLORIDE: 97 mmol/L — AB (ref 101–111)
CO2: 27 mmol/L (ref 22–32)
Creatinine, Ser: 10.52 mg/dL — ABNORMAL HIGH (ref 0.44–1.00)
GFR calc non Af Amer: 4 mL/min — ABNORMAL LOW (ref >60)
GFR, EST AFRICAN AMERICAN: 4 mL/min — AB (ref >60)
GLUCOSE: 83 mg/dL (ref 65–99)
POTASSIUM: 3.9 mmol/L (ref 3.5–5.1)
Sodium: 137 mmol/L (ref 135–145)

## 2015-03-26 MED ORDER — PENTAFLUOROPROP-TETRAFLUOROETH EX AERO: 1.0000 "application " | INHALATION_SPRAY | CUTANEOUS | Status: DC | PRN

## 2015-03-26 MED ORDER — LIDOCAINE HCL (PF) 1 % IJ SOLN
5.0000 mL | INTRAMUSCULAR | Status: DC | PRN
  Filled 2015-03-26: qty 5

## 2015-03-26 MED ORDER — LIDOCAINE-PRILOCAINE 2.5-2.5 % EX CREA
1.0000 "application " | TOPICAL_CREAM | CUTANEOUS | Status: DC | PRN
  Filled 2015-03-26: qty 5

## 2015-03-26 MED ORDER — SODIUM CHLORIDE 0.9 % IV SOLN: 100.0000 mL | INTRAVENOUS | Status: DC | PRN

## 2015-03-26 MED ORDER — DOXERCALCIFEROL 4 MCG/2ML IV SOLN
INTRAVENOUS | Status: AC
  Administered 2015-03-26: 4 ug
  Filled 2015-03-26: qty 2

## 2015-03-26 MED ORDER — ALTEPLASE 2 MG IJ SOLR
2.0000 mg | Freq: Once | INTRAMUSCULAR | Status: DC | PRN
Start: 2015-03-26 — End: 2015-03-26
  Filled 2015-03-26: qty 2

## 2015-03-26 MED ORDER — HEPARIN SODIUM (PORCINE) 1000 UNIT/ML DIALYSIS
5000.0000 [IU] | Freq: Once | INTRAMUSCULAR | Status: DC
Start: 2015-03-27 — End: 2015-03-26
  Filled 2015-03-26: qty 5

## 2015-03-26 MED ORDER — HEPARIN SODIUM (PORCINE) 1000 UNIT/ML DIALYSIS
1000.0000 [IU] | INTRAMUSCULAR | Status: DC | PRN
  Filled 2015-03-26: qty 1

## 2015-03-26 NOTE — Progress Notes (Signed)
[facility] Progress Note  Dialysis: [facility] 3.5h 62.5kg 2/3.5Ca bath RUA AVG Hep 5200  Hect 4ug Mircera 50 every 6 wks > Hb 11/ 55% sat/ pth 386   Assessment/Plan: 1. Abd pain / vomiting/ diarrhea - enteritis by CT scan. Resolving, on po cipro/ flagyl, but some flagyl intolerance though she thinks it maybe due to taking on an empty stomach..  2. ESRD - TTS HD- K 3.9 use 4 k bath today 3. Hypertension/volume - on norvasc 5 mg on non HD days. Below edw due to poor intake -keeping even 4. Anemia - hgb 11.5 no ESA 5. 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 PTH remains elevated but controlled with hectorol 4 q HD.Ca 9.2 - change to 2.5 Ca bath at discharge- doesn't need added Ca bath - today using 2.25 Ca bath with 4 K bath 6. Nutrition -FL- advance as tolerated 7. Hx tachycardia - previously on BB - none at present 8. Tobacco abuse - encourage cessation 9. S/p right thyroid lobectomy 04/2014 for recur sec HPTH - Not taking synthroid anymore, it was stopped " a few months ago" by her MD  Myriam Jacobson, PA-C [place] [phone] 03/26/2015,8:52 AM  LOS: 2 days   Pt seen, examined and agree w A/P as above. Nausea better, wants to try solid food today.   Kelly Splinter MD [facility] pager [phone]    cell [phone] 03/26/2015, 11:10 AM    Subjective:   One of the pills made her vomit- she thinks it was the flagyl  Objective Filed Vitals:   03/26/15 0734 03/26/15 0739 03/26/15 0800 03/26/15 0830  BP: 111/71 113/61 109/60 104/63  Pulse: 68 61 68 70  Temp:      TempSrc:      Resp:      Height:      Weight:      SpO2:       Physical Exam General: slender, NAD on HD Heart: RRR Lungs: no rales  Abdomen: soft no sig tenderness + BS Extremities: no LE edema Dialysis Access: right upper AVGG  Additional Objective Labs: Basic Metabolic Panel:  Recent  Labs Lab 03/24/15 0356 03/25/15 0500 03/26/15 0747  NA 137 137 137  K 3.9 3.6 3.9  CL 98* 101 97*  CO2 21* 25 27  GLUCOSE 98 81 83  BUN 35* 12 22*  CREATININE 11.58* 6.17* 10.52*  CALCIUM 10.1 8.7* 9.2   Liver Function Tests:  Recent Labs Lab 03/24/15 0356  AST 16  ALT 6*  ALKPHOS 41  BILITOT 0.4  PROT 7.0  ALBUMIN 3.5    Recent Labs Lab 03/24/15 0356  LIPASE 44   CBC:  Recent Labs Lab 03/24/15 0356 03/25/15 0500 03/26/15 0747  WBC 8.6 5.9 6.1  HGB 12.6 12.3 11.5*  HCT 37.5 35.8* 33.6*  MCV 91.2 91.1 92.3  PLT 205 155 155   Cardiac Enzymes:  Recent Labs Lab 03/24/15 0902  TROPONINI <0.03    StMedications:   . ciprofloxacin  500 mg Oral Q breakfast  . doxercalciferol  4 mcg Intravenous Q T,Th,Sa-HD  . heparin  5,000 Units Subcutaneous 3 times per day  . [START ON 03/27/2015] heparin  5,000 Units Dialysis Once in dialysis  . metroNIDAZOLE  500 mg Oral 3 times per day  . ondansetron (ZOFRAN) IV  4 mg Intravenous Once

## 2015-03-26 NOTE — Progress Notes (Signed)
[facility] PROGRESS NOTE  [name] [id number] DOB: [birth year] DOA: 03/24/2015  PCP: Hoyt Koch, MD  Brief HPI: 55 year old African-American female with past medical history of hypertension, hypothyroidism, end-stage renal disease on hemodialysis, presented with complaints of nausea, vomiting, diarrhea and abdominal pain. CT scan revealed findings suggestive of enteritis. Patient was hospitalized for further management.  Past medical history:  Past Medical History  Diagnosis Date  . HTN (hypertension)   . Pancreatitis   . Anxiety   . Anemia   . Claustrophobia   . Secondary hyperparathyroidism ([place])     [name] 05/11/2014  . Heart murmur   . Hypothyroidism   . ESRD (end stage renal disease) on dialysis ([place])     "TTS; [place]" (03/24/2015)  . Glomerulonephritis   . Renal insufficiency     Consultants: Nephrology  Procedures: Hemodialysis 3/7, 3/9  Antibiotics: Cipro and Flagyl 3/8  Subjective: Patient got off very nauseated after taking Flagyl overnight. She did have an episode of vomiting. No further episodes of diarrhea. No nausea this morning. Requesting advancement in diet. Denies any abdominal pain. Passing gas.  Objective: Vital Signs  Filed Vitals:   03/26/15 1000 03/26/15 1030 03/26/15 1100 03/26/15 1108  BP: 101/60 107/60 111/62 113/64  Pulse: 69 69 66 63  Temp:    98.8 F (37.1 C)  TempSrc:    Oral  Resp:    20  Height:      Weight:    61.9 kg (136 lb 7.4 oz)  SpO2:    100%    Intake/Output Summary (Last 24 hours) at 03/26/15 1226 Last data filed at 03/26/15 1108  Gross per 24 hour  Intake      0 ml  Output      1 ml  Net     -1 ml   Filed Weights   03/25/15 0256 03/26/15 0720 03/26/15 1108  Weight: 62.3 kg (137 lb 5.6 oz) 62 kg (136 lb 11 oz) 61.9 kg (136 lb 7.4 oz)    General appearance: alert, cooperative, appears stated age and no distress Resp: clear to auscultation bilaterally Cardio: regular rate  and rhythm, S1, S2 normal, no murmur, click, rub or gallop GI: Abdomen remains soft. Nontender today. Bowel sounds are present. No masses or organomegaly.  Extremities: extremities normal, atraumatic, no cyanosis or edema Neurologic: Awake and alert. Oriented 3. No focal neurological deficits  Lab Results:  Basic Metabolic Panel:  Recent Labs Lab 03/24/15 0356 03/25/15 0500 03/26/15 0747  NA 137 137 137  K 3.9 3.6 3.9  CL 98* 101 97*  CO2 21* 25 27  GLUCOSE 98 81 83  BUN 35* 12 22*  CREATININE 11.58* 6.17* 10.52*  CALCIUM 10.1 8.7* 9.2   Liver Function Tests:  Recent Labs Lab 03/24/15 0356  AST 16  ALT 6*  ALKPHOS 41  BILITOT 0.4  PROT 7.0  ALBUMIN 3.5    Recent Labs Lab 03/24/15 0356  LIPASE 44   CBC:  Recent Labs Lab 03/24/15 0356 03/25/15 0500 03/26/15 0747  WBC 8.6 5.9 6.1  HGB 12.6 12.3 11.5*  HCT 37.5 35.8* 33.6*  MCV 91.2 91.1 92.3  PLT 205 155 155   Cardiac Enzymes:  Recent Labs Lab 03/24/15 0902  TROPONINI <0.03    Studies/Results: No results found.  Medications:  Scheduled: . ciprofloxacin  500 mg Oral Q breakfast  . doxercalciferol  4 mcg Intravenous Q T,Th,Sa-HD  . heparin  5,000 Units Subcutaneous 3 times per  day  . ondansetron (ZOFRAN) IV  4 mg Intravenous Once   Continuous:  [id number] **OR** acetaminophen, morphine injection, ondansetron **OR** ondansetron (ZOFRAN) IV  Assessment/Plan:  Principal Problem:   Abdominal pain Active Problems:   ESRD on dialysis ([facility])   Hypothyroidism   Nausea & vomiting   History of tachycardia   Enteritis   Uncontrollable vomiting    Acute enteritis CT scan with contrast suggests enteritis with possible partial SBO. Suspect she has infectious enteritis given the acute onset. Symptoms appear to be resolving. Patient was started on Cipro and Flagyl. However, she developed nausea and vomiting with Flagyl. Since her diarrhea has resolved, we will stop Flagyl. Continue just  Cipro for now. Patient's diet will be advanced to soft diet.   ESRD on dialysis Tu/Th/Sat Nephrology is following. Dialysis today.   Hypothyroidism Apparently hormone replacement stopped almost 1 year ago. TSH is 5.0. Discussed with patient today. Plan will be to hold off on resuming Synthroid. She will need to have repeat Thyroid function tests in a few weeks and then further determination to be made at that time.   History of tachycardia Patient takes metoprolol as needed.  Tobacco abuse Would like to quit. Nicotine patches make her sick. Outpatient follow-up.  DVT Prophylaxis: Subcutaneous heparin    Code Status: Full code  Family Communication: Discussed with the patient  Disposition Plan: Advance diet today. Anticipate discharge tomorrow.    LOS: 2 days   [facility] Pager [phone] 03/26/2015, 12:26 PM  If 7PM-7AM, please contact night-coverage at [email or web address], password [facility]

## 2015-03-26 NOTE — Progress Notes (Signed)
Spoke w/ Dr [place] Pink this am r/t pt refuses Hep SQ.Pt is mobile

## 2015-03-26 NOTE — Care Management Note (Signed)
Case Management Note  Patient Details  Name: [name] MRN: [id number] Date of Birth: Nov 30, 1960  Subjective/Objective:                 Patient admitted with Abd pain, had HD, IV Abx. Dc'd when tolerated diet.   Action/Plan:  No CM needs identified. DC'd to home, self care.  Expected Discharge Date:                  Expected Discharge Plan:  Home/Self Care  In-House Referral:     Discharge planning Services  CM Consult  Post Acute Care Choice:    Choice offered to:     DME Arranged:    DME Agency:     HH Arranged:    [place] Agency:     Status of Service:  Completed, signed off  Medicare Important Message Given:    Date Medicare IM Given:    Medicare IM give by:    Date Additional Medicare IM Given:    Additional Medicare Important Message give by:     If discussed at [place] of Stay Meetings, dates discussed:    Additional Comments:  Carles Collet, RN 03/26/2015, 1:57 PM

## 2015-03-27 MED ORDER — CIPROFLOXACIN HCL 500 MG PO TABS: 500.0000 mg | ORAL_TABLET | Freq: Every day | ORAL | Status: DC

## 2015-03-27 NOTE — Discharge Summary (Signed)
[facility]  Physician Discharge Summary   Patient ID: [name] MRN: [id number] DOB/AGE: Dec 31, 1960 55 y.o.  Admit date: 03/24/2015 Discharge date: 03/27/2015  PCP: Hoyt Koch, MD  DISCHARGE DIAGNOSES:  Principal Problem:   Abdominal pain Active Problems:   ESRD on dialysis [facility])   Hypothyroidism   Nausea & vomiting   History of tachycardia   Enteritis   Uncontrollable vomiting   RECOMMENDATIONS FOR OUTPATIENT FOLLOW UP: 1. Patient to continue her outpatient dialysis schedule 2. Patient to have repeat thyroid function tests as outpatient in a few weeks. Please see discussion below.  DISCHARGE CONDITION: fair  Diet recommendation: As before  Filed Weights   03/25/15 0256 03/26/15 0720 03/26/15 1108  Weight: 62.3 kg (137 lb 5.6 oz) 62 kg (136 lb 11 oz) 61.9 kg (136 lb 7.4 oz)    INITIAL HISTORY: 55 year old African-American female with past medical history of hypertension, hypothyroidism, end-stage renal disease on hemodialysis, presented with complaints of nausea, vomiting, diarrhea and abdominal pain. CT scan revealed findings suggestive of enteritis. Patient was hospitalized for further management.  Consultants: Nephrology  Procedures: Hemodialysis 3/7, 3/9   HOSPITAL COURSE:   Acute enteritis CT scan with contrast suggests enteritis with possible partial SBO. Suspect she had infectious enteritis given the acute onset. Patient was started on Cipro and Flagyl. However, she did not tolerate Flagyl due to extreme nausea. Patient's symptoms improved rapidly. Flagyl was discontinued. She was continued only on ciprofloxacin. Stool studies could not be sent since her diarrhea had resolved. Patient was slowly advanced on her diet. She is feeling much better today and wants to go home.   ESRD on dialysis Tu/Th/Sat Nephrology was consulted. She was dialyzed in the hospital. She may resume her usual outpatient schedule.   Remote history of  Hypothyroidism Apparently hormone replacement stopped almost 1 year ago. TSH is 5.0. Discussed with patient. Plan will be to hold off on resuming Synthroid. She will need to have repeat Thyroid function tests in a few weeks by her PCP and then further determination to be made at that time.   History of tachycardia Patient takes metoprolol as needed.  Tobacco abuse Would like to quit. Nicotine patches make her sick. Outpatient follow-up.  Overall improved. Jennet Maduro on going home today. Stable for discharge.   PERTINENT LABS:  The results of significant diagnostics from this hospitalization (including imaging, microbiology, ancillary and laboratory) are listed below for reference.     Labs: Basic Metabolic Panel:  Recent Labs Lab 03/24/15 0356 03/25/15 0500 03/26/15 0747  NA 137 137 137  K 3.9 3.6 3.9  CL 98* 101 97*  CO2 21* 25 27  GLUCOSE 98 81 83  BUN 35* 12 22*  CREATININE 11.58* 6.17* 10.52*  CALCIUM 10.1 8.7* 9.2   Liver Function Tests:  Recent Labs Lab 03/24/15 0356  AST 16  ALT 6*  ALKPHOS 41  BILITOT 0.4  PROT 7.0  ALBUMIN 3.5    Recent Labs Lab 03/24/15 0356  LIPASE 44   CBC:  Recent Labs Lab 03/24/15 0356 03/25/15 0500 03/26/15 0747  WBC 8.6 5.9 6.1  HGB 12.6 12.3 11.5*  HCT 37.5 35.8* 33.6*  MCV 91.2 91.1 92.3  PLT 205 155 155   Cardiac Enzymes:  Recent Labs Lab 03/24/15 0902  TROPONINI <0.03    IMAGING STUDIES Ct Abdomen Pelvis W Contrast  03/24/2015  CLINICAL DATA:  Upper abdominal pain starting last night 11 p.m., history of chronic pancreatitis EXAM: CT ABDOMEN AND PELVIS WITH  CONTRAST TECHNIQUE: Multidetector CT imaging of the abdomen and pelvis was performed using the standard protocol following bolus administration of intravenous contrast. CONTRAST:  19mL OMNIPAQUE IOHEXOL 300 MG/ML  SOLN COMPARISON:  CT scan 09/11/2005 FINDINGS: Lower chest:  Lung bases are unremarkable. Hepatobiliary: No focal hepatic mass. No intrahepatic  biliary ductal dilatation. Pancreas: No evidence of acute pancreatitis. Main pancreatic duct measures 3.4 mm in diameter without significant change from prior exam. No focal pancreatic mass. Spleen: No focal splenic mass. There are sub- diaphragmatic calcification just above the posterior aspect of splenic capsule. Adrenals/Urinary Tract: Again noted atrophic kidney with multiple renal cysts consistent with adult polycystic kidney disease. Punctate nonobstructive bilateral renal calcifications. No hydronephrosis or hydroureter. No adrenal gland mass is noted. Delayed renal images shows absent excretion bilateral renal collecting system and bilateral ureter. Stomach/Bowel: There are multiple fluid distended in mid abdomen mid pelvis and right lower quadrant. There is some enhancement of the small bowel wall axial image 41 mid anterior abdomen. Findings are highly suspicious for segmental small bowel enteritis, less likely partial small bowel obstruction. Inflammatory bowel disease cannot be excluded. Nonspecific infectious or inflammatory enteritis cannot be excluded. There is some narrowing of the lumen of terminal ileum and fatty thickening of the wall without definite evidence of small bowel obstruction. Nonspecific mild thickening of the wall of the cecum which may be seen dorsal and inflammatory bowel disease. No definite acute colitis. There are coarse calcification in right paracolic gutter mesentery. There are linear calcification in mid mesenteric within pelvis. Coarse calcifications are noted in left anterior pelvic mesentery. Findings may be related to chronic peritoneal dialysis or sequela from prior peritoneal hemorrhage. Less likely peritoneal neoplastic process. No gastric outlet obstruction. The colon is empty partially collapsed. There is diffuse colonic diverticulosis. No definite evidence of acute colitis or diverticulitis. Axial image 61 mild saccular dilatation of distal small bowel in mid  pelvis. No evidence of small bowel perforation. Vascular/Lymphatic: Extensive atherosclerotic calcifications of abdominal aorta and iliac arteries are noted. No adenopathy. Reproductive: Vascular calcifications are noted bilateral adnexal region. The uterus is anteflexed. Probable hemorrhagic follicle within left ovary measures 1.2 cm. Other: There is small amount of pelvic ascites in right pelvis and right lower paracolic gutter. No evidence of free abdominal air. 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 There is mild compression deformity upper endplate of QA348G, 624THL and T12 vertebral bodies of indeterminate age. No bony protrusion in spinal canal. IMPRESSION: 1. There is abnormal dilatation of small bowel in mid abdomen mid pelvis and right lower quadrant with few air-fluid levels. Mild enhancement of the small bowel wall. There is small amount of adjacent ascites. Mild focal saccular dilatation of distal small bowel in axial image 53. There is some thickening of terminal ileal wall. There is some thickening of cecal wall. Findings are highly suspicious for small bowel enteritis, inflammatory bowel disease, infectious enteritis or less likely partial small bowel obstruction. Mild inflammatory changes in terminal ileum and cecum. The appendix is not identified. 2. Diffuse colonic diverticulosis. No evidence of acute diverticulitis. 3. There are multiple coarse peritoneal calcifications. This may be related to peritoneal dialysis or sequela from prior peritoneal hemorrhage less likely peritoneal neoplastic process. 4. There is no evidence of acute pancreatitis. Stable diameter of main pancreatic duct measures 3.4 mm. 5. Again noted atrophic kidneys with multiple cysts probable due to polycystic kidney disease. There is absent excretion bilateral renal collecting system and  bilateral ureter on delayed images. 6. Extensive  atherosclerotic calcifications of abdominal aorta and iliac arteries. No aortic aneurysm. 7. Diffuse sclerotic bone within spine and pelvis suspicious for chronic renal osteodystrophy. Mild compression deformity upper endplate of QA348G, 624THL and T12 vertebral bodies of indeterminate age. No bony protrusion in spinal canal Electronically Signed   By: Lahoma Crocker M.D.   On: 03/24/2015 12:10    DISCHARGE EXAMINATION: Filed Vitals:   03/26/15 1108 03/26/15 1438 03/26/15 2242 03/27/15 0547  BP: 113/64 91/49 134/70 125/60  Pulse: 63 84 73 73  Temp: 98.8 F (37.1 C) 98.2 F (36.8 C) 98.8 F (37.1 C) 98.9 F (37.2 C)  TempSrc: Oral Oral Oral Oral  Resp: 20 16 20 18   Height:      Weight: 61.9 kg (136 lb 7.4 oz)     SpO2: 100% 99% 100% 97%   General appearance: alert, cooperative, appears stated age and no distress Resp: clear to auscultation bilaterally Cardio: regular rate and rhythm, S1, S2 normal, no murmur, click, rub or gallop GI: soft, non-tender; bowel sounds normal; no masses,  no organomegaly  DISPOSITION: Home  Discharge Instructions    Call MD for:  difficulty breathing, headache or visual disturbances    Complete by:  As directed      Call MD for:  extreme fatigue    Complete by:  As directed      Call MD for:  persistant dizziness or light-headedness    Complete by:  As directed      Call MD for:  persistant nausea and vomiting    Complete by:  As directed      Call MD for:  severe uncontrolled pain    Complete by:  As directed      Call MD for:  temperature >100.4    Complete by:  As directed      Discharge instructions    Complete by:  As directed   Please follow up with your PCP next week. Please go for dialysis as per your usual schedule. Eat a soft diet for the next 2-3 days.  You were cared for by a hospitalist during your hospital stay. If you have any questions about your discharge medications or the care you received while you were in the hospital after you are  discharged, you can call the unit and asked to speak with the hospitalist on call if the hospitalist that took care of you is not available. Once you are discharged, your primary care physician will handle any further medical issues. Please note that NO REFILLS for any discharge medications will be authorized once you are discharged, as it is imperative that you return to your primary care physician (or establish a relationship with a primary care physician if you do not have one) for your aftercare needs so that they can reassess your need for medications and monitor your lab values. If you do not have a primary care physician, you can call [phone] for a physician referral.     Increase activity slowly    Complete by:  As directed            ALLERGIES:  Ace Inhibitors Hives and Nausea And Vomiting  Codeine Hives  Penicillins Hives     Discharge Medication List as of 03/27/2015 11:05 AM    START taking these medications   Details  ciprofloxacin (CIPRO) 500 MG tablet Take 1 tablet (500 mg total) by mouth daily with breakfast. Starting 03/28/15 after dialysis, Starting  03/28/2015, Until Discontinued, Normal      CONTINUE these medications which have NOT CHANGED   Details  metoprolol tartrate (LOPRESSOR) 25 MG tablet Take 25 mg by mouth daily as needed (as instructed by MD). , Until Discontinued, Historical Med    ondansetron (ZOFRAN ODT) 4 MG disintegrating tablet Take 1 tablet (4 mg total) by mouth every 8 (eight) hours as needed for nausea., Starting 11/26/2014, Until Discontinued, Print      STOP taking these medications     levothyroxine (SYNTHROID, LEVOTHROID) 50 MCG tablet        Follow-up Information    Follow up with Hoyt Koch, MD. Schedule an appointment as soon as possible for a visit in 1 week.   Specialty:  Internal Medicine   Why:  post hospitalization follow up and to discuss thyroid tests   Contact information:   [address] [place]  [address] [phone]       TOTAL DISCHARGE TIME: 35 minutes  [facility] Pager [phone]  03/27/2015, 3:02 PM

## 2015-03-27 NOTE — Progress Notes (Signed)
Nsg Discharge Note  Admit Date:  03/24/2015 Discharge date: 03/27/2015   CARALENA HASCH to be D/C'd Home per MD order.  AVS completed.  Copy for chart, and copy for patient signed, and dated. Patient/caregiver able to verbalize understanding.  Discharge Medication:   Medication List    STOP taking these medications        levothyroxine 50 MCG tablet  Commonly known as:  SYNTHROID, LEVOTHROID      TAKE these medications        ciprofloxacin 500 MG tablet  Commonly known as:  CIPRO  Take 1 tablet (500 mg total) by mouth daily with breakfast. Starting 03/28/15 after dialysis  Start taking on:  03/28/2015     metoprolol tartrate 25 MG tablet  Commonly known as:  LOPRESSOR  Take 25 mg by mouth daily as needed (as instructed by MD).     ondansetron 4 MG disintegrating tablet  Commonly known as:  ZOFRAN ODT  Take 1 tablet (4 mg total) by mouth every 8 (eight) hours as needed for nausea.        Discharge Assessment: Filed Vitals:   03/26/15 2242 03/27/15 0547  BP: 134/70 125/60  Pulse: 73 73  Temp: 98.8 F (37.1 C) 98.9 F (37.2 C)  Resp: 20 18   Skin clean, dry and intact without evidence of skin break down, no evidence of skin tears noted. IV catheter discontinued intact. Site without signs and symptoms of complications - no redness or edema noted at insertion site, patient denies c/o pain - only slight tenderness at site.  Dressing with slight pressure applied.  D/c Instructions-Education: Discharge instructions given to patient/family with verbalized understanding. D/c education completed with patient/family including follow up instructions, medication list, d/c activities limitations if indicated, with other d/c instructions as indicated by MD - patient able to verbalize understanding, all questions fully answered. Patient instructed to return to ED, call 911, or call MD for any changes in condition.  Patient escorted via [place], and D/C home via private auto.  Danniella Robben  Margaretha Sheffield, RN 03/27/2015 12:48 PM

## 2015-03-27 NOTE — Discharge Instructions (Signed)

## 2015-03-27 NOTE — Care Management Important Message (Signed)
Important Message  Patient Details  Name: [name] MRN: [id number] Date of Birth: 07-01-1960   Medicare Important Message Given:  Yes    Jamiee Milholland Abena 03/27/2015, 11:31 AM

## 2015-03-27 NOTE — Progress Notes (Signed)
  [facility] Progress Note  Dialysis: [facility] 3.5h 62.5kg 2/3.5Ca bath RUA AVG Hep 5200  Hect 4ug Mircera 50 every 6 wks > Hb 11/ 55% sat/ pth 386   Assessment: 1. Abd pain / vomiting/ diarrhea - enteritis by CT scan. Resolving, eating solid foods now, wants to go home.   2. ESRD - TTS HD 3. Hypertension/volume - on norvasc 5 mg on non HD days. Below edw due to poor intake -keeping even 4. Anemia - hgb 11.5 no ESA 5. 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 PTH remains elevated but controlled with hectorol 4 q HD.Ca 9.2 - change to 2.5 Ca bath at discharge- doesn't need added Ca bath - using 2.25 Ca bath with 4 K bath 6. Nutrition -FL- advance as tolerated 7. Hx tachycardia - previously on BB - none at present 8. Tobacco abuse - encourage cessation 9. S/p right thyroid lobectomy 04/2014 for recur sec HPTH - Not taking synthroid anymore, it was stopped " a few months ago" by her MD  Plan - ok for dc from renal standpoint  Kelly Splinter MD [facility] pager [phone]    cell [phone] 03/27/2015, 10:07 AM    Subjective:  No vomiting  Objective Filed Vitals:   03/26/15 1108 03/26/15 1438 03/26/15 2242 03/27/15 0547  BP: 113/64 91/49 134/70 125/60  Pulse: 63 84 73 73  Temp: 98.8 F (37.1 C) 98.2 F (36.8 C) 98.8 F (37.1 C) 98.9 F (37.2 C)  TempSrc: Oral Oral Oral Oral  Resp: 20 16 20 18   Height:      Weight: 61.9 kg (136 lb 7.4 oz)     SpO2: 100% 99% 100% 97%   Physical Exam General: slender, NAD on HD Heart: RRR Lungs: no rales  Abdomen: soft no sig tenderness + BS Extremities: no LE edema Dialysis Access: right upper AVGG  Additional Objective Labs: Basic Metabolic Panel:  Recent Labs Lab 03/24/15 0356 03/25/15 0500 03/26/15 0747  NA 137 137 137  K 3.9 3.6 3.9  CL 98* 101 97*  CO2 21* 25 27  GLUCOSE 98 81 83  BUN 35* 12 22*  CREATININE 11.58* 6.17* 10.52*   CALCIUM 10.1 8.7* 9.2   Liver Function Tests:  Recent Labs Lab 03/24/15 0356  AST 16  ALT 6*  ALKPHOS 41  BILITOT 0.4  PROT 7.0  ALBUMIN 3.5    Recent Labs Lab 03/24/15 0356  LIPASE 44   CBC:  Recent Labs Lab 03/24/15 0356 03/25/15 0500 03/26/15 0747  WBC 8.6 5.9 6.1  HGB 12.6 12.3 11.5*  HCT 37.5 35.8* 33.6*  MCV 91.2 91.1 92.3  PLT 205 155 155   Cardiac Enzymes:  Recent Labs Lab 03/24/15 0902  TROPONINI <0.03    StMedications:   . ciprofloxacin  500 mg Oral Q breakfast  . doxercalciferol  4 mcg Intravenous Q T,Th,Sa-HD  . heparin  5,000 Units Subcutaneous 3 times per day  . ondansetron (ZOFRAN) IV  4 mg Intravenous Once

## 2015-03-28 DIAGNOSIS — D631 Anemia in chronic kidney disease: Secondary | ICD-10-CM | POA: Diagnosis not present

## 2015-03-28 DIAGNOSIS — N2581 Secondary hyperparathyroidism of renal origin: Secondary | ICD-10-CM | POA: Diagnosis not present

## 2015-03-28 DIAGNOSIS — N186 End stage renal disease: Secondary | ICD-10-CM | POA: Diagnosis not present

## 2015-03-31 DIAGNOSIS — D631 Anemia in chronic kidney disease: Secondary | ICD-10-CM | POA: Diagnosis not present

## 2015-03-31 DIAGNOSIS — N186 End stage renal disease: Secondary | ICD-10-CM | POA: Diagnosis not present

## 2015-03-31 DIAGNOSIS — N2581 Secondary hyperparathyroidism of renal origin: Secondary | ICD-10-CM | POA: Diagnosis not present

## 2015-04-01 ENCOUNTER — Ambulatory Visit (INDEPENDENT_AMBULATORY_CARE_PROVIDER_SITE_OTHER): Payer: Medicare Other | Admitting: Family

## 2015-04-01 ENCOUNTER — Encounter: Payer: Self-pay | Admitting: Family

## 2015-04-01 VITALS — BP 120/80 | HR 84 | Temp 97.9°F | Resp 16 | Ht 67.0 in | Wt 141.0 lb

## 2015-04-01 DIAGNOSIS — K529 Noninfective gastroenteritis and colitis, unspecified: Secondary | ICD-10-CM

## 2015-04-01 DIAGNOSIS — E039 Hypothyroidism, unspecified: Secondary | ICD-10-CM

## 2015-04-01 NOTE — Progress Notes (Signed)
Pre visit review using our clinic review tool, if applicable. No additional management support is needed unless otherwise documented below in the visit note. 

## 2015-04-01 NOTE — Patient Instructions (Addendum)
Thank you for choosing [facility].  Summary/Instructions:  Please continue to take your medication as prescribed.  Continue with dialysis as scheduled.  Follow-up with Dr. Sharlet Salina as scheduled.

## 2015-04-01 NOTE — Assessment & Plan Note (Signed)
Appears stable and managed without medication at present. Not currently experiencing symptoms and has not been on medication in 1 year. Continue to monitor with no medication at this time. Follow up if symptoms develop.

## 2015-04-01 NOTE — Assessment & Plan Note (Signed)
Symptoms of enteritis appear resolved with completion of antibiotics and no further symptoms. Continue to advance diet as tolerated. No further assessment or treatment is needed at this time. Follow up if symptoms return.

## 2015-04-01 NOTE — Progress Notes (Signed)
Subjective:    Patient ID: [name], female    DOB: April 07, 1960, 55 y.o.   MRN: [id number]  Chief Complaint  Patient presents with  . Hospitalization Follow-up    was told to ask if she needs to get back on her thyroid medication, she feels alot better since being out of the hospital and taking the antibiotic    HPI:  [name] is a 55 y.o. female who  has a past medical history of HTN (hypertension); Pancreatitis; Anxiety; Anemia; Claustrophobia; Secondary hyperparathyroidism ([place]); Heart murmur; Hypothyroidism; ESRD (end stage renal disease) on dialysis ([place]); Glomerulonephritis; and Renal insufficiency. and presents today for an office follow up after hospitalization.  Recently evaluated in the emergency department and admitted to the hospital with complaints of epigastric pain rating a severity of 9/10. She also experienced 4 episodes of nonbloody, nonbilious, non-coffee-ground emesis and loose for the normal stools. She was unable to tolerate food by mouth. Pain at the time was described as colicky. Physical exam showed no evidence of distention or mass in the abdomen with mild tenderness and a fistula in the right antecubital area with good thrill. There was concern for pancreatitis. Blood work with no leukocytes or significant electrolyte abnormality. She was due for dialysis during the day of her visit. Potassium was within normal limits. She was given a small bolus of 250 mL of saline and noted to be improved and able to tolerate foods by mouth. CT with diffuse enteritis with questionable small partial bowel obstruction. Given severity of her pain she was admitted for observation. She was diagnosed with acute enteritis and possible's partial small bowel obstruction. She was started on Cipro and Flagyl, however does not tolerate Flagyl. She is continued on ciprofloxacin only and showed significant improvements. She also was noted to have a remote history of hypothyroidism she was held off  on resuming Synthroid as she had not been on it for a year. With recommendations to follow up with primary care for determination of thyroid hormone replacement if needed. All hospital records, images, and labs were reviewed in detail.  Since leaving the hospital she reports that she is feeling better without fevers, chills, abdominal pain, nausea or vomiting. She notes that she is back to her normal diet and continues to progress. There has been some mild constipation that is currently being treated with docusate sodium. She has returned to dialysis on a Tuesday-Thursday-Saturday schedule. Denies any changes to her skin, hair, nails; fatigue, weight gain or cold intolerance.   Allergies  Allergen Reactions  . Ace Inhibitors Hives and Nausea And Vomiting  . Codeine Hives  . Penicillins Hives    Has patient had a PCN reaction causing immediate rash, facial/tongue/throat swelling, SOB or lightheadedness with hypotension: yes Has patient had a PCN reaction causing severe rash involving mucus membranes or skin necrosis: Non Has patient had a PCN reaction that required hospitalization No Has patient had a PCN reaction occurring within the last 10 years: No If all of the above answers are "NO", then may proceed with Cephalosporin     Current Outpatient Prescriptions on File Prior to Visit  Medication Sig Dispense Refill  . ciprofloxacin (CIPRO) 500 MG tablet Take 1 tablet (500 mg total) by mouth daily with breakfast. Starting 03/28/15 after dialysis 5 tablet 0  . metoprolol tartrate (LOPRESSOR) 25 MG tablet Take 25 mg by mouth daily as needed (as instructed by MD).     Marland Kitchen ondansetron (ZOFRAN ODT) 4 MG disintegrating  tablet Take 1 tablet (4 mg total) by mouth every 8 (eight) hours as needed for nausea. 10 tablet 0   No current facility-administered medications on file prior to visit.     Past Surgical History  Procedure Laterality Date  . Kidney transplant  1997  . Arteriovenous graft placement  Right 1990's?  . Dilatation & currettage/hysteroscopy with resectocope N/A 04/17/2013    Procedure: DILATATION & CURETTAGE/HYSTEROSCOPY WITH RESECTOCOPE;  Surgeon: Marvene Staff, MD;  Location: [facility];  Service: Gynecology;  Laterality: N/A;  . Colonoscopy    . Peritoneal catheter insertion    . Parathyroidectomy  03/2000 05/12/2014    w/neck exploration & autotransplantation/notes 06/02/2010; w/neck exploration  . Dilation and curettage of uterus    . Peritoneal catheter removal  08/1999    [name] 06/02/2010  . Thrombectomy and revision of arterioventous (av) goretex  graft Right 01/2002; 11/2003; 01/2004; 08/07/2004; 08/10/2004; 11/13/2005    [name] 5/1/2012Marland Kitchen [name] 5/16/2012Marland Kitchen [name] 5/16/2012Marland Kitchen [name] 5/16/2012Marland Kitchen [name] 06/02/2010; [name] 06/02/2010  . Av fistula placement Left 09/2000    upper arm/notes 06/02/2010  . Thrombectomy Left 02/2002    fistula/notes 06/02/2010  . Arteriovenous graft placement Left 07/2002    upper arm/notes 06/02/2010  . Thrombectomy and revision of arterioventous (av) goretex  graft Left 08/23/2002; 09/13/2002; 07/08/2003    [name] 5/16/2012Marland Kitchen [name] 5/16/2012Marland Kitchen [name] 06/02/2010  . Parathyroidectomy N/A 05/12/2014    Procedure: PARATHYROIDECTOMY AND NECK EXPLORATION;  Surgeon: Armandina Gemma, MD;  Location: [facility];  Service: General;  Laterality: N/A;  . Breast biopsy Left   . Arteriovenous graft placement Right 07/2003    upper arm/notes 06/02/2010  . Thrombectomy / arteriovenous graft revision  11/2003; 01/2004; 08/07/2005; 08/10/2005; 10/2005    [name] 06/02/2010  . Retinal detachment surgery Left     Review of Systems  Constitutional: Negative for fever and chills.  Respiratory: Negative for chest tightness and shortness of breath.   Cardiovascular: Negative for chest pain, palpitations and leg swelling.  Gastrointestinal: Negative for nausea, vomiting, abdominal pain, diarrhea, constipation and blood in stool.  Neurological: Negative for headaches.      Objective:    BP  120/80 mmHg  Pulse 84  Temp(Src) 97.9 F (36.6 C) (Oral)  Resp 16  Ht 5\' 7"  (1.702 m)  Wt 141 lb (63.957 kg)  BMI 22.08 kg/m2  SpO2 97% Nursing note and vital signs reviewed.  Physical Exam  Constitutional: She is oriented to person, place, and time. She appears well-developed and well-nourished. No distress.  Cardiovascular: Normal rate, regular rhythm, normal heart sounds and intact distal pulses.   Pulmonary/Chest: Effort normal and breath sounds normal.  Abdominal: Normal appearance and bowel sounds are normal. She exhibits no mass. There is no hepatosplenomegaly. There is no tenderness. There is no rigidity, no rebound, no guarding, no tenderness at McBurney's point and negative Murphy's sign.  Neurological: She is alert and oriented to person, place, and time.  Skin: Skin is warm and dry.  Psychiatric: She has a normal mood and affect. Her behavior is normal. Judgment and thought content normal.       Assessment & Plan:   Problem List Items Addressed This Visit      Digestive   Enteritis - Primary    Symptoms of enteritis appear resolved with completion of antibiotics and no further symptoms. Continue to advance diet as tolerated. No further assessment or treatment is needed at this time. Follow up if symptoms return.         Endocrine  Hypothyroidism    Appears stable and managed without medication at present. Not currently experiencing symptoms and has not been on medication in 1 year. Continue to monitor with no medication at this time. Follow up if symptoms develop.         I am having Ms. Koker maintain her metoprolol tartrate, ondansetron, and ciprofloxacin.

## 2015-04-02 DIAGNOSIS — D631 Anemia in chronic kidney disease: Secondary | ICD-10-CM | POA: Diagnosis not present

## 2015-04-02 DIAGNOSIS — N186 End stage renal disease: Secondary | ICD-10-CM | POA: Diagnosis not present

## 2015-04-02 DIAGNOSIS — N2581 Secondary hyperparathyroidism of renal origin: Secondary | ICD-10-CM | POA: Diagnosis not present

## 2015-04-04 DIAGNOSIS — N186 End stage renal disease: Secondary | ICD-10-CM | POA: Diagnosis not present

## 2015-04-04 DIAGNOSIS — D631 Anemia in chronic kidney disease: Secondary | ICD-10-CM | POA: Diagnosis not present

## 2015-04-04 DIAGNOSIS — N2581 Secondary hyperparathyroidism of renal origin: Secondary | ICD-10-CM | POA: Diagnosis not present

## 2015-04-07 DIAGNOSIS — N186 End stage renal disease: Secondary | ICD-10-CM | POA: Diagnosis not present

## 2015-04-07 DIAGNOSIS — D631 Anemia in chronic kidney disease: Secondary | ICD-10-CM | POA: Diagnosis not present

## 2015-04-07 DIAGNOSIS — N2581 Secondary hyperparathyroidism of renal origin: Secondary | ICD-10-CM | POA: Diagnosis not present

## 2015-04-09 DIAGNOSIS — N186 End stage renal disease: Secondary | ICD-10-CM | POA: Diagnosis not present

## 2015-04-09 DIAGNOSIS — N2581 Secondary hyperparathyroidism of renal origin: Secondary | ICD-10-CM | POA: Diagnosis not present

## 2015-04-09 DIAGNOSIS — D631 Anemia in chronic kidney disease: Secondary | ICD-10-CM | POA: Diagnosis not present

## 2015-04-11 DIAGNOSIS — N2581 Secondary hyperparathyroidism of renal origin: Secondary | ICD-10-CM | POA: Diagnosis not present

## 2015-04-11 DIAGNOSIS — N186 End stage renal disease: Secondary | ICD-10-CM | POA: Diagnosis not present

## 2015-04-11 DIAGNOSIS — D631 Anemia in chronic kidney disease: Secondary | ICD-10-CM | POA: Diagnosis not present

## 2015-04-14 DIAGNOSIS — D631 Anemia in chronic kidney disease: Secondary | ICD-10-CM | POA: Diagnosis not present

## 2015-04-14 DIAGNOSIS — N2581 Secondary hyperparathyroidism of renal origin: Secondary | ICD-10-CM | POA: Diagnosis not present

## 2015-04-14 DIAGNOSIS — N186 End stage renal disease: Secondary | ICD-10-CM | POA: Diagnosis not present

## 2015-04-16 DIAGNOSIS — D631 Anemia in chronic kidney disease: Secondary | ICD-10-CM | POA: Diagnosis not present

## 2015-04-16 DIAGNOSIS — N186 End stage renal disease: Secondary | ICD-10-CM | POA: Diagnosis not present

## 2015-04-16 DIAGNOSIS — N2581 Secondary hyperparathyroidism of renal origin: Secondary | ICD-10-CM | POA: Diagnosis not present

## 2015-04-17 DIAGNOSIS — N033 Chronic nephritic syndrome with diffuse mesangial proliferative glomerulonephritis: Secondary | ICD-10-CM | POA: Diagnosis not present

## 2015-04-17 DIAGNOSIS — Z992 Dependence on renal dialysis: Secondary | ICD-10-CM | POA: Diagnosis not present

## 2015-04-17 DIAGNOSIS — N186 End stage renal disease: Secondary | ICD-10-CM | POA: Diagnosis not present

## 2015-04-18 DIAGNOSIS — N2581 Secondary hyperparathyroidism of renal origin: Secondary | ICD-10-CM | POA: Diagnosis not present

## 2015-04-18 DIAGNOSIS — N186 End stage renal disease: Secondary | ICD-10-CM | POA: Diagnosis not present

## 2015-04-21 DIAGNOSIS — N2581 Secondary hyperparathyroidism of renal origin: Secondary | ICD-10-CM | POA: Diagnosis not present

## 2015-04-21 DIAGNOSIS — N186 End stage renal disease: Secondary | ICD-10-CM | POA: Diagnosis not present

## 2015-04-23 DIAGNOSIS — N186 End stage renal disease: Secondary | ICD-10-CM | POA: Diagnosis not present

## 2015-04-23 DIAGNOSIS — N2581 Secondary hyperparathyroidism of renal origin: Secondary | ICD-10-CM | POA: Diagnosis not present

## 2015-04-25 DIAGNOSIS — N2581 Secondary hyperparathyroidism of renal origin: Secondary | ICD-10-CM | POA: Diagnosis not present

## 2015-04-25 DIAGNOSIS — N186 End stage renal disease: Secondary | ICD-10-CM | POA: Diagnosis not present

## 2015-04-28 DIAGNOSIS — N186 End stage renal disease: Secondary | ICD-10-CM | POA: Diagnosis not present

## 2015-04-28 DIAGNOSIS — N2581 Secondary hyperparathyroidism of renal origin: Secondary | ICD-10-CM | POA: Diagnosis not present

## 2015-04-29 DIAGNOSIS — Z992 Dependence on renal dialysis: Secondary | ICD-10-CM | POA: Diagnosis not present

## 2015-04-29 DIAGNOSIS — T82858D Stenosis of vascular prosthetic devices, implants and grafts, subsequent encounter: Secondary | ICD-10-CM | POA: Diagnosis not present

## 2015-04-29 DIAGNOSIS — N186 End stage renal disease: Secondary | ICD-10-CM | POA: Diagnosis not present

## 2015-04-29 DIAGNOSIS — I871 Compression of vein: Secondary | ICD-10-CM | POA: Diagnosis not present

## 2015-04-30 DIAGNOSIS — N186 End stage renal disease: Secondary | ICD-10-CM | POA: Diagnosis not present

## 2015-04-30 DIAGNOSIS — N2581 Secondary hyperparathyroidism of renal origin: Secondary | ICD-10-CM | POA: Diagnosis not present

## 2015-05-02 DIAGNOSIS — N2581 Secondary hyperparathyroidism of renal origin: Secondary | ICD-10-CM | POA: Diagnosis not present

## 2015-05-02 DIAGNOSIS — N186 End stage renal disease: Secondary | ICD-10-CM | POA: Diagnosis not present

## 2015-05-05 DIAGNOSIS — N2581 Secondary hyperparathyroidism of renal origin: Secondary | ICD-10-CM | POA: Diagnosis not present

## 2015-05-05 DIAGNOSIS — N186 End stage renal disease: Secondary | ICD-10-CM | POA: Diagnosis not present

## 2015-05-07 DIAGNOSIS — N186 End stage renal disease: Secondary | ICD-10-CM | POA: Diagnosis not present

## 2015-05-07 DIAGNOSIS — N2581 Secondary hyperparathyroidism of renal origin: Secondary | ICD-10-CM | POA: Diagnosis not present

## 2015-05-09 DIAGNOSIS — N2581 Secondary hyperparathyroidism of renal origin: Secondary | ICD-10-CM | POA: Diagnosis not present

## 2015-05-09 DIAGNOSIS — N186 End stage renal disease: Secondary | ICD-10-CM | POA: Diagnosis not present

## 2015-05-12 DIAGNOSIS — N186 End stage renal disease: Secondary | ICD-10-CM | POA: Diagnosis not present

## 2015-05-12 DIAGNOSIS — N2581 Secondary hyperparathyroidism of renal origin: Secondary | ICD-10-CM | POA: Diagnosis not present

## 2015-05-14 DIAGNOSIS — N2581 Secondary hyperparathyroidism of renal origin: Secondary | ICD-10-CM | POA: Diagnosis not present

## 2015-05-14 DIAGNOSIS — N186 End stage renal disease: Secondary | ICD-10-CM | POA: Diagnosis not present

## 2015-05-16 DIAGNOSIS — N186 End stage renal disease: Secondary | ICD-10-CM | POA: Diagnosis not present

## 2015-05-16 DIAGNOSIS — N2581 Secondary hyperparathyroidism of renal origin: Secondary | ICD-10-CM | POA: Diagnosis not present

## 2015-05-17 DIAGNOSIS — N186 End stage renal disease: Secondary | ICD-10-CM | POA: Diagnosis not present

## 2015-05-17 DIAGNOSIS — Z992 Dependence on renal dialysis: Secondary | ICD-10-CM | POA: Diagnosis not present

## 2015-05-17 DIAGNOSIS — N033 Chronic nephritic syndrome with diffuse mesangial proliferative glomerulonephritis: Secondary | ICD-10-CM | POA: Diagnosis not present

## 2015-05-18 ENCOUNTER — Ambulatory Visit (INDEPENDENT_AMBULATORY_CARE_PROVIDER_SITE_OTHER)
Admission: RE | Admit: 2015-05-18 | Discharge: 2015-05-18 | Disposition: A | Discharge status: Home / Self Care | Payer: Medicare Other | Source: Ambulatory Visit | Attending: Internal Medicine | Admitting: Internal Medicine

## 2015-05-18 ENCOUNTER — Encounter: Payer: Self-pay | Admitting: Internal Medicine

## 2015-05-18 ENCOUNTER — Ambulatory Visit (INDEPENDENT_AMBULATORY_CARE_PROVIDER_SITE_OTHER): Payer: Medicare Other | Admitting: Internal Medicine

## 2015-05-18 VITALS — BP 120/62 | HR 64 | Temp 98.1°F | Resp 12 | Ht 67.0 in | Wt 142.0 lb

## 2015-05-18 DIAGNOSIS — M79642 Pain in left hand: Secondary | ICD-10-CM | POA: Diagnosis not present

## 2015-05-18 DIAGNOSIS — M25532 Pain in left wrist: Secondary | ICD-10-CM | POA: Diagnosis not present

## 2015-05-18 DIAGNOSIS — R200 Anesthesia of skin: Secondary | ICD-10-CM | POA: Insufficient documentation

## 2015-05-18 DIAGNOSIS — R202 Paresthesia of skin: Secondary | ICD-10-CM

## 2015-05-18 MED ORDER — ALPRAZOLAM 0.25 MG PO TABS: 0.2500 mg | ORAL_TABLET | Freq: Every day | ORAL | Status: DC | PRN

## 2015-05-18 NOTE — Progress Notes (Signed)
Pre visit review using our clinic review tool, if applicable. No additional management support is needed unless otherwise documented below in the visit note. 

## 2015-05-18 NOTE — Patient Instructions (Signed)
It is okay to use the xanax when needed and we have sent in a refill of that.   It is all right to use tylenol for the pain in your hands and make sure to keep them busy and stretch them out some.   We will get the x-ray of the left hand to see if there is arthritis.

## 2015-05-18 NOTE — Progress Notes (Signed)
   Subjective:    Patient ID: [name], female    DOB: [birth year], 55 y.o.   MRN: [id number]  HPI The patient is a 55 YO female coming in for left hand pain. She has had it for some time but now is very sensitive to any bumps to the thumb area. Denies known injury. Fingers overall are very stiff and sore. She has not tried anything for it as she does not know what would be safe to try. No redness or swelling to the joints.   Review of Systems  Constitutional: Negative for fever, chills, activity change, appetite change, fatigue and unexpected weight change.  Respiratory: Negative for cough, chest tightness, shortness of breath and wheezing.   Cardiovascular: Negative for chest pain, palpitations and leg swelling.  Gastrointestinal: Negative for nausea, abdominal pain, diarrhea, constipation and abdominal distention.  Musculoskeletal: Positive for myalgias and arthralgias. Negative for back pain, joint swelling and gait problem.  Skin: Negative.   Neurological: Negative.       Objective:   Physical Exam  Constitutional: She is oriented to person, place, and time. She appears well-developed and well-nourished.  HENT:  Head: Normocephalic and atraumatic.  Eyes: EOM are normal.  Neck: Normal range of motion.  Cardiovascular: Normal rate and regular rhythm.   Right upper arm access with good thrill and bruit  Pulmonary/Chest: Effort normal and breath sounds normal. No respiratory distress. She has no wheezes. She has no rales.  Abdominal: Soft. She exhibits no distension. There is no tenderness. There is no rebound.  Musculoskeletal: She exhibits tenderness. She exhibits no edema.  Tenderness at the base of the left thumb to palpation and manipulation of the wrist.   Neurological: She is alert and oriented to person, place, and time. Coordination normal.  Skin: Skin is warm and dry.   Filed Vitals:   05/18/15 1033  BP: 120/62  Pulse: 64  Temp: 98.1 F (36.7 C)  TempSrc: Oral    Resp: 12  Height: 5\' 7"  (1.702 m)  Weight: 142 lb (64.411 kg)  SpO2: 98%      Assessment & Plan:

## 2015-05-18 NOTE — Assessment & Plan Note (Signed)
She is advised to try NSAIDS or tylenol for pain. She is okay to use NSAIDS since on dialysis. Checking X-ray left hand with wrist.

## 2015-05-19 DIAGNOSIS — D509 Iron deficiency anemia, unspecified: Secondary | ICD-10-CM | POA: Diagnosis not present

## 2015-05-19 DIAGNOSIS — D631 Anemia in chronic kidney disease: Secondary | ICD-10-CM | POA: Diagnosis not present

## 2015-05-19 DIAGNOSIS — N186 End stage renal disease: Secondary | ICD-10-CM | POA: Diagnosis not present

## 2015-05-19 DIAGNOSIS — N2581 Secondary hyperparathyroidism of renal origin: Secondary | ICD-10-CM | POA: Diagnosis not present

## 2015-05-21 ENCOUNTER — Encounter: Payer: Self-pay | Admitting: Internal Medicine

## 2015-05-21 ENCOUNTER — Ambulatory Visit (INDEPENDENT_AMBULATORY_CARE_PROVIDER_SITE_OTHER): Payer: Medicare Other | Admitting: Internal Medicine

## 2015-05-21 VITALS — BP 130/80 | HR 100 | Temp 98.3°F | Resp 20 | Wt 140.0 lb

## 2015-05-21 DIAGNOSIS — Z992 Dependence on renal dialysis: Secondary | ICD-10-CM

## 2015-05-21 DIAGNOSIS — I10 Essential (primary) hypertension: Secondary | ICD-10-CM

## 2015-05-21 DIAGNOSIS — N186 End stage renal disease: Secondary | ICD-10-CM | POA: Diagnosis not present

## 2015-05-21 DIAGNOSIS — L0201 Cutaneous abscess of face: Secondary | ICD-10-CM

## 2015-05-21 DIAGNOSIS — D509 Iron deficiency anemia, unspecified: Secondary | ICD-10-CM | POA: Diagnosis not present

## 2015-05-21 DIAGNOSIS — D631 Anemia in chronic kidney disease: Secondary | ICD-10-CM | POA: Diagnosis not present

## 2015-05-21 DIAGNOSIS — N2581 Secondary hyperparathyroidism of renal origin: Secondary | ICD-10-CM | POA: Diagnosis not present

## 2015-05-21 MED ORDER — DOXYCYCLINE HYCLATE 100 MG PO TABS: 100.0000 mg | ORAL_TABLET | Freq: Two times a day (BID) | ORAL | Status: DC

## 2015-05-21 NOTE — Progress Notes (Signed)
Pre visit review using our clinic review tool, if applicable. No additional management support is needed unless otherwise documented below in the visit note. 

## 2015-05-21 NOTE — Patient Instructions (Signed)
Please take all new medication as prescribed - the antibiotic  You will be contacted regarding the referral for: ENT  Please continue all other medications as before, and refills have been done if requested.  Please have the pharmacy call with any other refills you may need.  Please keep your appointments with your specialists as you may have planned    

## 2015-05-22 NOTE — Progress Notes (Signed)
Subjective:    Patient ID: [name], female    DOB: 1960-06-06, 55 y.o.   MRN: [id number]  HPI  Here with 1-2 days sudden onset moderate right maxillary sinus area localized facial red/tender/swelling with marked pain, HA, feeling warm, nothing else makes better or worse.  No prior hx of same. No sinus congestion, ear pain, ST, cough and Pt denies chest pain, increased sob or doe, wheezing, orthopnea, PND, increased LE swelling, palpitations, dizziness or syncope.  Pt denies new neurological symptoms such as new headache, or facial or extremity weakness or numbness   Pt denies polydipsia, polyuria,  Wondering about an insect bite to start the problem but did not see any insect + hx of ESRD.  No hx of MRSA Past Medical History  Diagnosis Date  . HTN (hypertension)   . Pancreatitis   . Anxiety   . Anemia   . Claustrophobia   . Secondary hyperparathyroidism ([place])     [name] 05/11/2014  . Heart murmur   . Hypothyroidism   . ESRD (end stage renal disease) on dialysis ([place])     "TTS; [facility]" (03/24/2015)  . Glomerulonephritis   . Renal insufficiency    Past Surgical History  Procedure Laterality Date  . Kidney transplant  1997  . Arteriovenous graft placement Right 1990's?  . Dilatation & currettage/hysteroscopy with resectocope N/A 04/17/2013    Procedure: DILATATION & CURETTAGE/HYSTEROSCOPY WITH RESECTOCOPE;  Surgeon: Marvene Staff, MD;  Location: [facility];  Service: Gynecology;  Laterality: N/A;  . Colonoscopy    . Peritoneal catheter insertion    . Parathyroidectomy  03/2000 05/12/2014    w/neck exploration & autotransplantation/notes 06/02/2010; w/neck exploration  . Dilation and curettage of uterus    . Peritoneal catheter removal  08/1999    [name] 06/02/2010  . Thrombectomy and revision of arterioventous (av) goretex  graft Right 01/2002; 11/2003; 01/2004; 08/07/2004; 08/10/2004; 11/13/2005    [name] 5/1/2012Marland Kitchen [name] 5/16/2012Marland Kitchen [name] 5/16/2012Marland Kitchen [name] 5/16/2012Marland Kitchen  [name] 06/02/2010; [name] 06/02/2010  . Av fistula placement Left 09/2000    upper arm/notes 06/02/2010  . Thrombectomy Left 02/2002    fistula/notes 06/02/2010  . Arteriovenous graft placement Left 07/2002    upper arm/notes 06/02/2010  . Thrombectomy and revision of arterioventous (av) goretex  graft Left 08/23/2002; 09/13/2002; 07/08/2003    [name] 5/16/2012Marland Kitchen [name] 5/16/2012Marland Kitchen [name] 06/02/2010  . Parathyroidectomy N/A 05/12/2014    Procedure: PARATHYROIDECTOMY AND NECK EXPLORATION;  Surgeon: Armandina Gemma, MD;  Location: [facility];  Service: General;  Laterality: N/A;  . Breast biopsy Left   . Arteriovenous graft placement Right 07/2003    upper arm/notes 06/02/2010  . Thrombectomy / arteriovenous graft revision  11/2003; 01/2004; 08/07/2005; 08/10/2005; 10/2005    [name] 06/02/2010  . Retinal detachment surgery Left     reports that she has been smoking Cigarettes.  She has a 3.6 pack-year smoking history. She has never used smokeless tobacco. She reports that she does not drink alcohol or use illicit drugs. family history includes Diabetes in her maternal aunt and maternal uncle. She was adopted. Allergies  Allergen Reactions  . Ace Inhibitors Hives and Nausea And Vomiting  . Codeine Hives  . Penicillins Hives    Has patient had a PCN reaction causing immediate rash, facial/tongue/throat swelling, SOB or lightheadedness with hypotension: yes Has patient had a PCN reaction causing severe rash involving mucus membranes or skin necrosis: Non Has patient had a PCN reaction that required hospitalization No Has patient had a PCN  reaction occurring within the last 10 years: No If all of the above answers are "NO", then may proceed with Cephalosporin   Current Outpatient Prescriptions on File Prior to Visit  Medication Sig Dispense Refill  . ALPRAZolam (XANAX) 0.25 MG tablet Take 1 tablet (0.25 mg total) by mouth daily as needed for anxiety. 30 tablet 1  . metoprolol tartrate (LOPRESSOR) 25 MG tablet Take 25  mg by mouth daily as needed (as instructed by MD). Reported on 05/18/2015     No current facility-administered medications on file prior to visit.   Review of Systems  Constitutional: Negative for unusual diaphoresis or night sweats HENT: Negative for ear swelling or discharge Eyes: Negative for worsening visual haziness  Respiratory: Negative for choking and stridor.   Gastrointestinal: Negative for distension or worsening eructation Genitourinary: Negative for retention or change in urine volume.  Musculoskeletal: Negative for other MSK pain or swelling Skin: Negative for color change and worsening wound Neurological: Negative for tremors and numbness other than noted  Psychiatric/Behavioral: Negative for decreased concentration or agitation other than above       Objective:   Physical Exam BP 130/80 mmHg  Pulse 100  Temp(Src) 98.3 F (36.8 C) (Oral)  Resp 20  Wt 140 lb (63.504 kg)  SpO2 99% VS noted, mild ill Constitutional: Pt appears in no apparent distress HENT: Head: NCAT.  Right Ear: External ear normal.  Left Ear: External ear normal.  Eyes: . Pupils are equal, round, and reactive to light. Conjunctivae and EOM are normal Neck: Normal range of motion. Neck supple.  Cardiovascular: Normal rate and regular rhythm.   Pulmonary/Chest: Effort normal and breath sounds without rales or wheezing.  Abd:  Soft, NT, ND, + BS Neurological: Pt is alert. Not confused , motor grossly intact Skin: Skin is warm. No rash, no LE edema but right maxillary sinus area with 1.5 cm indurated/fluctuant mass with red/tender/swelling, no overt insect bite or cyst seen, no drainage Psychiatric: Pt behavior is normal. No agitation.     Assessment & Plan:

## 2015-05-22 NOTE — Assessment & Plan Note (Signed)
stable overall by history and exam, recent data reviewed with pt, and pt to continue medical treatment as before,  to f/u any worsening symptoms or concerns BP Readings from Last 3 Encounters:  05/21/15 130/80  05/18/15 120/62  04/01/15 120/80

## 2015-05-22 NOTE — Assessment & Plan Note (Signed)
Mod sized, recent onset, etiology not clear but cant r/o MRSA due to [employer], for antibx course, refer ENT urgent,  to f/u any worsening symptoms or concerns

## 2015-05-22 NOTE — Assessment & Plan Note (Signed)
C/w reduced immune efficiency, to cont HD t-th-sat

## 2015-05-23 DIAGNOSIS — N186 End stage renal disease: Secondary | ICD-10-CM | POA: Diagnosis not present

## 2015-05-23 DIAGNOSIS — D509 Iron deficiency anemia, unspecified: Secondary | ICD-10-CM | POA: Diagnosis not present

## 2015-05-23 DIAGNOSIS — N2581 Secondary hyperparathyroidism of renal origin: Secondary | ICD-10-CM | POA: Diagnosis not present

## 2015-05-23 DIAGNOSIS — D631 Anemia in chronic kidney disease: Secondary | ICD-10-CM | POA: Diagnosis not present

## 2015-05-25 ENCOUNTER — Ambulatory Visit: Payer: Medicare Other | Admitting: Internal Medicine

## 2015-05-25 DIAGNOSIS — L0203 Carbuncle of face: Secondary | ICD-10-CM | POA: Diagnosis not present

## 2015-05-26 DIAGNOSIS — N2581 Secondary hyperparathyroidism of renal origin: Secondary | ICD-10-CM | POA: Diagnosis not present

## 2015-05-26 DIAGNOSIS — N186 End stage renal disease: Secondary | ICD-10-CM | POA: Diagnosis not present

## 2015-05-26 DIAGNOSIS — D509 Iron deficiency anemia, unspecified: Secondary | ICD-10-CM | POA: Diagnosis not present

## 2015-05-26 DIAGNOSIS — D631 Anemia in chronic kidney disease: Secondary | ICD-10-CM | POA: Diagnosis not present

## 2015-05-28 DIAGNOSIS — N2581 Secondary hyperparathyroidism of renal origin: Secondary | ICD-10-CM | POA: Diagnosis not present

## 2015-05-28 DIAGNOSIS — N186 End stage renal disease: Secondary | ICD-10-CM | POA: Diagnosis not present

## 2015-05-28 DIAGNOSIS — D631 Anemia in chronic kidney disease: Secondary | ICD-10-CM | POA: Diagnosis not present

## 2015-05-28 DIAGNOSIS — D509 Iron deficiency anemia, unspecified: Secondary | ICD-10-CM | POA: Diagnosis not present

## 2015-05-29 DIAGNOSIS — D631 Anemia in chronic kidney disease: Secondary | ICD-10-CM | POA: Diagnosis not present

## 2015-05-29 DIAGNOSIS — N2581 Secondary hyperparathyroidism of renal origin: Secondary | ICD-10-CM | POA: Diagnosis not present

## 2015-05-29 DIAGNOSIS — D509 Iron deficiency anemia, unspecified: Secondary | ICD-10-CM | POA: Diagnosis not present

## 2015-05-29 DIAGNOSIS — N186 End stage renal disease: Secondary | ICD-10-CM | POA: Diagnosis not present

## 2015-06-02 DIAGNOSIS — D631 Anemia in chronic kidney disease: Secondary | ICD-10-CM | POA: Diagnosis not present

## 2015-06-02 DIAGNOSIS — D509 Iron deficiency anemia, unspecified: Secondary | ICD-10-CM | POA: Diagnosis not present

## 2015-06-02 DIAGNOSIS — N186 End stage renal disease: Secondary | ICD-10-CM | POA: Diagnosis not present

## 2015-06-02 DIAGNOSIS — N2581 Secondary hyperparathyroidism of renal origin: Secondary | ICD-10-CM | POA: Diagnosis not present

## 2015-06-04 DIAGNOSIS — D631 Anemia in chronic kidney disease: Secondary | ICD-10-CM | POA: Diagnosis not present

## 2015-06-04 DIAGNOSIS — N186 End stage renal disease: Secondary | ICD-10-CM | POA: Diagnosis not present

## 2015-06-04 DIAGNOSIS — D509 Iron deficiency anemia, unspecified: Secondary | ICD-10-CM | POA: Diagnosis not present

## 2015-06-04 DIAGNOSIS — N2581 Secondary hyperparathyroidism of renal origin: Secondary | ICD-10-CM | POA: Diagnosis not present

## 2015-06-06 DIAGNOSIS — N2581 Secondary hyperparathyroidism of renal origin: Secondary | ICD-10-CM | POA: Diagnosis not present

## 2015-06-06 DIAGNOSIS — D631 Anemia in chronic kidney disease: Secondary | ICD-10-CM | POA: Diagnosis not present

## 2015-06-06 DIAGNOSIS — D509 Iron deficiency anemia, unspecified: Secondary | ICD-10-CM | POA: Diagnosis not present

## 2015-06-06 DIAGNOSIS — N186 End stage renal disease: Secondary | ICD-10-CM | POA: Diagnosis not present

## 2015-06-08 DIAGNOSIS — L0203 Carbuncle of face: Secondary | ICD-10-CM | POA: Diagnosis not present

## 2015-06-09 DIAGNOSIS — D509 Iron deficiency anemia, unspecified: Secondary | ICD-10-CM | POA: Diagnosis not present

## 2015-06-09 DIAGNOSIS — N186 End stage renal disease: Secondary | ICD-10-CM | POA: Diagnosis not present

## 2015-06-09 DIAGNOSIS — N2581 Secondary hyperparathyroidism of renal origin: Secondary | ICD-10-CM | POA: Diagnosis not present

## 2015-06-09 DIAGNOSIS — D631 Anemia in chronic kidney disease: Secondary | ICD-10-CM | POA: Diagnosis not present

## 2015-06-11 DIAGNOSIS — D631 Anemia in chronic kidney disease: Secondary | ICD-10-CM | POA: Diagnosis not present

## 2015-06-11 DIAGNOSIS — N186 End stage renal disease: Secondary | ICD-10-CM | POA: Diagnosis not present

## 2015-06-11 DIAGNOSIS — N2581 Secondary hyperparathyroidism of renal origin: Secondary | ICD-10-CM | POA: Diagnosis not present

## 2015-06-11 DIAGNOSIS — D509 Iron deficiency anemia, unspecified: Secondary | ICD-10-CM | POA: Diagnosis not present

## 2015-06-13 DIAGNOSIS — D631 Anemia in chronic kidney disease: Secondary | ICD-10-CM | POA: Diagnosis not present

## 2015-06-13 DIAGNOSIS — N186 End stage renal disease: Secondary | ICD-10-CM | POA: Diagnosis not present

## 2015-06-13 DIAGNOSIS — N2581 Secondary hyperparathyroidism of renal origin: Secondary | ICD-10-CM | POA: Diagnosis not present

## 2015-06-13 DIAGNOSIS — D509 Iron deficiency anemia, unspecified: Secondary | ICD-10-CM | POA: Diagnosis not present

## 2015-06-16 DIAGNOSIS — D631 Anemia in chronic kidney disease: Secondary | ICD-10-CM | POA: Diagnosis not present

## 2015-06-16 DIAGNOSIS — N186 End stage renal disease: Secondary | ICD-10-CM | POA: Diagnosis not present

## 2015-06-16 DIAGNOSIS — N2581 Secondary hyperparathyroidism of renal origin: Secondary | ICD-10-CM | POA: Diagnosis not present

## 2015-06-16 DIAGNOSIS — D509 Iron deficiency anemia, unspecified: Secondary | ICD-10-CM | POA: Diagnosis not present

## 2015-06-17 DIAGNOSIS — I871 Compression of vein: Secondary | ICD-10-CM | POA: Diagnosis not present

## 2015-06-17 DIAGNOSIS — Z992 Dependence on renal dialysis: Secondary | ICD-10-CM | POA: Diagnosis not present

## 2015-06-17 DIAGNOSIS — N186 End stage renal disease: Secondary | ICD-10-CM | POA: Diagnosis not present

## 2015-06-17 DIAGNOSIS — N033 Chronic nephritic syndrome with diffuse mesangial proliferative glomerulonephritis: Secondary | ICD-10-CM | POA: Diagnosis not present

## 2015-06-17 DIAGNOSIS — T82858D Stenosis of vascular prosthetic devices, implants and grafts, subsequent encounter: Secondary | ICD-10-CM | POA: Diagnosis not present

## 2015-06-18 DIAGNOSIS — N186 End stage renal disease: Secondary | ICD-10-CM | POA: Diagnosis not present

## 2015-06-18 DIAGNOSIS — D631 Anemia in chronic kidney disease: Secondary | ICD-10-CM | POA: Diagnosis not present

## 2015-06-18 DIAGNOSIS — N2581 Secondary hyperparathyroidism of renal origin: Secondary | ICD-10-CM | POA: Diagnosis not present

## 2015-06-18 DIAGNOSIS — D509 Iron deficiency anemia, unspecified: Secondary | ICD-10-CM | POA: Diagnosis not present

## 2015-06-20 DIAGNOSIS — D631 Anemia in chronic kidney disease: Secondary | ICD-10-CM | POA: Diagnosis not present

## 2015-06-20 DIAGNOSIS — N2581 Secondary hyperparathyroidism of renal origin: Secondary | ICD-10-CM | POA: Diagnosis not present

## 2015-06-20 DIAGNOSIS — N186 End stage renal disease: Secondary | ICD-10-CM | POA: Diagnosis not present

## 2015-06-20 DIAGNOSIS — D509 Iron deficiency anemia, unspecified: Secondary | ICD-10-CM | POA: Diagnosis not present

## 2015-06-23 DIAGNOSIS — D509 Iron deficiency anemia, unspecified: Secondary | ICD-10-CM | POA: Diagnosis not present

## 2015-06-23 DIAGNOSIS — N2581 Secondary hyperparathyroidism of renal origin: Secondary | ICD-10-CM | POA: Diagnosis not present

## 2015-06-23 DIAGNOSIS — D631 Anemia in chronic kidney disease: Secondary | ICD-10-CM | POA: Diagnosis not present

## 2015-06-23 DIAGNOSIS — N186 End stage renal disease: Secondary | ICD-10-CM | POA: Diagnosis not present

## 2015-06-25 DIAGNOSIS — N2581 Secondary hyperparathyroidism of renal origin: Secondary | ICD-10-CM | POA: Diagnosis not present

## 2015-06-25 DIAGNOSIS — D631 Anemia in chronic kidney disease: Secondary | ICD-10-CM | POA: Diagnosis not present

## 2015-06-25 DIAGNOSIS — D509 Iron deficiency anemia, unspecified: Secondary | ICD-10-CM | POA: Diagnosis not present

## 2015-06-25 DIAGNOSIS — N186 End stage renal disease: Secondary | ICD-10-CM | POA: Diagnosis not present

## 2015-06-27 DIAGNOSIS — D631 Anemia in chronic kidney disease: Secondary | ICD-10-CM | POA: Diagnosis not present

## 2015-06-27 DIAGNOSIS — D509 Iron deficiency anemia, unspecified: Secondary | ICD-10-CM | POA: Diagnosis not present

## 2015-06-27 DIAGNOSIS — N2581 Secondary hyperparathyroidism of renal origin: Secondary | ICD-10-CM | POA: Diagnosis not present

## 2015-06-27 DIAGNOSIS — N186 End stage renal disease: Secondary | ICD-10-CM | POA: Diagnosis not present

## 2015-06-30 DIAGNOSIS — D631 Anemia in chronic kidney disease: Secondary | ICD-10-CM | POA: Diagnosis not present

## 2015-06-30 DIAGNOSIS — N2581 Secondary hyperparathyroidism of renal origin: Secondary | ICD-10-CM | POA: Diagnosis not present

## 2015-06-30 DIAGNOSIS — N186 End stage renal disease: Secondary | ICD-10-CM | POA: Diagnosis not present

## 2015-06-30 DIAGNOSIS — D509 Iron deficiency anemia, unspecified: Secondary | ICD-10-CM | POA: Diagnosis not present

## 2015-07-02 DIAGNOSIS — D631 Anemia in chronic kidney disease: Secondary | ICD-10-CM | POA: Diagnosis not present

## 2015-07-02 DIAGNOSIS — D509 Iron deficiency anemia, unspecified: Secondary | ICD-10-CM | POA: Diagnosis not present

## 2015-07-02 DIAGNOSIS — N2581 Secondary hyperparathyroidism of renal origin: Secondary | ICD-10-CM | POA: Diagnosis not present

## 2015-07-02 DIAGNOSIS — N186 End stage renal disease: Secondary | ICD-10-CM | POA: Diagnosis not present

## 2015-07-04 DIAGNOSIS — N2581 Secondary hyperparathyroidism of renal origin: Secondary | ICD-10-CM | POA: Diagnosis not present

## 2015-07-04 DIAGNOSIS — D509 Iron deficiency anemia, unspecified: Secondary | ICD-10-CM | POA: Diagnosis not present

## 2015-07-04 DIAGNOSIS — N186 End stage renal disease: Secondary | ICD-10-CM | POA: Diagnosis not present

## 2015-07-04 DIAGNOSIS — D631 Anemia in chronic kidney disease: Secondary | ICD-10-CM | POA: Diagnosis not present

## 2015-07-07 DIAGNOSIS — N2581 Secondary hyperparathyroidism of renal origin: Secondary | ICD-10-CM | POA: Diagnosis not present

## 2015-07-07 DIAGNOSIS — N186 End stage renal disease: Secondary | ICD-10-CM | POA: Diagnosis not present

## 2015-07-07 DIAGNOSIS — D631 Anemia in chronic kidney disease: Secondary | ICD-10-CM | POA: Diagnosis not present

## 2015-07-07 DIAGNOSIS — D509 Iron deficiency anemia, unspecified: Secondary | ICD-10-CM | POA: Diagnosis not present

## 2015-07-09 DIAGNOSIS — D509 Iron deficiency anemia, unspecified: Secondary | ICD-10-CM | POA: Diagnosis not present

## 2015-07-09 DIAGNOSIS — D631 Anemia in chronic kidney disease: Secondary | ICD-10-CM | POA: Diagnosis not present

## 2015-07-09 DIAGNOSIS — N186 End stage renal disease: Secondary | ICD-10-CM | POA: Diagnosis not present

## 2015-07-09 DIAGNOSIS — N2581 Secondary hyperparathyroidism of renal origin: Secondary | ICD-10-CM | POA: Diagnosis not present

## 2015-07-11 DIAGNOSIS — D631 Anemia in chronic kidney disease: Secondary | ICD-10-CM | POA: Diagnosis not present

## 2015-07-11 DIAGNOSIS — D509 Iron deficiency anemia, unspecified: Secondary | ICD-10-CM | POA: Diagnosis not present

## 2015-07-11 DIAGNOSIS — N2581 Secondary hyperparathyroidism of renal origin: Secondary | ICD-10-CM | POA: Diagnosis not present

## 2015-07-11 DIAGNOSIS — N186 End stage renal disease: Secondary | ICD-10-CM | POA: Diagnosis not present

## 2015-07-13 DIAGNOSIS — L0203 Carbuncle of face: Secondary | ICD-10-CM | POA: Diagnosis not present

## 2015-07-14 DIAGNOSIS — D631 Anemia in chronic kidney disease: Secondary | ICD-10-CM | POA: Diagnosis not present

## 2015-07-14 DIAGNOSIS — N186 End stage renal disease: Secondary | ICD-10-CM | POA: Diagnosis not present

## 2015-07-14 DIAGNOSIS — N2581 Secondary hyperparathyroidism of renal origin: Secondary | ICD-10-CM | POA: Diagnosis not present

## 2015-07-14 DIAGNOSIS — D509 Iron deficiency anemia, unspecified: Secondary | ICD-10-CM | POA: Diagnosis not present

## 2015-07-16 DIAGNOSIS — D631 Anemia in chronic kidney disease: Secondary | ICD-10-CM | POA: Diagnosis not present

## 2015-07-16 DIAGNOSIS — N2581 Secondary hyperparathyroidism of renal origin: Secondary | ICD-10-CM | POA: Diagnosis not present

## 2015-07-16 DIAGNOSIS — N186 End stage renal disease: Secondary | ICD-10-CM | POA: Diagnosis not present

## 2015-07-16 DIAGNOSIS — D509 Iron deficiency anemia, unspecified: Secondary | ICD-10-CM | POA: Diagnosis not present

## 2015-07-17 DIAGNOSIS — N033 Chronic nephritic syndrome with diffuse mesangial proliferative glomerulonephritis: Secondary | ICD-10-CM | POA: Diagnosis not present

## 2015-07-17 DIAGNOSIS — Z992 Dependence on renal dialysis: Secondary | ICD-10-CM | POA: Diagnosis not present

## 2015-07-17 DIAGNOSIS — N186 End stage renal disease: Secondary | ICD-10-CM | POA: Diagnosis not present

## 2015-07-18 DIAGNOSIS — N2581 Secondary hyperparathyroidism of renal origin: Secondary | ICD-10-CM | POA: Diagnosis not present

## 2015-07-18 DIAGNOSIS — N186 End stage renal disease: Secondary | ICD-10-CM | POA: Diagnosis not present

## 2015-07-18 DIAGNOSIS — D509 Iron deficiency anemia, unspecified: Secondary | ICD-10-CM | POA: Diagnosis not present

## 2015-07-18 DIAGNOSIS — D631 Anemia in chronic kidney disease: Secondary | ICD-10-CM | POA: Diagnosis not present

## 2015-07-21 DIAGNOSIS — D631 Anemia in chronic kidney disease: Secondary | ICD-10-CM | POA: Diagnosis not present

## 2015-07-21 DIAGNOSIS — D509 Iron deficiency anemia, unspecified: Secondary | ICD-10-CM | POA: Diagnosis not present

## 2015-07-21 DIAGNOSIS — N186 End stage renal disease: Secondary | ICD-10-CM | POA: Diagnosis not present

## 2015-07-21 DIAGNOSIS — N2581 Secondary hyperparathyroidism of renal origin: Secondary | ICD-10-CM | POA: Diagnosis not present

## 2015-07-23 DIAGNOSIS — N2581 Secondary hyperparathyroidism of renal origin: Secondary | ICD-10-CM | POA: Diagnosis not present

## 2015-07-23 DIAGNOSIS — D509 Iron deficiency anemia, unspecified: Secondary | ICD-10-CM | POA: Diagnosis not present

## 2015-07-23 DIAGNOSIS — N186 End stage renal disease: Secondary | ICD-10-CM | POA: Diagnosis not present

## 2015-07-23 DIAGNOSIS — D631 Anemia in chronic kidney disease: Secondary | ICD-10-CM | POA: Diagnosis not present

## 2015-07-25 DIAGNOSIS — N2581 Secondary hyperparathyroidism of renal origin: Secondary | ICD-10-CM | POA: Diagnosis not present

## 2015-07-25 DIAGNOSIS — D509 Iron deficiency anemia, unspecified: Secondary | ICD-10-CM | POA: Diagnosis not present

## 2015-07-25 DIAGNOSIS — N186 End stage renal disease: Secondary | ICD-10-CM | POA: Diagnosis not present

## 2015-07-25 DIAGNOSIS — D631 Anemia in chronic kidney disease: Secondary | ICD-10-CM | POA: Diagnosis not present

## 2015-07-27 DIAGNOSIS — H25093 Other age-related incipient cataract, bilateral: Secondary | ICD-10-CM | POA: Diagnosis not present

## 2015-07-28 DIAGNOSIS — N186 End stage renal disease: Secondary | ICD-10-CM | POA: Diagnosis not present

## 2015-07-28 DIAGNOSIS — D509 Iron deficiency anemia, unspecified: Secondary | ICD-10-CM | POA: Diagnosis not present

## 2015-07-28 DIAGNOSIS — D631 Anemia in chronic kidney disease: Secondary | ICD-10-CM | POA: Diagnosis not present

## 2015-07-28 DIAGNOSIS — N2581 Secondary hyperparathyroidism of renal origin: Secondary | ICD-10-CM | POA: Diagnosis not present

## 2015-07-30 DIAGNOSIS — D631 Anemia in chronic kidney disease: Secondary | ICD-10-CM | POA: Diagnosis not present

## 2015-07-30 DIAGNOSIS — N186 End stage renal disease: Secondary | ICD-10-CM | POA: Diagnosis not present

## 2015-07-30 DIAGNOSIS — N2581 Secondary hyperparathyroidism of renal origin: Secondary | ICD-10-CM | POA: Diagnosis not present

## 2015-07-30 DIAGNOSIS — D509 Iron deficiency anemia, unspecified: Secondary | ICD-10-CM | POA: Diagnosis not present

## 2015-08-01 DIAGNOSIS — D631 Anemia in chronic kidney disease: Secondary | ICD-10-CM | POA: Diagnosis not present

## 2015-08-01 DIAGNOSIS — N186 End stage renal disease: Secondary | ICD-10-CM | POA: Diagnosis not present

## 2015-08-01 DIAGNOSIS — N2581 Secondary hyperparathyroidism of renal origin: Secondary | ICD-10-CM | POA: Diagnosis not present

## 2015-08-01 DIAGNOSIS — D509 Iron deficiency anemia, unspecified: Secondary | ICD-10-CM | POA: Diagnosis not present

## 2015-08-04 DIAGNOSIS — N186 End stage renal disease: Secondary | ICD-10-CM | POA: Diagnosis not present

## 2015-08-04 DIAGNOSIS — D631 Anemia in chronic kidney disease: Secondary | ICD-10-CM | POA: Diagnosis not present

## 2015-08-04 DIAGNOSIS — D509 Iron deficiency anemia, unspecified: Secondary | ICD-10-CM | POA: Diagnosis not present

## 2015-08-04 DIAGNOSIS — N2581 Secondary hyperparathyroidism of renal origin: Secondary | ICD-10-CM | POA: Diagnosis not present

## 2015-08-06 DIAGNOSIS — N186 End stage renal disease: Secondary | ICD-10-CM | POA: Diagnosis not present

## 2015-08-06 DIAGNOSIS — D631 Anemia in chronic kidney disease: Secondary | ICD-10-CM | POA: Diagnosis not present

## 2015-08-06 DIAGNOSIS — N2581 Secondary hyperparathyroidism of renal origin: Secondary | ICD-10-CM | POA: Diagnosis not present

## 2015-08-06 DIAGNOSIS — D509 Iron deficiency anemia, unspecified: Secondary | ICD-10-CM | POA: Diagnosis not present

## 2015-08-08 DIAGNOSIS — N186 End stage renal disease: Secondary | ICD-10-CM | POA: Diagnosis not present

## 2015-08-08 DIAGNOSIS — D631 Anemia in chronic kidney disease: Secondary | ICD-10-CM | POA: Diagnosis not present

## 2015-08-08 DIAGNOSIS — N2581 Secondary hyperparathyroidism of renal origin: Secondary | ICD-10-CM | POA: Diagnosis not present

## 2015-08-08 DIAGNOSIS — D509 Iron deficiency anemia, unspecified: Secondary | ICD-10-CM | POA: Diagnosis not present

## 2015-08-11 DIAGNOSIS — N186 End stage renal disease: Secondary | ICD-10-CM | POA: Diagnosis not present

## 2015-08-11 DIAGNOSIS — N2581 Secondary hyperparathyroidism of renal origin: Secondary | ICD-10-CM | POA: Diagnosis not present

## 2015-08-13 DIAGNOSIS — N186 End stage renal disease: Secondary | ICD-10-CM | POA: Diagnosis not present

## 2015-08-13 DIAGNOSIS — N2581 Secondary hyperparathyroidism of renal origin: Secondary | ICD-10-CM | POA: Diagnosis not present

## 2015-08-15 DIAGNOSIS — N186 End stage renal disease: Secondary | ICD-10-CM | POA: Diagnosis not present

## 2015-08-15 DIAGNOSIS — N2581 Secondary hyperparathyroidism of renal origin: Secondary | ICD-10-CM | POA: Diagnosis not present

## 2015-08-17 DIAGNOSIS — N033 Chronic nephritic syndrome with diffuse mesangial proliferative glomerulonephritis: Secondary | ICD-10-CM | POA: Diagnosis not present

## 2015-08-17 DIAGNOSIS — Z992 Dependence on renal dialysis: Secondary | ICD-10-CM | POA: Diagnosis not present

## 2015-08-17 DIAGNOSIS — N186 End stage renal disease: Secondary | ICD-10-CM | POA: Diagnosis not present

## 2015-08-18 DIAGNOSIS — N2581 Secondary hyperparathyroidism of renal origin: Secondary | ICD-10-CM | POA: Diagnosis not present

## 2015-08-18 DIAGNOSIS — N186 End stage renal disease: Secondary | ICD-10-CM | POA: Diagnosis not present

## 2015-08-20 DIAGNOSIS — N186 End stage renal disease: Secondary | ICD-10-CM | POA: Diagnosis not present

## 2015-08-20 DIAGNOSIS — D631 Anemia in chronic kidney disease: Secondary | ICD-10-CM | POA: Diagnosis not present

## 2015-08-20 DIAGNOSIS — D509 Iron deficiency anemia, unspecified: Secondary | ICD-10-CM | POA: Diagnosis not present

## 2015-08-20 DIAGNOSIS — N2581 Secondary hyperparathyroidism of renal origin: Secondary | ICD-10-CM | POA: Diagnosis not present

## 2015-08-22 DIAGNOSIS — N2581 Secondary hyperparathyroidism of renal origin: Secondary | ICD-10-CM | POA: Diagnosis not present

## 2015-08-22 DIAGNOSIS — D509 Iron deficiency anemia, unspecified: Secondary | ICD-10-CM | POA: Diagnosis not present

## 2015-08-22 DIAGNOSIS — N186 End stage renal disease: Secondary | ICD-10-CM | POA: Diagnosis not present

## 2015-08-22 DIAGNOSIS — D631 Anemia in chronic kidney disease: Secondary | ICD-10-CM | POA: Diagnosis not present

## 2015-08-25 DIAGNOSIS — N186 End stage renal disease: Secondary | ICD-10-CM | POA: Diagnosis not present

## 2015-08-25 DIAGNOSIS — N2581 Secondary hyperparathyroidism of renal origin: Secondary | ICD-10-CM | POA: Diagnosis not present

## 2015-08-25 DIAGNOSIS — D631 Anemia in chronic kidney disease: Secondary | ICD-10-CM | POA: Diagnosis not present

## 2015-08-25 DIAGNOSIS — D509 Iron deficiency anemia, unspecified: Secondary | ICD-10-CM | POA: Diagnosis not present

## 2015-08-27 DIAGNOSIS — D631 Anemia in chronic kidney disease: Secondary | ICD-10-CM | POA: Diagnosis not present

## 2015-08-27 DIAGNOSIS — D509 Iron deficiency anemia, unspecified: Secondary | ICD-10-CM | POA: Diagnosis not present

## 2015-08-27 DIAGNOSIS — N2581 Secondary hyperparathyroidism of renal origin: Secondary | ICD-10-CM | POA: Diagnosis not present

## 2015-08-27 DIAGNOSIS — N186 End stage renal disease: Secondary | ICD-10-CM | POA: Diagnosis not present

## 2015-08-29 DIAGNOSIS — D631 Anemia in chronic kidney disease: Secondary | ICD-10-CM | POA: Diagnosis not present

## 2015-08-29 DIAGNOSIS — D509 Iron deficiency anemia, unspecified: Secondary | ICD-10-CM | POA: Diagnosis not present

## 2015-08-29 DIAGNOSIS — N2581 Secondary hyperparathyroidism of renal origin: Secondary | ICD-10-CM | POA: Diagnosis not present

## 2015-08-29 DIAGNOSIS — N186 End stage renal disease: Secondary | ICD-10-CM | POA: Diagnosis not present

## 2015-09-01 DIAGNOSIS — N186 End stage renal disease: Secondary | ICD-10-CM | POA: Diagnosis not present

## 2015-09-01 DIAGNOSIS — D631 Anemia in chronic kidney disease: Secondary | ICD-10-CM | POA: Diagnosis not present

## 2015-09-01 DIAGNOSIS — D509 Iron deficiency anemia, unspecified: Secondary | ICD-10-CM | POA: Diagnosis not present

## 2015-09-01 DIAGNOSIS — N2581 Secondary hyperparathyroidism of renal origin: Secondary | ICD-10-CM | POA: Diagnosis not present

## 2015-09-02 DIAGNOSIS — T82858D Stenosis of vascular prosthetic devices, implants and grafts, subsequent encounter: Secondary | ICD-10-CM | POA: Diagnosis not present

## 2015-09-02 DIAGNOSIS — I871 Compression of vein: Secondary | ICD-10-CM | POA: Diagnosis not present

## 2015-09-02 DIAGNOSIS — Z992 Dependence on renal dialysis: Secondary | ICD-10-CM | POA: Diagnosis not present

## 2015-09-02 DIAGNOSIS — N186 End stage renal disease: Secondary | ICD-10-CM | POA: Diagnosis not present

## 2015-09-03 DIAGNOSIS — N186 End stage renal disease: Secondary | ICD-10-CM | POA: Diagnosis not present

## 2015-09-03 DIAGNOSIS — D509 Iron deficiency anemia, unspecified: Secondary | ICD-10-CM | POA: Diagnosis not present

## 2015-09-03 DIAGNOSIS — D631 Anemia in chronic kidney disease: Secondary | ICD-10-CM | POA: Diagnosis not present

## 2015-09-03 DIAGNOSIS — N2581 Secondary hyperparathyroidism of renal origin: Secondary | ICD-10-CM | POA: Diagnosis not present

## 2015-09-05 DIAGNOSIS — N2581 Secondary hyperparathyroidism of renal origin: Secondary | ICD-10-CM | POA: Diagnosis not present

## 2015-09-05 DIAGNOSIS — D509 Iron deficiency anemia, unspecified: Secondary | ICD-10-CM | POA: Diagnosis not present

## 2015-09-05 DIAGNOSIS — D631 Anemia in chronic kidney disease: Secondary | ICD-10-CM | POA: Diagnosis not present

## 2015-09-05 DIAGNOSIS — N186 End stage renal disease: Secondary | ICD-10-CM | POA: Diagnosis not present

## 2015-09-08 DIAGNOSIS — N186 End stage renal disease: Secondary | ICD-10-CM | POA: Diagnosis not present

## 2015-09-08 DIAGNOSIS — N2581 Secondary hyperparathyroidism of renal origin: Secondary | ICD-10-CM | POA: Diagnosis not present

## 2015-09-08 DIAGNOSIS — D509 Iron deficiency anemia, unspecified: Secondary | ICD-10-CM | POA: Diagnosis not present

## 2015-09-08 DIAGNOSIS — D631 Anemia in chronic kidney disease: Secondary | ICD-10-CM | POA: Diagnosis not present

## 2015-09-10 DIAGNOSIS — D509 Iron deficiency anemia, unspecified: Secondary | ICD-10-CM | POA: Diagnosis not present

## 2015-09-10 DIAGNOSIS — N2581 Secondary hyperparathyroidism of renal origin: Secondary | ICD-10-CM | POA: Diagnosis not present

## 2015-09-10 DIAGNOSIS — D631 Anemia in chronic kidney disease: Secondary | ICD-10-CM | POA: Diagnosis not present

## 2015-09-10 DIAGNOSIS — N186 End stage renal disease: Secondary | ICD-10-CM | POA: Diagnosis not present

## 2015-09-12 DIAGNOSIS — D509 Iron deficiency anemia, unspecified: Secondary | ICD-10-CM | POA: Diagnosis not present

## 2015-09-12 DIAGNOSIS — N2581 Secondary hyperparathyroidism of renal origin: Secondary | ICD-10-CM | POA: Diagnosis not present

## 2015-09-12 DIAGNOSIS — D631 Anemia in chronic kidney disease: Secondary | ICD-10-CM | POA: Diagnosis not present

## 2015-09-12 DIAGNOSIS — N186 End stage renal disease: Secondary | ICD-10-CM | POA: Diagnosis not present

## 2015-09-15 DIAGNOSIS — D509 Iron deficiency anemia, unspecified: Secondary | ICD-10-CM | POA: Diagnosis not present

## 2015-09-15 DIAGNOSIS — N186 End stage renal disease: Secondary | ICD-10-CM | POA: Diagnosis not present

## 2015-09-15 DIAGNOSIS — D631 Anemia in chronic kidney disease: Secondary | ICD-10-CM | POA: Diagnosis not present

## 2015-09-15 DIAGNOSIS — N2581 Secondary hyperparathyroidism of renal origin: Secondary | ICD-10-CM | POA: Diagnosis not present

## 2015-09-17 DIAGNOSIS — N186 End stage renal disease: Secondary | ICD-10-CM | POA: Diagnosis not present

## 2015-09-17 DIAGNOSIS — D631 Anemia in chronic kidney disease: Secondary | ICD-10-CM | POA: Diagnosis not present

## 2015-09-17 DIAGNOSIS — D509 Iron deficiency anemia, unspecified: Secondary | ICD-10-CM | POA: Diagnosis not present

## 2015-09-17 DIAGNOSIS — N033 Chronic nephritic syndrome with diffuse mesangial proliferative glomerulonephritis: Secondary | ICD-10-CM | POA: Diagnosis not present

## 2015-09-17 DIAGNOSIS — N2581 Secondary hyperparathyroidism of renal origin: Secondary | ICD-10-CM | POA: Diagnosis not present

## 2015-09-17 DIAGNOSIS — Z992 Dependence on renal dialysis: Secondary | ICD-10-CM | POA: Diagnosis not present

## 2015-09-19 DIAGNOSIS — N186 End stage renal disease: Secondary | ICD-10-CM | POA: Diagnosis not present

## 2015-09-19 DIAGNOSIS — N2581 Secondary hyperparathyroidism of renal origin: Secondary | ICD-10-CM | POA: Diagnosis not present

## 2015-09-19 DIAGNOSIS — D631 Anemia in chronic kidney disease: Secondary | ICD-10-CM | POA: Diagnosis not present

## 2015-09-22 DIAGNOSIS — N2581 Secondary hyperparathyroidism of renal origin: Secondary | ICD-10-CM | POA: Diagnosis not present

## 2015-09-22 DIAGNOSIS — D631 Anemia in chronic kidney disease: Secondary | ICD-10-CM | POA: Diagnosis not present

## 2015-09-22 DIAGNOSIS — N186 End stage renal disease: Secondary | ICD-10-CM | POA: Diagnosis not present

## 2015-09-24 DIAGNOSIS — N186 End stage renal disease: Secondary | ICD-10-CM | POA: Diagnosis not present

## 2015-09-24 DIAGNOSIS — N2581 Secondary hyperparathyroidism of renal origin: Secondary | ICD-10-CM | POA: Diagnosis not present

## 2015-09-24 DIAGNOSIS — D631 Anemia in chronic kidney disease: Secondary | ICD-10-CM | POA: Diagnosis not present

## 2015-09-26 DIAGNOSIS — N186 End stage renal disease: Secondary | ICD-10-CM | POA: Diagnosis not present

## 2015-09-26 DIAGNOSIS — D631 Anemia in chronic kidney disease: Secondary | ICD-10-CM | POA: Diagnosis not present

## 2015-09-26 DIAGNOSIS — N2581 Secondary hyperparathyroidism of renal origin: Secondary | ICD-10-CM | POA: Diagnosis not present

## 2015-09-29 DIAGNOSIS — D631 Anemia in chronic kidney disease: Secondary | ICD-10-CM | POA: Diagnosis not present

## 2015-09-29 DIAGNOSIS — N186 End stage renal disease: Secondary | ICD-10-CM | POA: Diagnosis not present

## 2015-09-29 DIAGNOSIS — N2581 Secondary hyperparathyroidism of renal origin: Secondary | ICD-10-CM | POA: Diagnosis not present

## 2015-10-01 DIAGNOSIS — N2581 Secondary hyperparathyroidism of renal origin: Secondary | ICD-10-CM | POA: Diagnosis not present

## 2015-10-01 DIAGNOSIS — N186 End stage renal disease: Secondary | ICD-10-CM | POA: Diagnosis not present

## 2015-10-01 DIAGNOSIS — D631 Anemia in chronic kidney disease: Secondary | ICD-10-CM | POA: Diagnosis not present

## 2015-10-03 DIAGNOSIS — D631 Anemia in chronic kidney disease: Secondary | ICD-10-CM | POA: Diagnosis not present

## 2015-10-03 DIAGNOSIS — N2581 Secondary hyperparathyroidism of renal origin: Secondary | ICD-10-CM | POA: Diagnosis not present

## 2015-10-03 DIAGNOSIS — N186 End stage renal disease: Secondary | ICD-10-CM | POA: Diagnosis not present

## 2015-10-06 DIAGNOSIS — N2581 Secondary hyperparathyroidism of renal origin: Secondary | ICD-10-CM | POA: Diagnosis not present

## 2015-10-06 DIAGNOSIS — N186 End stage renal disease: Secondary | ICD-10-CM | POA: Diagnosis not present

## 2015-10-06 DIAGNOSIS — D631 Anemia in chronic kidney disease: Secondary | ICD-10-CM | POA: Diagnosis not present

## 2015-10-08 DIAGNOSIS — N186 End stage renal disease: Secondary | ICD-10-CM | POA: Diagnosis not present

## 2015-10-08 DIAGNOSIS — D631 Anemia in chronic kidney disease: Secondary | ICD-10-CM | POA: Diagnosis not present

## 2015-10-08 DIAGNOSIS — N2581 Secondary hyperparathyroidism of renal origin: Secondary | ICD-10-CM | POA: Diagnosis not present

## 2015-10-10 DIAGNOSIS — D631 Anemia in chronic kidney disease: Secondary | ICD-10-CM | POA: Diagnosis not present

## 2015-10-10 DIAGNOSIS — N2581 Secondary hyperparathyroidism of renal origin: Secondary | ICD-10-CM | POA: Diagnosis not present

## 2015-10-10 DIAGNOSIS — N186 End stage renal disease: Secondary | ICD-10-CM | POA: Diagnosis not present

## 2015-10-13 DIAGNOSIS — N2581 Secondary hyperparathyroidism of renal origin: Secondary | ICD-10-CM | POA: Diagnosis not present

## 2015-10-13 DIAGNOSIS — N186 End stage renal disease: Secondary | ICD-10-CM | POA: Diagnosis not present

## 2015-10-13 DIAGNOSIS — D631 Anemia in chronic kidney disease: Secondary | ICD-10-CM | POA: Diagnosis not present

## 2015-10-15 DIAGNOSIS — N186 End stage renal disease: Secondary | ICD-10-CM | POA: Diagnosis not present

## 2015-10-15 DIAGNOSIS — D631 Anemia in chronic kidney disease: Secondary | ICD-10-CM | POA: Diagnosis not present

## 2015-10-15 DIAGNOSIS — N2581 Secondary hyperparathyroidism of renal origin: Secondary | ICD-10-CM | POA: Diagnosis not present

## 2015-10-17 DIAGNOSIS — N2581 Secondary hyperparathyroidism of renal origin: Secondary | ICD-10-CM | POA: Diagnosis not present

## 2015-10-17 DIAGNOSIS — Z992 Dependence on renal dialysis: Secondary | ICD-10-CM | POA: Diagnosis not present

## 2015-10-17 DIAGNOSIS — D631 Anemia in chronic kidney disease: Secondary | ICD-10-CM | POA: Diagnosis not present

## 2015-10-17 DIAGNOSIS — N186 End stage renal disease: Secondary | ICD-10-CM | POA: Diagnosis not present

## 2015-10-17 DIAGNOSIS — N033 Chronic nephritic syndrome with diffuse mesangial proliferative glomerulonephritis: Secondary | ICD-10-CM | POA: Diagnosis not present

## 2015-10-20 DIAGNOSIS — N186 End stage renal disease: Secondary | ICD-10-CM | POA: Diagnosis not present

## 2015-10-20 DIAGNOSIS — N2581 Secondary hyperparathyroidism of renal origin: Secondary | ICD-10-CM | POA: Diagnosis not present

## 2015-10-20 DIAGNOSIS — D631 Anemia in chronic kidney disease: Secondary | ICD-10-CM | POA: Diagnosis not present

## 2015-10-22 DIAGNOSIS — N2581 Secondary hyperparathyroidism of renal origin: Secondary | ICD-10-CM | POA: Diagnosis not present

## 2015-10-22 DIAGNOSIS — D631 Anemia in chronic kidney disease: Secondary | ICD-10-CM | POA: Diagnosis not present

## 2015-10-22 DIAGNOSIS — N186 End stage renal disease: Secondary | ICD-10-CM | POA: Diagnosis not present

## 2015-10-24 DIAGNOSIS — N2581 Secondary hyperparathyroidism of renal origin: Secondary | ICD-10-CM | POA: Diagnosis not present

## 2015-10-24 DIAGNOSIS — D631 Anemia in chronic kidney disease: Secondary | ICD-10-CM | POA: Diagnosis not present

## 2015-10-24 DIAGNOSIS — N186 End stage renal disease: Secondary | ICD-10-CM | POA: Diagnosis not present

## 2015-10-27 DIAGNOSIS — D631 Anemia in chronic kidney disease: Secondary | ICD-10-CM | POA: Diagnosis not present

## 2015-10-27 DIAGNOSIS — N186 End stage renal disease: Secondary | ICD-10-CM | POA: Diagnosis not present

## 2015-10-27 DIAGNOSIS — N2581 Secondary hyperparathyroidism of renal origin: Secondary | ICD-10-CM | POA: Diagnosis not present

## 2015-10-29 DIAGNOSIS — D631 Anemia in chronic kidney disease: Secondary | ICD-10-CM | POA: Diagnosis not present

## 2015-10-29 DIAGNOSIS — N2581 Secondary hyperparathyroidism of renal origin: Secondary | ICD-10-CM | POA: Diagnosis not present

## 2015-10-29 DIAGNOSIS — N186 End stage renal disease: Secondary | ICD-10-CM | POA: Diagnosis not present

## 2015-10-31 DIAGNOSIS — N2581 Secondary hyperparathyroidism of renal origin: Secondary | ICD-10-CM | POA: Diagnosis not present

## 2015-10-31 DIAGNOSIS — D631 Anemia in chronic kidney disease: Secondary | ICD-10-CM | POA: Diagnosis not present

## 2015-10-31 DIAGNOSIS — N186 End stage renal disease: Secondary | ICD-10-CM | POA: Diagnosis not present

## 2015-11-03 DIAGNOSIS — D631 Anemia in chronic kidney disease: Secondary | ICD-10-CM | POA: Diagnosis not present

## 2015-11-03 DIAGNOSIS — N186 End stage renal disease: Secondary | ICD-10-CM | POA: Diagnosis not present

## 2015-11-03 DIAGNOSIS — N2581 Secondary hyperparathyroidism of renal origin: Secondary | ICD-10-CM | POA: Diagnosis not present

## 2015-11-05 DIAGNOSIS — N186 End stage renal disease: Secondary | ICD-10-CM | POA: Diagnosis not present

## 2015-11-05 DIAGNOSIS — N2581 Secondary hyperparathyroidism of renal origin: Secondary | ICD-10-CM | POA: Diagnosis not present

## 2015-11-05 DIAGNOSIS — D631 Anemia in chronic kidney disease: Secondary | ICD-10-CM | POA: Diagnosis not present

## 2015-11-07 DIAGNOSIS — N186 End stage renal disease: Secondary | ICD-10-CM | POA: Diagnosis not present

## 2015-11-07 DIAGNOSIS — N2581 Secondary hyperparathyroidism of renal origin: Secondary | ICD-10-CM | POA: Diagnosis not present

## 2015-11-07 DIAGNOSIS — D631 Anemia in chronic kidney disease: Secondary | ICD-10-CM | POA: Diagnosis not present

## 2015-11-10 DIAGNOSIS — D631 Anemia in chronic kidney disease: Secondary | ICD-10-CM | POA: Diagnosis not present

## 2015-11-10 DIAGNOSIS — N186 End stage renal disease: Secondary | ICD-10-CM | POA: Diagnosis not present

## 2015-11-10 DIAGNOSIS — N2581 Secondary hyperparathyroidism of renal origin: Secondary | ICD-10-CM | POA: Diagnosis not present

## 2015-11-12 DIAGNOSIS — N186 End stage renal disease: Secondary | ICD-10-CM | POA: Diagnosis not present

## 2015-11-12 DIAGNOSIS — D631 Anemia in chronic kidney disease: Secondary | ICD-10-CM | POA: Diagnosis not present

## 2015-11-12 DIAGNOSIS — N2581 Secondary hyperparathyroidism of renal origin: Secondary | ICD-10-CM | POA: Diagnosis not present

## 2015-11-14 DIAGNOSIS — N186 End stage renal disease: Secondary | ICD-10-CM | POA: Diagnosis not present

## 2015-11-14 DIAGNOSIS — D631 Anemia in chronic kidney disease: Secondary | ICD-10-CM | POA: Diagnosis not present

## 2015-11-14 DIAGNOSIS — N2581 Secondary hyperparathyroidism of renal origin: Secondary | ICD-10-CM | POA: Diagnosis not present

## 2015-11-17 DIAGNOSIS — N033 Chronic nephritic syndrome with diffuse mesangial proliferative glomerulonephritis: Secondary | ICD-10-CM | POA: Diagnosis not present

## 2015-11-17 DIAGNOSIS — N186 End stage renal disease: Secondary | ICD-10-CM | POA: Diagnosis not present

## 2015-11-17 DIAGNOSIS — D631 Anemia in chronic kidney disease: Secondary | ICD-10-CM | POA: Diagnosis not present

## 2015-11-17 DIAGNOSIS — Z992 Dependence on renal dialysis: Secondary | ICD-10-CM | POA: Diagnosis not present

## 2015-11-17 DIAGNOSIS — N2581 Secondary hyperparathyroidism of renal origin: Secondary | ICD-10-CM | POA: Diagnosis not present

## 2015-11-19 DIAGNOSIS — N186 End stage renal disease: Secondary | ICD-10-CM | POA: Diagnosis not present

## 2015-11-19 DIAGNOSIS — N2581 Secondary hyperparathyroidism of renal origin: Secondary | ICD-10-CM | POA: Diagnosis not present

## 2015-11-24 DIAGNOSIS — N186 End stage renal disease: Secondary | ICD-10-CM | POA: Diagnosis not present

## 2015-11-24 DIAGNOSIS — N2581 Secondary hyperparathyroidism of renal origin: Secondary | ICD-10-CM | POA: Diagnosis not present

## 2015-11-26 DIAGNOSIS — N186 End stage renal disease: Secondary | ICD-10-CM | POA: Diagnosis not present

## 2015-11-26 DIAGNOSIS — N2581 Secondary hyperparathyroidism of renal origin: Secondary | ICD-10-CM | POA: Diagnosis not present

## 2015-11-28 DIAGNOSIS — N2581 Secondary hyperparathyroidism of renal origin: Secondary | ICD-10-CM | POA: Diagnosis not present

## 2015-11-28 DIAGNOSIS — N186 End stage renal disease: Secondary | ICD-10-CM | POA: Diagnosis not present

## 2015-12-01 DIAGNOSIS — N186 End stage renal disease: Secondary | ICD-10-CM | POA: Diagnosis not present

## 2015-12-01 DIAGNOSIS — N2581 Secondary hyperparathyroidism of renal origin: Secondary | ICD-10-CM | POA: Diagnosis not present

## 2015-12-03 DIAGNOSIS — N2581 Secondary hyperparathyroidism of renal origin: Secondary | ICD-10-CM | POA: Diagnosis not present

## 2015-12-03 DIAGNOSIS — N186 End stage renal disease: Secondary | ICD-10-CM | POA: Diagnosis not present

## 2015-12-05 DIAGNOSIS — N2581 Secondary hyperparathyroidism of renal origin: Secondary | ICD-10-CM | POA: Diagnosis not present

## 2015-12-05 DIAGNOSIS — N186 End stage renal disease: Secondary | ICD-10-CM | POA: Diagnosis not present

## 2015-12-07 DIAGNOSIS — N2581 Secondary hyperparathyroidism of renal origin: Secondary | ICD-10-CM | POA: Diagnosis not present

## 2015-12-07 DIAGNOSIS — N186 End stage renal disease: Secondary | ICD-10-CM | POA: Diagnosis not present

## 2015-12-09 DIAGNOSIS — N2581 Secondary hyperparathyroidism of renal origin: Secondary | ICD-10-CM | POA: Diagnosis not present

## 2015-12-09 DIAGNOSIS — N186 End stage renal disease: Secondary | ICD-10-CM | POA: Diagnosis not present

## 2015-12-12 DIAGNOSIS — N2581 Secondary hyperparathyroidism of renal origin: Secondary | ICD-10-CM | POA: Diagnosis not present

## 2015-12-12 DIAGNOSIS — N186 End stage renal disease: Secondary | ICD-10-CM | POA: Diagnosis not present

## 2015-12-15 DIAGNOSIS — N186 End stage renal disease: Secondary | ICD-10-CM | POA: Diagnosis not present

## 2015-12-15 DIAGNOSIS — N2581 Secondary hyperparathyroidism of renal origin: Secondary | ICD-10-CM | POA: Diagnosis not present

## 2015-12-16 DIAGNOSIS — T82858A Stenosis of vascular prosthetic devices, implants and grafts, initial encounter: Secondary | ICD-10-CM | POA: Diagnosis not present

## 2015-12-16 DIAGNOSIS — I871 Compression of vein: Secondary | ICD-10-CM | POA: Diagnosis not present

## 2015-12-16 DIAGNOSIS — N186 End stage renal disease: Secondary | ICD-10-CM | POA: Diagnosis not present

## 2015-12-16 DIAGNOSIS — Z992 Dependence on renal dialysis: Secondary | ICD-10-CM | POA: Diagnosis not present

## 2015-12-17 DIAGNOSIS — Z992 Dependence on renal dialysis: Secondary | ICD-10-CM | POA: Diagnosis not present

## 2015-12-17 DIAGNOSIS — N033 Chronic nephritic syndrome with diffuse mesangial proliferative glomerulonephritis: Secondary | ICD-10-CM | POA: Diagnosis not present

## 2015-12-17 DIAGNOSIS — N2581 Secondary hyperparathyroidism of renal origin: Secondary | ICD-10-CM | POA: Diagnosis not present

## 2015-12-17 DIAGNOSIS — N186 End stage renal disease: Secondary | ICD-10-CM | POA: Diagnosis not present

## 2015-12-19 DIAGNOSIS — D631 Anemia in chronic kidney disease: Secondary | ICD-10-CM | POA: Diagnosis not present

## 2015-12-19 DIAGNOSIS — N186 End stage renal disease: Secondary | ICD-10-CM | POA: Diagnosis not present

## 2015-12-19 DIAGNOSIS — N2581 Secondary hyperparathyroidism of renal origin: Secondary | ICD-10-CM | POA: Diagnosis not present

## 2015-12-22 DIAGNOSIS — N186 End stage renal disease: Secondary | ICD-10-CM | POA: Diagnosis not present

## 2015-12-22 DIAGNOSIS — D631 Anemia in chronic kidney disease: Secondary | ICD-10-CM | POA: Diagnosis not present

## 2015-12-22 DIAGNOSIS — N2581 Secondary hyperparathyroidism of renal origin: Secondary | ICD-10-CM | POA: Diagnosis not present

## 2015-12-24 DIAGNOSIS — D631 Anemia in chronic kidney disease: Secondary | ICD-10-CM | POA: Diagnosis not present

## 2015-12-24 DIAGNOSIS — N186 End stage renal disease: Secondary | ICD-10-CM | POA: Diagnosis not present

## 2015-12-24 DIAGNOSIS — N2581 Secondary hyperparathyroidism of renal origin: Secondary | ICD-10-CM | POA: Diagnosis not present

## 2015-12-26 DIAGNOSIS — N186 End stage renal disease: Secondary | ICD-10-CM | POA: Diagnosis not present

## 2015-12-26 DIAGNOSIS — D631 Anemia in chronic kidney disease: Secondary | ICD-10-CM | POA: Diagnosis not present

## 2015-12-26 DIAGNOSIS — N2581 Secondary hyperparathyroidism of renal origin: Secondary | ICD-10-CM | POA: Diagnosis not present

## 2015-12-29 DIAGNOSIS — N2581 Secondary hyperparathyroidism of renal origin: Secondary | ICD-10-CM | POA: Diagnosis not present

## 2015-12-29 DIAGNOSIS — D631 Anemia in chronic kidney disease: Secondary | ICD-10-CM | POA: Diagnosis not present

## 2015-12-29 DIAGNOSIS — N186 End stage renal disease: Secondary | ICD-10-CM | POA: Diagnosis not present

## 2015-12-31 DIAGNOSIS — N186 End stage renal disease: Secondary | ICD-10-CM | POA: Diagnosis not present

## 2015-12-31 DIAGNOSIS — D631 Anemia in chronic kidney disease: Secondary | ICD-10-CM | POA: Diagnosis not present

## 2015-12-31 DIAGNOSIS — N2581 Secondary hyperparathyroidism of renal origin: Secondary | ICD-10-CM | POA: Diagnosis not present

## 2016-01-02 DIAGNOSIS — N2581 Secondary hyperparathyroidism of renal origin: Secondary | ICD-10-CM | POA: Diagnosis not present

## 2016-01-02 DIAGNOSIS — D631 Anemia in chronic kidney disease: Secondary | ICD-10-CM | POA: Diagnosis not present

## 2016-01-02 DIAGNOSIS — N186 End stage renal disease: Secondary | ICD-10-CM | POA: Diagnosis not present

## 2016-01-05 DIAGNOSIS — N186 End stage renal disease: Secondary | ICD-10-CM | POA: Diagnosis not present

## 2016-01-05 DIAGNOSIS — D631 Anemia in chronic kidney disease: Secondary | ICD-10-CM | POA: Diagnosis not present

## 2016-01-05 DIAGNOSIS — N2581 Secondary hyperparathyroidism of renal origin: Secondary | ICD-10-CM | POA: Diagnosis not present

## 2016-01-07 DIAGNOSIS — D631 Anemia in chronic kidney disease: Secondary | ICD-10-CM | POA: Diagnosis not present

## 2016-01-07 DIAGNOSIS — N2581 Secondary hyperparathyroidism of renal origin: Secondary | ICD-10-CM | POA: Diagnosis not present

## 2016-01-07 DIAGNOSIS — N186 End stage renal disease: Secondary | ICD-10-CM | POA: Diagnosis not present

## 2016-01-09 DIAGNOSIS — D631 Anemia in chronic kidney disease: Secondary | ICD-10-CM | POA: Diagnosis not present

## 2016-01-09 DIAGNOSIS — N186 End stage renal disease: Secondary | ICD-10-CM | POA: Diagnosis not present

## 2016-01-09 DIAGNOSIS — N2581 Secondary hyperparathyroidism of renal origin: Secondary | ICD-10-CM | POA: Diagnosis not present

## 2016-01-12 DIAGNOSIS — N186 End stage renal disease: Secondary | ICD-10-CM | POA: Diagnosis not present

## 2016-01-12 DIAGNOSIS — D631 Anemia in chronic kidney disease: Secondary | ICD-10-CM | POA: Diagnosis not present

## 2016-01-12 DIAGNOSIS — N2581 Secondary hyperparathyroidism of renal origin: Secondary | ICD-10-CM | POA: Diagnosis not present

## 2016-01-14 DIAGNOSIS — N2581 Secondary hyperparathyroidism of renal origin: Secondary | ICD-10-CM | POA: Diagnosis not present

## 2016-01-14 DIAGNOSIS — D631 Anemia in chronic kidney disease: Secondary | ICD-10-CM | POA: Diagnosis not present

## 2016-01-14 DIAGNOSIS — N186 End stage renal disease: Secondary | ICD-10-CM | POA: Diagnosis not present

## 2016-01-16 DIAGNOSIS — N2581 Secondary hyperparathyroidism of renal origin: Secondary | ICD-10-CM | POA: Diagnosis not present

## 2016-01-16 DIAGNOSIS — D631 Anemia in chronic kidney disease: Secondary | ICD-10-CM | POA: Diagnosis not present

## 2016-01-16 DIAGNOSIS — N186 End stage renal disease: Secondary | ICD-10-CM | POA: Diagnosis not present

## 2016-01-17 DIAGNOSIS — Z992 Dependence on renal dialysis: Secondary | ICD-10-CM | POA: Diagnosis not present

## 2016-01-17 DIAGNOSIS — N186 End stage renal disease: Secondary | ICD-10-CM | POA: Diagnosis not present

## 2016-01-17 DIAGNOSIS — N033 Chronic nephritic syndrome with diffuse mesangial proliferative glomerulonephritis: Secondary | ICD-10-CM | POA: Diagnosis not present

## 2016-01-19 DIAGNOSIS — N186 End stage renal disease: Secondary | ICD-10-CM | POA: Diagnosis not present

## 2016-01-19 DIAGNOSIS — N2581 Secondary hyperparathyroidism of renal origin: Secondary | ICD-10-CM | POA: Diagnosis not present

## 2016-01-21 DIAGNOSIS — N2581 Secondary hyperparathyroidism of renal origin: Secondary | ICD-10-CM | POA: Diagnosis not present

## 2016-01-21 DIAGNOSIS — N186 End stage renal disease: Secondary | ICD-10-CM | POA: Diagnosis not present

## 2016-01-23 DIAGNOSIS — N2581 Secondary hyperparathyroidism of renal origin: Secondary | ICD-10-CM | POA: Diagnosis not present

## 2016-01-23 DIAGNOSIS — N186 End stage renal disease: Secondary | ICD-10-CM | POA: Diagnosis not present

## 2016-01-26 DIAGNOSIS — N186 End stage renal disease: Secondary | ICD-10-CM | POA: Diagnosis not present

## 2016-01-26 DIAGNOSIS — N2581 Secondary hyperparathyroidism of renal origin: Secondary | ICD-10-CM | POA: Diagnosis not present

## 2016-01-28 DIAGNOSIS — N2581 Secondary hyperparathyroidism of renal origin: Secondary | ICD-10-CM | POA: Diagnosis not present

## 2016-01-28 DIAGNOSIS — N186 End stage renal disease: Secondary | ICD-10-CM | POA: Diagnosis not present

## 2016-01-30 DIAGNOSIS — N2581 Secondary hyperparathyroidism of renal origin: Secondary | ICD-10-CM | POA: Diagnosis not present

## 2016-01-30 DIAGNOSIS — N186 End stage renal disease: Secondary | ICD-10-CM | POA: Diagnosis not present

## 2016-02-02 DIAGNOSIS — N2581 Secondary hyperparathyroidism of renal origin: Secondary | ICD-10-CM | POA: Diagnosis not present

## 2016-02-02 DIAGNOSIS — N186 End stage renal disease: Secondary | ICD-10-CM | POA: Diagnosis not present

## 2016-02-04 DIAGNOSIS — N2581 Secondary hyperparathyroidism of renal origin: Secondary | ICD-10-CM | POA: Diagnosis not present

## 2016-02-04 DIAGNOSIS — N186 End stage renal disease: Secondary | ICD-10-CM | POA: Diagnosis not present

## 2016-02-06 DIAGNOSIS — N186 End stage renal disease: Secondary | ICD-10-CM | POA: Diagnosis not present

## 2016-02-06 DIAGNOSIS — N2581 Secondary hyperparathyroidism of renal origin: Secondary | ICD-10-CM | POA: Diagnosis not present

## 2016-02-09 DIAGNOSIS — N2581 Secondary hyperparathyroidism of renal origin: Secondary | ICD-10-CM | POA: Diagnosis not present

## 2016-02-09 DIAGNOSIS — N186 End stage renal disease: Secondary | ICD-10-CM | POA: Diagnosis not present

## 2016-02-11 DIAGNOSIS — N186 End stage renal disease: Secondary | ICD-10-CM | POA: Diagnosis not present

## 2016-02-11 DIAGNOSIS — N2581 Secondary hyperparathyroidism of renal origin: Secondary | ICD-10-CM | POA: Diagnosis not present

## 2016-02-13 DIAGNOSIS — N186 End stage renal disease: Secondary | ICD-10-CM | POA: Diagnosis not present

## 2016-02-13 DIAGNOSIS — N2581 Secondary hyperparathyroidism of renal origin: Secondary | ICD-10-CM | POA: Diagnosis not present

## 2016-02-15 DIAGNOSIS — N186 End stage renal disease: Secondary | ICD-10-CM | POA: Diagnosis not present

## 2016-02-15 DIAGNOSIS — Z01810 Encounter for preprocedural cardiovascular examination: Secondary | ICD-10-CM | POA: Diagnosis not present

## 2016-02-15 DIAGNOSIS — Z01818 Encounter for other preprocedural examination: Secondary | ICD-10-CM | POA: Diagnosis not present

## 2016-02-16 DIAGNOSIS — N186 End stage renal disease: Secondary | ICD-10-CM | POA: Diagnosis not present

## 2016-02-16 DIAGNOSIS — N2581 Secondary hyperparathyroidism of renal origin: Secondary | ICD-10-CM | POA: Diagnosis not present

## 2016-02-17 DIAGNOSIS — Z992 Dependence on renal dialysis: Secondary | ICD-10-CM | POA: Diagnosis not present

## 2016-02-17 DIAGNOSIS — N186 End stage renal disease: Secondary | ICD-10-CM | POA: Diagnosis not present

## 2016-02-17 DIAGNOSIS — N033 Chronic nephritic syndrome with diffuse mesangial proliferative glomerulonephritis: Secondary | ICD-10-CM | POA: Diagnosis not present

## 2016-02-18 DIAGNOSIS — N2581 Secondary hyperparathyroidism of renal origin: Secondary | ICD-10-CM | POA: Diagnosis not present

## 2016-02-18 DIAGNOSIS — N186 End stage renal disease: Secondary | ICD-10-CM | POA: Diagnosis not present

## 2016-02-20 DIAGNOSIS — N2581 Secondary hyperparathyroidism of renal origin: Secondary | ICD-10-CM | POA: Diagnosis not present

## 2016-02-20 DIAGNOSIS — N186 End stage renal disease: Secondary | ICD-10-CM | POA: Diagnosis not present

## 2016-02-23 DIAGNOSIS — N186 End stage renal disease: Secondary | ICD-10-CM | POA: Diagnosis not present

## 2016-02-23 DIAGNOSIS — N2581 Secondary hyperparathyroidism of renal origin: Secondary | ICD-10-CM | POA: Diagnosis not present

## 2016-02-25 DIAGNOSIS — N2581 Secondary hyperparathyroidism of renal origin: Secondary | ICD-10-CM | POA: Diagnosis not present

## 2016-02-25 DIAGNOSIS — N186 End stage renal disease: Secondary | ICD-10-CM | POA: Diagnosis not present

## 2016-02-27 DIAGNOSIS — N2581 Secondary hyperparathyroidism of renal origin: Secondary | ICD-10-CM | POA: Diagnosis not present

## 2016-02-27 DIAGNOSIS — N186 End stage renal disease: Secondary | ICD-10-CM | POA: Diagnosis not present

## 2016-03-01 DIAGNOSIS — N2581 Secondary hyperparathyroidism of renal origin: Secondary | ICD-10-CM | POA: Diagnosis not present

## 2016-03-01 DIAGNOSIS — N186 End stage renal disease: Secondary | ICD-10-CM | POA: Diagnosis not present

## 2016-03-02 DIAGNOSIS — T82858A Stenosis of vascular prosthetic devices, implants and grafts, initial encounter: Secondary | ICD-10-CM | POA: Diagnosis not present

## 2016-03-02 DIAGNOSIS — Z992 Dependence on renal dialysis: Secondary | ICD-10-CM | POA: Diagnosis not present

## 2016-03-02 DIAGNOSIS — I871 Compression of vein: Secondary | ICD-10-CM | POA: Diagnosis not present

## 2016-03-02 DIAGNOSIS — N186 End stage renal disease: Secondary | ICD-10-CM | POA: Diagnosis not present

## 2016-03-03 DIAGNOSIS — N2581 Secondary hyperparathyroidism of renal origin: Secondary | ICD-10-CM | POA: Diagnosis not present

## 2016-03-03 DIAGNOSIS — N186 End stage renal disease: Secondary | ICD-10-CM | POA: Diagnosis not present

## 2016-03-05 DIAGNOSIS — N186 End stage renal disease: Secondary | ICD-10-CM | POA: Diagnosis not present

## 2016-03-05 DIAGNOSIS — N2581 Secondary hyperparathyroidism of renal origin: Secondary | ICD-10-CM | POA: Diagnosis not present

## 2016-03-08 DIAGNOSIS — N186 End stage renal disease: Secondary | ICD-10-CM | POA: Diagnosis not present

## 2016-03-08 DIAGNOSIS — N2581 Secondary hyperparathyroidism of renal origin: Secondary | ICD-10-CM | POA: Diagnosis not present

## 2016-03-10 DIAGNOSIS — N2581 Secondary hyperparathyroidism of renal origin: Secondary | ICD-10-CM | POA: Diagnosis not present

## 2016-03-10 DIAGNOSIS — N186 End stage renal disease: Secondary | ICD-10-CM | POA: Diagnosis not present

## 2016-03-12 DIAGNOSIS — N2581 Secondary hyperparathyroidism of renal origin: Secondary | ICD-10-CM | POA: Diagnosis not present

## 2016-03-12 DIAGNOSIS — N186 End stage renal disease: Secondary | ICD-10-CM | POA: Diagnosis not present

## 2016-03-15 DIAGNOSIS — N186 End stage renal disease: Secondary | ICD-10-CM | POA: Diagnosis not present

## 2016-03-15 DIAGNOSIS — N2581 Secondary hyperparathyroidism of renal origin: Secondary | ICD-10-CM | POA: Diagnosis not present

## 2016-03-16 DIAGNOSIS — Z992 Dependence on renal dialysis: Secondary | ICD-10-CM | POA: Diagnosis not present

## 2016-03-16 DIAGNOSIS — N033 Chronic nephritic syndrome with diffuse mesangial proliferative glomerulonephritis: Secondary | ICD-10-CM | POA: Diagnosis not present

## 2016-03-16 DIAGNOSIS — N186 End stage renal disease: Secondary | ICD-10-CM | POA: Diagnosis not present

## 2016-03-17 DIAGNOSIS — D631 Anemia in chronic kidney disease: Secondary | ICD-10-CM | POA: Diagnosis not present

## 2016-03-17 DIAGNOSIS — N186 End stage renal disease: Secondary | ICD-10-CM | POA: Diagnosis not present

## 2016-03-17 DIAGNOSIS — N2581 Secondary hyperparathyroidism of renal origin: Secondary | ICD-10-CM | POA: Diagnosis not present

## 2016-03-19 DIAGNOSIS — N2581 Secondary hyperparathyroidism of renal origin: Secondary | ICD-10-CM | POA: Diagnosis not present

## 2016-03-19 DIAGNOSIS — N186 End stage renal disease: Secondary | ICD-10-CM | POA: Diagnosis not present

## 2016-03-19 DIAGNOSIS — D631 Anemia in chronic kidney disease: Secondary | ICD-10-CM | POA: Diagnosis not present

## 2016-03-22 DIAGNOSIS — D631 Anemia in chronic kidney disease: Secondary | ICD-10-CM | POA: Diagnosis not present

## 2016-03-22 DIAGNOSIS — N2581 Secondary hyperparathyroidism of renal origin: Secondary | ICD-10-CM | POA: Diagnosis not present

## 2016-03-22 DIAGNOSIS — N186 End stage renal disease: Secondary | ICD-10-CM | POA: Diagnosis not present

## 2016-03-24 DIAGNOSIS — N186 End stage renal disease: Secondary | ICD-10-CM | POA: Diagnosis not present

## 2016-03-24 DIAGNOSIS — N2581 Secondary hyperparathyroidism of renal origin: Secondary | ICD-10-CM | POA: Diagnosis not present

## 2016-03-24 DIAGNOSIS — D631 Anemia in chronic kidney disease: Secondary | ICD-10-CM | POA: Diagnosis not present

## 2016-03-26 DIAGNOSIS — N186 End stage renal disease: Secondary | ICD-10-CM | POA: Diagnosis not present

## 2016-03-26 DIAGNOSIS — N2581 Secondary hyperparathyroidism of renal origin: Secondary | ICD-10-CM | POA: Diagnosis not present

## 2016-03-26 DIAGNOSIS — D631 Anemia in chronic kidney disease: Secondary | ICD-10-CM | POA: Diagnosis not present

## 2016-03-29 DIAGNOSIS — D631 Anemia in chronic kidney disease: Secondary | ICD-10-CM | POA: Diagnosis not present

## 2016-03-29 DIAGNOSIS — N2581 Secondary hyperparathyroidism of renal origin: Secondary | ICD-10-CM | POA: Diagnosis not present

## 2016-03-29 DIAGNOSIS — N186 End stage renal disease: Secondary | ICD-10-CM | POA: Diagnosis not present

## 2016-03-31 DIAGNOSIS — D631 Anemia in chronic kidney disease: Secondary | ICD-10-CM | POA: Diagnosis not present

## 2016-03-31 DIAGNOSIS — N186 End stage renal disease: Secondary | ICD-10-CM | POA: Diagnosis not present

## 2016-03-31 DIAGNOSIS — N2581 Secondary hyperparathyroidism of renal origin: Secondary | ICD-10-CM | POA: Diagnosis not present

## 2016-04-02 DIAGNOSIS — D631 Anemia in chronic kidney disease: Secondary | ICD-10-CM | POA: Diagnosis not present

## 2016-04-02 DIAGNOSIS — N2581 Secondary hyperparathyroidism of renal origin: Secondary | ICD-10-CM | POA: Diagnosis not present

## 2016-04-02 DIAGNOSIS — N186 End stage renal disease: Secondary | ICD-10-CM | POA: Diagnosis not present

## 2016-04-05 DIAGNOSIS — N186 End stage renal disease: Secondary | ICD-10-CM | POA: Diagnosis not present

## 2016-04-05 DIAGNOSIS — D631 Anemia in chronic kidney disease: Secondary | ICD-10-CM | POA: Diagnosis not present

## 2016-04-05 DIAGNOSIS — N2581 Secondary hyperparathyroidism of renal origin: Secondary | ICD-10-CM | POA: Diagnosis not present

## 2016-04-07 DIAGNOSIS — D631 Anemia in chronic kidney disease: Secondary | ICD-10-CM | POA: Diagnosis not present

## 2016-04-07 DIAGNOSIS — N2581 Secondary hyperparathyroidism of renal origin: Secondary | ICD-10-CM | POA: Diagnosis not present

## 2016-04-07 DIAGNOSIS — N186 End stage renal disease: Secondary | ICD-10-CM | POA: Diagnosis not present

## 2016-04-09 DIAGNOSIS — D631 Anemia in chronic kidney disease: Secondary | ICD-10-CM | POA: Diagnosis not present

## 2016-04-09 DIAGNOSIS — N186 End stage renal disease: Secondary | ICD-10-CM | POA: Diagnosis not present

## 2016-04-09 DIAGNOSIS — N2581 Secondary hyperparathyroidism of renal origin: Secondary | ICD-10-CM | POA: Diagnosis not present

## 2016-04-12 DIAGNOSIS — N2581 Secondary hyperparathyroidism of renal origin: Secondary | ICD-10-CM | POA: Diagnosis not present

## 2016-04-12 DIAGNOSIS — D631 Anemia in chronic kidney disease: Secondary | ICD-10-CM | POA: Diagnosis not present

## 2016-04-12 DIAGNOSIS — N186 End stage renal disease: Secondary | ICD-10-CM | POA: Diagnosis not present

## 2016-04-14 DIAGNOSIS — N186 End stage renal disease: Secondary | ICD-10-CM | POA: Diagnosis not present

## 2016-04-14 DIAGNOSIS — D631 Anemia in chronic kidney disease: Secondary | ICD-10-CM | POA: Diagnosis not present

## 2016-04-14 DIAGNOSIS — N2581 Secondary hyperparathyroidism of renal origin: Secondary | ICD-10-CM | POA: Diagnosis not present

## 2016-04-16 DIAGNOSIS — Z992 Dependence on renal dialysis: Secondary | ICD-10-CM | POA: Diagnosis not present

## 2016-04-16 DIAGNOSIS — D631 Anemia in chronic kidney disease: Secondary | ICD-10-CM | POA: Diagnosis not present

## 2016-04-16 DIAGNOSIS — N033 Chronic nephritic syndrome with diffuse mesangial proliferative glomerulonephritis: Secondary | ICD-10-CM | POA: Diagnosis not present

## 2016-04-16 DIAGNOSIS — N2581 Secondary hyperparathyroidism of renal origin: Secondary | ICD-10-CM | POA: Diagnosis not present

## 2016-04-16 DIAGNOSIS — N186 End stage renal disease: Secondary | ICD-10-CM | POA: Diagnosis not present

## 2016-04-19 DIAGNOSIS — N186 End stage renal disease: Secondary | ICD-10-CM | POA: Diagnosis not present

## 2016-04-19 DIAGNOSIS — N2581 Secondary hyperparathyroidism of renal origin: Secondary | ICD-10-CM | POA: Diagnosis not present

## 2016-04-19 DIAGNOSIS — D631 Anemia in chronic kidney disease: Secondary | ICD-10-CM | POA: Diagnosis not present

## 2016-04-21 DIAGNOSIS — N2581 Secondary hyperparathyroidism of renal origin: Secondary | ICD-10-CM | POA: Diagnosis not present

## 2016-04-21 DIAGNOSIS — D631 Anemia in chronic kidney disease: Secondary | ICD-10-CM | POA: Diagnosis not present

## 2016-04-21 DIAGNOSIS — N186 End stage renal disease: Secondary | ICD-10-CM | POA: Diagnosis not present

## 2016-04-23 DIAGNOSIS — N186 End stage renal disease: Secondary | ICD-10-CM | POA: Diagnosis not present

## 2016-04-23 DIAGNOSIS — D631 Anemia in chronic kidney disease: Secondary | ICD-10-CM | POA: Diagnosis not present

## 2016-04-23 DIAGNOSIS — N2581 Secondary hyperparathyroidism of renal origin: Secondary | ICD-10-CM | POA: Diagnosis not present

## 2016-04-26 DIAGNOSIS — N186 End stage renal disease: Secondary | ICD-10-CM | POA: Diagnosis not present

## 2016-04-26 DIAGNOSIS — D631 Anemia in chronic kidney disease: Secondary | ICD-10-CM | POA: Diagnosis not present

## 2016-04-26 DIAGNOSIS — N2581 Secondary hyperparathyroidism of renal origin: Secondary | ICD-10-CM | POA: Diagnosis not present

## 2016-04-28 DIAGNOSIS — D631 Anemia in chronic kidney disease: Secondary | ICD-10-CM | POA: Diagnosis not present

## 2016-04-28 DIAGNOSIS — N186 End stage renal disease: Secondary | ICD-10-CM | POA: Diagnosis not present

## 2016-04-28 DIAGNOSIS — N2581 Secondary hyperparathyroidism of renal origin: Secondary | ICD-10-CM | POA: Diagnosis not present

## 2016-05-02 DIAGNOSIS — N2581 Secondary hyperparathyroidism of renal origin: Secondary | ICD-10-CM | POA: Diagnosis not present

## 2016-05-02 DIAGNOSIS — N186 End stage renal disease: Secondary | ICD-10-CM | POA: Diagnosis not present

## 2016-05-02 DIAGNOSIS — D631 Anemia in chronic kidney disease: Secondary | ICD-10-CM | POA: Diagnosis not present

## 2016-05-03 IMAGING — XA IR US GUIDE VASC ACCESS RIGHT
1 series · 2 of 2 positions shown · non-contrast
Comparison: none

CLINICAL DATA: Renal failure

[Series 1: run · 2 of 2 slices shown]
[im 1/2]
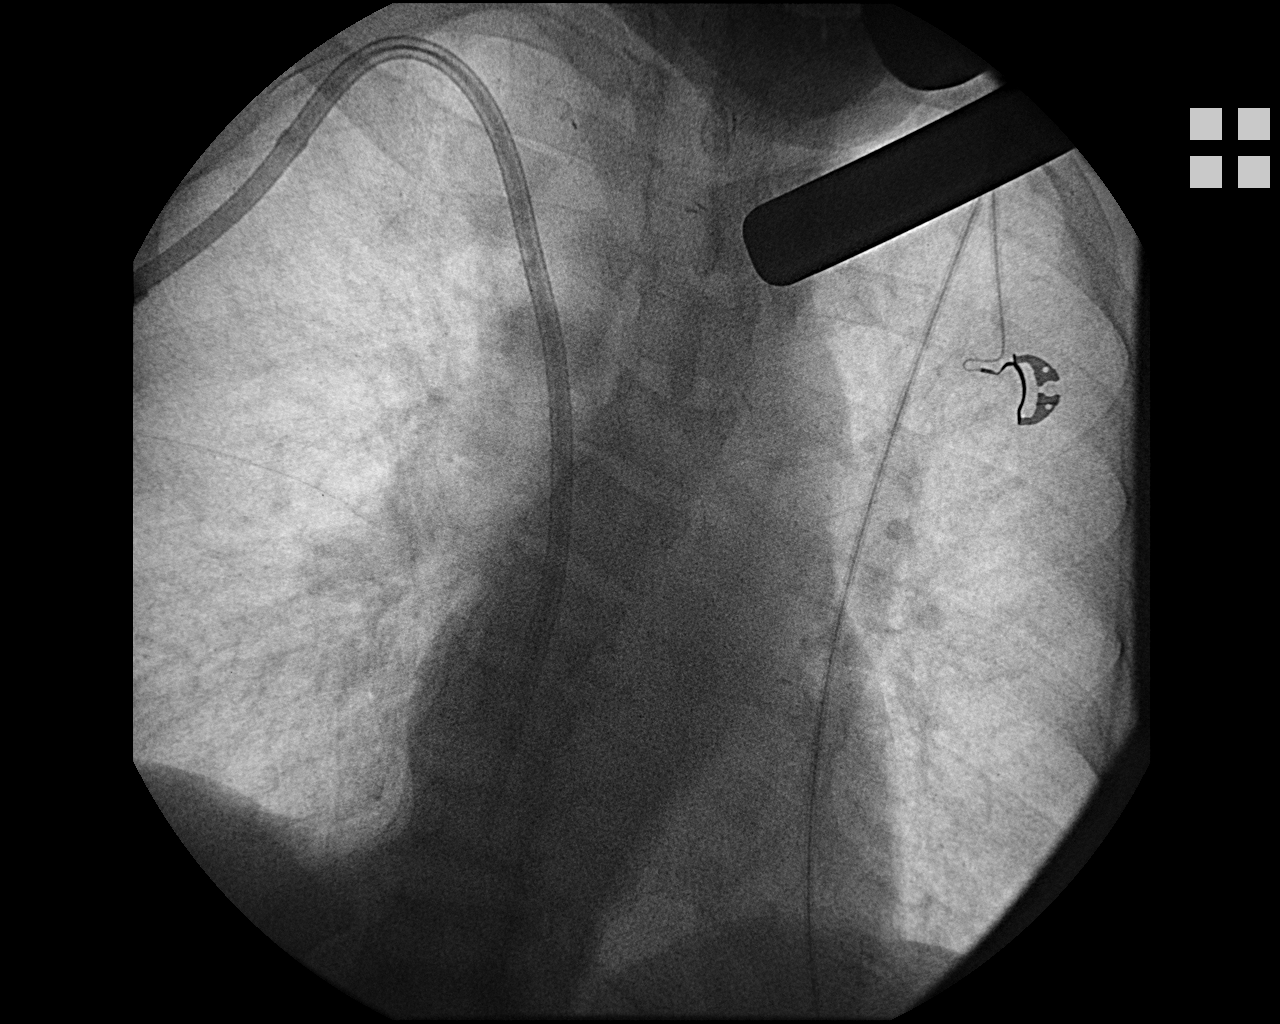
[im 2/2]
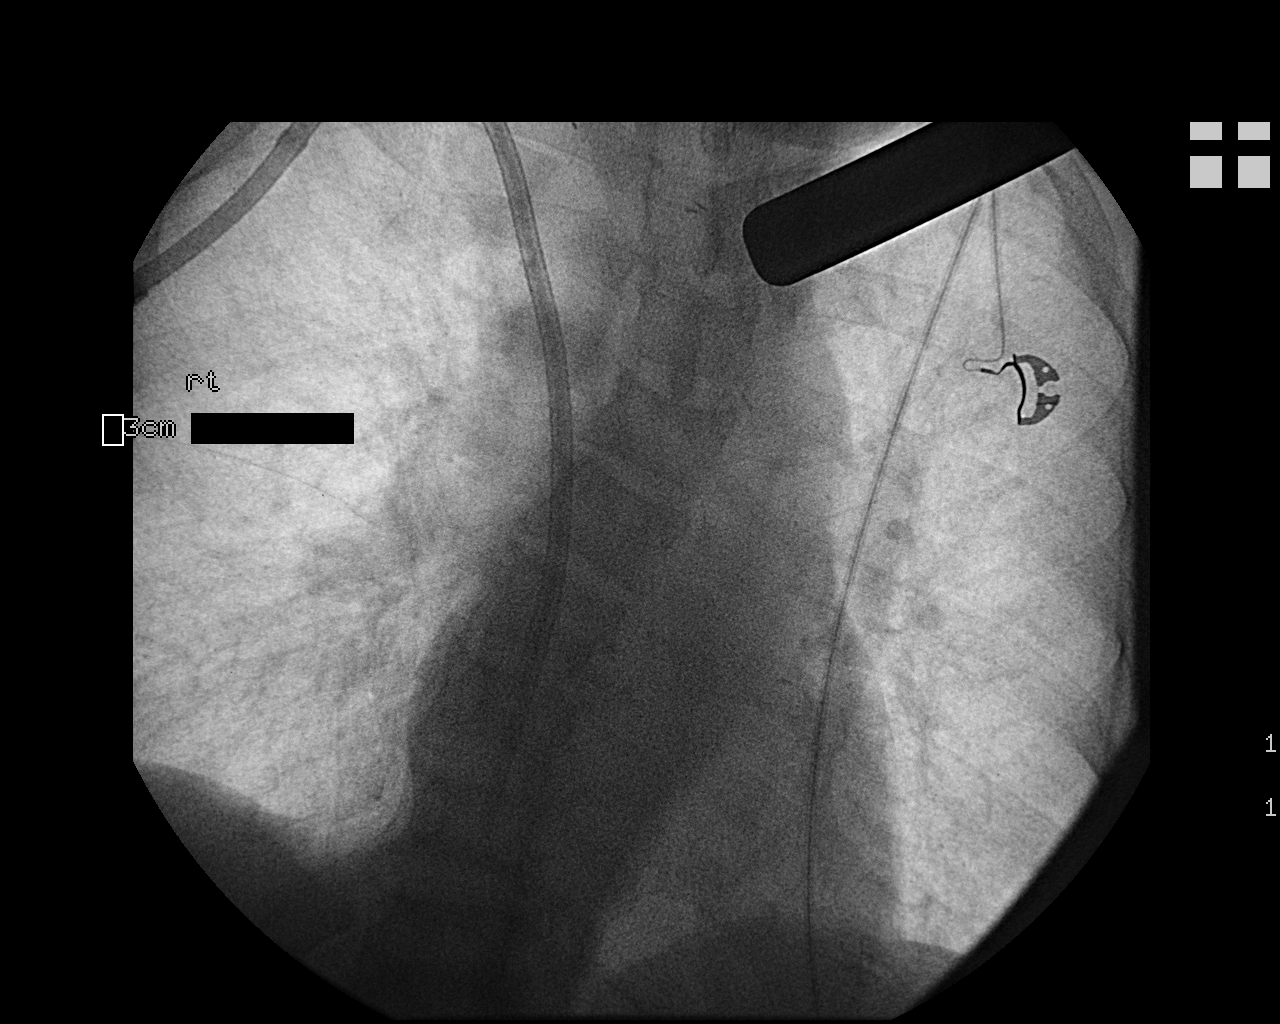

[2 of 2 positions shown; findings below may reference images not displayed]

EXAM:
TUNNELED DIALYSIS CATHETER PLACEMENT, ULTRASOUND GUIDANCE FOR
VASCULAR ACCESS

FLUOROSCOPY TIME:  42 seconds

MEDICATIONS AND MEDICAL HISTORY:
Versed 1 mg, Fentanyl 50 mcg.

Vancomycin was given within two hours of incision. Vancomycin was
given due to an antibiotic allergy.

ANESTHESIA/SEDATION:
Moderate sedation time: 20 minutes

CONTRAST:  None

PROCEDURE:
The procedure, risks, benefits, and alternatives were explained to
the patient. Questions regarding the procedure were encouraged and
answered. The patient understands and consents to the procedure.

The right neck was prepped with Betadine in a sterile fashion, and a
sterile drape was applied covering the operative field. A sterile
gown and sterile gloves were used for the procedure. 1% lidocaine
into the skin and subcutaneous tissue. The right internal jugular
vein was noted to be patent initially with ultrasound. Under
sonographic guidance, a micropuncture needle was inserted into the
right IJ vein (Ultrasound and fluoroscopic image documentation was
performed). It was removed over an 018 wire which was upsized to an
Amplatz. This was advanced into the IVC.

A small incision was made in the right upper chest. The tunneling
device was utilized to advance the 23 centimeter tip to cuff
catheter from the chest incision and out the neck incision. A
peel-away sheath was advanced over the Amplatz wire. The leading
edge of the catheter was then advanced through the peel-away sheath.
The peel-away sheath was removed. It was flushed and instilled with
heparin. The chest incision was closed with a 0 Prolene pursestring
stitch. The neck incision was closed with a 4-0 Vicryl subcuticular
stitch.

COMPLICATIONS:
None
FINDINGS: The image demonstrates placement of a tunneled dialysis catheter
with its tip in the right atrium.
IMPRESSION: Successful right IJ vein tunneled dialysis catheter with its tip in
the right atrium.

## 2016-05-05 DIAGNOSIS — D631 Anemia in chronic kidney disease: Secondary | ICD-10-CM | POA: Diagnosis not present

## 2016-05-05 DIAGNOSIS — N2581 Secondary hyperparathyroidism of renal origin: Secondary | ICD-10-CM | POA: Diagnosis not present

## 2016-05-05 DIAGNOSIS — N186 End stage renal disease: Secondary | ICD-10-CM | POA: Diagnosis not present

## 2016-05-07 DIAGNOSIS — N2581 Secondary hyperparathyroidism of renal origin: Secondary | ICD-10-CM | POA: Diagnosis not present

## 2016-05-07 DIAGNOSIS — D631 Anemia in chronic kidney disease: Secondary | ICD-10-CM | POA: Diagnosis not present

## 2016-05-07 DIAGNOSIS — N186 End stage renal disease: Secondary | ICD-10-CM | POA: Diagnosis not present

## 2016-05-10 DIAGNOSIS — N2581 Secondary hyperparathyroidism of renal origin: Secondary | ICD-10-CM | POA: Diagnosis not present

## 2016-05-10 DIAGNOSIS — D631 Anemia in chronic kidney disease: Secondary | ICD-10-CM | POA: Diagnosis not present

## 2016-05-10 DIAGNOSIS — N186 End stage renal disease: Secondary | ICD-10-CM | POA: Diagnosis not present

## 2016-05-12 DIAGNOSIS — D631 Anemia in chronic kidney disease: Secondary | ICD-10-CM | POA: Diagnosis not present

## 2016-05-12 DIAGNOSIS — N2581 Secondary hyperparathyroidism of renal origin: Secondary | ICD-10-CM | POA: Diagnosis not present

## 2016-05-12 DIAGNOSIS — N186 End stage renal disease: Secondary | ICD-10-CM | POA: Diagnosis not present

## 2016-05-14 DIAGNOSIS — N2581 Secondary hyperparathyroidism of renal origin: Secondary | ICD-10-CM | POA: Diagnosis not present

## 2016-05-14 DIAGNOSIS — N186 End stage renal disease: Secondary | ICD-10-CM | POA: Diagnosis not present

## 2016-05-14 DIAGNOSIS — D631 Anemia in chronic kidney disease: Secondary | ICD-10-CM | POA: Diagnosis not present

## 2016-05-16 DIAGNOSIS — N033 Chronic nephritic syndrome with diffuse mesangial proliferative glomerulonephritis: Secondary | ICD-10-CM | POA: Diagnosis not present

## 2016-05-16 DIAGNOSIS — N186 End stage renal disease: Secondary | ICD-10-CM | POA: Diagnosis not present

## 2016-05-16 DIAGNOSIS — Z992 Dependence on renal dialysis: Secondary | ICD-10-CM | POA: Diagnosis not present

## 2016-05-17 ENCOUNTER — Telehealth: Payer: Self-pay | Admitting: Internal Medicine

## 2016-05-17 DIAGNOSIS — N186 End stage renal disease: Secondary | ICD-10-CM | POA: Diagnosis not present

## 2016-05-17 DIAGNOSIS — N2581 Secondary hyperparathyroidism of renal origin: Secondary | ICD-10-CM | POA: Diagnosis not present

## 2016-05-19 DIAGNOSIS — N2581 Secondary hyperparathyroidism of renal origin: Secondary | ICD-10-CM | POA: Diagnosis not present

## 2016-05-19 DIAGNOSIS — N186 End stage renal disease: Secondary | ICD-10-CM | POA: Diagnosis not present

## 2016-05-20 ENCOUNTER — Ambulatory Visit (INDEPENDENT_AMBULATORY_CARE_PROVIDER_SITE_OTHER): Payer: Medicare Other | Admitting: Internal Medicine

## 2016-05-20 ENCOUNTER — Encounter: Payer: Self-pay | Admitting: Internal Medicine

## 2016-05-20 ENCOUNTER — Ambulatory Visit (INDEPENDENT_AMBULATORY_CARE_PROVIDER_SITE_OTHER)
Admission: RE | Admit: 2016-05-20 | Discharge: 2016-05-20 | Disposition: A | Discharge status: Home / Self Care | Payer: Medicare Other | Source: Ambulatory Visit | Attending: Internal Medicine | Admitting: Internal Medicine

## 2016-05-20 ENCOUNTER — Other Ambulatory Visit: Payer: Self-pay | Admitting: Internal Medicine

## 2016-05-20 VITALS — BP 120/74 | HR 78 | Temp 98.6°F | Resp 12 | Ht 67.0 in | Wt 144.0 lb

## 2016-05-20 DIAGNOSIS — M542 Cervicalgia: Secondary | ICD-10-CM

## 2016-05-20 DIAGNOSIS — R202 Paresthesia of skin: Secondary | ICD-10-CM | POA: Diagnosis not present

## 2016-05-20 DIAGNOSIS — R200 Anesthesia of skin: Secondary | ICD-10-CM | POA: Diagnosis not present

## 2016-05-20 DIAGNOSIS — M50321 Other cervical disc degeneration at C4-C5 level: Secondary | ICD-10-CM | POA: Diagnosis not present

## 2016-05-20 MED ORDER — ALPRAZOLAM 0.25 MG PO TABS: 0.2500 mg | ORAL_TABLET | Freq: Every day | ORAL | 1 refills | Status: DC | PRN

## 2016-05-20 NOTE — Patient Instructions (Signed)
We will check the x-ray of the neck to see if there is any arthritis to find the cause of the hand pain.

## 2016-05-20 NOTE — Assessment & Plan Note (Signed)
Progressive since last year. Checking x-ray cervical today since she is having some neck pain and stiffness. If signs of arthritis will refer to neurosurgery. If no signs will refer to hand surgeon. Advised her to quit smoking cigarettes as this can cause problems with blood flow to her hands.

## 2016-05-20 NOTE — Progress Notes (Signed)
   Subjective:    Patient ID: [name], female    DOB: [birth year], 56 y.o.   MRN: [id number]  HPI The patient is a 56 YO female coming in for numbness and tingling in both her hands. Last year she has having intermittent problems with her left hand only. Now in the last month it is both hands. She is having some weakness with grip. She denies cold hands or color change. 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 She is having some neck pain and stiffness in the last several months as well which is new. There is also pain in her hands which is throbbing and relieved with taking aleve. She denies any wounds or ulcers. Still smoking cigarettes. Numbness is in her fingertips only.   Review of Systems  Constitutional: Positive for activity change. Negative for appetite change, fatigue, fever and unexpected weight change.  Respiratory: Negative.   Cardiovascular: Negative.   Gastrointestinal: Negative.   Musculoskeletal: Positive for myalgias, neck pain and neck stiffness.  Skin: Negative.   Neurological: Positive for numbness. Negative for dizziness, tremors, syncope, facial asymmetry and weakness.      Objective:   Physical Exam  Constitutional: She is oriented to person, place, and time. She appears well-developed and well-nourished.  HENT:  Head: Normocephalic and atraumatic.  Eyes: EOM are normal.  Neck: Normal range of motion.  Cardiovascular: Normal rate and regular rhythm.   Pulmonary/Chest: Effort normal.  Abdominal: Soft.  Musculoskeletal: She exhibits tenderness.  Some tenderness in the cervical region, some stiffness which is active only, passive ROM okay. Pulses in the wrists are present and palpable bilaterally and capillary refill is brisk.   Neurological: She is alert and oriented to person, place, and time.  Skin: Skin is warm and dry.   Vitals:   05/20/16 0959  BP: 120/74  Pulse: 78  Resp: 12  Temp: 98.6 F (37 C)  TempSrc: Oral  SpO2: 99%  Weight:  144 lb (65.3 kg)  Height: 5\' 7"  (1.702 m)      Assessment & Plan:

## 2016-05-20 NOTE — Progress Notes (Signed)
Pre visit review using our clinic review tool, if applicable. No additional management support is needed unless otherwise documented below in the visit note. 

## 2016-05-21 DIAGNOSIS — N2581 Secondary hyperparathyroidism of renal origin: Secondary | ICD-10-CM | POA: Diagnosis not present

## 2016-05-21 DIAGNOSIS — N186 End stage renal disease: Secondary | ICD-10-CM | POA: Diagnosis not present

## 2016-05-23 ENCOUNTER — Other Ambulatory Visit: Payer: Self-pay | Admitting: Internal Medicine

## 2016-05-24 DIAGNOSIS — N2581 Secondary hyperparathyroidism of renal origin: Secondary | ICD-10-CM | POA: Diagnosis not present

## 2016-05-24 DIAGNOSIS — N186 End stage renal disease: Secondary | ICD-10-CM | POA: Diagnosis not present

## 2016-05-25 ENCOUNTER — Encounter: Payer: Medicare Other | Admitting: Internal Medicine

## 2016-05-26 DIAGNOSIS — N186 End stage renal disease: Secondary | ICD-10-CM | POA: Diagnosis not present

## 2016-05-26 DIAGNOSIS — N2581 Secondary hyperparathyroidism of renal origin: Secondary | ICD-10-CM | POA: Diagnosis not present

## 2016-05-28 DIAGNOSIS — N2581 Secondary hyperparathyroidism of renal origin: Secondary | ICD-10-CM | POA: Diagnosis not present

## 2016-05-28 DIAGNOSIS — N186 End stage renal disease: Secondary | ICD-10-CM | POA: Diagnosis not present

## 2016-05-31 DIAGNOSIS — N2581 Secondary hyperparathyroidism of renal origin: Secondary | ICD-10-CM | POA: Diagnosis not present

## 2016-05-31 DIAGNOSIS — N186 End stage renal disease: Secondary | ICD-10-CM | POA: Diagnosis not present

## 2016-06-01 ENCOUNTER — Encounter: Payer: Self-pay | Admitting: Internal Medicine

## 2016-06-01 ENCOUNTER — Ambulatory Visit (INDEPENDENT_AMBULATORY_CARE_PROVIDER_SITE_OTHER): Payer: Medicare Other | Admitting: Internal Medicine

## 2016-06-01 VITALS — BP 130/70 | HR 79 | Temp 98.3°F | Resp 12 | Ht 67.0 in | Wt 143.0 lb

## 2016-06-01 DIAGNOSIS — Z0000 Encounter for general adult medical examination without abnormal findings: Secondary | ICD-10-CM | POA: Insufficient documentation

## 2016-06-01 DIAGNOSIS — Z1231 Encounter for screening mammogram for malignant neoplasm of breast: Secondary | ICD-10-CM

## 2016-06-01 NOTE — Patient Instructions (Signed)
The shingles shot is called shingrix so ask the pharmacist about the cost of this. If you want to get it we can call in the prescription.   Keep up the good work with the health and keep working on stopping smoking.   Call about getting the mammogram from the breast center. [phone]   Health Maintenance, Female Adopting a healthy lifestyle and getting preventive care can go a long way to promote health and wellness. Talk with your health care provider about what schedule of regular examinations is right for you. This is a good chance for you to check in with your provider about disease prevention and staying healthy. In between checkups, there are plenty of things you can do on your own. Experts have done a lot of research about which lifestyle changes and preventive measures are most likely to keep you healthy. Ask your health care provider for more information. Weight and diet Eat a healthy diet  Be sure to include plenty of vegetables, fruits, low-fat dairy products, and lean protein.  Do not eat a lot of foods high in solid fats, added sugars, or salt.  Get regular exercise. This is one of the most important things you can do for your health.  Most adults should exercise for at least 150 minutes each week. The exercise should increase your heart rate and make you sweat (moderate-intensity exercise).  Most adults should also do strengthening exercises at least twice a week. This is in addition to the moderate-intensity exercise. Maintain a healthy weight  Body mass index (BMI) is a measurement that can be used to identify possible weight problems. It estimates body fat based on height and weight. Your health care provider can help determine your BMI and help you achieve or maintain a healthy weight.  For females 44 years of age and older:  A BMI below 18.5 is considered underweight.  A BMI of 18.5 to 24.9 is normal.  A BMI of 25 to 29.9 is considered overweight.  A BMI of  30 and above is considered obese. Watch levels of cholesterol and blood lipids  You should start having your blood tested for lipids and cholesterol at 55 years of age, then have this test every 5 years.  You may need to have your cholesterol levels checked more often if:  Your lipid or cholesterol levels are high.  You are older than 56 years of age.  You are at high risk for heart disease. Cancer screening Lung Cancer  Lung cancer screening is recommended for adults 63-54 years old who are at high risk for lung cancer because of a history of smoking.  A yearly low-dose CT scan of the lungs is recommended for people who:  Currently smoke.  Have quit within the past 15 years.  Have at least a 30-pack-year history of smoking. A pack year is smoking an average of one pack of cigarettes a day for 1 year.  Yearly screening should continue until it has been 15 years since you quit.  Yearly screening should stop if you develop a health problem that would prevent you from having lung cancer treatment. Breast Cancer  Practice breast self-awareness. This means understanding how your breasts normally appear and feel.  It also means doing regular breast self-exams. Let your health care provider know about any changes, no matter how small.  If you are in your 20s or 30s, you should have a clinical breast exam (CBE) by a health care provider every 1-3 years  as part of a regular health exam.  If you are 69 or older, have a CBE every year. Also consider having a breast X-ray (mammogram) every year.  If you have a family history of breast cancer, talk to your health care provider about genetic screening.  If you are at high risk for breast cancer, talk to your health care provider about having an MRI and a mammogram every year.  Breast cancer gene (BRCA) assessment is recommended for women who have family members with BRCA-related cancers. BRCA-related cancers  include:  Breast.  Ovarian.  Tubal.  Peritoneal cancers.  Results of the assessment will determine the need for genetic counseling and BRCA1 and BRCA2 testing. Cervical Cancer  Your health care provider may recommend that you be screened regularly for cancer of the pelvic organs (ovaries, uterus, and vagina). This screening involves a pelvic examination, including checking for microscopic changes to the surface of your cervix (Pap test). You may be encouraged to have this screening done every 3 years, beginning at age 45.  For women ages 44-65, health care providers may recommend pelvic exams and Pap testing every 3 years, or they may recommend the Pap and pelvic exam, combined with testing for human papilloma virus (HPV), every 5 years. Some types of HPV increase your risk of cervical cancer. Testing for HPV may also be done on women of any age with unclear Pap test results.  Other health care providers may not recommend any screening for nonpregnant women who are considered low risk for pelvic cancer and who do not have symptoms. Ask your health care provider if a screening pelvic exam is right for you.  If you have had past treatment for cervical cancer or a condition that could lead to cancer, you need Pap tests and screening for cancer for at least 20 years after your treatment. If Pap tests have been discontinued, your risk factors (such as having a new sexual partner) need to be reassessed to determine if screening should resume. Some women have medical problems that increase the chance of getting cervical cancer. In these cases, your health care provider may recommend more frequent screening and Pap tests. Colorectal Cancer  This type of cancer can be detected and often prevented.  Routine colorectal cancer screening usually begins at 56 years of age and continues through 56 years of age.  Your health care provider may recommend screening at an earlier age if you have risk factors  for colon cancer.  Your health care provider may also recommend using home test kits to check for hidden blood in the stool.  A small camera at the end of a tube can be used to examine your colon directly (sigmoidoscopy or colonoscopy). This is done to check for the earliest forms of colorectal cancer.  Routine screening usually begins at age 24.  Direct examination of the colon should be repeated every 5-10 years through 56 years of age. However, you may need to be screened more often if early forms of precancerous polyps or small growths are found. Skin Cancer  Check your skin from head to toe regularly.  Tell your health care provider about any new moles or changes in moles, especially if there is a change in a mole's shape or color.  Also tell your health care provider if you have a mole that is larger than the size of a pencil eraser.  Always use sunscreen. Apply sunscreen liberally and repeatedly throughout the day.  Protect yourself by wearing long  sleeves, pants, a wide-brimmed hat, and sunglasses whenever you are outside. Heart disease, diabetes, and high blood pressure  High blood pressure causes heart disease and increases the risk of stroke. High blood pressure is more likely to develop in:  People who have blood pressure in the high end of the normal range (130-139/85-89 mm Hg).  People who are overweight or obese.  People who are African American.  If you are 63-58 years of age, have your blood pressure checked every 3-5 years. If you are 42 years of age or older, have your blood pressure checked every year. You should have your blood pressure measured twice-once when you are at a hospital or clinic, and once when you are not at a hospital or clinic. Record the average of the two measurements. To check your blood pressure when you are not at a hospital or clinic, you can use:  An automated blood pressure machine at a pharmacy.  A home blood pressure monitor.  If you  are between 33 years and 35 years old, ask your health care provider if you should take aspirin to prevent strokes.  Have regular diabetes screenings. This involves taking a blood sample to check your fasting blood sugar level.  If you are at a normal weight and have a low risk for diabetes, have this test once every three years after 56 years of age.  If you are overweight and have a high risk for diabetes, consider being tested at a younger age or more often. Preventing infection Hepatitis B  If you have a higher risk for hepatitis B, you should be screened for this virus. You are considered at high risk for hepatitis B if:  You were born in a country where hepatitis B is common. Ask your health care provider which countries are considered high risk.  Your parents were born in a high-risk country, and you have not been immunized against hepatitis B (hepatitis B vaccine).  You have HIV or AIDS.  You use needles to inject street drugs.  You live with someone who has hepatitis B.  You have had sex with someone who has hepatitis B.  You get hemodialysis treatment.  You take certain medicines for conditions, including cancer, organ transplantation, and autoimmune conditions. Hepatitis C  Blood testing is recommended for:  Everyone born from [birth year] through 1965.  Anyone with known risk factors for hepatitis C. Sexually transmitted infections (STIs)  You should be screened for sexually transmitted infections (STIs) including gonorrhea and chlamydia if:  You are sexually active and are younger than 56 years of age.  You are older than 56 years of age and your health care provider tells you that you are at risk for this type of infection.  Your sexual activity has changed since you were last screened and you are at an increased risk for chlamydia or gonorrhea. Ask your health care provider if you are at risk.  If you do not have HIV, but are at risk, it may be recommended that you  take a prescription medicine daily to prevent HIV infection. This is called pre-exposure prophylaxis (PrEP). You are considered at risk if:  You are sexually active and do not regularly use condoms or know the HIV status of your partner(s).  You take drugs by injection.  You are sexually active with a partner who has HIV. Talk with your health care provider about whether you are at high risk of being infected with HIV. If you choose to begin  PrEP, you should first be tested for HIV. You should then be tested every 3 months for as long as you are taking PrEP. Pregnancy  If you are premenopausal and you may become pregnant, ask your health care provider about preconception counseling.  If you may become pregnant, take 400 to 800 micrograms (mcg) of folic acid every day.  If you want to prevent pregnancy, talk to your health care provider about birth control (contraception). Osteoporosis and menopause  Osteoporosis is a disease in which the bones lose minerals and strength with aging. This can result in serious bone fractures. Your risk for osteoporosis can be identified using a bone density scan.  If you are 93 years of age or older, or if you are at risk for osteoporosis and fractures, ask your health care provider if you should be screened.  Ask your health care provider whether you should take a calcium or vitamin D supplement to lower your risk for osteoporosis.  Menopause may have certain physical symptoms and risks.  Hormone replacement therapy may reduce some of these symptoms and risks. Talk to your health care provider about whether hormone replacement therapy is right for you. Follow these instructions at home:  Schedule regular health, dental, and eye exams.  Stay current with your immunizations.  Do not use any tobacco products including cigarettes, chewing tobacco, or electronic cigarettes.  If you are pregnant, do not drink alcohol.  If you are breastfeeding, limit  how much and how often you drink alcohol.  Limit alcohol intake to no more than 1 drink per day for nonpregnant women. One drink equals 12 ounces of beer, 5 ounces of wine, or 1 ounces of hard liquor.  Do not use street drugs.  Do not share needles.  Ask your health care provider for help if you need support or information about quitting drugs.  Tell your health care provider if you often feel depressed.  Tell your health care provider if you have ever been abused or do not feel safe at home. This information is not intended to replace advice given to you by your health care provider. Make sure you discuss any questions you have with your health care provider. Document Released: 07/19/2010 Document Revised: 06/11/2015 Document Reviewed: 10/07/2014 Elsevier Interactive Patient Education  2017 [employer].   Steps to Quit Smoking Smoking tobacco can be harmful to your health and can affect almost every organ in your body. Smoking puts you, and those around you, at risk for developing many serious chronic diseases. Quitting smoking is difficult, but it is one of the best things that you can do for your health. It is never too late to quit. What are the benefits of quitting smoking? When you quit smoking, you lower your risk of developing serious diseases and conditions, such as:  Lung cancer or lung disease, such as COPD.  Heart disease.  Stroke.  Heart attack.  Infertility.  Osteoporosis and bone fractures. Additionally, symptoms such as coughing, wheezing, and shortness of breath may get better when you quit. You may also find that you get sick less often because your body is stronger at fighting off colds and infections. If you are pregnant, quitting smoking can help to reduce your chances of having a baby of low birth weight. How do I get ready to quit? When you decide to quit smoking, create a plan to make sure that you are successful. Before you quit:  Pick a date to quit.  Set a date within  the next two weeks to give you time to prepare.  Write down the reasons why you are quitting. Keep this list in places where you will see it often, such as on your bathroom mirror or in your car or wallet.  Identify the people, places, things, and activities that make you want to smoke (triggers) and avoid them. Make sure to take these actions:  Throw away all cigarettes at home, at work, and in your car.  Throw away smoking accessories, such as Scientist, research (medical).  Clean your car and make sure to empty the ashtray.  Clean your home, including curtains and carpets.  Tell your family, friends, and coworkers that you are quitting. Support from your loved ones can make quitting easier.  Talk with your health care provider about your options for quitting smoking.  Find out what treatment options are covered by your health insurance. What strategies can I use to quit smoking? Talk with your healthcare provider about different strategies to quit smoking. Some strategies include:  Quitting smoking altogether instead of gradually lessening how much you smoke over a period of time. Research shows that quitting "cold [place]" is more successful than gradually quitting.  Attending in-person counseling to help you build problem-solving skills. You are more likely to have success in quitting if you attend several counseling sessions. Even short sessions of 10 minutes can be effective.  Finding resources and support systems that can help you to quit smoking and remain smoke-free after you quit. These resources are most helpful when you use them often. They can include:  Online chats with a Social worker.  Telephone quitlines.  Printed Furniture conservator/restorer.  Support groups or group counseling.  Text messaging programs.  Mobile phone applications.  Taking medicines to help you quit smoking. (If you are pregnant or breastfeeding, talk with your health care provider first.) Some  medicines contain nicotine and some do not. Both types of medicines help with cravings, but the medicines that include nicotine help to relieve withdrawal symptoms. Your health care provider may recommend:  Nicotine patches, gum, or lozenges.  Nicotine inhalers or sprays.  Non-nicotine medicine that is taken by mouth. Talk with your health care provider about combining strategies, such as taking medicines while you are also receiving in-person counseling. Using these two strategies together makes you more likely to succeed in quitting than if you used either strategy on its own. If you are pregnant or breastfeeding, talk with your health care provider about finding counseling or other support strategies to quit smoking. Do not take medicine to help you quit smoking unless told to do so by your health care provider. What things can I do to make it easier to quit? Quitting smoking might feel overwhelming at first, but there is a lot that you can do to make it easier. Take these important actions:  Reach out to your family and friends and ask that they support and encourage you during this time. Call telephone quitlines, reach out to support groups, or work with a counselor for support.  Ask people who smoke to avoid smoking around you.  Avoid places that trigger you to smoke, such as bars, parties, or smoke-break areas at work.  Spend time around people who do not smoke.  Lessen stress in your life, because stress can be a smoking trigger for some people. To lessen stress, try:  Exercising regularly.  Deep-breathing exercises.  Yoga.  Meditating.  Performing a body scan. This involves closing your eyes, scanning your  body from head to toe, and noticing which parts of your body are particularly tense. Purposefully relax the muscles in those areas.  Download or purchase mobile phone or tablet apps (applications) that can help you stick to your quit plan by providing reminders, tips, and  encouragement. There are many free apps, such as QuitGuide from the [employer] Office manager for Disease Control and Prevention). You can find other support for quitting smoking (smoking cessation) through smokefree.gov and other websites. How will I feel when I quit smoking? Within the first 24 hours of quitting smoking, you may start to feel some withdrawal symptoms. These symptoms are usually most noticeable 2-3 days after quitting, but they usually do not last beyond 2-3 weeks. Changes or symptoms that you might experience include:  Mood swings.  Restlessness, anxiety, or irritation.  Difficulty concentrating.  Dizziness.  Strong cravings for sugary foods in addition to nicotine.  Mild weight gain.  Constipation.  Nausea.  Coughing or a sore throat.  Changes in how your medicines work in your body.  A depressed mood.  Difficulty sleeping (insomnia). After the first 2-3 weeks of quitting, you may start to notice more positive results, such as:  Improved sense of smell and taste.  Decreased coughing and sore throat.  Slower heart rate.  Lower blood pressure.  Clearer skin.  The ability to breathe more easily.  Fewer sick days. Quitting smoking is very challenging for most people. Do not get discouraged if you are not successful the first time. Some people need to make many attempts to quit before they achieve long-term success. Do your best to stick to your quit plan, and talk with your health care provider if you have any questions or concerns. This information is not intended to replace advice given to you by your health care provider. Make sure you discuss any questions you have with your health care provider. Document Released: 12/28/2000 Document Revised: 09/01/2015 Document Reviewed: 05/20/2014 Elsevier Interactive Patient Education  2017 [employer].

## 2016-06-01 NOTE — Assessment & Plan Note (Addendum)
Mammogram ordered, she will return to gyn for pap smear. Colonoscopy due in 2025. Immunizations are up to date and she gets flu shot at dialysis. HIV and hep c screening are done for transplant list and current. Counseled about home safety, counseled about need to stop smoking and she is working on this. Given 10 year screening recommendations.

## 2016-06-01 NOTE — Progress Notes (Signed)
   Subjective:    Patient ID: [name], female    DOB: 07/14/1960, 56 y.o.   MRN: [id number]  HPI Here for medicare wellness, no new complaints. Please see A/P for status and treatment of chronic medical problems.   Diet: renal Physical activity: active Depression/mood screen: negative Hearing: intact to whispered voice Visual acuity: grossly normal with lens, performs annual eye exam  ADLs: capable Fall risk: none Home safety: good Cognitive evaluation: intact to orientation, naming, recall and repetition EOL planning: adv directives discussed, in place  I have personally reviewed and have noted 1. The patient's medical and social history - reviewed today no changes 2. Their use of alcohol, tobacco or illicit drugs 3. Their current medications and supplements 4. The patient's functional ability including ADL's, fall risks, home safety risks and hearing or visual impairment. 5. Diet and physical activities 6. Evidence for depression or mood disorders 7. Care team reviewed and updated (available in snapshot)  Review of Systems  Constitutional: Negative.   HENT: Negative.   Eyes: Negative.   Respiratory: Negative for cough, chest tightness and shortness of breath.   Cardiovascular: Negative for chest pain, palpitations and leg swelling.  Gastrointestinal: Negative for abdominal distention, abdominal pain, constipation, diarrhea, nausea and vomiting.  Musculoskeletal: Negative.   Skin: Negative.   Neurological: Negative.   Psychiatric/Behavioral: Negative.       Objective:   Physical Exam  Constitutional: She is oriented to person, place, and time. She appears well-developed and well-nourished.  HENT:  Head: Normocephalic and atraumatic.  Eyes: EOM are normal.  Neck: Normal range of motion.  Cardiovascular: Normal rate and regular rhythm.   Pulmonary/Chest: Effort normal and breath sounds normal. No respiratory distress. She has no wheezes. She has no rales.  Abdominal:  Soft. Bowel sounds are normal. She exhibits no distension. There is no tenderness. There is no rebound.  Musculoskeletal: She exhibits no edema.  Neurological: She is alert and oriented to person, place, and time. Coordination normal.  Skin: Skin is warm and dry.  Psychiatric: She has a normal mood and affect.   Vitals:   06/01/16 0758  BP: 130/70  Pulse: 79  Resp: 12  Temp: 98.3 F (36.8 C)  TempSrc: Oral  SpO2: 100%  Weight: 143 lb (64.9 kg)  Height: 5\' 7"  (1.702 m)      Assessment & Plan:

## 2016-06-02 DIAGNOSIS — N2581 Secondary hyperparathyroidism of renal origin: Secondary | ICD-10-CM | POA: Diagnosis not present

## 2016-06-02 DIAGNOSIS — N186 End stage renal disease: Secondary | ICD-10-CM | POA: Diagnosis not present

## 2016-06-04 DIAGNOSIS — N186 End stage renal disease: Secondary | ICD-10-CM | POA: Diagnosis not present

## 2016-06-04 DIAGNOSIS — N2581 Secondary hyperparathyroidism of renal origin: Secondary | ICD-10-CM | POA: Diagnosis not present

## 2016-06-07 DIAGNOSIS — N186 End stage renal disease: Secondary | ICD-10-CM | POA: Diagnosis not present

## 2016-06-07 DIAGNOSIS — N2581 Secondary hyperparathyroidism of renal origin: Secondary | ICD-10-CM | POA: Diagnosis not present

## 2016-06-08 ENCOUNTER — Telehealth: Payer: Self-pay | Admitting: Internal Medicine

## 2016-06-08 NOTE — Telephone Encounter (Signed)
Pt was seen last Wednesday and states Sharlet Salina was suppose to enter a referral for a neck specialists. Would like a referral put in for that.

## 2016-06-09 DIAGNOSIS — N2581 Secondary hyperparathyroidism of renal origin: Secondary | ICD-10-CM | POA: Diagnosis not present

## 2016-06-09 DIAGNOSIS — N186 End stage renal disease: Secondary | ICD-10-CM | POA: Diagnosis not present

## 2016-06-09 NOTE — Telephone Encounter (Signed)
Patient contacted and stated awareness 

## 2016-06-09 NOTE — Telephone Encounter (Signed)
This referral was done on 05/20/16 and awaiting a response from the neck specialist.

## 2016-06-11 DIAGNOSIS — N2581 Secondary hyperparathyroidism of renal origin: Secondary | ICD-10-CM | POA: Diagnosis not present

## 2016-06-11 DIAGNOSIS — N186 End stage renal disease: Secondary | ICD-10-CM | POA: Diagnosis not present

## 2016-06-14 DIAGNOSIS — N2581 Secondary hyperparathyroidism of renal origin: Secondary | ICD-10-CM | POA: Diagnosis not present

## 2016-06-14 DIAGNOSIS — N186 End stage renal disease: Secondary | ICD-10-CM | POA: Diagnosis not present

## 2016-06-16 DIAGNOSIS — N186 End stage renal disease: Secondary | ICD-10-CM | POA: Diagnosis not present

## 2016-06-16 DIAGNOSIS — N2581 Secondary hyperparathyroidism of renal origin: Secondary | ICD-10-CM | POA: Diagnosis not present

## 2016-06-16 DIAGNOSIS — N033 Chronic nephritic syndrome with diffuse mesangial proliferative glomerulonephritis: Secondary | ICD-10-CM | POA: Diagnosis not present

## 2016-06-16 DIAGNOSIS — Z992 Dependence on renal dialysis: Secondary | ICD-10-CM | POA: Diagnosis not present

## 2016-06-20 DIAGNOSIS — M4722 Other spondylosis with radiculopathy, cervical region: Secondary | ICD-10-CM | POA: Diagnosis not present

## 2016-06-20 DIAGNOSIS — G5601 Carpal tunnel syndrome, right upper limb: Secondary | ICD-10-CM | POA: Diagnosis not present

## 2016-06-20 DIAGNOSIS — N2581 Secondary hyperparathyroidism of renal origin: Secondary | ICD-10-CM | POA: Diagnosis not present

## 2016-06-20 DIAGNOSIS — G5602 Carpal tunnel syndrome, left upper limb: Secondary | ICD-10-CM | POA: Diagnosis not present

## 2016-06-20 DIAGNOSIS — N186 End stage renal disease: Secondary | ICD-10-CM | POA: Diagnosis not present

## 2016-06-23 ENCOUNTER — Other Ambulatory Visit: Payer: Self-pay | Admitting: Neurosurgery

## 2016-06-23 DIAGNOSIS — M4722 Other spondylosis with radiculopathy, cervical region: Secondary | ICD-10-CM

## 2016-06-23 DIAGNOSIS — N186 End stage renal disease: Secondary | ICD-10-CM | POA: Diagnosis not present

## 2016-06-23 DIAGNOSIS — N2581 Secondary hyperparathyroidism of renal origin: Secondary | ICD-10-CM | POA: Diagnosis not present

## 2016-06-25 DIAGNOSIS — N186 End stage renal disease: Secondary | ICD-10-CM | POA: Diagnosis not present

## 2016-06-25 DIAGNOSIS — N2581 Secondary hyperparathyroidism of renal origin: Secondary | ICD-10-CM | POA: Diagnosis not present

## 2016-06-28 DIAGNOSIS — N2581 Secondary hyperparathyroidism of renal origin: Secondary | ICD-10-CM | POA: Diagnosis not present

## 2016-06-28 DIAGNOSIS — N186 End stage renal disease: Secondary | ICD-10-CM | POA: Diagnosis not present

## 2016-06-29 DIAGNOSIS — G5603 Carpal tunnel syndrome, bilateral upper limbs: Secondary | ICD-10-CM | POA: Diagnosis not present

## 2016-06-29 DIAGNOSIS — M9981 Other biomechanical lesions of cervical region: Secondary | ICD-10-CM | POA: Diagnosis not present

## 2016-06-30 DIAGNOSIS — N186 End stage renal disease: Secondary | ICD-10-CM | POA: Diagnosis not present

## 2016-06-30 DIAGNOSIS — N2581 Secondary hyperparathyroidism of renal origin: Secondary | ICD-10-CM | POA: Diagnosis not present

## 2016-07-02 DIAGNOSIS — N2581 Secondary hyperparathyroidism of renal origin: Secondary | ICD-10-CM | POA: Diagnosis not present

## 2016-07-02 DIAGNOSIS — N186 End stage renal disease: Secondary | ICD-10-CM | POA: Diagnosis not present

## 2016-07-05 DIAGNOSIS — N186 End stage renal disease: Secondary | ICD-10-CM | POA: Diagnosis not present

## 2016-07-05 DIAGNOSIS — N2581 Secondary hyperparathyroidism of renal origin: Secondary | ICD-10-CM | POA: Diagnosis not present

## 2016-07-07 DIAGNOSIS — N2581 Secondary hyperparathyroidism of renal origin: Secondary | ICD-10-CM | POA: Diagnosis not present

## 2016-07-07 DIAGNOSIS — N186 End stage renal disease: Secondary | ICD-10-CM | POA: Diagnosis not present

## 2016-07-09 DIAGNOSIS — N2581 Secondary hyperparathyroidism of renal origin: Secondary | ICD-10-CM | POA: Diagnosis not present

## 2016-07-09 DIAGNOSIS — N186 End stage renal disease: Secondary | ICD-10-CM | POA: Diagnosis not present

## 2016-07-12 DIAGNOSIS — N186 End stage renal disease: Secondary | ICD-10-CM | POA: Diagnosis not present

## 2016-07-12 DIAGNOSIS — N2581 Secondary hyperparathyroidism of renal origin: Secondary | ICD-10-CM | POA: Diagnosis not present

## 2016-07-14 DIAGNOSIS — N186 End stage renal disease: Secondary | ICD-10-CM | POA: Diagnosis not present

## 2016-07-14 DIAGNOSIS — N2581 Secondary hyperparathyroidism of renal origin: Secondary | ICD-10-CM | POA: Diagnosis not present

## 2016-07-16 DIAGNOSIS — N2581 Secondary hyperparathyroidism of renal origin: Secondary | ICD-10-CM | POA: Diagnosis not present

## 2016-07-16 DIAGNOSIS — N186 End stage renal disease: Secondary | ICD-10-CM | POA: Diagnosis not present

## 2016-07-16 DIAGNOSIS — N033 Chronic nephritic syndrome with diffuse mesangial proliferative glomerulonephritis: Secondary | ICD-10-CM | POA: Diagnosis not present

## 2016-07-16 DIAGNOSIS — Z992 Dependence on renal dialysis: Secondary | ICD-10-CM | POA: Diagnosis not present

## 2016-07-19 DIAGNOSIS — D631 Anemia in chronic kidney disease: Secondary | ICD-10-CM | POA: Diagnosis not present

## 2016-07-19 DIAGNOSIS — N186 End stage renal disease: Secondary | ICD-10-CM | POA: Diagnosis not present

## 2016-07-19 DIAGNOSIS — N2581 Secondary hyperparathyroidism of renal origin: Secondary | ICD-10-CM | POA: Diagnosis not present

## 2016-07-21 DIAGNOSIS — N186 End stage renal disease: Secondary | ICD-10-CM | POA: Diagnosis not present

## 2016-07-21 DIAGNOSIS — N2581 Secondary hyperparathyroidism of renal origin: Secondary | ICD-10-CM | POA: Diagnosis not present

## 2016-07-21 DIAGNOSIS — D631 Anemia in chronic kidney disease: Secondary | ICD-10-CM | POA: Diagnosis not present

## 2016-07-23 DIAGNOSIS — N2581 Secondary hyperparathyroidism of renal origin: Secondary | ICD-10-CM | POA: Diagnosis not present

## 2016-07-23 DIAGNOSIS — D631 Anemia in chronic kidney disease: Secondary | ICD-10-CM | POA: Diagnosis not present

## 2016-07-23 DIAGNOSIS — N186 End stage renal disease: Secondary | ICD-10-CM | POA: Diagnosis not present

## 2016-07-25 ENCOUNTER — Ambulatory Visit
Admission: RE | Admit: 2016-07-25 | Discharge: 2016-07-25 | Disposition: A | Discharge status: Home / Self Care | Payer: Medicare Other | Source: Ambulatory Visit | Attending: Neurosurgery | Admitting: Neurosurgery

## 2016-07-25 DIAGNOSIS — M4722 Other spondylosis with radiculopathy, cervical region: Secondary | ICD-10-CM

## 2016-07-25 DIAGNOSIS — M50221 Other cervical disc displacement at C4-C5 level: Secondary | ICD-10-CM | POA: Diagnosis not present

## 2016-07-26 DIAGNOSIS — M9981 Other biomechanical lesions of cervical region: Secondary | ICD-10-CM | POA: Diagnosis not present

## 2016-07-26 DIAGNOSIS — D631 Anemia in chronic kidney disease: Secondary | ICD-10-CM | POA: Diagnosis not present

## 2016-07-26 DIAGNOSIS — N2581 Secondary hyperparathyroidism of renal origin: Secondary | ICD-10-CM | POA: Diagnosis not present

## 2016-07-26 DIAGNOSIS — M4802 Spinal stenosis, cervical region: Secondary | ICD-10-CM | POA: Diagnosis not present

## 2016-07-26 DIAGNOSIS — N186 End stage renal disease: Secondary | ICD-10-CM | POA: Diagnosis not present

## 2016-07-27 ENCOUNTER — Other Ambulatory Visit: Payer: Self-pay | Admitting: Neurosurgery

## 2016-07-28 DIAGNOSIS — D631 Anemia in chronic kidney disease: Secondary | ICD-10-CM | POA: Diagnosis not present

## 2016-07-28 DIAGNOSIS — N186 End stage renal disease: Secondary | ICD-10-CM | POA: Diagnosis not present

## 2016-07-28 DIAGNOSIS — N2581 Secondary hyperparathyroidism of renal origin: Secondary | ICD-10-CM | POA: Diagnosis not present

## 2016-07-30 DIAGNOSIS — D631 Anemia in chronic kidney disease: Secondary | ICD-10-CM | POA: Diagnosis not present

## 2016-07-30 DIAGNOSIS — N2581 Secondary hyperparathyroidism of renal origin: Secondary | ICD-10-CM | POA: Diagnosis not present

## 2016-07-30 DIAGNOSIS — N186 End stage renal disease: Secondary | ICD-10-CM | POA: Diagnosis not present

## 2016-08-02 DIAGNOSIS — D631 Anemia in chronic kidney disease: Secondary | ICD-10-CM | POA: Diagnosis not present

## 2016-08-02 DIAGNOSIS — N186 End stage renal disease: Secondary | ICD-10-CM | POA: Diagnosis not present

## 2016-08-02 DIAGNOSIS — N2581 Secondary hyperparathyroidism of renal origin: Secondary | ICD-10-CM | POA: Diagnosis not present

## 2016-08-04 ENCOUNTER — Ambulatory Visit: Payer: Medicare Other

## 2016-08-04 DIAGNOSIS — N2581 Secondary hyperparathyroidism of renal origin: Secondary | ICD-10-CM | POA: Diagnosis not present

## 2016-08-04 DIAGNOSIS — N186 End stage renal disease: Secondary | ICD-10-CM | POA: Diagnosis not present

## 2016-08-04 DIAGNOSIS — D631 Anemia in chronic kidney disease: Secondary | ICD-10-CM | POA: Diagnosis not present

## 2016-08-05 NOTE — Pre-Procedure Instructions (Signed)
[name]  08/05/2016      [id number] [address], [place] [address] Phone: [phone] Fax: [phone]    Your procedure is scheduled on Friday, July 27th   Report to [facility] Admitting at 1:00 PM             9posted surgery time 3:00 pm - 5:19 pm)   Call this number if you have problems the [place]  [phone], other questions call [place] Fri from 8a-4p   Remember:   Do not eat food or drink liquids after midnight Thursday.   Take these medicines the morning of surgery with A SIP OF WATER : Xanax               4-5 days prior to surgery, STOP taking any vitamins, herbal supplements, anti-inflammatories.   Do not wear jewelry, make-up or nail polish.  Do not wear lotions, powders,  perfumes, or deoderant.  Do not shave 48 hours prior to surgery.    Do not bring valuables to the hospital.  [facility] is not responsible for any belongings or valuables.  Contacts, dentures or bridgework may not be worn into surgery.  Leave your suitcase in the car.  After surgery it may be brought to your room.  For patients admitted to the hospital, discharge time will be determined by your treatment team.  Please read over the following fact sheets that you were given. Pain Booklet, MRSA Information and Surgical Site Infection Prevention       [place]- Preparing For Surgery  Before surgery, you can play an important role. Because skin is not sterile, your skin needs to be as free of germs as possible. You can reduce the number of germs on your skin by washing with CHG (chlorahexidine gluconate) Soap before surgery.  CHG is an antiseptic cleaner which kills germs and bonds with the skin to continue killing germs even after washing.  Please do not use if you have an allergy to CHG or antibacterial soaps. If your skin becomes reddened/irritated stop using the CHG.  Do not  shave (including legs and underarms) for at least 48 hours prior to first CHG shower. It is OK to shave your face.  Please follow these instructions carefully.   1. Shower the NIGHT BEFORE SURGERY and the MORNING OF SURGERY with CHG.   2. If you chose to wash your hair, wash your hair first as usual with your normal shampoo.  3. After you shampoo, rinse your hair and body thoroughly to remove the shampoo.  4. Use CHG as you would any other liquid soap. You can apply CHG directly to the skin and wash gently with a scrungie or a clean washcloth.   5. Apply the CHG Soap to your body ONLY FROM THE NECK DOWN.  Do not use on open wounds or open sores. Avoid contact with your eyes, ears, mouth and genitals (private parts). Wash genitals (private parts) with your normal soap.  6. Wash thoroughly, paying special attention to the area where your surgery will be performed.  7. Thoroughly rinse your body with warm water from the neck down.  8. DO NOT shower/wash with your normal soap after using and rinsing off the CHG Soap.  9. Pat yourself dry with a CLEAN TOWEL.   10. Wear CLEAN PAJAMAS   11. Place CLEAN SHEETS on your bed the night  of your first shower and DO NOT SLEEP WITH PETS.    Day of Surgery: Do not apply any deodorants/lotions. Please wear clean clothes to the hospital/surgery center.

## 2016-08-06 DIAGNOSIS — N2581 Secondary hyperparathyroidism of renal origin: Secondary | ICD-10-CM | POA: Diagnosis not present

## 2016-08-06 DIAGNOSIS — D631 Anemia in chronic kidney disease: Secondary | ICD-10-CM | POA: Diagnosis not present

## 2016-08-06 DIAGNOSIS — N186 End stage renal disease: Secondary | ICD-10-CM | POA: Diagnosis not present

## 2016-08-08 ENCOUNTER — Encounter (HOSPITAL_COMMUNITY): Payer: Self-pay

## 2016-08-08 ENCOUNTER — Encounter (HOSPITAL_COMMUNITY)
Admission: RE | Admit: 2016-08-08 | Discharge: 2016-08-08 | Disposition: A | Discharge status: Home / Self Care | Payer: Medicare Other | Source: Ambulatory Visit | Attending: Neurosurgery | Admitting: Neurosurgery

## 2016-08-08 DIAGNOSIS — F419 Anxiety disorder, unspecified: Secondary | ICD-10-CM | POA: Insufficient documentation

## 2016-08-08 DIAGNOSIS — Z01812 Encounter for preprocedural laboratory examination: Secondary | ICD-10-CM | POA: Diagnosis not present

## 2016-08-08 DIAGNOSIS — I120 Hypertensive chronic kidney disease with stage 5 chronic kidney disease or end stage renal disease: Secondary | ICD-10-CM | POA: Diagnosis not present

## 2016-08-08 DIAGNOSIS — R011 Cardiac murmur, unspecified: Secondary | ICD-10-CM | POA: Insufficient documentation

## 2016-08-08 DIAGNOSIS — D631 Anemia in chronic kidney disease: Secondary | ICD-10-CM | POA: Diagnosis not present

## 2016-08-08 DIAGNOSIS — Z992 Dependence on renal dialysis: Secondary | ICD-10-CM | POA: Insufficient documentation

## 2016-08-08 DIAGNOSIS — N186 End stage renal disease: Secondary | ICD-10-CM | POA: Diagnosis not present

## 2016-08-08 DIAGNOSIS — Z87891 Personal history of nicotine dependence: Secondary | ICD-10-CM | POA: Insufficient documentation

## 2016-08-08 DIAGNOSIS — E039 Hypothyroidism, unspecified: Secondary | ICD-10-CM | POA: Insufficient documentation

## 2016-08-08 HISTORY — DX: Unspecified osteoarthritis, unspecified site: M19.90

## 2016-08-08 LAB — BASIC METABOLIC PANEL
ANION GAP: 10 (ref 5–15)
BUN: 23 mg/dL — AB (ref 6–20)
CHLORIDE: 97 mmol/L — AB (ref 101–111)
CO2: 27 mmol/L (ref 22–32)
Calcium: 9.8 mg/dL (ref 8.9–10.3)
Creatinine, Ser: 9.67 mg/dL — ABNORMAL HIGH (ref 0.44–1.00)
GFR calc Af Amer: 5 mL/min — ABNORMAL LOW (ref >60)
GFR, EST NON AFRICAN AMERICAN: 4 mL/min — AB (ref >60)
GLUCOSE: 81 mg/dL (ref 65–99)
POTASSIUM: 4.4 mmol/L (ref 3.5–5.1)
Sodium: 134 mmol/L — ABNORMAL LOW (ref 135–145)

## 2016-08-08 LAB — CBC
HCT: 33.9 % — ABNORMAL LOW (ref 36.0–46.0)
HEMOGLOBIN: 11.1 g/dL — AB (ref 12.0–15.0)
MCH: 29.6 pg (ref 26.0–34.0)
MCHC: 32.7 g/dL (ref 30.0–36.0)
MCV: 90.4 fL (ref 78.0–100.0)
PLATELETS: 229 10*3/uL (ref 150–400)
RBC: 3.75 MIL/uL — AB (ref 3.87–5.11)
RDW: 13.9 % (ref 11.5–15.5)
WBC: 5.9 10*3/uL (ref 4.0–10.5)

## 2016-08-08 LAB — SURGICAL PCR SCREEN
MRSA, PCR: NEGATIVE
Staphylococcus aureus: NEGATIVE

## 2016-08-08 NOTE — Progress Notes (Signed)
PCP is Dr. Pricilla Holm  [place] 04/2016 Nephrologist is Dr. Dian Situ 07/2016   Has Dialysis Tues-Thur-Sat  Graft is in the right forearm. Has not seen a cardio, but has had cardiac tests done at [facility] since is currently on transplant list. (req EKG tracing, stress test results, anything cardiac).  She was told she has murmur, but not symptomatic

## 2016-08-09 DIAGNOSIS — D631 Anemia in chronic kidney disease: Secondary | ICD-10-CM | POA: Diagnosis not present

## 2016-08-09 DIAGNOSIS — N2581 Secondary hyperparathyroidism of renal origin: Secondary | ICD-10-CM | POA: Diagnosis not present

## 2016-08-09 DIAGNOSIS — N186 End stage renal disease: Secondary | ICD-10-CM | POA: Diagnosis not present

## 2016-08-09 NOTE — Progress Notes (Addendum)
Anesthesia chart review: Patient is a 56 year old female scheduled for posterior cervical laminectomy, C3-4, C4-5, C5-6, C6-7 on 08/12/2016 by Dr. Christella Noa.  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   - PCP is Dr. Pricilla Holm. - Nephrologist is Dr. Donato Heinz. She is also on a renal transplant list at [facility]. - She does not routinely see a cardiologist, but saw Dr. Johnsie Cancel in 2015 for tachycardia felt due to flow rate during dialysis. She get periodic cardiac testing at [facility] since she is on the renal transplant list.   Meds include Xanax, Phoslo.  BP (!) 170/84   Pulse 82   Temp 36.7 C   Resp 20   Ht 5\' 7"  (1.702 m)   Wt 146 lb 8 oz (66.5 kg)   SpO2 100%   BMI 22.95 kg/m   EKG 02/15/16 [facility]): Result Narrative ONLY (tracing requested):  Ventricular Rate 71BPM  Atrial Rate71BPM  P-R Interval 166 ms QRS Duration 26ms Q-T Interval 376 ms QTC408 ms P Axis 72degrees  R Axis 76degrees  T Axis 78degrees  Sinus rhythm Early repolarization Normal ECG When compared with ECG of 23-Sep-2015 12:17, T wave amplitude has increased in anterior leads Confirmed by HAISTY, DR. W. K. ([id number]) on 02/15/2016 9:00:05 PM   Carotid U/S 09/25/15 [facility]): Minimal  plaque formation of bilateral bulbs. Velocities within normal limites. Bilateral 20-39% diameter reduction, no significant BICA stenosis. Bilateral vertebral artery flow is antegrade. Has RUE AVF.    Nuclear stress test 11/24/14 [facility]; Care Everywhere): No inducible ischemia. Normal left ventricular function. EF > 70%.  Stress echo 10/29/14 [facility]; Care Everywhere):  There was normal increase in global LV function post exercise. Normal left  ventricular function and global wall motion with stress. The estimated LV  ejection fraction is >70% with stress. Nondiagnostic exercise  echocardiography due to subtarget heart rate.  Echo 10/29/14 [facility]; Care Everywhere): SUMMARY The left ventricular size is normal. Left ventricular systolic function is normal. The left ventricular wall motion is normal. LV ejection fraction = 55-60%. Left ventricular filling pattern is impaired. The right ventricle is normal in size and function. The left atrial size is normal. Right atrial size is normal. Focal thickening of the aortic valve with preserved cusp opening. There is trace aortic regurgitation. There is mild to moderate mitral annular calcification. There is trace mitral regurgitation. The tricuspid valve is normal in structure and function. There is mild tricuspid regurgitation. No pulmonary hypertension. There is no pericardial effusion. Probably no change compared with the prior study.  CXR 02/15/16 [facility]; Care Everywhere): Cardiovascular/Mediastinum: Cardiopericardial silhouette and pulmonary vasculature are within normal limits. Mediastinum is within normal limits. Lungs/pleura: No focal airspace opacity. No pneumothorax. No pleural effusion. Upper abdomen: Visible portions of the upper abdomen are within normal limits. Chest wall/osseous structures: No acute osseous abnormality. Dextroscoliotic curvature of the mid to lower thoracic spine. Partially imaged vascular stent  projects over the right axilla along with surgical clips. Surgical clips project over the neck. Result Impression: There is no evidence of acute cardiac or pulmonary abnormality.  Preoperative labs noted. BUN 23, Cr 9.67. K 4.4. H/H 11.1/33.9. Glucose 81. She will need an ISTAT4 on the day of surgery.  If EKG tracing not received from [facility], then it will need  to be repeated on the day of surgery. If ISTAT results acceptable, EKG stable (if done), and no acute changes then I anticipate that she can proceed as planned.  George Hugh [facility] Short Stay Center/Anesthesiology Phone [phone] 08/10/2016 10:30 AM

## 2016-08-10 ENCOUNTER — Ambulatory Visit
Admission: RE | Admit: 2016-08-10 | Discharge: 2016-08-10 | Disposition: A | Discharge status: Home / Self Care | Payer: Medicare Other | Source: Ambulatory Visit | Attending: Internal Medicine | Admitting: Internal Medicine

## 2016-08-10 DIAGNOSIS — Z1231 Encounter for screening mammogram for malignant neoplasm of breast: Secondary | ICD-10-CM

## 2016-08-11 DIAGNOSIS — N2581 Secondary hyperparathyroidism of renal origin: Secondary | ICD-10-CM | POA: Diagnosis not present

## 2016-08-11 DIAGNOSIS — N186 End stage renal disease: Secondary | ICD-10-CM | POA: Diagnosis not present

## 2016-08-11 DIAGNOSIS — D631 Anemia in chronic kidney disease: Secondary | ICD-10-CM | POA: Diagnosis not present

## 2016-08-11 MED ORDER — VANCOMYCIN HCL IN DEXTROSE 1-5 GM/200ML-% IV SOLN
1000.0000 mg | INTRAVENOUS | Status: AC
Start: 2016-08-12 — End: 2016-08-12
  Administered 2016-08-12: 1000 mg via INTRAVENOUS
  Filled 2016-08-11: qty 200

## 2016-08-12 ENCOUNTER — Encounter (HOSPITAL_COMMUNITY): Payer: Self-pay | Admitting: *Deleted

## 2016-08-12 ENCOUNTER — Inpatient Hospital Stay (HOSPITAL_COMMUNITY): Payer: Medicare Other | Admitting: Vascular Surgery

## 2016-08-12 ENCOUNTER — Encounter (HOSPITAL_COMMUNITY)
Admission: RE | Disposition: A | Discharge status: Home / Self Care | Payer: Self-pay | Source: Ambulatory Visit | Attending: Neurosurgery

## 2016-08-12 ENCOUNTER — Inpatient Hospital Stay (HOSPITAL_COMMUNITY)
Admission: RE | Admit: 2016-08-12 | Discharge: 2016-08-14 | DRG: 518 | Disposition: A | Discharge status: Home / Self Care | Payer: Medicare Other | Source: Ambulatory Visit | Attending: Neurosurgery | Admitting: Neurosurgery

## 2016-08-12 ENCOUNTER — Inpatient Hospital Stay (HOSPITAL_COMMUNITY): Payer: Medicare Other

## 2016-08-12 DIAGNOSIS — Z880 Allergy status to penicillin: Secondary | ICD-10-CM

## 2016-08-12 DIAGNOSIS — N186 End stage renal disease: Secondary | ICD-10-CM | POA: Diagnosis present

## 2016-08-12 DIAGNOSIS — G959 Disease of spinal cord, unspecified: Secondary | ICD-10-CM | POA: Diagnosis not present

## 2016-08-12 DIAGNOSIS — Z940 Kidney transplant status: Secondary | ICD-10-CM | POA: Diagnosis not present

## 2016-08-12 DIAGNOSIS — D649 Anemia, unspecified: Secondary | ICD-10-CM | POA: Diagnosis present

## 2016-08-12 DIAGNOSIS — I120 Hypertensive chronic kidney disease with stage 5 chronic kidney disease or end stage renal disease: Secondary | ICD-10-CM | POA: Diagnosis present

## 2016-08-12 DIAGNOSIS — F419 Anxiety disorder, unspecified: Secondary | ICD-10-CM | POA: Diagnosis present

## 2016-08-12 DIAGNOSIS — M4802 Spinal stenosis, cervical region: Principal | ICD-10-CM | POA: Diagnosis present

## 2016-08-12 DIAGNOSIS — Z992 Dependence on renal dialysis: Secondary | ICD-10-CM

## 2016-08-12 DIAGNOSIS — M1990 Unspecified osteoarthritis, unspecified site: Secondary | ICD-10-CM | POA: Diagnosis present

## 2016-08-12 DIAGNOSIS — N2581 Secondary hyperparathyroidism of renal origin: Secondary | ICD-10-CM | POA: Diagnosis not present

## 2016-08-12 DIAGNOSIS — E039 Hypothyroidism, unspecified: Secondary | ICD-10-CM | POA: Diagnosis present

## 2016-08-12 DIAGNOSIS — E8889 Other specified metabolic disorders: Secondary | ICD-10-CM | POA: Diagnosis not present

## 2016-08-12 DIAGNOSIS — Z885 Allergy status to narcotic agent status: Secondary | ICD-10-CM

## 2016-08-12 DIAGNOSIS — D631 Anemia in chronic kidney disease: Secondary | ICD-10-CM | POA: Diagnosis not present

## 2016-08-12 DIAGNOSIS — G992 Myelopathy in diseases classified elsewhere: Secondary | ICD-10-CM | POA: Diagnosis present

## 2016-08-12 DIAGNOSIS — Z419 Encounter for procedure for purposes other than remedying health state, unspecified: Secondary | ICD-10-CM

## 2016-08-12 HISTORY — PX: POSTERIOR CERVICAL LAMINECTOMY: SHX2248

## 2016-08-12 LAB — POCT I-STAT 4, (NA,K, GLUC, HGB,HCT)
GLUCOSE: 75 mg/dL (ref 65–99)
HCT: 34 % — ABNORMAL LOW (ref 36.0–46.0)
Hemoglobin: 11.6 g/dL — ABNORMAL LOW (ref 12.0–15.0)
Potassium: 4.1 mmol/L (ref 3.5–5.1)
Sodium: 135 mmol/L (ref 135–145)

## 2016-08-12 SURGERY — POSTERIOR CERVICAL LAMINECTOMY
Anesthesia: General | Site: Spine Cervical

## 2016-08-12 MED ORDER — SODIUM CHLORIDE 0.9 % IV SOLN: 250.0000 mL | INTRAVENOUS | Status: DC

## 2016-08-12 MED ORDER — CEFAZOLIN SODIUM-DEXTROSE 1-4 GM/50ML-% IV SOLN
1.0000 g | Freq: Three times a day (TID) | INTRAVENOUS | Status: AC
  Administered 2016-08-12 – 2016-08-13 (×2): 1 g via INTRAVENOUS
  Filled 2016-08-12 (×3): qty 50

## 2016-08-12 MED ORDER — PHENYLEPHRINE HCL 10 MG/ML IJ SOLN
INTRAVENOUS | Status: DC | PRN
  Administered 2016-08-12: 50 ug/min via INTRAVENOUS

## 2016-08-12 MED ORDER — FENTANYL CITRATE (PF) 250 MCG/5ML IJ SOLN
INTRAMUSCULAR | Status: AC
  Filled 2016-08-12: qty 5

## 2016-08-12 MED ORDER — LIDOCAINE 2% (20 MG/ML) 5 ML SYRINGE
INTRAMUSCULAR | Status: AC
  Filled 2016-08-12: qty 5

## 2016-08-12 MED ORDER — HEMOSTATIC AGENTS (NO CHARGE) OPTIME
TOPICAL | Status: DC | PRN
  Administered 2016-08-12: 1 via TOPICAL

## 2016-08-12 MED ORDER — HYDROMORPHONE HCL 1 MG/ML IJ SOLN
0.2500 mg | INTRAMUSCULAR | Status: DC | PRN
  Administered 2016-08-12 (×4): 0.5 mg via INTRAVENOUS

## 2016-08-12 MED ORDER — TRAMADOL HCL 50 MG PO TABS
50.0000 mg | ORAL_TABLET | Freq: Four times a day (QID) | ORAL | Status: DC
  Administered 2016-08-12 – 2016-08-14 (×8): 50 mg via ORAL
  Filled 2016-08-12 (×8): qty 1

## 2016-08-12 MED ORDER — BISACODYL 5 MG PO TBEC
5.0000 mg | DELAYED_RELEASE_TABLET | Freq: Every day | ORAL | Status: DC | PRN
Start: 2016-08-12 — End: 2016-08-14

## 2016-08-12 MED ORDER — ONDANSETRON HCL 4 MG/2ML IJ SOLN: 4.0000 mg | Freq: Four times a day (QID) | INTRAMUSCULAR | Status: DC | PRN

## 2016-08-12 MED ORDER — ONDANSETRON HCL 4 MG/2ML IJ SOLN
INTRAMUSCULAR | Status: AC
  Filled 2016-08-12: qty 2

## 2016-08-12 MED ORDER — DOCUSATE SODIUM 100 MG PO CAPS
100.0000 mg | ORAL_CAPSULE | Freq: Two times a day (BID) | ORAL | Status: DC
  Administered 2016-08-12 – 2016-08-14 (×4): 100 mg via ORAL
  Filled 2016-08-12 (×4): qty 1

## 2016-08-12 MED ORDER — SODIUM CHLORIDE 0.9% FLUSH: 3.0000 mL | INTRAVENOUS | Status: DC | PRN

## 2016-08-12 MED ORDER — ACETAMINOPHEN 650 MG RE SUPP: 650.0000 mg | RECTAL | Status: DC | PRN

## 2016-08-12 MED ORDER — OXYCODONE HCL 5 MG PO TABS
ORAL_TABLET | ORAL | Status: AC
  Filled 2016-08-12: qty 1

## 2016-08-12 MED ORDER — DEXAMETHASONE SODIUM PHOSPHATE 4 MG/ML IJ SOLN
INTRAMUSCULAR | Status: DC | PRN
  Administered 2016-08-12: 6 mg via INTRAVENOUS
  Administered 2016-08-12: 4 mg via INTRAVENOUS

## 2016-08-12 MED ORDER — OXYCODONE HCL 5 MG PO TABS
5.0000 mg | ORAL_TABLET | Freq: Once | ORAL | Status: AC | PRN
  Administered 2016-08-12: 5 mg via ORAL

## 2016-08-12 MED ORDER — SENNOSIDES-DOCUSATE SODIUM 8.6-50 MG PO TABS: 1.0000 | ORAL_TABLET | Freq: Every evening | ORAL | Status: DC | PRN

## 2016-08-12 MED ORDER — PHENOL 1.4 % MT LIQD: 1.0000 | OROMUCOSAL | Status: DC | PRN

## 2016-08-12 MED ORDER — CYCLOBENZAPRINE HCL 10 MG PO TABS
ORAL_TABLET | ORAL | Status: AC
  Filled 2016-08-12: qty 1

## 2016-08-12 MED ORDER — CELECOXIB 200 MG PO CAPS
200.0000 mg | ORAL_CAPSULE | Freq: Two times a day (BID) | ORAL | Status: DC
  Administered 2016-08-12 – 2016-08-14 (×4): 200 mg via ORAL
  Filled 2016-08-12 (×4): qty 1

## 2016-08-12 MED ORDER — ZOLPIDEM TARTRATE 5 MG PO TABS: 5.0000 mg | ORAL_TABLET | Freq: Every evening | ORAL | Status: DC | PRN

## 2016-08-12 MED ORDER — BACITRACIN ZINC 500 UNIT/GM EX OINT
TOPICAL_OINTMENT | CUTANEOUS | Status: DC | PRN
  Administered 2016-08-12: 1 via TOPICAL

## 2016-08-12 MED ORDER — PROPOFOL 10 MG/ML IV BOLUS
INTRAVENOUS | Status: DC | PRN
  Administered 2016-08-12: 150 mg via INTRAVENOUS
  Administered 2016-08-12: 50 mg via INTRAVENOUS

## 2016-08-12 MED ORDER — BACITRACIN ZINC 500 UNIT/GM EX OINT
TOPICAL_OINTMENT | CUTANEOUS | Status: AC
  Filled 2016-08-12: qty 28.35

## 2016-08-12 MED ORDER — SODIUM CHLORIDE 0.9 % IV SOLN
INTRAVENOUS | Status: DC
  Administered 2016-08-12 (×2): via INTRAVENOUS

## 2016-08-12 MED ORDER — POTASSIUM CHLORIDE IN NACL 20-0.9 MEQ/L-% IV SOLN
INTRAVENOUS | Status: DC
  Filled 2016-08-12: qty 1000

## 2016-08-12 MED ORDER — ONDANSETRON HCL 4 MG PO TABS: 4.0000 mg | ORAL_TABLET | Freq: Four times a day (QID) | ORAL | Status: DC | PRN

## 2016-08-12 MED ORDER — OXYCODONE HCL 5 MG/5ML PO SOLN: 5.0000 mg | Freq: Once | ORAL | Status: AC | PRN

## 2016-08-12 MED ORDER — CALCIUM ACETATE (PHOS BINDER) 667 MG PO CAPS
667.0000 mg | ORAL_CAPSULE | Freq: Three times a day (TID) | ORAL | Status: DC
  Administered 2016-08-13 – 2016-08-14 (×4): 667 mg via ORAL
  Filled 2016-08-12 (×4): qty 1

## 2016-08-12 MED ORDER — 0.9 % SODIUM CHLORIDE (POUR BTL) OPTIME
TOPICAL | Status: DC | PRN
  Administered 2016-08-12: 1000 mL

## 2016-08-12 MED ORDER — MIDAZOLAM HCL 2 MG/2ML IJ SOLN
INTRAMUSCULAR | Status: AC
  Filled 2016-08-12: qty 2

## 2016-08-12 MED ORDER — CYCLOBENZAPRINE HCL 10 MG PO TABS
10.0000 mg | ORAL_TABLET | Freq: Three times a day (TID) | ORAL | Status: DC | PRN
  Administered 2016-08-12: 10 mg via ORAL

## 2016-08-12 MED ORDER — THROMBIN 5000 UNITS EX SOLR
CUTANEOUS | Status: AC
  Filled 2016-08-12: qty 5000

## 2016-08-12 MED ORDER — CHLORHEXIDINE GLUCONATE CLOTH 2 % EX PADS: 6.0000 | MEDICATED_PAD | Freq: Once | CUTANEOUS | Status: DC

## 2016-08-12 MED ORDER — THROMBIN 5000 UNITS EX SOLR
CUTANEOUS | Status: AC
  Filled 2016-08-12: qty 10000

## 2016-08-12 MED ORDER — DEXAMETHASONE SODIUM PHOSPHATE 10 MG/ML IJ SOLN
10.0000 mg | Freq: Once | INTRAMUSCULAR | Status: AC
  Administered 2016-08-12: 10 mg via INTRAVENOUS
  Filled 2016-08-12: qty 1

## 2016-08-12 MED ORDER — MENTHOL 3 MG MT LOZG
1.0000 | LOZENGE | OROMUCOSAL | Status: DC | PRN
Start: 2016-08-12 — End: 2016-08-14

## 2016-08-12 MED ORDER — MAGNESIUM CITRATE PO SOLN: 1.0000 | Freq: Once | ORAL | Status: DC | PRN

## 2016-08-12 MED ORDER — SUCCINYLCHOLINE CHLORIDE 200 MG/10ML IV SOSY
PREFILLED_SYRINGE | INTRAVENOUS | Status: AC
  Filled 2016-08-12: qty 10

## 2016-08-12 MED ORDER — ONDANSETRON HCL 4 MG/2ML IJ SOLN
INTRAMUSCULAR | Status: DC | PRN
  Administered 2016-08-12: 4 mg via INTRAVENOUS

## 2016-08-12 MED ORDER — PROPOFOL 10 MG/ML IV BOLUS
INTRAVENOUS | Status: AC
  Filled 2016-08-12: qty 20

## 2016-08-12 MED ORDER — MIDAZOLAM HCL 5 MG/5ML IJ SOLN
INTRAMUSCULAR | Status: DC | PRN
  Administered 2016-08-12: 2 mg via INTRAVENOUS

## 2016-08-12 MED ORDER — LIDOCAINE-EPINEPHRINE 0.5 %-1:200000 IJ SOLN
INTRAMUSCULAR | Status: AC
  Filled 2016-08-12: qty 1

## 2016-08-12 MED ORDER — PROPOFOL 500 MG/50ML IV EMUL
INTRAVENOUS | Status: DC | PRN
  Administered 2016-08-12: 50 ug/kg/min via INTRAVENOUS

## 2016-08-12 MED ORDER — THROMBIN 5000 UNITS EX SOLR
CUTANEOUS | Status: DC | PRN
  Administered 2016-08-12 (×2): 5000 [IU] via TOPICAL

## 2016-08-12 MED ORDER — FENTANYL CITRATE (PF) 100 MCG/2ML IJ SOLN
INTRAMUSCULAR | Status: DC | PRN
  Administered 2016-08-12: 50 ug via INTRAVENOUS
  Administered 2016-08-12: 150 ug via INTRAVENOUS
  Administered 2016-08-12: 100 ug via INTRAVENOUS
  Administered 2016-08-12: 50 ug via INTRAVENOUS

## 2016-08-12 MED ORDER — ALPRAZOLAM 0.25 MG PO TABS: 0.2500 mg | ORAL_TABLET | Freq: Every day | ORAL | Status: DC | PRN

## 2016-08-12 MED ORDER — THROMBIN 5000 UNITS EX SOLR
CUTANEOUS | Status: DC | PRN
  Administered 2016-08-12: 5 mL via TOPICAL

## 2016-08-12 MED ORDER — ACETAMINOPHEN 325 MG PO TABS: 650.0000 mg | ORAL_TABLET | ORAL | Status: DC | PRN

## 2016-08-12 MED ORDER — ONDANSETRON HCL 4 MG/2ML IJ SOLN
4.0000 mg | Freq: Four times a day (QID) | INTRAMUSCULAR | Status: AC | PRN
  Administered 2016-08-12: 4 mg via INTRAVENOUS

## 2016-08-12 MED ORDER — HYDROMORPHONE HCL 1 MG/ML IJ SOLN
INTRAMUSCULAR | Status: AC
  Filled 2016-08-12: qty 1

## 2016-08-12 MED ORDER — SODIUM CHLORIDE 0.9% FLUSH
3.0000 mL | Freq: Two times a day (BID) | INTRAVENOUS | Status: DC
  Administered 2016-08-12 – 2016-08-14 (×3): 3 mL via INTRAVENOUS

## 2016-08-12 MED ORDER — SUGAMMADEX SODIUM 200 MG/2ML IV SOLN
INTRAVENOUS | Status: AC
  Filled 2016-08-12: qty 2

## 2016-08-12 SURGICAL SUPPLY — 78 items
ADH SKN CLS APL DERMABOND .7 (GAUZE/BANDAGES/DRESSINGS) ×1
APL SKNCLS STERI-STRIP NONHPOA (GAUZE/BANDAGES/DRESSINGS)
BAG DECANTER FOR FLEXI CONT (MISCELLANEOUS) ×1 IMPLANT
BENZOIN TINCTURE PRP APPL 2/3 (GAUZE/BANDAGES/DRESSINGS) IMPLANT
BIT DRILL NEURO 2X3.1 SFT TUCH (MISCELLANEOUS) IMPLANT
BLADE CLIPPER SURG (BLADE) ×3 IMPLANT
BLADE ULTRA TIP 2M (BLADE) IMPLANT
BUR MATCHSTICK NEURO 3.0 LAGG (BURR) ×2 IMPLANT
CANISTER SUCT 3000ML PPV (MISCELLANEOUS) ×3 IMPLANT
CARTRIDGE OIL MAESTRO DRILL (MISCELLANEOUS) ×1 IMPLANT
CLOSURE WOUND 1/2 X4 (GAUZE/BANDAGES/DRESSINGS)
DECANTER SPIKE VIAL GLASS SM (MISCELLANEOUS) ×3 IMPLANT
DERMABOND ADVANCED (GAUZE/BANDAGES/DRESSINGS) ×2
DERMABOND ADVANCED .7 DNX12 (GAUZE/BANDAGES/DRESSINGS) IMPLANT
DIFFUSER DRILL AIR PNEUMATIC (MISCELLANEOUS) ×3 IMPLANT
DRAPE LAPAROTOMY 100X72 PEDS (DRAPES) ×3 IMPLANT
DRAPE MICROSCOPE LEICA (MISCELLANEOUS) ×1 IMPLANT
DRAPE POUCH INSTRU U-SHP 10X18 (DRAPES) ×3 IMPLANT
DRILL NEURO 2X3.1 SOFT TOUCH (MISCELLANEOUS)
DRSG OPSITE POSTOP 4X6 (GAUZE/BANDAGES/DRESSINGS) ×3 IMPLANT
DURAPREP 6ML APPLICATOR 50/CS (WOUND CARE) ×6 IMPLANT
ELECT REM PT RETURN 9FT ADLT (ELECTROSURGICAL) ×3
ELECTRODE REM PT RTRN 9FT ADLT (ELECTROSURGICAL) ×1 IMPLANT
GAUZE SPONGE 4X4 16PLY XRAY LF (GAUZE/BANDAGES/DRESSINGS) ×2 IMPLANT
GLOVE BIO SURGEON STRL SZ 6.5 (GLOVE) IMPLANT
GLOVE BIO SURGEON STRL SZ7 (GLOVE) IMPLANT
GLOVE BIO SURGEON STRL SZ7.5 (GLOVE) IMPLANT
GLOVE BIO SURGEON STRL SZ8 (GLOVE) IMPLANT
GLOVE BIO SURGEON STRL SZ8.5 (GLOVE) IMPLANT
GLOVE BIO SURGEONS STRL SZ 6.5 (GLOVE)
GLOVE BIOGEL M 8.0 STRL (GLOVE) IMPLANT
GLOVE BIOGEL PI IND STRL 7.0 (GLOVE) IMPLANT
GLOVE BIOGEL PI IND STRL 7.5 (GLOVE) ×1 IMPLANT
GLOVE BIOGEL PI INDICATOR 7.0 (GLOVE) ×2
GLOVE BIOGEL PI INDICATOR 7.5 (GLOVE) ×2
GLOVE ECLIPSE 6.5 STRL STRAW (GLOVE) ×6 IMPLANT
GLOVE ECLIPSE 7.0 STRL STRAW (GLOVE) ×2 IMPLANT
GLOVE ECLIPSE 7.5 STRL STRAW (GLOVE) IMPLANT
GLOVE ECLIPSE 8.0 STRL XLNG CF (GLOVE) IMPLANT
GLOVE ECLIPSE 8.5 STRL (GLOVE) IMPLANT
GLOVE EXAM NITRILE LRG STRL (GLOVE) IMPLANT
GLOVE EXAM NITRILE XL STR (GLOVE) IMPLANT
GLOVE EXAM NITRILE XS STR PU (GLOVE) IMPLANT
GLOVE INDICATOR 6.5 STRL GRN (GLOVE) IMPLANT
GLOVE INDICATOR 7.0 STRL GRN (GLOVE) IMPLANT
GLOVE INDICATOR 7.5 STRL GRN (GLOVE) IMPLANT
GLOVE INDICATOR 8.0 STRL GRN (GLOVE) IMPLANT
GLOVE INDICATOR 8.5 STRL (GLOVE) IMPLANT
GLOVE OPTIFIT SS 8.0 STRL (GLOVE) IMPLANT
GLOVE SURG SS PI 6.5 STRL IVOR (GLOVE) IMPLANT
GLOVE SURG SS PI 7.5 STRL IVOR (GLOVE) ×9 IMPLANT
GOWN STRL REUS W/ TWL LRG LVL3 (GOWN DISPOSABLE) ×2 IMPLANT
GOWN STRL REUS W/ TWL XL LVL3 (GOWN DISPOSABLE) IMPLANT
GOWN STRL REUS W/TWL 2XL LVL3 (GOWN DISPOSABLE) ×3 IMPLANT
GOWN STRL REUS W/TWL LRG LVL3 (GOWN DISPOSABLE) ×6
GOWN STRL REUS W/TWL XL LVL3 (GOWN DISPOSABLE)
KIT BASIN OR (CUSTOM PROCEDURE TRAY) ×3 IMPLANT
KIT ROOM TURNOVER OR (KITS) ×3 IMPLANT
LIDOCAINE HCL 0.5% AND EPINEPHRINE 1:200,000 INJECTION IMPLANT
MARKER SKIN DUAL TIP RULER LAB (MISCELLANEOUS) ×1 IMPLANT
NEEDLE HYPO 25X1 1.5 SAFETY (NEEDLE) ×3 IMPLANT
NS IRRIG 1000ML POUR BTL (IV SOLUTION) ×3 IMPLANT
OIL CARTRIDGE MAESTRO DRILL (MISCELLANEOUS) ×3
PACK LAMINECTOMY NEURO (CUSTOM PROCEDURE TRAY) ×3 IMPLANT
PATTIES SURGICAL .25X.25 (GAUZE/BANDAGES/DRESSINGS) IMPLANT
RUBBERBAND STERILE (MISCELLANEOUS) ×2 IMPLANT
SPONGE SURGIFOAM ABS GEL SZ50 (HEMOSTASIS) ×3 IMPLANT
STAPLER SKIN PROX WIDE 3.9 (STAPLE) ×3 IMPLANT
STRIP CLOSURE SKIN 1/2X4 (GAUZE/BANDAGES/DRESSINGS) ×1 IMPLANT
SUT ETHILON 3 0 FSL (SUTURE) IMPLANT
SUT VIC AB 0 CT1 18XCR BRD8 (SUTURE) ×1 IMPLANT
SUT VIC AB 0 CT1 8-18 (SUTURE) ×3
SUT VIC AB 2-0 CT1 18 (SUTURE) ×3 IMPLANT
SUT VIC AB 3-0 SH 8-18 (SUTURE) ×3 IMPLANT
TOWEL GREEN STERILE (TOWEL DISPOSABLE) ×6 IMPLANT
TOWEL GREEN STERILE FF (TOWEL DISPOSABLE) ×3 IMPLANT
UNDERPAD 30X30 (UNDERPADS AND DIAPERS) IMPLANT
WATER STERILE IRR 1000ML POUR (IV SOLUTION) ×3 IMPLANT

## 2016-08-12 NOTE — H&P (Signed)
There were no vitals taken for this visit. [name] comes in today at the behest of Dr. Pricilla Holm. She is having right neck pain and has forearm pain bilaterally along with hand pain. But she will unequivocally state that she has no pain in the arms. She will awake frequently at night shaking her hands. She does feel some numbness at times in her fingers. She is in end-stage renal failure and does have multiple surgical scars in the upper extremities. She says this has only been going on for the last 3 months and she had never experienced this before. There was no antecedent trauma other than the surgeries that she has had, but certainly nothing to her neck. She says it started with a crick in her neck as she thought, says that got maybe a little bit better, but then she started having the pain in the hands and forearms.  Height 5 feet 7 inches, weight 143.6 pounds, temperature is 99.5 degrees Fahrenheit, blood pressure is 159/178, pulse is 76. Pain is 7/10. She is she is right-handed.  She does smoke. She does drink socially. No history of substance abuse. She is 56 years of age. ALLERGIES: To Codeine and Penicillin, both of which cause hives. MEDICATIONS: Have been scanned into the system and are present. PHYSICAL EXAMINATION: General: She is alert, oriented x 4, answering all questions appropriately. Upper Extremities: Multiple well-healed scars on both the right and left upper extremities. She has mildly positive Tinel sign on the right. She had negative Phalen test. She had negative Tinel sign over the cubital tunnel bilaterally. ASSESSMENT AND PLAN: Her history strong implies carpal tunnel syndrome. What I will do is have her undergo an EMG nerve conduction study to ascertain whether or not she does have the carpal tunnel and I will see her in the office afterwards. [name] returns after the EMG, which showed mild carpal tunnel syndrome and the cervical spine MRI, which showed spinal  cord stenosis from C3 to the C6-7 border. At this point, I think she needs a posterior cervical decompression and arthrodesis. Could go from the front, but she still has a lordosis, and therefore, I think just doing this posteriorly is easier on her, and I can do all of the decompression that I need.  ASSESSMENT/PLAN: The risks of the procedure, bleeding, infection, no relief, need for further surgery, fusion failure, and hardware failure were discussed. She understands and wishes to proceed.

## 2016-08-12 NOTE — Anesthesia Postprocedure Evaluation (Signed)
Anesthesia Post Note  Patient: [name]  Procedure(s) Performed: Procedure(s) (LRB): POSTERIOR CERVICAL LAMINECTOMY CERVICAL THREE CERVICAL FOUR, CERVICAL FOUR CERVICAL FIVE, CERVICAL FIVE- CERVICAL SIX, CERVICAL SIX- CERVICAL SEVEN (N/A)     Patient location during evaluation: PACU Anesthesia Type: General Level of consciousness: awake and alert and patient cooperative Pain management: pain level controlled Vital Signs Assessment: post-procedure vital signs reviewed and stable Respiratory status: spontaneous breathing and respiratory function stable Cardiovascular status: stable Anesthetic complications: no    Last Vitals:  Vitals:   08/12/16 1755 08/12/16 1825  BP: (!) 168/83 (!) 146/78  Pulse: 83 86  Resp: 12 12  Temp:      Last Pain:  Vitals:   08/12/16 1825  TempSrc:   PainSc: [place]

## 2016-08-12 NOTE — Transfer of Care (Signed)
Immediate Anesthesia Transfer of Care Note  Patient: [name]  Procedure(s) Performed: Procedure(s) with comments: POSTERIOR CERVICAL LAMINECTOMY CERVICAL THREE CERVICAL FOUR, CERVICAL FOUR CERVICAL FIVE, CERVICAL FIVE- CERVICAL SIX, CERVICAL SIX- CERVICAL SEVEN (N/A) - POSTERIOR  Patient Location: PACU  Anesthesia Type:General  Level of Consciousness: drowsy  Airway & Oxygen Therapy: Patient Spontanous Breathing and Patient connected to nasal cannula oxygen  Post-op Assessment: Report given to RN and Post -op Vital signs reviewed and stable  Post vital signs: Reviewed and stable  Last Vitals:  Vitals:   08/12/16 0838  BP: (!) 174/88  Pulse: 73  Resp: 20  Temp: 36.6 C    Last Pain:  Vitals:   08/12/16 0857  TempSrc:   PainSc: 7       Patients Stated Pain Goal: 6 (25/18/98 4210)  Complications: No apparent anesthesia complications

## 2016-08-12 NOTE — Op Note (Signed)
08/12/2016  2:34 PM  PATIENT:  [name]  56 y.o. female with cervical stenosis and myelopathy. I have recommended that she undergo operative decompression via a cervical laminectomy  PRE-OPERATIVE DIAGNOSIS:  CERVICAL STENOSIS OF SPINAL CANAL C3-6 POST-OPERATIVE DIAGNOSIS:  CERVICAL STENOSIS OF SPINAL CANAL C3-6  PROCEDURE:  Procedure(s): POSTERIOR CERVICAL LAMINECTOMY CERVICAL THREE CERVICAL FOUR ,CERVICAL FIVE, CERVICAL FIVE- CERVICAL SIX, SURGEON: Surgeon(s): Ashok Pall, MD Eustace Moore, MD  ASSISTANTS:JOnes, Shanon Brow  ANESTHESIA:   general  EBL:  Total I/O In: 750 [I.V.:750] Out: 150 [Blood:150]  BLOOD ADMINISTERED:none  CELL SAVER GIVEN:none  COUNT:per nursing  DRAINS: (1) Hemovact drain(s) in the subfascial space with  Suction Open   SPECIMEN:  No Specimen  DICTATION: [name] was taken to the operating room, intubated, and placed under a general anesthetic without difficulty. Once adequate anesthesia was obtained I placed her head in a three pin head holder. I attached the head holder to the OR bed via the adapter. She was positioned prone on body rolls with all pressure points properly padded. Her neck was shaved, was prepped and was,draped in a sterile manner. I infiltrated the planned incision with lidocaine. I opened the incision with a 10 blade and dissected sharply and with monopolar cautery to the nuchal ligament. I exposed the C3,4,5, and 6 lamina bilaterally with monopolar cautery. I confirmed my location with fluoroscopy. I used the drill, Kerrison punches, and dissectors to perform the laminectomies of C3,4,5,and 6 in order to decompress the cervical spinal canal. Dr. Ronnald Ramp assisted in the decompression and closure.  The decompression now complete I achieved good hemostasis, and placed a drain due to blood oozing from the paraspinous musculature. We approximated the nuchal ligament, subcutaneous, and subcuticular tissue with vicryl sutures. I applied dermabond,  and an occlusive bandage for a sterile dressing.  PLAN OF CARE: Admit to inpatient   PATIENT DISPOSITION:  PACU - hemodynamically stable.   Delay start of Pharmacological VTE agent (>24hrs) due to surgical blood loss or risk of bleeding:  yes

## 2016-08-12 NOTE — Anesthesia Preprocedure Evaluation (Addendum)
Anesthesia Evaluation  Patient identified by MRN, date of birth, ID band Patient awake    Reviewed: Allergy & Precautions, H&P , NPO status , Patient's Chart, lab work & pertinent test results  Airway Mallampati: II   Neck ROM: full    Dental  (+) Missing, Dental Advisory Given   Pulmonary Current Smoker,    breath sounds clear to auscultation       Cardiovascular hypertension,  Rhythm:regular Rate:Normal     Neuro/Psych PSYCHIATRIC DISORDERS Anxiety    GI/Hepatic   Endo/Other  Hypothyroidism   Renal/GU ESRF and DialysisRenal disease     Musculoskeletal  (+) Arthritis ,   Abdominal   Peds  Hematology   Anesthesia Other Findings   Reproductive/Obstetrics                            Anesthesia Physical Anesthesia Plan  ASA: III  Anesthesia Plan: General   Post-op Pain Management:    Induction: Intravenous  PONV Risk Score and Plan: 3 and Ondansetron, Dexamethasone, Propofol, Midazolam and Treatment may vary due to age or medical condition  Airway Management Planned: Oral ETT and Video Laryngoscope Planned  Additional Equipment:   Intra-op Plan:   Post-operative Plan: Extubation in OR  Informed Consent: I have reviewed the patients History and Physical, chart, labs and discussed the procedure including the risks, benefits and alternatives for the proposed anesthesia with the patient or authorized representative who has indicated his/her understanding and acceptance.     Plan Discussed with: CRNA, Anesthesiologist and Surgeon  Anesthesia Plan Comments:         Anesthesia Quick Evaluation

## 2016-08-12 NOTE — Progress Notes (Addendum)
Patient arrived from PACU around 2000 alert and oriented, complaining of left hand/ wrist pain and surgical site pain. Given incentive spirometer and educated on use, she is a dialisis patient who is Tues., Thurs., Sat., who will have dialysis tomorrow. Surgical site looks good, she has a hemo vac suction drain in place will continue to monitor. She is anuric so she will not be voiding.

## 2016-08-12 NOTE — Anesthesia Procedure Notes (Signed)
Procedure Name: Intubation Date/Time: 08/12/2016 11:48 AM Performed by: Ollen Bowl Pre-anesthesia Checklist: Patient identified, Emergency Drugs available, Suction available, Patient being monitored and Timeout performed Patient Re-evaluated:Patient Re-evaluated prior to induction Oxygen Delivery Method: Circle system utilized and Simple face mask Preoxygenation: Pre-oxygenation with 100% oxygen Induction Type: IV induction and Rapid sequence Ventilation: Mask ventilation without difficulty Laryngoscope Size: Glidescope and 4 Grade View: Grade I Tube type: Oral Tube size: 7.5 mm Number of attempts: 1 Airway Equipment and Method: Patient positioned with wedge pillow,  Stylet and Video-laryngoscopy Placement Confirmation: ETT inserted through vocal cords under direct vision,  positive ETCO2 and breath sounds checked- equal and bilateral Secured at: 23 cm Tube secured with: Tape Dental Injury: Teeth and Oropharynx as per pre-operative assessment

## 2016-08-13 ENCOUNTER — Encounter (HOSPITAL_COMMUNITY): Payer: Self-pay | Admitting: Neurosurgery

## 2016-08-13 LAB — RENAL FUNCTION PANEL
Albumin: 3.2 g/dL — ABNORMAL LOW (ref 3.5–5.0)
Anion gap: 12 (ref 5–15)
BUN: 32 mg/dL — ABNORMAL HIGH (ref 6–20)
CALCIUM: 9.2 mg/dL (ref 8.9–10.3)
CO2: 20 mmol/L — ABNORMAL LOW (ref 22–32)
CREATININE: 9.39 mg/dL — AB (ref 0.44–1.00)
Chloride: 98 mmol/L — ABNORMAL LOW (ref 101–111)
GFR, EST AFRICAN AMERICAN: 5 mL/min — AB (ref >60)
GFR, EST NON AFRICAN AMERICAN: 4 mL/min — AB (ref >60)
Glucose, Bld: 114 mg/dL — ABNORMAL HIGH (ref 65–99)
PHOSPHORUS: 4 mg/dL (ref 2.5–4.6)
Potassium: 4 mmol/L (ref 3.5–5.1)
SODIUM: 130 mmol/L — AB (ref 135–145)

## 2016-08-13 LAB — CBC
HCT: 29.3 % — ABNORMAL LOW (ref 36.0–46.0)
Hemoglobin: 9.7 g/dL — ABNORMAL LOW (ref 12.0–15.0)
MCH: 29.6 pg (ref 26.0–34.0)
MCHC: 33.1 g/dL (ref 30.0–36.0)
MCV: 89.3 fL (ref 78.0–100.0)
PLATELETS: 197 10*3/uL (ref 150–400)
RBC: 3.28 MIL/uL — AB (ref 3.87–5.11)
RDW: 13.8 % (ref 11.5–15.5)
WBC: 11.5 10*3/uL — AB (ref 4.0–10.5)

## 2016-08-13 MED ORDER — DEXAMETHASONE 4 MG PO TABS
4.0000 mg | ORAL_TABLET | Freq: Four times a day (QID) | ORAL | Status: DC
  Administered 2016-08-13 – 2016-08-14 (×5): 4 mg via ORAL
  Filled 2016-08-13 (×5): qty 1

## 2016-08-13 MED ORDER — TRAMADOL HCL 50 MG PO TABS: 50.0000 mg | ORAL_TABLET | Freq: Four times a day (QID) | ORAL | 0 refills | Status: DC | PRN

## 2016-08-13 MED ORDER — TIZANIDINE HCL 4 MG PO TABS: 4.0000 mg | ORAL_TABLET | Freq: Four times a day (QID) | ORAL | 0 refills | Status: DC | PRN

## 2016-08-13 NOTE — Progress Notes (Signed)
Patient ID: [name], female   DOB: 29-Jan-1960, 56 y.o.   MRN: [id number] BP (!) 121/55 (BP Location: Left Arm)   Pulse 78   Temp 98.1 F (36.7 C) (Oral)   Resp 17   Ht 5\' 7"  (1.702 m)   Wt 64.5 kg (142 lb 3.2 oz)   SpO2 98%   BMI 22.27 kg/m  Alert and oriented x 4, speech is clear and fluent Moving all extremities well Wound is clean flat and dry Discharge tomorrow.

## 2016-08-13 NOTE — Progress Notes (Signed)
Called dialysis gave Sal report on patient.

## 2016-08-13 NOTE — Procedures (Signed)
  I was present at this dialysis session, have reviewed the session itself and made  appropriate changes Kelly Splinter MD [place] pager [phone]   08/13/2016, 11:58 AM

## 2016-08-13 NOTE — Procedures (Signed)
  I was present at this dialysis session, have reviewed the session itself and made  appropriate changes Kelly Splinter MD [place] pager [phone]   08/13/2016, 4:18 PM

## 2016-08-13 NOTE — Discharge Instructions (Signed)
You may shower, the wound may get wet.  Call if you have drainage from the wound Call if your temperature is greater than 101.5 Do not ligt more than 5 lbs Do not drive. You may be a passenger You may eat whatever you like.

## 2016-08-13 NOTE — Evaluation (Signed)
Physical Therapy Evaluation Patient Details Name: [name] MRN: [id number] DOB: [birth year] Today's Date: 08/13/2016   History of Present Illness  56 y.o. female admitted on 08/12/16 for posterior cervical laminectomy C3-6.  Pt with significant PMH of HTN, ESRD (HD T, TH, Sat), and anemia.  Clinical Impression  Pt is doing well with gait and mobility.  She looks a bit more steady with RW use and feels more confident with RW.  She does a good job using it and maintaining upright posture.  I do not believe she needs HH therapy at this time, but if her gait and balance issues persist, she would benefit from OP PT for balance and gait training when the MD deems that she has healed enough to tolerate that level of activity.   PT to follow acutely for deficits listed below.       Follow Up Recommendations Outpatient PT;DC plan and follow up therapy as arranged by surgeon (OP PT for balance and gait training when MD deems ok)    Equipment Recommendations  Rolling walker with 5" wheels    Recommendations for Other Services   NA    Precautions / Restrictions Precautions Precautions: Cervical Precaution Comments: Cervical handout given and reviewed in detail with pt and her family.       Mobility  Bed Mobility Overal bed mobility: Modified Independent                Transfers Overall transfer level: Needs assistance Equipment used: Rolling walker (2 wheeled);None Transfers: Sit to/from Stand Sit to Stand: Supervision         General transfer comment: supervision for safety  Ambulation/Gait Ambulation/Gait assistance: Supervision;Min guard Ambulation Distance (Feet): 200 Feet Assistive device: Rolling walker (2 wheeled);None Gait Pattern/deviations: Step-through pattern;Ataxic;Steppage Gait velocity: decreased Gait velocity interpretation: Below normal speed for age/gender General Gait Details: Pt with mildly ataxic gait pattern, seemed more balanced and confident with RW  use.  Verbal cues for upright posture.    Stairs Stairs: Yes Stairs assistance: Supervision Stair Management: Two rails;Alternating pattern;Forwards Number of Stairs: 10 General stair comments: Pt got a bit lightheaded at the top of the stairs, but we both think it was because she was holding her breath while doing the stairs.  She was able to stand for a minute at the top and regroup and continue.  She did well with light support of hands on rails.           Balance Overall balance assessment: Needs assistance Sitting-balance support: Feet supported;No upper extremity supported Sitting balance-Leahy Scale: Good Sitting balance - Comments: Pt can be seated and cross her legs to get to her feet   Standing balance support: Bilateral upper extremity supported;No upper extremity supported Standing balance-Leahy Scale: Fair Standing balance comment: great in static standing, dynamically needs a little support.                              Pertinent Vitals/Pain Pain Assessment: Faces Faces Pain Scale: Hurts even more Pain Location: incisional, left arm numbness Pain Descriptors / Indicators: Aching;Burning Pain Intervention(s): Limited activity within patient's tolerance;Monitored during session;Repositioned    Home Living Family/patient expects to be discharged to:: Private residence Living Arrangements: Parent Available Help at Discharge: Family;Available 24 hours/day (daughter is staying for a week or two) Type of Home: House Home Access: Stairs to enter Entrance Stairs-Rails: Right;Left;Can reach both Entrance Stairs-Number of Steps: 5 Home Layout: One level  Home Equipment: None      Prior Function Level of Independence: Needs assistance   Gait / Transfers Assistance Needed: Pt was starting to have difficulty walking, but avoided using a RW.  She drives herself to HD and does not work.            Hand Dominance   Dominant Hand: Right     Extremity/Trunk Assessment   Upper Extremity Assessment Upper Extremity Assessment: Defer to OT evaluation    Lower Extremity Assessment Lower Extremity Assessment: RLE deficits/detail;LLE deficits/detail RLE Deficits / Details: per pt report PTA bil leg numbness, now no numbness, but decreased coordination and strength throughout.  RLE Sensation:  (intact to LT) RLE Coordination: decreased gross motor LLE Deficits / Details: per pt report PTA bil leg numbness, now no numbness, but decreased coordination and strength throughout.  LLE Sensation:  (intact to LT) LLE Coordination: decreased gross motor    Cervical / Trunk Assessment Cervical / Trunk Assessment: Other exceptions Cervical / Trunk Exceptions: post cervical surgery  Communication   Communication: No difficulties  Cognition Arousal/Alertness: Awake/alert Behavior During Therapy: WFL for tasks assessed/performed Overall Cognitive Status: Within Functional Limits for tasks assessed                                               Assessment/Plan    PT Assessment Patient needs continued PT services  PT Problem List Decreased strength;Decreased activity tolerance;Decreased balance;Decreased mobility;Decreased coordination;Decreased knowledge of use of DME;Decreased knowledge of precautions;Impaired sensation;Pain       PT Treatment Interventions DME instruction;Gait training;Stair training;Functional mobility training;Therapeutic activities;Therapeutic exercise;Balance training;Neuromuscular re-education;Patient/family education    PT Goals (Current goals can be found in the Care Plan section)  Acute Rehab PT Goals Patient Stated Goal: to get back to walking normally, get the numbness out of her hands PT Goal Formulation: With patient Time For Goal Achievement: 08/20/16 Potential to Achieve Goals: Good    Frequency Min 5X/week           AM-PAC PT "6 Clicks" Daily Activity  Outcome Measure  Difficulty turning over in bed (including adjusting bedclothes, sheets and blankets)?: None Difficulty moving from lying on back to sitting on the side of the bed? : None Difficulty sitting down on and standing up from a chair with arms (e.g., wheelchair, bedside commode, etc,.)?: None Help needed moving to and from a bed to chair (including a wheelchair)?: A Little Help needed walking in hospital room?: A Little Help needed climbing 3-5 steps with a railing? : A Little 6 Click Score: 21    End of Session Equipment Utilized During Treatment: Gait belt Activity Tolerance: Patient limited by pain Patient left: in bed;with call bell/phone within reach;with family/visitor present Nurse Communication: Mobility status PT Visit Diagnosis: Muscle weakness (generalized) (M62.81);Unsteadiness on feet (R26.81);Difficulty in walking, not elsewhere classified (R26.2);Pain Pain - Right/Left:  (posterior) Pain - part of body:  (neck)    Time: 1829-9371 PT Time Calculation (min) (ACUTE ONLY): 42 min   Charges:         Wells Guiles B. Anias Bartol, PT, DPT [phone]   PT Evaluation $PT Eval Moderate Complexity: 1 Procedure PT Treatments $Gait Training: 8-22 mins $Self Care/Home Management: 8-22   08/13/2016, 3:55 PM

## 2016-08-13 NOTE — Progress Notes (Signed)
PT Cancellation Note  Patient Details Name: [name] MRN: [id number] DOB: 09-04-1960   Cancelled Treatment:    Reason Eval/Treat Not Completed: Patient at procedure or test/unavailable.   Pt is at HD.  PT will check back later as time allows.  Thanks,   Barbarann Ehlers. [place], [place], DPT [phone]   08/13/2016, 10:24 AM

## 2016-08-13 NOTE — Consult Note (Signed)
[facility] Renal Consultation Note  Indication for Consultation:  Management of ESRD/hemodialysis; anemia, hypertension/volume and secondary hyperparathyroidism  HPI: [name] is a 56 y.o. female. 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 Harlow Asa , now admitted with   Ho spinal cord stenosis from C3 to the C6-7 border and right neck pain and has forearm pain bilaterally along with hand pain . Work by Neurosurgery  Dr Christella Noa with need for Cervical laminectomy  scheduled . WE are consulted for HD/ ESRD Needs. HD today done with no heparin . Now sp sugery from Yesterday and feels better.      Past Medical History:  Diagnosis Date  . Anemia   . Anxiety   . Arthritis   . Claustrophobia   . ESRD (end stage renal disease) on dialysis ([place])    "TTS; [facility]" (03/24/2015)  . Glomerulonephritis   . Heart murmur   . HTN (hypertension)   . Hypothyroidism   . Pancreatitis   . Renal insufficiency   . Secondary hyperparathyroidism ([place])    [name] 05/11/2014    Past Surgical History:  Procedure Laterality Date  . ARTERIOVENOUS GRAFT PLACEMENT Right 1990's?  . ARTERIOVENOUS GRAFT PLACEMENT Left 07/2002   upper arm/notes 06/02/2010  . ARTERIOVENOUS GRAFT PLACEMENT Right 07/2003   upper arm/notes 06/02/2010  . AV FISTULA PLACEMENT Left 09/2000   upper arm/notes 06/02/2010  . BREAST BIOPSY Left unsure   benign  . COLONOSCOPY    . DILATATION & CURRETTAGE/HYSTEROSCOPY WITH RESECTOCOPE N/A 04/17/2013   Procedure: DILATATION & CURETTAGE/HYSTEROSCOPY WITH RESECTOCOPE;  Surgeon: Marvene Staff, MD;  Location: [facility]  ORS;  Service: Gynecology;  Laterality: N/A;  . DILATION AND CURETTAGE OF UTERUS    . EYE SURGERY    . KIDNEY TRANSPLANT  1997  . PARATHYROIDECTOMY  03/2000 05/12/2014   w/neck exploration & autotransplantation/notes 06/02/2010; w/neck exploration  . PARATHYROIDECTOMY N/A 05/12/2014   Procedure: PARATHYROIDECTOMY AND NECK EXPLORATION;  Surgeon: Armandina Gemma, MD;  Location: [facility];  Service: General;  Laterality: N/A;  . PERITONEAL CATHETER INSERTION    . PERITONEAL CATHETER REMOVAL  08/1999   [name] 06/02/2010  . POSTERIOR CERVICAL LAMINECTOMY N/A 08/12/2016   Procedure: POSTERIOR CERVICAL LAMINECTOMY CERVICAL THREE CERVICAL FOUR, CERVICAL FOUR CERVICAL FIVE, CERVICAL FIVE- CERVICAL SIX, CERVICAL SIX- CERVICAL SEVEN;  Surgeon: Ashok Pall, MD;  Location: [facility];  Service: Neurosurgery;  Laterality: N/A;  POSTERIOR  . RETINAL DETACHMENT SURGERY Left   . THROMBECTOMY Left 02/2002   fistula/notes 06/02/2010  . THROMBECTOMY / ARTERIOVENOUS GRAFT REVISION  11/2003; 01/2004; 08/07/2005; 08/10/2005; 10/2005   [name] 06/02/2010  . THROMBECTOMY AND REVISION OF ARTERIOVENTOUS (AV) GORETEX  GRAFT Right 01/2002; 11/2003; 01/2004; 08/07/2004; 08/10/2004; 11/13/2005   [name] 5/1/2012Marland Kitchen [name] 5/16/2012Marland Kitchen [name] 5/16/2012Marland Kitchen [name] 5/16/2012Marland Kitchen [name] 06/02/2010; [name] 06/02/2010  . THROMBECTOMY AND REVISION OF ARTERIOVENTOUS (AV) GORETEX  GRAFT Left 08/23/2002; 09/13/2002; 07/08/2003   [name] 06/02/2010; [name] 06/02/2010; [name] 06/02/2010      Family History  Problem Relation Age of Onset  . Adopted: Yes  . Diabetes Maternal Aunt        x2  . Diabetes Maternal Uncle  reports that she has been smoking Cigarettes.  She has a 3.60 pack-year smoking history. She has never used smokeless tobacco. She reports that she does not drink alcohol or use drugs.   Allergies  Allergen Reactions  . Penicillins Hives    Has patient had a PCN reaction causing immediate rash, facial/tongue/throat swelling, SOB or lightheadedness with  hypotension: yes Has patient had a PCN reaction causing severe rash involving mucus membranes or skin necrosis: Non Has patient had a PCN reaction that required hospitalization No Has patient had a PCN reaction occurring within the last 10 years: No If all of the above answers are "NO", then may proceed with Cephalosporin  . Codeine Hives  . Ace Inhibitors Hives and Nausea And Vomiting    Prior to Admission medications   Medication Sig Start Date End Date Taking? Authorizing Provider  ALPRAZolam (XANAX) 0.25 MG tablet Take 1 tablet (0.25 mg total) by mouth daily as needed for anxiety. 05/20/16  Yes Hoyt Koch, MD  calcium acetate (PHOSLO) 667 MG capsule Take 667 mg by mouth See admin instructions. 1 capsule by mouth 3 times daily with meals and 1 with snacks 02/22/16  Yes [provider]  tiZANidine (ZANAFLEX) 4 MG tablet Take 1 tablet (4 mg total) by mouth every 6 (six) hours as needed for muscle spasms. 08/13/16   Ashok Pall, MD  traMADol (ULTRAM) 50 MG tablet Take 1 tablet (50 mg total) by mouth every 6 (six) hours as needed. 08/13/16   Ashok Pall, MD    NWG:[id number] **OR** acetaminophen, ALPRAZolam, bisacodyl, cyclobenzaprine, magnesium citrate, menthol-cetylpyridinium **OR** phenol, ondansetron **OR** ondansetron (ZOFRAN) IV, senna-docusate, sodium chloride flush, zolpidem  Results for orders placed or performed during the hospital encounter of 08/12/16 (from the past 48 hour(s))  I-STAT 4, (NA,K, GLUC, HGB,HCT)     Status: Abnormal   Collection Time: 08/12/16  8:55 AM  Result Value Ref Range   Sodium 135 135 - 145 mmol/L   Potassium 4.1 3.5 - 5.1 mmol/L   Glucose, Bld 75 65 - 99 mg/dL   HCT 34.0 (L) 36.0 - 46.0 %   Hemoglobin 11.6 (L) 12.0 - 15.0 g/dL  Renal function panel     Status: Abnormal   Collection Time: 08/13/16  8:08 AM  Result Value Ref Range   Sodium 130 (L) 135 - 145 mmol/L   Potassium 4.0 3.5 - 5.1 mmol/L   Chloride 98 (L) 101 - 111 mmol/L    CO2 20 (L) 22 - 32 mmol/L   Glucose, Bld 114 (H) 65 - 99 mg/dL   BUN 32 (H) 6 - 20 mg/dL   Creatinine, Ser 9.39 (H) 0.44 - 1.00 mg/dL   Calcium 9.2 8.9 - 10.3 mg/dL   Phosphorus 4.0 2.5 - 4.6 mg/dL   Albumin 3.2 (L) 3.5 - 5.0 g/dL   GFR calc non Af Amer 4 (L) >60 mL/min   GFR calc Af Amer 5 (L) >60 mL/min    Comment: (NOTE) The eGFR has been calculated using the CKD EPI equation. This calculation has not been validated in all clinical situations. eGFR's persistently <60 mL/min signify possible Chronic Kidney Disease.    Anion gap 12 5 - 15  CBC     Status: Abnormal   Collection Time: 08/13/16  8:08 AM  Result Value Ref Range   WBC 11.5 (H) 4.0 - 10.5 K/uL   RBC 3.28 (L) 3.87 - 5.11 MIL/uL   Hemoglobin 9.7 (L) 12.0 - 15.0 g/dL   HCT 29.3 (  L) 36.0 - 46.0 %   MCV 89.3 78.0 - 100.0 fL   MCH 29.6 26.0 - 34.0 pg   MCHC 33.1 30.0 - 36.0 g/dL   RDW 13.8 11.5 - 15.5 %   Platelets 197 150 - 400 K/uL     ROS: see hpi   Physical Exam: Vitals:   08/13/16 1100 08/13/16 1113  BP: (!) 102/56 (!) 121/55  Pulse: 89 78  Resp:  17  Temp:  98.1 F (36.7 C)     General: thin  Alert AAF  NAD / OX4/  HEENT: [place] , MMM, N[place]ter[place]  Neck: no jvd Heart: RRR no mur, rub or gal. Lungs: CTA bilat / Non lab breathing  Abdomen: Soft , NT, nD Extremities: no pedal edema Skin: no overt rash Neuro: Alert oX4, justed finished walking with walker Dialysis Access: Pos bruit RUA AVF  Dialysis: Rule on  .TueThuSat, 3 hrs 30 min, 180NRe Optiflux, BFR 450, DFR Autoflow 1.5, EDW 64.5 (kg), Dialysate 2.0 K, 3.5 Ca, 1.0 Mg, 100 Dextrose (T9774), Sodium 137 (mEq/L), Bicarb Setting: 35 (mEq/L), UFR Profile: Profile 2, Sodium Model: None, Access: RUA  AV Graft- Heparin 5,200.   Hec 1 mcg IV/HD    mircera  50 mg q 4ks last on 7/03  Assessment/Plan:  1. CERVICAL STENOSIS OF SPINAL CANAL C3-6 sp POSTERIOR CERVICAL LAMINECTOMY  ( yesterday )  RX per NS 2. ESRD -  HD TTS on schedule ( no hep hd today  ) 3. Hypertension/volume  - vol and bp stable  4. Anemia  -hgb 9.7  ESA  Due next hd as op  5. Metabolic bone disease -  Vit d on HD  And phos binder (phoslo with Food )   Ernest Haber, PA-C [place] [phone] 08/13/2016, 12:25 PM   Pt seen, examined and agree w A/P as above.  Kelly Splinter MD [employer] pager [phone]   08/13/2016, 1:33 PM

## 2016-08-13 NOTE — Progress Notes (Signed)
OT Cancellation Note  Patient Details Name: [name] MRN: [id number] DOB: 1960/05/25   Cancelled Treatment:    Reason Eval/Treat Not Completed: Patient at procedure or test/ unavailable. OT will check back later as schedule allows  Jaci Carrel 08/13/2016, 10:30 AM

## 2016-08-14 NOTE — Progress Notes (Signed)
Pt discharging at this time with daughter taking all personal belongings. IV discontinued, dry dressing applied. Surgical dressing clean dry and intact. Discharge instructions provided with verbal understanding. Pt will follow up with MD per dc summary. Pt will not need any home care or devices. Pt will be borrowing walker from family member. No noted distress.

## 2016-08-14 NOTE — Progress Notes (Signed)
Pt daughter was given prescriptions from MD.

## 2016-08-14 NOTE — Care Management Note (Signed)
Case Management Note  Patient Details  Name: [name] MRN: [id number] Date of Birth: 14-May-1960  Subjective/Objective:   56 y.o. To be discharged home today. Pt tells me she has RW that she has borrowed from family and will be attending Out patient therapy as prescribed by MD.                  Action/Plan: CM will sign off for now but will be available should additional discharge needs arise or disposition change.    Expected Discharge Date:  08/14/16               Expected Discharge Plan:     In-House Referral:     Discharge planning Services  CM Consult  Post Acute Care Choice:   (not needed pt will use RW borrowed from relative) Choice offered to:  Patient  DME Arranged:  [facility] Rile rolling DME Agency:  NA  HH Arranged:  NA HH Agency:  NA  Status of Service:  Completed, signed off  If discussed at [place] of Stay Meetings, dates discussed:    Additional Comments:  Delrae Sawyers, RN 08/14/2016, 11:57 AM

## 2016-08-14 NOTE — Discharge Summary (Signed)
Physician Discharge Summary  Patient ID: [name] MRN: [id number] DOB/AGE: 56-Oct-1962 56 y.o.  Admit date: 08/12/2016 Discharge date: 08/14/2016  Admission Diagnoses: Cervical stenosis    Discharge Diagnoses: Same   Discharged Condition: good  Hospital Course: The patient was admitted on 08/12/2016 and taken to the operating room where the patient underwent posterior cervical laminectomy. The patient tolerated the procedure well and was taken to the recovery room and then to the floor in stable condition. The hospital course was routine. There were no complications. The wound remained clean dry and intact. Pt had appropriate neck soreness. No complaints of arm pain or new N/T/W. The patient remained afebrile with stable vital signs, and tolerated a regular diet. The patient continued to increase activities, and pain was well controlled with oral pain medications.   Consults: nephrology  Significant Diagnostic Studies:  Results for orders placed or performed during the hospital encounter of 08/12/16  Renal function panel  Result Value Ref Range   Sodium 130 (L) 135 - 145 mmol/L   Potassium 4.0 3.5 - 5.1 mmol/L   Chloride 98 (L) 101 - 111 mmol/L   CO2 20 (L) 22 - 32 mmol/L   Glucose, Bld 114 (H) 65 - 99 mg/dL   BUN 32 (H) 6 - 20 mg/dL   Creatinine, Ser 9.39 (H) 0.44 - 1.00 mg/dL   Calcium 9.2 8.9 - 10.3 mg/dL   Phosphorus 4.0 2.5 - 4.6 mg/dL   Albumin 3.2 (L) 3.5 - 5.0 g/dL   GFR calc non Af Amer 4 (L) >60 mL/min   GFR calc Af Amer 5 (L) >60 mL/min   Anion gap 12 5 - 15  CBC  Result Value Ref Range   WBC 11.5 (H) 4.0 - 10.5 K/uL   RBC 3.28 (L) 3.87 - 5.11 MIL/uL   Hemoglobin 9.7 (L) 12.0 - 15.0 g/dL   HCT 29.3 (L) 36.0 - 46.0 %   MCV 89.3 78.0 - 100.0 fL   MCH 29.6 26.0 - 34.0 pg   MCHC 33.1 30.0 - 36.0 g/dL   RDW 13.8 11.5 - 15.5 %   Platelets 197 150 - 400 K/uL  I-STAT 4, (NA,K, GLUC, HGB,HCT)  Result Value Ref Range   Sodium 135 135 - 145 mmol/L   Potassium 4.1 3.5  - 5.1 mmol/L   Glucose, Bld 75 65 - 99 mg/dL   HCT 34.0 (L) 36.0 - 46.0 %   Hemoglobin 11.6 (L) 12.0 - 15.0 g/dL    Dg Cervical Spine 1 View  Result Date: 08/12/2016 CLINICAL DATA:  56 year old female with a history of cervical stenosis EXAM: CERVICAL SPINE 1 VIEW COMPARISON:  MR 07/25/2016 FINDINGS: Single intraoperative fluoroscopic cross-table lateral the cervical spine. Images demonstrate tissue re- tractor posterior to the C4 and C5 vertebral bodies. Superior surgical probe identifies the posterior elements of C3 and the inferior probe identifies the posterior elements of C5. IMPRESSION: Single intraoperative cross-table lateral of the cervical spine, as above. Please refer to the dictated operative report for full details of intraoperative findings and procedure. Electronically Signed   By: Corrie Mckusick D.O.   On: 08/12/2016 13:23   Mr Cervical Spine Wo Contrast  Result Date: 07/25/2016 CLINICAL DATA:  Neck pain with BILATERAL arm pain and numbness for 6 months. End-stage renal disease with secondary hyperparathyroidism. EXAM: MRI CERVICAL SPINE WITHOUT CONTRAST TECHNIQUE: Multiplanar, multisequence MR imaging of the cervical spine was performed. No intravenous contrast was administered. COMPARISON:  Plain films 05/20/2016. FINDINGS: Alignment: Physiologic. Vertebrae: Diffusely  abnormal bone marrow signal, consistent with end-stage renal disease. Modic type 2 changes above and below C5-6 which is severely narrowed. Cord: Multiple levels of cord compression, with abnormal cord signal at C3-4, C4-5, C5-6, and C6-7. Posterior Fossa, vertebral arteries, paraspinal tissues: Negative. Disc levels: C1-C2 level:  Noncompressive pannus. C2-3: Central protrusion. Effacement anterior subarachnoid space. Minimal cord flattening. No foraminal narrowing. C3-4: Central extrusion. Ligamentum flavum hypertrophy. Moderate cord flattening. Abnormal cord signal. Canal diameter 5 mm. No C4 foraminal narrowing. C4-5:  Central disc extrusion. Ligamentum flavum hypertrophy. Moderate cord flattening. Abnormal cord signal. Canal diameter 5 mm. No C5 foraminal narrowing. C5-6: Broad-based disc osteophyte complex. Ligamentum flavum hypertrophy. Moderate cord flattening without abnormal cord signal. Canal diameter 6 mm. LEFT C6 foraminal narrowing. C6-7: Central disc extrusion. Marked ligamentum flavum hypertrophy. Severe cord flattening with abnormal cord signal. Canal diameter 4-5 mm. BILATERAL C7 foraminal narrowing. C7-T1: Unremarkable disc space. Ligamentum flavum hypertrophy. No impingement. IMPRESSION: Extensive cervical spondylosis, with central disc extrusions and posterior element hypertrophy contributing to moderate to severe stenosis at multiple levels. Canal diameter 5-6 mm. Abnormal cord signal extends from C3 through C7. Radicular symptoms could accompany foraminal narrowing at C5-6 and/or C6-7. Diffuse marrow abnormality consistent with end-stage renal disease. Electronically Signed   By: Staci Righter M.D.   On: 07/25/2016 15:18   Dg C-arm 1-60 Min  Result Date: 08/12/2016 CLINICAL DATA:  56 year old female with a history of cervical stenosis EXAM: CERVICAL SPINE 1 VIEW COMPARISON:  MR 07/25/2016 FINDINGS: Single intraoperative fluoroscopic cross-table lateral the cervical spine. Images demonstrate tissue re- tractor posterior to the C4 and C5 vertebral bodies. Superior surgical probe identifies the posterior elements of C3 and the inferior probe identifies the posterior elements of C5. IMPRESSION: Single intraoperative cross-table lateral of the cervical spine, as above. Please refer to the dictated operative report for full details of intraoperative findings and procedure. Electronically Signed   By: Corrie Mckusick D.O.   On: 08/12/2016 13:23   Mm Screening Breast Tomo Bilateral  Result Date: 08/10/2016 CLINICAL DATA:  Screening. EXAM: 2D DIGITAL SCREENING BILATERAL MAMMOGRAM WITH CAD AND ADJUNCT TOMO  COMPARISON:  Previous exam(s). ACR Breast Density Category c: The breast tissue is heterogeneously dense, which may obscure small masses. FINDINGS: There are no findings suspicious for malignancy. Images were processed with CAD. IMPRESSION: No mammographic evidence of malignancy. A result letter of this screening mammogram will be mailed directly to the patient. RECOMMENDATION: Screening mammogram in one year. (Code:SM-B-01Y) BI-RADS CATEGORY  1: Negative. Electronically Signed   By: Trude Mcburney M.D.   On: 08/10/2016 10:28    Antibiotics:  Anti-infectives    Start     Dose/Rate Route Frequency Ordered Stop   08/12/16 2030  ceFAZolin (ANCEF) IVPB 1 g/50 mL premix     1 g 100 mL/hr over 30 Minutes Intravenous Every 8 hours 08/12/16 1947 08/13/16 0520   08/12/16 0800  vancomycin (VANCOCIN) IVPB 1000 mg/200 mL premix     1,000 mg 200 mL/hr over 60 Minutes Intravenous To [facility] 08/11/16 0856 08/12/16 1235      Discharge Exam: Blood pressure 124/72, pulse 79, temperature 98.5 F (36.9 C), temperature source Oral, resp. rate 18, height 5\' 7"  (1.702 m), weight 64.5 kg (142 lb 3.2 oz), SpO2 99 %. Neurologic: Grossly normal Dressing dry  Discharge Medications:   Allergies as of 08/14/2016      Reactions   Penicillins Hives   Has patient had a PCN reaction causing immediate rash, facial/tongue/throat swelling, SOB  or lightheadedness with hypotension: yes Has patient had a PCN reaction causing severe rash involving mucus membranes or skin necrosis: Non Has patient had a PCN reaction that required hospitalization No Has patient had a PCN reaction occurring within the last 10 years: No If all of the above answers are "NO", then may proceed with Cephalosporin   Codeine Hives   Ace Inhibitors Hives, Nausea And Vomiting      Medication List    TAKE these medications   ALPRAZolam 0.25 MG tablet Commonly known as:  XANAX Take 1 tablet (0.25 mg total) by mouth daily as needed for  anxiety.   calcium acetate 667 MG capsule Commonly known as:  PHOSLO Take 667 mg by mouth See admin instructions. 1 capsule by mouth 3 times daily with meals and 1 with snacks   tiZANidine 4 MG tablet Commonly known as:  ZANAFLEX Take 1 tablet (4 mg total) by mouth every 6 (six) hours as needed for muscle spasms.   traMADol 50 MG tablet Commonly known as:  ULTRAM Take 1 tablet (50 mg total) by mouth every 6 (six) hours as needed.       Disposition: Home   Final Dx: Cervical laminectomy for stenosis    Follow-up Information    Ashok Pall, MD Follow up in 3 week(s).   Specialty:  Neurosurgery Why:  please call to make an appointment in three weeks.  Contact information: [address] [place] [address] [phone]            Signed: Eustace Moore 08/14/2016, 8:53 AM

## 2016-08-14 NOTE — Progress Notes (Signed)
Four staples removed from right side of head per Md verbal. Pt denies pain or discomfort. Will continue to monitor.

## 2016-08-14 NOTE — Evaluation (Signed)
Occupational Therapy Evaluation and Discharge Patient Details Name: [name] MRN: [id number] DOB: [birth year] Today's Date: 08/14/2016    History of Present Illness 56 y.o. female admitted on 08/12/16 for posterior cervical laminectomy C3-6.  Pt with significant PMH of HTN, ESRD (HD T, TH, Sat), and anemia.   Clinical Impression   PTA Pt independent in ADL and mobility (still drives). Pt is currently supervision for ADL and mobility with RW. Cervical handout provided and reviewed adls in detail. Pt educated on: set an alarm at night for medication, correct bed positioning for sleeping, correct sequence for bed mobility, compensatory strategies for sink level ADL; safety focus on bathroom, kitchen, and closet; avoiding lifting and never wash directly over incision. All education is complete and patient indicates/demonstrates understanding. OT to sign off at this time, thank you for this referral.     Follow Up Recommendations  No OT follow up;Supervision - Intermittent    Equipment Recommendations  None recommended by OT (Pt has appropriate DME)    Recommendations for Other Services       Precautions / Restrictions Precautions Precautions: Cervical Precaution Comments: Cervical handout given and reviewed in detail with focus on ADL compensatory strategies Restrictions Weight Bearing Restrictions: No      Mobility Bed Mobility Overal bed mobility: Modified Independent             General bed mobility comments: HOB elevated (She does this at home with pillows)  Transfers Overall transfer level: Needs assistance Equipment used: Rolling walker (2 wheeled);None Transfers: Sit to/from Stand Sit to Stand: Supervision         General transfer comment: supervision for safety    Balance Overall balance assessment: Needs assistance Sitting-balance support: Feet supported;No upper extremity supported Sitting balance-Leahy Scale: Good Sitting balance - Comments: Pt can be  seated and cross her legs to get to her feet to don/doff socks   Standing balance support: Bilateral upper extremity supported;No upper extremity supported Standing balance-Leahy Scale: Fair Standing balance comment: great in static standing for sink level ADL                           ADL either performed or assessed with clinical judgement   ADL Overall ADL's : Needs assistance/impaired                                       General ADL Comments: supervision for all ADL - Pt educated in compensatory strategies for oral care (demonstrated understanding during session) talked about safety and precautions with specific emphasis on bathroom, kitchen, and closet.      Vision Patient Visual Report: No change from baseline Vision Assessment?: No apparent visual deficits     Perception     Praxis      Pertinent Vitals/Pain Pain Assessment: Faces Faces Pain Scale: Hurts little more Pain Location: incisional, tingling in left arm (improving) Pain Descriptors / Indicators: Discomfort;Sore;Tingling Pain Intervention(s): Monitored during session;Repositioned;Premedicated before session     Hand Dominance Right   Extremity/Trunk Assessment Upper Extremity Assessment Upper Extremity Assessment: LUE deficits/detail LUE Deficits / Details: getting sensation back compared to yesterday, tingling, but functional during session - no noted grip problems during ADL LUE Sensation: decreased light touch   Lower Extremity Assessment Lower Extremity Assessment: Defer to PT evaluation   Cervical / Trunk Assessment Cervical / Trunk Assessment: Other exceptions  Cervical / Trunk Exceptions: post cervical surgery   Communication Communication Communication: No difficulties   Cognition Arousal/Alertness: Awake/alert Behavior During Therapy: WFL for tasks assessed/performed Overall Cognitive Status: Within Functional Limits for tasks assessed                                      General Comments  Pt will have daughter and mother there to assist    Exercises     Shoulder Instructions      Home Living Family/patient expects to be discharged to:: Private residence Living Arrangements: Parent Available Help at Discharge: Family;Available 24 hours/day (daughter is staying for a week or two) Type of Home: House Home Access: Stairs to enter [employer] of Steps: 5 Entrance Stairs-Rails: Right;Left;Can reach both Home Layout: One level     Bathroom Shower/Tub: Teacher, early years/pre: Handicapped height     Home Equipment: Shower seat;Grab bars - tub/shower          Prior Functioning/Environment Level of Independence: Needs assistance  Gait / Transfers Assistance Needed: Pt was starting to have difficulty walking, but avoided using a RW.  She drives herself to HD and does not work.               OT Problem List: Decreased range of motion;Impaired balance (sitting and/or standing);Decreased knowledge of use of DME or AE;Decreased knowledge of precautions;Impaired sensation;Pain      OT Treatment/Interventions:      OT Goals(Current goals can be found in the care plan section) Acute Rehab OT Goals Patient Stated Goal: to get back to walking normally, get the rest of the numbness out of her hands OT Goal Formulation: With patient Time For Goal Achievement: 08/28/16 Potential to Achieve Goals: Good  OT Frequency:     Barriers to D/C:            Co-evaluation              AM-PAC PT "6 Clicks" Daily Activity     Outcome Measure Help from another person eating meals?: None Help from another person taking care of personal grooming?: None Help from another person toileting, which includes using toliet, bedpan, or urinal?: A Little Help from another person bathing (including washing, rinsing, drying)?: A Little Help from another person to put on and taking off regular upper body clothing?:  None Help from another person to put on and taking off regular lower body clothing?: None 6 Click Score: 22   End of Session Equipment Utilized During Treatment: Rolling walker Nurse Communication: Mobility status  Activity Tolerance: Patient tolerated treatment well Patient left: in bed;with call bell/phone within reach  OT Visit Diagnosis: Unsteadiness on feet (R26.81);Pain Pain - Right/Left: Left Pain - part of body: Shoulder (neck)                Time: 3545-6256 OT Time Calculation (min): 24 min Charges:  OT General Charges $OT Visit: 1 Procedure OT Evaluation $OT Eval Moderate Complexity: 1 Procedure OT Treatments $Self Care/Home Management : 8-22 mins G-Codes:     Hulda Humphrey OTR/L [place] 08/14/2016, 10:57 AM

## 2016-08-14 NOTE — Progress Notes (Signed)
Physical Therapy Treatment Patient Details Name: PEGGYE POON MRN: [id number] DOB: Jul 22, 1960 Today's Date: 08/14/2016    History of Present Illness 56 y.o. female admitted on 08/12/16 for posterior cervical laminectomy C3-6.  Pt with significant PMH of HTN, ESRD (HD T, TH, Sat), and anemia.    PT Comments    Progressing well with mobility, performed stairs with ease today. Feels better with ambulation and pain control. Anticipate will be safe for d/c home.   Follow Up Recommendations  Outpatient PT;DC plan and follow up therapy as arranged by surgeon (OP PT for balance and gait training when MD deems ok)     Equipment Recommendations  Rolling walker with 5" wheels    Recommendations for Other Services       Precautions / Restrictions Precautions Precautions: Cervical Precaution Comments: Cervical handout given and reviewed in detail with pt and her family.     Mobility  Bed Mobility Overal bed mobility: Modified Independent                Transfers Overall transfer level: Needs assistance Equipment used: Rolling walker (2 wheeled);None Transfers: Sit to/from Stand Sit to Stand: Supervision         General transfer comment: supervision for safety  Ambulation/Gait Ambulation/Gait assistance: Supervision Ambulation Distance (Feet): 410 Feet Assistive device: Rolling walker (2 wheeled);None Gait Pattern/deviations: Step-through pattern;Ataxic;Steppage Gait velocity: decreased   General Gait Details: Vcs for increased cadence and decreased reliance on UE support   Stairs     Stair Management: Two rails;Alternating pattern;Forwards Number of Stairs: 12 General stair comments: good technique and sequencing  Wheelchair Mobility    Modified Rankin (Stroke Patients Only)       Balance Overall balance assessment: Needs assistance Sitting-balance support: Feet supported;No upper extremity supported Sitting balance-Leahy Scale: Good Sitting balance -  Comments: Pt can be seated and cross her legs to get to her feet   Standing balance support: Bilateral upper extremity supported;No upper extremity supported Standing balance-Leahy Scale: Fair                              Cognition Arousal/Alertness: Awake/alert Behavior During Therapy: WFL for tasks assessed/performed Overall Cognitive Status: Within Functional Limits for tasks assessed                                        Exercises      General Comments        Pertinent Vitals/Pain Pain Assessment: Faces Faces Pain Scale: Hurts little more Pain Location: operative site Pain Descriptors / Indicators: Aching;Burning Pain Intervention(s): Monitored during session    Home Living                      Prior Function            PT Goals (current goals can now be found in the care plan section) Acute Rehab PT Goals Patient Stated Goal: to get back to walking normally, get the numbness out of her hands PT Goal Formulation: With patient Time For Goal Achievement: 08/20/16 Potential to Achieve Goals: Good Progress towards PT goals: Progressing toward goals    Frequency    Min 5X/week      PT Plan Current plan remains appropriate    Co-evaluation  AM-PAC PT "6 Clicks" Daily Activity  Outcome Measure  Difficulty turning over in bed (including adjusting bedclothes, sheets and blankets)?: None Difficulty moving from lying on back to sitting on the side of the bed? : None Difficulty sitting down on and standing up from a chair with arms (e.g., wheelchair, bedside commode, etc,.)?: None Help needed moving to and from a bed to chair (including a wheelchair)?: A Little Help needed walking in hospital room?: A Little Help needed climbing 3-5 steps with a railing? : A Little 6 Click Score: 21    End of Session Equipment Utilized During Treatment: Gait belt Activity Tolerance: Patient limited by pain Patient left:  in bed;with call bell/phone within reach;with family/visitor present (sitting EOB) Nurse Communication: Mobility status PT Visit Diagnosis: Muscle weakness (generalized) (M62.81);Unsteadiness on feet (R26.81);Difficulty in walking, not elsewhere classified (R26.2);Pain Pain - Right/Left:  (posterior) Pain - part of body:  (neck)     Time: 8811-0315 PT Time Calculation (min) (ACUTE ONLY): 17 min  Charges:  $Gait Training: 8-22 mins                    G Codes:       Alben Deeds, PT DPT  Board Certified Neurologic Specialist [place] 08/14/2016, 1:13 PM

## 2016-08-14 NOTE — Progress Notes (Signed)
Hemovac removed 15ml emptied. Dry dressing applied. Surgical honeycomb dressing changed, dry and intact. Pt denies pain or discomfort. Will continue to monitor.

## 2016-08-16 DIAGNOSIS — N186 End stage renal disease: Secondary | ICD-10-CM | POA: Diagnosis not present

## 2016-08-16 DIAGNOSIS — I871 Compression of vein: Secondary | ICD-10-CM | POA: Diagnosis not present

## 2016-08-16 DIAGNOSIS — T82868A Thrombosis of vascular prosthetic devices, implants and grafts, initial encounter: Secondary | ICD-10-CM | POA: Diagnosis not present

## 2016-08-16 DIAGNOSIS — Z992 Dependence on renal dialysis: Secondary | ICD-10-CM | POA: Diagnosis not present

## 2016-08-16 DIAGNOSIS — N033 Chronic nephritic syndrome with diffuse mesangial proliferative glomerulonephritis: Secondary | ICD-10-CM | POA: Diagnosis not present

## 2016-08-16 LAB — HEPATITIS B SURFACE ANTIGEN: HEP B S AG: NEGATIVE

## 2016-08-17 DIAGNOSIS — Z992 Dependence on renal dialysis: Secondary | ICD-10-CM | POA: Diagnosis not present

## 2016-08-17 DIAGNOSIS — N186 End stage renal disease: Secondary | ICD-10-CM | POA: Diagnosis not present

## 2016-08-17 DIAGNOSIS — T82858A Stenosis of vascular prosthetic devices, implants and grafts, initial encounter: Secondary | ICD-10-CM | POA: Diagnosis not present

## 2016-08-17 DIAGNOSIS — I871 Compression of vein: Secondary | ICD-10-CM | POA: Diagnosis not present

## 2016-08-18 DIAGNOSIS — N186 End stage renal disease: Secondary | ICD-10-CM | POA: Diagnosis not present

## 2016-08-18 DIAGNOSIS — N2581 Secondary hyperparathyroidism of renal origin: Secondary | ICD-10-CM | POA: Diagnosis not present

## 2016-08-18 DIAGNOSIS — D631 Anemia in chronic kidney disease: Secondary | ICD-10-CM | POA: Diagnosis not present

## 2016-08-19 ENCOUNTER — Ambulatory Visit (INDEPENDENT_AMBULATORY_CARE_PROVIDER_SITE_OTHER): Payer: Medicare Other | Admitting: Nurse Practitioner

## 2016-08-19 VITALS — BP 134/76 | HR 75 | Temp 98.5°F | Ht 67.0 in | Wt 145.0 lb

## 2016-08-19 DIAGNOSIS — M4712 Other spondylosis with myelopathy, cervical region: Secondary | ICD-10-CM | POA: Diagnosis not present

## 2016-08-19 DIAGNOSIS — I10 Essential (primary) hypertension: Secondary | ICD-10-CM

## 2016-08-19 DIAGNOSIS — M4802 Spinal stenosis, cervical region: Secondary | ICD-10-CM

## 2016-08-19 DIAGNOSIS — G992 Myelopathy in diseases classified elsewhere: Secondary | ICD-10-CM

## 2016-08-19 NOTE — Progress Notes (Signed)
Subjective:  Patient ID: [name], female    DOB: 1960/09/10  Age: 56 y.o. MRN: [id number]  CC: Hospitalization Follow-up (hospital f/u/has surgery on spine--incision need to be look at and talk about going back to drive/ BP med consult?)  HPI HTN: Not taking BP medications x 1year. BP Readings from Last 3 Encounters:  08/19/16 134/76  08/14/16 128/76  08/08/16 (!) 170/84  ESRD with dialysis  3x/week via [facility] Forward cath on left upper chest. No edema.  Cervical Laminectomy: Post op day 7. Wants to know if she can start driving even though she was instructed by neurologist to wait 1week after discharge. Resolved numbness and tingling of bilateral hands.   Outpatient Medications Prior to Visit  Medication Sig Dispense Refill  . ALPRAZolam (XANAX) 0.25 MG tablet Take 1 tablet (0.25 mg total) by mouth daily as needed for anxiety. 30 tablet 1  . calcium acetate (PHOSLO) 667 MG capsule Take 667 mg by mouth See admin instructions. 1 capsule by mouth 3 times daily with meals and 1 with snacks    . tiZANidine (ZANAFLEX) 4 MG tablet Take 1 tablet (4 mg total) by mouth every 6 (six) hours as needed for muscle spasms. 60 tablet 0  . traMADol (ULTRAM) 50 MG tablet Take 1 tablet (50 mg total) by mouth every 6 (six) hours as needed. 30 tablet 0   No facility-administered medications prior to visit.     ROS See HPI  Objective:  BP 134/76   Pulse 75   Temp 98.5 F (36.9 C)   Ht 5\' 7"  (1.702 m)   Wt 145 lb (65.8 kg)   SpO2 98%   BMI 22.71 kg/m   BP Readings from Last 3 Encounters:  08/19/16 134/76  08/14/16 128/76  08/08/16 (!) 170/84    Wt Readings from Last 3 Encounters:  08/19/16 145 lb (65.8 kg)  08/13/16 142 lb 3.2 oz (64.5 kg)  08/08/16 146 lb 8 oz (66.5 kg)    Physical Exam  Constitutional: No distress.  Cardiovascular: Normal rate and regular rhythm.   Pulmonary/Chest: Effort normal and breath sounds normal. No respiratory distress. She has no wheezes. She has no rales.   Musculoskeletal: She exhibits no edema.  Limited neck ROM. Surgical dressing (dry and intact)  Skin: No erythema.  Psychiatric: She has a normal mood and affect. Her behavior is normal. Thought content normal.  Vitals reviewed.   Lab Results  Component Value Date   WBC 11.5 (H) 08/13/2016   HGB 9.7 (L) 08/13/2016   HCT 29.3 (L) 08/13/2016   PLT 197 08/13/2016   GLUCOSE 114 (H) 08/13/2016   ALT 6 (L) 03/24/2015   AST 16 03/24/2015   NA 130 (L) 08/13/2016   K 4.0 08/13/2016   CL 98 (L) 08/13/2016   CREATININE 9.39 (H) 08/13/2016   BUN 32 (H) 08/13/2016   CO2 20 (L) 08/13/2016   TSH 5.058 (H) 03/24/2015   INR 1.08 05/16/2014    Mm Screening Breast Tomo Bilateral  Result Date: 08/10/2016 CLINICAL DATA:  Screening. EXAM: 2D DIGITAL SCREENING BILATERAL MAMMOGRAM WITH CAD AND ADJUNCT TOMO COMPARISON:  Previous exam(s). ACR Breast Density Category c: The breast tissue is heterogeneously dense, which may obscure small masses. FINDINGS: There are no findings suspicious for malignancy. Images were processed with CAD. IMPRESSION: No mammographic evidence of malignancy. A result letter of this screening mammogram will be mailed directly to the patient. RECOMMENDATION: Screening mammogram in one year. (Code:SM-B-01Y) BI-RADS CATEGORY  1: Negative. Electronically  Signed   By: Trude Mcburney M.D.   On: 08/10/2016 10:28    Assessment & Plan:   Masen was seen today for hospitalization follow-up.  Diagnoses and all orders for this visit:  HTN (hypertension), benign  Stenosis of cervical spine with myelopathy   I am having Ms. Calmes maintain her calcium acetate, ALPRAZolam, traMADol, and tiZANidine.  No orders of the defined types were placed in this encounter.   Follow-up: Return in about 3 months (around 11/19/2016) for with Dr. Sharlet Salina.  Wilfred Lacy, NP

## 2016-08-19 NOTE — Patient Instructions (Addendum)
Continue to hold amlodipine and metoprolol.  Check BP once a day. If BP > 140/80, call office.  Maintain upcoming appt with neurosurgery. Keep driving and lifting restriction as instructed per neurosurgeon.

## 2016-08-20 DIAGNOSIS — N186 End stage renal disease: Secondary | ICD-10-CM | POA: Diagnosis not present

## 2016-08-20 DIAGNOSIS — N2581 Secondary hyperparathyroidism of renal origin: Secondary | ICD-10-CM | POA: Diagnosis not present

## 2016-08-20 DIAGNOSIS — D631 Anemia in chronic kidney disease: Secondary | ICD-10-CM | POA: Diagnosis not present

## 2016-08-23 DIAGNOSIS — D631 Anemia in chronic kidney disease: Secondary | ICD-10-CM | POA: Diagnosis not present

## 2016-08-23 DIAGNOSIS — N2581 Secondary hyperparathyroidism of renal origin: Secondary | ICD-10-CM | POA: Diagnosis not present

## 2016-08-23 DIAGNOSIS — N186 End stage renal disease: Secondary | ICD-10-CM | POA: Diagnosis not present

## 2016-08-24 ENCOUNTER — Encounter: Payer: Self-pay | Admitting: Nurse Practitioner

## 2016-08-25 DIAGNOSIS — D631 Anemia in chronic kidney disease: Secondary | ICD-10-CM | POA: Diagnosis not present

## 2016-08-25 DIAGNOSIS — N2581 Secondary hyperparathyroidism of renal origin: Secondary | ICD-10-CM | POA: Diagnosis not present

## 2016-08-25 DIAGNOSIS — N186 End stage renal disease: Secondary | ICD-10-CM | POA: Diagnosis not present

## 2016-08-27 DIAGNOSIS — N186 End stage renal disease: Secondary | ICD-10-CM | POA: Diagnosis not present

## 2016-08-27 DIAGNOSIS — N2581 Secondary hyperparathyroidism of renal origin: Secondary | ICD-10-CM | POA: Diagnosis not present

## 2016-08-27 DIAGNOSIS — D631 Anemia in chronic kidney disease: Secondary | ICD-10-CM | POA: Diagnosis not present

## 2016-08-30 DIAGNOSIS — D631 Anemia in chronic kidney disease: Secondary | ICD-10-CM | POA: Diagnosis not present

## 2016-08-30 DIAGNOSIS — N186 End stage renal disease: Secondary | ICD-10-CM | POA: Diagnosis not present

## 2016-08-30 DIAGNOSIS — N2581 Secondary hyperparathyroidism of renal origin: Secondary | ICD-10-CM | POA: Diagnosis not present

## 2016-08-31 DIAGNOSIS — N2581 Secondary hyperparathyroidism of renal origin: Secondary | ICD-10-CM | POA: Diagnosis not present

## 2016-08-31 DIAGNOSIS — Z885 Allergy status to narcotic agent status: Secondary | ICD-10-CM | POA: Diagnosis not present

## 2016-08-31 DIAGNOSIS — T82898A Other specified complication of vascular prosthetic devices, implants and grafts, initial encounter: Secondary | ICD-10-CM | POA: Diagnosis not present

## 2016-08-31 DIAGNOSIS — F649 Gender identity disorder, unspecified: Secondary | ICD-10-CM | POA: Diagnosis not present

## 2016-08-31 DIAGNOSIS — Z888 Allergy status to other drugs, medicaments and biological substances status: Secondary | ICD-10-CM | POA: Diagnosis not present

## 2016-08-31 DIAGNOSIS — I120 Hypertensive chronic kidney disease with stage 5 chronic kidney disease or end stage renal disease: Secondary | ICD-10-CM | POA: Diagnosis not present

## 2016-08-31 DIAGNOSIS — F329 Major depressive disorder, single episode, unspecified: Secondary | ICD-10-CM | POA: Diagnosis not present

## 2016-08-31 DIAGNOSIS — E7800 Pure hypercholesterolemia, unspecified: Secondary | ICD-10-CM | POA: Diagnosis not present

## 2016-08-31 DIAGNOSIS — N186 End stage renal disease: Secondary | ICD-10-CM | POA: Diagnosis not present

## 2016-08-31 DIAGNOSIS — Z880 Allergy status to penicillin: Secondary | ICD-10-CM | POA: Diagnosis not present

## 2016-08-31 DIAGNOSIS — Z992 Dependence on renal dialysis: Secondary | ICD-10-CM | POA: Diagnosis not present

## 2016-09-02 DIAGNOSIS — D631 Anemia in chronic kidney disease: Secondary | ICD-10-CM | POA: Diagnosis not present

## 2016-09-02 DIAGNOSIS — N186 End stage renal disease: Secondary | ICD-10-CM | POA: Diagnosis not present

## 2016-09-02 DIAGNOSIS — N2581 Secondary hyperparathyroidism of renal origin: Secondary | ICD-10-CM | POA: Diagnosis not present

## 2016-09-06 DIAGNOSIS — D631 Anemia in chronic kidney disease: Secondary | ICD-10-CM | POA: Diagnosis not present

## 2016-09-06 DIAGNOSIS — N186 End stage renal disease: Secondary | ICD-10-CM | POA: Diagnosis not present

## 2016-09-06 DIAGNOSIS — N2581 Secondary hyperparathyroidism of renal origin: Secondary | ICD-10-CM | POA: Diagnosis not present

## 2016-09-07 DIAGNOSIS — Z880 Allergy status to penicillin: Secondary | ICD-10-CM | POA: Diagnosis not present

## 2016-09-07 DIAGNOSIS — N186 End stage renal disease: Secondary | ICD-10-CM | POA: Diagnosis not present

## 2016-09-07 DIAGNOSIS — Z885 Allergy status to narcotic agent status: Secondary | ICD-10-CM | POA: Diagnosis not present

## 2016-09-07 DIAGNOSIS — F419 Anxiety disorder, unspecified: Secondary | ICD-10-CM | POA: Diagnosis not present

## 2016-09-07 DIAGNOSIS — Z888 Allergy status to other drugs, medicaments and biological substances status: Secondary | ICD-10-CM | POA: Diagnosis not present

## 2016-09-07 DIAGNOSIS — T82868A Thrombosis of vascular prosthetic devices, implants and grafts, initial encounter: Secondary | ICD-10-CM | POA: Diagnosis not present

## 2016-09-07 DIAGNOSIS — E785 Hyperlipidemia, unspecified: Secondary | ICD-10-CM | POA: Diagnosis not present

## 2016-09-07 DIAGNOSIS — I120 Hypertensive chronic kidney disease with stage 5 chronic kidney disease or end stage renal disease: Secondary | ICD-10-CM | POA: Diagnosis not present

## 2016-09-07 DIAGNOSIS — T82898A Other specified complication of vascular prosthetic devices, implants and grafts, initial encounter: Secondary | ICD-10-CM | POA: Diagnosis not present

## 2016-09-07 DIAGNOSIS — Z992 Dependence on renal dialysis: Secondary | ICD-10-CM | POA: Diagnosis not present

## 2016-09-08 DIAGNOSIS — T82868A Thrombosis of vascular prosthetic devices, implants and grafts, initial encounter: Secondary | ICD-10-CM | POA: Diagnosis not present

## 2016-09-08 DIAGNOSIS — I120 Hypertensive chronic kidney disease with stage 5 chronic kidney disease or end stage renal disease: Secondary | ICD-10-CM | POA: Diagnosis not present

## 2016-09-08 DIAGNOSIS — E7800 Pure hypercholesterolemia, unspecified: Secondary | ICD-10-CM | POA: Diagnosis not present

## 2016-09-08 DIAGNOSIS — E785 Hyperlipidemia, unspecified: Secondary | ICD-10-CM | POA: Diagnosis not present

## 2016-09-08 DIAGNOSIS — F419 Anxiety disorder, unspecified: Secondary | ICD-10-CM | POA: Diagnosis not present

## 2016-09-08 DIAGNOSIS — Z992 Dependence on renal dialysis: Secondary | ICD-10-CM | POA: Diagnosis not present

## 2016-09-08 DIAGNOSIS — N186 End stage renal disease: Secondary | ICD-10-CM | POA: Diagnosis not present

## 2016-09-10 DIAGNOSIS — N186 End stage renal disease: Secondary | ICD-10-CM | POA: Diagnosis not present

## 2016-09-10 DIAGNOSIS — N2581 Secondary hyperparathyroidism of renal origin: Secondary | ICD-10-CM | POA: Diagnosis not present

## 2016-09-10 DIAGNOSIS — D631 Anemia in chronic kidney disease: Secondary | ICD-10-CM | POA: Diagnosis not present

## 2016-09-13 DIAGNOSIS — N186 End stage renal disease: Secondary | ICD-10-CM | POA: Diagnosis not present

## 2016-09-13 DIAGNOSIS — N2581 Secondary hyperparathyroidism of renal origin: Secondary | ICD-10-CM | POA: Diagnosis not present

## 2016-09-13 DIAGNOSIS — D631 Anemia in chronic kidney disease: Secondary | ICD-10-CM | POA: Diagnosis not present

## 2016-09-15 DIAGNOSIS — D631 Anemia in chronic kidney disease: Secondary | ICD-10-CM | POA: Diagnosis not present

## 2016-09-15 DIAGNOSIS — N2581 Secondary hyperparathyroidism of renal origin: Secondary | ICD-10-CM | POA: Diagnosis not present

## 2016-09-15 DIAGNOSIS — N186 End stage renal disease: Secondary | ICD-10-CM | POA: Diagnosis not present

## 2016-09-16 DIAGNOSIS — Z992 Dependence on renal dialysis: Secondary | ICD-10-CM | POA: Diagnosis not present

## 2016-09-16 DIAGNOSIS — N186 End stage renal disease: Secondary | ICD-10-CM | POA: Diagnosis not present

## 2016-09-16 DIAGNOSIS — N033 Chronic nephritic syndrome with diffuse mesangial proliferative glomerulonephritis: Secondary | ICD-10-CM | POA: Diagnosis not present

## 2016-09-17 DIAGNOSIS — Z23 Encounter for immunization: Secondary | ICD-10-CM | POA: Diagnosis not present

## 2016-09-17 DIAGNOSIS — D631 Anemia in chronic kidney disease: Secondary | ICD-10-CM | POA: Diagnosis not present

## 2016-09-17 DIAGNOSIS — N186 End stage renal disease: Secondary | ICD-10-CM | POA: Diagnosis not present

## 2016-09-17 DIAGNOSIS — N2581 Secondary hyperparathyroidism of renal origin: Secondary | ICD-10-CM | POA: Diagnosis not present

## 2016-09-20 DIAGNOSIS — Z23 Encounter for immunization: Secondary | ICD-10-CM | POA: Diagnosis not present

## 2016-09-20 DIAGNOSIS — N186 End stage renal disease: Secondary | ICD-10-CM | POA: Diagnosis not present

## 2016-09-20 DIAGNOSIS — N2581 Secondary hyperparathyroidism of renal origin: Secondary | ICD-10-CM | POA: Diagnosis not present

## 2016-09-20 DIAGNOSIS — D631 Anemia in chronic kidney disease: Secondary | ICD-10-CM | POA: Diagnosis not present

## 2016-09-22 DIAGNOSIS — N186 End stage renal disease: Secondary | ICD-10-CM | POA: Diagnosis not present

## 2016-09-22 DIAGNOSIS — D631 Anemia in chronic kidney disease: Secondary | ICD-10-CM | POA: Diagnosis not present

## 2016-09-22 DIAGNOSIS — Z23 Encounter for immunization: Secondary | ICD-10-CM | POA: Diagnosis not present

## 2016-09-22 DIAGNOSIS — N2581 Secondary hyperparathyroidism of renal origin: Secondary | ICD-10-CM | POA: Diagnosis not present

## 2016-09-24 DIAGNOSIS — D631 Anemia in chronic kidney disease: Secondary | ICD-10-CM | POA: Diagnosis not present

## 2016-09-24 DIAGNOSIS — N2581 Secondary hyperparathyroidism of renal origin: Secondary | ICD-10-CM | POA: Diagnosis not present

## 2016-09-24 DIAGNOSIS — N186 End stage renal disease: Secondary | ICD-10-CM | POA: Diagnosis not present

## 2016-09-24 DIAGNOSIS — Z23 Encounter for immunization: Secondary | ICD-10-CM | POA: Diagnosis not present

## 2016-09-27 DIAGNOSIS — N2581 Secondary hyperparathyroidism of renal origin: Secondary | ICD-10-CM | POA: Diagnosis not present

## 2016-09-27 DIAGNOSIS — Z23 Encounter for immunization: Secondary | ICD-10-CM | POA: Diagnosis not present

## 2016-09-27 DIAGNOSIS — N186 End stage renal disease: Secondary | ICD-10-CM | POA: Diagnosis not present

## 2016-09-27 DIAGNOSIS — D631 Anemia in chronic kidney disease: Secondary | ICD-10-CM | POA: Diagnosis not present

## 2016-09-29 DIAGNOSIS — Z23 Encounter for immunization: Secondary | ICD-10-CM | POA: Diagnosis not present

## 2016-09-29 DIAGNOSIS — D631 Anemia in chronic kidney disease: Secondary | ICD-10-CM | POA: Diagnosis not present

## 2016-09-29 DIAGNOSIS — N2581 Secondary hyperparathyroidism of renal origin: Secondary | ICD-10-CM | POA: Diagnosis not present

## 2016-09-29 DIAGNOSIS — N186 End stage renal disease: Secondary | ICD-10-CM | POA: Diagnosis not present

## 2016-10-01 DIAGNOSIS — Z23 Encounter for immunization: Secondary | ICD-10-CM | POA: Diagnosis not present

## 2016-10-01 DIAGNOSIS — D631 Anemia in chronic kidney disease: Secondary | ICD-10-CM | POA: Diagnosis not present

## 2016-10-01 DIAGNOSIS — N186 End stage renal disease: Secondary | ICD-10-CM | POA: Diagnosis not present

## 2016-10-01 DIAGNOSIS — N2581 Secondary hyperparathyroidism of renal origin: Secondary | ICD-10-CM | POA: Diagnosis not present

## 2016-10-04 DIAGNOSIS — D631 Anemia in chronic kidney disease: Secondary | ICD-10-CM | POA: Diagnosis not present

## 2016-10-04 DIAGNOSIS — N186 End stage renal disease: Secondary | ICD-10-CM | POA: Diagnosis not present

## 2016-10-04 DIAGNOSIS — N2581 Secondary hyperparathyroidism of renal origin: Secondary | ICD-10-CM | POA: Diagnosis not present

## 2016-10-04 DIAGNOSIS — Z23 Encounter for immunization: Secondary | ICD-10-CM | POA: Diagnosis not present

## 2016-10-06 DIAGNOSIS — D631 Anemia in chronic kidney disease: Secondary | ICD-10-CM | POA: Diagnosis not present

## 2016-10-06 DIAGNOSIS — N186 End stage renal disease: Secondary | ICD-10-CM | POA: Diagnosis not present

## 2016-10-06 DIAGNOSIS — Z23 Encounter for immunization: Secondary | ICD-10-CM | POA: Diagnosis not present

## 2016-10-06 DIAGNOSIS — N2581 Secondary hyperparathyroidism of renal origin: Secondary | ICD-10-CM | POA: Diagnosis not present

## 2016-10-07 DIAGNOSIS — Z452 Encounter for adjustment and management of vascular access device: Secondary | ICD-10-CM | POA: Diagnosis not present

## 2016-10-08 DIAGNOSIS — N2581 Secondary hyperparathyroidism of renal origin: Secondary | ICD-10-CM | POA: Diagnosis not present

## 2016-10-08 DIAGNOSIS — Z23 Encounter for immunization: Secondary | ICD-10-CM | POA: Diagnosis not present

## 2016-10-08 DIAGNOSIS — N186 End stage renal disease: Secondary | ICD-10-CM | POA: Diagnosis not present

## 2016-10-08 DIAGNOSIS — D631 Anemia in chronic kidney disease: Secondary | ICD-10-CM | POA: Diagnosis not present

## 2016-10-11 DIAGNOSIS — Z23 Encounter for immunization: Secondary | ICD-10-CM | POA: Diagnosis not present

## 2016-10-11 DIAGNOSIS — N2581 Secondary hyperparathyroidism of renal origin: Secondary | ICD-10-CM | POA: Diagnosis not present

## 2016-10-11 DIAGNOSIS — D631 Anemia in chronic kidney disease: Secondary | ICD-10-CM | POA: Diagnosis not present

## 2016-10-11 DIAGNOSIS — N186 End stage renal disease: Secondary | ICD-10-CM | POA: Diagnosis not present

## 2016-10-13 DIAGNOSIS — Z23 Encounter for immunization: Secondary | ICD-10-CM | POA: Diagnosis not present

## 2016-10-13 DIAGNOSIS — N186 End stage renal disease: Secondary | ICD-10-CM | POA: Diagnosis not present

## 2016-10-13 DIAGNOSIS — D631 Anemia in chronic kidney disease: Secondary | ICD-10-CM | POA: Diagnosis not present

## 2016-10-13 DIAGNOSIS — N2581 Secondary hyperparathyroidism of renal origin: Secondary | ICD-10-CM | POA: Diagnosis not present

## 2016-10-15 DIAGNOSIS — N2581 Secondary hyperparathyroidism of renal origin: Secondary | ICD-10-CM | POA: Diagnosis not present

## 2016-10-15 DIAGNOSIS — Z23 Encounter for immunization: Secondary | ICD-10-CM | POA: Diagnosis not present

## 2016-10-15 DIAGNOSIS — N186 End stage renal disease: Secondary | ICD-10-CM | POA: Diagnosis not present

## 2016-10-15 DIAGNOSIS — D631 Anemia in chronic kidney disease: Secondary | ICD-10-CM | POA: Diagnosis not present

## 2016-10-16 DIAGNOSIS — N033 Chronic nephritic syndrome with diffuse mesangial proliferative glomerulonephritis: Secondary | ICD-10-CM | POA: Diagnosis not present

## 2016-10-16 DIAGNOSIS — Z992 Dependence on renal dialysis: Secondary | ICD-10-CM | POA: Diagnosis not present

## 2016-10-16 DIAGNOSIS — N186 End stage renal disease: Secondary | ICD-10-CM | POA: Diagnosis not present

## 2016-10-18 DIAGNOSIS — N2581 Secondary hyperparathyroidism of renal origin: Secondary | ICD-10-CM | POA: Diagnosis not present

## 2016-10-18 DIAGNOSIS — D509 Iron deficiency anemia, unspecified: Secondary | ICD-10-CM | POA: Diagnosis not present

## 2016-10-18 DIAGNOSIS — D631 Anemia in chronic kidney disease: Secondary | ICD-10-CM | POA: Diagnosis not present

## 2016-10-18 DIAGNOSIS — N186 End stage renal disease: Secondary | ICD-10-CM | POA: Diagnosis not present

## 2016-10-20 DIAGNOSIS — N186 End stage renal disease: Secondary | ICD-10-CM | POA: Diagnosis not present

## 2016-10-20 DIAGNOSIS — D631 Anemia in chronic kidney disease: Secondary | ICD-10-CM | POA: Diagnosis not present

## 2016-10-20 DIAGNOSIS — N2581 Secondary hyperparathyroidism of renal origin: Secondary | ICD-10-CM | POA: Diagnosis not present

## 2016-10-20 DIAGNOSIS — D509 Iron deficiency anemia, unspecified: Secondary | ICD-10-CM | POA: Diagnosis not present

## 2016-10-22 DIAGNOSIS — D631 Anemia in chronic kidney disease: Secondary | ICD-10-CM | POA: Diagnosis not present

## 2016-10-22 DIAGNOSIS — D509 Iron deficiency anemia, unspecified: Secondary | ICD-10-CM | POA: Diagnosis not present

## 2016-10-22 DIAGNOSIS — N186 End stage renal disease: Secondary | ICD-10-CM | POA: Diagnosis not present

## 2016-10-22 DIAGNOSIS — N2581 Secondary hyperparathyroidism of renal origin: Secondary | ICD-10-CM | POA: Diagnosis not present

## 2016-10-25 DIAGNOSIS — N186 End stage renal disease: Secondary | ICD-10-CM | POA: Diagnosis not present

## 2016-10-25 DIAGNOSIS — D631 Anemia in chronic kidney disease: Secondary | ICD-10-CM | POA: Diagnosis not present

## 2016-10-25 DIAGNOSIS — D509 Iron deficiency anemia, unspecified: Secondary | ICD-10-CM | POA: Diagnosis not present

## 2016-10-25 DIAGNOSIS — N2581 Secondary hyperparathyroidism of renal origin: Secondary | ICD-10-CM | POA: Diagnosis not present

## 2016-10-27 DIAGNOSIS — N186 End stage renal disease: Secondary | ICD-10-CM | POA: Diagnosis not present

## 2016-10-27 DIAGNOSIS — N2581 Secondary hyperparathyroidism of renal origin: Secondary | ICD-10-CM | POA: Diagnosis not present

## 2016-10-27 DIAGNOSIS — D631 Anemia in chronic kidney disease: Secondary | ICD-10-CM | POA: Diagnosis not present

## 2016-10-27 DIAGNOSIS — D509 Iron deficiency anemia, unspecified: Secondary | ICD-10-CM | POA: Diagnosis not present

## 2016-10-29 DIAGNOSIS — D631 Anemia in chronic kidney disease: Secondary | ICD-10-CM | POA: Diagnosis not present

## 2016-10-29 DIAGNOSIS — N2581 Secondary hyperparathyroidism of renal origin: Secondary | ICD-10-CM | POA: Diagnosis not present

## 2016-10-29 DIAGNOSIS — D509 Iron deficiency anemia, unspecified: Secondary | ICD-10-CM | POA: Diagnosis not present

## 2016-10-29 DIAGNOSIS — N186 End stage renal disease: Secondary | ICD-10-CM | POA: Diagnosis not present

## 2016-11-01 DIAGNOSIS — N186 End stage renal disease: Secondary | ICD-10-CM | POA: Diagnosis not present

## 2016-11-01 DIAGNOSIS — N2581 Secondary hyperparathyroidism of renal origin: Secondary | ICD-10-CM | POA: Diagnosis not present

## 2016-11-01 DIAGNOSIS — D509 Iron deficiency anemia, unspecified: Secondary | ICD-10-CM | POA: Diagnosis not present

## 2016-11-01 DIAGNOSIS — D631 Anemia in chronic kidney disease: Secondary | ICD-10-CM | POA: Diagnosis not present

## 2016-11-03 DIAGNOSIS — D509 Iron deficiency anemia, unspecified: Secondary | ICD-10-CM | POA: Diagnosis not present

## 2016-11-03 DIAGNOSIS — N186 End stage renal disease: Secondary | ICD-10-CM | POA: Diagnosis not present

## 2016-11-03 DIAGNOSIS — D631 Anemia in chronic kidney disease: Secondary | ICD-10-CM | POA: Diagnosis not present

## 2016-11-03 DIAGNOSIS — N2581 Secondary hyperparathyroidism of renal origin: Secondary | ICD-10-CM | POA: Diagnosis not present

## 2016-11-05 DIAGNOSIS — N2581 Secondary hyperparathyroidism of renal origin: Secondary | ICD-10-CM | POA: Diagnosis not present

## 2016-11-05 DIAGNOSIS — N186 End stage renal disease: Secondary | ICD-10-CM | POA: Diagnosis not present

## 2016-11-05 DIAGNOSIS — D631 Anemia in chronic kidney disease: Secondary | ICD-10-CM | POA: Diagnosis not present

## 2016-11-05 DIAGNOSIS — D509 Iron deficiency anemia, unspecified: Secondary | ICD-10-CM | POA: Diagnosis not present

## 2016-11-08 DIAGNOSIS — D509 Iron deficiency anemia, unspecified: Secondary | ICD-10-CM | POA: Diagnosis not present

## 2016-11-08 DIAGNOSIS — N186 End stage renal disease: Secondary | ICD-10-CM | POA: Diagnosis not present

## 2016-11-08 DIAGNOSIS — N2581 Secondary hyperparathyroidism of renal origin: Secondary | ICD-10-CM | POA: Diagnosis not present

## 2016-11-08 DIAGNOSIS — D631 Anemia in chronic kidney disease: Secondary | ICD-10-CM | POA: Diagnosis not present

## 2016-11-10 DIAGNOSIS — N2581 Secondary hyperparathyroidism of renal origin: Secondary | ICD-10-CM | POA: Diagnosis not present

## 2016-11-10 DIAGNOSIS — D631 Anemia in chronic kidney disease: Secondary | ICD-10-CM | POA: Diagnosis not present

## 2016-11-10 DIAGNOSIS — D509 Iron deficiency anemia, unspecified: Secondary | ICD-10-CM | POA: Diagnosis not present

## 2016-11-10 DIAGNOSIS — N186 End stage renal disease: Secondary | ICD-10-CM | POA: Diagnosis not present

## 2016-11-12 DIAGNOSIS — N186 End stage renal disease: Secondary | ICD-10-CM | POA: Diagnosis not present

## 2016-11-12 DIAGNOSIS — N2581 Secondary hyperparathyroidism of renal origin: Secondary | ICD-10-CM | POA: Diagnosis not present

## 2016-11-12 DIAGNOSIS — D509 Iron deficiency anemia, unspecified: Secondary | ICD-10-CM | POA: Diagnosis not present

## 2016-11-12 DIAGNOSIS — D631 Anemia in chronic kidney disease: Secondary | ICD-10-CM | POA: Diagnosis not present

## 2016-11-15 DIAGNOSIS — N2581 Secondary hyperparathyroidism of renal origin: Secondary | ICD-10-CM | POA: Diagnosis not present

## 2016-11-15 DIAGNOSIS — D631 Anemia in chronic kidney disease: Secondary | ICD-10-CM | POA: Diagnosis not present

## 2016-11-15 DIAGNOSIS — D509 Iron deficiency anemia, unspecified: Secondary | ICD-10-CM | POA: Diagnosis not present

## 2016-11-15 DIAGNOSIS — N186 End stage renal disease: Secondary | ICD-10-CM | POA: Diagnosis not present

## 2016-11-16 DIAGNOSIS — N033 Chronic nephritic syndrome with diffuse mesangial proliferative glomerulonephritis: Secondary | ICD-10-CM | POA: Diagnosis not present

## 2016-11-16 DIAGNOSIS — N186 End stage renal disease: Secondary | ICD-10-CM | POA: Diagnosis not present

## 2016-11-16 DIAGNOSIS — Z992 Dependence on renal dialysis: Secondary | ICD-10-CM | POA: Diagnosis not present

## 2016-11-17 DIAGNOSIS — N2581 Secondary hyperparathyroidism of renal origin: Secondary | ICD-10-CM | POA: Diagnosis not present

## 2016-11-17 DIAGNOSIS — N186 End stage renal disease: Secondary | ICD-10-CM | POA: Diagnosis not present

## 2016-11-19 DIAGNOSIS — N2581 Secondary hyperparathyroidism of renal origin: Secondary | ICD-10-CM | POA: Diagnosis not present

## 2016-11-19 DIAGNOSIS — N186 End stage renal disease: Secondary | ICD-10-CM | POA: Diagnosis not present

## 2016-11-21 ENCOUNTER — Encounter: Payer: Self-pay | Admitting: Internal Medicine

## 2016-11-21 ENCOUNTER — Ambulatory Visit (INDEPENDENT_AMBULATORY_CARE_PROVIDER_SITE_OTHER): Payer: Medicare Other | Admitting: Internal Medicine

## 2016-11-21 DIAGNOSIS — F419 Anxiety disorder, unspecified: Secondary | ICD-10-CM

## 2016-11-21 MED ORDER — ALPRAZOLAM 0.25 MG PO TABS
0.2500 mg | ORAL_TABLET | Freq: Every day | ORAL | 1 refills | Status: DC | PRN
Start: 2016-11-21 — End: 2017-05-25

## 2016-11-21 NOTE — Progress Notes (Signed)
   Subjective:    Patient ID: [name], female    DOB: Sep 19, 1960, 56 y.o.   MRN: [id number]  HPI The patient is a 56 YO female coming in for follow up of her anxiety. She is having some increased anxiety lately due to several false misses with a new kidney. One time she was not a match and another time the kidney was not viable. She denies daily depression or anxiety symptoms. She has used her xanax sparingly and not more than 10-15 per month. She denies panic attacks.   Review of Systems  Constitutional: Negative.   HENT: Negative.   Eyes: Negative.   Respiratory: Negative for cough, chest tightness and shortness of breath.   Cardiovascular: Negative for chest pain, palpitations and leg swelling.  Gastrointestinal: Negative for abdominal distention, abdominal pain, constipation, diarrhea, nausea and vomiting.  Musculoskeletal: Negative.   Skin: Negative.   Neurological: Negative.   Psychiatric/Behavioral: Negative.       Objective:   Physical Exam  Constitutional: She is oriented to person, place, and time. She appears well-developed and well-nourished.  HENT:  Head: Normocephalic and atraumatic.  Oropharynx with redness and clear drainage, nose with swollen turbinates, TMs normal bilaterally  Eyes: EOM are normal.  Neck: Normal range of motion. No thyromegaly present.  Cardiovascular: Normal rate and regular rhythm.  Pulmonary/Chest: Effort normal and breath sounds normal. No respiratory distress. She has no wheezes. She has no rales.  Abdominal: Soft. Bowel sounds are normal. She exhibits no distension. There is no tenderness. There is no rebound.  Musculoskeletal: She exhibits no edema or tenderness.  Lymphadenopathy:    She has no cervical adenopathy.  Neurological: She is alert and oriented to person, place, and time. Coordination normal.  Skin: Skin is warm and dry.  Psychiatric: She has a normal mood and affect.   Vitals:   11/21/16 0834  BP: 100/60  Pulse: 83    Temp: 97.9 F (36.6 C)  TempSrc: Oral  SpO2: 99%  Weight: 138 lb (62.6 kg)  Height: 5\' 7"  (1.702 m)      Assessment & Plan:

## 2016-11-21 NOTE — Assessment & Plan Note (Signed)
Refill her xanax which is for situational anxiety. She is working on exercise to help with her worry. She denies depression or SI/HI.

## 2016-11-21 NOTE — Patient Instructions (Signed)
It is okay to come off the amlodipine and watch the blood pressure. The goal is <130/80.

## 2016-11-22 DIAGNOSIS — N186 End stage renal disease: Secondary | ICD-10-CM | POA: Diagnosis not present

## 2016-11-22 DIAGNOSIS — N2581 Secondary hyperparathyroidism of renal origin: Secondary | ICD-10-CM | POA: Diagnosis not present

## 2016-11-24 DIAGNOSIS — N186 End stage renal disease: Secondary | ICD-10-CM | POA: Diagnosis not present

## 2016-11-24 DIAGNOSIS — N2581 Secondary hyperparathyroidism of renal origin: Secondary | ICD-10-CM | POA: Diagnosis not present

## 2016-11-26 DIAGNOSIS — N186 End stage renal disease: Secondary | ICD-10-CM | POA: Diagnosis not present

## 2016-11-26 DIAGNOSIS — N2581 Secondary hyperparathyroidism of renal origin: Secondary | ICD-10-CM | POA: Diagnosis not present

## 2016-11-28 DIAGNOSIS — M4802 Spinal stenosis, cervical region: Secondary | ICD-10-CM | POA: Diagnosis not present

## 2016-11-29 DIAGNOSIS — N186 End stage renal disease: Secondary | ICD-10-CM | POA: Diagnosis not present

## 2016-11-29 DIAGNOSIS — N2581 Secondary hyperparathyroidism of renal origin: Secondary | ICD-10-CM | POA: Diagnosis not present

## 2016-12-01 DIAGNOSIS — N2581 Secondary hyperparathyroidism of renal origin: Secondary | ICD-10-CM | POA: Diagnosis not present

## 2016-12-01 DIAGNOSIS — N186 End stage renal disease: Secondary | ICD-10-CM | POA: Diagnosis not present

## 2016-12-03 DIAGNOSIS — N2581 Secondary hyperparathyroidism of renal origin: Secondary | ICD-10-CM | POA: Diagnosis not present

## 2016-12-03 DIAGNOSIS — N186 End stage renal disease: Secondary | ICD-10-CM | POA: Diagnosis not present

## 2016-12-05 DIAGNOSIS — N2581 Secondary hyperparathyroidism of renal origin: Secondary | ICD-10-CM | POA: Diagnosis not present

## 2016-12-05 DIAGNOSIS — N186 End stage renal disease: Secondary | ICD-10-CM | POA: Diagnosis not present

## 2016-12-07 DIAGNOSIS — N2581 Secondary hyperparathyroidism of renal origin: Secondary | ICD-10-CM | POA: Diagnosis not present

## 2016-12-07 DIAGNOSIS — N186 End stage renal disease: Secondary | ICD-10-CM | POA: Diagnosis not present

## 2016-12-09 DIAGNOSIS — N186 End stage renal disease: Secondary | ICD-10-CM | POA: Diagnosis not present

## 2016-12-13 DIAGNOSIS — N2581 Secondary hyperparathyroidism of renal origin: Secondary | ICD-10-CM | POA: Diagnosis not present

## 2016-12-13 DIAGNOSIS — N186 End stage renal disease: Secondary | ICD-10-CM | POA: Diagnosis not present

## 2016-12-15 DIAGNOSIS — N2581 Secondary hyperparathyroidism of renal origin: Secondary | ICD-10-CM | POA: Diagnosis not present

## 2016-12-15 DIAGNOSIS — N186 End stage renal disease: Secondary | ICD-10-CM | POA: Diagnosis not present

## 2016-12-16 DIAGNOSIS — N033 Chronic nephritic syndrome with diffuse mesangial proliferative glomerulonephritis: Secondary | ICD-10-CM | POA: Diagnosis not present

## 2016-12-16 DIAGNOSIS — N186 End stage renal disease: Secondary | ICD-10-CM | POA: Diagnosis not present

## 2016-12-16 DIAGNOSIS — Z992 Dependence on renal dialysis: Secondary | ICD-10-CM | POA: Diagnosis not present

## 2016-12-17 DIAGNOSIS — N186 End stage renal disease: Secondary | ICD-10-CM | POA: Diagnosis not present

## 2016-12-17 DIAGNOSIS — N2581 Secondary hyperparathyroidism of renal origin: Secondary | ICD-10-CM | POA: Diagnosis not present

## 2016-12-17 DIAGNOSIS — D631 Anemia in chronic kidney disease: Secondary | ICD-10-CM | POA: Diagnosis not present

## 2016-12-20 DIAGNOSIS — D631 Anemia in chronic kidney disease: Secondary | ICD-10-CM | POA: Diagnosis not present

## 2016-12-20 DIAGNOSIS — N186 End stage renal disease: Secondary | ICD-10-CM | POA: Diagnosis not present

## 2016-12-20 DIAGNOSIS — N2581 Secondary hyperparathyroidism of renal origin: Secondary | ICD-10-CM | POA: Diagnosis not present

## 2016-12-22 DIAGNOSIS — N186 End stage renal disease: Secondary | ICD-10-CM | POA: Diagnosis not present

## 2016-12-22 DIAGNOSIS — N2581 Secondary hyperparathyroidism of renal origin: Secondary | ICD-10-CM | POA: Diagnosis not present

## 2016-12-22 DIAGNOSIS — D631 Anemia in chronic kidney disease: Secondary | ICD-10-CM | POA: Diagnosis not present

## 2016-12-24 DIAGNOSIS — N2581 Secondary hyperparathyroidism of renal origin: Secondary | ICD-10-CM | POA: Diagnosis not present

## 2016-12-24 DIAGNOSIS — N186 End stage renal disease: Secondary | ICD-10-CM | POA: Diagnosis not present

## 2016-12-24 DIAGNOSIS — D631 Anemia in chronic kidney disease: Secondary | ICD-10-CM | POA: Diagnosis not present

## 2016-12-27 DIAGNOSIS — D631 Anemia in chronic kidney disease: Secondary | ICD-10-CM | POA: Diagnosis not present

## 2016-12-27 DIAGNOSIS — N186 End stage renal disease: Secondary | ICD-10-CM | POA: Diagnosis not present

## 2016-12-27 DIAGNOSIS — N2581 Secondary hyperparathyroidism of renal origin: Secondary | ICD-10-CM | POA: Diagnosis not present

## 2016-12-29 DIAGNOSIS — D631 Anemia in chronic kidney disease: Secondary | ICD-10-CM | POA: Diagnosis not present

## 2016-12-29 DIAGNOSIS — N186 End stage renal disease: Secondary | ICD-10-CM | POA: Diagnosis not present

## 2016-12-29 DIAGNOSIS — N2581 Secondary hyperparathyroidism of renal origin: Secondary | ICD-10-CM | POA: Diagnosis not present

## 2016-12-31 DIAGNOSIS — N186 End stage renal disease: Secondary | ICD-10-CM | POA: Diagnosis not present

## 2016-12-31 DIAGNOSIS — D631 Anemia in chronic kidney disease: Secondary | ICD-10-CM | POA: Diagnosis not present

## 2016-12-31 DIAGNOSIS — N2581 Secondary hyperparathyroidism of renal origin: Secondary | ICD-10-CM | POA: Diagnosis not present

## 2017-01-03 DIAGNOSIS — N2581 Secondary hyperparathyroidism of renal origin: Secondary | ICD-10-CM | POA: Diagnosis not present

## 2017-01-03 DIAGNOSIS — N186 End stage renal disease: Secondary | ICD-10-CM | POA: Diagnosis not present

## 2017-01-03 DIAGNOSIS — D631 Anemia in chronic kidney disease: Secondary | ICD-10-CM | POA: Diagnosis not present

## 2017-01-05 ENCOUNTER — Telehealth: Payer: Self-pay | Admitting: Internal Medicine

## 2017-01-05 DIAGNOSIS — N186 End stage renal disease: Secondary | ICD-10-CM | POA: Diagnosis not present

## 2017-01-05 DIAGNOSIS — N2581 Secondary hyperparathyroidism of renal origin: Secondary | ICD-10-CM | POA: Diagnosis not present

## 2017-01-05 DIAGNOSIS — D631 Anemia in chronic kidney disease: Secondary | ICD-10-CM | POA: Diagnosis not present

## 2017-01-05 NOTE — Telephone Encounter (Unsigned)
Copied from [place] [phone]. Topic: Referral - Request >> Jan 05, 2017 11:11 AM Neva Seat wrote: Pt called stating her dialysis doctor is requesting she be seen for PT due to her current falls and balance.  Pt would like a call back to advise her what she needs to do next to get the help needed.

## 2017-01-05 NOTE — Telephone Encounter (Signed)
Patient informed states understanding. Says she will call back if dialysis doctor wont order PT

## 2017-01-05 NOTE — Telephone Encounter (Signed)
Her dialysis doctor should be able to order this PT.

## 2017-01-07 DIAGNOSIS — N186 End stage renal disease: Secondary | ICD-10-CM | POA: Diagnosis not present

## 2017-01-07 DIAGNOSIS — D631 Anemia in chronic kidney disease: Secondary | ICD-10-CM | POA: Diagnosis not present

## 2017-01-07 DIAGNOSIS — N2581 Secondary hyperparathyroidism of renal origin: Secondary | ICD-10-CM | POA: Diagnosis not present

## 2017-01-09 DIAGNOSIS — N2581 Secondary hyperparathyroidism of renal origin: Secondary | ICD-10-CM | POA: Diagnosis not present

## 2017-01-09 DIAGNOSIS — N186 End stage renal disease: Secondary | ICD-10-CM | POA: Diagnosis not present

## 2017-01-09 DIAGNOSIS — D631 Anemia in chronic kidney disease: Secondary | ICD-10-CM | POA: Diagnosis not present

## 2017-01-12 DIAGNOSIS — N186 End stage renal disease: Secondary | ICD-10-CM | POA: Diagnosis not present

## 2017-01-12 DIAGNOSIS — D631 Anemia in chronic kidney disease: Secondary | ICD-10-CM | POA: Diagnosis not present

## 2017-01-12 DIAGNOSIS — N2581 Secondary hyperparathyroidism of renal origin: Secondary | ICD-10-CM | POA: Diagnosis not present

## 2017-01-14 DIAGNOSIS — N2581 Secondary hyperparathyroidism of renal origin: Secondary | ICD-10-CM | POA: Diagnosis not present

## 2017-01-14 DIAGNOSIS — N186 End stage renal disease: Secondary | ICD-10-CM | POA: Diagnosis not present

## 2017-01-14 DIAGNOSIS — D631 Anemia in chronic kidney disease: Secondary | ICD-10-CM | POA: Diagnosis not present

## 2017-01-16 DIAGNOSIS — N2581 Secondary hyperparathyroidism of renal origin: Secondary | ICD-10-CM | POA: Diagnosis not present

## 2017-01-16 DIAGNOSIS — D631 Anemia in chronic kidney disease: Secondary | ICD-10-CM | POA: Diagnosis not present

## 2017-01-16 DIAGNOSIS — Z992 Dependence on renal dialysis: Secondary | ICD-10-CM | POA: Diagnosis not present

## 2017-01-16 DIAGNOSIS — N186 End stage renal disease: Secondary | ICD-10-CM | POA: Diagnosis not present

## 2017-01-16 DIAGNOSIS — N033 Chronic nephritic syndrome with diffuse mesangial proliferative glomerulonephritis: Secondary | ICD-10-CM | POA: Diagnosis not present

## 2017-01-19 DIAGNOSIS — D509 Iron deficiency anemia, unspecified: Secondary | ICD-10-CM | POA: Diagnosis not present

## 2017-01-19 DIAGNOSIS — N2581 Secondary hyperparathyroidism of renal origin: Secondary | ICD-10-CM | POA: Diagnosis not present

## 2017-01-19 DIAGNOSIS — D631 Anemia in chronic kidney disease: Secondary | ICD-10-CM | POA: Diagnosis not present

## 2017-01-19 DIAGNOSIS — N186 End stage renal disease: Secondary | ICD-10-CM | POA: Diagnosis not present

## 2017-01-20 DIAGNOSIS — G4700 Insomnia, unspecified: Secondary | ICD-10-CM | POA: Diagnosis present

## 2017-01-20 DIAGNOSIS — J810 Acute pulmonary edema: Secondary | ICD-10-CM | POA: Diagnosis not present

## 2017-01-20 DIAGNOSIS — D649 Anemia, unspecified: Secondary | ICD-10-CM | POA: Diagnosis not present

## 2017-01-20 DIAGNOSIS — Z79899 Other long term (current) drug therapy: Secondary | ICD-10-CM | POA: Diagnosis not present

## 2017-01-20 DIAGNOSIS — Z48298 Encounter for aftercare following other organ transplant: Secondary | ICD-10-CM | POA: Diagnosis not present

## 2017-01-20 DIAGNOSIS — G039 Meningitis, unspecified: Secondary | ICD-10-CM | POA: Diagnosis not present

## 2017-01-20 DIAGNOSIS — N2581 Secondary hyperparathyroidism of renal origin: Secondary | ICD-10-CM | POA: Diagnosis not present

## 2017-01-20 DIAGNOSIS — Z992 Dependence on renal dialysis: Secondary | ICD-10-CM | POA: Diagnosis not present

## 2017-01-20 DIAGNOSIS — T8611 Kidney transplant rejection: Secondary | ICD-10-CM | POA: Diagnosis not present

## 2017-01-20 DIAGNOSIS — J9600 Acute respiratory failure, unspecified whether with hypoxia or hypercapnia: Secondary | ICD-10-CM | POA: Diagnosis not present

## 2017-01-20 DIAGNOSIS — Z5181 Encounter for therapeutic drug level monitoring: Secondary | ICD-10-CM | POA: Diagnosis not present

## 2017-01-20 DIAGNOSIS — Z7982 Long term (current) use of aspirin: Secondary | ICD-10-CM | POA: Diagnosis not present

## 2017-01-20 DIAGNOSIS — Z880 Allergy status to penicillin: Secondary | ICD-10-CM | POA: Diagnosis not present

## 2017-01-20 DIAGNOSIS — Z01818 Encounter for other preprocedural examination: Secondary | ICD-10-CM | POA: Diagnosis not present

## 2017-01-20 DIAGNOSIS — Z95828 Presence of other vascular implants and grafts: Secondary | ICD-10-CM | POA: Diagnosis not present

## 2017-01-20 DIAGNOSIS — Z01810 Encounter for preprocedural cardiovascular examination: Secondary | ICD-10-CM | POA: Diagnosis not present

## 2017-01-20 DIAGNOSIS — F17210 Nicotine dependence, cigarettes, uncomplicated: Secondary | ICD-10-CM | POA: Diagnosis present

## 2017-01-20 DIAGNOSIS — T8619 Other complication of kidney transplant: Secondary | ICD-10-CM | POA: Diagnosis not present

## 2017-01-20 DIAGNOSIS — I10 Essential (primary) hypertension: Secondary | ICD-10-CM | POA: Diagnosis present

## 2017-01-20 DIAGNOSIS — Z905 Acquired absence of kidney: Secondary | ICD-10-CM | POA: Diagnosis not present

## 2017-01-20 DIAGNOSIS — T8612 Kidney transplant failure: Secondary | ICD-10-CM | POA: Diagnosis not present

## 2017-01-20 DIAGNOSIS — I120 Hypertensive chronic kidney disease with stage 5 chronic kidney disease or end stage renal disease: Secondary | ICD-10-CM | POA: Diagnosis not present

## 2017-01-20 DIAGNOSIS — N059 Unspecified nephritic syndrome with unspecified morphologic changes: Secondary | ICD-10-CM | POA: Diagnosis present

## 2017-01-20 DIAGNOSIS — N186 End stage renal disease: Secondary | ICD-10-CM | POA: Diagnosis present

## 2017-01-20 DIAGNOSIS — E878 Other disorders of electrolyte and fluid balance, not elsewhere classified: Secondary | ICD-10-CM | POA: Diagnosis not present

## 2017-01-20 DIAGNOSIS — D62 Acute posthemorrhagic anemia: Secondary | ICD-10-CM | POA: Diagnosis not present

## 2017-01-20 DIAGNOSIS — Z4822 Encounter for aftercare following kidney transplant: Secondary | ICD-10-CM | POA: Diagnosis not present

## 2017-01-20 DIAGNOSIS — E872 Acidosis: Secondary | ICD-10-CM | POA: Diagnosis not present

## 2017-01-20 DIAGNOSIS — Z885 Allergy status to narcotic agent status: Secondary | ICD-10-CM | POA: Diagnosis not present

## 2017-01-20 DIAGNOSIS — D631 Anemia in chronic kidney disease: Secondary | ICD-10-CM | POA: Diagnosis present

## 2017-01-20 DIAGNOSIS — E871 Hypo-osmolality and hyponatremia: Secondary | ICD-10-CM | POA: Diagnosis not present

## 2017-01-20 DIAGNOSIS — Z940 Kidney transplant status: Secondary | ICD-10-CM | POA: Diagnosis not present

## 2017-01-21 DIAGNOSIS — Z940 Kidney transplant status: Secondary | ICD-10-CM

## 2017-01-21 HISTORY — PX: KIDNEY TRANSPLANT: SHX239

## 2017-01-21 HISTORY — DX: Kidney transplant status: Z94.0

## 2017-01-27 DIAGNOSIS — Z7952 Long term (current) use of systemic steroids: Secondary | ICD-10-CM | POA: Diagnosis not present

## 2017-01-27 DIAGNOSIS — R760 Raised antibody titer: Secondary | ICD-10-CM | POA: Diagnosis not present

## 2017-01-27 DIAGNOSIS — Z1159 Encounter for screening for other viral diseases: Secondary | ICD-10-CM | POA: Diagnosis not present

## 2017-01-27 DIAGNOSIS — D8989 Other specified disorders involving the immune mechanism, not elsewhere classified: Secondary | ICD-10-CM | POA: Diagnosis not present

## 2017-01-27 DIAGNOSIS — Z79899 Other long term (current) drug therapy: Secondary | ICD-10-CM | POA: Diagnosis not present

## 2017-01-27 DIAGNOSIS — D649 Anemia, unspecified: Secondary | ICD-10-CM | POA: Diagnosis not present

## 2017-01-27 DIAGNOSIS — Z792 Long term (current) use of antibiotics: Secondary | ICD-10-CM | POA: Diagnosis not present

## 2017-01-27 DIAGNOSIS — Z940 Kidney transplant status: Secondary | ICD-10-CM | POA: Diagnosis not present

## 2017-01-27 DIAGNOSIS — E871 Hypo-osmolality and hyponatremia: Secondary | ICD-10-CM | POA: Diagnosis not present

## 2017-01-27 DIAGNOSIS — I10 Essential (primary) hypertension: Secondary | ICD-10-CM | POA: Diagnosis not present

## 2017-01-27 DIAGNOSIS — Z7983 Long term (current) use of bisphosphonates: Secondary | ICD-10-CM | POA: Diagnosis not present

## 2017-01-30 DIAGNOSIS — Z79899 Other long term (current) drug therapy: Secondary | ICD-10-CM | POA: Diagnosis not present

## 2017-01-30 DIAGNOSIS — Z940 Kidney transplant status: Secondary | ICD-10-CM | POA: Diagnosis not present

## 2017-01-30 DIAGNOSIS — Z5181 Encounter for therapeutic drug level monitoring: Secondary | ICD-10-CM | POA: Diagnosis not present

## 2017-01-30 DIAGNOSIS — Z4822 Encounter for aftercare following kidney transplant: Secondary | ICD-10-CM | POA: Diagnosis not present

## 2017-01-31 DIAGNOSIS — Z940 Kidney transplant status: Secondary | ICD-10-CM | POA: Diagnosis not present

## 2017-01-31 DIAGNOSIS — R1110 Vomiting, unspecified: Secondary | ICD-10-CM | POA: Diagnosis not present

## 2017-01-31 DIAGNOSIS — R1032 Left lower quadrant pain: Secondary | ICD-10-CM | POA: Diagnosis not present

## 2017-01-31 DIAGNOSIS — Z7982 Long term (current) use of aspirin: Secondary | ICD-10-CM | POA: Diagnosis not present

## 2017-01-31 DIAGNOSIS — K5900 Constipation, unspecified: Secondary | ICD-10-CM | POA: Diagnosis not present

## 2017-01-31 DIAGNOSIS — T8611 Kidney transplant rejection: Secondary | ICD-10-CM | POA: Diagnosis not present

## 2017-01-31 DIAGNOSIS — I10 Essential (primary) hypertension: Secondary | ICD-10-CM | POA: Diagnosis not present

## 2017-01-31 DIAGNOSIS — R112 Nausea with vomiting, unspecified: Secondary | ICD-10-CM | POA: Diagnosis not present

## 2017-02-01 DIAGNOSIS — R112 Nausea with vomiting, unspecified: Secondary | ICD-10-CM | POA: Diagnosis not present

## 2017-02-01 DIAGNOSIS — Z940 Kidney transplant status: Secondary | ICD-10-CM | POA: Diagnosis not present

## 2017-02-01 DIAGNOSIS — Z7982 Long term (current) use of aspirin: Secondary | ICD-10-CM | POA: Diagnosis not present

## 2017-02-01 DIAGNOSIS — K5901 Slow transit constipation: Secondary | ICD-10-CM | POA: Diagnosis present

## 2017-02-01 DIAGNOSIS — F17210 Nicotine dependence, cigarettes, uncomplicated: Secondary | ICD-10-CM | POA: Diagnosis present

## 2017-02-01 DIAGNOSIS — Z886 Allergy status to analgesic agent status: Secondary | ICD-10-CM | POA: Diagnosis not present

## 2017-02-01 DIAGNOSIS — K5900 Constipation, unspecified: Secondary | ICD-10-CM | POA: Diagnosis not present

## 2017-02-01 DIAGNOSIS — I10 Essential (primary) hypertension: Secondary | ICD-10-CM | POA: Diagnosis present

## 2017-02-01 DIAGNOSIS — Z8489 Family history of other specified conditions: Secondary | ICD-10-CM | POA: Diagnosis not present

## 2017-02-01 DIAGNOSIS — Z888 Allergy status to other drugs, medicaments and biological substances status: Secondary | ICD-10-CM | POA: Diagnosis not present

## 2017-02-01 DIAGNOSIS — R109 Unspecified abdominal pain: Secondary | ICD-10-CM | POA: Diagnosis not present

## 2017-02-01 DIAGNOSIS — Z8042 Family history of malignant neoplasm of prostate: Secondary | ICD-10-CM | POA: Diagnosis not present

## 2017-02-01 DIAGNOSIS — R1110 Vomiting, unspecified: Secondary | ICD-10-CM | POA: Diagnosis not present

## 2017-02-01 DIAGNOSIS — Z79899 Other long term (current) drug therapy: Secondary | ICD-10-CM | POA: Diagnosis not present

## 2017-02-01 DIAGNOSIS — Z880 Allergy status to penicillin: Secondary | ICD-10-CM | POA: Diagnosis not present

## 2017-02-01 DIAGNOSIS — R1032 Left lower quadrant pain: Secondary | ICD-10-CM | POA: Diagnosis not present

## 2017-02-03 DIAGNOSIS — E876 Hypokalemia: Secondary | ICD-10-CM | POA: Diagnosis not present

## 2017-02-03 DIAGNOSIS — Z79899 Other long term (current) drug therapy: Secondary | ICD-10-CM | POA: Diagnosis not present

## 2017-02-03 DIAGNOSIS — E872 Acidosis: Secondary | ICD-10-CM | POA: Diagnosis not present

## 2017-02-03 DIAGNOSIS — D72829 Elevated white blood cell count, unspecified: Secondary | ICD-10-CM | POA: Diagnosis not present

## 2017-02-03 DIAGNOSIS — J449 Chronic obstructive pulmonary disease, unspecified: Secondary | ICD-10-CM | POA: Diagnosis not present

## 2017-02-03 DIAGNOSIS — Z4822 Encounter for aftercare following kidney transplant: Secondary | ICD-10-CM | POA: Diagnosis not present

## 2017-02-03 DIAGNOSIS — T8611 Kidney transplant rejection: Secondary | ICD-10-CM | POA: Diagnosis not present

## 2017-02-03 DIAGNOSIS — Z940 Kidney transplant status: Secondary | ICD-10-CM | POA: Diagnosis not present

## 2017-02-03 DIAGNOSIS — Z01818 Encounter for other preprocedural examination: Secondary | ICD-10-CM | POA: Diagnosis not present

## 2017-02-03 DIAGNOSIS — F17210 Nicotine dependence, cigarettes, uncomplicated: Secondary | ICD-10-CM | POA: Diagnosis not present

## 2017-02-03 DIAGNOSIS — D649 Anemia, unspecified: Secondary | ICD-10-CM | POA: Diagnosis not present

## 2017-02-03 DIAGNOSIS — D8989 Other specified disorders involving the immune mechanism, not elsewhere classified: Secondary | ICD-10-CM | POA: Diagnosis not present

## 2017-02-03 DIAGNOSIS — Z792 Long term (current) use of antibiotics: Secondary | ICD-10-CM | POA: Diagnosis not present

## 2017-02-03 DIAGNOSIS — Z7952 Long term (current) use of systemic steroids: Secondary | ICD-10-CM | POA: Diagnosis not present

## 2017-02-03 DIAGNOSIS — R000 Tachycardia, unspecified: Secondary | ICD-10-CM | POA: Diagnosis not present

## 2017-02-03 DIAGNOSIS — I10 Essential (primary) hypertension: Secondary | ICD-10-CM | POA: Diagnosis not present

## 2017-02-07 DIAGNOSIS — I151 Hypertension secondary to other renal disorders: Secondary | ICD-10-CM | POA: Diagnosis not present

## 2017-02-07 DIAGNOSIS — Z79899 Other long term (current) drug therapy: Secondary | ICD-10-CM | POA: Diagnosis not present

## 2017-02-07 DIAGNOSIS — E876 Hypokalemia: Secondary | ICD-10-CM | POA: Diagnosis not present

## 2017-02-07 DIAGNOSIS — Z792 Long term (current) use of antibiotics: Secondary | ICD-10-CM | POA: Diagnosis not present

## 2017-02-07 DIAGNOSIS — F17210 Nicotine dependence, cigarettes, uncomplicated: Secondary | ICD-10-CM | POA: Diagnosis not present

## 2017-02-07 DIAGNOSIS — E871 Hypo-osmolality and hyponatremia: Secondary | ICD-10-CM | POA: Diagnosis not present

## 2017-02-07 DIAGNOSIS — D631 Anemia in chronic kidney disease: Secondary | ICD-10-CM | POA: Diagnosis not present

## 2017-02-07 DIAGNOSIS — I10 Essential (primary) hypertension: Secondary | ICD-10-CM | POA: Diagnosis not present

## 2017-02-07 DIAGNOSIS — T8619 Other complication of kidney transplant: Secondary | ICD-10-CM | POA: Diagnosis not present

## 2017-02-07 DIAGNOSIS — N183 Chronic kidney disease, stage 3 (moderate): Secondary | ICD-10-CM | POA: Diagnosis not present

## 2017-02-07 DIAGNOSIS — Z4822 Encounter for aftercare following kidney transplant: Secondary | ICD-10-CM | POA: Diagnosis not present

## 2017-02-07 DIAGNOSIS — Z7983 Long term (current) use of bisphosphonates: Secondary | ICD-10-CM | POA: Diagnosis not present

## 2017-02-07 DIAGNOSIS — Z940 Kidney transplant status: Secondary | ICD-10-CM | POA: Diagnosis not present

## 2017-02-07 DIAGNOSIS — D8989 Other specified disorders involving the immune mechanism, not elsewhere classified: Secondary | ICD-10-CM | POA: Diagnosis not present

## 2017-02-07 DIAGNOSIS — Z5181 Encounter for therapeutic drug level monitoring: Secondary | ICD-10-CM | POA: Diagnosis not present

## 2017-02-07 DIAGNOSIS — J449 Chronic obstructive pulmonary disease, unspecified: Secondary | ICD-10-CM | POA: Diagnosis not present

## 2017-02-07 DIAGNOSIS — R760 Raised antibody titer: Secondary | ICD-10-CM | POA: Diagnosis not present

## 2017-02-07 DIAGNOSIS — Z7952 Long term (current) use of systemic steroids: Secondary | ICD-10-CM | POA: Diagnosis not present

## 2017-02-07 DIAGNOSIS — E872 Acidosis: Secondary | ICD-10-CM | POA: Diagnosis not present

## 2017-02-07 DIAGNOSIS — D649 Anemia, unspecified: Secondary | ICD-10-CM | POA: Diagnosis not present

## 2017-02-08 DIAGNOSIS — T8611 Kidney transplant rejection: Secondary | ICD-10-CM | POA: Diagnosis not present

## 2017-02-10 DIAGNOSIS — Z01818 Encounter for other preprocedural examination: Secondary | ICD-10-CM | POA: Diagnosis not present

## 2017-02-10 DIAGNOSIS — I10 Essential (primary) hypertension: Secondary | ICD-10-CM | POA: Diagnosis not present

## 2017-02-10 DIAGNOSIS — D631 Anemia in chronic kidney disease: Secondary | ICD-10-CM | POA: Diagnosis not present

## 2017-02-10 DIAGNOSIS — D899 Disorder involving the immune mechanism, unspecified: Secondary | ICD-10-CM | POA: Diagnosis not present

## 2017-02-10 DIAGNOSIS — Z940 Kidney transplant status: Secondary | ICD-10-CM | POA: Diagnosis not present

## 2017-02-10 DIAGNOSIS — Z79899 Other long term (current) drug therapy: Secondary | ICD-10-CM | POA: Diagnosis not present

## 2017-02-10 DIAGNOSIS — N186 End stage renal disease: Secondary | ICD-10-CM | POA: Diagnosis not present

## 2017-02-10 DIAGNOSIS — T8611 Kidney transplant rejection: Secondary | ICD-10-CM | POA: Diagnosis not present

## 2017-02-10 DIAGNOSIS — I120 Hypertensive chronic kidney disease with stage 5 chronic kidney disease or end stage renal disease: Secondary | ICD-10-CM | POA: Diagnosis not present

## 2017-02-10 DIAGNOSIS — E872 Acidosis: Secondary | ICD-10-CM | POA: Diagnosis not present

## 2017-02-11 DIAGNOSIS — Z79899 Other long term (current) drug therapy: Secondary | ICD-10-CM | POA: Diagnosis not present

## 2017-02-11 DIAGNOSIS — D631 Anemia in chronic kidney disease: Secondary | ICD-10-CM | POA: Diagnosis not present

## 2017-02-11 DIAGNOSIS — E872 Acidosis: Secondary | ICD-10-CM | POA: Diagnosis not present

## 2017-02-11 DIAGNOSIS — D899 Disorder involving the immune mechanism, unspecified: Secondary | ICD-10-CM | POA: Diagnosis not present

## 2017-02-11 DIAGNOSIS — I10 Essential (primary) hypertension: Secondary | ICD-10-CM | POA: Diagnosis not present

## 2017-02-11 DIAGNOSIS — I120 Hypertensive chronic kidney disease with stage 5 chronic kidney disease or end stage renal disease: Secondary | ICD-10-CM | POA: Diagnosis not present

## 2017-02-11 DIAGNOSIS — T8611 Kidney transplant rejection: Secondary | ICD-10-CM | POA: Diagnosis not present

## 2017-02-11 DIAGNOSIS — Z940 Kidney transplant status: Secondary | ICD-10-CM | POA: Diagnosis not present

## 2017-02-11 DIAGNOSIS — N186 End stage renal disease: Secondary | ICD-10-CM | POA: Diagnosis not present

## 2017-02-13 DIAGNOSIS — Z4822 Encounter for aftercare following kidney transplant: Secondary | ICD-10-CM | POA: Diagnosis not present

## 2017-02-13 DIAGNOSIS — Z940 Kidney transplant status: Secondary | ICD-10-CM | POA: Diagnosis not present

## 2017-02-16 DIAGNOSIS — Z7983 Long term (current) use of bisphosphonates: Secondary | ICD-10-CM | POA: Diagnosis not present

## 2017-02-16 DIAGNOSIS — Z4822 Encounter for aftercare following kidney transplant: Secondary | ICD-10-CM | POA: Diagnosis not present

## 2017-02-16 DIAGNOSIS — J449 Chronic obstructive pulmonary disease, unspecified: Secondary | ICD-10-CM | POA: Diagnosis not present

## 2017-02-16 DIAGNOSIS — R638 Other symptoms and signs concerning food and fluid intake: Secondary | ICD-10-CM | POA: Diagnosis not present

## 2017-02-16 DIAGNOSIS — I10 Essential (primary) hypertension: Secondary | ICD-10-CM | POA: Diagnosis not present

## 2017-02-16 DIAGNOSIS — D649 Anemia, unspecified: Secondary | ICD-10-CM | POA: Diagnosis not present

## 2017-02-16 DIAGNOSIS — R197 Diarrhea, unspecified: Secondary | ICD-10-CM | POA: Diagnosis not present

## 2017-02-16 DIAGNOSIS — Z79899 Other long term (current) drug therapy: Secondary | ICD-10-CM | POA: Diagnosis not present

## 2017-02-16 DIAGNOSIS — F17210 Nicotine dependence, cigarettes, uncomplicated: Secondary | ICD-10-CM | POA: Diagnosis not present

## 2017-02-16 DIAGNOSIS — E871 Hypo-osmolality and hyponatremia: Secondary | ICD-10-CM | POA: Diagnosis not present

## 2017-02-16 DIAGNOSIS — D8989 Other specified disorders involving the immune mechanism, not elsewhere classified: Secondary | ICD-10-CM | POA: Diagnosis not present

## 2017-02-16 DIAGNOSIS — E872 Acidosis: Secondary | ICD-10-CM | POA: Diagnosis not present

## 2017-02-16 DIAGNOSIS — Z792 Long term (current) use of antibiotics: Secondary | ICD-10-CM | POA: Diagnosis not present

## 2017-02-16 DIAGNOSIS — Z7952 Long term (current) use of systemic steroids: Secondary | ICD-10-CM | POA: Diagnosis not present

## 2017-02-16 DIAGNOSIS — E876 Hypokalemia: Secondary | ICD-10-CM | POA: Diagnosis not present

## 2017-02-16 DIAGNOSIS — Z940 Kidney transplant status: Secondary | ICD-10-CM | POA: Diagnosis not present

## 2017-02-20 DIAGNOSIS — F17210 Nicotine dependence, cigarettes, uncomplicated: Secondary | ICD-10-CM | POA: Diagnosis not present

## 2017-02-20 DIAGNOSIS — Z7952 Long term (current) use of systemic steroids: Secondary | ICD-10-CM | POA: Diagnosis not present

## 2017-02-20 DIAGNOSIS — J449 Chronic obstructive pulmonary disease, unspecified: Secondary | ICD-10-CM | POA: Diagnosis not present

## 2017-02-20 DIAGNOSIS — E872 Acidosis: Secondary | ICD-10-CM | POA: Diagnosis not present

## 2017-02-20 DIAGNOSIS — R768 Other specified abnormal immunological findings in serum: Secondary | ICD-10-CM | POA: Diagnosis not present

## 2017-02-20 DIAGNOSIS — D649 Anemia, unspecified: Secondary | ICD-10-CM | POA: Diagnosis not present

## 2017-02-20 DIAGNOSIS — I4949 Other premature depolarization: Secondary | ICD-10-CM | POA: Diagnosis not present

## 2017-02-20 DIAGNOSIS — E876 Hypokalemia: Secondary | ICD-10-CM | POA: Diagnosis not present

## 2017-02-20 DIAGNOSIS — Z4822 Encounter for aftercare following kidney transplant: Secondary | ICD-10-CM | POA: Diagnosis not present

## 2017-02-20 DIAGNOSIS — D8989 Other specified disorders involving the immune mechanism, not elsewhere classified: Secondary | ICD-10-CM | POA: Diagnosis not present

## 2017-02-20 DIAGNOSIS — Z792 Long term (current) use of antibiotics: Secondary | ICD-10-CM | POA: Diagnosis not present

## 2017-02-20 DIAGNOSIS — I10 Essential (primary) hypertension: Secondary | ICD-10-CM | POA: Diagnosis not present

## 2017-02-20 DIAGNOSIS — Z940 Kidney transplant status: Secondary | ICD-10-CM | POA: Diagnosis not present

## 2017-02-20 DIAGNOSIS — R000 Tachycardia, unspecified: Secondary | ICD-10-CM | POA: Diagnosis not present

## 2017-02-20 DIAGNOSIS — Z79899 Other long term (current) drug therapy: Secondary | ICD-10-CM | POA: Diagnosis not present

## 2017-02-24 DIAGNOSIS — E872 Acidosis: Secondary | ICD-10-CM | POA: Diagnosis not present

## 2017-02-24 DIAGNOSIS — Z79899 Other long term (current) drug therapy: Secondary | ICD-10-CM | POA: Diagnosis not present

## 2017-02-24 DIAGNOSIS — Z466 Encounter for fitting and adjustment of urinary device: Secondary | ICD-10-CM | POA: Diagnosis not present

## 2017-02-24 DIAGNOSIS — J449 Chronic obstructive pulmonary disease, unspecified: Secondary | ICD-10-CM | POA: Diagnosis not present

## 2017-02-24 DIAGNOSIS — I10 Essential (primary) hypertension: Secondary | ICD-10-CM | POA: Diagnosis not present

## 2017-02-24 DIAGNOSIS — Z7952 Long term (current) use of systemic steroids: Secondary | ICD-10-CM | POA: Diagnosis not present

## 2017-02-24 DIAGNOSIS — Z7983 Long term (current) use of bisphosphonates: Secondary | ICD-10-CM | POA: Diagnosis not present

## 2017-02-24 DIAGNOSIS — R000 Tachycardia, unspecified: Secondary | ICD-10-CM | POA: Diagnosis not present

## 2017-02-24 DIAGNOSIS — Z940 Kidney transplant status: Secondary | ICD-10-CM | POA: Diagnosis not present

## 2017-02-24 DIAGNOSIS — E878 Other disorders of electrolyte and fluid balance, not elsewhere classified: Secondary | ICD-10-CM | POA: Diagnosis not present

## 2017-02-24 DIAGNOSIS — E876 Hypokalemia: Secondary | ICD-10-CM | POA: Diagnosis not present

## 2017-02-24 DIAGNOSIS — D649 Anemia, unspecified: Secondary | ICD-10-CM | POA: Diagnosis not present

## 2017-02-24 DIAGNOSIS — Z792 Long term (current) use of antibiotics: Secondary | ICD-10-CM | POA: Diagnosis not present

## 2017-02-24 DIAGNOSIS — D8989 Other specified disorders involving the immune mechanism, not elsewhere classified: Secondary | ICD-10-CM | POA: Diagnosis not present

## 2017-02-24 DIAGNOSIS — Z4822 Encounter for aftercare following kidney transplant: Secondary | ICD-10-CM | POA: Diagnosis not present

## 2017-02-24 DIAGNOSIS — Z87891 Personal history of nicotine dependence: Secondary | ICD-10-CM | POA: Diagnosis not present

## 2017-02-28 DIAGNOSIS — Z7952 Long term (current) use of systemic steroids: Secondary | ICD-10-CM | POA: Diagnosis not present

## 2017-02-28 DIAGNOSIS — N269 Renal sclerosis, unspecified: Secondary | ICD-10-CM | POA: Diagnosis not present

## 2017-02-28 DIAGNOSIS — T8691 Unspecified transplanted organ and tissue rejection: Secondary | ICD-10-CM | POA: Diagnosis not present

## 2017-02-28 DIAGNOSIS — Z940 Kidney transplant status: Secondary | ICD-10-CM | POA: Diagnosis not present

## 2017-02-28 DIAGNOSIS — Z4822 Encounter for aftercare following kidney transplant: Secondary | ICD-10-CM | POA: Diagnosis not present

## 2017-02-28 DIAGNOSIS — Z79899 Other long term (current) drug therapy: Secondary | ICD-10-CM | POA: Diagnosis not present

## 2017-02-28 DIAGNOSIS — Z5181 Encounter for therapeutic drug level monitoring: Secondary | ICD-10-CM | POA: Diagnosis not present

## 2017-02-28 DIAGNOSIS — E872 Acidosis: Secondary | ICD-10-CM | POA: Diagnosis not present

## 2017-03-01 DIAGNOSIS — T8691 Unspecified transplanted organ and tissue rejection: Secondary | ICD-10-CM | POA: Diagnosis not present

## 2017-03-01 DIAGNOSIS — R000 Tachycardia, unspecified: Secondary | ICD-10-CM | POA: Diagnosis not present

## 2017-03-01 DIAGNOSIS — Z940 Kidney transplant status: Secondary | ICD-10-CM | POA: Diagnosis not present

## 2017-03-01 DIAGNOSIS — E872 Acidosis: Secondary | ICD-10-CM | POA: Diagnosis not present

## 2017-03-01 DIAGNOSIS — Z5181 Encounter for therapeutic drug level monitoring: Secondary | ICD-10-CM | POA: Diagnosis not present

## 2017-03-01 DIAGNOSIS — Z7952 Long term (current) use of systemic steroids: Secondary | ICD-10-CM | POA: Diagnosis not present

## 2017-03-01 DIAGNOSIS — Z4822 Encounter for aftercare following kidney transplant: Secondary | ICD-10-CM | POA: Diagnosis not present

## 2017-03-01 DIAGNOSIS — Z79899 Other long term (current) drug therapy: Secondary | ICD-10-CM | POA: Diagnosis not present

## 2017-03-03 DIAGNOSIS — E876 Hypokalemia: Secondary | ICD-10-CM | POA: Diagnosis not present

## 2017-03-03 DIAGNOSIS — Z4822 Encounter for aftercare following kidney transplant: Secondary | ICD-10-CM | POA: Diagnosis not present

## 2017-03-03 DIAGNOSIS — E872 Acidosis: Secondary | ICD-10-CM | POA: Diagnosis not present

## 2017-03-03 DIAGNOSIS — Z87891 Personal history of nicotine dependence: Secondary | ICD-10-CM | POA: Diagnosis not present

## 2017-03-03 DIAGNOSIS — N183 Chronic kidney disease, stage 3 (moderate): Secondary | ICD-10-CM | POA: Diagnosis not present

## 2017-03-03 DIAGNOSIS — J449 Chronic obstructive pulmonary disease, unspecified: Secondary | ICD-10-CM | POA: Diagnosis not present

## 2017-03-03 DIAGNOSIS — Z792 Long term (current) use of antibiotics: Secondary | ICD-10-CM | POA: Diagnosis not present

## 2017-03-03 DIAGNOSIS — D631 Anemia in chronic kidney disease: Secondary | ICD-10-CM | POA: Diagnosis not present

## 2017-03-03 DIAGNOSIS — Z79899 Other long term (current) drug therapy: Secondary | ICD-10-CM | POA: Diagnosis not present

## 2017-03-03 DIAGNOSIS — D649 Anemia, unspecified: Secondary | ICD-10-CM | POA: Diagnosis not present

## 2017-03-03 DIAGNOSIS — D8989 Other specified disorders involving the immune mechanism, not elsewhere classified: Secondary | ICD-10-CM | POA: Diagnosis not present

## 2017-03-03 DIAGNOSIS — Z940 Kidney transplant status: Secondary | ICD-10-CM | POA: Diagnosis not present

## 2017-03-03 DIAGNOSIS — I10 Essential (primary) hypertension: Secondary | ICD-10-CM | POA: Diagnosis not present

## 2017-03-10 DIAGNOSIS — D649 Anemia, unspecified: Secondary | ICD-10-CM | POA: Diagnosis not present

## 2017-03-10 DIAGNOSIS — N269 Renal sclerosis, unspecified: Secondary | ICD-10-CM | POA: Diagnosis not present

## 2017-03-10 DIAGNOSIS — Z4822 Encounter for aftercare following kidney transplant: Secondary | ICD-10-CM | POA: Diagnosis not present

## 2017-03-10 DIAGNOSIS — I10 Essential (primary) hypertension: Secondary | ICD-10-CM | POA: Diagnosis not present

## 2017-03-10 DIAGNOSIS — E876 Hypokalemia: Secondary | ICD-10-CM | POA: Diagnosis not present

## 2017-03-10 DIAGNOSIS — E872 Acidosis: Secondary | ICD-10-CM | POA: Diagnosis not present

## 2017-03-10 DIAGNOSIS — Z87891 Personal history of nicotine dependence: Secondary | ICD-10-CM | POA: Diagnosis not present

## 2017-03-10 DIAGNOSIS — Z7952 Long term (current) use of systemic steroids: Secondary | ICD-10-CM | POA: Diagnosis not present

## 2017-03-10 DIAGNOSIS — Z79899 Other long term (current) drug therapy: Secondary | ICD-10-CM | POA: Diagnosis not present

## 2017-03-10 DIAGNOSIS — J449 Chronic obstructive pulmonary disease, unspecified: Secondary | ICD-10-CM | POA: Diagnosis not present

## 2017-03-10 DIAGNOSIS — F17200 Nicotine dependence, unspecified, uncomplicated: Secondary | ICD-10-CM | POA: Diagnosis not present

## 2017-03-10 DIAGNOSIS — R760 Raised antibody titer: Secondary | ICD-10-CM | POA: Diagnosis not present

## 2017-03-10 DIAGNOSIS — T8612 Kidney transplant failure: Secondary | ICD-10-CM | POA: Diagnosis not present

## 2017-03-10 DIAGNOSIS — N261 Atrophy of kidney (terminal): Secondary | ICD-10-CM | POA: Diagnosis not present

## 2017-03-10 DIAGNOSIS — Z940 Kidney transplant status: Secondary | ICD-10-CM | POA: Diagnosis not present

## 2017-03-10 DIAGNOSIS — R000 Tachycardia, unspecified: Secondary | ICD-10-CM | POA: Diagnosis not present

## 2017-03-14 DIAGNOSIS — Z01818 Encounter for other preprocedural examination: Secondary | ICD-10-CM | POA: Diagnosis not present

## 2017-03-14 DIAGNOSIS — T8611 Kidney transplant rejection: Secondary | ICD-10-CM | POA: Diagnosis not present

## 2017-03-21 DIAGNOSIS — Z87891 Personal history of nicotine dependence: Secondary | ICD-10-CM | POA: Diagnosis not present

## 2017-03-21 DIAGNOSIS — E872 Acidosis: Secondary | ICD-10-CM | POA: Diagnosis not present

## 2017-03-21 DIAGNOSIS — N269 Renal sclerosis, unspecified: Secondary | ICD-10-CM | POA: Diagnosis not present

## 2017-03-21 DIAGNOSIS — Z4822 Encounter for aftercare following kidney transplant: Secondary | ICD-10-CM | POA: Diagnosis not present

## 2017-03-21 DIAGNOSIS — M5410 Radiculopathy, site unspecified: Secondary | ICD-10-CM | POA: Diagnosis not present

## 2017-03-21 DIAGNOSIS — D8989 Other specified disorders involving the immune mechanism, not elsewhere classified: Secondary | ICD-10-CM | POA: Diagnosis not present

## 2017-03-21 DIAGNOSIS — N12 Tubulo-interstitial nephritis, not specified as acute or chronic: Secondary | ICD-10-CM | POA: Diagnosis not present

## 2017-03-21 DIAGNOSIS — Z01818 Encounter for other preprocedural examination: Secondary | ICD-10-CM | POA: Diagnosis not present

## 2017-03-21 DIAGNOSIS — T8619 Other complication of kidney transplant: Secondary | ICD-10-CM | POA: Diagnosis not present

## 2017-03-21 DIAGNOSIS — D649 Anemia, unspecified: Secondary | ICD-10-CM | POA: Diagnosis not present

## 2017-03-21 DIAGNOSIS — Z7983 Long term (current) use of bisphosphonates: Secondary | ICD-10-CM | POA: Diagnosis not present

## 2017-03-21 DIAGNOSIS — E876 Hypokalemia: Secondary | ICD-10-CM | POA: Diagnosis not present

## 2017-03-21 DIAGNOSIS — J449 Chronic obstructive pulmonary disease, unspecified: Secondary | ICD-10-CM | POA: Diagnosis not present

## 2017-03-21 DIAGNOSIS — Z7952 Long term (current) use of systemic steroids: Secondary | ICD-10-CM | POA: Diagnosis not present

## 2017-03-21 DIAGNOSIS — I10 Essential (primary) hypertension: Secondary | ICD-10-CM | POA: Diagnosis not present

## 2017-03-21 DIAGNOSIS — Z792 Long term (current) use of antibiotics: Secondary | ICD-10-CM | POA: Diagnosis not present

## 2017-03-21 DIAGNOSIS — Z905 Acquired absence of kidney: Secondary | ICD-10-CM | POA: Diagnosis not present

## 2017-03-21 DIAGNOSIS — Z940 Kidney transplant status: Secondary | ICD-10-CM | POA: Diagnosis not present

## 2017-03-21 DIAGNOSIS — R000 Tachycardia, unspecified: Secondary | ICD-10-CM | POA: Diagnosis not present

## 2017-03-21 DIAGNOSIS — T8611 Kidney transplant rejection: Secondary | ICD-10-CM | POA: Diagnosis not present

## 2017-03-21 DIAGNOSIS — Z79899 Other long term (current) drug therapy: Secondary | ICD-10-CM | POA: Diagnosis not present

## 2017-03-21 DIAGNOSIS — N186 End stage renal disease: Secondary | ICD-10-CM | POA: Diagnosis not present

## 2017-03-31 DIAGNOSIS — Z4822 Encounter for aftercare following kidney transplant: Secondary | ICD-10-CM | POA: Diagnosis not present

## 2017-03-31 DIAGNOSIS — Z87891 Personal history of nicotine dependence: Secondary | ICD-10-CM | POA: Diagnosis not present

## 2017-03-31 DIAGNOSIS — Z79899 Other long term (current) drug therapy: Secondary | ICD-10-CM | POA: Diagnosis not present

## 2017-03-31 DIAGNOSIS — I10 Essential (primary) hypertension: Secondary | ICD-10-CM | POA: Diagnosis not present

## 2017-03-31 DIAGNOSIS — Z940 Kidney transplant status: Secondary | ICD-10-CM | POA: Diagnosis not present

## 2017-03-31 DIAGNOSIS — Z7952 Long term (current) use of systemic steroids: Secondary | ICD-10-CM | POA: Diagnosis not present

## 2017-03-31 DIAGNOSIS — Z792 Long term (current) use of antibiotics: Secondary | ICD-10-CM | POA: Diagnosis not present

## 2017-03-31 DIAGNOSIS — M542 Cervicalgia: Secondary | ICD-10-CM | POA: Diagnosis not present

## 2017-04-07 DIAGNOSIS — D8989 Other specified disorders involving the immune mechanism, not elsewhere classified: Secondary | ICD-10-CM | POA: Diagnosis not present

## 2017-04-07 DIAGNOSIS — Z79899 Other long term (current) drug therapy: Secondary | ICD-10-CM | POA: Diagnosis not present

## 2017-04-07 DIAGNOSIS — J449 Chronic obstructive pulmonary disease, unspecified: Secondary | ICD-10-CM | POA: Diagnosis not present

## 2017-04-07 DIAGNOSIS — Z7952 Long term (current) use of systemic steroids: Secondary | ICD-10-CM | POA: Diagnosis not present

## 2017-04-07 DIAGNOSIS — R000 Tachycardia, unspecified: Secondary | ICD-10-CM | POA: Diagnosis not present

## 2017-04-07 DIAGNOSIS — T8619 Other complication of kidney transplant: Secondary | ICD-10-CM | POA: Diagnosis not present

## 2017-04-07 DIAGNOSIS — N12 Tubulo-interstitial nephritis, not specified as acute or chronic: Secondary | ICD-10-CM | POA: Diagnosis not present

## 2017-04-07 DIAGNOSIS — I10 Essential (primary) hypertension: Secondary | ICD-10-CM | POA: Diagnosis not present

## 2017-04-07 DIAGNOSIS — Z4822 Encounter for aftercare following kidney transplant: Secondary | ICD-10-CM | POA: Diagnosis not present

## 2017-04-07 DIAGNOSIS — D649 Anemia, unspecified: Secondary | ICD-10-CM | POA: Diagnosis not present

## 2017-04-07 DIAGNOSIS — Z940 Kidney transplant status: Secondary | ICD-10-CM | POA: Diagnosis not present

## 2017-04-07 DIAGNOSIS — Z87891 Personal history of nicotine dependence: Secondary | ICD-10-CM | POA: Diagnosis not present

## 2017-04-07 DIAGNOSIS — E872 Acidosis: Secondary | ICD-10-CM | POA: Diagnosis not present

## 2017-04-07 DIAGNOSIS — Z792 Long term (current) use of antibiotics: Secondary | ICD-10-CM | POA: Diagnosis not present

## 2017-04-14 DIAGNOSIS — D8989 Other specified disorders involving the immune mechanism, not elsewhere classified: Secondary | ICD-10-CM | POA: Diagnosis not present

## 2017-04-14 DIAGNOSIS — Z9889 Other specified postprocedural states: Secondary | ICD-10-CM | POA: Diagnosis not present

## 2017-04-14 DIAGNOSIS — Z7952 Long term (current) use of systemic steroids: Secondary | ICD-10-CM | POA: Diagnosis not present

## 2017-04-14 DIAGNOSIS — E872 Acidosis: Secondary | ICD-10-CM | POA: Diagnosis not present

## 2017-04-14 DIAGNOSIS — D649 Anemia, unspecified: Secondary | ICD-10-CM | POA: Diagnosis not present

## 2017-04-14 DIAGNOSIS — J449 Chronic obstructive pulmonary disease, unspecified: Secondary | ICD-10-CM | POA: Diagnosis not present

## 2017-04-14 DIAGNOSIS — Z940 Kidney transplant status: Secondary | ICD-10-CM | POA: Diagnosis not present

## 2017-04-14 DIAGNOSIS — Z885 Allergy status to narcotic agent status: Secondary | ICD-10-CM | POA: Diagnosis not present

## 2017-04-14 DIAGNOSIS — Z4822 Encounter for aftercare following kidney transplant: Secondary | ICD-10-CM | POA: Diagnosis not present

## 2017-04-14 DIAGNOSIS — Z888 Allergy status to other drugs, medicaments and biological substances status: Secondary | ICD-10-CM | POA: Diagnosis not present

## 2017-04-14 DIAGNOSIS — Z87891 Personal history of nicotine dependence: Secondary | ICD-10-CM | POA: Diagnosis not present

## 2017-04-14 DIAGNOSIS — R000 Tachycardia, unspecified: Secondary | ICD-10-CM | POA: Diagnosis not present

## 2017-04-14 DIAGNOSIS — Z880 Allergy status to penicillin: Secondary | ICD-10-CM | POA: Diagnosis not present

## 2017-04-14 DIAGNOSIS — I10 Essential (primary) hypertension: Secondary | ICD-10-CM | POA: Diagnosis not present

## 2017-04-14 DIAGNOSIS — T8619 Other complication of kidney transplant: Secondary | ICD-10-CM | POA: Diagnosis not present

## 2017-04-14 DIAGNOSIS — E876 Hypokalemia: Secondary | ICD-10-CM | POA: Diagnosis not present

## 2017-04-14 DIAGNOSIS — M5412 Radiculopathy, cervical region: Secondary | ICD-10-CM | POA: Diagnosis not present

## 2017-04-14 DIAGNOSIS — Z79899 Other long term (current) drug therapy: Secondary | ICD-10-CM | POA: Diagnosis not present

## 2017-04-19 DIAGNOSIS — Z87891 Personal history of nicotine dependence: Secondary | ICD-10-CM | POA: Diagnosis not present

## 2017-04-19 DIAGNOSIS — Z7952 Long term (current) use of systemic steroids: Secondary | ICD-10-CM | POA: Diagnosis not present

## 2017-04-19 DIAGNOSIS — Z4822 Encounter for aftercare following kidney transplant: Secondary | ICD-10-CM | POA: Diagnosis not present

## 2017-04-19 DIAGNOSIS — D899 Disorder involving the immune mechanism, unspecified: Secondary | ICD-10-CM | POA: Diagnosis not present

## 2017-04-19 DIAGNOSIS — M542 Cervicalgia: Secondary | ICD-10-CM | POA: Diagnosis not present

## 2017-04-19 DIAGNOSIS — D649 Anemia, unspecified: Secondary | ICD-10-CM | POA: Diagnosis not present

## 2017-04-19 DIAGNOSIS — N269 Renal sclerosis, unspecified: Secondary | ICD-10-CM | POA: Diagnosis not present

## 2017-04-19 DIAGNOSIS — R000 Tachycardia, unspecified: Secondary | ICD-10-CM | POA: Diagnosis not present

## 2017-04-19 DIAGNOSIS — J449 Chronic obstructive pulmonary disease, unspecified: Secondary | ICD-10-CM | POA: Diagnosis not present

## 2017-04-19 DIAGNOSIS — E872 Acidosis: Secondary | ICD-10-CM | POA: Diagnosis not present

## 2017-04-19 DIAGNOSIS — I10 Essential (primary) hypertension: Secondary | ICD-10-CM | POA: Diagnosis not present

## 2017-04-19 DIAGNOSIS — T8691 Unspecified transplanted organ and tissue rejection: Secondary | ICD-10-CM | POA: Diagnosis not present

## 2017-04-19 DIAGNOSIS — Z940 Kidney transplant status: Secondary | ICD-10-CM | POA: Diagnosis not present

## 2017-04-19 DIAGNOSIS — T8611 Kidney transplant rejection: Secondary | ICD-10-CM | POA: Diagnosis not present

## 2017-04-19 DIAGNOSIS — Z792 Long term (current) use of antibiotics: Secondary | ICD-10-CM | POA: Diagnosis not present

## 2017-04-19 DIAGNOSIS — Z5181 Encounter for therapeutic drug level monitoring: Secondary | ICD-10-CM | POA: Diagnosis not present

## 2017-04-19 DIAGNOSIS — Z79899 Other long term (current) drug therapy: Secondary | ICD-10-CM | POA: Diagnosis not present

## 2017-04-19 DIAGNOSIS — R760 Raised antibody titer: Secondary | ICD-10-CM | POA: Diagnosis not present

## 2017-04-19 DIAGNOSIS — I151 Hypertension secondary to other renal disorders: Secondary | ICD-10-CM | POA: Diagnosis not present

## 2017-04-19 DIAGNOSIS — G629 Polyneuropathy, unspecified: Secondary | ICD-10-CM | POA: Diagnosis not present

## 2017-04-20 DIAGNOSIS — Z4822 Encounter for aftercare following kidney transplant: Secondary | ICD-10-CM | POA: Diagnosis not present

## 2017-04-20 DIAGNOSIS — Z79899 Other long term (current) drug therapy: Secondary | ICD-10-CM | POA: Diagnosis not present

## 2017-04-25 DIAGNOSIS — M4802 Spinal stenosis, cervical region: Secondary | ICD-10-CM | POA: Diagnosis not present

## 2017-04-25 DIAGNOSIS — R030 Elevated blood-pressure reading, without diagnosis of hypertension: Secondary | ICD-10-CM | POA: Diagnosis not present

## 2017-04-26 ENCOUNTER — Other Ambulatory Visit: Payer: Self-pay | Admitting: Neurosurgery

## 2017-04-26 DIAGNOSIS — M4802 Spinal stenosis, cervical region: Secondary | ICD-10-CM

## 2017-04-29 ENCOUNTER — Ambulatory Visit
Admission: RE | Admit: 2017-04-29 | Discharge: 2017-04-29 | Disposition: A | Discharge status: Home / Self Care | Payer: Medicare Other | Source: Ambulatory Visit | Attending: Neurosurgery | Admitting: Neurosurgery

## 2017-04-29 DIAGNOSIS — M4802 Spinal stenosis, cervical region: Secondary | ICD-10-CM

## 2017-05-03 ENCOUNTER — Other Ambulatory Visit: Payer: Self-pay | Admitting: Neurosurgery

## 2017-05-03 DIAGNOSIS — Z4822 Encounter for aftercare following kidney transplant: Secondary | ICD-10-CM | POA: Diagnosis not present

## 2017-05-03 DIAGNOSIS — M5412 Radiculopathy, cervical region: Secondary | ICD-10-CM | POA: Diagnosis not present

## 2017-05-03 DIAGNOSIS — E876 Hypokalemia: Secondary | ICD-10-CM | POA: Diagnosis not present

## 2017-05-03 DIAGNOSIS — I10 Essential (primary) hypertension: Secondary | ICD-10-CM | POA: Diagnosis not present

## 2017-05-03 DIAGNOSIS — Z79899 Other long term (current) drug therapy: Secondary | ICD-10-CM | POA: Diagnosis not present

## 2017-05-03 DIAGNOSIS — D8989 Other specified disorders involving the immune mechanism, not elsewhere classified: Secondary | ICD-10-CM | POA: Diagnosis not present

## 2017-05-03 DIAGNOSIS — J449 Chronic obstructive pulmonary disease, unspecified: Secondary | ICD-10-CM | POA: Diagnosis not present

## 2017-05-03 DIAGNOSIS — Z940 Kidney transplant status: Secondary | ICD-10-CM | POA: Diagnosis not present

## 2017-05-03 DIAGNOSIS — D649 Anemia, unspecified: Secondary | ICD-10-CM | POA: Diagnosis not present

## 2017-05-03 DIAGNOSIS — Z7952 Long term (current) use of systemic steroids: Secondary | ICD-10-CM | POA: Diagnosis not present

## 2017-05-03 DIAGNOSIS — Z792 Long term (current) use of antibiotics: Secondary | ICD-10-CM | POA: Diagnosis not present

## 2017-05-03 DIAGNOSIS — Z87891 Personal history of nicotine dependence: Secondary | ICD-10-CM | POA: Diagnosis not present

## 2017-05-03 DIAGNOSIS — M542 Cervicalgia: Secondary | ICD-10-CM | POA: Diagnosis not present

## 2017-05-03 DIAGNOSIS — M4802 Spinal stenosis, cervical region: Secondary | ICD-10-CM

## 2017-05-03 DIAGNOSIS — E872 Acidosis: Secondary | ICD-10-CM | POA: Diagnosis not present

## 2017-05-03 DIAGNOSIS — E875 Hyperkalemia: Secondary | ICD-10-CM | POA: Diagnosis not present

## 2017-05-03 DIAGNOSIS — M5410 Radiculopathy, site unspecified: Secondary | ICD-10-CM | POA: Diagnosis not present

## 2017-05-03 DIAGNOSIS — R000 Tachycardia, unspecified: Secondary | ICD-10-CM | POA: Diagnosis not present

## 2017-05-09 ENCOUNTER — Ambulatory Visit (INDEPENDENT_AMBULATORY_CARE_PROVIDER_SITE_OTHER): Payer: Medicare Other

## 2017-05-09 DIAGNOSIS — M50323 Other cervical disc degeneration at C6-C7 level: Secondary | ICD-10-CM | POA: Diagnosis not present

## 2017-05-09 DIAGNOSIS — M4802 Spinal stenosis, cervical region: Secondary | ICD-10-CM | POA: Diagnosis not present

## 2017-05-09 DIAGNOSIS — M50322 Other cervical disc degeneration at C5-C6 level: Secondary | ICD-10-CM | POA: Diagnosis not present

## 2017-05-09 DIAGNOSIS — M9981 Other biomechanical lesions of cervical region: Secondary | ICD-10-CM | POA: Diagnosis not present

## 2017-05-09 DIAGNOSIS — R030 Elevated blood-pressure reading, without diagnosis of hypertension: Secondary | ICD-10-CM | POA: Diagnosis not present

## 2017-05-16 ENCOUNTER — Other Ambulatory Visit: Payer: Self-pay | Admitting: Neurosurgery

## 2017-05-17 DIAGNOSIS — T8611 Kidney transplant rejection: Secondary | ICD-10-CM | POA: Diagnosis not present

## 2017-05-17 DIAGNOSIS — I129 Hypertensive chronic kidney disease with stage 1 through stage 4 chronic kidney disease, or unspecified chronic kidney disease: Secondary | ICD-10-CM | POA: Diagnosis not present

## 2017-05-17 DIAGNOSIS — Z792 Long term (current) use of antibiotics: Secondary | ICD-10-CM | POA: Diagnosis not present

## 2017-05-17 DIAGNOSIS — K859 Acute pancreatitis without necrosis or infection, unspecified: Secondary | ICD-10-CM | POA: Diagnosis not present

## 2017-05-17 DIAGNOSIS — Z5181 Encounter for therapeutic drug level monitoring: Secondary | ICD-10-CM | POA: Diagnosis not present

## 2017-05-17 DIAGNOSIS — M542 Cervicalgia: Secondary | ICD-10-CM | POA: Diagnosis not present

## 2017-05-17 DIAGNOSIS — G629 Polyneuropathy, unspecified: Secondary | ICD-10-CM | POA: Diagnosis not present

## 2017-05-17 DIAGNOSIS — E875 Hyperkalemia: Secondary | ICD-10-CM | POA: Diagnosis not present

## 2017-05-17 DIAGNOSIS — D649 Anemia, unspecified: Secondary | ICD-10-CM | POA: Diagnosis not present

## 2017-05-17 DIAGNOSIS — Z7952 Long term (current) use of systemic steroids: Secondary | ICD-10-CM | POA: Diagnosis not present

## 2017-05-17 DIAGNOSIS — E872 Acidosis: Secondary | ICD-10-CM | POA: Diagnosis not present

## 2017-05-17 DIAGNOSIS — N183 Chronic kidney disease, stage 3 (moderate): Secondary | ICD-10-CM | POA: Diagnosis not present

## 2017-05-17 DIAGNOSIS — Z87891 Personal history of nicotine dependence: Secondary | ICD-10-CM | POA: Diagnosis not present

## 2017-05-17 DIAGNOSIS — J449 Chronic obstructive pulmonary disease, unspecified: Secondary | ICD-10-CM | POA: Diagnosis not present

## 2017-05-17 DIAGNOSIS — D631 Anemia in chronic kidney disease: Secondary | ICD-10-CM | POA: Diagnosis not present

## 2017-05-17 DIAGNOSIS — Z79899 Other long term (current) drug therapy: Secondary | ICD-10-CM | POA: Diagnosis not present

## 2017-05-17 DIAGNOSIS — Z940 Kidney transplant status: Secondary | ICD-10-CM | POA: Diagnosis not present

## 2017-05-17 DIAGNOSIS — T8619 Other complication of kidney transplant: Secondary | ICD-10-CM | POA: Diagnosis not present

## 2017-05-17 DIAGNOSIS — D899 Disorder involving the immune mechanism, unspecified: Secondary | ICD-10-CM | POA: Diagnosis not present

## 2017-05-17 DIAGNOSIS — R000 Tachycardia, unspecified: Secondary | ICD-10-CM | POA: Diagnosis not present

## 2017-05-17 DIAGNOSIS — Z4822 Encounter for aftercare following kidney transplant: Secondary | ICD-10-CM | POA: Diagnosis not present

## 2017-05-17 DIAGNOSIS — I10 Essential (primary) hypertension: Secondary | ICD-10-CM | POA: Diagnosis not present

## 2017-05-19 NOTE — Pre-Procedure Instructions (Signed)
[name]  05/19/2017      [id number] [address], [place] [address] Phone: [phone] Fax: [phone]    Your procedure is scheduled on 05/24/2017.  Report to [facility] Admitting at 1:30 P.M.  Call this number if you have problems the morning of surgery:  [phone]   Remember:  Do not eat food or drink liquids after midnight.   Continue all medications as directed by your physician except follow these medication instructions before surgery below   Take these medicines the morning of surgery with A SIP OF WATER: Acetaminophen (Tylenol) - if needed Alprazolam (Xanax) Gabapentin (Neurontin) Mycophenolate (Myfortic) predisone (Deltasone) Sulfamethoxazole-trimethoprim (bactrim, septra) Tacrolimus (Prograf)  7 days prior to surgery STOP taking any Aspirin (unless otherwise instructed by your surgeon), Aleve, Naproxen, Ibuprofen, Motrin, Advil, Goody's, BC's, all herbal medications, fish oil, and all vitamins     Do not wear jewelry, make-up or nail polish.  Do not wear lotions, powders, or perfumes, or deodorant.  Do not shave 48 hours prior to surgery.    Do not bring valuables to the hospital.  [facility] is not responsible for any belongings or valuables.  Hearing aids, eyeglasses, contacts, dentures or bridgework may not be worn into surgery.  Leave your suitcase in the car.  After surgery it may be brought to your room.  For patients admitted to the hospital, discharge time will be determined by your treatment team.  Patients discharged the day of surgery will not be allowed to drive home.   Name and phone number of your driver:    Special instructions:   [facility]- Preparing For Surgery  Before surgery, you can play an important role. Because skin is not sterile, your skin needs to be as free of germs as possible. You can reduce the number of germs on your skin by washing with  CHG (chlorahexidine gluconate) Soap before surgery.  CHG is an antiseptic cleaner which kills germs and bonds with the skin to continue killing germs even after washing.  Please do not use if you have an allergy to CHG or antibacterial soaps. If your skin becomes reddened/irritated stop using the CHG.  Do not shave (including legs and underarms) for at least 48 hours prior to first CHG shower. It is OK to shave your face.  Please follow these instructions carefully.   1. Shower the NIGHT BEFORE SURGERY and the MORNING OF SURGERY with CHG.   2. If you chose to wash your hair, wash your hair first as usual with your normal shampoo.  3. After you shampoo, rinse your hair and body thoroughly to remove the shampoo.  4. Use CHG as you would any other liquid soap. You can apply CHG directly to the skin and wash gently with a scrungie or a clean washcloth.   5. Apply the CHG Soap to your body ONLY FROM THE NECK DOWN.  Do not use on open wounds or open sores. Avoid contact with your eyes, ears, mouth and genitals (private parts). Wash Face and genitals (private parts)  with your normal soap.  6. Wash thoroughly, paying special attention to the area where your surgery will be performed.  7. Thoroughly rinse your body with warm water from the neck down.  8. DO NOT shower/wash with your normal soap after using and rinsing off the CHG Soap.  9. Pat yourself dry with a CLEAN TOWEL.  10. Wear  CLEAN PAJAMAS to bed the night before surgery, wear comfortable clothes the morning of surgery  11. Place CLEAN SHEETS on your bed the night of your first shower and DO NOT SLEEP WITH PETS.    Day of Surgery: Shower as stated above. Do not apply any deodorants/lotions. Please wear clean clothes to the hospital/surgery center.      Please read over the following fact sheets that you were given.

## 2017-05-22 ENCOUNTER — Encounter (HOSPITAL_COMMUNITY)
Admission: RE | Admit: 2017-05-22 | Discharge: 2017-05-22 | Disposition: A | Discharge status: Home / Self Care | Payer: Medicare Other | Source: Ambulatory Visit | Attending: Neurosurgery | Admitting: Neurosurgery

## 2017-05-22 ENCOUNTER — Encounter (HOSPITAL_COMMUNITY): Payer: Self-pay

## 2017-05-22 HISTORY — DX: Kidney transplant status: Z94.0

## 2017-05-22 LAB — BASIC METABOLIC PANEL
ANION GAP: 10 (ref 5–15)
BUN: 21 mg/dL — ABNORMAL HIGH (ref 6–20)
CHLORIDE: 105 mmol/L (ref 101–111)
CO2: 18 mmol/L — ABNORMAL LOW (ref 22–32)
Calcium: 9.9 mg/dL (ref 8.9–10.3)
Creatinine, Ser: 1.07 mg/dL — ABNORMAL HIGH (ref 0.44–1.00)
GFR calc Af Amer: >60 mL/min (ref >60)
GFR, EST NON AFRICAN AMERICAN: 57 mL/min — AB (ref >60)
Glucose, Bld: 103 mg/dL — ABNORMAL HIGH (ref 65–99)
Potassium: 4.2 mmol/L (ref 3.5–5.1)
SODIUM: 133 mmol/L — AB (ref 135–145)

## 2017-05-22 LAB — CBC
HCT: 36.4 % (ref 36.0–46.0)
HEMOGLOBIN: 11.1 g/dL — AB (ref 12.0–15.0)
MCH: 28.6 pg (ref 26.0–34.0)
MCHC: 30.5 g/dL (ref 30.0–36.0)
MCV: 93.8 fL (ref 78.0–100.0)
Platelets: 235 10*3/uL (ref 150–400)
RBC: 3.88 MIL/uL (ref 3.87–5.11)
RDW: 16.1 % — ABNORMAL HIGH (ref 11.5–15.5)
WBC: 8.6 10*3/uL (ref 4.0–10.5)

## 2017-05-22 LAB — SURGICAL PCR SCREEN
MRSA, PCR: NEGATIVE
STAPHYLOCOCCUS AUREUS: NEGATIVE

## 2017-05-22 LAB — ABO/RH: ABO/RH(D): O POS

## 2017-05-22 LAB — TYPE AND SCREEN
ABO/RH(D): O POS
ANTIBODY SCREEN: NEGATIVE

## 2017-05-22 NOTE — Progress Notes (Signed)
PCP -Pricilla Holm Cardiologist - denies  Chest x-ray - not needed EKG - 02/20/17 requesting tracing Stress Test - 2016 ECHO - 2418 Cardiac Cath - denies   Aspirin Instructions: last dose 05/22/17  Anesthesia review: yes recent kidney transplant at [place] 01/21/17  Patient denies shortness of breath, fever, cough and chest pain at PAT appointment   Patient verbalized understanding of instructions that were given to them at the PAT appointment. Patient was also instructed that they will need to review over the PAT instructions again at home before surgery.

## 2017-05-23 NOTE — Progress Notes (Signed)
Anesthesia Chart Review:  Case:  [id number] Date/Time:  05/24/17 1435   Procedure:  CORPECTOMY CERVICAL 5- CRVICAL 6, POSSIBLE CERVICAL 7, ANTERIOR STRUT GRAFT (N/A ) - CORPECTOMY CERVICAL 5- CRVICAL 6, POSSIBLE CERVICAL 7, ANTERIOR STRUT GRAFT   Anesthesia type:  General   Pre-op diagnosis:  CERVICAL STENOSIS OF SPINAL CANAL   Location:  [facility] OR ROOM 21 / [place] [facility]   Surgeon:  Ashok Pall, MD      DISCUSSION: Patient is a 57 year old female scheduled for the above procedure. She is s/p C3-7 posterior cervical laminectomy on 2016/09/04 and s/p deceased donor kidney transplant on 01/21/17. 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 According to notes in [place] patch monitor was ordered 02/20/17 for tachycardia (HR 110's). I can't access the full report in Care Everywhere, but according to 03/10/17 results showed some SVT, but patient's symptoms had improved so no referral was initiated at that time unless symptoms didn't resolve. HR was documented at 88 bpm at 05/23/17 PAT visit.   Her transplant team is aware of surgery plans. Based on currently available information, I anticipate that she can proceed as planned if no acute changes.    VS: BP (!) 122/59   Pulse 88   Temp (!) 36.4 C   Resp 20   Ht 5\' 7"  (1.702 m)   Wt 135 lb 11.2 oz (61.6 kg)   SpO2 100%   BMI 21.25 kg/m   PROVIDERS: Hoyt Koch, MD is PCP - Nephrologist has been Donato Heinz, MD at [facility]. She is also followed at the [facility], last visit was on 05/17/17 with Reeves-Daniel, Amber, DO. Provider was aware of surgery plans.  - She  does not routinely see a cardiologist, but saw Dr. Johnsie Cancel in 2015 for tachycardia felt due to flow rate during dialysis.    LABS: Labs reviewed: Acceptable for surgery. (all labs ordered are listed, but only abnormal results are displayed)  Labs Reviewed  BASIC METABOLIC PANEL - Abnormal; Notable for the following components:      Result Value   Sodium 133 (*)    CO2 18 (*)    Glucose, Bld 103 (*)    BUN 21 (*)    Creatinine, Ser 1.07 (*)    GFR calc non Af Amer 57 (*)    All other components within normal limits  CBC - Abnormal; Notable for the following components:   Hemoglobin 11.1 (*)    RDW 16.1 (*)    All other components within normal limits  SURGICAL PCR SCREEN  TYPE AND SCREEN  ABO/RH     IMAGES: PCXR 01/21/17 ([place]; done for pre-transplant evaluation): Result Impression: Mild interstitial edema.  Xray C-spine 05/09/17: IMPRESSION: C6 vertebral body collapse is noted with probable mild retrolisthesis of C6 on C7 as described on prior MRI. Severe degenerative changes are noted involving the C5-6 and C6-7 disc spaces.  MRI C-spine 04/29/17: IMPRESSION: 1. Appearance suspicious for collapse of the C6 vertebral body since July. Associated bony retropulsion and mild subluxation of the bilateral C6-C7 facets. Subsequently, spinal stenosis at C6-C7 persists despite posterior decompression, and there is increased moderate to severe bilateral foraminal stenosis. Query bilateral C7 radiculitis.  Follow-up noncontrast Cervical Spine CT may be valuable to confirm the status of C6, and for treatment planning. 2. Resolved cervical spinal stenosis C3-C4 through C5-C6 following posterior decompression. 3. Stable to mildly improved spinal stenosis at C2-C3 with no spinal cord mass effect. ADDENDUM Gaspar Cola, MD): Salient findings were discussed by telephone with Nurse Butch Penny Apple in the office of Dr. Marylyn Ishihara CABBELL on 05/01/2017 at 0948 hours. We discussed my  suspicion of C6 vertebral body collapse since 2018 resulting in spinal and C7 nerve level foraminal stenosis. I recommended follow-up noncontrast Cervical Spine CT. Furthermore, on review of the study today I also note edema in the longus coli muscles ventral to the C6 body. This is presumably reactive, but considering the history of end-stage renal disease, I advised a low threshold for consideration of spinal infection.  OTHER:  PFTs 01/13/17 ([place]): FVC Pre-Actual 2.23 L  FVC %Pred-Pre 72.2 %  FVC LLN 2.29 L  FEV1 Pre-Actual 1.74 L  FEV1 %Pred-Pre 70.9 %  FEV1 LLN 1.83 L  FEV1FVC Pre-Actual 78 %  FEV1FVC LLN 70 %  SVC Pre-Actual 2.21 L  SVC %Pred-Pre 64.7 %  RVPLETH %Pred-Pre 98.6 %  TLCPLETH Pre-Actual 4.24 L  TLCPLETH %Pred-Pre 77.4 %  TLCPLETH LLN 4.39 L  DLCOUNC Pre-Actual 20.62 ml/min/mmHg  DLCOUNC %Pred-Pre 91.6 %  DLCOUNC LLN 17.24 ml/min/mmHg  DLVA %Pred-Pre 118.7 %  FEV1SVC Pre-Actual 79 %    EKG: 02/20/17 [facility]): SR, early repolarization.  Interpreting provider did not feel that it was significantly changed from previous tracing.   CV: Zio Patch 3-14 days 03/05/17 (placed 02/24/17 per Transplant team for evaluation of symptomatic tachycardia; [facility]): Report requested. By 03/10/17 notes there was some runs of SVT, but symptoms improved so not referred to EP unless symptoms did not resolve.   Echo 01/13/17 ([place]): SUMMARY The left ventricular size is normal. There is normal left ventricular wall  thickness. Left ventricular systolic function is normal. LV ejection fraction = 55-60%. Left ventricular filling pattern is prolonged relaxation. The right ventricle is normal in size and function. There is mild mitral annular calcification. The mitral valve leaflets appear  thickened, but open well. There is mild mitral regurgitation. Structurally normal tricuspid valve. There is moderate tricuspid  regurgitation. The aortic root is  normal size. IVC size was normal. There is no pericardial effusion. Probably no significant change in comparison with the prior study noted.  Carotid U/S 09/25/15 [facility]; scanned under Media tab, Correspondence 08/12/16): Minimal plaque formation of bilateral bulbs. Velocities within normal limites. Bilateral 20-39% diameter reduction, no significant BICA stenosis. Bilateral vertebral artery flow is antegrade. Has RUE AVF.    Nuclear stress test 11/24/14 [facility]; Care Everywhere): No inducible ischemia. Normal left ventricular function. EF > 70%.  Stress echo 10/29/14 [facility]; Care Everywhere):  There was normal increase in global LV function post exercise. Normal left  ventricular function and global wall motion with stress. The estimated LV  ejection fraction is >70% with stress. Nondiagnostic exercise  echocardiography due to subtarget heart rate.   Past Medical History:  Diagnosis Date  . Anemia   . Anxiety   . Arthritis   . Claustrophobia   . ESRD (end stage renal disease) on dialysis ([place])    "TTS; [place]" (03/24/2015)  . Glomerulonephritis   . Heart murmur   . HTN (hypertension)   . Hypothyroidism   . Kidney transplant recipient 01/21/2017  . Pancreatitis   . Renal insufficiency   .  Secondary hyperparathyroidism ([place])    [name] 05/11/2014    Past Surgical History:  Procedure Laterality Date  . ARTERIOVENOUS GRAFT PLACEMENT Right 1990's?  . ARTERIOVENOUS GRAFT PLACEMENT Left 07/2002   upper arm/notes 06/02/2010  . ARTERIOVENOUS GRAFT PLACEMENT Right 07/2003   upper arm/notes 06/02/2010  . AV FISTULA PLACEMENT Left 09/2000   upper arm/notes 06/02/2010  . BREAST BIOPSY Left unsure   benign  . COLONOSCOPY    . DILATATION & CURRETTAGE/HYSTEROSCOPY WITH RESECTOCOPE N/A 04/17/2013   Procedure: DILATATION & CURETTAGE/HYSTEROSCOPY WITH RESECTOCOPE;  Surgeon: Marvene Staff, MD;  Location: [facility];  Service: Gynecology;  Laterality: N/A;  . DILATION AND  CURETTAGE OF UTERUS    . EYE SURGERY    . KIDNEY TRANSPLANT  1997  . KIDNEY TRANSPLANT  01/21/2017   Dr. Harrington Challenger  . PARATHYROIDECTOMY  03/2000 05/12/2014   w/neck exploration & autotransplantation/notes 06/02/2010; w/neck exploration  . PARATHYROIDECTOMY N/A 05/12/2014   Procedure: PARATHYROIDECTOMY AND NECK EXPLORATION;  Surgeon: Armandina Gemma, MD;  Location: [place];  Service: General;  Laterality: N/A;  . PERITONEAL CATHETER INSERTION    . PERITONEAL CATHETER REMOVAL  08/1999   [name] 06/02/2010  . POSTERIOR CERVICAL LAMINECTOMY N/A 08/12/2016   Procedure: POSTERIOR CERVICAL LAMINECTOMY CERVICAL THREE CERVICAL FOUR, CERVICAL FOUR CERVICAL FIVE, CERVICAL FIVE- CERVICAL SIX, CERVICAL SIX- CERVICAL SEVEN;  Surgeon: Ashok Pall, MD;  Location: [place];  Service: Neurosurgery;  Laterality: N/A;  POSTERIOR  . RETINAL DETACHMENT SURGERY Left   . THROMBECTOMY Left 02/2002   fistula/notes 06/02/2010  . THROMBECTOMY / ARTERIOVENOUS GRAFT REVISION  11/2003; 01/2004; 08/07/2005; 08/10/2005; 10/2005   [name] 06/02/2010  . THROMBECTOMY AND REVISION OF ARTERIOVENTOUS (AV) GORETEX  GRAFT Right 01/2002; 11/2003; 01/2004; 08/07/2004; 08/10/2004; 11/13/2005   [name] 5/1/2012Marland Kitchen [name] 5/16/2012Marland Kitchen [name] 5/16/2012Marland Kitchen [name] 5/16/2012Marland Kitchen [name] 06/02/2010; [name] 06/02/2010  . THROMBECTOMY AND REVISION OF ARTERIOVENTOUS (AV) GORETEX  GRAFT Left 08/23/2002; 09/13/2002; 07/08/2003   [name] 06/02/2010; [name] 06/02/2010; [name] 06/02/2010    MEDICATIONS: . acetaminophen (TYLENOL) 500 MG tablet  . ALPRAZolam (XANAX) 0.25 MG tablet  . aspirin EC 81 MG tablet  . folic acid-vitamin b complex-vitamin c-selenium-zinc (DIALYVITE) 3 MG TABS tablet  . gabapentin (NEURONTIN) 300 MG capsule  . mycophenolate (MYFORTIC) 180 MG EC tablet  . phosphorus (K PHOS NEUTRAL) 155-852-130 MG tablet  . predniSONE (DELTASONE) 2.5 MG tablet  . predniSONE (DELTASONE) 5 MG tablet  . ranitidine (ZANTAC) 150 MG tablet  . sodium bicarbonate 650 MG tablet  . Specialty  Vitamins Products (MG-PLUS PROTEIN PO)  . sulfamethoxazole-trimethoprim (BACTRIM,SEPTRA) 400-80 MG tablet  . tacrolimus (PROGRAF) 0.5 MG capsule  . tacrolimus (PROGRAF) 1 MG capsule   No current facility-administered medications for this encounter.   Last ASA 05/22/17.  George Hugh [facility] Short Stay Center/Anesthesiology Phone [phone] 05/23/2017 3:02 PM

## 2017-05-24 ENCOUNTER — Inpatient Hospital Stay (HOSPITAL_COMMUNITY): Payer: Medicare Other

## 2017-05-24 ENCOUNTER — Inpatient Hospital Stay (HOSPITAL_COMMUNITY): Payer: Medicare Other | Admitting: Certified Registered Nurse Anesthetist

## 2017-05-24 ENCOUNTER — Inpatient Hospital Stay (HOSPITAL_COMMUNITY)
Admission: RE | Admit: 2017-05-24 | Discharge: 2017-06-02 | DRG: 454 | Disposition: A | Discharge status: Home / Self Care | Payer: Medicare Other | Source: Ambulatory Visit | Attending: Neurosurgery | Admitting: Neurosurgery

## 2017-05-24 ENCOUNTER — Inpatient Hospital Stay (HOSPITAL_COMMUNITY): Payer: Medicare Other | Admitting: Emergency Medicine

## 2017-05-24 ENCOUNTER — Encounter (HOSPITAL_COMMUNITY): Payer: Self-pay | Admitting: Urology

## 2017-05-24 ENCOUNTER — Inpatient Hospital Stay (HOSPITAL_COMMUNITY)
Admission: RE | Disposition: A | Discharge status: Home / Self Care | Payer: Self-pay | Source: Ambulatory Visit | Attending: Neurosurgery

## 2017-05-24 DIAGNOSIS — I10 Essential (primary) hypertension: Secondary | ICD-10-CM | POA: Diagnosis present

## 2017-05-24 DIAGNOSIS — M50022 Cervical disc disorder at C5-C6 level with myelopathy: Secondary | ICD-10-CM | POA: Diagnosis present

## 2017-05-24 DIAGNOSIS — Z940 Kidney transplant status: Secondary | ICD-10-CM | POA: Diagnosis not present

## 2017-05-24 DIAGNOSIS — Z419 Encounter for procedure for purposes other than remedying health state, unspecified: Secondary | ICD-10-CM

## 2017-05-24 DIAGNOSIS — M40202 Unspecified kyphosis, cervical region: Secondary | ICD-10-CM | POA: Diagnosis not present

## 2017-05-24 DIAGNOSIS — M47812 Spondylosis without myelopathy or radiculopathy, cervical region: Secondary | ICD-10-CM | POA: Diagnosis not present

## 2017-05-24 DIAGNOSIS — I120 Hypertensive chronic kidney disease with stage 5 chronic kidney disease or end stage renal disease: Secondary | ICD-10-CM | POA: Diagnosis not present

## 2017-05-24 DIAGNOSIS — M4322 Fusion of spine, cervical region: Secondary | ICD-10-CM | POA: Diagnosis not present

## 2017-05-24 DIAGNOSIS — Z9889 Other specified postprocedural states: Secondary | ICD-10-CM

## 2017-05-24 DIAGNOSIS — N250 Renal osteodystrophy: Secondary | ICD-10-CM | POA: Diagnosis present

## 2017-05-24 DIAGNOSIS — M4802 Spinal stenosis, cervical region: Principal | ICD-10-CM | POA: Diagnosis present

## 2017-05-24 DIAGNOSIS — M4012 Other secondary kyphosis, cervical region: Secondary | ICD-10-CM

## 2017-05-24 DIAGNOSIS — E039 Hypothyroidism, unspecified: Secondary | ICD-10-CM | POA: Diagnosis present

## 2017-05-24 DIAGNOSIS — Z87891 Personal history of nicotine dependence: Secondary | ICD-10-CM | POA: Diagnosis not present

## 2017-05-24 DIAGNOSIS — N186 End stage renal disease: Secondary | ICD-10-CM | POA: Diagnosis not present

## 2017-05-24 DIAGNOSIS — Z981 Arthrodesis status: Secondary | ICD-10-CM | POA: Diagnosis not present

## 2017-05-24 DIAGNOSIS — G9529 Other cord compression: Secondary | ICD-10-CM | POA: Diagnosis not present

## 2017-05-24 DIAGNOSIS — M542 Cervicalgia: Secondary | ICD-10-CM | POA: Diagnosis not present

## 2017-05-24 HISTORY — PX: ANTERIOR CERVICAL CORPECTOMY: SHX1159

## 2017-05-24 SURGERY — ANTERIOR CERVICAL CORPECTOMY
Anesthesia: General | Site: Neck

## 2017-05-24 MED ORDER — THROMBIN (RECOMBINANT) 5000 UNITS EX SOLR
OROMUCOSAL | Status: DC | PRN
  Administered 2017-05-24 (×2): 5 mL via TOPICAL

## 2017-05-24 MED ORDER — MAGNESIUM CITRATE PO SOLN: 1.0000 | Freq: Once | ORAL | Status: DC | PRN

## 2017-05-24 MED ORDER — ONDANSETRON HCL 4 MG PO TABS: 4.0000 mg | ORAL_TABLET | Freq: Four times a day (QID) | ORAL | Status: DC | PRN

## 2017-05-24 MED ORDER — SODIUM BICARBONATE 650 MG PO TABS
650.0000 mg | ORAL_TABLET | Freq: Two times a day (BID) | ORAL | Status: DC
  Administered 2017-05-25 – 2017-06-02 (×12): 650 mg via ORAL
  Filled 2017-05-24 (×18): qty 1

## 2017-05-24 MED ORDER — THROMBIN 5000 UNITS EX SOLR
CUTANEOUS | Status: AC
  Filled 2017-05-24: qty 5000

## 2017-05-24 MED ORDER — LIDOCAINE-EPINEPHRINE 0.5 %-1:200000 IJ SOLN
INTRAMUSCULAR | Status: DC | PRN
  Administered 2017-05-24: 2 mL

## 2017-05-24 MED ORDER — LIDOCAINE HCL (CARDIAC) PF 100 MG/5ML IV SOSY
PREFILLED_SYRINGE | INTRAVENOUS | Status: DC | PRN
  Administered 2017-05-24: 100 mg via INTRAVENOUS

## 2017-05-24 MED ORDER — DIAZEPAM 5 MG PO TABS
5.0000 mg | ORAL_TABLET | Freq: Four times a day (QID) | ORAL | Status: DC | PRN
  Administered 2017-05-31 – 2017-06-01 (×3): 5 mg via ORAL
  Filled 2017-05-24 (×3): qty 1

## 2017-05-24 MED ORDER — SODIUM CHLORIDE 0.9% FLUSH: 3.0000 mL | INTRAVENOUS | Status: DC | PRN

## 2017-05-24 MED ORDER — PHENOL 1.4 % MT LIQD: 1.0000 | OROMUCOSAL | Status: DC | PRN

## 2017-05-24 MED ORDER — PROMETHAZINE HCL 25 MG/ML IJ SOLN: 6.2500 mg | INTRAMUSCULAR | Status: DC | PRN

## 2017-05-24 MED ORDER — ONDANSETRON HCL 4 MG/2ML IJ SOLN
INTRAMUSCULAR | Status: AC
  Filled 2017-05-24: qty 2

## 2017-05-24 MED ORDER — OXYCODONE HCL ER 10 MG PO T12A
10.0000 mg | EXTENDED_RELEASE_TABLET | Freq: Two times a day (BID) | ORAL | Status: DC
  Administered 2017-05-24 – 2017-06-02 (×17): 10 mg via ORAL
  Filled 2017-05-24 (×18): qty 1

## 2017-05-24 MED ORDER — ONDANSETRON HCL 4 MG/2ML IJ SOLN
INTRAMUSCULAR | Status: DC | PRN
  Administered 2017-05-24: 4 mg via INTRAVENOUS

## 2017-05-24 MED ORDER — TACROLIMUS 1 MG PO CAPS: 2.0000 mg | ORAL_CAPSULE | Freq: Two times a day (BID) | ORAL | Status: DC

## 2017-05-24 MED ORDER — PREDNISONE 2.5 MG PO TABS: 2.5000 mg | ORAL_TABLET | Freq: Every day | ORAL | Status: DC

## 2017-05-24 MED ORDER — SULFAMETHOXAZOLE-TRIMETHOPRIM 400-80 MG PO TABS
1.0000 | ORAL_TABLET | ORAL | Status: DC
  Administered 2017-05-26 – 2017-06-02 (×4): 1 via ORAL
  Filled 2017-05-24 (×4): qty 1

## 2017-05-24 MED ORDER — MIDAZOLAM HCL 5 MG/5ML IJ SOLN
INTRAMUSCULAR | Status: DC | PRN
  Administered 2017-05-24: 1 mg via INTRAVENOUS

## 2017-05-24 MED ORDER — SODIUM CHLORIDE 0.9% FLUSH
3.0000 mL | Freq: Two times a day (BID) | INTRAVENOUS | Status: DC
  Administered 2017-05-24 – 2017-06-01 (×13): 3 mL via INTRAVENOUS

## 2017-05-24 MED ORDER — ALBUMIN HUMAN 5 % IV SOLN
INTRAVENOUS | Status: DC | PRN
  Administered 2017-05-24: 21:00:00 via INTRAVENOUS

## 2017-05-24 MED ORDER — THROMBIN 20000 UNITS EX SOLR
CUTANEOUS | Status: AC
  Filled 2017-05-24: qty 20000

## 2017-05-24 MED ORDER — FAMOTIDINE 20 MG PO TABS
20.0000 mg | ORAL_TABLET | Freq: Every day | ORAL | Status: DC
  Administered 2017-05-24 – 2017-06-01 (×8): 20 mg via ORAL
  Filled 2017-05-24 (×8): qty 1

## 2017-05-24 MED ORDER — SUGAMMADEX SODIUM 200 MG/2ML IV SOLN
INTRAVENOUS | Status: DC | PRN
  Administered 2017-05-24: 200 mg via INTRAVENOUS

## 2017-05-24 MED ORDER — PROPOFOL 10 MG/ML IV BOLUS
INTRAVENOUS | Status: AC
  Filled 2017-05-24: qty 20

## 2017-05-24 MED ORDER — FENTANYL CITRATE (PF) 100 MCG/2ML IJ SOLN
INTRAMUSCULAR | Status: DC | PRN
  Administered 2017-05-24: 50 ug via INTRAVENOUS
  Administered 2017-05-24: 100 ug via INTRAVENOUS
  Administered 2017-05-24 (×2): 50 ug via INTRAVENOUS

## 2017-05-24 MED ORDER — SENNOSIDES-DOCUSATE SODIUM 8.6-50 MG PO TABS: 1.0000 | ORAL_TABLET | Freq: Every evening | ORAL | Status: DC | PRN

## 2017-05-24 MED ORDER — FENTANYL CITRATE (PF) 100 MCG/2ML IJ SOLN: 25.0000 ug | INTRAMUSCULAR | Status: DC | PRN

## 2017-05-24 MED ORDER — ZOLPIDEM TARTRATE 5 MG PO TABS
5.0000 mg | ORAL_TABLET | Freq: Every evening | ORAL | Status: DC | PRN
  Administered 2017-05-24: 5 mg via ORAL
  Filled 2017-05-24: qty 1

## 2017-05-24 MED ORDER — PROPOFOL 10 MG/ML IV BOLUS
INTRAVENOUS | Status: DC | PRN
  Administered 2017-05-24: 30 mg via INTRAVENOUS
  Administered 2017-05-24: 100 mg via INTRAVENOUS

## 2017-05-24 MED ORDER — CHLORHEXIDINE GLUCONATE CLOTH 2 % EX PADS: 6.0000 | MEDICATED_PAD | Freq: Once | CUTANEOUS | Status: DC

## 2017-05-24 MED ORDER — VANCOMYCIN HCL IN DEXTROSE 1-5 GM/200ML-% IV SOLN
1000.0000 mg | INTRAVENOUS | Status: AC
  Administered 2017-05-24: 1000 mg via INTRAVENOUS
  Filled 2017-05-24: qty 200

## 2017-05-24 MED ORDER — ALPRAZOLAM 0.25 MG PO TABS: 0.2500 mg | ORAL_TABLET | Freq: Every day | ORAL | Status: DC | PRN

## 2017-05-24 MED ORDER — ONDANSETRON HCL 4 MG/2ML IJ SOLN
4.0000 mg | Freq: Once | INTRAMUSCULAR | Status: AC | PRN
  Administered 2017-05-24: 4 mg via INTRAVENOUS

## 2017-05-24 MED ORDER — LIDOCAINE-EPINEPHRINE 0.5 %-1:200000 IJ SOLN
INTRAMUSCULAR | Status: AC
  Filled 2017-05-24: qty 1

## 2017-05-24 MED ORDER — FENTANYL CITRATE (PF) 100 MCG/2ML IJ SOLN
25.0000 ug | INTRAMUSCULAR | Status: DC | PRN
  Administered 2017-05-24: 25 ug via INTRAVENOUS

## 2017-05-24 MED ORDER — TACROLIMUS 1 MG PO CAPS
2.5000 mg | ORAL_CAPSULE | Freq: Two times a day (BID) | ORAL | Status: DC
  Administered 2017-05-24 – 2017-06-02 (×17): 2.5 mg via ORAL
  Filled 2017-05-24 (×18): qty 1

## 2017-05-24 MED ORDER — ASPIRIN EC 81 MG PO TBEC
81.0000 mg | DELAYED_RELEASE_TABLET | Freq: Every day | ORAL | Status: DC
  Administered 2017-05-31 – 2017-06-02 (×3): 81 mg via ORAL
  Filled 2017-05-24 (×6): qty 1

## 2017-05-24 MED ORDER — DEXAMETHASONE SODIUM PHOSPHATE 10 MG/ML IJ SOLN
INTRAMUSCULAR | Status: DC | PRN
  Administered 2017-05-24: 10 mg via INTRAVENOUS

## 2017-05-24 MED ORDER — ACETAMINOPHEN 650 MG RE SUPP: 650.0000 mg | RECTAL | Status: DC | PRN

## 2017-05-24 MED ORDER — POTASSIUM CHLORIDE IN NACL 20-0.9 MEQ/L-% IV SOLN
INTRAVENOUS | Status: DC
  Administered 2017-05-24 – 2017-05-25 (×2): via INTRAVENOUS
  Filled 2017-05-24 (×3): qty 1000

## 2017-05-24 MED ORDER — MG-PLUS PROTEIN 133 MG PO TABS: 1.0000 | ORAL_TABLET | Freq: Two times a day (BID) | ORAL | Status: DC

## 2017-05-24 MED ORDER — LACTATED RINGERS IV SOLN
INTRAVENOUS | Status: DC
  Administered 2017-05-24 (×2): via INTRAVENOUS

## 2017-05-24 MED ORDER — SODIUM CHLORIDE 0.9 % IV SOLN: 250.0000 mL | INTRAVENOUS | Status: DC

## 2017-05-24 MED ORDER — ACETAMINOPHEN 325 MG PO TABS
650.0000 mg | ORAL_TABLET | ORAL | Status: DC | PRN
  Administered 2017-06-01: 650 mg via ORAL
  Filled 2017-05-24: qty 2

## 2017-05-24 MED ORDER — PHENYLEPHRINE HCL 10 MG/ML IJ SOLN
INTRAMUSCULAR | Status: DC | PRN
  Administered 2017-05-24 (×4): 80 ug via INTRAVENOUS

## 2017-05-24 MED ORDER — LIDOCAINE 2% (20 MG/ML) 5 ML SYRINGE
INTRAMUSCULAR | Status: AC
  Filled 2017-05-24: qty 5

## 2017-05-24 MED ORDER — GABAPENTIN 300 MG PO CAPS
600.0000 mg | ORAL_CAPSULE | Freq: Two times a day (BID) | ORAL | Status: DC
  Administered 2017-05-24 – 2017-06-02 (×17): 600 mg via ORAL
  Filled 2017-05-24 (×17): qty 2

## 2017-05-24 MED ORDER — MENTHOL 3 MG MT LOZG: 1.0000 | LOZENGE | OROMUCOSAL | Status: DC | PRN

## 2017-05-24 MED ORDER — PREDNISONE 5 MG PO TABS: 5.0000 mg | ORAL_TABLET | Freq: Every day | ORAL | Status: DC

## 2017-05-24 MED ORDER — FENTANYL CITRATE (PF) 250 MCG/5ML IJ SOLN
INTRAMUSCULAR | Status: AC
  Filled 2017-05-24: qty 5

## 2017-05-24 MED ORDER — OXYCODONE HCL 5 MG PO TABS
5.0000 mg | ORAL_TABLET | ORAL | Status: DC | PRN
  Administered 2017-05-24 – 2017-05-26 (×4): 10 mg via ORAL
  Administered 2017-05-27 – 2017-05-28 (×2): 5 mg via ORAL
  Administered 2017-05-29 – 2017-06-01 (×9): 10 mg via ORAL
  Administered 2017-06-02: 5 mg via ORAL
  Filled 2017-05-24 (×3): qty 2
  Filled 2017-05-24: qty 1
  Filled 2017-05-24 (×10): qty 2
  Filled 2017-05-24 (×2): qty 1
  Filled 2017-05-24: qty 2

## 2017-05-24 MED ORDER — OXYCODONE HCL 5 MG PO TABS: 5.0000 mg | ORAL_TABLET | Freq: Once | ORAL | Status: DC | PRN

## 2017-05-24 MED ORDER — BISACODYL 5 MG PO TBEC: 5.0000 mg | DELAYED_RELEASE_TABLET | Freq: Every day | ORAL | Status: DC | PRN

## 2017-05-24 MED ORDER — MEPERIDINE HCL 50 MG/ML IJ SOLN: 6.2500 mg | INTRAMUSCULAR | Status: DC | PRN

## 2017-05-24 MED ORDER — 0.9 % SODIUM CHLORIDE (POUR BTL) OPTIME
TOPICAL | Status: DC | PRN
  Administered 2017-05-24: 1000 mL

## 2017-05-24 MED ORDER — MYCOPHENOLATE SODIUM 180 MG PO TBEC
180.0000 mg | DELAYED_RELEASE_TABLET | Freq: Two times a day (BID) | ORAL | Status: DC
  Administered 2017-05-24 – 2017-05-25 (×2): 180 mg via ORAL
  Filled 2017-05-24 (×2): qty 1

## 2017-05-24 MED ORDER — SUGAMMADEX SODIUM 200 MG/2ML IV SOLN
INTRAVENOUS | Status: AC
  Filled 2017-05-24: qty 2

## 2017-05-24 MED ORDER — TACROLIMUS 0.5 MG PO CAPS: 0.5000 mg | ORAL_CAPSULE | Freq: Two times a day (BID) | ORAL | Status: DC

## 2017-05-24 MED ORDER — DEXTROSE 5 % IV SOLN
INTRAVENOUS | Status: DC | PRN
  Administered 2017-05-24: 20 ug/min via INTRAVENOUS

## 2017-05-24 MED ORDER — ONDANSETRON HCL 4 MG/2ML IJ SOLN: 4.0000 mg | Freq: Four times a day (QID) | INTRAMUSCULAR | Status: DC | PRN

## 2017-05-24 MED ORDER — OXYCODONE HCL 5 MG/5ML PO SOLN: 5.0000 mg | Freq: Once | ORAL | Status: DC | PRN

## 2017-05-24 MED ORDER — K PHOS MONO-SOD PHOS DI & MONO 155-852-130 MG PO TABS
250.0000 mg | ORAL_TABLET | Freq: Two times a day (BID) | ORAL | Status: DC
  Administered 2017-05-25 – 2017-06-02 (×12): 250 mg via ORAL
  Filled 2017-05-24 (×18): qty 1

## 2017-05-24 MED ORDER — THROMBIN (RECOMBINANT) 20000 UNITS EX SOLR
CUTANEOUS | Status: DC | PRN
  Administered 2017-05-24: 19:00:00 via TOPICAL

## 2017-05-24 MED ORDER — DOCUSATE SODIUM 100 MG PO CAPS
100.0000 mg | ORAL_CAPSULE | Freq: Two times a day (BID) | ORAL | Status: DC
  Administered 2017-05-24 – 2017-06-02 (×14): 100 mg via ORAL
  Filled 2017-05-24 (×14): qty 1

## 2017-05-24 MED ORDER — DEXAMETHASONE SODIUM PHOSPHATE 10 MG/ML IJ SOLN
INTRAMUSCULAR | Status: AC
  Filled 2017-05-24: qty 1

## 2017-05-24 MED ORDER — FENTANYL CITRATE (PF) 100 MCG/2ML IJ SOLN
INTRAMUSCULAR | Status: AC
  Filled 2017-05-24: qty 2

## 2017-05-24 MED ORDER — ROCURONIUM BROMIDE 50 MG/5ML IV SOLN
INTRAVENOUS | Status: AC
  Filled 2017-05-24: qty 1

## 2017-05-24 MED ORDER — ACETAMINOPHEN 325 MG PO TABS: 325.0000 mg | ORAL_TABLET | ORAL | Status: DC | PRN

## 2017-05-24 MED ORDER — ROCURONIUM BROMIDE 100 MG/10ML IV SOLN
INTRAVENOUS | Status: DC | PRN
  Administered 2017-05-24: 50 mg via INTRAVENOUS

## 2017-05-24 MED ORDER — RENA-VITE PO TABS
1.0000 | ORAL_TABLET | Freq: Every day | ORAL | Status: DC
Start: 2017-05-25 — End: 2017-06-02
  Administered 2017-05-25 – 2017-06-02 (×7): 1 via ORAL
  Filled 2017-05-24 (×7): qty 1

## 2017-05-24 MED ORDER — DEXAMETHASONE SODIUM PHOSPHATE 10 MG/ML IJ SOLN
4.0000 mg | Freq: Three times a day (TID) | INTRAMUSCULAR | Status: DC
  Administered 2017-05-24 – 2017-05-30 (×18): 4 mg via INTRAVENOUS
  Filled 2017-05-24 (×18): qty 1

## 2017-05-24 MED ORDER — MIDAZOLAM HCL 2 MG/2ML IJ SOLN
INTRAMUSCULAR | Status: AC
  Filled 2017-05-24: qty 2

## 2017-05-24 MED ORDER — ACETAMINOPHEN 160 MG/5ML PO SOLN: 325.0000 mg | ORAL | Status: DC | PRN

## 2017-05-24 MED ORDER — PREDNISONE 5 MG PO TABS
7.5000 mg | ORAL_TABLET | Freq: Every day | ORAL | Status: DC
  Administered 2017-05-25 – 2017-06-01 (×8): 7.5 mg via ORAL
  Filled 2017-05-24 (×8): qty 2

## 2017-05-24 SURGICAL SUPPLY — 70 items
ADH SKN CLS APL DERMABOND .7 (GAUZE/BANDAGES/DRESSINGS) ×1
BLADE CLIPPER SURG (BLADE) IMPLANT
BUR DRUM 4.0 (BURR) ×4 IMPLANT
BUR DRUM 4.0MM (BURR) ×2
BUR MATCHSTICK NEURO 3.0 LAGG (BURR) ×3 IMPLANT
BUR ROUND FLUTED 4 SOFT TCH (BURR) ×1 IMPLANT
BUR ROUND FLUTED 4MM SOFT TCH (BURR) ×1
CAGE FUSION 14X23-32 2-5 DEG (Cage) IMPLANT
CAGE FUSION 14X33-54 2-5D (Cage) ×2 IMPLANT
CANISTER SUCT 3000ML PPV (MISCELLANEOUS) ×3 IMPLANT
CARTRIDGE OIL MAESTRO DRILL (MISCELLANEOUS) ×1 IMPLANT
DECANTER SPIKE VIAL GLASS SM (MISCELLANEOUS) ×3 IMPLANT
DERMABOND ADVANCED (GAUZE/BANDAGES/DRESSINGS) ×2
DERMABOND ADVANCED .7 DNX12 (GAUZE/BANDAGES/DRESSINGS) ×1 IMPLANT
DIFFUSER DRILL AIR PNEUMATIC (MISCELLANEOUS) ×3 IMPLANT
DRAPE HALF SHEET 40X57 (DRAPES) IMPLANT
DRAPE LAPAROTOMY 100X72 PEDS (DRAPES) ×3 IMPLANT
DRAPE MICROSCOPE LEICA (MISCELLANEOUS) ×3 IMPLANT
DRAPE POUCH INSTRU U-SHP 10X18 (DRAPES) ×1 IMPLANT
DRAPE SHEET LG 3/4 BI-LAMINATE (DRAPES) ×2 IMPLANT
DURAPREP 6ML APPLICATOR 50/CS (WOUND CARE) ×3 IMPLANT
ELECT COATED BLADE 2.86 ST (ELECTRODE) ×3 IMPLANT
ELECT REM PT RETURN 9FT ADLT (ELECTROSURGICAL) ×3
ELECTRODE REM PT RTRN 9FT ADLT (ELECTROSURGICAL) ×1 IMPLANT
EVACUATOR SILICONE 100CC (DRAIN) IMPLANT
GAUZE SPONGE 4X4 16PLY XRAY LF (GAUZE/BANDAGES/DRESSINGS) IMPLANT
GLOVE BIO SURGEON STRL SZ 6.5 (GLOVE) ×6 IMPLANT
GLOVE BIO SURGEON STRL SZ7 (GLOVE) ×6 IMPLANT
GLOVE BIO SURGEON STRL SZ8 (GLOVE) ×2 IMPLANT
GLOVE BIO SURGEON STRL SZ8.5 (GLOVE) ×2 IMPLANT
GLOVE BIO SURGEONS STRL SZ 6.5 (GLOVE) ×3
GLOVE BIOGEL PI IND STRL 6.5 (GLOVE) IMPLANT
GLOVE BIOGEL PI IND STRL 7.5 (GLOVE) IMPLANT
GLOVE BIOGEL PI INDICATOR 6.5 (GLOVE) ×2
GLOVE BIOGEL PI INDICATOR 7.5 (GLOVE) ×2
GLOVE ECLIPSE 6.5 STRL STRAW (GLOVE) ×6 IMPLANT
GLOVE EXAM NITRILE LRG STRL (GLOVE) IMPLANT
GLOVE EXAM NITRILE XL STR (GLOVE) IMPLANT
GLOVE EXAM NITRILE XS STR PU (GLOVE) IMPLANT
GOWN STRL REUS W/ TWL LRG LVL3 (GOWN DISPOSABLE) ×2 IMPLANT
GOWN STRL REUS W/ TWL XL LVL3 (GOWN DISPOSABLE) IMPLANT
GOWN STRL REUS W/TWL 2XL LVL3 (GOWN DISPOSABLE) IMPLANT
GOWN STRL REUS W/TWL LRG LVL3 (GOWN DISPOSABLE) ×6
GOWN STRL REUS W/TWL XL LVL3 (GOWN DISPOSABLE) ×3
HALTER HD/CHIN CERV TRACTION D (MISCELLANEOUS) IMPLANT
HEMOSTAT SURGICEL 2X14 (HEMOSTASIS) IMPLANT
KIT BASIN OR (CUSTOM PROCEDURE TRAY) ×3 IMPLANT
KIT TURNOVER KIT B (KITS) ×3 IMPLANT
NDL HYPO 25X1 1.5 SAFETY (NEEDLE) ×1 IMPLANT
NDL SPNL 22GX3.5 QUINCKE BK (NEEDLE) ×1 IMPLANT
NEEDLE HYPO 25X1 1.5 SAFETY (NEEDLE) ×3 IMPLANT
NEEDLE SPNL 22GX3.5 QUINCKE BK (NEEDLE) ×3 IMPLANT
NS IRRIG 1000ML POUR BTL (IV SOLUTION) ×3 IMPLANT
OIL CARTRIDGE MAESTRO DRILL (MISCELLANEOUS) ×3
PACK LAMINECTOMY NEURO (CUSTOM PROCEDURE TRAY) ×3 IMPLANT
PAD ARMBOARD 7.5X6 YLW CONV (MISCELLANEOUS) ×5 IMPLANT
PIN DISTRACTION 14MM (PIN) IMPLANT
PLATE CERV TI DYNATRAN 390X54 (Plate) ×2 IMPLANT
RUBBERBAND STERILE (MISCELLANEOUS) ×6 IMPLANT
SCREW R HYBIRD VA 4.0X14MM (Screw) ×12 IMPLANT
SPONGE INTESTINAL PEANUT (DISPOSABLE) ×5 IMPLANT
SPONGE NEURO XRAY DETECT 1X3 (DISPOSABLE) ×3 IMPLANT
SPONGE SURGIFOAM ABS GEL 100 (HEMOSTASIS) ×2 IMPLANT
SUT VIC AB 0 CT1 27 (SUTURE) ×3
SUT VIC AB 0 CT1 27XBRD ANTBC (SUTURE) ×1 IMPLANT
SUT VIC AB 3-0 SH 8-18 (SUTURE) ×3 IMPLANT
TOWEL GREEN STERILE (TOWEL DISPOSABLE) ×3 IMPLANT
TOWEL GREEN STERILE FF (TOWEL DISPOSABLE) ×3 IMPLANT
TRAY FOLEY MTR SLVR 16FR STAT (SET/KITS/TRAYS/PACK) ×3 IMPLANT
WATER STERILE IRR 1000ML POUR (IV SOLUTION) ×3 IMPLANT

## 2017-05-24 NOTE — Transfer of Care (Signed)
Immediate Anesthesia Transfer of Care Note  Patient: [name]  Procedure(s) Performed: CORPECTOMY CERVICAL FIVE- CERVICAL SIX (N/A Neck)  Patient Location: PACU  Anesthesia Type:General  Level of Consciousness: awake, oriented, drowsy and patient cooperative  Airway & Oxygen Therapy: Patient Spontanous Breathing and Patient connected to nasal cannula oxygen  Post-op Assessment: Report given to RN, Post -op Vital signs reviewed and stable and Patient moving all extremities X 4  Post vital signs: Reviewed and stable  Last Vitals:  Vitals Value Taken Time  BP 148/77 05/24/2017  9:32 PM  Temp    Pulse 101 05/24/2017  9:36 PM  Resp 18 05/24/2017  9:36 PM  SpO2 100 % 05/24/2017  9:36 PM  Vitals shown include unvalidated device data.  Last Pain:  Vitals:   05/24/17 1317  TempSrc:   PainSc: 10-Worst pain ever         Complications: No apparent anesthesia complications

## 2017-05-24 NOTE — H&P (Signed)
BP 126/69   Pulse 79   Temp 98.8 F (37.1 C) (Oral)   Resp 18   Ht 5\' 7"  (1.702 m)   Wt 61.6 kg (135 lb 11.2 oz)   SpO2 100%   BMI 21.25 kg/m  [name] is a patient of mine for whom I had done a posterior cervical decompression at C3 through C6. She had a normal lordosis and ideally placed hardware. She was operated on for cervical stenosis. She has now received a kidney transplant and is doing well. She says that, about 3 or 4 weeks, ago she started having severe pain in her neck. It is sometimes radiating into the upper extremity. When we did the operation, there was none of this; frankly, she did not have pain at that time.  She is 5 feet 7 inches tall and weighs 133 pounds. Temperature is 99, blood pressure is 147/74, pulse is 94. She says the pain is a 10 out of 10.  I will see what an MRI shows and also obtain some plain films, but she truly can date this to postop, not that I think that the operation had anything to do with this, but just in terms of time she essentially thinks of it before the transplant and after the transplant. PHYSICAL EXAMINATION: Reflexes intact at the knees and ankles. She is weak in the right upper extremity, in all muscle groups: Grip, triceps, biceps, deltoid. Left side stronger. She has a positive Romberg test. Gait is unsteady. She feels her balance is much worse now than it has been in the past. What has happened with [name] is that on plain films one can easily see that C5-6 has collapsed into C7.  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  Where she had the operation at the other levels, looks good.  She is decompressed, but she is completely collapsed here.  I think she needs a corpectomy of C5 and C6, possibly C7.  T1 is well above the clavicle, so it can be accessed I believe fairly  easily.  But if C7 can be retained, I would.  The biggest problem here is that she is a renal transplant, is on immunosuppressives, is on prednisone and thus, without question, I am going to have to do a front and back.  I will have to do this because I do not have any good reason to believe that this bone is going to hold up with time.  I plan on doing the first part, the corpectomy, using plate and screws and a strut graft, and then a few days later turn her over and do the posterior cervical procedure.  I explained this in great detail.  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 Strength, however, is good in the upper and lower extremities.  Memory, language, attention span, and fund of knowledge is normal.    Vital signs today include height 5 feet 7 inches, weight 134 pounds, blood pressure 123/62, pulse is 88, temperature is 97.  She has pain of 10/10 in the neck, arms, hands, and numbness in the upper extremities.

## 2017-05-24 NOTE — Anesthesia Preprocedure Evaluation (Addendum)
Anesthesia Evaluation  Patient identified by MRN, date of birth, ID band Patient awake    Reviewed: Allergy & Precautions, H&P , NPO status , Patient's Chart, lab work & pertinent test results  History of Anesthesia Complications Negative for: history of anesthetic complications  Airway Mallampati: II   Neck ROM: full    Dental  (+) Missing, Dental Advisory Given   Pulmonary Current Smoker, former smoker,    breath sounds clear to auscultation       Cardiovascular hypertension, Pt. on medications  Rhythm:regular Rate:Normal + Systolic murmurs  Echo 75/64/33   The left ventricular size is normal. There is normal left ventricular wall  thickness. Left ventricular systolic function is normal. LV ejection fraction = 55-60%. Left ventricular filling pattern is prolonged relaxation. The right ventricle is normal in size and function. There is mild mitral annular calcification. The mitral valve leaflets appear  thickened, but open well. There is mild mitral regurgitation. Structurally normal tricuspid valve. There is moderate tricuspid  regurgitation. The aortic root is normal size. IVC size was normal.   Carotid U/S 09/25/15  Minimal plaque formation of bilateral bulbs. Velocities within normal limites. Bilateral 20-39% diameter reduction, no significant BICA stenosis. Bilateral vertebral artery flow is antegrade. Has RUE AVF.     Neuro/Psych PSYCHIATRIC DISORDERS Anxiety    GI/Hepatic negative GI ROS, Neg liver ROS, GERD  ,  Endo/Other  Hypothyroidism   Renal/GU Renal disease  negative genitourinary   Musculoskeletal  (+) Arthritis , Osteoarthritis,    Abdominal   Peds negative pediatric ROS (+)  Hematology  (+) anemia ,   Anesthesia Other Findings   Reproductive/Obstetrics negative OB ROS                           Anesthesia Physical  Anesthesia Plan  ASA: III  Anesthesia Plan:  General   Post-op Pain Management:    Induction: Intravenous  PONV Risk Score and Plan: 3 and Ondansetron, Dexamethasone, Propofol, Midazolam and Treatment may vary due to age or medical condition  Airway Management Planned: Oral ETT and Video Laryngoscope Planned  Additional Equipment:   Intra-op Plan:   Post-operative Plan: Extubation in OR  Informed Consent: I have reviewed the patients History and Physical, chart, labs and discussed the procedure including the risks, benefits and alternatives for the proposed anesthesia with the patient or authorized representative who has indicated his/her understanding and acceptance.   Dental advisory given  Plan Discussed with: CRNA, Anesthesiologist and Surgeon  Anesthesia Plan Comments:        Anesthesia Quick Evaluation

## 2017-05-24 NOTE — Anesthesia Procedure Notes (Signed)
Procedure Name: Intubation Date/Time: 05/24/2017 5:55 PM Performed by: White, Amedeo Plenty, CRNA Pre-anesthesia Checklist: Patient identified, Emergency Drugs available, Suction available and Patient being monitored Patient Re-evaluated:Patient Re-evaluated prior to induction Oxygen Delivery Method: Circle System Utilized Preoxygenation: Pre-oxygenation with 100% oxygen Induction Type: IV induction Ventilation: Mask ventilation without difficulty Laryngoscope Size: Glidescope and 3 (elective glidescope d/t cervical neuropathies) Grade View: Grade I Tube type: Oral Tube size: 7.0 mm Number of attempts: 1 Airway Equipment and Method: Stylet Placement Confirmation: ETT inserted through vocal cords under direct vision,  positive ETCO2 and breath sounds checked- equal and bilateral Secured at: 21 cm Tube secured with: Tape Dental Injury: Teeth and Oropharynx as per pre-operative assessment

## 2017-05-24 NOTE — Anesthesia Postprocedure Evaluation (Signed)
Anesthesia Post Note  Patient: [name]  Procedure(s) Performed: CORPECTOMY CERVICAL FIVE- CERVICAL SIX (N/A Neck)     Patient location during evaluation: PACU Anesthesia Type: General Level of consciousness: awake and alert Pain management: pain level controlled Vital Signs Assessment: post-procedure vital signs reviewed and stable Respiratory status: spontaneous breathing, nonlabored ventilation, respiratory function stable and patient connected to nasal cannula oxygen Cardiovascular status: blood pressure returned to baseline and stable Postop Assessment: no apparent nausea or vomiting Anesthetic complications: no    Last Vitals:  Vitals:   05/24/17 2145 05/24/17 2200  BP: (!) 151/78 (!) 141/80  Pulse: 97 93  Resp: 14 20  Temp:  36.8 C  SpO2: 100% 100%    Last Pain:  Vitals:   05/24/17 2200  TempSrc:   PainSc: 0-No pain                 Adaiah Jaskot COKER

## 2017-05-25 ENCOUNTER — Other Ambulatory Visit: Payer: Self-pay

## 2017-05-25 MED ORDER — MYCOPHENOLATE SODIUM 180 MG PO TBEC: 720.0000 mg | DELAYED_RELEASE_TABLET | Freq: Two times a day (BID) | ORAL | Status: DC

## 2017-05-25 MED ORDER — GABAPENTIN 300 MG PO CAPS
300.0000 mg | ORAL_CAPSULE | Freq: Every day | ORAL | Status: DC
  Administered 2017-05-25 – 2017-06-01 (×7): 300 mg via ORAL
  Filled 2017-05-25 (×7): qty 1

## 2017-05-25 MED ORDER — MYCOPHENOLATE SODIUM 180 MG PO TBEC
720.0000 mg | DELAYED_RELEASE_TABLET | Freq: Two times a day (BID) | ORAL | Status: DC
  Administered 2017-05-25 – 2017-06-02 (×15): 720 mg via ORAL
  Filled 2017-05-25 (×16): qty 4

## 2017-05-25 MED FILL — Thrombin For Soln 20000 Unit: CUTANEOUS | Qty: 1 | Status: AC

## 2017-05-25 MED FILL — Thrombin For Soln 5000 Unit: CUTANEOUS | Qty: 5000 | Status: AC

## 2017-05-25 MED FILL — Gelatin Absorbable MT Powder: OROMUCOSAL | Qty: 1 | Status: AC

## 2017-05-25 NOTE — Progress Notes (Signed)
Patient ID: [name], female   DOB: 06-14-1960, 57 y.o.   MRN: [id number] BP (!) 149/69 (BP Location: Left Arm)   Pulse 99   Temp 98.8 F (37.1 C) (Oral)   Resp (!) 26   Ht 5\' 7"  (1.702 m)   Wt 67.6 kg (149 lb 0.5 oz)   SpO2 100%   BMI 23.34 kg/m  Alert and oriented x 4, speech is clear and fluent Moving all extremities well Wound is healing well, voice is strong Will go next week for posterior approach and arthrodesis

## 2017-05-25 NOTE — Progress Notes (Signed)
Patient walked in hallway X 1 lap aprox 100 steps. Patient tolerated well. Returned to the bed and PCP is starting HS care.

## 2017-05-26 ENCOUNTER — Encounter (HOSPITAL_COMMUNITY): Payer: Self-pay | Admitting: Neurosurgery

## 2017-05-26 ENCOUNTER — Inpatient Hospital Stay: Admit: 2017-05-26 | Payer: Medicare Other | Admitting: Neurosurgery

## 2017-05-26 SURGERY — POSTERIOR CERVICAL FUSION/FORAMINOTOMY LEVEL 5
Anesthesia: General

## 2017-05-27 NOTE — Progress Notes (Signed)
Patient ID: [name], female   DOB: 03/06/1960, 57 y.o.   MRN: [id number] BP 114/62 (BP Location: Right Arm) Comment: map 76  Pulse 71   Temp 98.2 F (36.8 C) (Oral)   Resp 18   Ht 5\' 7"  (1.702 m)   Wt 67.6 kg (149 lb 0.5 oz)   SpO2 100%   BMI 23.34 kg/m  Alert and oriented x4, speech is clear  And fluent, voice is strong Wound is clean, and dry. No signs of infection Moving all extremities well Second stage next week.

## 2017-05-27 NOTE — Progress Notes (Signed)
Patient ID: [name], female   DOB: [birth year], 57 y.o.   MRN: [id number] Doing well. Appropriate neck soreness. A little numbness and tingling in the index and middle fingers of the hands but otherwise feels her hands are getting better. Overall doing well. Awaiting surgery on Monday

## 2017-05-28 NOTE — Progress Notes (Signed)
Patient ID: [name], female   DOB: [birth year], 57 y.o.   MRN: [id number] Doing well. No complaints. Moving all extremities. Seems pleased. Awaiting surgery tomorrow.

## 2017-05-29 ENCOUNTER — Other Ambulatory Visit: Payer: Self-pay | Admitting: Neurosurgery

## 2017-05-29 NOTE — Op Note (Addendum)
05/24/2017  6:58 PM  PATIENT:  [name]  57 y.o. female with a kyphotic collapse of C5, and 6 into C7, causing cord compression  PRE-OPERATIVE DIAGNOSIS:  CERVICAL STENOSIS OF SPINAL CANAL C5,6,7 Renal dystrophy POST-OPERATIVE DIAGNOSIS:  Same  PROCEDURE:  Anterior Cervical corpectomy C5,6,7 Arthrodesis C4-T1 with expandable titanium cage(depuy), packed with autograft morsels Anterior instrumentation(Depuy) C4-T1, translational plate  SURGEON:   Surgeon(s): Ashok Pall, MD Newman Pies, MD   ASSISTANTS:jenkins, jeffrey  ANESTHESIA:   general  EBL:  Total I/O In: 240 [P.O.:240] Out: 0   BLOOD ADMINISTERED:none  CELL SAVER GIVEN:none  COUNT:per nursing  DRAINS: none   SPECIMEN:  No Specimen  DICTATION: Mrs. Sciara was taken to the operating room, intubated, and placed under general anesthesia without difficulty. She was positioned supine with her head in slight extension on a horseshoe headrest. The neck was prepped and draped in a sterile manner. I infiltrated 4 cc's 1/2%lidocaine/1:200,000 strength epinephrine into the planned incision starting from the midline to the medial border of the left sternocleidomastoid muscle. I opened the incision with a 10 blade and dissected sharply through soft tissue to the platysma. I dissected in the plane superior to the platysma both rostrally and caudally. I then opened the platysma in a horizontal fashion with Metzenbaum scissors, and dissected in the inferior plane rostrally and caudally. With both blunt and sharp technique I created an avascular corridor to the cervical spine. I placed a spinal needle(s) in the disc space at 5/6 . I then reflected the longus colli from C4 to T1 and placed self retaining retractors. I opened the disc space(s) at 4/5,5/6, and 6/7 with a 15 blade. I removed disc with curettes, Kerrison punches, and the drill. Using the Leksell rongeur, and the drill I performed corpectomies at C5, C6 and C7. We  decompressed the spinal canal and the C5,6,and 7 nerve roots bilaterally. I used the microscope to aid in microdissection. We removed the posterior longitudinal ligament to fully expose and decompress the thecal sac. I exposed the roots laterally taking down the 4/5,5/6, and 6/7 uncovertebral joints. With the decompression complete we moved on to the arthrodesis. We used the drill to level the surfaces of C4 and T1. I removed soft tissue to prepare the space and the bony surfaces. I measured the space and placed an expandable titanium cage filled allograft morsels into the C4-T1 space.  We then placed the anterior instrumentation. I placed 2 screws in each vertebral body through the plate. I locked the screws into place. Intraoperative xray showed the graft, plate, and screws to be in good position. I irrigated the wound, achieved hemostasis, and closed the wound in layers. I approximated the platysma, and the subcuticular plane with vicryl sutures. I used Dermabond for a sterile dressing.   PLAN OF CARE: Admit to inpatient   PATIENT DISPOSITION:  PACU - hemodynamically stable.   Delay start of Pharmacological VTE agent (>24hrs) due to surgical blood loss or risk of bleeding:  yes

## 2017-05-29 NOTE — Progress Notes (Signed)
Patient ID: [name], female   DOB: 1960-07-16, 57 y.o.   MRN: [id number] BP (!) 117/59   Pulse (P) 81   Temp (P) 98.1 F (36.7 C) (Oral)   Resp 18   Ht 5\' 7"  (1.702 m)   Wt 67.6 kg (149 lb 0.5 oz)   SpO2 100%   BMI 23.34 kg/m  Alert and oriented x 4, speech is clear, voice is strong. For posterior arthrodesis tomorrow.

## 2017-05-30 ENCOUNTER — Inpatient Hospital Stay (HOSPITAL_COMMUNITY): Payer: Medicare Other | Admitting: Anesthesiology

## 2017-05-30 ENCOUNTER — Encounter (HOSPITAL_COMMUNITY): Payer: Self-pay

## 2017-05-30 ENCOUNTER — Inpatient Hospital Stay (HOSPITAL_COMMUNITY): Payer: Medicare Other

## 2017-05-30 ENCOUNTER — Inpatient Hospital Stay (HOSPITAL_COMMUNITY)
Admission: RE | Disposition: A | Discharge status: Home / Self Care | Payer: Self-pay | Source: Ambulatory Visit | Attending: Neurosurgery

## 2017-05-30 DIAGNOSIS — N250 Renal osteodystrophy: Secondary | ICD-10-CM | POA: Diagnosis present

## 2017-05-30 HISTORY — PX: POSTERIOR CERVICAL FUSION/FORAMINOTOMY: SHX5038

## 2017-05-30 LAB — CBC
HCT: 32.1 % — ABNORMAL LOW (ref 36.0–46.0)
Hemoglobin: 9.8 g/dL — ABNORMAL LOW (ref 12.0–15.0)
MCH: 29.1 pg (ref 26.0–34.0)
MCHC: 30.5 g/dL (ref 30.0–36.0)
MCV: 95.3 fL (ref 78.0–100.0)
PLATELETS: 279 10*3/uL (ref 150–400)
RBC: 3.37 MIL/uL — ABNORMAL LOW (ref 3.87–5.11)
RDW: 16.1 % — ABNORMAL HIGH (ref 11.5–15.5)
WBC: 10.8 10*3/uL — ABNORMAL HIGH (ref 4.0–10.5)

## 2017-05-30 LAB — BASIC METABOLIC PANEL
Anion gap: 9 (ref 5–15)
BUN: 20 mg/dL (ref 6–20)
CHLORIDE: 99 mmol/L — AB (ref 101–111)
CO2: 24 mmol/L (ref 22–32)
CREATININE: 0.93 mg/dL (ref 0.44–1.00)
Calcium: 9.6 mg/dL (ref 8.9–10.3)
GFR calc Af Amer: >60 mL/min (ref >60)
GLUCOSE: 127 mg/dL — AB (ref 65–99)
Potassium: 5.4 mmol/L — ABNORMAL HIGH (ref 3.5–5.1)
SODIUM: 132 mmol/L — AB (ref 135–145)

## 2017-05-30 SURGERY — POSTERIOR CERVICAL FUSION/FORAMINOTOMY LEVEL 4
Anesthesia: General | Site: Neck

## 2017-05-30 MED ORDER — ACETAMINOPHEN 10 MG/ML IV SOLN
1000.0000 mg | Freq: Once | INTRAVENOUS | Status: AC
  Administered 2017-05-30: 1000 mg via INTRAVENOUS

## 2017-05-30 MED ORDER — DEXAMETHASONE SODIUM PHOSPHATE 10 MG/ML IJ SOLN
INTRAMUSCULAR | Status: AC
  Filled 2017-05-30: qty 1

## 2017-05-30 MED ORDER — SODIUM CHLORIDE 0.9% FLUSH
3.0000 mL | Freq: Two times a day (BID) | INTRAVENOUS | Status: DC
  Administered 2017-05-31 – 2017-06-01 (×4): 3 mL via INTRAVENOUS

## 2017-05-30 MED ORDER — SODIUM CHLORIDE 0.9% FLUSH: 3.0000 mL | INTRAVENOUS | Status: DC | PRN

## 2017-05-30 MED ORDER — FENTANYL CITRATE (PF) 250 MCG/5ML IJ SOLN
INTRAMUSCULAR | Status: AC
  Filled 2017-05-30: qty 5

## 2017-05-30 MED ORDER — MIDAZOLAM HCL 2 MG/2ML IJ SOLN
INTRAMUSCULAR | Status: AC
  Filled 2017-05-30: qty 2

## 2017-05-30 MED ORDER — MIDAZOLAM HCL 2 MG/2ML IJ SOLN
INTRAMUSCULAR | Status: DC | PRN
  Administered 2017-05-30: 2 mg via INTRAVENOUS

## 2017-05-30 MED ORDER — VALGANCICLOVIR HCL 450 MG PO TABS
450.0000 mg | ORAL_TABLET | Freq: Every day | ORAL | Status: DC
  Filled 2017-05-30: qty 1

## 2017-05-30 MED ORDER — HYDROMORPHONE HCL 2 MG/ML IJ SOLN
0.2500 mg | INTRAMUSCULAR | Status: DC | PRN
  Administered 2017-05-30 (×3): 0.5 mg via INTRAVENOUS

## 2017-05-30 MED ORDER — THROMBIN 20000 UNITS EX SOLR
CUTANEOUS | Status: AC
  Filled 2017-05-30: qty 20000

## 2017-05-30 MED ORDER — LIDOCAINE 2% (20 MG/ML) 5 ML SYRINGE
INTRAMUSCULAR | Status: AC
  Filled 2017-05-30: qty 5

## 2017-05-30 MED ORDER — FENTANYL CITRATE (PF) 100 MCG/2ML IJ SOLN
INTRAMUSCULAR | Status: AC
  Filled 2017-05-30: qty 2

## 2017-05-30 MED ORDER — ONDANSETRON HCL 4 MG/2ML IJ SOLN
INTRAMUSCULAR | Status: AC
  Filled 2017-05-30: qty 2

## 2017-05-30 MED ORDER — FENTANYL CITRATE (PF) 250 MCG/5ML IJ SOLN
INTRAMUSCULAR | Status: DC | PRN
  Administered 2017-05-30: 150 ug via INTRAVENOUS
  Administered 2017-05-30: 100 ug via INTRAVENOUS

## 2017-05-30 MED ORDER — GLYCOPYRROLATE 0.2 MG/ML IJ SOLN
INTRAMUSCULAR | Status: DC | PRN
  Administered 2017-05-30: 0.2 mg via INTRAVENOUS

## 2017-05-30 MED ORDER — 0.9 % SODIUM CHLORIDE (POUR BTL) OPTIME
TOPICAL | Status: DC | PRN
  Administered 2017-05-30: 1000 mL

## 2017-05-30 MED ORDER — LACTATED RINGERS IV SOLN
INTRAVENOUS | Status: DC
  Administered 2017-05-30 (×2): via INTRAVENOUS

## 2017-05-30 MED ORDER — ONDANSETRON HCL 4 MG/2ML IJ SOLN
INTRAMUSCULAR | Status: DC | PRN
  Administered 2017-05-30: 4 mg via INTRAVENOUS

## 2017-05-30 MED ORDER — PHENYLEPHRINE HCL 10 MG/ML IJ SOLN
INTRAMUSCULAR | Status: DC | PRN
  Administered 2017-05-30 (×4): 80 ug via INTRAVENOUS

## 2017-05-30 MED ORDER — PROMETHAZINE HCL 25 MG/ML IJ SOLN: 6.2500 mg | INTRAMUSCULAR | Status: DC | PRN

## 2017-05-30 MED ORDER — FENTANYL CITRATE (PF) 100 MCG/2ML IJ SOLN
25.0000 ug | INTRAMUSCULAR | Status: DC | PRN
  Administered 2017-05-30 (×2): 25 ug via INTRAVENOUS
  Administered 2017-05-30 (×2): 50 ug via INTRAVENOUS

## 2017-05-30 MED ORDER — THROMBIN (RECOMBINANT) 20000 UNITS EX SOLR
CUTANEOUS | Status: DC | PRN
  Administered 2017-05-30: 20 mL via TOPICAL

## 2017-05-30 MED ORDER — DEXAMETHASONE SODIUM PHOSPHATE 10 MG/ML IJ SOLN
INTRAMUSCULAR | Status: DC | PRN
  Administered 2017-05-30: 10 mg via INTRAVENOUS

## 2017-05-30 MED ORDER — SUGAMMADEX SODIUM 200 MG/2ML IV SOLN
INTRAVENOUS | Status: AC
  Filled 2017-05-30: qty 2

## 2017-05-30 MED ORDER — HYDROMORPHONE HCL 2 MG/ML IJ SOLN
INTRAMUSCULAR | Status: AC
  Filled 2017-05-30: qty 1

## 2017-05-30 MED ORDER — CHLORHEXIDINE GLUCONATE CLOTH 2 % EX PADS: 6.0000 | MEDICATED_PAD | Freq: Once | CUTANEOUS | Status: DC

## 2017-05-30 MED ORDER — LIDOCAINE HCL (CARDIAC) PF 100 MG/5ML IV SOSY
PREFILLED_SYRINGE | INTRAVENOUS | Status: DC | PRN
  Administered 2017-05-30: 100 mg via INTRATRACHEAL

## 2017-05-30 MED ORDER — POTASSIUM CHLORIDE IN NACL 20-0.9 MEQ/L-% IV SOLN
INTRAVENOUS | Status: DC
  Administered 2017-05-31: 06:00:00 via INTRAVENOUS
  Filled 2017-05-30: qty 1000

## 2017-05-30 MED ORDER — ALBUMIN HUMAN 5 % IV SOLN
INTRAVENOUS | Status: DC | PRN
  Administered 2017-05-30: 21:00:00 via INTRAVENOUS

## 2017-05-30 MED ORDER — ROCURONIUM BROMIDE 100 MG/10ML IV SOLN
INTRAVENOUS | Status: DC | PRN
  Administered 2017-05-30 (×2): 50 mg via INTRAVENOUS

## 2017-05-30 MED ORDER — ACETAMINOPHEN 10 MG/ML IV SOLN
INTRAVENOUS | Status: AC
  Filled 2017-05-30: qty 100

## 2017-05-30 MED ORDER — ROCURONIUM BROMIDE 50 MG/5ML IV SOLN
INTRAVENOUS | Status: AC
  Filled 2017-05-30: qty 1

## 2017-05-30 MED ORDER — VANCOMYCIN HCL IN DEXTROSE 1-5 GM/200ML-% IV SOLN
1000.0000 mg | INTRAVENOUS | Status: AC
  Administered 2017-05-30: 1000 mg via INTRAVENOUS

## 2017-05-30 MED ORDER — PROPOFOL 10 MG/ML IV BOLUS
INTRAVENOUS | Status: AC
  Filled 2017-05-30: qty 20

## 2017-05-30 MED ORDER — SODIUM CHLORIDE 0.9 % IV SOLN: 250.0000 mL | INTRAVENOUS | Status: DC

## 2017-05-30 MED ORDER — LIDOCAINE-EPINEPHRINE 0.5 %-1:200000 IJ SOLN
INTRAMUSCULAR | Status: AC
  Filled 2017-05-30: qty 1

## 2017-05-30 MED ORDER — ACETAMINOPHEN 500 MG PO TABS
1000.0000 mg | ORAL_TABLET | Freq: Four times a day (QID) | ORAL | Status: AC
  Administered 2017-05-31: 500 mg via ORAL
  Administered 2017-05-31 (×3): 1000 mg via ORAL
  Filled 2017-05-30 (×4): qty 2

## 2017-05-30 MED ORDER — BACITRACIN ZINC 500 UNIT/GM EX OINT
TOPICAL_OINTMENT | CUTANEOUS | Status: DC | PRN
  Administered 2017-05-30: 1 via TOPICAL

## 2017-05-30 SURGICAL SUPPLY — 70 items
APL SKNCLS STERI-STRIP NONHPOA (GAUZE/BANDAGES/DRESSINGS) ×2
BAG DECANTER FOR FLEXI CONT (MISCELLANEOUS) ×3 IMPLANT
BENZOIN TINCTURE PRP APPL 2/3 (GAUZE/BANDAGES/DRESSINGS) ×6 IMPLANT
BIT DRILL 2.4 (BIT) ×2 IMPLANT
BIT DRILL 2.4MM (BIT) ×1
BLADE CLIPPER SURG (BLADE) ×3 IMPLANT
BLADE ULTRA TIP 2M (BLADE) IMPLANT
BUR MATCHSTICK NEURO 3.0 LAGG (BURR) ×3 IMPLANT
CANISTER SUCT 3000ML PPV (MISCELLANEOUS) ×3 IMPLANT
CARTRIDGE OIL MAESTRO DRILL (MISCELLANEOUS) ×1 IMPLANT
CLOSURE WOUND 1/2 X4 (GAUZE/BANDAGES/DRESSINGS)
DECANTER SPIKE VIAL GLASS SM (MISCELLANEOUS) ×3 IMPLANT
DIFFUSER DRILL AIR PNEUMATIC (MISCELLANEOUS) ×3 IMPLANT
DRAPE C-ARM 42X72 X-RAY (DRAPES) ×6 IMPLANT
DRAPE LAPAROTOMY 100X72 PEDS (DRAPES) ×3 IMPLANT
DRAPE POUCH INSTRU U-SHP 10X18 (DRAPES) ×3 IMPLANT
DRSG OPSITE POSTOP 4X8 (GAUZE/BANDAGES/DRESSINGS) ×2 IMPLANT
DURAPREP 6ML APPLICATOR 50/CS (WOUND CARE) ×3 IMPLANT
ELECT REM PT RETURN 9FT ADLT (ELECTROSURGICAL) ×3
ELECTRODE REM PT RTRN 9FT ADLT (ELECTROSURGICAL) ×1 IMPLANT
GAUZE SPONGE 4X4 12PLY STRL (GAUZE/BANDAGES/DRESSINGS) IMPLANT
GAUZE SPONGE 4X4 16PLY XRAY LF (GAUZE/BANDAGES/DRESSINGS) ×2 IMPLANT
GLOVE BIO SURGEON STRL SZ 6.5 (GLOVE) ×3 IMPLANT
GLOVE BIO SURGEON STRL SZ7 (GLOVE) ×2 IMPLANT
GLOVE BIO SURGEONS STRL SZ 6.5 (GLOVE) ×3
GLOVE BIOGEL PI IND STRL 7.5 (GLOVE) IMPLANT
GLOVE BIOGEL PI INDICATOR 7.5 (GLOVE) ×2
GLOVE ECLIPSE 6.5 STRL STRAW (GLOVE) ×3 IMPLANT
GLOVE ECLIPSE 7.0 STRL STRAW (GLOVE) ×3 IMPLANT
GLOVE EXAM NITRILE LRG STRL (GLOVE) IMPLANT
GLOVE EXAM NITRILE XL STR (GLOVE) IMPLANT
GLOVE EXAM NITRILE XS STR PU (GLOVE) IMPLANT
GOWN STRL REUS W/ TWL LRG LVL3 (GOWN DISPOSABLE) IMPLANT
GOWN STRL REUS W/ TWL XL LVL3 (GOWN DISPOSABLE) ×1 IMPLANT
GOWN STRL REUS W/TWL 2XL LVL3 (GOWN DISPOSABLE) IMPLANT
GOWN STRL REUS W/TWL LRG LVL3 (GOWN DISPOSABLE) ×9
GOWN STRL REUS W/TWL XL LVL3 (GOWN DISPOSABLE) ×3
GRAFT BN 10X1XDBM MAGNIFUSE (Bone Implant) IMPLANT
GRAFT BONE MAGNIFUSE 1X10CM (Bone Implant) ×3 IMPLANT
KIT BASIN OR (CUSTOM PROCEDURE TRAY) ×3 IMPLANT
KIT POSITION SURG JACKSON T1 (MISCELLANEOUS) ×3 IMPLANT
KIT TURNOVER KIT B (KITS) ×3 IMPLANT
NDL HYPO 25X1 1.5 SAFETY (NEEDLE) ×1 IMPLANT
NEEDLE HYPO 25X1 1.5 SAFETY (NEEDLE) ×3 IMPLANT
NS IRRIG 1000ML POUR BTL (IV SOLUTION) ×3 IMPLANT
OIL CARTRIDGE MAESTRO DRILL (MISCELLANEOUS) ×3
PACK LAMINECTOMY NEURO (CUSTOM PROCEDURE TRAY) ×3 IMPLANT
PAD ARMBOARD 7.5X6 YLW CONV (MISCELLANEOUS) ×9 IMPLANT
PIN MAYFIELD SKULL DISP (PIN) ×3 IMPLANT
ROD SPINAL PRE CUT 3.5X70 (Rod) ×4 IMPLANT
SCREW MA INFINITY 3.5X12 (Screw) ×14 IMPLANT
SCREW MA INFINITY 4X12 (Screw) ×3 IMPLANT
SCREW MA INFINITY 4X16 (Screw) ×2 IMPLANT
SCREW MULTI AXIAL 3.5X18MM (Screw) ×6 IMPLANT
SCREW SET 3600315 STANDARD (Screw) ×30 IMPLANT
SCREW SET 3600315 STD (Screw) IMPLANT
SPONGE LAP 4X18 X RAY DECT (DISPOSABLE) IMPLANT
SPONGE SURGIFOAM ABS GEL 100 (HEMOSTASIS) ×3 IMPLANT
STAPLER SKIN PROX WIDE 3.9 (STAPLE) ×3 IMPLANT
STRIP CLOSURE SKIN 1/2X4 (GAUZE/BANDAGES/DRESSINGS) IMPLANT
SUT ETHILON 3 0 FSL (SUTURE) ×3 IMPLANT
SUT VIC AB 0 CT1 18XCR BRD8 (SUTURE) ×1 IMPLANT
SUT VIC AB 0 CT1 8-18 (SUTURE) ×9
SUT VIC AB 2-0 CT1 18 (SUTURE) ×5 IMPLANT
SUT VIC AB 3-0 SH 8-18 (SUTURE) ×3 IMPLANT
TAPE CLOTH 3X10 TAN LF (GAUZE/BANDAGES/DRESSINGS) ×3 IMPLANT
TOWEL GREEN STERILE (TOWEL DISPOSABLE) ×3 IMPLANT
TOWEL GREEN STERILE FF (TOWEL DISPOSABLE) ×3 IMPLANT
UNDERPAD 30X30 (UNDERPADS AND DIAPERS) ×3 IMPLANT
WATER STERILE IRR 1000ML POUR (IV SOLUTION) ×3 IMPLANT

## 2017-05-30 NOTE — Progress Notes (Signed)
Report attempted x 1

## 2017-05-30 NOTE — Anesthesia Procedure Notes (Signed)
Procedure Name: Intubation Date/Time: 05/30/2017 6:47 PM Performed by: Claris Che, CRNA Pre-anesthesia Checklist: Patient identified, Emergency Drugs available, Suction available, Patient being monitored and Timeout performed Patient Re-evaluated:Patient Re-evaluated prior to induction Oxygen Delivery Method: Circle system utilized Preoxygenation: Pre-oxygenation with 100% oxygen Induction Type: IV induction Ventilation: Mask ventilation without difficulty Laryngoscope Size: Glidescope and 3 Grade View: Grade II Tube type: Oral Tube size: 7.5 mm Number of attempts: 1 Airway Equipment and Method: Stylet and Video-laryngoscopy Placement Confirmation: ETT inserted through vocal cords under direct vision,  positive ETCO2 and breath sounds checked- equal and bilateral Secured at: 23 cm Tube secured with: Tape Dental Injury: Teeth and Oropharynx as per pre-operative assessment

## 2017-05-30 NOTE — Progress Notes (Signed)
Dr. Smith Robert made aware pt had coffee and graham crackers this morning at 1100am. Per pt, she was instructed it was "okay" by Dr. Christella Noa.  Dr Lacy Duverney office called for orders.

## 2017-05-30 NOTE — Transfer of Care (Signed)
Immediate Anesthesia Transfer of Care Note  Patient: [name]  Procedure(s) Performed: POSTERIOR CERVICAL Arthrodesis Cervical three - four, cervical four - five, cervical five - six, cervical six - seven (N/A Neck)  Patient Location: PACU  Anesthesia Type:General  Level of Consciousness: awake, oriented, drowsy, patient cooperative and responds to stimulation  Airway & Oxygen Therapy: Patient Spontanous Breathing and Patient connected to nasal cannula oxygen  Post-op Assessment: Report given to RN, Post -op Vital signs reviewed and stable and Patient moving all extremities X 4  Post vital signs: Reviewed and stable  Last Vitals:  Vitals Value Taken Time  BP 181/91 05/30/2017 10:02 PM  Temp 36.4 C 05/30/2017  9:58 PM  Pulse 83 05/30/2017 10:02 PM  Resp 10 05/30/2017 10:02 PM  SpO2 100 % 05/30/2017 10:02 PM  Vitals shown include unvalidated device data.  Last Pain:  Vitals:   05/30/17 1015  TempSrc:   PainSc: 3       Patients Stated Pain Goal: 4(pt stated wanting to wait for normally scheduled pain meds ) (70/14/10 3013)  Complications: No apparent anesthesia complications

## 2017-05-30 NOTE — Anesthesia Preprocedure Evaluation (Signed)
Anesthesia Evaluation  Patient identified by MRN, date of birth, ID band Patient awake    Reviewed: Allergy & Precautions, NPO status , Patient's Chart, lab work & pertinent test results  Airway Mallampati: II  TM Distance: >3 FB Neck ROM: Limited   Comment: C-collar Dental  (+) Dental Advisory Given, Missing   Pulmonary former smoker,    Pulmonary exam normal breath sounds clear to auscultation       Cardiovascular hypertension, (-) angina(-) Past MI  Rhythm:Regular Rate:Normal + Systolic murmurs Echo 03/70/48   The left ventricular size is normal. There is normal left ventricular wall thickness. Left ventricular systolic function is normal. LV ejection fraction = 55-60%. Left ventricular filling pattern as prolonged relaxation. The right ventricle is normal in size and function. There is mild mitral annular calcification. The mitral valve leaflets appear thickened, but open well. There is mild mitral regurgitation. Structurally normal tricuspid valve. There is moderate tricuspid  Regurgitation. The aortic root is normal size. IVC size was normal.    Neuro/Psych PSYCHIATRIC DISORDERS Anxiety negative neurological ROS     GI/Hepatic negative GI ROS, Neg liver ROS,   Endo/Other  Hypothyroidism   Renal/GU negative Renal ROSS/p kidney transplant 1/19     Musculoskeletal  (+) Arthritis ,   Abdominal   Peds  Hematology  (+) Blood dyscrasia, anemia ,   Anesthesia Other Findings Day of surgery medications reviewed with the patient.  Reproductive/Obstetrics                             Anesthesia Physical Anesthesia Plan  ASA: III  Anesthesia Plan: General   Post-op Pain Management:    Induction: Intravenous  PONV Risk Score and Plan: 3 and Midazolam, Dexamethasone and Ondansetron  Airway Management Planned: Oral ETT and Video Laryngoscope Planned  Additional Equipment:   Intra-op Plan:    Post-operative Plan: Extubation in OR  Informed Consent: I have reviewed the patients History and Physical, chart, labs and discussed the procedure including the risks, benefits and alternatives for the proposed anesthesia with the patient or authorized representative who has indicated his/her understanding and acceptance.   Dental advisory given  Plan Discussed with: CRNA  Anesthesia Plan Comments:         Anesthesia Quick Evaluation

## 2017-05-30 NOTE — Care Management Important Message (Signed)
Important Message  Patient Details  Name: [name] MRN: [id number] Date of Birth: 03-14-1960   Medicare Important Message Given:  Yes    Kale Dols 05/30/2017, 7:40 AM

## 2017-05-31 MED FILL — Thrombin For Soln 20000 Unit: CUTANEOUS | Qty: 1 | Status: AC

## 2017-05-31 NOTE — Evaluation (Signed)
Occupational Therapy Evaluation Patient Details Name: [name] MRN: [id number] DOB: [birth year] Today's Date: 05/31/2017    History of Present Illness 57 yo female PCF C3-4 C4-5 C5-6 C6-7 PMH ACDF 07/2016   Clinical Impression   Patient is s/p PCF C3-7 surgery resulting in functional limitations due to the deficits listed below (see OT problem list). Pt currently with decr fine motor and gross motor affecting all adls. Pt provided hand exercises and demonstration this session. OT to will continue to follow up on exercises due to poor recall from last admission and self reports "I didn't have exercises and no therapies".  Patient will benefit from skilled OT acutely to increase independence and safety with ADLS to allow discharge outpatient.     Follow Up Recommendations  Outpatient OT    Equipment Recommendations  None recommended by OT    Recommendations for Other Services       Precautions / Restrictions Precautions Precautions: Cervical Precaution Comments: handout for cervical precautions and fine motor provided Required Braces or Orthoses: Cervical Brace Cervical Brace: Hard collar;For comfort Restrictions Weight Bearing Restrictions: Yes Other Position/Activity Restrictions: pt is minimal lifting at this time less than 5 pounds due to cervical precautions bil UE      Mobility Bed Mobility               General bed mobility comments: in chair on arrival  Transfers Overall transfer level: Needs assistance Equipment used: Rolling walker (2 wheeled) Transfers: Sit to/from Stand Sit to Stand: Min assist         General transfer comment: Pt requires education on lateral lean to advance to edge of chair and (A) to power up     Balance                                           ADL either performed or assessed with clinical judgement   ADL Overall ADL's : Needs assistance/impaired Eating/Feeding: Set up   Grooming: Set  up;Sitting Grooming Details (indicate cue type and reason): incr time and opening containers         Upper Body Dressing : Minimal assistance Upper Body Dressing Details (indicate cue type and reason): pt requries minimal (A) to don doff cervical collar. pt educated to complete in sitting using "clam shell " method of only opening one side. Pt able to pull tight. pt educated on proper fit     Toilet Transfer: Minimal Insurance claims handler Details (indicate cue type and reason): pt requires scooting to edge and A to power up into standing. pt unable to push without (A)         Functional mobility during ADLs: Min guard;Rolling walker     Cervical precautions ( handout provided): Educated patient on don doff brace with return demonstration, educated on oral care using cups,  avoid neck rotation flexion and extension, positioning with pillows in chair for bil UE, sleeping positioning, avoiding pushing / pulling with bil UE, and fine motor exercises ( handout provided). Instructed on wall slides FF 90 degrees and table top slides.  Pt educated on need to notify doctor / RN of swallowing changes or choking..    Vision   Vision Assessment?: No apparent visual deficits     Perception     Praxis      Pertinent Vitals/Pain Pain Assessment: Faces Pain Score: 4  Faces Pain  Scale: Hurts little more Pain Location: hands; when scapula are retracted Pain Descriptors / Indicators: Operative site guarding Pain Intervention(s): Monitored during session;Premedicated before session;Repositioned     Hand Dominance Right   Extremity/Trunk Assessment Upper Extremity Assessment Upper Extremity Assessment: RUE deficits/detail;LUE deficits/detail RUE Deficits / Details: reports index finger is the most painful. pt with sensation changes minimal in thumb and most in index first ( 2nd digit). pt reports only the 5th digit normal and no changes. pt reports bee stings, burning , and numbness. pt  reports it has improved after first surgery but its still there; pt is unable to complete pad to pad 1st digit to 3rd digit RUE Sensation: decreased light touch RUE Coordination: decreased fine motor;decreased gross motor LUE Deficits / Details: reports index finger is the most painful. pt with sensation changes minimal in thumb and most in index first ( 2nd digit). pt reports only the 5th digit normal and no changes. pt reports bee stings, burning , and numbness. pt reports it has improved after first surgery but its still there LUE Sensation: decreased light touch LUE Coordination: decreased fine motor;decreased gross motor   Lower Extremity Assessment Lower Extremity Assessment: Defer to PT evaluation   Cervical / Trunk Assessment Cervical / Trunk Assessment: Other exceptions Cervical / Trunk Exceptions: s/p 2nd surgery   Communication Communication Communication: No difficulties   Cognition Arousal/Alertness: Awake/alert Behavior During Therapy: WFL for tasks assessed/performed Overall Cognitive Status: Within Functional Limits for tasks assessed                                     General Comments  dressing wet and noted drainage onto pads of ccollar    Exercises Exercises: Hand activities;Other exercises Other Exercises Other Exercises: Fine motor handout provided and instructed on first 6 exercises. Pt instructed on 90 degrees shoulder flexion and lap slides. pt does not tolerate scapula retraction well due to pain at this time.    Shoulder Instructions      Home Living Family/patient expects to be discharged to:: Private residence Living Arrangements: Parent Available Help at Discharge: Family;Available 24 hours/day Type of Home: House Home Access: Stairs to enter [employer] of Steps: 5 Entrance Stairs-Rails: Right;Left;Can reach both Home Layout: One level     Bathroom Shower/Tub: Teacher, early years/pre: Handicapped height      Home Equipment: Shower seat;Grab bars - tub/shower;Hand held shower head   Additional Comments: daughter will be (A)ing initially at home      Prior Functioning/Environment Level of Independence: Needs assistance    ADL's / Homemaking Assistance Needed: (A) with opening containers            OT Problem List: Decreased strength;Decreased range of motion;Decreased activity tolerance;Impaired balance (sitting and/or standing);Decreased safety awareness;Decreased knowledge of use of DME or AE;Decreased knowledge of precautions;Impaired sensation;Impaired UE functional use;Pain      OT Treatment/Interventions: Self-care/ADL training;Therapeutic exercise;Neuromuscular education;Energy conservation;DME and/or AE instruction;Therapeutic activities;Manual therapy;Patient/family education;Balance training    OT Goals(Current goals can be found in the care plan section) Acute Rehab OT Goals Patient Stated Goal: to get my hands better OT Goal Formulation: With patient Time For Goal Achievement: 06/14/17 Potential to Achieve Goals: Good  OT Frequency: Min 2X/week   Barriers to D/C:            Co-evaluation  AM-PAC PT "6 Clicks" Daily Activity     Outcome Measure Help from another person eating meals?: A Little Help from another person taking care of personal grooming?: A Little Help from another person toileting, which includes using toliet, bedpan, or urinal?: A Little Help from another person bathing (including washing, rinsing, drying)?: A Little Help from another person to put on and taking off regular upper body clothing?: A Little Help from another person to put on and taking off regular lower body clothing?: A Little 6 Click Score: 18   End of Session Equipment Utilized During Treatment: Cervical collar Nurse Communication: Mobility status;Precautions  Activity Tolerance: Patient tolerated treatment well Patient left: in chair;with call bell/phone  within reach(educated on pillow placement)  OT Visit Diagnosis: Unsteadiness on feet (R26.81)                Time: 8938-1017 OT Time Calculation (min): 21 min Charges:  OT General Charges $OT Visit: 1 Visit OT Evaluation $OT Eval Moderate Complexity: 1 Mod G-Codes:      Jeri Modena   OTR/L Pager: [phone] Office: [phone] .   Parke Poisson B 05/31/2017, 12:26 PM

## 2017-05-31 NOTE — Anesthesia Postprocedure Evaluation (Signed)
Anesthesia Post Note  Patient: [name]  Procedure(s) Performed: POSTERIOR CERVICAL Arthrodesis Cervical three - four, cervical four - five, cervical five - six, cervical six - seven (N/A Neck)     Patient location during evaluation: PACU Anesthesia Type: General Level of consciousness: awake and alert Pain management: pain level controlled Vital Signs Assessment: post-procedure vital signs reviewed and stable Respiratory status: spontaneous breathing, nonlabored ventilation and respiratory function stable Cardiovascular status: blood pressure returned to baseline and stable Postop Assessment: no apparent nausea or vomiting Anesthetic complications: no    Last Vitals:  Vitals:   05/31/17 0430 05/31/17 0743  BP: 122/61 104/60  Pulse: 68 67  Resp:    Temp: 36.8 C 36.7 C  SpO2: 100% 100%    Last Pain:  Vitals:   05/31/17 0832  TempSrc:   PainSc: 8                  Catalina Gravel

## 2017-05-31 NOTE — Progress Notes (Addendum)
Physical Therapy Treatment Patient Details Name: [name] MRN: [id number] DOB: April 03, 1960 Today's Date: 5/15/20194    History of Present Illness 57 yo female PCF C3-4 C4-5 C5-6 C6-7 PMH ACDF 07/2016    PT Comments    Pt admitted with/for ACDF.  Pt needing min assist overall.  She has chronic gait deviation and instability that may benefit from OPPT.  Pt currently limited functionally due to the problems listed below.  (see problems list.)  Pt will benefit from PT to maximize function and safety to be able to get home safely with available assist.    Follow Up Recommendations  Outpatient PT;Other (comment)(possible no followup.)     Equipment Recommendations  Rolling walker with 5" wheels    Recommendations for Other Services       Precautions / Restrictions Precautions Precautions: Cervical Precaution Comments: handout for cervical precautions and fine motor provided Required Braces or Orthoses: Cervical Brace Cervical Brace: Hard collar;For comfort Restrictions Weight Bearing Restrictions: Yes Other Position/Activity Restrictions: pt is minimal lifting at this time less than 5 pounds due to cervical precautions bil UE    Mobility  Bed Mobility Overal bed mobility: Needs Assistance Bed Mobility: Rolling;Sidelying to Sit Rolling: Min assist Sidelying to sit: Min assist       General bed mobility comments: cues for technique and minimal trunk;/cervical stability/assist to come to sit.  Transfers Overall transfer level: Needs assistance Equipment used: Rolling walker (2 wheeled) Transfers: Sit to/from Stand Sit to Stand: Min assist         General transfer comment: Pt requires education on lateral lean to advance to edge of chair and (A) to power up   Ambulation/Gait Ambulation/Gait assistance: Min guard;Min assist Ambulation Distance (Feet): 140 Feet Assistive device: Rolling walker (2 wheeled) Gait Pattern/deviations: Step-through pattern Gait velocity:  variable, but generally slower.   General Gait Details: Initially min assist for tendency to lean posteriorly, then min assist for mild instability    Stairs             Wheelchair Mobility    Modified Rankin (Stroke Patients Only)       Balance Overall balance assessment: Mild deficits observed, not formally tested                                          Cognition Arousal/Alertness: Awake/alert Behavior During Therapy: WFL for tasks assessed/performed Overall Cognitive Status: Within Functional Limits for tasks assessed                                        Exercises Other Exercises Other Exercises: Fine motor handout provided and instructed on first 6 exercises. Pt instructed on 90 degrees shoulder flexion and lap slides. pt does not tolerate scapula retraction well due to pain at this time.     General Comments General comments (skin integrity, edema, etc.): pt instructed in back care/prec, log roll/transition side to/from sit, lifting precautions and progression of activity.      Pertinent Vitals/Pain Pain Assessment: Faces Pain Score: 4  Faces Pain Scale: Hurts little more Pain Location: hands; when scapula are retracted Pain Descriptors / Indicators: Operative site guarding Pain Intervention(s): Monitored during session    Home Living Family/patient expects to be discharged to:: Private residence Living Arrangements: Parent Available Help at  Discharge: Family;Available 24 hours/day Type of Home: House Home Access: Stairs to enter Entrance Stairs-Rails: Right;Left;Can reach both Home Layout: One level Home Equipment: Shower seat;Grab bars - tub/shower;Hand held shower head Additional Comments: daughter will be (A)ing initially at home    Prior Function Level of Independence: Needs assistance    ADL's / Homemaking Assistance Needed: (A) with opening containers     PT Goals (current goals can now be found in the  care plan section) Acute Rehab PT Goals Patient Stated Goal: to get my hands better PT Goal Formulation: With patient Time For Goal Achievement: 06/07/17 Potential to Achieve Goals: Good    Frequency    Min 5X/week      PT Plan      Co-evaluation              AM-PAC PT "6 Clicks" Daily Activity  Outcome Measure  Difficulty turning over in bed (including adjusting bedclothes, sheets and blankets)?: Unable Difficulty moving from lying on back to sitting on the side of the bed? : Unable Difficulty sitting down on and standing up from a chair with arms (e.g., wheelchair, bedside commode, etc,.)?: Unable Help needed moving to and from a bed to chair (including a wheelchair)?: A Little Help needed walking in hospital room?: A Little Help needed climbing 3-5 steps with a railing? : A Little 6 Click Score: 12    End of Session Equipment Utilized During Treatment: Cervical collar Activity Tolerance: Patient tolerated treatment well Patient left: in chair;with call bell/phone within reach Nurse Communication: Mobility status PT Visit Diagnosis: Unsteadiness on feet (R26.81);Other abnormalities of gait and mobility (R26.89);Pain Pain - part of body: (neck)     Time: 5465-0354 PT Time Calculation (min) (ACUTE ONLY): 36 min  Charges:  $Gait Training: 8-22 mins                    G Codes:       06-01-2017  Donnella Sham, PT [phone]  (pager)   Tessie Fass Gwen Sarvis 06-01-17, 1:04 PM

## 2017-05-31 NOTE — Progress Notes (Signed)
Patient ID: [name], female   DOB: January 07, 1961, 57 y.o.   MRN: [id number] BP (!) 155/72 (BP Location: Left Arm)   Pulse 92   Temp 98.1 F (36.7 C) (Oral)   Resp 15   Ht 5\' 7"  (1.702 m)   Wt 67.6 kg (149 lb 0.5 oz)   SpO2 100%   BMI 23.34 kg/m  Alert aNd oriented x 4, moving all extremities well Wounds are clean, dry, without signs of infection Discharge tomorrow.

## 2017-05-31 NOTE — Op Note (Signed)
05/30/2017  9:09 PM  PATIENT:  [name]  57 y.o. female  PRE-OPERATIVE DIAGNOSIS:  Renal osterodystrophy  POST-OPERATIVE DIAGNOSIS:  Renal osterodystrophy  PROCEDURE:  Procedure(s): POSTERIOR CERVICAL Arthrodesis Cervical three - four, cervical four - five, cervical five - six, cervical six - seven allograft morsels Posterior instrumentation lateral mass screws C3,5,6 on the right, C3,4,5,6 on the left, pedicle screws left C7, bilateral T1(medtronic) SURGEON: Surgeon(s): Ashok Pall, MD Consuella Lose, MD  ASSISTANTS:Nundkumar  ANESTHESIA:   general  EBL:  Total I/O In: -  Out: 100 [Urine:100]  BLOOD ADMINISTERED:none  CELL SAVER GIVEN:none  COUNT:per nursing  DRAINS: none   SPECIMEN:  No Specimen  DICTATION: [name] was taken to the operating room, intubated, and placed under a general anesthetic without difficulty. She was placed in a three pin [place] head holder without difficulty. Her head was positioned into the [place] adapter for the [place] table. [name]'s neck was in a neutral position and we confirmed this with fluoroscopy.  Her head and neck  was prepped and draped in a sterile manner.  I opened my previous incision and we dissected through the scar in the midline and exposed the lateral masses and lamina of C3,4,5,6,7 and T1 bilaterally. At this point we brought the fluoroscope into the operative field and started the hardware placement.  The lateral masses of C3-6 were identified. With fluoroscopic guidance we placed screws in the lateral masses of C3-6 bilaterally. The C4 screw on the right side cracked the lateral mass and was removed. At each level a pilot hole was created with either an awl or a high speed drill. Then using standard trajectories the screw holes were drilled, assessed for cutouts, then tapped. The screws were then placed. At C7 and T1 we attempted pedicle screws, failing at C7 on the left side, where a cutout was appreciated. The  other three pedicles were cannulated without difficulty, tapped and screws placed without difficulty.  Rods were placed within the screw heads, locking caps placed and secured. The construct now completed, we prepared to close. I irrigated then approximated the nuchal ligament and subcutaneous tissue with vicryl sutures, and the skin with a running nylon suture. I applied a sterile dressing. I removed her head from the adapter, and moved her onto the stretcher. I removed the head holder at that time.  PLAN OF CARE: Admit to inpatient   PATIENT DISPOSITION:  PACU - hemodynamically stable.   Delay start of Pharmacological VTE agent (>24hrs) due to surgical blood loss or risk of bleeding:  yes

## 2017-06-01 ENCOUNTER — Encounter (HOSPITAL_COMMUNITY): Payer: Self-pay | Admitting: Neurosurgery

## 2017-06-01 MED ORDER — OXYCODONE HCL 5 MG PO TABS: 5.0000 mg | ORAL_TABLET | ORAL | 0 refills | Status: DC | PRN

## 2017-06-01 MED ORDER — PREDNISONE 5 MG PO TABS
5.0000 mg | ORAL_TABLET | Freq: Every day | ORAL | Status: DC
  Administered 2017-06-02: 5 mg via ORAL
  Filled 2017-06-01: qty 1

## 2017-06-01 NOTE — Progress Notes (Signed)
Physical Therapy Treatment Patient Details Name: [name] MRN: [id number] DOB: [birth year] Today's Date: 06/01/2017    History of Present Illness 57 yo female PCF C3-4 C4-5 C5-6 C6-7 PMH ACDF 07/2016    PT Comments    Pt making great strides.  Emphasis on stairs, transitions with stress on safety, gait quality improvement and stamina.  Reinforced education.  Will reinforce bed mobility 5/17.   Follow Up Recommendations  Other (comment);Outpatient PT(possibly will improve gait quality enough to forego OPPT)     Equipment Recommendations  Rolling walker with 5" wheels    Recommendations for Other Services       Precautions / Restrictions Precautions Precautions: Cervical Precaution Comments: handout for cervical precautions and fine motor provided Required Braces or Orthoses: Cervical Brace Cervical Brace: Hard collar;For comfort Restrictions Weight Bearing Restrictions: No Other Position/Activity Restrictions: pt is minimal lifting at this time less than 5 pounds due to cervical precautions bil UE    Mobility  Bed Mobility               General bed mobility comments: OOB, will reassess and reinforce 5/17  Transfers Overall transfer level: Needs assistance Equipment used: Rolling walker (2 wheeled) Transfers: Sit to/from Stand Sit to Stand: Min guard         General transfer comment: cues for hand placement and getting to edge of the chair  Ambulation/Gait Ambulation/Gait assistance: Min guard Ambulation Distance (Feet): 100 Feet(x2) Assistive device: Rolling walker (2 wheeled) Gait Pattern/deviations: Step-through pattern Gait velocity: able to speed up significantly, but pt can not maintain the speed. Gait velocity interpretation: 1.31 - 2.62 ft/sec, indicative of limited community ambulator General Gait Details: Worked on decreasing consistency of knee flexion and improving heel/toe pattern.   Stairs Stairs: Yes Stairs assistance: Min assist Stair  Management: One rail Right;Step to pattern;Alternating pattern;Forwards Number of Stairs: 4 General stair comments: mild effort, needing stability assist   Wheelchair Mobility    Modified Rankin (Stroke Patients Only)       Balance Overall balance assessment: Needs assistance   Sitting balance-Leahy Scale: Good       Standing balance-Leahy Scale: Fair                              Cognition Arousal/Alertness: Awake/alert Behavior During Therapy: WFL for tasks assessed/performed Overall Cognitive Status: Within Functional Limits for tasks assessed                                        Exercises      General Comments        Pertinent Vitals/Pain Pain Assessment: Faces Faces Pain Scale: Hurts little more Pain Location: neck Pain Descriptors / Indicators: Operative site guarding Pain Intervention(s): Monitored during session    Home Living                      Prior Function            PT Goals (current goals can now be found in the care plan section) Acute Rehab PT Goals Patient Stated Goal: to get my hands better PT Goal Formulation: With patient Time For Goal Achievement: 06/07/17 Potential to Achieve Goals: Good Progress towards PT goals: Progressing toward goals    Frequency    Min 5X/week      PT Plan Current  plan remains appropriate    Co-evaluation              AM-PAC PT "6 Clicks" Daily Activity  Outcome Measure  Difficulty turning over in bed (including adjusting bedclothes, sheets and blankets)?: Unable Difficulty moving from lying on back to sitting on the side of the bed? : Unable Difficulty sitting down on and standing up from a chair with arms (e.g., wheelchair, bedside commode, etc,.)?: A Little Help needed moving to and from a bed to chair (including a wheelchair)?: A Little Help needed walking in hospital room?: A Little Help needed climbing 3-5 steps with a railing? : A Little 6  Click Score: 14    End of Session Equipment Utilized During Treatment: Cervical collar Activity Tolerance: Patient tolerated treatment well Patient left: in chair;with call bell/phone within reach Nurse Communication: Mobility status PT Visit Diagnosis: Unsteadiness on feet (R26.81);Other abnormalities of gait and mobility (R26.89);Pain Pain - part of body: (neck and in between shoulder blades)     Time: 7616-0737 PT Time Calculation (min) (ACUTE ONLY): 29 min  Charges:  $Gait Training: 8-22 mins $Therapeutic Activity: 8-22 mins                    G Codes:       13-Jun-2017  Donnella Sham, PT [phone]  (pager)   [name] 06-13-17, 12:07 PM

## 2017-06-01 NOTE — Progress Notes (Addendum)
MD office paged 1400, spoke with Caryl Pina, d/c orders in but printed prescription is to be signed.  Office to page MD. Office called back approximately 1430, per MD patient can leave now and have scripts sent to pharmacy or wait til he is able to come to floor to sign prescriptions.  Patient would like to talk to MD and wait for prescriptions. Continue to monitor.

## 2017-06-01 NOTE — Discharge Instructions (Signed)

## 2017-06-01 NOTE — Care Management Note (Signed)
Case Management Note  Patient Details  Name: [name] MRN: [id number] Date of Birth: 16-Oct-1960  Subjective/Objective:  57 yo female PCF C3-4 C4-5 C5-6 C6-7 PMH ACDF 07/2016.  PTA, pt independent, lives at home with mother.                    Action/Plan: Pt states daughter is coming from out of town to stay with her at discharge, and can provide assistance.  PT/OT recommending OP follow up, and referrals made in Epic to [place].  [facility] consulted for DME needs; RW to be delivered to pt's room prior to dc.    Expected Discharge Date:    06/01/17              Expected Discharge Plan:  OP Rehab  In-House Referral:     Discharge planning Services  CM Consult  Post Acute Care Choice:    Choice offered to:     DME Arranged:  Walker rolling DME Agency:  [place]:    [facility] Agency:     Status of Service:  Completed, signed off  If discussed at [place] of Stay Meetings, dates discussed:    Additional Comments:  Reinaldo Raddle, RN, BSN  Trauma/Neuro ICU Case Manager [phone]

## 2017-06-01 NOTE — Discharge Summary (Signed)
BP 123/67 (BP Location: Left Arm)   Pulse (!) 102   Temp 98.9 F (37.2 C) (Axillary)   Resp 15   Ht 5\' 7"  (1.702 m)   Wt 67.6 kg (149 lb 0.5 oz)   SpO2 100%   BMI 23.34 kg/m  Physician Discharge Summary  Patient ID: JOHNATHAN TORTORELLI MRN: [id number] DOB/AGE: 57-13-1962 57 y.o.  Admit date: 05/24/2017 Discharge date: 06/01/2017  Admission Diagnoses:renal osteodystrophy, cervical kyphosis, cervical stenosis  Discharge Diagnoses: same Active Problems:   S/P spinal surgery   Other secondary kyphosis, cervical region   Renal osteodystrophy   Discharged Condition: good  Hospital Course: mrs. Stoffel was admitted and initially taken to the operating room for a C5,6,and 7 corpectomy, strut graft and anterior plating. She had a severe kyphotic deformity and collapse of C5,6 onto C7. This caused spinal cord compression, and severe neck pain. After the first operation, she did very well. I then after a few days took her back to the OR and did a posterior arthrodesis from C3-T1. She has done very well after this. Her wounds are clean, dry, and without signs of infection. Her neurological exam is unchanged, good strength in all extremities. She is voiding, and tolerating a regular diet.   Treatments: surgery: Anterior Cervical corpectomy C5,6,7 Arthrodesis C4-T1 with expandable titanium cage(depuy), packed with autograft morsels Anterior instrumentation(Depuy) C4-T1, translational plate   POSTERIOR CERVICAL Arthrodesis Cervical three - four, cervical four - five, cervical five - six, cervical six - seven allograft morsels Posterior instrumentation lateral mass screws C3,5,6 on the right, C3,4,5,6 on the left, pedicle screws left C7, bilateral T1(medtronic)    Discharge Exam: Blood pressure 123/67, pulse (!) 102, temperature 98.9 F (37.2 C), temperature source Axillary, resp. rate 15, height 5\' 7"  (1.702 m), weight 67.6 kg (149 lb 0.5 oz), SpO2 100 %. General appearance: alert, cooperative, appears  stated age and no distress  Disposition: Discharge disposition: 01-Home or Self Care      Renal osterodystrophy Discharge Instructions    Ambulatory referral to Occupational Therapy   Complete by:  As directed    Ambulatory referral to Physical Therapy   Complete by:  As directed      Allergies as of 06/01/2017      Reactions   Contrast Media [iodinated Diagnostic Agents] Other (See Comments)   Cant take due to kidney transplant   Nsaids Other (See Comments)   Cant take due to kidney transplant    Penicillins Hives, Other (See Comments)   PATIENT HAS HAD A PCN REACTION WITH IMMEDIATE RASH, FACIAL/TONGUE/THROAT SWELLING, SOB, OR LIGHTHEADEDNESS WITH HYPOTENSION:  #  #  YES  #  #  Has patient had a PCN reaction causing severe rash involving mucus membranes or skin necrosis: No Has patient had a PCN reaction that required hospitalization No Has patient had a PCN reaction occurring within the last 10 years: No If all of the above answers are "NO", then may proceed with Cephalosporin   Ace Inhibitors Hives, Nausea And Vomiting   Codeine Hives      Medication List    TAKE these medications   acetaminophen 500 MG tablet Commonly known as:  TYLENOL Take 1,000 mg by mouth 3 (three) times daily as needed for moderate pain or headache.   aspirin EC 81 MG tablet Take 81 mg by mouth daily.   folic acid-vitamin b complex-vitamin c-selenium-zinc 3 MG Tabs tablet Take 1 tablet by mouth daily.   gabapentin 300 MG capsule Commonly known as:  NEURONTIN Take 600 mg by mouth 2 (two) times daily.   MG-PLUS PROTEIN PO Take 1 tablet by mouth 2 (two) times daily. Take 2 hours after myfortic   mycophenolate 180 MG EC tablet Commonly known as:  MYFORTIC Take 720 mg by mouth 2 (two) times daily.   oxyCODONE 5 MG immediate release tablet Commonly known as:  Oxy IR/ROXICODONE Take 1-2 tablets (5-10 mg total) by mouth every 4 (four) hours as needed for moderate pain.   phosphorus  155-852-130 MG tablet Commonly known as:  K PHOS NEUTRAL Take 250 mg by mouth 2 (two) times daily.   predniSONE 2.5 MG tablet Commonly known as:  DELTASONE Take 2.5 mg by mouth daily. Take with 5 mg to equal 7.5 mg once daily   ranitidine 150 MG tablet Commonly known as:  ZANTAC Take 150 mg by mouth at bedtime.   sodium bicarbonate 650 MG tablet Take 650 mg by mouth 2 (two) times daily.   sulfamethoxazole-trimethoprim 400-80 MG tablet Commonly known as:  BACTRIM,SEPTRA Take 1 tablet by mouth every Monday, Wednesday, and Friday.   tacrolimus 0.5 MG capsule Commonly known as:  PROGRAF Take 0.5 mg by mouth 2 (two) times daily. Take with two 1 mg capsules to equal 2.5 mg twice daily   tacrolimus 1 MG capsule Commonly known as:  PROGRAF Take 2 mg by mouth 2 (two) times daily. Take with 0.5 mg capsule to equal 2.5 mg twice daily   valGANciclovir 450 MG tablet Commonly known as:  VALCYTE Take 450 mg by mouth daily.            Durable Medical Equipment  (From admission, onward)        Start     Ordered   06/01/17 1200  For home use only DME Walker rolling  Once    Question:  Patient needs a walker to treat with the following condition  Answer:  Stenosis of cervical spine with myelopathy ([place])   06/01/17 1159     Follow-up Information    [facility] Follow up.   Specialty:  Rehabilitation Why:  Rehab center will call you for appointment. Contact information: [address] [place] [id number] [place] [address] [phone]       Ashok Pall, MD Follow up in 1 week(s).   Specialty:  Neurosurgery Why:  call for suture removal Contact information: [address] [place] [address] [phone]           Signed: Winfield Cunas 06/01/2017, 1:27 PM

## 2017-06-01 NOTE — Progress Notes (Signed)
Occupational Therapy Treatment Patient Details Name: [name] MRN: [id number] DOB: 07/07/1960 Today's Date: 06/01/2017    History of present illness 57 yo female PCF C3-4 C4-5 C5-6 C6-7 PMH ACDF 07/2016   OT comments  This 57 yo female seen today to address any concerns she had about basic ADLs due to possibly being D/C'd today. She had questions about tub transfers, LBD, toileting and bed mobility--all of these questions were answered and practiced with pt not having any further questions about basic ADLs.  Follow Up Recommendations  Outpatient OT;Supervision - Intermittent    Equipment Recommendations  None recommended by OT       Precautions / Restrictions Precautions Precautions: Cervical Precaution Comments: handout for cervical precautions provided Required Braces or Orthoses: Cervical Brace Cervical Brace: Hard collar;For comfort Restrictions Weight Bearing Restrictions: No Other Position/Activity Restrictions: pt is minimal lifting at this time less than 5 pounds due to cervical precautions bil UE       Mobility Bed Mobility               General bed mobility comments: pt up in recliner  Transfers Overall transfer level: Needs assistance Equipment used: Rolling walker (2 wheeled) Transfers: Sit to/from Stand Sit to Stand: Min guard         General transfer comment: cues for hand placement and getting to edge of the chair        ADL either performed or assessed with clinical judgement   ADL Overall ADL's : Needs assistance/impaired                       Lower Body Dressing Details (indicate cue type and reason): pt can cross her legs to get to her feet and was able to don and doff her socks in this manner; we discussed putting her LLE in pants and underwear first due to LLE is weaker and to put underwear and pants on before socks Toilet Transfer: Min guard;Ambulation;Comfort height toilet;Grab bars   Toileting- Clothing Manipulation and  Hygiene: Min guard;Sit to/from stand   Tub/ Shower Transfer: Tub transfer;Min guard;Ambulation;Rolling walker;Shower seat     General ADL Comments: Educated pt on wear and care of her c-collar and had pt change out her pads. Pt aware to use 2 cups for brushing teeth.      Vision Patient Visual Report: No change from baseline            Cognition Arousal/Alertness: Awake/alert Behavior During Therapy: WFL for tasks assessed/performed Overall Cognitive Status: Within Functional Limits for tasks assessed                                                     Pertinent Vitals/ Pain       Pain Assessment: No/denies pain         Frequency  Min 3X/week        Progress Toward Goals  OT Goals(current goals can now be found in the care plan section)  Progress towards OT goals: Progressing toward goals     Plan Discharge plan remains appropriate       AM-PAC PT "6 Clicks" Daily Activity     Outcome Measure   Help from another person eating meals?: None Help from another person taking care of personal grooming?: A Little Help from another person  toileting, which includes using toliet, bedpan, or urinal?: A Little Help from another person bathing (including washing, rinsing, drying)?: A Little Help from another person to put on and taking off regular upper body clothing?: A Little Help from another person to put on and taking off regular lower body clothing?: A Little 6 Click Score: 19    End of Session Equipment Utilized During Treatment: Cervical collar  OT Visit Diagnosis: Muscle weakness (generalized) (M62.81);Unsteadiness on feet (R26.81)   Activity Tolerance Patient tolerated treatment well   Patient Left in chair;with call bell/phone within reach   Nurse Communication          Time: 9847-3085 OT Time Calculation (min): 39 min  Charges: OT General Charges $OT Visit: 1 Visit OT Treatments $Self Care/Home Management : 38-52  mins  [address], OTR/L [phone] 06/01/2017

## 2017-06-02 NOTE — Progress Notes (Signed)
13:13 Patient discharged with all belongings, including walker, in stable condition with mother. Patient verbalized understanding of all discharge instructions and medications.

## 2017-06-02 NOTE — Progress Notes (Signed)
Physical Therapy Treatment Patient Details Name: [name] MRN: [id number] DOB: 06-27-1960 Today's Date: 06/02/2017    History of Present Illness 57 yo female PCF C3-4 C4-5 C5-6 C6-7 PMH ACDF 07/2016    PT Comments    Worked on improving heel/toe pattern.  Pt has the information needed to improve in the short term without OPPT   Follow Up Recommendations  No PT follow up     Equipment Recommendations  Rolling walker with 5" wheels    Recommendations for Other Services       Precautions / Restrictions      Mobility  Bed Mobility                  Transfers Overall transfer level: Needs assistance   Transfers: Sit to/from Stand Sit to Stand: Supervision         General transfer comment: good hand placement  Ambulation/Gait Ambulation/Gait assistance: Supervision;Min guard Ambulation Distance (Feet): 250 Feet Assistive device: Rolling walker (2 wheeled);None Gait Pattern/deviations: Step-through pattern Gait velocity: able to speed up significantly, but pt can not maintain the speed.  Pt finally fatigues and R LE gets clumbsy, BUT she can now go further before heel/toe pattern degrades.   General Gait Details: Worked on decreasing consistency of knee flexion and improving heel/toe pattern.  Over half of the distance completed with no device.   Stairs   Stairs assistance: Min guard Stair Management: One rail Right;Alternating pattern;Step to pattern;Forwards Number of Stairs: 5 General stair comments: mild effort, minimal use of the rail, no assist   Wheelchair Mobility    Modified Rankin (Stroke Patients Only)       Balance     Sitting balance-Leahy Scale: Good       Standing balance-Leahy Scale: Fair                              Cognition Arousal/Alertness: Awake/alert Behavior During Therapy: WFL for tasks assessed/performed Overall Cognitive Status: Within Functional Limits for tasks assessed                                        Exercises      General Comments        Pertinent Vitals/Pain Pain Assessment: Faces Faces Pain Scale: Hurts little more Pain Location: neck Pain Descriptors / Indicators: Operative site guarding Pain Intervention(s): Monitored during session    Home Living                      Prior Function            PT Goals (current goals can now be found in the care plan section) Acute Rehab PT Goals Patient Stated Goal: to get my hands better PT Goal Formulation: With patient Time For Goal Achievement: 06/07/17 Potential to Achieve Goals: Good Progress towards PT goals: Progressing toward goals    Frequency    Min 5X/week      PT Plan Current plan remains appropriate    Co-evaluation              AM-PAC PT "6 Clicks" Daily Activity  Outcome Measure  Difficulty turning over in bed (including adjusting bedclothes, sheets and blankets)?: A Little Difficulty moving from lying on back to sitting on the side of the bed? : A Little Difficulty sitting down on  and standing up from a chair with arms (e.g., wheelchair, bedside commode, etc,.)?: A Little Help needed moving to and from a bed to chair (including a wheelchair)?: A Little Help needed walking in hospital room?: A Little Help needed climbing 3-5 steps with a railing? : A Little 6 Click Score: 18    End of Session   Activity Tolerance: Patient tolerated treatment well Patient left: in chair;with call bell/phone within reach Nurse Communication: Mobility status PT Visit Diagnosis: Unsteadiness on feet (R26.81);Other abnormalities of gait and mobility (R26.89);Pain Pain - part of body: (neck)     Time: 2010-0712 PT Time Calculation (min) (ACUTE ONLY): 20 min  Charges:  $Gait Training: 8-22 mins                    G Codes:       06-11-17  Donnella Sham, PT [phone]  (pager)   Tessie Fass Dom [place] 06-11-17, 11:25 AM

## 2017-06-02 NOTE — Progress Notes (Signed)
10:08 Paged Dr. Christella Noa regarding patients request for vallum prescription at discharge and Valcyte needing to be removed from med rec. Patient aware she is no longer taking this.   11:40 Received call from [facility] with Dr. Christella Noa saying he typically doesn't prescribe vallum outpatient, but she can wait for him to make rounds. Patient requesting to go home instead of wait.

## 2017-06-02 NOTE — Progress Notes (Signed)
Occupational Therapy Treatment Patient Details Name: [name] MRN: [id number] DOB: 1960/11/15 Today's Date: 06/02/2017    History of present illness 57 yo female PCF C3-4 C4-5 C5-6 C6-7 PMH ACDF 07/2016   OT comments  Pt is adequate level for d/c home at this time. Pt with all education complete and handouts provided.   Follow Up Recommendations  Outpatient OT;Supervision - Intermittent    Equipment Recommendations  None recommended by OT    Recommendations for Other Services      Precautions / Restrictions Precautions Precautions: Cervical Precaution Comments: brace for comfort. up in chair with brace off. pt reports I am riding home with my mother so i will wear that brace Required Braces or Orthoses: Cervical Brace Cervical Brace: Hard collar;For comfort       Mobility Bed Mobility               General bed mobility comments: in chair on arrival  Transfers       Sit to Stand: Supervision              Balance                                           ADL either performed or assessed with clinical judgement   ADL                                         General ADL Comments: pt with good recall of bed mobility, don doff brace, use of cups for sink level task. pt however requires continued education for hand exercises. pt provided cup with paper clips, a lid and small stack of hand wipes to act as cards. pt asked to demonstrate all fine motor exercises from handout, lap slides and educated on new task of picking up small object and placing in a container. pt demonstrates trouble seperating the hand wipes from each other. pt demonstrates use of L hand but reports R hadn deficits worse. Pt encouraged to complete task bil UE but with R UE even when it is challenging     Vision       Perception     Praxis      Cognition Arousal/Alertness: Awake/alert Behavior During Therapy: WFL for tasks  assessed/performed Overall Cognitive Status: Within Functional Limits for tasks assessed                                 General Comments: pt needs reinforce of exercise education this session        Exercises Other Exercises Other Exercises: Fine motor handout located in room and instructed on first 6 exercises. Pt instructed on and lap slides. pt does t tolerate scapula retraction well this session for lap slides. pt needs manual input for full retraction for motor memory education   Shoulder Instructions       General Comments      Pertinent Vitals/ Pain       Pain Assessment: No/denies pain  Home Living  Prior Functioning/Environment              Frequency  Min 3X/week        Progress Toward Goals  OT Goals(current goals can now be found in the care plan section)  Progress towards OT goals: Progressing toward goals  Acute Rehab OT Goals Patient Stated Goal: to go home today with my mom OT Goal Formulation: With patient Time For Goal Achievement: 06/14/17 Potential to Achieve Goals: Good ADL Goals Pt Will Transfer to Toilet: with modified independence;ambulating;regular height toilet Pt/caregiver will Perform Home Exercise Program: Increased strength;Both right and left upper extremity;With written HEP provided;Independently  Plan Discharge plan remains appropriate    Co-evaluation                 AM-PAC PT "6 Clicks" Daily Activity     Outcome Measure   Help from another person eating meals?: None Help from another person taking care of personal grooming?: A Little Help from another person toileting, which includes using toliet, bedpan, or urinal?: A Little Help from another person bathing (including washing, rinsing, drying)?: A Little Help from another person to put on and taking off regular upper body clothing?: A Little Help from another person to put on and taking off  regular lower body clothing?: A Little 6 Click Score: 19    End of Session    OT Visit Diagnosis: Muscle weakness (generalized) (M62.81);Unsteadiness on feet (R26.81)   Activity Tolerance Patient tolerated treatment well   Patient Left in chair;with call bell/phone within reach   Nurse Communication Mobility status;Precautions        Time: 3664-4034 OT Time Calculation (min): 12 min  Charges: OT General Charges $OT Visit: 1 Visit OT Treatments $Therapeutic Exercise: 8-22 mins   Jeri Modena   OTR/L Pager: [phone] Office: [phone] .    Parke Poisson B 06/02/2017, 2:40 PM

## 2017-06-08 DIAGNOSIS — M4802 Spinal stenosis, cervical region: Secondary | ICD-10-CM | POA: Diagnosis not present

## 2017-06-08 DIAGNOSIS — R030 Elevated blood-pressure reading, without diagnosis of hypertension: Secondary | ICD-10-CM | POA: Diagnosis not present

## 2017-06-09 DIAGNOSIS — Z5181 Encounter for therapeutic drug level monitoring: Secondary | ICD-10-CM | POA: Diagnosis not present

## 2017-06-09 DIAGNOSIS — R000 Tachycardia, unspecified: Secondary | ICD-10-CM | POA: Diagnosis not present

## 2017-06-09 DIAGNOSIS — E876 Hypokalemia: Secondary | ICD-10-CM | POA: Diagnosis not present

## 2017-06-09 DIAGNOSIS — Z792 Long term (current) use of antibiotics: Secondary | ICD-10-CM | POA: Diagnosis not present

## 2017-06-09 DIAGNOSIS — E872 Acidosis: Secondary | ICD-10-CM | POA: Diagnosis not present

## 2017-06-09 DIAGNOSIS — Z7952 Long term (current) use of systemic steroids: Secondary | ICD-10-CM | POA: Diagnosis not present

## 2017-06-09 DIAGNOSIS — D899 Disorder involving the immune mechanism, unspecified: Secondary | ICD-10-CM | POA: Diagnosis not present

## 2017-06-09 DIAGNOSIS — I10 Essential (primary) hypertension: Secondary | ICD-10-CM | POA: Diagnosis not present

## 2017-06-09 DIAGNOSIS — Z4822 Encounter for aftercare following kidney transplant: Secondary | ICD-10-CM | POA: Diagnosis not present

## 2017-06-09 DIAGNOSIS — Z79899 Other long term (current) drug therapy: Secondary | ICD-10-CM | POA: Diagnosis not present

## 2017-06-09 DIAGNOSIS — J449 Chronic obstructive pulmonary disease, unspecified: Secondary | ICD-10-CM | POA: Diagnosis not present

## 2017-06-09 DIAGNOSIS — Z981 Arthrodesis status: Secondary | ICD-10-CM | POA: Diagnosis not present

## 2017-06-09 DIAGNOSIS — Z87891 Personal history of nicotine dependence: Secondary | ICD-10-CM | POA: Diagnosis not present

## 2017-06-09 DIAGNOSIS — D649 Anemia, unspecified: Secondary | ICD-10-CM | POA: Diagnosis not present

## 2017-06-20 NOTE — Progress Notes (Addendum)
Subjective:   [name] is a 57 y.o. female who presents for Medicare Annual (Subsequent) preventive examination.  Review of Systems:  No ROS.  Medicare Wellness Visit. Additional risk factors are reflected in the social history.  Cardiac Risk Factors include: advanced age (>28men, >24 women);hypertension Sleep patterns: gets up 2 times nightly to void and sleeps 6-7 hours nightly.    Home Safety/Smoke Alarms: Feels safe in home. Smoke alarms in place.  Living environment; residence and Firearm Safety: 1-story house/ trailer, no firearms. Lives with mother, no needs for DME, good support system Seat Belt Safety/Bike Helmet: Wears seat belt.      Objective:     Vitals: BP 117/62   Pulse 66   Resp 18   Ht 5\' 7"  (1.702 m)   Wt 142 lb (64.4 kg)   SpO2 98%   BMI 22.24 kg/m   Body mass index is 22.24 kg/m.  Advanced Directives 06/21/2017 05/25/2017 05/22/2017 08/08/2016 03/24/2015 11/26/2014 09/08/2014  Does Patient Have a Medical Advance Directive? Yes Yes Yes Yes No No No  Type of Paramedic of [place];Living will Healthcare Power of [place];Living will - - - -  Does patient want to make changes to medical advance directive? - No - Patient declined - - - - -  Copy of [place] in Chart? No - copy requested No - copy requested No - copy requested - - - -  Would patient like information on creating a medical advance directive? - - - - No - patient declined information No - patient declined information No - patient declined information  Pre-existing out of facility DNR order (yellow form or pink MOST form) - - - - - - -    Tobacco Social History   Tobacco Use  Smoking Status Former Smoker  . Packs/day: 0.12  . Years: 30.00  . Pack years: 3.60  . Types: Cigarettes  . Last attempt to quit: 01/21/2017  . Years since quitting: 0.4  Smokeless Tobacco Never Used     Counseling given: Not Answered   Past Medical  History:  Diagnosis Date  . Anemia   . Anxiety   . Arthritis   . Claustrophobia   . ESRD (end stage renal disease) on dialysis ([place])    "TTS; [place]" (03/24/2015)  . Glomerulonephritis   . Heart murmur   . HTN (hypertension)   . Hypothyroidism   . Kidney transplant recipient 01/21/2017  . Pancreatitis   . Renal insufficiency   . Secondary hyperparathyroidism ([place])    [name] 05/11/2014   Past Surgical History:  Procedure Laterality Date  . ANTERIOR CERVICAL CORPECTOMY N/A 05/24/2017   Procedure: CORPECTOMY CERVICAL FIVE- CERVICAL SIX;  Surgeon: Ashok Pall, MD;  Location: [facility];  Service: Neurosurgery;  Laterality: N/A;  . ARTERIOVENOUS GRAFT PLACEMENT Right 1990's?  . ARTERIOVENOUS GRAFT PLACEMENT Left 07/2002   upper arm/notes 06/02/2010  . ARTERIOVENOUS GRAFT PLACEMENT Right 07/2003   upper arm/notes 06/02/2010  . AV FISTULA PLACEMENT Left 09/2000   upper arm/notes 06/02/2010  . BREAST BIOPSY Left unsure   benign  . COLONOSCOPY    . DILATATION & CURRETTAGE/HYSTEROSCOPY WITH RESECTOCOPE N/A 04/17/2013   Procedure: DILATATION & CURETTAGE/HYSTEROSCOPY WITH RESECTOCOPE;  Surgeon: Marvene Staff, MD;  Location: [facility];  Service: Gynecology;  Laterality: N/A;  . DILATION AND CURETTAGE OF UTERUS    . EYE SURGERY    . KIDNEY TRANSPLANT  1997  .  KIDNEY TRANSPLANT  01/21/2017   Dr. Harrington Challenger  . PARATHYROIDECTOMY  03/2000 05/12/2014   w/neck exploration & autotransplantation/notes 06/02/2010; w/neck exploration  . PARATHYROIDECTOMY N/A 05/12/2014   Procedure: PARATHYROIDECTOMY AND NECK EXPLORATION;  Surgeon: Armandina Gemma, MD;  Location: [facility];  Service: General;  Laterality: N/A;  . PERITONEAL CATHETER INSERTION    . PERITONEAL CATHETER REMOVAL  08/1999   [name] 06/02/2010  . POSTERIOR CERVICAL FUSION/FORAMINOTOMY N/A 05/30/2017   Procedure: POSTERIOR CERVICAL Arthrodesis Cervical three - four, cervical four - five, cervical five - six, cervical six - seven;  Surgeon:  Ashok Pall, MD;  Location: [facility];  Service: Neurosurgery;  Laterality: N/A;  . POSTERIOR CERVICAL LAMINECTOMY N/A 08/12/2016   Procedure: POSTERIOR CERVICAL LAMINECTOMY CERVICAL THREE CERVICAL FOUR, CERVICAL FOUR CERVICAL FIVE, CERVICAL FIVE- CERVICAL SIX, CERVICAL SIX- CERVICAL SEVEN;  Surgeon: Ashok Pall, MD;  Location: [facility];  Service: Neurosurgery;  Laterality: N/A;  POSTERIOR  . RETINAL DETACHMENT SURGERY Left   . THROMBECTOMY Left 02/2002   fistula/notes 06/02/2010  . THROMBECTOMY / ARTERIOVENOUS GRAFT REVISION  11/2003; 01/2004; 08/07/2005; 08/10/2005; 10/2005   [name] 06/02/2010  . THROMBECTOMY AND REVISION OF ARTERIOVENTOUS (AV) GORETEX  GRAFT Right 01/2002; 11/2003; 01/2004; 08/07/2004; 08/10/2004; 11/13/2005   [name] 5/1/2012Marland Kitchen [name] 5/16/2012Marland Kitchen [name] 5/16/2012Marland Kitchen [name] 5/16/2012Marland Kitchen [name] 06/02/2010; [name] 06/02/2010  . THROMBECTOMY AND REVISION OF ARTERIOVENTOUS (AV) GORETEX  GRAFT Left 08/23/2002; 09/13/2002; 07/08/2003   [name] 06/02/2010; [name] 06/02/2010; [name] 06/02/2010   Family History  Adopted: Yes  Problem Relation Age of Onset  . Diabetes Maternal Aunt        x2  . Diabetes Maternal Uncle    Social History   Socioeconomic History  . Marital status: Divorced    Spouse name: Not on file  . Number of children: 1  . Years of education: Not on file  . Highest education level: Not on file  Occupational History  . Not on file  Social Needs  . Financial resource strain: Not hard at all  . Food insecurity:    Worry: Never true    Inability: Never true  . Transportation needs:    Medical: No    Non-medical: No  Tobacco Use  . Smoking status: Former Smoker    Packs/day: 0.12    Years: 30.00    Pack years: 3.60    Types: Cigarettes    Last attempt to quit: 01/21/2017    Years since quitting: 0.4  . Smokeless tobacco: Never Used  Substance and Sexual Activity  . Alcohol use: No  . Drug use: No  . Sexual activity: Not Currently  Lifestyle  . Physical activity:    Days  per week: 0 days    Minutes per session: 0 min  . Stress: Rather much  Relationships  . Social connections:    Talks on phone: More than three times a week    Gets together: More than three times a week    Attends religious service: More than 4 times per year    Active member of club or organization: Yes    Attends meetings of clubs or organizations: More than 4 times per year    Relationship status: Divorced  Other Topics Concern  . Not on file  Social History Narrative  . Not on file    Outpatient Encounter Medications as of 06/21/2017  Medication Sig  . acetaminophen (TYLENOL) 500 MG tablet Take 1,000 mg by mouth 3 (three) times daily as needed for moderate pain or headache.  Marland Kitchen aspirin  EC 81 MG tablet Take 81 mg by mouth daily.  . folic acid-vitamin b complex-vitamin c-selenium-zinc (DIALYVITE) 3 MG TABS tablet Take 1 tablet by mouth daily.  Marland Kitchen gabapentin (NEURONTIN) 300 MG capsule Take 600 mg by mouth 2 (two) times daily.  . mycophenolate (MYFORTIC) 180 MG EC tablet Take 720 mg by mouth 2 (two) times daily.   Marland Kitchen oxyCODONE (OXY IR/ROXICODONE) 5 MG immediate release tablet Take 1-2 tablets (5-10 mg total) by mouth every 4 (four) hours as needed for moderate pain.  . predniSONE (DELTASONE) 2.5 MG tablet Take 2.5 mg by mouth daily. Take with 5 mg to equal 7.5 mg once daily  . ranitidine (ZANTAC) 150 MG tablet Take 150 mg by mouth at bedtime.  . sodium bicarbonate 650 MG tablet Take 650 mg by mouth 2 (two) times daily.  Marland Kitchen sulfamethoxazole-trimethoprim (BACTRIM,SEPTRA) 400-80 MG tablet Take 1 tablet by mouth every Monday, Wednesday, and Friday.  . tacrolimus (PROGRAF) 0.5 MG capsule Take 0.5 mg by mouth 2 (two) times daily. Take with two 1 mg capsules to equal 2.5 mg twice daily  . tacrolimus (PROGRAF) 1 MG capsule Take 2 mg by mouth 2 (two) times daily. Take with 0.5 mg capsule to equal 2.5 mg twice daily  . [DISCONTINUED] phosphorus (K PHOS NEUTRAL) 155-852-130 MG tablet Take 250 mg by  mouth 2 (two) times daily.  . [DISCONTINUED] Specialty Vitamins Products (MG-PLUS PROTEIN PO) Take 1 tablet by mouth 2 (two) times daily. Take 2 hours after myfortic  . [DISCONTINUED] valGANciclovir (VALCYTE) 450 MG tablet Take 450 mg by mouth daily.   No facility-administered encounter medications on file as of 06/21/2017.     Activities of Daily Living In your present state of health, do you have any difficulty performing the following activities: 06/21/2017 05/25/2017  Hearing? N N  Vision? N N  Difficulty concentrating or making decisions? N N  Walking or climbing stairs? N Y  Dressing or bathing? N N  Doing errands, shopping? N N  Preparing Food and eating ? N -  Using the Toilet? N -  In the past six months, have you accidently leaked urine? N -  Do you have problems with loss of bowel control? N -  Managing your Medications? N -  Managing your Finances? N -  Housekeeping or managing your Housekeeping? N -  Some recent data might be hidden    Patient Care Team: Hoyt Koch, MD as PCP - General (Internal Medicine)    Assessment:   This is a routine wellness examination for Keaira. Physical assessment deferred to PCP.   Exercise Activities and Dietary recommendations Current Exercise Habits: Home exercise routine, Type of exercise: walking(will start PT soon for s/p cervical surgery), Exercise limited by: orthopedic condition(s);neurologic condition(s)  Diet (meal preparation, eat out, water intake, caffeinated beverages, dairy products, fruits and vegetables): in general, a "healthy" diet  , well balanced, eats a variety of fruits and vegetables daily, limits salt, fat/cholesterol, sugar,carbohydrates,caffeine, drinks 6-8 glasses of water daily.  Goals    . Patient Stated     I want to increase my physical activity, walk and start physical therapy to regain strength.      Depression Screen PHQ 2/9 Scores 06/21/2017 06/01/2016  PHQ - 2 Score 1 0  PHQ- 9 Score 3 1      Cognitive Function       Ad8 score reviewed for issues:  Issues making decisions: no  Less interest in hobbies / activities: no  Repeats  questions, stories (family complaining): no  Trouble using ordinary gadgets (microwave, computer, phone):no  Forgets the month or year: no  Mismanaging finances: no  Remembering appts: no  Daily problems with thinking and/or memory: no Ad8 score is= 0    Immunization History  Administered Date(s) Administered  . Influenza-Unspecified 10/15/2016  . Tdap 11/21/2014   Screening Tests Health Maintenance  Topic Date Due  . PAP SMEAR  01/18/2016  . INFLUENZA VACCINE  08/17/2017  . MAMMOGRAM  08/11/2018  . COLONOSCOPY  04/23/2023  . TETANUS/TDAP  11/20/2024  . Hepatitis C Screening  Completed  . HIV Screening  Completed      Plan:   Continue doing brain stimulating activities (puzzles, reading, adult coloring books, staying active) to keep memory sharp.   Continue to eat heart healthy diet (full of fruits, vegetables, whole grains, lean protein, water--limit salt, fat, and sugar intake) and increase physical activity as tolerated.  I have personally reviewed and noted the following in the patient's chart:   . Medical and social history . Use of alcohol, tobacco or illicit drugs  . Current medications and supplements . Functional ability and status . Nutritional status . Physical activity . Advanced directives . List of other physicians . Vitals . Screenings to include cognitive, depression, and falls . Referrals and appointments  In addition, I have reviewed and discussed with patient certain preventive protocols, quality metrics, and best practice recommendations. A written personalized care plan for preventive services as well as general preventive health recommendations were provided to patient.     Michiel Cowboy, RN  06/21/2017   Medical screening examination/treatment/procedure(s) were performed by non-physician  practitioner and as supervising physician I was immediately available for consultation/collaboration. I agree with above. Lew Dawes, MD

## 2017-06-21 ENCOUNTER — Telehealth: Payer: Self-pay | Admitting: *Deleted

## 2017-06-21 ENCOUNTER — Ambulatory Visit (INDEPENDENT_AMBULATORY_CARE_PROVIDER_SITE_OTHER): Payer: Medicare Other | Admitting: *Deleted

## 2017-06-21 VITALS — BP 117/62 | HR 66 | Resp 18 | Ht 67.0 in | Wt 142.0 lb

## 2017-06-21 DIAGNOSIS — Z0000 Encounter for general adult medical examination without abnormal findings: Secondary | ICD-10-CM

## 2017-06-21 NOTE — Patient Instructions (Addendum)
Continue doing brain stimulating activities (puzzles, reading, adult coloring books, staying active) to keep memory sharp.   Continue to eat heart healthy diet (full of fruits, vegetables, whole grains, lean protein, water--limit salt, fat, and sugar intake) and increase physical activity as tolerated.   [name] , Thank you for taking time to come for your Medicare Wellness Visit. I appreciate your ongoing commitment to your health goals. Please review the following plan we discussed and let me know if I can assist you in the future.   These are the goals we discussed: Goals    . Patient Stated     I want to increase my physical activity, walk and start physical therapy to regain strength.       This is a list of the screening recommended for you and due dates:  Health Maintenance  Topic Date Due  . Pap Smear  01/18/2016  . Flu Shot  08/17/2017  . Mammogram  08/11/2018  . Colon Cancer Screening  04/23/2023  . Tetanus Vaccine  11/20/2024  .  Hepatitis C: One time screening is recommended by Center for Disease Control  (CDC) for  adults born from [birth year] through 1965.   Completed  . HIV Screening  Completed     Health Maintenance, Female Adopting a healthy lifestyle and getting preventive care can go a long way to promote health and wellness. Talk with your health care provider about what schedule of regular examinations is right for you. This is a good chance for you to check in with your provider about disease prevention and staying healthy. In between checkups, there are plenty of things you can do on your own. Experts have done a lot of research about which lifestyle changes and preventive measures are most likely to keep you healthy. Ask your health care provider for more information. Weight and diet Eat a healthy diet  Be sure to include plenty of vegetables, fruits, low-fat dairy products, and lean protein.  Do not eat a lot of foods high in solid fats, added sugars, or  salt.  Get regular exercise. This is one of the most important things you can do for your health. ? Most adults should exercise for at least 150 minutes each week. The exercise should increase your heart rate and make you sweat (moderate-intensity exercise). ? Most adults should also do strengthening exercises at least twice a week. This is in addition to the moderate-intensity exercise.  Maintain a healthy weight  Body mass index (BMI) is a measurement that can be used to identify possible weight problems. It estimates body fat based on height and weight. Your health care provider can help determine your BMI and help you achieve or maintain a healthy weight.  For females 69 years of age and older: ? A BMI below 18.5 is considered underweight. ? A BMI of 18.5 to 24.9 is normal. ? A BMI of 25 to 29.9 is considered overweight. ? A BMI of 30 and above is considered obese.  Watch levels of cholesterol and blood lipids  You should start having your blood tested for lipids and cholesterol at 57 years of age, then have this test every 5 years.  You may need to have your cholesterol levels checked more often if: ? Your lipid or cholesterol levels are high. ? You are older than 57 years of age. ? You are at high risk for heart disease.  Cancer screening Lung Cancer  Lung cancer screening is recommended for adults 55-80 years  old who are at high risk for lung cancer because of a history of smoking.  A yearly low-dose CT scan of the lungs is recommended for people who: ? Currently smoke. ? Have quit within the past 15 years. ? Have at least a 30-pack-year history of smoking. A pack year is smoking an average of one pack of cigarettes a day for 1 year.  Yearly screening should continue until it has been 15 years since you quit.  Yearly screening should stop if you develop a health problem that would prevent you from having lung cancer treatment.  Breast Cancer  Practice breast  self-awareness. This means understanding how your breasts normally appear and feel.  It also means doing regular breast self-exams. Let your health care provider know about any changes, no matter how small.  If you are in your 20s or 30s, you should have a clinical breast exam (CBE) by a health care provider every 1-3 years as part of a regular health exam.  If you are 76 or older, have a CBE every year. Also consider having a breast X-ray (mammogram) every year.  If you have a family history of breast cancer, talk to your health care provider about genetic screening.  If you are at high risk for breast cancer, talk to your health care provider about having an MRI and a mammogram every year.  Breast cancer gene (BRCA) assessment is recommended for women who have family members with BRCA-related cancers. BRCA-related cancers include: ? Breast. ? Ovarian. ? Tubal. ? Peritoneal cancers.  Results of the assessment will determine the need for genetic counseling and BRCA1 and BRCA2 testing.  Cervical Cancer Your health care provider may recommend that you be screened regularly for cancer of the pelvic organs (ovaries, uterus, and vagina). This screening involves a pelvic examination, including checking for microscopic changes to the surface of your cervix (Pap test). You may be encouraged to have this screening done every 3 years, beginning at age 66.  For women ages 65-65, health care providers may recommend pelvic exams and Pap testing every 3 years, or they may recommend the Pap and pelvic exam, combined with testing for human papilloma virus (HPV), every 5 years. Some types of HPV increase your risk of cervical cancer. Testing for HPV may also be done on women of any age with unclear Pap test results.  Other health care providers may not recommend any screening for nonpregnant women who are considered low risk for pelvic cancer and who do not have symptoms. Ask your health care provider if a  screening pelvic exam is right for you.  If you have had past treatment for cervical cancer or a condition that could lead to cancer, you need Pap tests and screening for cancer for at least 20 years after your treatment. If Pap tests have been discontinued, your risk factors (such as having a new sexual partner) need to be reassessed to determine if screening should resume. Some women have medical problems that increase the chance of getting cervical cancer. In these cases, your health care provider may recommend more frequent screening and Pap tests.  Colorectal Cancer  This type of cancer can be detected and often prevented.  Routine colorectal cancer screening usually begins at 57 years of age and continues through 57 years of age.  Your health care provider may recommend screening at an earlier age if you have risk factors for colon cancer.  Your health care provider may also recommend using home test  kits to check for hidden blood in the stool.  A small camera at the end of a tube can be used to examine your colon directly (sigmoidoscopy or colonoscopy). This is done to check for the earliest forms of colorectal cancer.  Routine screening usually begins at age 6.  Direct examination of the colon should be repeated every 5-10 years through 57 years of age. However, you may need to be screened more often if early forms of precancerous polyps or small growths are found.  Skin Cancer  Check your skin from head to toe regularly.  Tell your health care provider about any new moles or changes in moles, especially if there is a change in a mole's shape or color.  Also tell your health care provider if you have a mole that is larger than the size of a pencil eraser.  Always use sunscreen. Apply sunscreen liberally and repeatedly throughout the day.  Protect yourself by wearing long sleeves, pants, a wide-brimmed hat, and sunglasses whenever you are outside.  Heart disease, diabetes, and  high blood pressure  High blood pressure causes heart disease and increases the risk of stroke. High blood pressure is more likely to develop in: ? People who have blood pressure in the high end of the normal range (130-139/85-89 mm Hg). ? People who are overweight or obese. ? People who are African American.  If you are 86-17 years of age, have your blood pressure checked every 3-5 years. If you are 6 years of age or older, have your blood pressure checked every year. You should have your blood pressure measured twice-once when you are at a hospital or clinic, and once when you are not at a hospital or clinic. Record the average of the two measurements. To check your blood pressure when you are not at a hospital or clinic, you can use: ? An automated blood pressure machine at a pharmacy. ? A home blood pressure monitor.  If you are between 37 years and 9 years old, ask your health care provider if you should take aspirin to prevent strokes.  Have regular diabetes screenings. This involves taking a blood sample to check your fasting blood sugar level. ? If you are at a normal weight and have a low risk for diabetes, have this test once every three years after 57 years of age. ? If you are overweight and have a high risk for diabetes, consider being tested at a younger age or more often. Preventing infection Hepatitis B  If you have a higher risk for hepatitis B, you should be screened for this virus. You are considered at high risk for hepatitis B if: ? You were born in a country where hepatitis B is common. Ask your health care provider which countries are considered high risk. ? Your parents were born in a high-risk country, and you have not been immunized against hepatitis B (hepatitis B vaccine). ? You have HIV or AIDS. ? You use needles to inject street drugs. ? You live with someone who has hepatitis B. ? You have had sex with someone who has hepatitis B. ? You get hemodialysis  treatment. ? You take certain medicines for conditions, including cancer, organ transplantation, and autoimmune conditions.  Hepatitis C  Blood testing is recommended for: ? Everyone born from [birth year] through 1965. ? Anyone with known risk factors for hepatitis C.  Sexually transmitted infections (STIs)  You should be screened for sexually transmitted infections (STIs) including gonorrhea and chlamydia if: ?  You are sexually active and are younger than 57 years of age. ? You are older than 57 years of age and your health care provider tells you that you are at risk for this type of infection. ? Your sexual activity has changed since you were last screened and you are at an increased risk for chlamydia or gonorrhea. Ask your health care provider if you are at risk.  If you do not have HIV, but are at risk, it may be recommended that you take a prescription medicine daily to prevent HIV infection. This is called pre-exposure prophylaxis (PrEP). You are considered at risk if: ? You are sexually active and do not regularly use condoms or know the HIV status of your partner(s). ? You take drugs by injection. ? You are sexually active with a partner who has HIV.  Talk with your health care provider about whether you are at high risk of being infected with HIV. If you choose to begin PrEP, you should first be tested for HIV. You should then be tested every 3 months for as long as you are taking PrEP. Pregnancy  If you are premenopausal and you may become pregnant, ask your health care provider about preconception counseling.  If you may become pregnant, take 400 to 800 micrograms (mcg) of folic acid every day.  If you want to prevent pregnancy, talk to your health care provider about birth control (contraception). Osteoporosis and menopause  Osteoporosis is a disease in which the bones lose minerals and strength with aging. This can result in serious bone fractures. Your risk for osteoporosis  can be identified using a bone density scan.  If you are 68 years of age or older, or if you are at risk for osteoporosis and fractures, ask your health care provider if you should be screened.  Ask your health care provider whether you should take a calcium or vitamin D supplement to lower your risk for osteoporosis.  Menopause may have certain physical symptoms and risks.  Hormone replacement therapy may reduce some of these symptoms and risks. Talk to your health care provider about whether hormone replacement therapy is right for you. Follow these instructions at home:  Schedule regular health, dental, and eye exams.  Stay current with your immunizations.  Do not use any tobacco products including cigarettes, chewing tobacco, or electronic cigarettes.  If you are pregnant, do not drink alcohol.  If you are breastfeeding, limit how much and how often you drink alcohol.  Limit alcohol intake to no more than 1 drink per day for nonpregnant women. One drink equals 12 ounces of beer, 5 ounces of wine, or 1 ounces of hard liquor.  Do not use street drugs.  Do not share needles.  Ask your health care provider for help if you need support or information about quitting drugs.  Tell your health care provider if you often feel depressed.  Tell your health care provider if you have ever been abused or do not feel safe at home. This information is not intended to replace advice given to you by your health care provider. Make sure you discuss any questions you have with your health care provider. Document Released: 07/19/2010 Document Revised: 06/11/2015 Document Reviewed: 10/07/2014 Elsevier Interactive Patient Education  [employer].

## 2017-06-21 NOTE — Telephone Encounter (Signed)
During AWV, patient stated that she would like to have a  prescription for xanax 0.25 mg. 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 This medicine has helped in the past.

## 2017-06-22 ENCOUNTER — Encounter: Payer: Self-pay | Admitting: Occupational Therapy

## 2017-06-22 ENCOUNTER — Ambulatory Visit: Payer: Medicare Other | Admitting: Occupational Therapy

## 2017-06-22 ENCOUNTER — Telehealth: Payer: Self-pay | Admitting: Rehabilitation

## 2017-06-22 ENCOUNTER — Ambulatory Visit: Payer: Medicare Other | Attending: Internal Medicine | Admitting: Rehabilitation

## 2017-06-22 ENCOUNTER — Other Ambulatory Visit: Payer: Self-pay

## 2017-06-22 ENCOUNTER — Encounter: Payer: Self-pay | Admitting: Rehabilitation

## 2017-06-22 DIAGNOSIS — R278 Other lack of coordination: Secondary | ICD-10-CM | POA: Insufficient documentation

## 2017-06-22 DIAGNOSIS — R2681 Unsteadiness on feet: Secondary | ICD-10-CM

## 2017-06-22 DIAGNOSIS — R2689 Other abnormalities of gait and mobility: Secondary | ICD-10-CM | POA: Insufficient documentation

## 2017-06-22 DIAGNOSIS — M6281 Muscle weakness (generalized): Secondary | ICD-10-CM | POA: Insufficient documentation

## 2017-06-22 DIAGNOSIS — M542 Cervicalgia: Secondary | ICD-10-CM | POA: Diagnosis not present

## 2017-06-22 NOTE — Therapy (Signed)
[facility] [address] [place], [place], [address] Phone: [phone]   Fax:  [phone]  Physical Therapy Treatment  Patient Details  Name: [name] MRN: [id number] Date of Birth: 1960/12/29 Referring Provider: Dr. Ashok Pall   Encounter Date: 06/22/2017  PT End of Session - 06/22/17 1210    Visit Number  1    Number of Visits  17    Date for PT Re-Evaluation  08/21/17    Authorization Type  Medicare    PT Start Time  0848    PT Stop Time  0932    PT Time Calculation (min)  44 min    Activity Tolerance  Patient tolerated treatment well    Behavior During Therapy  [facility] for tasks assessed/performed       Past Medical History:  Diagnosis Date  . Anemia   . Anxiety   . Arthritis   . Claustrophobia   . ESRD (end stage renal disease) on dialysis ([place])    "TTS; [place]" (03/24/2015)  . Glomerulonephritis   . Heart murmur   . HTN (hypertension)   . Hypothyroidism   . Kidney transplant recipient 01/21/2017  . Pancreatitis   . Renal insufficiency   . Secondary hyperparathyroidism ([place])    [name] 05/11/2014    Past Surgical History:  Procedure Laterality Date  . ANTERIOR CERVICAL CORPECTOMY N/A 05/24/2017   Procedure: CORPECTOMY CERVICAL FIVE- CERVICAL SIX;  Surgeon: Ashok Pall, MD;  Location: [facility];  Service: Neurosurgery;  Laterality: N/A;  . ARTERIOVENOUS GRAFT PLACEMENT Right 1990's?  . ARTERIOVENOUS GRAFT PLACEMENT Left 07/2002   upper arm/notes 06/02/2010  . ARTERIOVENOUS GRAFT PLACEMENT Right 07/2003   upper arm/notes 06/02/2010  . AV FISTULA PLACEMENT Left 09/2000   upper arm/notes 06/02/2010  . BREAST BIOPSY Left unsure   benign  . COLONOSCOPY    . DILATATION & CURRETTAGE/HYSTEROSCOPY WITH RESECTOCOPE N/A 04/17/2013   Procedure: DILATATION & CURETTAGE/HYSTEROSCOPY WITH RESECTOCOPE;  Surgeon: Marvene Staff, MD;  Location: [facility];  Service: Gynecology;  Laterality: N/A;  .  DILATION AND CURETTAGE OF UTERUS    . EYE SURGERY    . KIDNEY TRANSPLANT  1997  . KIDNEY TRANSPLANT  01/21/2017   Dr. Harrington Challenger  . PARATHYROIDECTOMY  03/2000 05/12/2014   w/neck exploration & autotransplantation/notes 06/02/2010; w/neck exploration  . PARATHYROIDECTOMY N/A 05/12/2014   Procedure: PARATHYROIDECTOMY AND NECK EXPLORATION;  Surgeon: Armandina Gemma, MD;  Location: [facility];  Service: General;  Laterality: N/A;  . PERITONEAL CATHETER INSERTION    . PERITONEAL CATHETER REMOVAL  08/1999   [name] 06/02/2010  . POSTERIOR CERVICAL FUSION/FORAMINOTOMY N/A 05/30/2017   Procedure: POSTERIOR CERVICAL Arthrodesis Cervical three - four, cervical four - five, cervical five - six, cervical six - seven;  Surgeon: Ashok Pall, MD;  Location: [facility];  Service: Neurosurgery;  Laterality: N/A;  . POSTERIOR CERVICAL LAMINECTOMY N/A 08/12/2016   Procedure: POSTERIOR CERVICAL LAMINECTOMY CERVICAL THREE CERVICAL FOUR, CERVICAL FOUR CERVICAL FIVE, CERVICAL FIVE- CERVICAL SIX, CERVICAL SIX- CERVICAL SEVEN;  Surgeon: Ashok Pall, MD;  Location: [facility];  Service: Neurosurgery;  Laterality: N/A;  POSTERIOR  . RETINAL DETACHMENT SURGERY Left   . THROMBECTOMY Left 02/2002   fistula/notes 06/02/2010  . THROMBECTOMY / ARTERIOVENOUS GRAFT REVISION  11/2003; 01/2004; 08/07/2005; 08/10/2005; 10/2005   [name] 06/02/2010  . THROMBECTOMY AND REVISION OF ARTERIOVENTOUS (AV) GORETEX  GRAFT Right 01/2002; 11/2003; 01/2004; 08/07/2004; 08/10/2004; 11/13/2005   [name] 5/1/2012Marland Kitchen [name] 5/16/2012Marland Kitchen [name] 5/16/2012Marland Kitchen [name] 06/02/2010; [name] 06/02/2010; /  notes 06/02/2010  . THROMBECTOMY AND REVISION OF ARTERIOVENTOUS (AV) GORETEX  GRAFT Left 08/23/2002; 09/13/2002; 07/08/2003   [name] 06/02/2010; [name] 06/02/2010; [name] 06/02/2010    There were no vitals filed for this visit.  Subjective Assessment - 06/22/17 0851    Subjective  Pt is s/p cervical corpectomy and posterior arthodesis.  Pt reports she would like to work on the strength in her legs.   "Sometimes my right leg drags, when it does I stop."     Pertinent History  s/p kidney transplant 01/21/17    Limitations  House hold activities;Walking    How long can you walk comfortably?  Is able to ambulate approx 5-15 mins before she notices R foot dragging.     Patient Stated Goals  "I want to be able to walk without feeling like I"m getting ready to fall."    Currently in Pain?  Yes    Pain Score  8     Pain Location  Neck    Pain Orientation  Posterior    Pain Descriptors / Indicators  Aching    Pain Type  Surgical pain    Pain Onset  1 to 4 weeks ago    Pain Frequency  Constant    Aggravating Factors   sitting up, esp without brace    Pain Relieving Factors  pain medication          [facility] PT Assessment - 06/22/17 0001      Assessment   Medical Diagnosis  s/p C5/6/7 corpectomy and posterior arthrodesis C3-T1    Referring Provider  Ashok Pall, MD    Onset Date/Surgical Date  05/24/17    Hand Dominance  Right    Prior Therapy  Acute PT/OT      Precautions   Precautions  Cervical    Required Braces or Orthoses  Cervical Brace no lifting (unsure of lbs), no overhead lifting    Cervical Brace  Hard collar;For comfort      Restrictions   Weight Bearing Restrictions  No    Other Position/Activity Restrictions  see above      Balance Screen   Has the patient fallen in the past 6 months  Yes    How many times?  1    Has the patient had a decrease in activity level because of a fear of falling?   No    Is the patient reluctant to leave their home because of a fear of falling?   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    Leisure  Likes to read,-goes to [employer], travel (to [employer] [employer])      Editor, commissioning  Impaired Detail  Light Touch Impaired Details  Impaired RUE;Impaired LUE n/t in B hands (R>L)    Hot/Cold  Appears Intact    Proprioception  Appears Intact      Coordination   Gross Motor Movements are Fluid and Coordinated  Yes in LEs    Fine Motor Movements are Fluid and Coordinated  Yes    Heel Shin Test  WFL      ROM / Strength   AROM / PROM / Strength  Strength      Strength   Overall Strength  Deficits    Overall Strength Comments  RLE: hip flex (seated) 3+/5, knee ext 4/5, knee flex 3+/5, ankle DF 3+/5.  LLE:  hip flex (seated) 4/5, knee ext 4/5, knee flex 3+/5, ankle DF 4/5.  Standing PF strength (unable to lift on RLE, slight elevation on LLE)      Transfers   Transfers  Sit to Stand;Stand to Sit    Sit to Stand  6: Modified independent (Device/Increase time)    Five time sit to stand comments   19.62 secs without UE support    Stand to Sit  6: Modified independent (Device/Increase time)      Ambulation/Gait   Ambulation/Gait  Yes    Ambulation/Gait Assistance  5: Supervision;4: Min guard min/guard with balance challenges    Ambulation/Gait Assistance Details  Note that with increased gait distance, she demonstrates poor control in RLE, and note increased foot slap (decreased eccentric DF control) during stance phase of gait.     Ambulation Distance (Feet)  300 Feet    Assistive device  None    Gait Pattern  Step-through pattern;Decreased arm swing - right;Decreased arm swing - left;Decreased stride length;Decreased dorsiflexion - right;Decreased dorsiflexion - left;Right foot flat;Right flexed knee in stance;Trunk flexed     Ambulation Surface  Level;Indoor    Gait velocity  3.00 ft/sec without device    Stairs  Yes    Stairs Assistance  6: Modified independent (Device/Increase time)    Stair Management Technique  One rail Right;Alternating pattern;Forwards    Number of Stairs  4    Height of Stairs  6      Functional Gait  Assessment   Gait assessed   Yes    Gait Level Surface  Walks 20 ft in less than 7 sec but greater than 5.5 sec, uses assistive device, slower speed, mild gait deviations, or deviates 6-10 in outside of the 12 in walkway width.    Change in Gait Speed  Able to smoothly change walking speed without loss of balance or gait deviation. Deviate no more than 6 in outside of the 12 in walkway width.    Gait with Horizontal Head Turns  Performs head turns smoothly with slight change in gait velocity (eg, minor disruption to smooth gait path), deviates 6-10 in outside 12 in walkway width, or uses an assistive device.    Gait with Vertical Head Turns  Performs task with slight change in gait velocity (eg, minor disruption to smooth gait path), deviates 6 - 10 in outside 12 in walkway width or uses assistive device    Gait and Pivot Turn  Pivot turns safely in greater than 3 sec and stops with no loss of balance, or pivot turns safely within 3 sec and stops with mild imbalance, requires small steps to catch balance.    Step Over Obstacle  Is able to step over one shoe box (4.5 in total height) but must slow down and adjust  steps to clear box safely. May require verbal cueing.    Gait with Narrow Base of Support  Ambulates less than 4 steps heel to toe or cannot perform without assistance.    Gait with Eyes Closed  Walks 20 ft, slow speed, abnormal gait pattern, evidence for imbalance, deviates 10-15 in outside 12 in walkway width. Requires more than 9 sec to ambulate 20 ft.    Ambulating Backwards  Walks 20 ft, slow speed, abnormal gait pattern, evidence for imbalance, deviates 10-15 in outside 12 in walkway  width.    Steps  Alternating feet, must use rail.    Total Score  16    FGA comment:  < 19 = high risk fall                           PT Education - 06/22/17 1210    Education Details  Education on evaluation findings, POC, goals    Person(s) Educated  Patient    Methods  Explanation    Comprehension  Verbalized understanding       PT Short Term Goals - 06/22/17 1228      PT SHORT TERM GOAL #1   Title  Pt will be independent with initial HEP in order to indicate improved functional mobility and decreased fall risk.  (Target Date: 07/22/17)    Time  4    Period  Weeks    Status  New    Target Date  07/22/17      PT SHORT TERM GOAL #2   Title  Pt will improve 5TSS to </=16.50 secs without UE support in order to indicate decreased fall risk and improved functional strength.      Time  4    Period  Weeks    Status  New      PT SHORT TERM GOAL #3   Title  Pt will improve FGA to 19/30 in order to indicate decreased fall risk.      Time  4    Period  Weeks    Status  New      PT SHORT TERM GOAL #4   Title  Pt will ambulate at gait speed of >/=2.62 ft/sec with decreased indication of R foot slap/overt gait deviations indicating safe gait speed.     Time  4    Period  Weeks    Status  New      PT SHORT TERM GOAL #5   Title  Pt will ambulate outdoors over unlevel paved surfaces up to 1000' at mod I level in order to indicate improved community independence.     Time  4    Period  Weeks    Status  New        PT Long Term Goals - 06/22/17 1232      PT LONG TERM GOAL #1   Title  Pt will be independent with final HEP in order to indicate improved functional mobility and decreased fall risk.  (Target Date: 08/21/17)    Time  8    Period  Weeks    Status  New    Target Date  08/21/17      PT LONG TERM GOAL #2   Title  Pt will improve 5TSS to </=13.50 secs without UE support in order to indicate improved functional strength and decreased fall risk.      Time  8    Period  Weeks    Status  New      PT LONG TERM GOAL #3   Title  Pt will improve FGA to >/=23/30 in order to indicate decreased fall risk.      Time  8    Period  Weeks    Status  New      PT LONG TERM GOAL #4   Title  Pt will ambulate >1000' over varying outdoor surfaces, scanning environment at independent level in order to indicate safe community and leisure mobility.     Time  8    Period  Weeks    Status  New            Plan - 06/22/17 1211    Clinical Impression Statement  Pt presents with history of C3-C6 posterior decompression, however this had failed and pt with new cord compression.  She is now s/p C5/6/7 anterior corpectomy (with strut graft and anterior plating) and posterior C3-T1 arthrodesis on 05/24/17.  Note she also has history of renal disease with recent kidney transplant in January 2019 and also has hx of HTN that could impact progress with therapy.  Upon PT evaluation, pt presents wearing hard cervical collar ([place]) however only needs to wear for comfort).  She has lifting restrictions but is unsure of exact precautions.  PT to send message to MD regarding this manner.  Upon testing, note 5TSS time of 19.62 secs without UE support indicative of decreased functional strength and elevated fall risk, FGA score of 16/30 indiative of high fall risk and gait speed of 3.00 ft/sec, however with gait deviations that could lead to falling (ie foot slap on RLE).  Pt will benefit from skilled OP neuro PT in order to address deficits.      History and Personal Factors relevant to plan of care:  see above    Clinical Presentation  Evolving    Clinical Presentation due to:  see above    Clinical Decision Making  Moderate    Rehab Potential  Good    Clinical Impairments Affecting Rehab Potential  severity of cord compression     PT Frequency  2x / week    PT Duration  8 weeks    PT Treatment/Interventions  ADLs/Self Care Home Management;Electrical Stimulation;Gait  training;Stair training;Functional mobility training;Therapeutic activities;Therapeutic exercise;Balance training;Neuromuscular re-education;Patient/family education;Orthotic Fit/Training;Passive range of motion;Manual techniques    PT Next Visit Plan  See if heard from MD regarding specific restrictions/precautions, all therapy without cervical collar, initiate HEP for LE strength and balance, gentle neck ROM as she is driving    Consulted and Agree with Plan of Care  Patient       Patient will benefit from skilled therapeutic intervention in order to improve the following deficits and impairments:  Abnormal gait, Decreased activity tolerance, Decreased balance, Decreased endurance, Decreased mobility, Decreased range of motion, Decreased strength, Hypomobility, Impaired perceived functional ability, Impaired flexibility, Postural dysfunction, Improper body mechanics, Impaired sensation  Visit Diagnosis: Unsteadiness on feet  Muscle weakness (generalized)  Other abnormalities of gait and mobility  Cervicalgia     Problem List Patient Active Problem List   Diagnosis Date Noted  . 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 S/P spinal surgery 05/24/2017  . Other secondary kyphosis, cervical region 05/24/2017  . Anxiety 11/21/2016  . Stenosis of cervical spine with myelopathy ([place]) 08/12/2016  . Routine general medical examination at a health care facility 06/01/2016  . Numbness and tingling in both hands 05/18/2015  . Hypothyroidism 03/24/2015  . History of tachycardia 03/24/2015  .  HTN (hypertension), benign 06/12/2013  . ESRD on dialysis [facility]) 06/12/2013    Cameron Sprang, PT, MPT [facility] [address] [place], [place], [address] Phone: [phone]   Fax:  [phone] 06/22/17, 12:35 PM  Name: [name] MRN: [id number] Date of Birth: [birth year]

## 2017-06-22 NOTE — Telephone Encounter (Signed)
Dr. Christella Noa,   I am seeing Ms. [name] at OP neuro for PT (she is also receiving OT).  Note that she is unaware of any lifting or ROM restrictions that she may have.  I was hoping that you could indicative specific lifting restrictions (lbs) and does she have over head restrictions?  Can we do manual therapy of soft tissue at cervical spine?   Thanks,  Cameron Sprang, PT, MPT [id number] [facility] [address] [place], [place], [address] Phone: [phone]   Fax:  [phone] 06/22/17, 1:05 PM

## 2017-06-22 NOTE — Therapy (Signed)
[facility] [address] [place], [place], [address] Phone: [phone]   Fax:  [phone]  Occupational Therapy Evaluation  Patient Details  Name: [name] MRN: [id number] Date of Birth: Jul 08, 1960 Referring Provider: Dr. Ashok Pall   Encounter Date: 06/22/2017  OT End of Session - 06/22/17 1246    Visit Number  1    Number of Visits  17    Date for OT Re-Evaluation  09/20/17    Authorization Type  Medicare $3000 visit limt    Authorization Time Period  90 day cert. period  (through 09/20/17)    OT Start Time  0935    OT Stop Time  1015    OT Time Calculation (min)  40 min    Activity Tolerance  Patient tolerated treatment well    Behavior During Therapy  [facility] for tasks assessed/performed       Past Medical History:  Diagnosis Date  . Anemia   . Anxiety   . Arthritis   . Claustrophobia   . ESRD (end stage renal disease) on dialysis ([place])    "TTS; [place]" (03/24/2015)  . Glomerulonephritis   . Heart murmur   . HTN (hypertension)   . Hypothyroidism   . Kidney transplant recipient 01/21/2017  . Pancreatitis   . Renal insufficiency   . Secondary hyperparathyroidism ([place])    [name] 05/11/2014    Past Surgical History:  Procedure Laterality Date  . ANTERIOR CERVICAL CORPECTOMY N/A 05/24/2017   Procedure: CORPECTOMY CERVICAL FIVE- CERVICAL SIX;  Surgeon: Ashok Pall, MD;  Location: [facility];  Service: Neurosurgery;  Laterality: N/A;  . ARTERIOVENOUS GRAFT PLACEMENT Right 1990's?  . ARTERIOVENOUS GRAFT PLACEMENT Left 07/2002   upper arm/notes 06/02/2010  . ARTERIOVENOUS GRAFT PLACEMENT Right 07/2003   upper arm/notes 06/02/2010  . AV FISTULA PLACEMENT Left 09/2000   upper arm/notes 06/02/2010  . BREAST BIOPSY Left unsure   benign  . COLONOSCOPY    . DILATATION & CURRETTAGE/HYSTEROSCOPY WITH RESECTOCOPE N/A 04/17/2013   Procedure: DILATATION & CURETTAGE/HYSTEROSCOPY WITH RESECTOCOPE;  Surgeon:  Marvene Staff, MD;  Location: [facility];  Service: Gynecology;  Laterality: N/A;  . DILATION AND CURETTAGE OF UTERUS    . EYE SURGERY    . KIDNEY TRANSPLANT  1997  . KIDNEY TRANSPLANT  01/21/2017   Dr. Harrington Challenger  . PARATHYROIDECTOMY  03/2000 05/12/2014   w/neck exploration & autotransplantation/notes 06/02/2010; w/neck exploration  . PARATHYROIDECTOMY N/A 05/12/2014   Procedure: PARATHYROIDECTOMY AND NECK EXPLORATION;  Surgeon: Armandina Gemma, MD;  Location: [facility];  Service: General;  Laterality: N/A;  . PERITONEAL CATHETER INSERTION    . PERITONEAL CATHETER REMOVAL  08/1999   [name] 06/02/2010  . POSTERIOR CERVICAL FUSION/FORAMINOTOMY N/A 05/30/2017   Procedure: POSTERIOR CERVICAL Arthrodesis Cervical three - four, cervical four - five, cervical five - six, cervical six - seven;  Surgeon: Ashok Pall, MD;  Location: [facility];  Service: Neurosurgery;  Laterality: N/A;  . POSTERIOR CERVICAL LAMINECTOMY N/A 08/12/2016   Procedure: POSTERIOR CERVICAL LAMINECTOMY CERVICAL THREE CERVICAL FOUR, CERVICAL FOUR CERVICAL FIVE, CERVICAL FIVE- CERVICAL SIX, CERVICAL SIX- CERVICAL SEVEN;  Surgeon: Ashok Pall, MD;  Location: [facility];  Service: Neurosurgery;  Laterality: N/A;  POSTERIOR  . RETINAL DETACHMENT SURGERY Left   . THROMBECTOMY Left 02/2002   fistula/notes 06/02/2010  . THROMBECTOMY / ARTERIOVENOUS GRAFT REVISION  11/2003; 01/2004; 08/07/2005; 08/10/2005; 10/2005   [name] 06/02/2010  . THROMBECTOMY AND REVISION OF ARTERIOVENTOUS (AV) GORETEX  GRAFT Right 01/2002;  11/2003; 01/2004; 08/07/2004; 08/10/2004; 11/13/2005   [name] 5/1/2012Marland Kitchen [name] 5/16/2012Marland Kitchen [name] 5/16/2012Marland Kitchen [name] 5/16/2012Marland Kitchen [name] 06/02/2010; [name] 06/02/2010  . THROMBECTOMY AND REVISION OF ARTERIOVENTOUS (AV) GORETEX  GRAFT Left 08/23/2002; 09/13/2002; 07/08/2003   [name] 06/02/2010; [name] 06/02/2010; [name] 06/02/2010    There were no vitals filed for this visit.  Subjective Assessment - 06/22/17 0938    Subjective   Pt reports that she will call MD  office today    Pertinent History  s/p C5/6/7 anterior corpectomy (with strut graft and anterior plating) and posterior C3-T1 arthrodesis on 05/24/17.  Pt with PMH that includes:  Arthritis, ESRD, s/p kidney transplant 01/21/17, HTN, Hypotyroidism, heart murmur, anxiety, history of C3-C6 posterior decompression 2018    Limitations  ?cervical precautions (pt reports brace for comfort, no lifting (unsure of # limit), and no overhead reaching)    Patient Stated Goals  improve coordination/strength UEs, return to work    Currently in Pain?  Yes    Pain Score  8     Pain Location  Neck    Pain Orientation  Medial    Pain Descriptors / Indicators  Aching    Pain Type  Surgical pain    Pain Onset  1 to 4 weeks ago    Pain Frequency  Constant    Aggravating Factors   fatigue, too much activity    Pain Relieving Factors  pain medications, lying    Effect of Pain on Daily Activities  OT will monitor as relates to treatment but will not directly address due to nature.        [facility] OT Assessment - 06/22/17 0942      Assessment   Medical Diagnosis  s/p C5/6/7 corpectomy and posterior arthrodesis C3-T1    Referring Provider  Dr. Ashok Pall    Onset Date/Surgical Date  05/24/08    Hand Dominance  Right    Next MD Visit  6 weeks    Prior Therapy  Acute PT/OT      Precautions   Precautions  Cervical    Required Braces or Orthoses  Cervical Brace no lifting (not sure of lbs), no overhead movement    Cervical Brace  Hard collar;For comfort      Balance Screen   Has the patient fallen in the past 6 months  Yes    How many times?  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    Leisure  Engineering geologist  to read,-goes to [employer], travel (to [place] [employer])      ADL   Eating/Feeding  Modified independent    Upper Body Bathing  Modified independent    Lower Body Bathing  Modified independent    Upper Body Dressing  -- mod I, difficulty with earrings    Lower Body Dressing  Modified independent difficulty tying tight, doesn't stay tied    Toilet Transfer  Modified independent    Toileting - Clothing Manipulation  Modified independent difficulty zipping    [place] has to wipe multiple times to be clean    Clinical cytogeneticist  Modified independent    Warden/ranger  -- seat present, didn't use    ADL comments  Mother lives with pt.  Pt does not have to physically assist mom but does need to monitor her due to cognitive deficits s/p CVA.      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, no highway      Mobility   Mobility Status  Independent see PT for details      Written Expression   Dominant Hand  Right    Handwriting  100% legible;Increased time difficulty taking off pen cap      Sensation   Light Touch  Impaired Detail    Additional Comments  Pt reports numbness and tingling in fingertips of both hands, RUE>LUE  as well as hypersensitivity      Coordination   9 Hole Peg Test  Right;Left    Right 9 Hole Peg Test  24.44    Left 9 Hole Peg Test  29.47      ROM / Strength   AROM / PROM / Strength  AROM;Strength      AROM   Overall AROM   Deficits    Overall AROM Comments  Pt with approx 75% shoulder  flex prior to compensation, ER/IR grossly [facility]      Strength   Overall Strength  Deficits    Overall Strength Comments  -- did not formally test shoulder strength due to precautions      Hand Function   Right Hand Grip (lbs)  35    Right Hand Lateral Pinch  7 lbs    Right Hand 3 Point Pinch  7 lbs    Left Hand Grip (lbs)  32    Left Hand Lateral Pinch  6 lbs    Left 3 point pinch  5 lbs                      OT Education - 06/22/17 1241    Education Details  OT POC/Eval results; questions to clarify with surgeon regarding precautions (lifting, overhead reaching, ability to do light low-range strengthening)--recommended avoiding repetitive overhead reaching, lifting, vacuuming, pushing/pulling until clarifies with MD    Person(s) Educated  Patient    Methods  Explanation    Comprehension  Verbalized understanding       OT Short Term Goals - 06/22/17 1351      OT SHORT TERM GOAL #1   Title  Pt will be independent with initial HEP.--check STGs 07/22/17    Time  4    Period  Weeks    Status  New      OT SHORT TERM GOAL #2   Title  Pt will improve coordination for ADLs as shown by completing 9-hole peg test in less than 22sec with R hand and 24sec with L hand.     Baseline  R-24.44, L-29.47sec    Time  4    Period  Weeks  Status  New      OT SHORT TERM GOAL #3   Title  Pt will be able to perform overhead reaching to retrieve light object without compensation (if cleared by physician).    Baseline  75% with compensation    Time  4    Period  Weeks    Status  New      OT SHORT TERM GOAL #4   Title  Pt will improve bilateral grip strength by at least 5lbs to assist with ADLs (opening containers, gripping).    Baseline  R-35lbs, L-32lbs    Time  4    Period  Weeks    Status  New      OT SHORT TERM GOAL #5   Title  Pt will improve lateral pinch strength by at least 4lbs bilaterally for work tasks/ADLs.    Baseline  R-7lbs, L-6lbs    Time  4    Period  Weeks     Status  New        OT Long Term Goals - 06/22/17 1507      OT LONG TERM GOAL #1   Title  Pt will be independent with updated HEP.--check LTGs 08/22/17    Time  12    Period  Weeks    Status  New      OT LONG TERM GOAL #2   Title  Pt will be able to retrieve at least 2lb object from overhead shelf with each UE safely.    Time  12    Period  Weeks    Status  New      OT LONG TERM GOAL #3   Title  Pt will perform simulated fine motor work tasks (typing, writing, preparing slides, etc) with min difficulty/sufficient for return to work.    Time  12    Period  Weeks    Status  New      OT LONG TERM GOAL #4   Title  Pt will improve 3point pinch strength by at least 4lbs bilaterally for work tasks/ADLs.    Baseline  R-7bs, L-6lbs    Time  12    Period  Weeks    Status  New      OT LONG TERM GOAL #5   Title  Pt will improve bilateral grip strength by at least 10lbs to assist with ADLs (opening containers, gripping).    Baseline  R-35lbs, L-32lbs    Time  12    Period  Weeks    Status  New      OT LONG TERM GOAL #6   Title  Pt will be able to perform mod complex IADLs (vacuuming, carrying heavier groceries, etc).  mod I.    Time  12    Period  Weeks    Status  New      OT LONG TERM GOAL #7   Title  --    Baseline  --    Time  --    Period  --    Status  --            Plan - 06/22/17 1330    Clinical Impression Statement  Pt is a 57 y.o. female s/p C5/6/7 anterior corpectomy (with strut graft and anterior plating) and posterior C3-T1 arthrodesis on 05/24/17.  Pt with PMH that includes:  Arthritis, ESRD, s/p kidney transplant 01/21/17, HTN, Hypotyroidism, heart murmur, anxiety, history of C3-C6 posterior decompression 2018.  Pt presents with decr strength, decr coordination,  decr UE functional use, decr functional mobility/balance for ADLs.  Pt would benefit from occupational therapy to address these deficits for improved ADL/IADL performance and return to work.       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      Occupational performance deficits (Please refer to evaluation for details):  ADL's;IADL's;Work;Leisure    Rehab Potential  Good    OT Frequency  2x / week    OT Duration  12 weeks +eval    OT Treatment/Interventions  Self-care/ADL training;Therapeutic exercise;Moist Heat;Electrical Stimulation;Fluidtherapy;Paraffin;Neuromuscular education;Splinting;Patient/family education;Energy conservation;Therapist, nutritional;Therapeutic activities;Cryotherapy;Ultrasound;DME and/or AE instruction;Manual Therapy;Passive range of motion;Balance training    Plan  initiate coordination HEP, hand strength HEP (check if pt/PT who sent note to MD has clarified precautions)    Clinical Decision Making  Several treatment options, min-mod task modification necessary    Consulted and Agree with Plan of Care  Patient       Patient will benefit from skilled therapeutic intervention in order to improve the following deficits and impairments:  Decreased knowledge of use of DME, Pain, Decreased coordination, Decreased mobility, Impaired sensation, Decreased strength, Decreased range of motion, Decreased endurance, Decreased activity tolerance, Decreased balance, Decreased knowledge of precautions, Impaired perceived functional ability, Impaired UE functional use  Visit Diagnosis: Muscle weakness (generalized)  Other lack of coordination  Unsteadiness on feet  Other abnormalities of gait and mobility    Problem List Patient Active Problem List   Diagnosis Date Noted  . 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 S/P spinal surgery 05/24/2017  . Other secondary kyphosis, cervical region 05/24/2017  . Anxiety 11/21/2016  . Stenosis of cervical spine with  myelopathy ([place]) 08/12/2016  . Routine general medical examination at a health care facility 06/01/2016  . Numbness and tingling in both hands 05/18/2015  . Hypothyroidism 03/24/2015  . History of tachycardia 03/24/2015  . HTN (hypertension), benign 06/12/2013  . ESRD on dialysis [facility]) 06/12/2013    [facility] 06/22/2017, 3:22 PM  [facility] [address] [place], [place], [address] Phone: [phone]   Fax:  [phone]  Name: [name] MRN: [id number] Date of Birth: [birth year]   Vianne Bulls, OTR/L [facility] [address] [place], [place]  [address] [phone] phone [phone] 06/22/17 3:22 PM

## 2017-06-23 DIAGNOSIS — E869 Volume depletion, unspecified: Secondary | ICD-10-CM | POA: Diagnosis not present

## 2017-06-23 DIAGNOSIS — M542 Cervicalgia: Secondary | ICD-10-CM | POA: Diagnosis not present

## 2017-06-23 DIAGNOSIS — Z940 Kidney transplant status: Secondary | ICD-10-CM | POA: Diagnosis not present

## 2017-06-23 DIAGNOSIS — D8989 Other specified disorders involving the immune mechanism, not elsewhere classified: Secondary | ICD-10-CM | POA: Diagnosis not present

## 2017-06-23 DIAGNOSIS — E876 Hypokalemia: Secondary | ICD-10-CM | POA: Diagnosis not present

## 2017-06-23 DIAGNOSIS — Z792 Long term (current) use of antibiotics: Secondary | ICD-10-CM | POA: Diagnosis not present

## 2017-06-23 DIAGNOSIS — Z4822 Encounter for aftercare following kidney transplant: Secondary | ICD-10-CM | POA: Diagnosis not present

## 2017-06-23 DIAGNOSIS — M4322 Fusion of spine, cervical region: Secondary | ICD-10-CM | POA: Diagnosis not present

## 2017-06-23 DIAGNOSIS — Z87891 Personal history of nicotine dependence: Secondary | ICD-10-CM | POA: Diagnosis not present

## 2017-06-23 DIAGNOSIS — J449 Chronic obstructive pulmonary disease, unspecified: Secondary | ICD-10-CM | POA: Diagnosis not present

## 2017-06-23 DIAGNOSIS — Z7952 Long term (current) use of systemic steroids: Secondary | ICD-10-CM | POA: Diagnosis not present

## 2017-06-23 DIAGNOSIS — R000 Tachycardia, unspecified: Secondary | ICD-10-CM | POA: Diagnosis not present

## 2017-06-23 DIAGNOSIS — Z79899 Other long term (current) drug therapy: Secondary | ICD-10-CM | POA: Diagnosis not present

## 2017-06-23 DIAGNOSIS — I120 Hypertensive chronic kidney disease with stage 5 chronic kidney disease or end stage renal disease: Secondary | ICD-10-CM | POA: Diagnosis not present

## 2017-06-23 DIAGNOSIS — I10 Essential (primary) hypertension: Secondary | ICD-10-CM | POA: Diagnosis not present

## 2017-06-23 DIAGNOSIS — E872 Acidosis: Secondary | ICD-10-CM | POA: Diagnosis not present

## 2017-06-23 DIAGNOSIS — D649 Anemia, unspecified: Secondary | ICD-10-CM | POA: Diagnosis not present

## 2017-06-26 ENCOUNTER — Encounter: Payer: Self-pay | Admitting: Physical Therapy

## 2017-06-26 ENCOUNTER — Ambulatory Visit: Payer: Medicare Other | Admitting: Physical Therapy

## 2017-06-26 DIAGNOSIS — R2689 Other abnormalities of gait and mobility: Secondary | ICD-10-CM | POA: Diagnosis not present

## 2017-06-26 DIAGNOSIS — M6281 Muscle weakness (generalized): Secondary | ICD-10-CM | POA: Diagnosis not present

## 2017-06-26 DIAGNOSIS — M542 Cervicalgia: Secondary | ICD-10-CM | POA: Diagnosis not present

## 2017-06-26 DIAGNOSIS — R2681 Unsteadiness on feet: Secondary | ICD-10-CM

## 2017-06-26 DIAGNOSIS — R278 Other lack of coordination: Secondary | ICD-10-CM | POA: Diagnosis not present

## 2017-06-26 NOTE — Patient Instructions (Addendum)
Perform these at the counter/table with support as needed for balance:   "I love a Parade" Lift   At counter for balance as needed: high knee marching forward and then backward. 3 second pauses with each knee lift.  Repeat 3 laps each way. Do _1-2_ sessions per day. http://gt2.exer.us/345   Copyright  VHI. All rights reserved.   Walking on Heels   At counter: Walk on heels forward while continuing on a straight path, and then walk on heels backward to starting position. Repeat for 3 laps each way. Do _1-2__ sessions per day.  Copyright  VHI. All rights reserved.  Walking on Toes   At counter for balance as needed: Walk on toes forward while continuing on a straight path, and then backwards on toes to starting position. Repeat 3 laps each way. Do _1-2___ sessions per day.  Copyright  VHI. All rights reserved.  Feet Heel-Toe "Tandem"   At counter: Arms at sides, walk a straight line forward bringing one foot directly in front of the other, and then a straight line backwards bringing one foot directly behind the other one.  Repeat for _3 laps each way. Do _1-2_ sessions per day.  Copyright  VHI. All rights reserved.   Perform these in a corner with a chair in front for safety:   Feet Together (Compliant Surface) Varied Arm Positions - Eyes Closed    Stand on compliant surface: _pillow/s_ with feet together and arms at sides. Close eyes and visualize upright position. Hold_30_ seconds. Repeat _2-3_ times per session. Do _1-2_ sessions per day.  Copyright  VHI. All rights reserved.    Feet Partial Heel-Toe (Compliant Surface) Varied Arm Positions - Eyes Closed    Stand on compliant surface: _pillow/s__ with right foot partially in front of the other and arms at sides. Close eyes and visualize upright position. Hold_30_ seconds.  Switch legs with left foot partially in front of the other and arms at sides. Close eyes and visualize upright posture. Hold for 30 seconds.     Repeat _2-3_ times each session. Do _1-2_ sessions per day.   Copyright  VHI. All rights reserved.  Single Leg - Eyes Open    Holding support, lift right leg while maintaining balance over other leg. Progress to removing hands from support surface for longer periods of time. Hold_15_ seconds. Repeat on the other leg.  Repeat _2-3_ times on each leg per session. Do _1-2_ sessions per day.  Copyright  VHI. All rights reserved.

## 2017-06-27 NOTE — Therapy (Signed)
[facility] [address] [place], [place], [address] Phone: [phone]   Fax:  [phone]  Physical Therapy Treatment  Patient Details  Name: [name] MRN: [id number] Date of Birth: December 12, 1960 Referring Provider: Dr. Ashok Pall   Encounter Date: 06/26/2017  PT End of Session - 06/26/17 1154    Visit Number  2    Number of Visits  17    Date for PT Re-Evaluation  08/21/17    Authorization Type  Medicare    PT Start Time  1150    PT Stop Time  1229    PT Time Calculation (min)  39 min    Activity Tolerance  Patient tolerated treatment well    Behavior During Therapy  [facility] for tasks assessed/performed       Past Medical History:  Diagnosis Date  . Anemia   . Anxiety   . Arthritis   . Claustrophobia   . ESRD (end stage renal disease) on dialysis ([place])    "TTS; [place]" (03/24/2015)  . Glomerulonephritis   . Heart murmur   . HTN (hypertension)   . Hypothyroidism   . Kidney transplant recipient 01/21/2017  . Pancreatitis   . Renal insufficiency   . Secondary hyperparathyroidism ([place])    [name] 05/11/2014    Past Surgical History:  Procedure Laterality Date  . ANTERIOR CERVICAL CORPECTOMY N/A 05/24/2017   Procedure: CORPECTOMY CERVICAL FIVE- CERVICAL SIX;  Surgeon: Ashok Pall, MD;  Location: [facility];  Service: Neurosurgery;  Laterality: N/A;  . ARTERIOVENOUS GRAFT PLACEMENT Right 1990's?  . ARTERIOVENOUS GRAFT PLACEMENT Left 07/2002   upper arm/notes 06/02/2010  . ARTERIOVENOUS GRAFT PLACEMENT Right 07/2003   upper arm/notes 06/02/2010  . AV FISTULA PLACEMENT Left 09/2000   upper arm/notes 06/02/2010  . BREAST BIOPSY Left unsure   benign  . COLONOSCOPY    . DILATATION & CURRETTAGE/HYSTEROSCOPY WITH RESECTOCOPE N/A 04/17/2013   Procedure: DILATATION & CURETTAGE/HYSTEROSCOPY WITH RESECTOCOPE;  Surgeon: Marvene Staff, MD;  Location: [facility];  Service: Gynecology;  Laterality: N/A;  .  DILATION AND CURETTAGE OF UTERUS    . EYE SURGERY    . KIDNEY TRANSPLANT  1997  . KIDNEY TRANSPLANT  01/21/2017   Dr. Harrington Challenger  . PARATHYROIDECTOMY  03/2000 05/12/2014   w/neck exploration & autotransplantation/notes 06/02/2010; w/neck exploration  . PARATHYROIDECTOMY N/A 05/12/2014   Procedure: PARATHYROIDECTOMY AND NECK EXPLORATION;  Surgeon: Armandina Gemma, MD;  Location: [facility];  Service: General;  Laterality: N/A;  . PERITONEAL CATHETER INSERTION    . PERITONEAL CATHETER REMOVAL  08/1999   [name] 06/02/2010  . POSTERIOR CERVICAL FUSION/FORAMINOTOMY N/A 05/30/2017   Procedure: POSTERIOR CERVICAL Arthrodesis Cervical three - four, cervical four - five, cervical five - six, cervical six - seven;  Surgeon: Ashok Pall, MD;  Location: [facility];  Service: Neurosurgery;  Laterality: N/A;  . POSTERIOR CERVICAL LAMINECTOMY N/A 08/12/2016   Procedure: POSTERIOR CERVICAL LAMINECTOMY CERVICAL THREE CERVICAL FOUR, CERVICAL FOUR CERVICAL FIVE, CERVICAL FIVE- CERVICAL SIX, CERVICAL SIX- CERVICAL SEVEN;  Surgeon: Ashok Pall, MD;  Location: [facility];  Service: Neurosurgery;  Laterality: N/A;  POSTERIOR  . RETINAL DETACHMENT SURGERY Left   . THROMBECTOMY Left 02/2002   fistula/notes 06/02/2010  . THROMBECTOMY / ARTERIOVENOUS GRAFT REVISION  11/2003; 01/2004; 08/07/2005; 08/10/2005; 10/2005   [name] 06/02/2010  . THROMBECTOMY AND REVISION OF ARTERIOVENTOUS (AV) GORETEX  GRAFT Right 01/2002; 11/2003; 01/2004; 08/07/2004; 08/10/2004; 11/13/2005   [name] 5/1/2012Marland Kitchen [name] 5/16/2012Marland Kitchen [name] 5/16/2012Marland Kitchen [name] 06/02/2010; [name] 06/02/2010; /  notes 06/02/2010  . THROMBECTOMY AND REVISION OF ARTERIOVENTOUS (AV) GORETEX  GRAFT Left 08/23/2002; 09/13/2002; 07/08/2003   [name] 06/02/2010; [name] 06/02/2010; [name] 06/02/2010    There were no vitals filed for this visit.  Subjective Assessment - 06/26/17 1152    Subjective  No new complaints. Has left a message at neurosugeon's offices (he's out of town). No falls.     Pertinent History  s/p kidney  transplant 01/21/17    Limitations  House hold activities;Walking    How long can you walk comfortably?  Is able to ambulate approx 5-15 mins before she notices R foot dragging.     Patient Stated Goals  "I want to be able to walk without feeling like I"m getting ready to fall."    Currently in Pain?  Yes    Pain Score  5     Pain Location  Neck at surgical site    Pain Orientation  Medial    Pain Descriptors / Indicators  Discomfort    Pain Type  Surgical pain    Pain Onset  1 to 4 weeks ago    Pain Frequency  Constant    Aggravating Factors   increased activity    Pain Relieving Factors  pain medications, lying down      treatment:  Issued the following to pt's HEP today. Min guard assist for balance with cues on posture and correct ex form/technique.  Perform these at the counter/table with support as needed for balance:   "I love a Parade" Lift   At counter for balance as needed: high knee marching forward and then backward. 3 second pauses with each knee lift.  Repeat 3 laps each way. Do _1-2_ sessions per day. http://gt2.exer.us/345   Copyright  VHI. All rights reserved.   Walking on Heels   At counter: Walk on heels forward while continuing on a straight path, and then walk on heels backward to starting position. Repeat for 3 laps each way. Do _1-2__ sessions per day.  Copyright  VHI. All rights reserved.  Walking on Toes   At counter for balance as needed: Walk on toes forward while continuing on a straight path, and then backwards on toes to starting position. Repeat 3 laps each way. Do _1-2___ sessions per day.  Copyright  VHI. All rights reserved.  Feet Heel-Toe "Tandem"   At counter: Arms at sides, walk a straight line forward bringing one foot directly in front of the other, and then a straight line backwards bringing one foot directly behind the other one.  Repeat for _3 laps each way. Do _1-2_ sessions per day.  Copyright  VHI. All rights reserved.    Perform these in a corner with a chair in front for safety:   Feet Together (Compliant Surface) Varied Arm Positions - Eyes Closed    Stand on compliant surface: _pillow/s_ with feet together and arms at sides. Close eyes and visualize upright position. Hold_30_ seconds. Repeat _2-3_ times per session. Do _1-2_ sessions per day.  Copyright  VHI. All rights reserved.    Feet Partial Heel-Toe (Compliant Surface) Varied Arm Positions - Eyes Closed    Stand on compliant surface: _pillow/s__ with right foot partially in front of the other and arms at sides. Close eyes and visualize upright position. Hold_30_ seconds.  Switch legs with left foot partially in front of the other and arms at sides. Close eyes and visualize upright posture. Hold for 30 seconds.   Repeat _2-3_ times each session. Do  _1-2_ sessions per day.   Copyright  VHI. All rights reserved.  Single Leg - Eyes Open    Holding support, lift right leg while maintaining balance over other leg. Progress to removing hands from support surface for longer periods of time. Hold_15_ seconds. Repeat on the other leg.  Repeat _2-3_ times on each leg per session. Do _1-2_ sessions per day.  Copyright  VHI. All rights reserved.       PT Education - 06/26/17 1700    Education Details  issued HEP for balance    Person(s) Educated  Patient    Methods  Explanation;Demonstration;Verbal cues;Handout    Comprehension  Verbalized understanding;Returned demonstration;Verbal cues required;Need further instruction       PT Short Term Goals - 06/22/17 1228      PT SHORT TERM GOAL #1   Title  Pt will be independent with initial HEP in order to indicate improved functional mobility and decreased fall risk.  (Target Date: 07/22/17)    Time  4    Period  Weeks    Status  New    Target Date  07/22/17      PT SHORT TERM GOAL #2   Title  Pt will improve 5TSS to </=16.50 secs without UE support in order to indicate decreased  fall risk and improved functional strength.      Time  4    Period  Weeks    Status  New      PT SHORT TERM GOAL #3   Title  Pt will improve FGA to 19/30 in order to indicate decreased fall risk.      Time  4    Period  Weeks    Status  New      PT SHORT TERM GOAL #4   Title  Pt will ambulate at gait speed of >/=2.62 ft/sec with decreased indication of R foot slap/overt gait deviations indicating safe gait speed.     Time  4    Period  Weeks    Status  New      PT SHORT TERM GOAL #5   Title  Pt will ambulate outdoors over unlevel paved surfaces up to 1000' at mod I level in order to indicate improved community independence.     Time  4    Period  Weeks    Status  New        PT Long Term Goals - 06/22/17 1232      PT LONG TERM GOAL #1   Title  Pt will be independent with final HEP in order to indicate improved functional mobility and decreased fall risk.  (Target Date: 08/21/17)    Time  8    Period  Weeks    Status  New    Target Date  08/21/17      PT LONG TERM GOAL #2   Title  Pt will improve 5TSS to </=13.50 secs without UE support in order to indicate improved functional strength and decreased fall risk.     Time  8    Period  Weeks    Status  New      PT LONG TERM GOAL #3   Title  Pt will improve FGA to >/=23/30 in order to indicate decreased fall risk.      Time  8    Period  Weeks    Status  New      PT LONG TERM GOAL #4   Title  Pt will ambulate >1000'  over varying outdoor surfaces, scanning environment at independent level in order to indicate safe community and leisure mobility.     Time  8    Period  Weeks    Status  New            Plan - 06/26/17 1154    Clinical Impression Statement  Today's skilled session focused on establishment of an HEP to address LE strengthening and balance. No issues reported with performance in session. Pt is progressing toward goals and should benefit from continued PT to progress toward unmet goals.                                       Rehab Potential  Good    Clinical Impairments Affecting Rehab Potential  severity of cord compression     PT Frequency  2x / week    PT Duration  8 weeks    PT Treatment/Interventions  ADLs/Self Care Home Management;Electrical Stimulation;Gait training;Stair training;Functional mobility training;Therapeutic activities;Therapeutic exercise;Balance training;Neuromuscular re-education;Patient/family education;Orthotic Fit/Training;Passive range of motion;Manual techniques    PT Next Visit Plan  See if heard from MD regarding specific restrictions/precautions, all therapy without cervical collar,gentle neck ROM as she is driving, continued to address balance on compliant sufaces, dynmic gait     Consulted and Agree with Plan of Care  Patient       Patient will benefit from skilled therapeutic intervention in order to improve the following deficits and impairments:  Abnormal gait, Decreased activity tolerance, Decreased balance, Decreased endurance, Decreased mobility, Decreased range of motion, Decreased strength, Hypomobility, Impaired perceived functional ability, Impaired flexibility, Postural dysfunction, Improper body mechanics, Impaired sensation  Visit Diagnosis: Muscle weakness (generalized)  Unsteadiness on feet  Other abnormalities of gait and mobility     Problem List Patient Active Problem List   Diagnosis Date Noted  . 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 S/P spinal surgery 05/24/2017  . Other secondary kyphosis, cervical region 05/24/2017  . Anxiety 11/21/2016  . Stenosis of cervical spine with myelopathy ([place]) 08/12/2016  . Routine general medical examination at a health care facility 06/01/2016  . Numbness and tingling in both hands 05/18/2015  . Hypothyroidism 03/24/2015  . History of tachycardia 03/24/2015  . HTN (hypertension), benign 06/12/2013  . ESRD on dialysis [facility]) 06/12/2013    Willow Ora, PTA, [facility] [address], [place],  [address] [phone] 06/27/17, 12:59 PM   Name: [name] MRN: [id number] Date of Birth: [birth year]

## 2017-06-28 ENCOUNTER — Encounter: Payer: Self-pay | Admitting: Physical Therapy

## 2017-06-28 ENCOUNTER — Ambulatory Visit: Payer: Medicare Other | Admitting: Physical Therapy

## 2017-06-28 DIAGNOSIS — R278 Other lack of coordination: Secondary | ICD-10-CM | POA: Diagnosis not present

## 2017-06-28 DIAGNOSIS — R2689 Other abnormalities of gait and mobility: Secondary | ICD-10-CM | POA: Diagnosis not present

## 2017-06-28 DIAGNOSIS — R2681 Unsteadiness on feet: Secondary | ICD-10-CM

## 2017-06-28 DIAGNOSIS — M6281 Muscle weakness (generalized): Secondary | ICD-10-CM | POA: Diagnosis not present

## 2017-06-28 DIAGNOSIS — M542 Cervicalgia: Secondary | ICD-10-CM | POA: Diagnosis not present

## 2017-06-28 NOTE — Telephone Encounter (Signed)
Called to inform patient that PCP would like for her to make an appointment to discuss her anxiety and medication. An appointment was made on 07/06/17.

## 2017-06-28 NOTE — Telephone Encounter (Signed)
Would need visit as this is a controlled substance.

## 2017-06-29 NOTE — Therapy (Signed)
[facility] [address] [place], [place], [address] Phone: [phone]   Fax:  [phone]  Physical Therapy Treatment  Patient Details  Name: [name] MRN: [id number] Date of Birth: March 16, 1960 Referring Provider: Dr. Ashok Pall   Encounter Date: 06/28/2017  PT End of Session - 06/28/17 0850    Visit Number  3    Number of Visits  17    Date for PT Re-Evaluation  08/21/17    Authorization Type  Medicare    PT Start Time  0849    PT Stop Time  0930    PT Time Calculation (min)  41 min    Equipment Utilized During Treatment  Gait belt    Activity Tolerance  Patient tolerated treatment well    Behavior During Therapy  [facility] for tasks assessed/performed       Past Medical History:  Diagnosis Date  . Anemia   . Anxiety   . Arthritis   . Claustrophobia   . ESRD (end stage renal disease) on dialysis ([place])    "TTS; [place]" (03/24/2015)  . Glomerulonephritis   . Heart murmur   . HTN (hypertension)   . Hypothyroidism   . Kidney transplant recipient 01/21/2017  . Pancreatitis   . Renal insufficiency   . Secondary hyperparathyroidism ([place])    [name] 05/11/2014    Past Surgical History:  Procedure Laterality Date  . ANTERIOR CERVICAL CORPECTOMY N/A 05/24/2017   Procedure: CORPECTOMY CERVICAL FIVE- CERVICAL SIX;  Surgeon: Ashok Pall, MD;  Location: [facility];  Service: Neurosurgery;  Laterality: N/A;  . ARTERIOVENOUS GRAFT PLACEMENT Right 1990's?  . ARTERIOVENOUS GRAFT PLACEMENT Left 07/2002   upper arm/notes 06/02/2010  . ARTERIOVENOUS GRAFT PLACEMENT Right 07/2003   upper arm/notes 06/02/2010  . AV FISTULA PLACEMENT Left 09/2000   upper arm/notes 06/02/2010  . BREAST BIOPSY Left unsure   benign  . COLONOSCOPY    . DILATATION & CURRETTAGE/HYSTEROSCOPY WITH RESECTOCOPE N/A 04/17/2013   Procedure: DILATATION & CURETTAGE/HYSTEROSCOPY WITH RESECTOCOPE;  Surgeon: Marvene Staff, MD;  Location: [facility];  Service: Gynecology;  Laterality: N/A;  . DILATION AND CURETTAGE OF UTERUS    . EYE SURGERY    . KIDNEY TRANSPLANT  1997  . KIDNEY TRANSPLANT  01/21/2017   Dr. Harrington Challenger  . PARATHYROIDECTOMY  03/2000 05/12/2014   w/neck exploration & autotransplantation/notes 06/02/2010; w/neck exploration  . PARATHYROIDECTOMY N/A 05/12/2014   Procedure: PARATHYROIDECTOMY AND NECK EXPLORATION;  Surgeon: Armandina Gemma, MD;  Location: [facility];  Service: General;  Laterality: N/A;  . PERITONEAL CATHETER INSERTION    . PERITONEAL CATHETER REMOVAL  08/1999   [name] 06/02/2010  . POSTERIOR CERVICAL FUSION/FORAMINOTOMY N/A 05/30/2017   Procedure: POSTERIOR CERVICAL Arthrodesis Cervical three - four, cervical four - five, cervical five - six, cervical six - seven;  Surgeon: Ashok Pall, MD;  Location: [facility];  Service: Neurosurgery;  Laterality: N/A;  . POSTERIOR CERVICAL LAMINECTOMY N/A 08/12/2016   Procedure: POSTERIOR CERVICAL LAMINECTOMY CERVICAL THREE CERVICAL FOUR, CERVICAL FOUR CERVICAL FIVE, CERVICAL FIVE- CERVICAL SIX, CERVICAL SIX- CERVICAL SEVEN;  Surgeon: Ashok Pall, MD;  Location: [facility];  Service: Neurosurgery;  Laterality: N/A;  POSTERIOR  . RETINAL DETACHMENT SURGERY Left   . THROMBECTOMY Left 02/2002   fistula/notes 06/02/2010  . THROMBECTOMY / ARTERIOVENOUS GRAFT REVISION  11/2003; 01/2004; 08/07/2005; 08/10/2005; 10/2005   [name] 06/02/2010  . THROMBECTOMY AND REVISION OF ARTERIOVENTOUS (AV) GORETEX  GRAFT Right 01/2002; 11/2003; 01/2004; 08/07/2004; 08/10/2004; 11/13/2005   /  notes 05/18/2010; [name] 5/16/2012Marland Kitchen [name] 5/16/2012Marland Kitchen [name] 5/16/2012Marland Kitchen [name] 5/16/2012Marland Kitchen [name] 06/02/2010  . THROMBECTOMY AND REVISION OF ARTERIOVENTOUS (AV) GORETEX  GRAFT Left 08/23/2002; 09/13/2002; 07/08/2003   [name] 06/02/2010; [name] 06/02/2010; [name] 06/02/2010    There were no vitals filed for this visit.  Subjective Assessment - 06/28/17 0849    Subjective  No new complaints. No falls. No pain, just soreness at incision site. Did hear  back from Dr. Christella Noa who stated no precautions/restrictions at all.     Pertinent History  s/p kidney transplant 01/21/17    Limitations  House hold activities;Walking    How long can you walk comfortably?  Is able to ambulate approx 5-15 mins before she notices R foot dragging.     Patient Stated Goals  "I want to be able to walk without feeling like I"m getting ready to fall."    Currently in Pain?  Yes    Pain Score  6     Pain Orientation  Medial    Pain Descriptors / Indicators  Sore;Discomfort    Pain Type  Surgical pain;Acute pain    Pain Onset  1 to 4 weeks ago    Pain Frequency  Constant    Aggravating Factors   increased activity and immobility    Pain Relieving Factors  pain medication, lying down         [facility] Adult PT Treatment/Exercise - 06/28/17 3875      Neuro Re-ed    Neuro Re-ed Details   in corner on 1 inch foam with chair in front for safety:  wide base of support, progressing to narrow base of support for EC head movements left<>right, then up<>down with min guard to min assist for balance, no UE support. cues on posture and weight shifting to assist with balance. cervical motions performed within pain free ranges only.       Exercises   Other Exercises   in hooklying position: cervical retraction for 5 sec holds x 10 reps, scapular retraction with 5 sec holds x 10 reps, cervical nods/shakes x 10 reps each in pain free ranges. cues on ex form needed with all ex's performed.       Manual Therapy   Manual Therapy  Soft tissue mobilization;Myofascial release;Neural Stretch;Manual Traction    Manual therapy comments  all manual therapy performed for decreased pain and tightess of cervical and thoracic area. started with gentel scar massasge progressing to manual therapy listed below. no issues reported afterward. Pt is sensitive to touch, needed light pressure with all manual therapy. trigger point release also performed along bil scapula due to multiple trigger points  palpated with manual therapy.     Soft tissue mobilization  to bil upper traps, scalenes and rhomboids    Myofascial Release  to bil cervical paraspinals, upper traps, rhomboids and scalenes    Manual Traction  suboccipital release for 1 minute holds x 3 reps, progressing to gentle traction of dermis/muscles for stretching of facia/dermis at cervical spine, progressing to gentle cervical distraction for muscular stretching     Neural Stretch  passive scapular depression to bil shoulder for stretching; gentle passive scapular retraction with depression for stretching; gentle passive scapular depression concurrent with gentle cervical distraction x 30 sec holds x 3 each side.           PT Short Term Goals - 06/22/17 1228      PT SHORT TERM GOAL #1   Title  Pt will be independent with initial HEP in  order to indicate improved functional mobility and decreased fall risk.  (Target Date: 07/22/17)    Time  4    Period  Weeks    Status  New    Target Date  07/22/17      PT SHORT TERM GOAL #2   Title  Pt will improve 5TSS to </=16.50 secs without UE support in order to indicate decreased fall risk and improved functional strength.      Time  4    Period  Weeks    Status  New      PT SHORT TERM GOAL #3   Title  Pt will improve FGA to 19/30 in order to indicate decreased fall risk.      Time  4    Period  Weeks    Status  New      PT SHORT TERM GOAL #4   Title  Pt will ambulate at gait speed of >/=2.62 ft/sec with decreased indication of R foot slap/overt gait deviations indicating safe gait speed.     Time  4    Period  Weeks    Status  New      PT SHORT TERM GOAL #5   Title  Pt will ambulate outdoors over unlevel paved surfaces up to 1000' at mod I level in order to indicate improved community independence.     Time  4    Period  Weeks    Status  New        PT Long Term Goals - 06/22/17 1232      PT LONG TERM GOAL #1   Title  Pt will be independent with final HEP in order to  indicate improved functional mobility and decreased fall risk.  (Target Date: 08/21/17)    Time  8    Period  Weeks    Status  New    Target Date  08/21/17      PT LONG TERM GOAL #2   Title  Pt will improve 5TSS to </=13.50 secs without UE support in order to indicate improved functional strength and decreased fall risk.     Time  8    Period  Weeks    Status  New      PT LONG TERM GOAL #3   Title  Pt will improve FGA to >/=23/30 in order to indicate decreased fall risk.      Time  8    Period  Weeks    Status  New      PT LONG TERM GOAL #4   Title  Pt will ambulate >1000' over varying outdoor surfaces, scanning environment at independent level in order to indicate safe community and leisure mobility.     Time  8    Period  Weeks    Status  New            Plan - 06/28/17 0851    Clinical Impression Statement  Today's skilled session began to address cervical/thoracic muscle tightness and incorportate cervical movements into balance activities with no issues reported. Pt is very tight and tender to palpation in her cervical and thorcic area, tolerating light pressure only at this time. Pt will benefit from further treatment of this area to progress her mobility overall and with balance activities.     Rehab Potential  Good    Clinical Impairments Affecting Rehab Potential  severity of cord compression     PT Frequency  2x / week    PT Duration  8 weeks    PT Treatment/Interventions  ADLs/Self Care Home Management;Electrical Stimulation;Gait training;Stair training;Functional mobility training;Therapeutic activities;Therapeutic exercise;Balance training;Neuromuscular re-education;Patient/family education;Orthotic Fit/Training;Passive range of motion;Manual techniques    PT Next Visit Plan   all therapy without cervical collar,gentle neck ROM as she is driving, continued to address balance on compliant sufaces, dynmic gait     Consulted and Agree with Plan of Care  Patient        Patient will benefit from skilled therapeutic intervention in order to improve the following deficits and impairments:  Abnormal gait, Decreased activity tolerance, Decreased balance, Decreased endurance, Decreased mobility, Decreased range of motion, Decreased strength, Hypomobility, Impaired perceived functional ability, Impaired flexibility, Postural dysfunction, Improper body mechanics, Impaired sensation  Visit Diagnosis: Muscle weakness (generalized)  Unsteadiness on feet  Other abnormalities of gait and mobility     Problem List Patient Active Problem List   Diagnosis Date Noted  . 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 S/P spinal surgery 05/24/2017  . Other secondary kyphosis, cervical region 05/24/2017  . Anxiety 11/21/2016  . Stenosis of cervical spine with myelopathy ([place]) 08/12/2016  . Routine general medical examination at a health care facility 06/01/2016  . Numbness and tingling in both hands 05/18/2015  . Hypothyroidism 03/24/2015  . History of tachycardia 03/24/2015  . HTN (hypertension), benign 06/12/2013  . ESRD on dialysis [facility]) 06/12/2013    Willow Ora, PTA, [facility] [address] [place], [place] [address] [phone] 06/29/17, 9:24 AM   Name: TARITA DESHMUKH MRN: [id number] Date of Birth: [birth year]

## 2017-07-03 ENCOUNTER — Ambulatory Visit: Payer: Medicare Other | Admitting: Occupational Therapy

## 2017-07-03 ENCOUNTER — Encounter: Payer: Self-pay | Admitting: Rehabilitation

## 2017-07-03 ENCOUNTER — Ambulatory Visit: Payer: Medicare Other | Admitting: Rehabilitation

## 2017-07-03 DIAGNOSIS — R2681 Unsteadiness on feet: Secondary | ICD-10-CM

## 2017-07-03 DIAGNOSIS — R2689 Other abnormalities of gait and mobility: Secondary | ICD-10-CM

## 2017-07-03 DIAGNOSIS — M6281 Muscle weakness (generalized): Secondary | ICD-10-CM

## 2017-07-03 DIAGNOSIS — M542 Cervicalgia: Secondary | ICD-10-CM | POA: Diagnosis not present

## 2017-07-03 DIAGNOSIS — R278 Other lack of coordination: Secondary | ICD-10-CM

## 2017-07-03 NOTE — Therapy (Signed)
[facility] [address] [place], [place], [address] Phone: [phone]   Fax:  [phone]  Occupational Therapy Treatment  Patient Details  Name: [name] MRN: [id number] Date of Birth: 05-Aug-1960 Referring Provider: Dr. Ashok Pall   Encounter Date: 07/03/2017  OT End of Session - 07/03/17 1140    Visit Number  2    Number of Visits  17    Date for OT Re-Evaluation  09/20/17    Authorization Type  Medicare $3000 visit limt    Authorization Time Period  90 day cert. period  (through 09/20/17)    OT Start Time  1109    OT Stop Time  1149    OT Time Calculation (min)  40 min    Activity Tolerance  Patient tolerated treatment well    Behavior During Therapy  WFL for tasks assessed/performed       Past Medical History:  Diagnosis Date  . Anemia   . Anxiety   . Arthritis   . Claustrophobia   . ESRD (end stage renal disease) on dialysis ([facility])    "TTS; [facility]" (03/24/2015)  . Glomerulonephritis   . Heart murmur   . HTN (hypertension)   . Hypothyroidism   . Kidney transplant recipient 01/21/2017  . Pancreatitis   . Renal insufficiency   . Secondary hyperparathyroidism ([facility])    [name] 05/11/2014    Past Surgical History:  Procedure Laterality Date  . ANTERIOR CERVICAL CORPECTOMY N/A 05/24/2017   Procedure: CORPECTOMY CERVICAL FIVE- CERVICAL SIX;  Surgeon: Ashok Pall, MD;  Location: [facility];  Service: Neurosurgery;  Laterality: N/A;  . ARTERIOVENOUS GRAFT PLACEMENT Right 1990's?  . ARTERIOVENOUS GRAFT PLACEMENT Left 07/2002   upper arm/notes 06/02/2010  . ARTERIOVENOUS GRAFT PLACEMENT Right 07/2003   upper arm/notes 06/02/2010  . AV FISTULA PLACEMENT Left 09/2000   upper arm/notes 06/02/2010  . BREAST BIOPSY Left unsure   benign  . COLONOSCOPY    . DILATATION & CURRETTAGE/HYSTEROSCOPY WITH RESECTOCOPE N/A 04/17/2013   Procedure: DILATATION & CURETTAGE/HYSTEROSCOPY WITH RESECTOCOPE;  Surgeon:  Marvene Staff, MD;  Location: [facility];  Service: Gynecology;  Laterality: N/A;  . DILATION AND CURETTAGE OF UTERUS    . EYE SURGERY    . KIDNEY TRANSPLANT  1997  . KIDNEY TRANSPLANT  01/21/2017   Dr. Harrington Challenger  . PARATHYROIDECTOMY  03/2000 05/12/2014   w/neck exploration & autotransplantation/notes 06/02/2010; w/neck exploration  . PARATHYROIDECTOMY N/A 05/12/2014   Procedure: PARATHYROIDECTOMY AND NECK EXPLORATION;  Surgeon: Armandina Gemma, MD;  Location: [facility];  Service: General;  Laterality: N/A;  . PERITONEAL CATHETER INSERTION    . PERITONEAL CATHETER REMOVAL  08/1999   [name] 06/02/2010  . POSTERIOR CERVICAL FUSION/FORAMINOTOMY N/A 05/30/2017   Procedure: POSTERIOR CERVICAL Arthrodesis Cervical three - four, cervical four - five, cervical five - six, cervical six - seven;  Surgeon: Ashok Pall, MD;  Location: [facility];  Service: Neurosurgery;  Laterality: N/A;  . POSTERIOR CERVICAL LAMINECTOMY N/A 08/12/2016   Procedure: POSTERIOR CERVICAL LAMINECTOMY CERVICAL THREE CERVICAL FOUR, CERVICAL FOUR CERVICAL FIVE, CERVICAL FIVE- CERVICAL SIX, CERVICAL SIX- CERVICAL SEVEN;  Surgeon: Ashok Pall, MD;  Location: [facility];  Service: Neurosurgery;  Laterality: N/A;  POSTERIOR  . RETINAL DETACHMENT SURGERY Left   . THROMBECTOMY Left 02/2002   fistula/notes 06/02/2010  . THROMBECTOMY / ARTERIOVENOUS GRAFT REVISION  11/2003; 01/2004; 08/07/2005; 08/10/2005; 10/2005   [name] 06/02/2010  . THROMBECTOMY AND REVISION OF ARTERIOVENTOUS (AV) GORETEX  GRAFT Right 01/2002;  11/2003; 01/2004; 08/07/2004; 08/10/2004; 11/13/2005   [name] 5/1/2012Marland Kitchen [name] 5/16/2012Marland Kitchen [name] 5/16/2012Marland Kitchen [name] 5/16/2012Marland Kitchen [name] 06/02/2010; [name] 06/02/2010  . THROMBECTOMY AND REVISION OF ARTERIOVENTOUS (AV) GORETEX  GRAFT Left 08/23/2002; 09/13/2002; 07/08/2003   [name] 06/02/2010; [name] 06/02/2010; [name] 06/02/2010    There were no vitals filed for this visit.  Subjective Assessment - 07/03/17 1111    Subjective   Pt reports that she called MD office  regarding restrictions and reports that she may begin low-range strengthening, ok to perform overhead reaching, no lifting lb limit.  Pt reports unable to use manual can opener, unable to do pull top can, unable to cut food with knife    Pertinent History  s/p C5/6/7 anterior corpectomy (with strut graft and anterior plating) and posterior C3-T1 arthrodesis on 05/24/17.  Pt with PMH that includes:  Arthritis, ESRD, s/p kidney transplant 01/21/17, HTN, Hypotyroidism, heart murmur, anxiety, history of C3-C6 posterior decompression 2018    Limitations  Pt reports that she called MD office regarding restrictions and reports that she may begin low-range strengthening, ok to perform overhead reaching, no lifting lb limit, cervical brace for comfort    Patient Stated Goals  improve coordination/strength UEs, return to work    Currently in Pain?  Yes    Pain Score  4     Pain Location  Neck    Pain Orientation  Mid    Pain Descriptors / Indicators  Aching    Pain Type  Surgical pain    Pain Onset  More than a month ago    Pain Frequency  Constant    Aggravating Factors   too much activity    Pain Relieving Factors  tylenol         [facility] OT Assessment - 07/03/17 0001      Precautions   Precaution Comments  per pt:  no lifting restrictions, ok for overhead reach and to begin low range strengthening 07/03/17    Cervical Brace  Hard collar;For comfort                       OT Education - 07/03/17 1131    Education Details  Red putty HEP, Coordination HEP-see pt instructions    Person(s) Educated  Patient    Methods  Explanation;Demonstration;Verbal cues;Handout    Comprehension  Verbalized understanding;Returned demonstration       OT Short Term Goals - 06/22/17 1351      OT SHORT TERM GOAL #1   Title  Pt will be independent with initial HEP.--check STGs 07/22/17    Time  4    Period  Weeks    Status  New      OT SHORT TERM GOAL #2   Title  Pt will improve coordination for  ADLs as shown by completing 9-hole peg test in less than 22sec with R hand and 24sec with L hand.     Baseline  R-24.44, L-29.47sec    Time  4    Period  Weeks    Status  New      OT SHORT TERM GOAL #3   Title  Pt will be able to perform overhead reaching to retrieve light object without compensation (if cleared by physician).    Baseline  75% with compensation    Time  4    Period  Weeks    Status  New      OT SHORT TERM GOAL #4   Title  Pt will improve bilateral grip  strength by at least 5lbs to assist with ADLs (opening containers, gripping).    Baseline  R-35lbs, L-32lbs    Time  4    Period  Weeks    Status  New      OT SHORT TERM GOAL #5   Title  Pt will improve lateral pinch strength by at least 4lbs bilaterally for work tasks/ADLs.    Baseline  R-7lbs, L-6lbs    Time  4    Period  Weeks    Status  New        OT Long Term Goals - 06/22/17 1507      OT LONG TERM GOAL #1   Title  Pt will be independent with updated HEP.--check LTGs 08/22/17    Time  12    Period  Weeks    Status  New      OT LONG TERM GOAL #2   Title  Pt will be able to retrieve at least 2lb object from overhead shelf with each UE safely.    Time  12    Period  Weeks    Status  New      OT LONG TERM GOAL #3   Title  Pt will perform simulated fine motor work tasks (typing, writing, preparing slides, etc) with min difficulty/sufficient for return to work.    Time  12    Period  Weeks    Status  New      OT LONG TERM GOAL #4   Title  Pt will improve 3point pinch strength by at least 4lbs bilaterally for work tasks/ADLs.    Baseline  R-7bs, L-6lbs    Time  12    Period  Weeks    Status  New      OT LONG TERM GOAL #5   Title  Pt will improve bilateral grip strength by at least 10lbs to assist with ADLs (opening containers, gripping).    Baseline  R-35lbs, L-32lbs    Time  12    Period  Weeks    Status  New      OT LONG TERM GOAL #6   Title  Pt will be able to perform mod complex IADLs  (vacuuming, carrying heavier groceries, etc).  mod I.    Time  12    Period  Weeks    Status  New      OT LONG TERM GOAL #7   Title  --    Baseline  --    Time  --    Period  --    Status  --            Plan - 07/03/17 1143    Clinical Impression Statement  Pt is progressing well with understanding of initial HEP.  Pt reports no lifting/overhead reaching restrictions.      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      Occupational performance deficits (Please refer to evaluation for details):  ADL's;IADL's;Work;Leisure    Rehab Potential  Good    OT Frequency  2x / week    OT Duration  12 weeks +eval    OT Treatment/Interventions  Self-care/ADL training;Therapeutic exercise;Moist Heat;Electrical Stimulation;Fluidtherapy;Paraffin;Neuromuscular education;Splinting;Patient/family education;Energy conservation;Therapist, nutritional;Therapeutic activities;Cryotherapy;Ultrasound;DME and/or AE instruction;Manual Therapy;Passive range of motion;Balance training    Plan  ?cane/ball HEP  for ROM, ? low range strengthening HEP, coordination    Clinical Decision Making  Several treatment options, min-mod task modification necessary    OT Home Exercise Plan  Education provided:  Red putty HEP, Coordination HEP    Consulted and Agree with Plan of Care  Patient       Patient will benefit from skilled therapeutic intervention in order to improve the following deficits and impairments:  Decreased knowledge of use of DME, Pain, Decreased coordination, Decreased mobility, Impaired sensation, Decreased strength, Decreased range of motion, Decreased endurance, Decreased activity tolerance, Decreased balance, Decreased knowledge of precautions, Impaired perceived functional ability,  Impaired UE functional use  Visit Diagnosis: Muscle weakness (generalized)  Unsteadiness on feet  Other abnormalities of gait and mobility  Other lack of coordination    Problem List Patient Active Problem List   Diagnosis Date Noted  . 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 S/P spinal surgery 05/24/2017  . Other secondary kyphosis, cervical region 05/24/2017  . Anxiety 11/21/2016  . Stenosis of cervical spine with myelopathy ([facility]) 08/12/2016  . Routine general medical examination at a health care facility 06/01/2016  . Numbness and tingling in both hands 05/18/2015  . Hypothyroidism 03/24/2015  . History of tachycardia 03/24/2015  . HTN (hypertension), benign 06/12/2013  . ESRD on dialysis [facility]) 06/12/2013    [facility] 07/03/2017, 1:14 PM  [facility] [address] [place], [place], [address] Phone: [phone]   Fax:  [phone]  Name: [name] MRN: [id number] Date of Birth: Dec 05, 1960   Vianne Bulls, OTR/L [facility] [address] [place], [place]  [address] [phone] phone [phone] 07/03/17 1:14 PM

## 2017-07-03 NOTE — Patient Instructions (Addendum)
Upper Cervical Rotation    Rotate head slowly from side to side as if saying "no". Do not turn head completely to either side. Keep motion small. Hold each side for 3-5 seconds.   Repeat ___10_ times per set. Do __1-2__ sets per session. Do _2___ sessions per day.  http://orth.exer.us/375   Copyright  VHI. All rights reserved.   Nod: Cervical Flexion    Nod head, tipping chin down. Tighten muscles in the back of throat.  Hold each position x 3-5 seconds.  Do _10__ times, _2__ times per day.  http://ss.exer.us/191   Copyright  VHI. All rights reserved.   AROM: Lateral Neck Flexion    Slowly tilt head toward one shoulder, then the other. Hold each position _10-15___ seconds. (work your way up to 30 as able) Repeat _5___ times per set. Do _1___ sets per session. Do __1-2__ sessions per day.  http://orth.exer.us/297   Copyright  VHI. All rights reserved.    Axial Extension (Chin Tuck)    Do this lying down!!  Pull chin in and lengthen back of neck. Hold _3-5___ seconds while counting out loud. Repeat _10___ times. Do _1-2___ sessions per day.  http://gt2.exer.us/450   Copyright  VHI. All rights reserved.

## 2017-07-03 NOTE — Patient Instructions (Addendum)
1. Grip Strengthening (Resistive Putty)   Squeeze putty using thumb and all fingers. Repeat 20 times. Do 2 sessions per day.   Extension (Assistive Putty)   Roll putty back and forth, being sure to use all fingertips. Repeat 3 times. Do 2 sessions per day.  Then pinch as below.   Palmar Pinch Strengthening (Resistive Putty)   Pinch putty between thumb and each fingertip in turn after rolling out.  Can do ring/little fingers together   MP Flexion (Resistive Putty)Lateral Pinch Strengthening (Resistive Putty)    Squeeze between thumb and side of each finger in turn. Repeat 10 times. Do 2 sessions per day.  Copyright  VHI. All rights reserved.     Bending only at large knuckles, press putty down against thumb. Keep fingertips straight. Repeat  5-10 times. Do 2 sessions per day.      Coordination Activities  Perform the following activities for 15 minutes 2 times per day with both hand(s).   Rotate ball in fingertips (clockwise and counter-clockwise).  Flip cards 1 at a time   Deal cards with your thumb (Hold deck in hand and push card off top with thumb).  Pick up coins and place in container or coin bank.  Pick up coins and stack.

## 2017-07-03 NOTE — Therapy (Signed)
[facility] [address] [place], [place], [address] Phone: [phone]   Fax:  [phone]  Physical Therapy Treatment  Patient Details  Name: [name] MRN: [id number] Date of Birth: [birth year] Referring Provider: Dr. Ashok Pall   Encounter Date: 07/03/2017  PT End of Session - 07/03/17 1554    Visit Number  4    Number of Visits  17    Date for PT Re-Evaluation  08/21/17    Authorization Type  Medicare    PT Start Time  1153    PT Stop Time  1235    PT Time Calculation (min)  42 min    Equipment Utilized During Treatment  Gait belt    Activity Tolerance  Patient tolerated treatment well    Behavior During Therapy  [facility] for tasks assessed/performed       Past Medical History:  Diagnosis Date  . Anemia   . Anxiety   . Arthritis   . Claustrophobia   . ESRD (end stage renal disease) on dialysis ([place])    "TTS; [place]" (03/24/2015)  . Glomerulonephritis   . Heart murmur   . HTN (hypertension)   . Hypothyroidism   . Kidney transplant recipient 01/21/2017  . Pancreatitis   . Renal insufficiency   . Secondary hyperparathyroidism ([place])    [name] 05/11/2014    Past Surgical History:  Procedure Laterality Date  . ANTERIOR CERVICAL CORPECTOMY N/A 05/24/2017   Procedure: CORPECTOMY CERVICAL FIVE- CERVICAL SIX;  Surgeon: Ashok Pall, MD;  Location: [facility];  Service: Neurosurgery;  Laterality: N/A;  . ARTERIOVENOUS GRAFT PLACEMENT Right 1990's?  . ARTERIOVENOUS GRAFT PLACEMENT Left 07/2002   upper arm/notes 06/02/2010  . ARTERIOVENOUS GRAFT PLACEMENT Right 07/2003   upper arm/notes 06/02/2010  . AV FISTULA PLACEMENT Left 09/2000   upper arm/notes 06/02/2010  . BREAST BIOPSY Left unsure   benign  . COLONOSCOPY    . DILATATION & CURRETTAGE/HYSTEROSCOPY WITH RESECTOCOPE N/A 04/17/2013   Procedure: DILATATION & CURETTAGE/HYSTEROSCOPY WITH RESECTOCOPE;  Surgeon: Marvene Staff, MD;  Location: [facility];  Service: Gynecology;  Laterality: N/A;  . DILATION AND CURETTAGE OF UTERUS    . EYE SURGERY    . KIDNEY TRANSPLANT  1997  . KIDNEY TRANSPLANT  01/21/2017   Dr. Harrington Challenger  . PARATHYROIDECTOMY  03/2000 05/12/2014   w/neck exploration & autotransplantation/notes 06/02/2010; w/neck exploration  . PARATHYROIDECTOMY N/A 05/12/2014   Procedure: PARATHYROIDECTOMY AND NECK EXPLORATION;  Surgeon: Armandina Gemma, MD;  Location: [facility];  Service: General;  Laterality: N/A;  . PERITONEAL CATHETER INSERTION    . PERITONEAL CATHETER REMOVAL  08/1999   [name] 06/02/2010  . POSTERIOR CERVICAL FUSION/FORAMINOTOMY N/A 05/30/2017   Procedure: POSTERIOR CERVICAL Arthrodesis Cervical three - four, cervical four - five, cervical five - six, cervical six - seven;  Surgeon: Ashok Pall, MD;  Location: [facility];  Service: Neurosurgery;  Laterality: N/A;  . POSTERIOR CERVICAL LAMINECTOMY N/A 08/12/2016   Procedure: POSTERIOR CERVICAL LAMINECTOMY CERVICAL THREE CERVICAL FOUR, CERVICAL FOUR CERVICAL FIVE, CERVICAL FIVE- CERVICAL SIX, CERVICAL SIX- CERVICAL SEVEN;  Surgeon: Ashok Pall, MD;  Location: [facility];  Service: Neurosurgery;  Laterality: N/A;  POSTERIOR  . RETINAL DETACHMENT SURGERY Left   . THROMBECTOMY Left 02/2002   fistula/notes 06/02/2010  . THROMBECTOMY / ARTERIOVENOUS GRAFT REVISION  11/2003; 01/2004; 08/07/2005; 08/10/2005; 10/2005   [name] 06/02/2010  . THROMBECTOMY AND REVISION OF ARTERIOVENTOUS (AV) GORETEX  GRAFT Right 01/2002; 11/2003; 01/2004; 08/07/2004; 08/10/2004; 11/13/2005   /  notes 05/18/2010; [name] 5/16/2012Marland Kitchen [name] 5/16/2012Marland Kitchen [name] 5/16/2012Marland Kitchen [name] 5/16/2012Marland Kitchen [name] 06/02/2010  . THROMBECTOMY AND REVISION OF ARTERIOVENTOUS (AV) GORETEX  GRAFT Left 08/23/2002; 09/13/2002; 07/08/2003   [name] 06/02/2010; [name] 06/02/2010; [name] 06/02/2010    There were no vitals filed for this visit.  Subjective Assessment - 07/03/17 1158    Subjective  Pt reports slight decrease in pain today.      Pertinent History  s/p kidney  transplant 01/21/17    Limitations  House hold activities;Walking    How long can you walk comfortably?  Is able to ambulate approx 5-15 mins before she notices R foot dragging.     Patient Stated Goals  "I want to be able to walk without feeling like I"m getting ready to fall."    Currently in Pain?  Yes    Pain Score  4     Pain Location  Neck    Pain Orientation  Mid    Pain Descriptors / Indicators  Aching    Pain Type  Surgical pain    Pain Onset  More than a month ago    Pain Frequency  Constant    Aggravating Factors   too much activity    Pain Relieving Factors  tylenol                       [facility] Adult PT Treatment/Exercise - 07/03/17 1155      Neuro Re-ed    Neuro Re-ed Details   Pt demo'd counter top marching exercise to PT.  Pt with questions regarding reps and height of counter.  Pt then reports that her counter is shorter.  PT educated to add another rep but her technique was good.       Exercises   Exercises  Neck      Neck Exercises: Seated   Cervical Rotation  10 reps;Both;Other (comment) with 2-3 sec hold    Lateral Flexion  Both;Other (comment);5 reps with 10 sec holds    Other Seated Exercise  Cervical flexion/extension x 10 reps with 2-3 sec holds.  Added to HEP.       Neck Exercises: Supine   Neck Retraction  10 reps;5 secs    Neck Retraction Limitations  min cues for technique.       Manual Therapy   Manual Therapy  Soft tissue mobilization;Myofascial release;Neural Stretch    Manual therapy comments  Manual therapy performed to decrease pain and improve flexibility and cervical and thoracic spine.     Soft tissue mobilization  Continue to provide soft tissue mobilization to B upper traps (L with increased tightness than R), B scalenes, and B levators    Myofascial Release  to bil cervical paraspinals, upper traps, rhomboids and scalenes.  Subocciptal release x 2 reps of 1 min each.     Neural Stretch  L median nerve glide in supine with PT  providing stretch and cues/support for scapular stabilization and progressed to performing in standing with L hand on wall at approx 45 deg abd turning body slightly to the R with 20 sec hold and again at approx 60 deg shoulder abd.           Upper Cervical Rotation    Rotate head slowly from side to side as if saying "no". Do not turn head completely to either side. Keep motion small. Hold each side for 3-5 seconds.   Repeat ___10_ times per set. Do __1-2__ sets per session. Do _2___ sessions per  day.  http://orth.exer.us/375   Copyright  VHI. All rights reserved.   Nod: Cervical Flexion    Nod head, tipping chin down. Tighten muscles in the back of throat.  Hold each position x 3-5 seconds.  Do _10__ times, _2__ times per day.  http://ss.exer.us/191   Copyright  VHI. All rights reserved.   AROM: Lateral Neck Flexion    Slowly tilt head toward one shoulder, then the other. Hold each position _10-15___ seconds. (work your way up to 30 as able) Repeat _5___ times per set. Do _1___ sets per session. Do __1-2__ sessions per day.  http://orth.exer.us/297   Copyright  VHI. All rights reserved.    Axial Extension (Chin Tuck)    Do this lying down!!  Pull chin in and lengthen back of neck. Hold _3-5___ seconds while counting out loud. Repeat _10___ times. Do _1-2___ sessions per day.  http://gt2.exer.us/450   Copyright  VHI. All rights reserved.      PT Education - 07/03/17 1554    Education Details  Education on how decreased head/neck motion impacts balance, additions to HEP    Person(s) Educated  Patient    Methods  Explanation;Demonstration    Comprehension  Verbalized understanding;Returned demonstration       PT Short Term Goals - 06/22/17 1228      PT SHORT TERM GOAL #1   Title  Pt will be independent with initial HEP in order to indicate improved functional mobility and decreased fall risk.  (Target Date: 07/22/17)    Time  4    Period  Weeks     Status  New    Target Date  07/22/17      PT SHORT TERM GOAL #2   Title  Pt will improve 5TSS to </=16.50 secs without UE support in order to indicate decreased fall risk and improved functional strength.      Time  4    Period  Weeks    Status  New      PT SHORT TERM GOAL #3   Title  Pt will improve FGA to 19/30 in order to indicate decreased fall risk.      Time  4    Period  Weeks    Status  New      PT SHORT TERM GOAL #4   Title  Pt will ambulate at gait speed of >/=2.62 ft/sec with decreased indication of R foot slap/overt gait deviations indicating safe gait speed.     Time  4    Period  Weeks    Status  New      PT SHORT TERM GOAL #5   Title  Pt will ambulate outdoors over unlevel paved surfaces up to 1000' at mod I level in order to indicate improved community independence.     Time  4    Period  Weeks    Status  New        PT Long Term Goals - 06/22/17 1232      PT LONG TERM GOAL #1   Title  Pt will be independent with final HEP in order to indicate improved functional mobility and decreased fall risk.  (Target Date: 08/21/17)    Time  8    Period  Weeks    Status  New    Target Date  08/21/17      PT LONG TERM GOAL #2   Title  Pt will improve 5TSS to </=13.50 secs without UE support in order to indicate improved functional  strength and decreased fall risk.     Time  8    Period  Weeks    Status  New      PT LONG TERM GOAL #3   Title  Pt will improve FGA to >/=23/30 in order to indicate decreased fall risk.      Time  8    Period  Weeks    Status  New      PT LONG TERM GOAL #4   Title  Pt will ambulate >1000' over varying outdoor surfaces, scanning environment at independent level in order to indicate safe community and leisure mobility.     Time  8    Period  Weeks    Status  New            Plan - 07/03/17 1555    Clinical Impression Statement  Skilled session focused on manual therapy/soft tissue work to B cervical and thoracic muscles to  decreased tightness, decreased pain and improve ROM.  Also incorporated L UE neural stretch during session.  Pt tolerated all very well.     Rehab Potential  Good    Clinical Impairments Affecting Rehab Potential  severity of cord compression     PT Frequency  2x / week    PT Duration  8 weeks    PT Treatment/Interventions  ADLs/Self Care Home Management;Electrical Stimulation;Gait training;Stair training;Functional mobility training;Therapeutic activities;Therapeutic exercise;Balance training;Neuromuscular re-education;Patient/family education;Orthotic Fit/Training;Passive range of motion;Manual techniques    PT Next Visit Plan  Give HEP handout from last session-Shuan Statzer forgot, take ROM measurements and make goals as able.  all therapy without cervical collar,gentle neck ROM as she is driving, continued to address balance on compliant sufaces, dynmic gait     Consulted and Agree with Plan of Care  Patient       Patient will benefit from skilled therapeutic intervention in order to improve the following deficits and impairments:  Abnormal gait, Decreased activity tolerance, Decreased balance, Decreased endurance, Decreased mobility, Decreased range of motion, Decreased strength, Hypomobility, Impaired perceived functional ability, Impaired flexibility, Postural dysfunction, Improper body mechanics, Impaired sensation  Visit Diagnosis: Muscle weakness (generalized)  Unsteadiness on feet  Other abnormalities of gait and mobility  Cervicalgia     Problem List Patient Active Problem List   Diagnosis Date Noted  . 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 S/P spinal surgery 05/24/2017  . Other secondary kyphosis, cervical region 05/24/2017  . Anxiety 11/21/2016  . Stenosis of cervical spine with myelopathy ([place]) 08/12/2016  . Routine general medical examination at a health care facility 06/01/2016  . Numbness and tingling in both hands 05/18/2015  . Hypothyroidism 03/24/2015  . History of  tachycardia 03/24/2015  . HTN (hypertension), benign 06/12/2013  . ESRD on dialysis [facility]) 06/12/2013    Denice Bors 07/03/2017, 3:58 PM  [facility] [address] [place], [place], [address] Phone: [phone]   Fax:  [phone]  Name: [name] MRN: [id number] Date of Birth: September 20, 1960

## 2017-07-04 ENCOUNTER — Encounter: Payer: Self-pay | Admitting: Occupational Therapy

## 2017-07-04 ENCOUNTER — Ambulatory Visit: Payer: Medicare Other | Admitting: Occupational Therapy

## 2017-07-04 DIAGNOSIS — M542 Cervicalgia: Secondary | ICD-10-CM | POA: Diagnosis not present

## 2017-07-04 DIAGNOSIS — R2681 Unsteadiness on feet: Secondary | ICD-10-CM | POA: Diagnosis not present

## 2017-07-04 DIAGNOSIS — R2689 Other abnormalities of gait and mobility: Secondary | ICD-10-CM | POA: Diagnosis not present

## 2017-07-04 DIAGNOSIS — R278 Other lack of coordination: Secondary | ICD-10-CM

## 2017-07-04 DIAGNOSIS — M6281 Muscle weakness (generalized): Secondary | ICD-10-CM | POA: Diagnosis not present

## 2017-07-04 NOTE — Therapy (Signed)
[facility] [address] [place], [place], [address] Phone: [phone]   Fax:  [phone]  Occupational Therapy Treatment  Patient Details  Name: [name] MRN: [id number] Date of Birth: 1960-09-17 Referring Provider: Dr. Ashok Pall   Encounter Date: 07/04/2017  OT End of Session - 07/04/17 1040    Visit Number  3    Number of Visits  17    Date for OT Re-Evaluation  09/20/17    Authorization Type  Medicare $3000 visit limt    Authorization Time Period  90 day cert. period  (through 09/20/17)    OT Start Time  1036    OT Stop Time  1120    OT Time Calculation (min)  44 min    Activity Tolerance  Patient tolerated treatment well    Behavior During Therapy  WFL for tasks assessed/performed       Past Medical History:  Diagnosis Date  . Anemia   . Anxiety   . Arthritis   . Claustrophobia   . ESRD (end stage renal disease) on dialysis ([place])    "TTS; [place]" (03/24/2015)  . Glomerulonephritis   . Heart murmur   . HTN (hypertension)   . Hypothyroidism   . Kidney transplant recipient 01/21/2017  . Pancreatitis   . Renal insufficiency   . Secondary hyperparathyroidism ([place])    [name] 05/11/2014    Past Surgical History:  Procedure Laterality Date  . ANTERIOR CERVICAL CORPECTOMY N/A 05/24/2017   Procedure: CORPECTOMY CERVICAL FIVE- CERVICAL SIX;  Surgeon: Ashok Pall, MD;  Location: [facility];  Service: Neurosurgery;  Laterality: N/A;  . ARTERIOVENOUS GRAFT PLACEMENT Right 1990's?  . ARTERIOVENOUS GRAFT PLACEMENT Left 07/2002   upper arm/notes 06/02/2010  . ARTERIOVENOUS GRAFT PLACEMENT Right 07/2003   upper arm/notes 06/02/2010  . AV FISTULA PLACEMENT Left 09/2000   upper arm/notes 06/02/2010  . BREAST BIOPSY Left unsure   benign  . COLONOSCOPY    . DILATATION & CURRETTAGE/HYSTEROSCOPY WITH RESECTOCOPE N/A 04/17/2013   Procedure: DILATATION & CURETTAGE/HYSTEROSCOPY WITH RESECTOCOPE;  Surgeon:  Marvene Staff, MD;  Location: [facility];  Service: Gynecology;  Laterality: N/A;  . DILATION AND CURETTAGE OF UTERUS    . EYE SURGERY    . KIDNEY TRANSPLANT  1997  . KIDNEY TRANSPLANT  01/21/2017   Dr. Harrington Challenger  . PARATHYROIDECTOMY  03/2000 05/12/2014   w/neck exploration & autotransplantation/notes 06/02/2010; w/neck exploration  . PARATHYROIDECTOMY N/A 05/12/2014   Procedure: PARATHYROIDECTOMY AND NECK EXPLORATION;  Surgeon: Armandina Gemma, MD;  Location: [facility];  Service: General;  Laterality: N/A;  . PERITONEAL CATHETER INSERTION    . PERITONEAL CATHETER REMOVAL  08/1999   [name] 06/02/2010  . POSTERIOR CERVICAL FUSION/FORAMINOTOMY N/A 05/30/2017   Procedure: POSTERIOR CERVICAL Arthrodesis Cervical three - four, cervical four - five, cervical five - six, cervical six - seven;  Surgeon: Ashok Pall, MD;  Location: [facility];  Service: Neurosurgery;  Laterality: N/A;  . POSTERIOR CERVICAL LAMINECTOMY N/A 08/12/2016   Procedure: POSTERIOR CERVICAL LAMINECTOMY CERVICAL THREE CERVICAL FOUR, CERVICAL FOUR CERVICAL FIVE, CERVICAL FIVE- CERVICAL SIX, CERVICAL SIX- CERVICAL SEVEN;  Surgeon: Ashok Pall, MD;  Location: [facility];  Service: Neurosurgery;  Laterality: N/A;  POSTERIOR  . RETINAL DETACHMENT SURGERY Left   . THROMBECTOMY Left 02/2002   fistula/notes 06/02/2010  . THROMBECTOMY / ARTERIOVENOUS GRAFT REVISION  11/2003; 01/2004; 08/07/2005; 08/10/2005; 10/2005   [name] 06/02/2010  . THROMBECTOMY AND REVISION OF ARTERIOVENTOUS (AV) GORETEX  GRAFT Right 01/2002;  11/2003; 01/2004; 08/07/2004; 08/10/2004; 11/13/2005   [name] 5/1/2012Marland Kitchen [name] 5/16/2012Marland Kitchen [name] 5/16/2012Marland Kitchen [name] 5/16/2012Marland Kitchen [name] 06/02/2010; [name] 06/02/2010  . THROMBECTOMY AND REVISION OF ARTERIOVENTOUS (AV) GORETEX  GRAFT Left 08/23/2002; 09/13/2002; 07/08/2003   [name] 06/02/2010; [name] 06/02/2010; [name] 06/02/2010    There were no vitals filed for this visit.  Subjective Assessment - 07/04/17 1038    Subjective   I haven't gotten my ball yet     Pertinent History  s/p C5/6/7 anterior corpectomy (with strut graft and anterior plating) and posterior C3-T1 arthrodesis on 05/24/17.  Pt with PMH that includes:  Arthritis, ESRD, s/p kidney transplant 01/21/17, HTN, Hypotyroidism, heart murmur, anxiety, history of C3-C6 posterior decompression 2018    Limitations  Pt reports that she called MD office regarding restrictions and reports that she may begin low-range strengthening, ok to perform overhead reaching, no lifting lb limit, cervical brace for comfort    Patient Stated Goals  improve coordination/strength UEs, return to work    Currently in Pain?  Yes    Pain Score  4     Pain Location  Neck    Pain Orientation  Posterior    Pain Descriptors / Indicators  Aching    Pain Type  Surgical pain;Chronic pain    Pain Onset  More than a month ago    Pain Frequency  Constant    Aggravating Factors   turning    Pain Relieving Factors  tylenol, after PT          Placing small pegs in pegboard to copy design with each hand with min difficulty/incr time.  Removing with in-hand manipulation with min difficulty/drops.  Min v.c. For shoulder hike.                 OT Education - 07/04/17 1112    Education Details  Shoulder HEP (yellow band, ball, cane)--see pt instructions.  Pt cautioned/cued for shoulder hike/avoid neck activation    Person(s) Educated  Patient    Methods  Explanation;Demonstration;Verbal cues;Handout    Comprehension  Verbalized understanding;Returned demonstration;Verbal cues required;Need further instruction min-mod cueing for compensation/proper positioning   min-mod cueing for compensation/proper positioning      OT Short Term Goals - 06/22/17 1351      OT SHORT TERM GOAL #1   Title  Pt will be independent with initial HEP.--check STGs 07/22/17    Time  4    Period  Weeks    Status  New      OT SHORT TERM GOAL #2   Title  Pt will improve coordination for ADLs as shown by completing 9-hole peg test in less  than 22sec with R hand and 24sec with L hand.     Baseline  R-24.44, L-29.47sec    Time  4    Period  Weeks    Status  New      OT SHORT TERM GOAL #3   Title  Pt will be able to perform overhead reaching to retrieve light object without compensation (if cleared by physician).    Baseline  75% with compensation    Time  4    Period  Weeks    Status  New      OT SHORT TERM GOAL #4   Title  Pt will improve bilateral grip strength by at least 5lbs to assist with ADLs (opening containers, gripping).    Baseline  R-35lbs, L-32lbs    Time  4    Period  Weeks    Status  New      OT SHORT TERM GOAL #5   Title  Pt will improve lateral pinch strength by at least 4lbs bilaterally for work tasks/ADLs.    Baseline  R-7lbs, L-6lbs    Time  4    Period  Weeks    Status  New        OT Long Term Goals - 06/22/17 1507      OT LONG TERM GOAL #1   Title  Pt will be independent with updated HEP.--check LTGs 08/22/17    Time  12    Period  Weeks    Status  New      OT LONG TERM GOAL #2   Title  Pt will be able to retrieve at least 2lb object from overhead shelf with each UE safely.    Time  12    Period  Weeks    Status  New      OT LONG TERM GOAL #3   Title  Pt will perform simulated fine motor work tasks (typing, writing, preparing slides, etc) with min difficulty/sufficient for return to work.    Time  12    Period  Weeks    Status  New      OT LONG TERM GOAL #4   Title  Pt will improve 3point pinch strength by at least 4lbs bilaterally for work tasks/ADLs.    Baseline  R-7bs, L-6lbs    Time  12    Period  Weeks    Status  New      OT LONG TERM GOAL #5   Title  Pt will improve bilateral grip strength by at least 10lbs to assist with ADLs (opening containers, gripping).    Baseline  R-35lbs, L-32lbs    Time  12    Period  Weeks    Status  New      OT LONG TERM GOAL #6   Title  Pt will be able to perform mod complex IADLs (vacuuming, carrying heavier groceries, etc).  mod I.     Time  12    Period  Weeks    Status  New      OT LONG TERM GOAL #7   Title  --    Baseline  --    Time  --    Period  --    Status  --            Plan - 07/04/17 1041    Clinical Impression Statement  Pt is progressing towards goals.  Pt with shoulder tightness and tends to hike shoulder which is likely contributing to neck tightness.  Emphasized proper positioning with exercises.  Pt responded well to cues.    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      Occupational performance deficits (Please refer to evaluation for details):  ADL's;IADL's;Work;Leisure    Rehab Potential  Good    OT Frequency  2x / week    OT Duration  12 weeks +eval    OT Treatment/Interventions  Self-care/ADL training;Therapeutic exercise;Moist Heat;Electrical Stimulation;Fluidtherapy;Paraffin;Neuromuscular education;Splinting;Patient/family education;Energy conservation;Therapist, nutritional;Therapeutic activities;Cryotherapy;Ultrasound;DME and/or AE instruction;Manual Therapy;Passive range of motion;Balance training    Plan  continue with shoulder stretching (review HEP), coordination     Clinical Decision Making  Several treatment options, min-mod task modification necessary  OT Home Exercise Plan  Education provided:  Red putty HEP, Coordination HEP, shoulder HEP    Consulted and Agree with Plan of Care  Patient       Patient will benefit from skilled therapeutic intervention in order to improve the following deficits and impairments:  Decreased knowledge of use of DME, Pain, Decreased coordination, Decreased mobility, Impaired sensation, Decreased strength, Decreased range of motion, Decreased endurance, Decreased activity tolerance, Decreased balance, Decreased knowledge of  precautions, Impaired perceived functional ability, Impaired UE functional use  Visit Diagnosis: Muscle weakness (generalized)  Unsteadiness on feet  Other abnormalities of gait and mobility  Other lack of coordination    Problem List Patient Active Problem List   Diagnosis Date Noted  . 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 S/P spinal surgery 05/24/2017  . Other secondary kyphosis, cervical region 05/24/2017  . Anxiety 11/21/2016  . Stenosis of cervical spine with myelopathy ([place]) 08/12/2016  . Routine general medical examination at a health care facility 06/01/2016  . Numbness and tingling in both hands 05/18/2015  . Hypothyroidism 03/24/2015  . History of tachycardia 03/24/2015  . HTN (hypertension), benign 06/12/2013  . ESRD on dialysis [facility]) 06/12/2013    [facility] 07/04/2017, 11:19 AM  [facility] [address] [place], [place], [address] Phone: [phone]   Fax:  [phone]  Name: [name] MRN: [id number] Date of Birth: 09/03/1960   Vianne Bulls, OTR/L [facility] [address] [place],   [address] [phone] phone [phone] 07/04/17 11:19 AM

## 2017-07-04 NOTE — Patient Instructions (Signed)
   Lay down.  Hold a ball/shoe box/pillow (shoulder width sized)  with arms straight. Slowly move arms up in an arc with elbows straight and then back down. Repeat 15 times.  Do 2 sets per day.     ROM: Abduction - Wand   Holding wand with left hand palm up, push wand directly out to side, leading with other hand palm down, until stretch is felt. Hold 3 seconds. Repeat 10 times per set. Do 2 sessions per day. (Lying down)   ROM: Extension - Wand (Standing)   Stand holding wand behind back. Raise arms as far as possible.  PALMS UP Repeat 10 times per set.  Do 2 sessions per day.    Active Assistive Shoulder External Rotation    SIT with stick at waist level, LEFT palm up, other palm down. Push forearm out from body with hand palm down, and keep elbows bent. Hold. Return to start position. Perform 10 reps. 2X DAY     Scapular Retraction: Rowing (Eccentric) - Arms - Side (Resistance Band)    Hold end of band in each hand. Pull back until elbows are even with trunk. Keep elbows by sides, thumbs up. Slowly release for 3-5 seconds. Use yellow resistance band. 10 reps per set, 1-2sets per day    Strengthening: Resisted Extension   Attach one end to door.  Hold tubing in one hand, arm forward. Pull arm back, elbow straight. Repeat 10 times per set. Do 1-2 sessions per day.  Keep shoulder down/relax neck.     Elbow Extension: Resisted   Hold band in left hand with elbow bent. Straighten RIGHT elbow. Repeat 10 times per set.  Do 1-2 sessions per day.   **REPEAT ALL WITH BOTH ARMS.

## 2017-07-06 ENCOUNTER — Ambulatory Visit: Payer: Medicare Other | Admitting: Physical Therapy

## 2017-07-06 ENCOUNTER — Ambulatory Visit: Payer: Medicare Other | Admitting: Internal Medicine

## 2017-07-06 ENCOUNTER — Encounter: Payer: Self-pay | Admitting: Physical Therapy

## 2017-07-06 DIAGNOSIS — R278 Other lack of coordination: Secondary | ICD-10-CM | POA: Diagnosis not present

## 2017-07-06 DIAGNOSIS — Z0289 Encounter for other administrative examinations: Secondary | ICD-10-CM

## 2017-07-06 DIAGNOSIS — M542 Cervicalgia: Secondary | ICD-10-CM

## 2017-07-06 DIAGNOSIS — M6281 Muscle weakness (generalized): Secondary | ICD-10-CM

## 2017-07-06 DIAGNOSIS — R2681 Unsteadiness on feet: Secondary | ICD-10-CM

## 2017-07-06 DIAGNOSIS — R2689 Other abnormalities of gait and mobility: Secondary | ICD-10-CM | POA: Diagnosis not present

## 2017-07-06 NOTE — Patient Instructions (Addendum)
Access Code: [email or web address]  URL: [email or web address]  Date: 07/06/2017  Prepared by: Willow Ora   Exercises  Seated Cervical Rotation AROM - 10 reps - 1 sets - 5 hold - 1x daily - 7x weekly  Supine Cervical Retraction with Towel - 10 reps - 1 sets - 5 hold - 1x daily - 7x weekly  Seated Levator Scapulae Stretch - 3 reps - 1 sets - 30 hold - 1x daily - 7x weekly  Seated Upper Trapezius Stretch - 3 reps - 1 sets - 30 hold - 1x daily - 7x weekly

## 2017-07-07 DIAGNOSIS — K5730 Diverticulosis of large intestine without perforation or abscess without bleeding: Secondary | ICD-10-CM | POA: Diagnosis not present

## 2017-07-07 DIAGNOSIS — Z4822 Encounter for aftercare following kidney transplant: Secondary | ICD-10-CM | POA: Diagnosis not present

## 2017-07-07 DIAGNOSIS — K56600 Partial intestinal obstruction, unspecified as to cause: Secondary | ICD-10-CM | POA: Diagnosis not present

## 2017-07-07 DIAGNOSIS — R1013 Epigastric pain: Secondary | ICD-10-CM | POA: Diagnosis not present

## 2017-07-07 DIAGNOSIS — R112 Nausea with vomiting, unspecified: Secondary | ICD-10-CM | POA: Diagnosis not present

## 2017-07-07 DIAGNOSIS — Z5181 Encounter for therapeutic drug level monitoring: Secondary | ICD-10-CM | POA: Diagnosis not present

## 2017-07-07 DIAGNOSIS — I10 Essential (primary) hypertension: Secondary | ICD-10-CM | POA: Diagnosis not present

## 2017-07-07 DIAGNOSIS — Z792 Long term (current) use of antibiotics: Secondary | ICD-10-CM | POA: Diagnosis not present

## 2017-07-07 DIAGNOSIS — K56699 Other intestinal obstruction unspecified as to partial versus complete obstruction: Secondary | ICD-10-CM | POA: Diagnosis not present

## 2017-07-07 DIAGNOSIS — N2889 Other specified disorders of kidney and ureter: Secondary | ICD-10-CM | POA: Diagnosis not present

## 2017-07-07 DIAGNOSIS — Z940 Kidney transplant status: Secondary | ICD-10-CM | POA: Diagnosis not present

## 2017-07-07 DIAGNOSIS — Z87891 Personal history of nicotine dependence: Secondary | ICD-10-CM | POA: Diagnosis not present

## 2017-07-07 DIAGNOSIS — R1084 Generalized abdominal pain: Secondary | ICD-10-CM | POA: Diagnosis not present

## 2017-07-07 DIAGNOSIS — Z7952 Long term (current) use of systemic steroids: Secondary | ICD-10-CM | POA: Diagnosis not present

## 2017-07-07 DIAGNOSIS — Z79899 Other long term (current) drug therapy: Secondary | ICD-10-CM | POA: Diagnosis not present

## 2017-07-07 NOTE — Therapy (Signed)
[facility] [address] [place], [place], [address] Phone: [phone]   Fax:  [phone]  Physical Therapy Treatment  Patient Details  Name: [name] MRN: [id number] Date of Birth: 10-19-1960 Referring Provider: Dr. Ashok Pall   Encounter Date: 07/06/2017     07/06/17 0852  PT Visits / Re-Eval  Visit Number 5  Number of Visits 17  Date for PT Re-Evaluation 08/21/17  Authorization  Authorization Type Medicare  PT Time Calculation  PT Start Time 0847  PT Stop Time 0930  PT Time Calculation (min) 43 min  PT - End of Session  Equipment Utilized During Treatment Gait belt  Activity Tolerance Patient tolerated treatment well  Behavior During Therapy [facility] for tasks assessed/performed    Past Medical History:  Diagnosis Date  . Anemia   . Anxiety   . Arthritis   . Claustrophobia   . ESRD (end stage renal disease) on dialysis ([place])    "TTS; [place]" (03/24/2015)  . Glomerulonephritis   . Heart murmur   . HTN (hypertension)   . Hypothyroidism   . Kidney transplant recipient 01/21/2017  . Pancreatitis   . Renal insufficiency   . Secondary hyperparathyroidism ([place])    [name] 05/11/2014    Past Surgical History:  Procedure Laterality Date  . ANTERIOR CERVICAL CORPECTOMY N/A 05/24/2017   Procedure: CORPECTOMY CERVICAL FIVE- CERVICAL SIX;  Surgeon: Ashok Pall, MD;  Location: [facility];  Service: Neurosurgery;  Laterality: N/A;  . ARTERIOVENOUS GRAFT PLACEMENT Right 1990's?  . ARTERIOVENOUS GRAFT PLACEMENT Left 07/2002   upper arm/notes 06/02/2010  . ARTERIOVENOUS GRAFT PLACEMENT Right 07/2003   upper arm/notes 06/02/2010  . AV FISTULA PLACEMENT Left 09/2000   upper arm/notes 06/02/2010  . BREAST BIOPSY Left unsure   benign  . COLONOSCOPY    . DILATATION & CURRETTAGE/HYSTEROSCOPY WITH RESECTOCOPE N/A 04/17/2013   Procedure: DILATATION & CURETTAGE/HYSTEROSCOPY WITH RESECTOCOPE;  Surgeon:  Marvene Staff, MD;  Location: [facility];  Service: Gynecology;  Laterality: N/A;  . DILATION AND CURETTAGE OF UTERUS    . EYE SURGERY    . KIDNEY TRANSPLANT  1997  . KIDNEY TRANSPLANT  01/21/2017   Dr. Harrington Challenger  . PARATHYROIDECTOMY  03/2000 05/12/2014   w/neck exploration & autotransplantation/notes 06/02/2010; w/neck exploration  . PARATHYROIDECTOMY N/A 05/12/2014   Procedure: PARATHYROIDECTOMY AND NECK EXPLORATION;  Surgeon: Armandina Gemma, MD;  Location: [facility];  Service: General;  Laterality: N/A;  . PERITONEAL CATHETER INSERTION    . PERITONEAL CATHETER REMOVAL  08/1999   [name] 06/02/2010  . POSTERIOR CERVICAL FUSION/FORAMINOTOMY N/A 05/30/2017   Procedure: POSTERIOR CERVICAL Arthrodesis Cervical three - four, cervical four - five, cervical five - six, cervical six - seven;  Surgeon: Ashok Pall, MD;  Location: [facility];  Service: Neurosurgery;  Laterality: N/A;  . POSTERIOR CERVICAL LAMINECTOMY N/A 08/12/2016   Procedure: POSTERIOR CERVICAL LAMINECTOMY CERVICAL THREE CERVICAL FOUR, CERVICAL FOUR CERVICAL FIVE, CERVICAL FIVE- CERVICAL SIX, CERVICAL SIX- CERVICAL SEVEN;  Surgeon: Ashok Pall, MD;  Location: [facility];  Service: Neurosurgery;  Laterality: N/A;  POSTERIOR  . RETINAL DETACHMENT SURGERY Left   . THROMBECTOMY Left 02/2002   fistula/notes 06/02/2010  . THROMBECTOMY / ARTERIOVENOUS GRAFT REVISION  11/2003; 01/2004; 08/07/2005; 08/10/2005; 10/2005   [name] 06/02/2010  . THROMBECTOMY AND REVISION OF ARTERIOVENTOUS (AV) GORETEX  GRAFT Right 01/2002; 11/2003; 01/2004; 08/07/2004; 08/10/2004; 11/13/2005   [name] 5/1/2012Marland Kitchen [name] 5/16/2012Marland Kitchen [name] 5/16/2012Marland Kitchen [name] 5/16/2012Marland Kitchen [name] 06/02/2010; [name] 06/02/2010  . THROMBECTOMY AND REVISION OF  ARTERIOVENTOUS (AV) GORETEX  GRAFT Left 08/23/2002; 09/13/2002; 07/08/2003   [name] 06/02/2010; [name] 06/02/2010; [name] 06/02/2010    There were no vitals filed for this visit.     07/06/17 0850  Symptoms/Limitations  Subjective No new complaints. No falls. Feels the  cervical pain is getting better, does still have the tightness.   Pertinent History s/p kidney transplant 01/21/17  Limitations House hold activities;Walking  How long can you walk comfortably? Is able to ambulate approx 5-15 mins before she notices R foot dragging.   Patient Stated Goals "I want to be able to walk without feeling like I"m getting ready to fall."  Pain Assessment  Currently in Pain? Yes  Pain Score 4  Pain Location Neck  Pain Orientation Posterior  Pain Descriptors / Indicators Aching;Tightness  Pain Type Chronic pain;Surgical pain  Pain Onset More than a month ago  Pain Frequency Constant  Aggravating Factors  turning head  Pain Relieving Factors tylenol, stretching, manual therapy in PT       [facility] Adult PT Treatment/Exercise - 07/06/17 2057      Self-Care   Self-Care  Other Self-Care Comments    Other Self-Care Comments   issued HEP for cervical stretching/strengthening      Exercises   Other Exercises   in hooklying position: cervical retraction for 5 sec holds x 10 reps, scapular retraction with 5 sec holds x 10 reps, cervical nods/shakes x 10 reps each in pain free ranges. cues on ex form needed with all ex's performed.       Manual Therapy   Manual Therapy  Soft tissue mobilization;Myofascial release;Neural Stretch;Manual Traction    Manual therapy comments  Manual therapy performed to decrease pain and improve flexibility and cervical and thoracic spine.     Soft tissue mobilization  to bil upper traps, scalenes, levators and cervical paraspinals.             Myofascial Release  to bil cervical paraspinals, upper traps, rhomboids and scalenes.      Manual Traction  suboccipital release for 1 minute holds x 2 reps; gentle cervical distraction for increased muslce extensibility     Neural Stretch  concurrent with gentle manual distraction of cervical spine- passive overpressure to shoulder in inferior direction for increased passive stretching for 1 minute hold  x 2 on each side, then added passive overpressure for scapular depression with retraction concurrent with gentle cervical distraction for increased stretching of muscular tissue for 45 sec holds x 3 each side.                       [facility] PT Assessment - 07/06/17 2117      AROM   Overall AROM   Deficits    Overall AROM Comments  used inclinometer for all measurments.     AROM Assessment Site  Cervical    Cervical Flexion  40    Cervical Extension  25    Cervical - Right Side Bend  35    Cervical - Left Side Bend  30    Cervical - Right Rotation  35    Cervical - Left Rotation  50           PT Education - 07/06/17 2056    Education Details  HEP for cervical stretching and ROM    Person(s) Educated  Patient    Methods  Explanation;Demonstration;Verbal cues;Handout    Comprehension  Verbalized understanding;Need further instruction;Verbal cues required  Access Code: [email or web address]  URL: [email or web address]  Date: 07/06/2017  Prepared by: Willow Ora   PT Short Term Goals - 06/22/17 1228      PT SHORT TERM GOAL #1   Title  Pt will be independent with initial HEP in order to indicate improved functional mobility and decreased fall risk.  (Target Date: 07/22/17)    Time  4    Period  Weeks    Status  New    Target Date  07/22/17      PT SHORT TERM GOAL #2   Title  Pt will improve 5TSS to </=16.50 secs without UE support in order to indicate decreased fall risk and improved functional strength.      Time  4    Period  Weeks    Status  New      PT SHORT TERM GOAL #3   Title  Pt will improve FGA to 19/30 in order to indicate decreased fall risk.      Time  4    Period  Weeks    Status  New      PT SHORT TERM GOAL #4   Title  Pt will ambulate at gait speed of >/=2.62 ft/sec with decreased indication of R foot slap/overt gait deviations indicating safe gait speed.     Time  4    Period  Weeks    Status  New      PT SHORT TERM GOAL #5   Title  Pt will  ambulate outdoors over unlevel paved surfaces up to 1000' at mod I level in order to indicate improved community independence.     Time  4    Period  Weeks    Status  New        PT Long Term Goals - 06/22/17 1232      PT LONG TERM GOAL #1   Title  Pt will be independent with final HEP in order to indicate improved functional mobility and decreased fall risk.  (Target Date: 08/21/17)    Time  8    Period  Weeks    Status  New    Target Date  08/21/17      PT LONG TERM GOAL #2   Title  Pt will improve 5TSS to </=13.50 secs without UE support in order to indicate improved functional strength and decreased fall risk.     Time  8    Period  Weeks    Status  New      PT LONG TERM GOAL #3   Title  Pt will improve FGA to >/=23/30 in order to indicate decreased fall risk.      Time  8    Period  Weeks    Status  New      PT LONG TERM GOAL #4   Title  Pt will ambulate >1000' over varying outdoor surfaces, scanning environment at independent level in order to indicate safe community and leisure mobility.     Time  8    Period  Weeks    Status  New          07/06/17 4008  Plan  Clinical Impression Statement Today's skilled session continued to address cervical pain/tightness and issued HEP to address these issues at home. Pt did report decreased pain/tightness at end of session. Pt is progressing toward goals and should benefit from continued PT to progress toward unmet goals.   Pt will benefit from skilled therapeutic intervention in order to improve  on the following deficits Abnormal gait;Decreased activity tolerance;Decreased balance;Decreased endurance;Decreased mobility;Decreased range of motion;Decreased strength;Hypomobility;Impaired perceived functional ability;Impaired flexibility;Postural dysfunction;Improper body mechanics;Impaired sensation  Rehab Potential Good  Clinical Impairments Affecting Rehab Potential severity of cord compression   PT Frequency 2x / week  PT  Duration 8 weeks  PT Treatment/Interventions ADLs/Self Care Home Management;Electrical Stimulation;Gait training;Stair training;Functional mobility training;Therapeutic activities;Therapeutic exercise;Balance training;Neuromuscular re-education;Patient/family education;Orthotic Fit/Training;Passive range of motion;Manual techniques  PT Next Visit Plan Primary PT to set ROM goals for data collected today;  all therapy without cervical collar,gentle neck ROM as she is driving, continued to address balance on compliant sufaces, dynmic gait   Consulted and Agree with Plan of Care Patient         Patient will benefit from skilled therapeutic intervention in order to improve the following deficits and impairments:  Abnormal gait, Decreased activity tolerance, Decreased balance, Decreased endurance, Decreased mobility, Decreased range of motion, Decreased strength, Hypomobility, Impaired perceived functional ability, Impaired flexibility, Postural dysfunction, Improper body mechanics, Impaired sensation  Visit Diagnosis: Muscle weakness (generalized)  Unsteadiness on feet  Cervicalgia     Problem List Patient Active Problem List   Diagnosis Date Noted  . 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 S/P spinal surgery 05/24/2017  . Other secondary kyphosis, cervical region 05/24/2017  . Anxiety 11/21/2016  . Stenosis of cervical spine with myelopathy ([place]) 08/12/2016  . Routine general medical examination at a health care facility 06/01/2016  . Numbness and tingling in both hands 05/18/2015  . Hypothyroidism 03/24/2015  . History of tachycardia 03/24/2015  . HTN (hypertension), benign 06/12/2013  . ESRD on dialysis [facility]) 06/12/2013    Willow Ora, PTA, [facility] [address] [place], [place] [address] [phone] 07/07/17, 9:24 PM   Name: [name] MRN: [id number] Date of Birth: Aug 01, 1960

## 2017-07-08 DIAGNOSIS — Z940 Kidney transplant status: Secondary | ICD-10-CM | POA: Diagnosis not present

## 2017-07-08 DIAGNOSIS — R112 Nausea with vomiting, unspecified: Secondary | ICD-10-CM | POA: Diagnosis not present

## 2017-07-08 DIAGNOSIS — R109 Unspecified abdominal pain: Secondary | ICD-10-CM | POA: Diagnosis not present

## 2017-07-08 DIAGNOSIS — R1013 Epigastric pain: Secondary | ICD-10-CM | POA: Diagnosis not present

## 2017-07-08 DIAGNOSIS — Z79899 Other long term (current) drug therapy: Secondary | ICD-10-CM | POA: Diagnosis not present

## 2017-07-08 DIAGNOSIS — K56600 Partial intestinal obstruction, unspecified as to cause: Secondary | ICD-10-CM | POA: Diagnosis not present

## 2017-07-09 DIAGNOSIS — R112 Nausea with vomiting, unspecified: Secondary | ICD-10-CM | POA: Diagnosis not present

## 2017-07-09 DIAGNOSIS — R109 Unspecified abdominal pain: Secondary | ICD-10-CM | POA: Diagnosis not present

## 2017-07-09 DIAGNOSIS — Z79899 Other long term (current) drug therapy: Secondary | ICD-10-CM | POA: Diagnosis not present

## 2017-07-09 DIAGNOSIS — Z940 Kidney transplant status: Secondary | ICD-10-CM | POA: Diagnosis not present

## 2017-07-09 DIAGNOSIS — K56600 Partial intestinal obstruction, unspecified as to cause: Secondary | ICD-10-CM | POA: Diagnosis not present

## 2017-07-11 ENCOUNTER — Encounter: Payer: Self-pay | Admitting: Physical Therapy

## 2017-07-11 ENCOUNTER — Ambulatory Visit: Payer: Medicare Other | Admitting: Physical Therapy

## 2017-07-11 ENCOUNTER — Ambulatory Visit: Payer: Medicare Other | Admitting: Occupational Therapy

## 2017-07-11 DIAGNOSIS — M6281 Muscle weakness (generalized): Secondary | ICD-10-CM | POA: Diagnosis not present

## 2017-07-11 DIAGNOSIS — R2689 Other abnormalities of gait and mobility: Secondary | ICD-10-CM

## 2017-07-11 DIAGNOSIS — M542 Cervicalgia: Secondary | ICD-10-CM

## 2017-07-11 DIAGNOSIS — R2681 Unsteadiness on feet: Secondary | ICD-10-CM

## 2017-07-11 DIAGNOSIS — R278 Other lack of coordination: Secondary | ICD-10-CM

## 2017-07-11 MED ORDER — ALPRAZOLAM 0.25 MG PO TABS
0.25 | ORAL_TABLET | ORAL | Status: DC
Start: ? — End: 2017-07-11

## 2017-07-11 MED ORDER — GENERIC EXTERNAL MEDICATION
250.00 | Status: DC
Start: 2017-07-09 — End: 2017-07-11

## 2017-07-11 MED ORDER — LABETALOL HCL 5 MG/ML IV SOLN
10.00 | INTRAVENOUS | Status: DC
Start: ? — End: 2017-07-11

## 2017-07-11 MED ORDER — ACETAMINOPHEN 500 MG PO TABS
500.00 | ORAL_TABLET | ORAL | Status: DC
Start: ? — End: 2017-07-11

## 2017-07-11 MED ORDER — SULFAMETHOXAZOLE-TRIMETHOPRIM 400-80 MG PO TABS
1.00 | ORAL_TABLET | ORAL | Status: DC
Start: ? — End: 2017-07-11

## 2017-07-11 MED ORDER — ASPIRIN EC 81 MG PO TBEC
81.00 | DELAYED_RELEASE_TABLET | ORAL | Status: DC
Start: 2017-07-10 — End: 2017-07-11

## 2017-07-11 MED ORDER — GENERIC EXTERNAL MEDICATION
Status: DC
Start: ? — End: 2017-07-11

## 2017-07-11 MED ORDER — SODIUM BICARBONATE 650 MG PO TABS
1300.00 | ORAL_TABLET | ORAL | Status: DC
Start: 2017-07-09 — End: 2017-07-11

## 2017-07-11 MED ORDER — MYCOPHENOLATE SODIUM 360 MG PO TBEC
720.00 | DELAYED_RELEASE_TABLET | ORAL | Status: DC
Start: 2017-07-09 — End: 2017-07-11

## 2017-07-11 MED ORDER — MG-PLUS PROTEIN 133 MG PO TABS
2.00 | ORAL_TABLET | ORAL | Status: DC
Start: 2017-07-10 — End: 2017-07-11

## 2017-07-11 MED ORDER — RANITIDINE HCL 150 MG PO TABS
150.00 | ORAL_TABLET | ORAL | Status: DC
Start: 2017-07-09 — End: 2017-07-11

## 2017-07-11 MED ORDER — TACROLIMUS 1 MG PO CAPS
3.00 | ORAL_CAPSULE | ORAL | Status: DC
Start: 2017-07-09 — End: 2017-07-11

## 2017-07-11 MED ORDER — NEPHRO-VITE 0.8 MG PO TABS
1.00 | ORAL_TABLET | ORAL | Status: DC
Start: 2017-07-10 — End: 2017-07-11

## 2017-07-11 MED ORDER — PREDNISONE 5 MG PO TABS
5.00 | ORAL_TABLET | ORAL | Status: DC
Start: 2017-07-10 — End: 2017-07-11

## 2017-07-11 MED ORDER — VALGANCICLOVIR HCL 450 MG PO TABS
900.00 | ORAL_TABLET | ORAL | Status: DC
Start: 2017-07-10 — End: 2017-07-11

## 2017-07-11 MED ORDER — FERROUS SULFATE 325 (65 FE) MG PO TABS
325.00 | ORAL_TABLET | ORAL | Status: DC
Start: ? — End: 2017-07-11

## 2017-07-11 MED ORDER — GABAPENTIN 300 MG PO CAPS
600.00 | ORAL_CAPSULE | ORAL | Status: DC
Start: 2017-07-09 — End: 2017-07-11

## 2017-07-11 NOTE — Therapy (Signed)
[facility] [address] [place], [place], [address] Phone: [phone]   Fax:  [phone]  Occupational Therapy Treatment  Patient Details  Name: [name] MRN: [id number] Date of Birth: 1960-08-03 Referring Provider: Dr. Ashok Pall   Encounter Date: 07/11/2017  OT End of Session - 07/11/17 1051    Visit Number  4    Number of Visits  17    Date for OT Re-Evaluation  09/20/17    Authorization Type  Medicare $3000 visit limt    Authorization Time Period  90 day cert. period  (through 09/20/17)    OT Start Time  1037 pt arrived late    OT Stop Time  1102    OT Time Calculation (min)  25 min    Activity Tolerance  Patient tolerated treatment well    Behavior During Therapy  WFL for tasks assessed/performed       Past Medical History:  Diagnosis Date  . Anemia   . Anxiety   . Arthritis   . Claustrophobia   . ESRD (end stage renal disease) on dialysis ([facility])    "TTS; [facility]" (03/24/2015)  . Glomerulonephritis   . Heart murmur   . HTN (hypertension)   . Hypothyroidism   . Kidney transplant recipient 01/21/2017  . Pancreatitis   . Renal insufficiency   . Secondary hyperparathyroidism ([facility])    [name] 05/11/2014    Past Surgical History:  Procedure Laterality Date  . ANTERIOR CERVICAL CORPECTOMY N/A 05/24/2017   Procedure: CORPECTOMY CERVICAL FIVE- CERVICAL SIX;  Surgeon: Ashok Pall, MD;  Location: [facility];  Service: Neurosurgery;  Laterality: N/A;  . ARTERIOVENOUS GRAFT PLACEMENT Right 1990's?  . ARTERIOVENOUS GRAFT PLACEMENT Left 07/2002   upper arm/notes 06/02/2010  . ARTERIOVENOUS GRAFT PLACEMENT Right 07/2003   upper arm/notes 06/02/2010  . AV FISTULA PLACEMENT Left 09/2000   upper arm/notes 06/02/2010  . BREAST BIOPSY Left unsure   benign  . COLONOSCOPY    . DILATATION & CURRETTAGE/HYSTEROSCOPY WITH RESECTOCOPE N/A 04/17/2013   Procedure: DILATATION & CURETTAGE/HYSTEROSCOPY WITH  RESECTOCOPE;  Surgeon: Marvene Staff, MD;  Location: [facility];  Service: Gynecology;  Laterality: N/A;  . DILATION AND CURETTAGE OF UTERUS    . EYE SURGERY    . KIDNEY TRANSPLANT  1997  . KIDNEY TRANSPLANT  01/21/2017   Dr. Harrington Challenger  . PARATHYROIDECTOMY  03/2000 05/12/2014   w/neck exploration & autotransplantation/notes 06/02/2010; w/neck exploration  . PARATHYROIDECTOMY N/A 05/12/2014   Procedure: PARATHYROIDECTOMY AND NECK EXPLORATION;  Surgeon: Armandina Gemma, MD;  Location: [facility];  Service: General;  Laterality: N/A;  . PERITONEAL CATHETER INSERTION    . PERITONEAL CATHETER REMOVAL  08/1999   [name] 06/02/2010  . POSTERIOR CERVICAL FUSION/FORAMINOTOMY N/A 05/30/2017   Procedure: POSTERIOR CERVICAL Arthrodesis Cervical three - four, cervical four - five, cervical five - six, cervical six - seven;  Surgeon: Ashok Pall, MD;  Location: [facility];  Service: Neurosurgery;  Laterality: N/A;  . POSTERIOR CERVICAL LAMINECTOMY N/A 08/12/2016   Procedure: POSTERIOR CERVICAL LAMINECTOMY CERVICAL THREE CERVICAL FOUR, CERVICAL FOUR CERVICAL FIVE, CERVICAL FIVE- CERVICAL SIX, CERVICAL SIX- CERVICAL SEVEN;  Surgeon: Ashok Pall, MD;  Location: [facility];  Service: Neurosurgery;  Laterality: N/A;  POSTERIOR  . RETINAL DETACHMENT SURGERY Left   . THROMBECTOMY Left 02/2002   fistula/notes 06/02/2010  . THROMBECTOMY / ARTERIOVENOUS GRAFT REVISION  11/2003; 01/2004; 08/07/2005; 08/10/2005; 10/2005   [name] 06/02/2010  . THROMBECTOMY AND REVISION OF ARTERIOVENTOUS (AV) GORETEX  GRAFT Right 01/2002; 11/2003; 01/2004; 08/07/2004; 08/10/2004; 11/13/2005   [name] 5/1/2012Marland Kitchen [name] 5/16/2012Marland Kitchen [name] 5/16/2012Marland Kitchen [name] 5/16/2012Marland Kitchen [name] 5/16/2012Marland Kitchen [name] 06/02/2010  . THROMBECTOMY AND REVISION OF ARTERIOVENTOUS (AV) GORETEX  GRAFT Left 08/23/2002; 09/13/2002; 07/08/2003   [name] 06/02/2010; [name] 06/02/2010; [name] 06/02/2010    There were no vitals filed for this visit.  Subjective Assessment - 07/11/17 1046    Subjective   Pt reports  that she was hospitalized over the weekend due to possible pancreatitis, but it was not.  Pt reports no restrictions and that they ok'ed return to therapy    Pertinent History  s/p C5/6/7 anterior corpectomy (with strut graft and anterior plating) and posterior C3-T1 arthrodesis on 05/24/17.  Pt with PMH that includes:  Arthritis, ESRD, s/p kidney transplant 01/21/17, HTN, Hypotyroidism, heart murmur, anxiety, history of C3-C6 posterior decompression 2018    Limitations  Pt reports that she called MD office regarding restrictions and reports that she may begin low-range strengthening, ok to perform overhead reaching, no lifting lb limit, cervical brace for comfort    Patient Stated Goals  improve coordination/strength UEs, return to work    Currently in Pain?  No/denies    Pain Onset  More than a month ago                           OT Education - 07/11/17 1214    Education Details  Reviewed shoulder HEP from last session    Person(s) Educated  Patient    Methods  Explanation;Demonstration;Verbal cues;Tactile cues    Comprehension  Verbalized understanding;Returned demonstration;Verbal cues required min cueing for compensation patterns in neck/shoulder, pt able to correct with cueing   min cueing for compensation patterns in neck/shoulder, pt able to correct with cueing      OT Short Term Goals - 07/11/17 1217      OT SHORT TERM GOAL #1   Title  Pt will be independent with initial HEP.--check STGs 07/22/17    Time  4    Period  Weeks    Status  New      OT SHORT TERM GOAL #2   Title  Pt will improve coordination for ADLs as shown by completing 9-hole peg test in less than 22sec with R hand and 24sec with L hand.     Baseline  R-24.44, L-29.47sec    Time  4    Period  Weeks    Status  New      OT SHORT TERM GOAL #3   Title  Pt will be able to perform overhead reaching to retrieve light object without compensation.    Baseline  75% with compensation    Time  4     Period  Weeks    Status  New      OT SHORT TERM GOAL #4   Title  Pt will improve bilateral grip strength by at least 5lbs to assist with ADLs (opening containers, gripping).    Baseline  R-35lbs, L-32lbs    Time  4    Period  Weeks    Status  New      OT SHORT TERM GOAL #5   Title  Pt will improve lateral pinch strength by at least 4lbs bilaterally for work tasks/ADLs.    Baseline  R-7lbs, L-6lbs    Time  4    Period  Weeks    Status  New        OT Long Term Goals -  06/22/17 1507      OT LONG TERM GOAL #1   Title  Pt will be independent with updated HEP.--check LTGs 08/22/17    Time  12    Period  Weeks    Status  New      OT LONG TERM GOAL #2   Title  Pt will be able to retrieve at least 2lb object from overhead shelf with each UE safely.    Time  12    Period  Weeks    Status  New      OT LONG TERM GOAL #3   Title  Pt will perform simulated fine motor work tasks (typing, writing, preparing slides, etc) with min difficulty/sufficient for return to work.    Time  12    Period  Weeks    Status  New      OT LONG TERM GOAL #4   Title  Pt will improve 3point pinch strength by at least 4lbs bilaterally for work tasks/ADLs.    Baseline  R-7bs, L-6lbs    Time  12    Period  Weeks    Status  New      OT LONG TERM GOAL #5   Title  Pt will improve bilateral grip strength by at least 10lbs to assist with ADLs (opening containers, gripping).    Baseline  R-35lbs, L-32lbs    Time  12    Period  Weeks    Status  New      OT LONG TERM GOAL #6   Title  Pt will be able to perform mod complex IADLs (vacuuming, carrying heavier groceries, etc).  mod I.    Time  12    Period  Weeks    Status  New      OT LONG TERM GOAL #7   Title  --    Baseline  --    Time  --    Period  --    Status  --            Plan - 07/11/17 1215    Clinical Impression Statement  Pt progressing towards goals.  Pt demo less shoulder/neck compensation today.  Continue to emphasize proper  positioning/movement quality with exercise.    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      Occupational performance deficits (Please refer to evaluation for details):  ADL's;IADL's;Work;Leisure    Rehab Potential  Good    OT Frequency  2x / week    OT Duration  12 weeks +eval    OT Treatment/Interventions  Self-care/ADL training;Therapeutic exercise;Moist Heat;Electrical Stimulation;Fluidtherapy;Paraffin;Neuromuscular education;Splinting;Patient/family education;Energy conservation;Therapist, nutritional;Therapeutic activities;Cryotherapy;Ultrasound;DME and/or AE instruction;Manual Therapy;Passive range of motion;Balance training    Plan  coordination, gentle strengthening     Clinical Decision Making  Several treatment options, min-mod task modification necessary    OT Home Exercise Plan  Education provided:  Red putty HEP, Coordination HEP, shoulder HEP    Consulted and Agree with Plan of Care  Patient       Patient will benefit from skilled therapeutic intervention in order to improve the following deficits and impairments:  Decreased knowledge of use of DME, Pain, Decreased coordination, Decreased mobility, Impaired sensation, Decreased strength, Decreased range of motion, Decreased endurance, Decreased activity tolerance, Decreased balance, Decreased knowledge of precautions, Impaired  perceived functional ability, Impaired UE functional use  Visit Diagnosis: Muscle weakness (generalized)  Other abnormalities of gait and mobility  Other lack of coordination  Unsteadiness on feet    Problem List Patient Active Problem List   Diagnosis Date Noted  . 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 S/P spinal surgery 05/24/2017  . Other secondary kyphosis, cervical region  05/24/2017  . Anxiety 11/21/2016  . Stenosis of cervical spine with myelopathy ([facility]) 08/12/2016  . Routine general medical examination at a health care facility 06/01/2016  . Numbness and tingling in both hands 05/18/2015  . Hypothyroidism 03/24/2015  . History of tachycardia 03/24/2015  . HTN (hypertension), benign 06/12/2013  . ESRD on dialysis [facility]) 06/12/2013    [facility] 07/11/2017, 12:18 PM  [facility] [address] [place], [place], [address] Phone: [phone]   Fax:  [phone]  Name: [name] MRN: [id number] Date of Birth: 11-Mar-1960   Vianne Bulls, OTR/L [facility] [address] [place], [place]  [address] [phone] phone [phone] 07/11/17 12:19 PM

## 2017-07-11 NOTE — Therapy (Signed)
[facility] [address] [place], [place], [address] Phone: [phone]   Fax:  [phone]  Physical Therapy Treatment  Patient Details  Name: [name] MRN: [id number] Date of Birth: 07/09/1960 Referring Provider: Dr. Ashok Pall   Encounter Date: 07/11/2017  PT End of Session - 07/11/17 1308    Visit Number  6    Number of Visits  17    Date for PT Re-Evaluation  08/21/17    Authorization Type  Medicare    PT Start Time  1150    PT Stop Time  1230    PT Time Calculation (min)  40 min    Equipment Utilized During Treatment  Gait belt    Activity Tolerance  Patient tolerated treatment well    Behavior During Therapy  [facility] for tasks assessed/performed       Past Medical History:  Diagnosis Date  . Anemia   . Anxiety   . Arthritis   . Claustrophobia   . ESRD (end stage renal disease) on dialysis ([facility])    "TTS; [facility]" (03/24/2015)  . Glomerulonephritis   . Heart murmur   . HTN (hypertension)   . Hypothyroidism   . Kidney transplant recipient 01/21/2017  . Pancreatitis   . Renal insufficiency   . Secondary hyperparathyroidism ([facility])    [name] 05/11/2014    Past Surgical History:  Procedure Laterality Date  . ANTERIOR CERVICAL CORPECTOMY N/A 05/24/2017   Procedure: CORPECTOMY CERVICAL FIVE- CERVICAL SIX;  Surgeon: Ashok Pall, MD;  Location: [facility];  Service: Neurosurgery;  Laterality: N/A;  . ARTERIOVENOUS GRAFT PLACEMENT Right 1990's?  . ARTERIOVENOUS GRAFT PLACEMENT Left 07/2002   upper arm/notes 06/02/2010  . ARTERIOVENOUS GRAFT PLACEMENT Right 07/2003   upper arm/notes 06/02/2010  . AV FISTULA PLACEMENT Left 09/2000   upper arm/notes 06/02/2010  . BREAST BIOPSY Left unsure   benign  . COLONOSCOPY    . DILATATION & CURRETTAGE/HYSTEROSCOPY WITH RESECTOCOPE N/A 04/17/2013   Procedure: DILATATION & CURETTAGE/HYSTEROSCOPY WITH RESECTOCOPE;  Surgeon: Marvene Staff, MD;  Location: [facility];  Service: Gynecology;  Laterality: N/A;  . DILATION AND CURETTAGE OF UTERUS    . EYE SURGERY    . KIDNEY TRANSPLANT  1997  . KIDNEY TRANSPLANT  01/21/2017   Dr. Harrington Challenger  . PARATHYROIDECTOMY  03/2000 05/12/2014   w/neck exploration & autotransplantation/notes 06/02/2010; w/neck exploration  . PARATHYROIDECTOMY N/A 05/12/2014   Procedure: PARATHYROIDECTOMY AND NECK EXPLORATION;  Surgeon: Armandina Gemma, MD;  Location: [facility];  Service: General;  Laterality: N/A;  . PERITONEAL CATHETER INSERTION    . PERITONEAL CATHETER REMOVAL  08/1999   [name] 06/02/2010  . POSTERIOR CERVICAL FUSION/FORAMINOTOMY N/A 05/30/2017   Procedure: POSTERIOR CERVICAL Arthrodesis Cervical three - four, cervical four - five, cervical five - six, cervical six - seven;  Surgeon: Ashok Pall, MD;  Location: [facility];  Service: Neurosurgery;  Laterality: N/A;  . POSTERIOR CERVICAL LAMINECTOMY N/A 08/12/2016   Procedure: POSTERIOR CERVICAL LAMINECTOMY CERVICAL THREE CERVICAL FOUR, CERVICAL FOUR CERVICAL FIVE, CERVICAL FIVE- CERVICAL SIX, CERVICAL SIX- CERVICAL SEVEN;  Surgeon: Ashok Pall, MD;  Location: [facility];  Service: Neurosurgery;  Laterality: N/A;  POSTERIOR  . RETINAL DETACHMENT SURGERY Left   . THROMBECTOMY Left 02/2002   fistula/notes 06/02/2010  . THROMBECTOMY / ARTERIOVENOUS GRAFT REVISION  11/2003; 01/2004; 08/07/2005; 08/10/2005; 10/2005   [name] 06/02/2010  . THROMBECTOMY AND REVISION OF ARTERIOVENTOUS (AV) GORETEX  GRAFT Right 01/2002; 11/2003; 01/2004; 08/07/2004; 08/10/2004; 11/13/2005   /  notes 05/18/2010; [name] 5/16/2012Marland Kitchen [name] 5/16/2012Marland Kitchen [name] 5/16/2012Marland Kitchen [name] 5/16/2012Marland Kitchen [name] 06/02/2010  . THROMBECTOMY AND REVISION OF ARTERIOVENTOUS (AV) GORETEX  GRAFT Left 08/23/2002; 09/13/2002; 07/08/2003   [name] 06/02/2010; [name] 06/02/2010; [name] 06/02/2010    There were no vitals filed for this visit.  Subjective Assessment - 07/11/17 1153    Subjective  No new complaints. No falls.  Still has tightness in neck. Pt stated that she  went to the hospital over the weekend due to kidney issue.    Currently in Pain?  No/denies            [facility] Adult PT Treatment/Exercise - 07/11/17 1254      Ambulation/Gait   Ambulation/Gait  Yes    Ambulation/Gait Assistance  4: Min guard;4: Min assist    Assistive device  None    Gait Comments  in hallway pt ambulated while looking side<>side. Pt had slow gt speed while performing dynamic gait.      Neuro Re-ed    Neuro Re-ed Details   in corner on 1 inch foam with chair in front for saftey; narrow base of support with EO head movements left<>right, then up<>down with min guard asisit for balance, occasional UE support. cervical motions performed within pain free ranges only.      Exercises   Exercises  --    Other Exercises   in hooklying position: cervical retraction for 5 sec holds x 10 reps, scapular retraction with 5 sec holds x 10 reps, cervical nods/shakes x 10 reps each in pain free ranges. cues on ex form needed with all ex's performed.       Neck Exercises: Theraband   Scapula Retraction  10 reps      Neck Exercises: Seated   Cervical Rotation  Both;10 reps;Other (comment) 5 sec hold    Other Seated Exercise  Cervical flexion/extension x 10 reps with 2-3 sec holds.  Added to HEP.       Manual Therapy   Manual Therapy  Soft tissue mobilization;Myofascial release;Neural Stretch;Manual Traction    Manual therapy comments  all maunal therapy performed for decreased tightness of cervical and thoracic area.     Soft tissue mobilization  to bil upper traps, scalenes, levators and cervical paraspinals.             Myofascial Release  to bil cervical paraspinals, upper traps, rhomboids and scalenes.      Manual Traction  suboccipital release for 1 minute holds x 2 reps; gentle cervical distraction for increased muslce extensibility     Neural Stretch  concurrent with gentle manual distraction of cervical spine- passive overpressure to shoulder in inferior direction for  increased passive stretching for 1 minute hold x 2 on each side      Neck Exercises: Stretches   Upper Trapezius Stretch  3 reps;30 seconds;Left;Right    Levator Stretch  30 seconds;Right;Left;3 reps               PT Short Term Goals - 06/22/17 1228      PT SHORT TERM GOAL #1   Title  Pt will be independent with initial HEP in order to indicate improved functional mobility and decreased fall risk.  (Target Date: 07/22/17)    Time  4    Period  Weeks    Status  New    Target Date  07/22/17      PT SHORT TERM GOAL #2   Title  Pt will improve 5TSS to </=16.50 secs without UE support  in order to indicate decreased fall risk and improved functional strength.      Time  4    Period  Weeks    Status  New      PT SHORT TERM GOAL #3   Title  Pt will improve FGA to 19/30 in order to indicate decreased fall risk.      Time  4    Period  Weeks    Status  New      PT SHORT TERM GOAL #4   Title  Pt will ambulate at gait speed of >/=2.62 ft/sec with decreased indication of R foot slap/overt gait deviations indicating safe gait speed.     Time  4    Period  Weeks    Status  New      PT SHORT TERM GOAL #5   Title  Pt will ambulate outdoors over unlevel paved surfaces up to 1000' at mod I level in order to indicate improved community independence.     Time  4    Period  Weeks    Status  New        PT Long Term Goals - 06/22/17 1232      PT LONG TERM GOAL #1   Title  Pt will be independent with final HEP in order to indicate improved functional mobility and decreased fall risk.  (Target Date: 08/21/17)    Time  8    Period  Weeks    Status  New    Target Date  08/21/17      PT LONG TERM GOAL #2   Title  Pt will improve 5TSS to </=13.50 secs without UE support in order to indicate improved functional strength and decreased fall risk.     Time  8    Period  Weeks    Status  New      PT LONG TERM GOAL #3   Title  Pt will improve FGA to >/=23/30 in order to indicate decreased  fall risk.      Time  8    Period  Weeks    Status  New      PT LONG TERM GOAL #4   Title  Pt will ambulate >1000' over varying outdoor surfaces, scanning environment at independent level in order to indicate safe community and leisure mobility.     Time  8    Period  Weeks    Status  New         Plan - 07/11/17 1310    Clinical Impression Statement  Today's skilled session continues to address cervical tightness as pt reported she did not have any pain today. Remainder of session continued to address balance reactions with no issues reported. Pt does continue to be challenged by compliant surfaces and with vision removed. Pt is progressing toward goals and should benefit from continued PT to progress toward unmet goals.    Rehab Potential  Good    Clinical Impairments Affecting Rehab Potential  severity of cord compression     PT Frequency  2x / week    PT Duration  8 weeks    PT Treatment/Interventions  ADLs/Self Care Home Management;Electrical Stimulation;Gait training;Stair training;Functional mobility training;Therapeutic activities;Therapeutic exercise;Balance training;Neuromuscular re-education;Patient/family education;Orthotic Fit/Training;Passive range of motion;Manual techniques    PT Next Visit Plan  Primary PT to set ROM goals for data collected today;  all therapy without cervical collar,gentle neck ROM as she is driving, continued to address balance on compliant sufaces, dynmic gait  Consulted and Agree with Plan of Care  Patient            Patient will benefit from skilled therapeutic intervention in order to improve the following deficits and impairments:  Abnormal gait, Decreased activity tolerance, Decreased balance, Decreased endurance, Decreased mobility, Decreased range of motion, Decreased strength, Hypomobility, Impaired perceived functional ability, Impaired flexibility, Postural dysfunction, Improper body mechanics, Impaired sensation  Visit  Diagnosis: Muscle weakness (generalized)  Unsteadiness on feet  Cervicalgia     Problem List Patient Active Problem List   Diagnosis Date Noted  . 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 S/P spinal surgery 05/24/2017  . Other secondary kyphosis, cervical region 05/24/2017  . Anxiety 11/21/2016  . Stenosis of cervical spine with myelopathy ([place]) 08/12/2016  . Routine general medical examination at a health care facility 06/01/2016  . Numbness and tingling in both hands 05/18/2015  . Hypothyroidism 03/24/2015  . History of tachycardia 03/24/2015  . HTN (hypertension), benign 06/12/2013  . ESRD on dialysis [facility]) 06/12/2013   Halina Andreas, [place] 07/12/2017, 1:19 PM  [facility] [address] [place], [place], [address] Phone: [phone]   Fax:  [phone]  Name: ANSLIE SPADAFORA MRN: [id number] Date of Birth: 1960-06-18  This note has been reviewed and edited by supervising CI.  Willow Ora, PTA, [facility] [address] [place], [place] [address] [phone] 07/12/17, 1:27 PM

## 2017-07-13 ENCOUNTER — Ambulatory Visit: Payer: Medicare Other | Admitting: Physical Therapy

## 2017-07-13 ENCOUNTER — Encounter: Payer: Self-pay | Admitting: Physical Therapy

## 2017-07-13 ENCOUNTER — Encounter: Payer: Self-pay | Admitting: Occupational Therapy

## 2017-07-13 ENCOUNTER — Ambulatory Visit: Payer: Medicare Other | Admitting: Occupational Therapy

## 2017-07-13 DIAGNOSIS — R2689 Other abnormalities of gait and mobility: Secondary | ICD-10-CM | POA: Diagnosis not present

## 2017-07-13 DIAGNOSIS — M6281 Muscle weakness (generalized): Secondary | ICD-10-CM

## 2017-07-13 DIAGNOSIS — R278 Other lack of coordination: Secondary | ICD-10-CM

## 2017-07-13 DIAGNOSIS — R2681 Unsteadiness on feet: Secondary | ICD-10-CM

## 2017-07-13 DIAGNOSIS — M542 Cervicalgia: Secondary | ICD-10-CM | POA: Diagnosis not present

## 2017-07-13 NOTE — Therapy (Signed)
[facility] [address] [place], [place], [address] Phone: [phone]   Fax:  [phone]  Occupational Therapy Treatment  Patient Details  Name: [name] MRN: [id number] Date of Birth: July 03, 1960 Referring Provider: Dr. Ashok Pall   Encounter Date: 07/13/2017  OT End of Session - 07/13/17 1020    Visit Number  5    Number of Visits  17    Date for OT Re-Evaluation  09/20/17    Authorization Type  Medicare $3000 visit limt    Authorization Time Period  90 day cert. period  (through 09/20/17)    Authorization - Visit Number  5    Authorization - Number of Visits  10    OT Start Time  1018    OT Stop Time  1100    OT Time Calculation (min)  42 min    Activity Tolerance  Patient tolerated treatment well    Behavior During Therapy  WFL for tasks assessed/performed       Past Medical History:  Diagnosis Date  . Anemia   . Anxiety   . Arthritis   . Claustrophobia   . ESRD (end stage renal disease) on dialysis ([facility])    "TTS; [place]" (03/24/2015)  . Glomerulonephritis   . Heart murmur   . HTN (hypertension)   . Hypothyroidism   . Kidney transplant recipient 01/21/2017  . Pancreatitis   . Renal insufficiency   . Secondary hyperparathyroidism ([facility])    [name] 05/11/2014    Past Surgical History:  Procedure Laterality Date  . ANTERIOR CERVICAL CORPECTOMY N/A 05/24/2017   Procedure: CORPECTOMY CERVICAL FIVE- CERVICAL SIX;  Surgeon: Ashok Pall, MD;  Location: [facility];  Service: Neurosurgery;  Laterality: N/A;  . ARTERIOVENOUS GRAFT PLACEMENT Right 1990's?  . ARTERIOVENOUS GRAFT PLACEMENT Left 07/2002   upper arm/notes 06/02/2010  . ARTERIOVENOUS GRAFT PLACEMENT Right 07/2003   upper arm/notes 06/02/2010  . AV FISTULA PLACEMENT Left 09/2000   upper arm/notes 06/02/2010  . BREAST BIOPSY Left unsure   benign  . COLONOSCOPY    . DILATATION & CURRETTAGE/HYSTEROSCOPY WITH RESECTOCOPE N/A 04/17/2013   Procedure: DILATATION & CURETTAGE/HYSTEROSCOPY WITH RESECTOCOPE;  Surgeon: Marvene Staff, MD;  Location: [facility];  Service: Gynecology;  Laterality: N/A;  . DILATION AND CURETTAGE OF UTERUS    . EYE SURGERY    . KIDNEY TRANSPLANT  1997  . KIDNEY TRANSPLANT  01/21/2017   Dr. Harrington Challenger  . PARATHYROIDECTOMY  03/2000 05/12/2014   w/neck exploration & autotransplantation/notes 06/02/2010; w/neck exploration  . PARATHYROIDECTOMY N/A 05/12/2014   Procedure: PARATHYROIDECTOMY AND NECK EXPLORATION;  Surgeon: Armandina Gemma, MD;  Location: [facility];  Service: General;  Laterality: N/A;  . PERITONEAL CATHETER INSERTION    . PERITONEAL CATHETER REMOVAL  08/1999   [name] 06/02/2010  . POSTERIOR CERVICAL FUSION/FORAMINOTOMY N/A 05/30/2017   Procedure: POSTERIOR CERVICAL Arthrodesis Cervical three - four, cervical four - five, cervical five - six, cervical six - seven;  Surgeon: Ashok Pall, MD;  Location: [facility];  Service: Neurosurgery;  Laterality: N/A;  . POSTERIOR CERVICAL LAMINECTOMY N/A 08/12/2016   Procedure: POSTERIOR CERVICAL LAMINECTOMY CERVICAL THREE CERVICAL FOUR, CERVICAL FOUR CERVICAL FIVE, CERVICAL FIVE- CERVICAL SIX, CERVICAL SIX- CERVICAL SEVEN;  Surgeon: Ashok Pall, MD;  Location: [facility];  Service: Neurosurgery;  Laterality: N/A;  POSTERIOR  . RETINAL DETACHMENT SURGERY Left   . THROMBECTOMY Left 02/2002   fistula/notes 06/02/2010  . THROMBECTOMY / ARTERIOVENOUS GRAFT REVISION  11/2003; 01/2004; 08/07/2005; 08/10/2005;  10/2005   [name] 06/02/2010  . THROMBECTOMY AND REVISION OF ARTERIOVENTOUS (AV) GORETEX  GRAFT Right 01/2002; 11/2003; 01/2004; 08/07/2004; 08/10/2004; 11/13/2005   [name] 5/1/2012Marland Kitchen [name] 5/16/2012Marland Kitchen [name] 5/16/2012Marland Kitchen [name] 5/16/2012Marland Kitchen [name] 06/02/2010; [name] 06/02/2010  . THROMBECTOMY AND REVISION OF ARTERIOVENTOUS (AV) GORETEX  GRAFT Left 08/23/2002; 09/13/2002; 07/08/2003   [name] 06/02/2010; [name] 06/02/2010; [name] 06/02/2010    There were no vitals filed for this visit.  Subjective  Assessment - 07/13/17 1018    Subjective   doing better    Pertinent History  s/p C5/6/7 anterior corpectomy (with strut graft and anterior plating) and posterior C3-T1 arthrodesis on 05/24/17.  Pt with PMH that includes:  Arthritis, ESRD, s/p kidney transplant 01/21/17, HTN, Hypotyroidism, heart murmur, anxiety, history of C3-C6 posterior decompression 2018    Limitations  Pt reports that she called MD office regarding restrictions and reports that she may begin low-range strengthening, ok to perform overhead reaching, no lifting lb limit, cervical brace for comfort    Patient Stated Goals  improve coordination/strength UEs, return to work    Currently in Pain?  No/denies    Pain Onset  --         Typing test:  22 Wpm, 95% accuracy, with 21 wpm net speed.  Practiced typing numbers in word document with only 1 error and incr time.  Then typing game "Wordtris" with numbers with score of 4880 and with words with score of 3266.  Placing grooved pegs in pegboard with each hand with min difficulty/incr time, min v.c. For shoulder compensation.   Functional reaching to place/remove clothespins with 1-8lb resistance on vertical pole with each hand with difficulty for functional reach and incr pinch strength for ADLs.   Red putty exercises for tip and lateral pinch with each hand.                OT Short Term Goals - 07/11/17 1217      OT SHORT TERM GOAL #1   Title  Pt will be independent with initial HEP.--check STGs 07/22/17    Time  4    Period  Weeks    Status  New      OT SHORT TERM GOAL #2   Title  Pt will improve coordination for ADLs as shown by completing 9-hole peg test in less than 22sec with R hand and 24sec with L hand.     Baseline  R-24.44, L-29.47sec    Time  4    Period  Weeks    Status  New      OT SHORT TERM GOAL #3   Title  Pt will be able to perform overhead reaching to retrieve light object without compensation.    Baseline  75% with compensation    Time  4     Period  Weeks    Status  New      OT SHORT TERM GOAL #4   Title  Pt will improve bilateral grip strength by at least 5lbs to assist with ADLs (opening containers, gripping).    Baseline  R-35lbs, L-32lbs    Time  4    Period  Weeks    Status  New      OT SHORT TERM GOAL #5   Title  Pt will improve lateral pinch strength by at least 4lbs bilaterally for work tasks/ADLs.    Baseline  R-7lbs, L-6lbs    Time  4    Period  Weeks    Status  New  OT Long Term Goals - 06/22/17 1507      OT LONG TERM GOAL #1   Title  Pt will be independent with updated HEP.--check LTGs 08/22/17    Time  12    Period  Weeks    Status  New      OT LONG TERM GOAL #2   Title  Pt will be able to retrieve at least 2lb object from overhead shelf with each UE safely.    Time  12    Period  Weeks    Status  New      OT LONG TERM GOAL #3   Title  Pt will perform simulated fine motor work tasks (typing, writing, preparing slides, etc) with min difficulty/sufficient for return to work.    Time  12    Period  Weeks    Status  New      OT LONG TERM GOAL #4   Title  Pt will improve 3point pinch strength by at least 4lbs bilaterally for work tasks/ADLs.    Baseline  R-7bs, L-6lbs    Time  12    Period  Weeks    Status  New      OT LONG TERM GOAL #5   Title  Pt will improve bilateral grip strength by at least 10lbs to assist with ADLs (opening containers, gripping).    Baseline  R-35lbs, L-32lbs    Time  12    Period  Weeks    Status  New      OT LONG TERM GOAL #6   Title  Pt will be able to perform mod complex IADLs (vacuuming, carrying heavier groceries, etc).  mod I.    Time  12    Period  Weeks    Status  New      OT LONG TERM GOAL #7   Title  --    Baseline  --    Time  --    Period  --    Status  --            Plan - 07/13/17 1019    Clinical Impression Statement  Pt is progressing with coordination.  However, pt continues to demo decr hand strength for functional tasks  (using nail clippers).    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      Occupational performance deficits (Please refer to evaluation for details):  ADL's;IADL's;Work;Leisure    Rehab Potential  Good    OT Frequency  2x / week    OT Duration  12 weeks +eval    OT Treatment/Interventions  Self-care/ADL training;Therapeutic exercise;Moist Heat;Electrical Stimulation;Fluidtherapy;Paraffin;Neuromuscular education;Splinting;Patient/family education;Energy conservation;Therapist, nutritional;Therapeutic activities;Cryotherapy;Ultrasound;DME and/or AE instruction;Manual Therapy;Passive range of motion;Balance training    Plan  coordination, gentle strengthening; begin checking STGs next week    Clinical Decision Making  Several treatment options, min-mod task modification necessary    OT Home Exercise Plan  Education provided:  Red putty HEP, Coordination HEP, shoulder HEP    Consulted and Agree with Plan of Care  Patient       Patient will benefit from skilled therapeutic intervention in order to improve the following deficits and impairments:  Decreased knowledge of use of DME, Pain, Decreased coordination, Decreased mobility, Impaired sensation, Decreased strength, Decreased range of motion, Decreased endurance, Decreased  activity tolerance, Decreased balance, Decreased knowledge of precautions, Impaired perceived functional ability, Impaired UE functional use  Visit Diagnosis: Muscle weakness (generalized)  Other abnormalities of gait and mobility  Other lack of coordination  Unsteadiness on feet    Problem List Patient Active Problem List   Diagnosis Date Noted  . 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 S/P spinal surgery 05/24/2017  . Other secondary kyphosis, cervical region  05/24/2017  . Anxiety 11/21/2016  . Stenosis of cervical spine with myelopathy ([facility]) 08/12/2016  . Routine general medical examination at a health care facility 06/01/2016  . Numbness and tingling in both hands 05/18/2015  . Hypothyroidism 03/24/2015  . History of tachycardia 03/24/2015  . HTN (hypertension), benign 06/12/2013  . ESRD on dialysis [facility]) 06/12/2013    [facility] 07/13/2017, 12:21 PM  [facility] [address] [place], [place], [address] Phone: [phone]   Fax:  [phone]  Name: [name] MRN: [id number] Date of Birth: Jun 20, 1960   Vianne Bulls, OTR/L [facility] [address] [place], [place]  [address] [phone] phone [phone] 07/13/17 12:21 PM

## 2017-07-13 NOTE — Therapy (Signed)
[facility] [address] [place], [place], [address] Phone: [phone]   Fax:  [phone]  Physical Therapy Treatment  Patient Details  Name: [name] MRN: [id number] Date of Birth: 1960/12/10 Referring Provider: Dr. Ashok Pall   Encounter Date: 07/13/2017  PT End of Session - 07/13/17 1151    Visit Number  7    Number of Visits  17    Date for PT Re-Evaluation  08/21/17    Authorization Type  Medicare    PT Start Time  0930    PT Stop Time  1015    PT Time Calculation (min)  45 min    Equipment Utilized During Treatment  Gait belt    Activity Tolerance  Patient tolerated treatment well    Behavior During Therapy  [facility] for tasks assessed/performed       Past Medical History:  Diagnosis Date  . Anemia   . Anxiety   . Arthritis   . Claustrophobia   . ESRD (end stage renal disease) on dialysis ([facility])    "TTS; [place]" (03/24/2015)  . Glomerulonephritis   . Heart murmur   . HTN (hypertension)   . Hypothyroidism   . Kidney transplant recipient 01/21/2017  . Pancreatitis   . Renal insufficiency   . Secondary hyperparathyroidism ([facility])    [name] 05/11/2014    Past Surgical History:  Procedure Laterality Date  . ANTERIOR CERVICAL CORPECTOMY N/A 05/24/2017   Procedure: CORPECTOMY CERVICAL FIVE- CERVICAL SIX;  Surgeon: Ashok Pall, MD;  Location: [facility];  Service: Neurosurgery;  Laterality: N/A;  . ARTERIOVENOUS GRAFT PLACEMENT Right 1990's?  . ARTERIOVENOUS GRAFT PLACEMENT Left 07/2002   upper arm/notes 06/02/2010  . ARTERIOVENOUS GRAFT PLACEMENT Right 07/2003   upper arm/notes 06/02/2010  . AV FISTULA PLACEMENT Left 09/2000   upper arm/notes 06/02/2010  . BREAST BIOPSY Left unsure   benign  . COLONOSCOPY    . DILATATION & CURRETTAGE/HYSTEROSCOPY WITH RESECTOCOPE N/A 04/17/2013   Procedure: DILATATION & CURETTAGE/HYSTEROSCOPY WITH RESECTOCOPE;  Surgeon: Marvene Staff, MD;  Location: [facility];  Service: Gynecology;  Laterality: N/A;  . DILATION AND CURETTAGE OF UTERUS    . EYE SURGERY    . KIDNEY TRANSPLANT  1997  . KIDNEY TRANSPLANT  01/21/2017   Dr. Harrington Challenger  . PARATHYROIDECTOMY  03/2000 05/12/2014   w/neck exploration & autotransplantation/notes 06/02/2010; w/neck exploration  . PARATHYROIDECTOMY N/A 05/12/2014   Procedure: PARATHYROIDECTOMY AND NECK EXPLORATION;  Surgeon: Armandina Gemma, MD;  Location: [facility];  Service: General;  Laterality: N/A;  . PERITONEAL CATHETER INSERTION    . PERITONEAL CATHETER REMOVAL  08/1999   [name] 06/02/2010  . POSTERIOR CERVICAL FUSION/FORAMINOTOMY N/A 05/30/2017   Procedure: POSTERIOR CERVICAL Arthrodesis Cervical three - four, cervical four - five, cervical five - six, cervical six - seven;  Surgeon: Ashok Pall, MD;  Location: [facility];  Service: Neurosurgery;  Laterality: N/A;  . POSTERIOR CERVICAL LAMINECTOMY N/A 08/12/2016   Procedure: POSTERIOR CERVICAL LAMINECTOMY CERVICAL THREE CERVICAL FOUR, CERVICAL FOUR CERVICAL FIVE, CERVICAL FIVE- CERVICAL SIX, CERVICAL SIX- CERVICAL SEVEN;  Surgeon: Ashok Pall, MD;  Location: [facility];  Service: Neurosurgery;  Laterality: N/A;  POSTERIOR  . RETINAL DETACHMENT SURGERY Left   . THROMBECTOMY Left 02/2002   fistula/notes 06/02/2010  . THROMBECTOMY / ARTERIOVENOUS GRAFT REVISION  11/2003; 01/2004; 08/07/2005; 08/10/2005; 10/2005   [name] 06/02/2010  . THROMBECTOMY AND REVISION OF ARTERIOVENTOUS (AV) GORETEX  GRAFT Right 01/2002; 11/2003; 01/2004; 08/07/2004; 08/10/2004; 11/13/2005   /  notes 05/18/2010; [name] 5/16/2012Marland Kitchen [name] 5/16/2012Marland Kitchen [name] 5/16/2012Marland Kitchen [name] 5/16/2012Marland Kitchen [name] 06/02/2010  . THROMBECTOMY AND REVISION OF ARTERIOVENTOUS (AV) GORETEX  GRAFT Left 08/23/2002; 09/13/2002; 07/08/2003   [name] 06/02/2010; [name] 06/02/2010; [name] 06/02/2010    There were no vitals filed for this visit.  Subjective Assessment - 07/13/17 0951    Subjective  No new complaints. No falls.  Still has tightness in neck.    Pertinent  History  s/p kidney transplant 01/21/17    Limitations  House hold activities;Walking    How long can you walk comfortably?  Is able to ambulate approx 5-15 mins before she notices R foot dragging.     Patient Stated Goals  "I want to be able to walk without feeling like I"m getting ready to fall."    Currently in Pain?  No/denies    Pain Score  0-No pain    Pain Onset  More than a month ago          [facility] Adult PT Treatment/Exercise - 07/13/17 1026      Ambulation/Gait   Ambulation/Gait  Yes    Ambulation/Gait Assistance  4: Min guard;4: Min assist    Assistive device  None    Ambulation Surface  Level;Indoor    Gait Comments  in hallway pt ambulated while catching a ball and naming animals A-Z. Pt had slow gt speed while performing dynamic gt.      Neuro Re-ed    Neuro Re-ed Details   in corner on airex pad with chair in front for saftey; narrow base of support with EC head movements left<>right, then up<>down with min guard asisit for balance. Progressing to semi tandem with head movements side<>side, up<>down min assist for balance and occasional UE support.  cervical motions performed within pain free ranges only.      Exercises   Other Exercises   in hooklying position: cervical retraction for 5 sec holds x 10 reps, scapular retraction with 5 sec holds x 10 reps, each in pain free ranges. cues on ex form needed with all ex's performed.       Neck Exercises: Seated   Cervical Rotation  Both;10 reps;Other (comment)    Other Seated Exercise  Cervical flexion/extension x 10 reps with 2-3 sec holds.       Neck Exercises: Supine   Neck Retraction  10 reps;5 secs      Manual Therapy   Manual Therapy  Soft tissue mobilization;Myofascial release;Neural Stretch;Manual Traction    Manual therapy comments  all maunal therapy performed for decreased tightness of cervical and thoracic area.     Soft tissue mobilization  to bil upper traps, scalenes, levators and cervical paraspinals.              Myofascial Release  to bil cervical paraspinals, upper traps, rhomboids and scalenes.      Manual Traction  suboccipital release for 1 minute holds x 2 reps; gentle cervical distraction for increased muslce extensibility     Neural Stretch  concurrent with gentle manual distraction of cervical spine- passive overpressure to shoulder in inferior direction for increased passive stretching for 1 minute hold x 2 on each side      Neck Exercises: Stretches   Upper Trapezius Stretch  3 reps;30 seconds;Left;Right    Levator Stretch  30 seconds;Right;Left;3 reps           PT Short Term Goals - 06/22/17 1228      PT SHORT TERM GOAL #1   Title  Pt will be independent with initial HEP in order to indicate improved functional mobility and decreased fall risk.  (Target Date: 07/22/17)    Time  4    Period  Weeks    Status  New    Target Date  07/22/17      PT SHORT TERM GOAL #2   Title  Pt will improve 5TSS to </=16.50 secs without UE support in order to indicate decreased fall risk and improved functional strength.      Time  4    Period  Weeks    Status  New      PT SHORT TERM GOAL #3   Title  Pt will improve FGA to 19/30 in order to indicate decreased fall risk.      Time  4    Period  Weeks    Status  New      PT SHORT TERM GOAL #4   Title  Pt will ambulate at gait speed of >/=2.62 ft/sec with decreased indication of R foot slap/overt gait deviations indicating safe gait speed.     Time  4    Period  Weeks    Status  New      PT SHORT TERM GOAL #5   Title  Pt will ambulate outdoors over unlevel paved surfaces up to 1000' at mod I level in order to indicate improved community independence.     Time  4    Period  Weeks    Status  New        PT Long Term Goals - 06/22/17 1232      PT LONG TERM GOAL #1   Title  Pt will be independent with final HEP in order to indicate improved functional mobility and decreased fall risk.  (Target Date: 08/21/17)    Time  8    Period   Weeks    Status  New    Target Date  08/21/17      PT LONG TERM GOAL #2   Title  Pt will improve 5TSS to </=13.50 secs without UE support in order to indicate improved functional strength and decreased fall risk.     Time  8    Period  Weeks    Status  New      PT LONG TERM GOAL #3   Title  Pt will improve FGA to >/=23/30 in order to indicate decreased fall risk.      Time  8    Period  Weeks    Status  New      PT LONG TERM GOAL #4   Title  Pt will ambulate >1000' over varying outdoor surfaces, scanning environment at independent level in order to indicate safe community and leisure mobility.     Time  8    Period  Weeks    Status  New         Plan - 07/13/17 1152    Clinical Impression Statement  Today's skilled session contiuned to address carvical tightness. The remainder of session continued to address balance with no issues reported. Pt is progressing with balance on compliant surfaces with vision removed. Pt is progressing toward goals and should benefit from continued PT to progress toward unmet goals.     Rehab Potential  Good    Clinical Impairments Affecting Rehab Potential  severity of cord compression     PT Frequency  2x / week    PT Duration  8 weeks    PT  Treatment/Interventions  ADLs/Self Care Home Management;Electrical Stimulation;Gait training;Stair training;Functional mobility training;Therapeutic activities;Therapeutic exercise;Balance training;Neuromuscular re-education;Patient/family education;Orthotic Fit/Training;Passive range of motion;Manual techniques    PT Next Visit Plan  Continue to address cervical tightness, all therapy without cervical collar, gentle neck ROM, continue to address balance on compliant surfaces and dynamic gait.    Consulted and Agree with Plan of Care  Patient       Patient will benefit from skilled therapeutic intervention in order to improve the following deficits and impairments:  Abnormal gait, Decreased activity tolerance,  Decreased balance, Decreased endurance, Decreased mobility, Decreased range of motion, Decreased strength, Hypomobility, Impaired perceived functional ability, Impaired flexibility, Postural dysfunction, Improper body mechanics, Impaired sensation  Visit Diagnosis: Muscle weakness (generalized)  Other abnormalities of gait and mobility  Unsteadiness on feet     Problem List Patient Active Problem List   Diagnosis Date Noted  . 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 S/P spinal surgery 05/24/2017  . Other secondary kyphosis, cervical region 05/24/2017  . Anxiety 11/21/2016  . Stenosis of cervical spine with myelopathy ([place]) 08/12/2016  . Routine general medical examination at a health care facility 06/01/2016  . Numbness and tingling in both hands 05/18/2015  . Hypothyroidism 03/24/2015  . History of tachycardia 03/24/2015  . HTN (hypertension), benign 06/12/2013  . ESRD on dialysis [facility]) 06/12/2013    Halina Andreas, [place] 07/13/2017, 12:08 PM  [facility] [address] [place], [place], [address] Phone: [phone]   Fax:  [phone]  Name: OUITA NISH MRN: [id number] Date of Birth: February 27, 1960

## 2017-07-14 ENCOUNTER — Encounter: Payer: Self-pay | Admitting: Internal Medicine

## 2017-07-14 ENCOUNTER — Ambulatory Visit (INDEPENDENT_AMBULATORY_CARE_PROVIDER_SITE_OTHER): Payer: Medicare Other | Admitting: Internal Medicine

## 2017-07-14 DIAGNOSIS — R000 Tachycardia, unspecified: Secondary | ICD-10-CM | POA: Diagnosis not present

## 2017-07-14 DIAGNOSIS — T8619 Other complication of kidney transplant: Secondary | ICD-10-CM | POA: Diagnosis not present

## 2017-07-14 DIAGNOSIS — Z7952 Long term (current) use of systemic steroids: Secondary | ICD-10-CM | POA: Diagnosis not present

## 2017-07-14 DIAGNOSIS — F419 Anxiety disorder, unspecified: Secondary | ICD-10-CM

## 2017-07-14 DIAGNOSIS — E876 Hypokalemia: Secondary | ICD-10-CM | POA: Diagnosis not present

## 2017-07-14 DIAGNOSIS — R609 Edema, unspecified: Secondary | ICD-10-CM | POA: Diagnosis not present

## 2017-07-14 DIAGNOSIS — Z940 Kidney transplant status: Secondary | ICD-10-CM | POA: Diagnosis not present

## 2017-07-14 DIAGNOSIS — I120 Hypertensive chronic kidney disease with stage 5 chronic kidney disease or end stage renal disease: Secondary | ICD-10-CM | POA: Diagnosis not present

## 2017-07-14 DIAGNOSIS — Z4822 Encounter for aftercare following kidney transplant: Secondary | ICD-10-CM | POA: Diagnosis not present

## 2017-07-14 DIAGNOSIS — Z87891 Personal history of nicotine dependence: Secondary | ICD-10-CM | POA: Diagnosis not present

## 2017-07-14 DIAGNOSIS — Z79899 Other long term (current) drug therapy: Secondary | ICD-10-CM | POA: Diagnosis not present

## 2017-07-14 DIAGNOSIS — E872 Acidosis: Secondary | ICD-10-CM | POA: Diagnosis not present

## 2017-07-14 DIAGNOSIS — D631 Anemia in chronic kidney disease: Secondary | ICD-10-CM | POA: Diagnosis not present

## 2017-07-14 DIAGNOSIS — I10 Essential (primary) hypertension: Secondary | ICD-10-CM | POA: Diagnosis not present

## 2017-07-14 DIAGNOSIS — D8989 Other specified disorders involving the immune mechanism, not elsewhere classified: Secondary | ICD-10-CM | POA: Diagnosis not present

## 2017-07-14 DIAGNOSIS — N186 End stage renal disease: Secondary | ICD-10-CM | POA: Diagnosis not present

## 2017-07-14 DIAGNOSIS — J449 Chronic obstructive pulmonary disease, unspecified: Secondary | ICD-10-CM | POA: Diagnosis not present

## 2017-07-14 DIAGNOSIS — D649 Anemia, unspecified: Secondary | ICD-10-CM | POA: Diagnosis not present

## 2017-07-14 MED ORDER — ALPRAZOLAM 0.25 MG PO TABS: 0.2500 mg | ORAL_TABLET | Freq: Every day | ORAL | 0 refills | Status: DC | PRN

## 2017-07-14 NOTE — Patient Instructions (Signed)
We have sent in xanax to use if needed for anxiety. Do not drive after taking it until you know how it will affect you.   Stress and Stress Management Stress is a normal reaction to life events. It is what you feel when life demands more than you are used to or more than you can handle. Some stress can be useful. For example, the stress reaction can help you catch the last bus of the day, study for a test, or meet a deadline at work. But stress that occurs too often or for too long can cause problems. It can affect your emotional health and interfere with relationships and normal daily activities. Too much stress can weaken your immune system and increase your risk for physical illness. If you already have a medical problem, stress can make it worse. What are the causes? All sorts of life events may cause stress. An event that causes stress for one person may not be stressful for another person. Major life events commonly cause stress. These may be positive or negative. Examples include losing your job, moving into a new home, getting married, having a baby, or losing a loved one. Less obvious life events may also cause stress, especially if they occur day after day or in combination. Examples include working long hours, driving in traffic, caring for children, being in debt, or being in a difficult relationship. What are the signs or symptoms? Stress may cause emotional symptoms including, the following:  Anxiety. This is feeling worried, afraid, on edge, overwhelmed, or out of control.  Anger. This is feeling irritated or impatient.  Depression. This is feeling sad, down, helpless, or guilty.  Difficulty focusing, remembering, or making decisions.  Stress may cause physical symptoms, including the following:  Aches and pains. These may affect your head, neck, back, stomach, or other areas of your body.  Tight muscles or clenched jaw.  Low energy or trouble sleeping.  Stress may cause  unhealthy behaviors, including the following:  Eating to feel better (overeating) or skipping meals.  Sleeping too little, too much, or both.  Working too much or putting off tasks (procrastination).  Smoking, drinking alcohol, or using drugs to feel better.  How is this diagnosed? Stress is diagnosed through an assessment by your health care provider. Your health care provider will ask questions about your symptoms and any stressful life events.Your health care provider will also ask about your medical history and may order blood tests or other tests. Certain medical conditions and medicine can cause physical symptoms similar to stress. Mental illness can cause emotional symptoms and unhealthy behaviors similar to stress. Your health care provider may refer you to a mental health professional for further evaluation. How is this treated? Stress management is the recommended treatment for stress.The goals of stress management are reducing stressful life events and coping with stress in healthy ways. Techniques for reducing stressful life events include the following:  Stress identification. Self-monitor for stress and identify what causes stress for you. These skills may help you to avoid some stressful events.  Time management. Set your priorities, keep a calendar of events, and learn to say "no." These tools can help you avoid making too many commitments.  Techniques for coping with stress include the following:  Rethinking the problem. Try to think realistically about stressful events rather than ignoring them or overreacting. Try to find the positives in a stressful situation rather than focusing on the negatives.  Exercise. Physical exercise can release both  physical and emotional tension. The key is to find a form of exercise you enjoy and do it regularly.  Relaxation techniques. These relax the body and mind. Examples include yoga, meditation, tai chi, biofeedback, deep breathing,  progressive muscle relaxation, listening to music, being out in nature, journaling, and other hobbies. Again, the key is to find one or more that you enjoy and can do regularly.  Healthy lifestyle. Eat a balanced diet, get plenty of sleep, and do not smoke. Avoid using alcohol or drugs to relax.  Strong support network. Spend time with family, friends, or other people you enjoy being around.Express your feelings and talk things over with someone you trust.  Counseling or talktherapy with a mental health professional may be helpful if you are having difficulty managing stress on your own. Medicine is typically not recommended for the treatment of stress.Talk to your health care provider if you think you need medicine for symptoms of stress. Follow these instructions at home:  Keep all follow-up visits as directed by your health care provider.  Take all medicines as directed by your health care provider. Contact a health care provider if:  Your symptoms get worse or you start having new symptoms.  You feel overwhelmed by your problems and can no longer manage them on your own. Get help right away if:  You feel like hurting yourself or someone else. This information is not intended to replace advice given to you by your health care provider. Make sure you discuss any questions you have with your health care provider. Document Released: 06/29/2000 Document Revised: 06/11/2015 Document Reviewed: 08/28/2012 Elsevier Interactive Patient Education  2017 [employer].

## 2017-07-14 NOTE — Progress Notes (Signed)
   Subjective:    Patient ID: [name], female    DOB: [birth year], 57 y.o.   MRN: [id number]  HPI The patient is a 57 YO female coming in for anxiety related to recent health changes and surgeries. She has recently had kidney transplant and then neck surgery. She is still doing PT multiple times per week for the neck pain. She was especially traumatized with the kidney transplant due to having kidney fail in the past and there being some complications. She denies SI/HI. Denies depression but is having some panic episodes. Denies chest pains or SOB. She is sometimes able to calm herself down and other times she just has to stay home and wait for it to pass.   Review of Systems  Constitutional: Negative.   Respiratory: Negative for cough, chest tightness and shortness of breath.   Cardiovascular: Negative for chest pain, palpitations and leg swelling.  Gastrointestinal: Negative for abdominal distention, abdominal pain, constipation, diarrhea, nausea and vomiting.  Musculoskeletal: Negative.   Skin: Negative.   Neurological: Negative.   Psychiatric/Behavioral: Positive for sleep disturbance. Negative for behavioral problems, confusion, decreased concentration, dysphoric mood, self-injury and suicidal ideas. The patient is nervous/anxious.       Objective:   Physical Exam  Constitutional: She is oriented to person, place, and time. She appears well-developed and well-nourished.  HENT:  Head: Normocephalic and atraumatic.  Eyes: EOM are normal.  Neck: Normal range of motion.  Cardiovascular: Normal rate and regular rhythm.  Pulmonary/Chest: Effort normal and breath sounds normal. No respiratory distress. She has no wheezes. She has no rales.  Abdominal: Soft. She exhibits no distension. There is no tenderness. There is no rebound.  Musculoskeletal: She exhibits no edema.  Neurological: She is alert and oriented to person, place, and time. Coordination normal.  Skin: Skin is warm and dry.    Psychiatric: She has a normal mood and affect.   Vitals:   07/14/17 1532  BP: 118/62  Pulse: 86  Temp: 98.4 F (36.9 C)  TempSrc: Oral  SpO2: 99%  Weight: 150 lb (68 kg)  Height: 5\' 7"  (1.702 m)      Assessment & Plan:

## 2017-07-14 NOTE — Assessment & Plan Note (Signed)
Dealing with adjustment disorder from recent health changes. Advised counseling, relaxation techniques. Rx for xanax #20 no refills to be taken for panic episodes only. We talked about risk of abuse or dependence. She is no longer on opioids for her neck (taking only tylenol).

## 2017-07-17 ENCOUNTER — Ambulatory Visit: Payer: Medicare Other | Admitting: Occupational Therapy

## 2017-07-17 ENCOUNTER — Encounter: Payer: Medicare Other | Admitting: Occupational Therapy

## 2017-07-17 ENCOUNTER — Ambulatory Visit: Payer: Medicare Other | Attending: Internal Medicine | Admitting: Rehabilitation

## 2017-07-17 ENCOUNTER — Encounter: Payer: Self-pay | Admitting: Rehabilitation

## 2017-07-17 DIAGNOSIS — M6281 Muscle weakness (generalized): Secondary | ICD-10-CM | POA: Diagnosis not present

## 2017-07-17 DIAGNOSIS — R2689 Other abnormalities of gait and mobility: Secondary | ICD-10-CM | POA: Insufficient documentation

## 2017-07-17 DIAGNOSIS — R2681 Unsteadiness on feet: Secondary | ICD-10-CM | POA: Insufficient documentation

## 2017-07-17 DIAGNOSIS — M542 Cervicalgia: Secondary | ICD-10-CM

## 2017-07-17 DIAGNOSIS — R278 Other lack of coordination: Secondary | ICD-10-CM | POA: Insufficient documentation

## 2017-07-17 NOTE — Therapy (Signed)
[facility] [address] [place], [place], [address] Phone: [phone]   Fax:  [phone]  Physical Therapy Treatment  Patient Details  Name: [name] MRN: [id number] Date of Birth: [birth year] Referring Provider: Dr. Ashok Pall   Encounter Date: 07/17/2017  PT End of Session - 07/17/17 1622    Visit Number  8    Number of Visits  17    Date for PT Re-Evaluation  08/21/17    Authorization Type  Medicare    PT Start Time  1619    PT Stop Time  1700    PT Time Calculation (min)  41 min    Equipment Utilized During Treatment  Gait belt    Activity Tolerance  Patient tolerated treatment well    Behavior During Therapy  [facility] for tasks assessed/performed       Past Medical History:  Diagnosis Date  . Anemia   . Anxiety   . Arthritis   . Claustrophobia   . ESRD (end stage renal disease) on dialysis ([place])    "TTS; [place]" (03/24/2015)  . Glomerulonephritis   . Heart murmur   . HTN (hypertension)   . Hypothyroidism   . Kidney transplant recipient 01/21/2017  . Pancreatitis   . Renal insufficiency   . Secondary hyperparathyroidism ([place])    [name] 05/11/2014    Past Surgical History:  Procedure Laterality Date  . ANTERIOR CERVICAL CORPECTOMY N/A 05/24/2017   Procedure: CORPECTOMY CERVICAL FIVE- CERVICAL SIX;  Surgeon: Ashok Pall, MD;  Location: [place];  Service: Neurosurgery;  Laterality: N/A;  . ARTERIOVENOUS GRAFT PLACEMENT Right 1990's?  . ARTERIOVENOUS GRAFT PLACEMENT Left 07/2002   upper arm/notes 06/02/2010  . ARTERIOVENOUS GRAFT PLACEMENT Right 07/2003   upper arm/notes 06/02/2010  . AV FISTULA PLACEMENT Left 09/2000   upper arm/notes 06/02/2010  . BREAST BIOPSY Left unsure   benign  . COLONOSCOPY    . DILATATION & CURRETTAGE/HYSTEROSCOPY WITH RESECTOCOPE N/A 04/17/2013   Procedure: DILATATION & CURETTAGE/HYSTEROSCOPY WITH RESECTOCOPE;  Surgeon: Marvene Staff, MD;  Location: [facility];  Service: Gynecology;  Laterality: N/A;  . DILATION AND CURETTAGE OF UTERUS    . EYE SURGERY    . KIDNEY TRANSPLANT  1997  . KIDNEY TRANSPLANT  01/21/2017   Dr. Harrington Challenger  . PARATHYROIDECTOMY  03/2000 05/12/2014   w/neck exploration & autotransplantation/notes 06/02/2010; w/neck exploration  . PARATHYROIDECTOMY N/A 05/12/2014   Procedure: PARATHYROIDECTOMY AND NECK EXPLORATION;  Surgeon: Armandina Gemma, MD;  Location: [facility];  Service: General;  Laterality: N/A;  . PERITONEAL CATHETER INSERTION    . PERITONEAL CATHETER REMOVAL  08/1999   [name] 06/02/2010  . POSTERIOR CERVICAL FUSION/FORAMINOTOMY N/A 05/30/2017   Procedure: POSTERIOR CERVICAL Arthrodesis Cervical three - four, cervical four - five, cervical five - six, cervical six - seven;  Surgeon: Ashok Pall, MD;  Location: [facility];  Service: Neurosurgery;  Laterality: N/A;  . POSTERIOR CERVICAL LAMINECTOMY N/A 08/12/2016   Procedure: POSTERIOR CERVICAL LAMINECTOMY CERVICAL THREE CERVICAL FOUR, CERVICAL FOUR CERVICAL FIVE, CERVICAL FIVE- CERVICAL SIX, CERVICAL SIX- CERVICAL SEVEN;  Surgeon: Ashok Pall, MD;  Location: [facility];  Service: Neurosurgery;  Laterality: N/A;  POSTERIOR  . RETINAL DETACHMENT SURGERY Left   . THROMBECTOMY Left 02/2002   fistula/notes 06/02/2010  . THROMBECTOMY / ARTERIOVENOUS GRAFT REVISION  11/2003; 01/2004; 08/07/2005; 08/10/2005; 10/2005   [name] 06/02/2010  . THROMBECTOMY AND REVISION OF ARTERIOVENTOUS (AV) GORETEX  GRAFT Right 01/2002; 11/2003; 01/2004; 08/07/2004; 08/10/2004; 11/13/2005   /  notes 05/18/2010; [name] 5/16/2012Marland Kitchen [name] 5/16/2012Marland Kitchen [name] 5/16/2012Marland Kitchen [name] 5/16/2012Marland Kitchen [name] 06/02/2010  . THROMBECTOMY AND REVISION OF ARTERIOVENTOUS (AV) GORETEX  GRAFT Left 08/23/2002; 09/13/2002; 07/08/2003   [name] 06/02/2010; [name] 06/02/2010; [name] 06/02/2010    There were no vitals filed for this visit.  Subjective Assessment - 07/17/17 1620    Subjective  Pt reports doing better since being home from hospital last week.      Pertinent History  s/p kidney transplant 01/21/17    Limitations  House hold activities;Walking    How long can you walk comfortably?  Is able to ambulate approx 5-15 mins before she notices R foot dragging.     Patient Stated Goals  "I want to be able to walk without feeling like I"m getting ready to fall."    Currently in Pain?  No/denies         [facility] PT Assessment - 07/17/17 1634      Functional Gait  Assessment   Gait assessed   Yes    Gait Level Surface  Walks 20 ft in less than 7 sec but greater than 5.5 sec, uses assistive device, slower speed, mild gait deviations, or deviates 6-10 in outside of the 12 in walkway width.    Change in Gait Speed  Able to smoothly change walking speed without loss of balance or gait deviation. Deviate no more than 6 in outside of the 12 in walkway width.    Gait with Horizontal Head Turns  Performs head turns smoothly with no change in gait. Deviates no more than 6 in outside 12 in walkway width    Gait with Vertical Head Turns  Performs head turns with no change in gait. Deviates no more than 6 in outside 12 in walkway width.    Gait and Pivot Turn  Pivot turns safely within 3 sec and stops quickly with no loss of balance.    Step Over Obstacle  Is able to step over 2 stacked shoe boxes taped together (9 in total height) without changing gait speed. No evidence of imbalance.    Gait with Narrow Base of Support  Ambulates less than 4 steps heel to toe or cannot perform without assistance.    Gait with Eyes Closed  Walks 20 ft, uses assistive device, slower speed, mild gait deviations, deviates 6-10 in outside 12 in walkway width. Ambulates 20 ft in less than 9 sec but greater than 7 sec.    Ambulating Backwards  Walks 20 ft, slow speed, abnormal gait pattern, evidence for imbalance, deviates 10-15 in outside 12 in walkway width.    Steps  Alternating feet, no rail.    Total Score  23                   [facility] Adult PT Treatment/Exercise -  07/17/17 1630      Neuro Re-ed    Neuro Re-ed Details   High level balance tasks in // bars w/ emphasis on compliant surfaces, ankle and hip strategy; tandem stance on foam balance beam with intermittent UE support x 2 sets of 20 secs each direction, tandem gait forwards/backwards on foam balance beam x 4 reps with intermittent UE support, standing on rocker board biased vertically maintaining balance x 20 secs with feet apart, feet apart EO with head turns up/down and side to side x 10 reps each, squats on rocker board x 10 reps without support, standing on BOSU maintaining balance x 2 sets of 15 secs (pt needing intermittent min  a), moving BOSU clockwise and counter clockwise x 10 rep each direction.  Pt reporting that she is standing on pillow in tandem stance with eyes closed at home and that this is extremely difficult.  Quickly reviewed HEP and noted that she is to stand in partial tandem.  Pt verbalized understanding.         High level gait/balance with gait around track x 115' tossing ball moving head with ball, gait around track scanning and identifying cards held up by therapist laterally.  Pt with good improvement overall with ROM.       PT Education - 07/17/17 2107    Education Details  incorporating improved cervical ROM into ADLs with decreased guarding    Person(s) Educated  Patient    Methods  Explanation    Comprehension  Verbalized understanding       PT Short Term Goals - 07/17/17 1622      PT SHORT TERM GOAL #1   Title  Pt will be independent with initial HEP in order to indicate improved functional mobility and decreased fall risk.  (Target Date: 07/22/17)    Time  4    Period  Weeks    Status  New      PT SHORT TERM GOAL #2   Title  Pt will improve 5TSS to </=16.50 secs without UE support in order to indicate decreased fall risk and improved functional strength.      Baseline  10.97 secs without UE support    Time  4    Period  Weeks    Status  Achieved       PT SHORT TERM GOAL #3   Title  Pt will improve FGA to 19/30 in order to indicate decreased fall risk.      Baseline  23/30 on 07/17/17    Time  4    Period  Weeks    Status  Achieved      PT SHORT TERM GOAL #4   Title  Pt will ambulate at gait speed of >/=2.62 ft/sec with decreased indication of R foot slap/overt gait deviations indicating safe gait speed.     Time  4    Period  Weeks    Status  New      PT SHORT TERM GOAL #5   Title  Pt will ambulate outdoors over unlevel paved surfaces up to 1000' at mod I level in order to indicate improved community independence.     Time  4    Period  Weeks    Status  New        PT Long Term Goals - 07/17/17 1625      PT LONG TERM GOAL #1   Title  Pt will be independent with final HEP in order to indicate improved functional mobility and decreased fall risk.  (Target Date: 08/21/17)    Time  8    Period  Weeks    Status  New      PT LONG TERM GOAL #2   Title  Pt will improve 5TSS to </=13.50 secs without UE support in order to indicate improved functional strength and decreased fall risk.     Baseline  10.97 secs without UE support on 07/17/17    Time  8    Period  Weeks    Status  Achieved      PT LONG TERM GOAL #3   Title  Pt will improve FGA to >/=25/30 in order to indicate decreased fall  risk.      Time  8    Period  Weeks    Status  Revised      PT LONG TERM GOAL #4   Title  Pt will ambulate >1000' over varying outdoor surfaces, scanning environment at independent level in order to indicate safe community and leisure mobility.     Time  8    Period  Weeks    Status  New            Plan - 07/17/17 2108    Clinical Impression Statement  Session began to assess STGs.  Note marked improvement in 5TSS and FGA, both of which she met LTGs for and had to update LTGs.  Continue to focus on high level balance with emphasis on compliant surfaces and narrowed BOS.      Rehab Potential  Good    Clinical Impairments Affecting Rehab  Potential  severity of cord compression     PT Frequency  2x / week    PT Duration  8 weeks    PT Treatment/Interventions  ADLs/Self Care Home Management;Electrical Stimulation;Gait training;Stair training;Functional mobility training;Therapeutic activities;Therapeutic exercise;Balance training;Neuromuscular re-education;Patient/family education;Orthotic Fit/Training;Passive range of motion;Manual techniques    PT Next Visit Plan  fnish STGs, Continue to address cervical tightness, all therapy without cervical collar, gentle neck ROM, continue to address balance on compliant surfaces and dynamic gait.    Consulted and Agree with Plan of Care  Patient       Patient will benefit from skilled therapeutic intervention in order to improve the following deficits and impairments:  Abnormal gait, Decreased activity tolerance, Decreased balance, Decreased endurance, Decreased mobility, Decreased range of motion, Decreased strength, Hypomobility, Impaired perceived functional ability, Impaired flexibility, Postural dysfunction, Improper body mechanics, Impaired sensation  Visit Diagnosis: Muscle weakness (generalized)  Other abnormalities of gait and mobility  Unsteadiness on feet  Cervicalgia     Problem List Patient Active Problem List   Diagnosis Date Noted  . 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 S/P spinal surgery 05/24/2017  . Other secondary kyphosis, cervical region 05/24/2017  . Anxiety 11/21/2016  . Stenosis of cervical spine with myelopathy ([place]) 08/12/2016  . Routine general medical examination at a health care facility 06/01/2016  . Numbness and tingling in both hands 05/18/2015  . Hypothyroidism 03/24/2015  . History of tachycardia 03/24/2015  . HTN (hypertension), benign 06/12/2013  . ESRD on dialysis [facility]) 06/12/2013    Cameron Sprang, PT, MPT [facility] [address] [place], [place], [address] Phone: [phone]   Fax:   [phone] 07/17/17, 9:12 PM  Name: [name] MRN: [id number] Date of Birth: 1960-12-20

## 2017-07-17 NOTE — Therapy (Signed)
[facility] [address] [place], [place], [address] Phone: [phone]   Fax:  [phone]  Occupational Therapy Treatment  Patient Details  Name: [name] MRN: [id number] Date of Birth: Aug 07, 1960 Referring Provider: Dr. Ashok Pall   Encounter Date: 07/17/2017  OT End of Session - 07/17/17 1535    Visit Number  6    Number of Visits  17    Date for OT Re-Evaluation  09/20/17    Authorization Type  Medicare $3000 visit limt    Authorization Time Period  90 day cert. period  (through 09/20/17)    Authorization - Visit Number  6    Authorization - Number of Visits  10    OT Start Time  7989    OT Stop Time  1615    OT Time Calculation (min)  42 min    Activity Tolerance  Patient tolerated treatment well    Behavior During Therapy  WFL for tasks assessed/performed       Past Medical History:  Diagnosis Date  . Anemia   . Anxiety   . Arthritis   . Claustrophobia   . ESRD (end stage renal disease) on dialysis ([place])    "TTS; [facility]" (03/24/2015)  . Glomerulonephritis   . Heart murmur   . HTN (hypertension)   . Hypothyroidism   . Kidney transplant recipient 01/21/2017  . Pancreatitis   . Renal insufficiency   . Secondary hyperparathyroidism ([facility])    [name] 05/11/2014    Past Surgical History:  Procedure Laterality Date  . ANTERIOR CERVICAL CORPECTOMY N/A 05/24/2017   Procedure: CORPECTOMY CERVICAL FIVE- CERVICAL SIX;  Surgeon: Ashok Pall, MD;  Location: [facility];  Service: Neurosurgery;  Laterality: N/A;  . ARTERIOVENOUS GRAFT PLACEMENT Right 1990's?  . ARTERIOVENOUS GRAFT PLACEMENT Left 07/2002   upper arm/notes 06/02/2010  . ARTERIOVENOUS GRAFT PLACEMENT Right 07/2003   upper arm/notes 06/02/2010  . AV FISTULA PLACEMENT Left 09/2000   upper arm/notes 06/02/2010  . BREAST BIOPSY Left unsure   benign  . COLONOSCOPY    . DILATATION & CURRETTAGE/HYSTEROSCOPY WITH RESECTOCOPE N/A 04/17/2013   Procedure: DILATATION & CURETTAGE/HYSTEROSCOPY WITH RESECTOCOPE;  Surgeon: Marvene Staff, MD;  Location: [facility];  Service: Gynecology;  Laterality: N/A;  . DILATION AND CURETTAGE OF UTERUS    . EYE SURGERY    . KIDNEY TRANSPLANT  1997  . KIDNEY TRANSPLANT  01/21/2017   Dr. Harrington Challenger  . PARATHYROIDECTOMY  03/2000 05/12/2014   w/neck exploration & autotransplantation/notes 06/02/2010; w/neck exploration  . PARATHYROIDECTOMY N/A 05/12/2014   Procedure: PARATHYROIDECTOMY AND NECK EXPLORATION;  Surgeon: Armandina Gemma, MD;  Location: [facility];  Service: General;  Laterality: N/A;  . PERITONEAL CATHETER INSERTION    . PERITONEAL CATHETER REMOVAL  08/1999   [name] 06/02/2010  . POSTERIOR CERVICAL FUSION/FORAMINOTOMY N/A 05/30/2017   Procedure: POSTERIOR CERVICAL Arthrodesis Cervical three - four, cervical four - five, cervical five - six, cervical six - seven;  Surgeon: Ashok Pall, MD;  Location: [facility];  Service: Neurosurgery;  Laterality: N/A;  . POSTERIOR CERVICAL LAMINECTOMY N/A 08/12/2016   Procedure: POSTERIOR CERVICAL LAMINECTOMY CERVICAL THREE CERVICAL FOUR, CERVICAL FOUR CERVICAL FIVE, CERVICAL FIVE- CERVICAL SIX, CERVICAL SIX- CERVICAL SEVEN;  Surgeon: Ashok Pall, MD;  Location: [facility];  Service: Neurosurgery;  Laterality: N/A;  POSTERIOR  . RETINAL DETACHMENT SURGERY Left   . THROMBECTOMY Left 02/2002   fistula/notes 06/02/2010  . THROMBECTOMY / ARTERIOVENOUS GRAFT REVISION  11/2003; 01/2004; 08/07/2005; 08/10/2005;  10/2005   [name] 06/02/2010  . THROMBECTOMY AND REVISION OF ARTERIOVENTOUS (AV) GORETEX  GRAFT Right 01/2002; 11/2003; 01/2004; 08/07/2004; 08/10/2004; 11/13/2005   [name] 5/1/2012Marland Kitchen [name] 5/16/2012Marland Kitchen [name] 5/16/2012Marland Kitchen [name] 5/16/2012Marland Kitchen [name] 06/02/2010; [name] 06/02/2010  . THROMBECTOMY AND REVISION OF ARTERIOVENTOUS (AV) GORETEX  GRAFT Left 08/23/2002; 09/13/2002; 07/08/2003   [name] 06/02/2010; [name] 06/02/2010; [name] 06/02/2010    There were no vitals filed for this visit.  Subjective  Assessment - 07/17/17 1535    Subjective   denies pain    Pertinent History  s/p C5/6/7 anterior corpectomy (with strut graft and anterior plating) and posterior C3-T1 arthrodesis on 05/24/17.  Pt with PMH that includes:  Arthritis, ESRD, s/p kidney transplant 01/21/17, HTN, Hypotyroidism, heart murmur, anxiety, history of C3-C6 posterior decompression 2018    Limitations  Pt reports that she called MD office regarding restrictions and reports that she may begin low-range strengthening, ok to perform overhead reaching, no lifting lb limit, cervical brace for comfort    Patient Stated Goals  improve coordination/strength UEs, return to work    Currently in Pain?  No/denies               Treatment:Yellow theraband exercises low range for shoulder flexion, biceps curls, triceps extension, min v.c, 10-15 reps each Red theraputty exercises for grip and pinch, then pulling small pegs from putty with each hand for fine motor coordination, min v.c               OT Short Term Goals - 07/17/17 1545      OT SHORT TERM GOAL #1   Title  Pt will be independent with initial HEP.--check STGs 07/22/17    Time  4    Period  Weeks    Status  On-going      OT SHORT TERM GOAL #2   Title  Pt will improve coordination for ADLs as shown by completing 9-hole peg test in less than 22sec with R hand and 24sec with L hand.     Baseline  R-24.44, L-29.47sec    Time  4    Period  Weeks    Status  On-going      OT SHORT TERM GOAL #3   Title  Pt will be able to perform overhead reaching to retrieve light object without compensation.    Baseline  75% with compensation    Time  4    Period  Weeks    Status  New      OT SHORT TERM GOAL #4   Title  Pt will improve bilateral grip strength by at least 5lbs to assist with ADLs (opening containers, gripping).    Baseline  R-35lbs, L-32lbs    Time  4    Period  Weeks    Status  On-going      OT SHORT TERM GOAL #5   Title  Pt will improve lateral  pinch strength by at least 4lbs bilaterally for work tasks/ADLs.    Baseline  R-7lbs, L-6lbs    Time  4    Period  Weeks    Status  On-going        OT Long Term Goals - 07/17/17 1544      OT LONG TERM GOAL #1   Title  Pt will be independent with updated HEP.--check LTGs 08/22/17    Time  12    Period  Weeks    Status  New      OT LONG TERM GOAL #2   Title  Pt will be able to retrieve at least 2lb object from overhead shelf with each UE safely.    Time  12    Period  Weeks    Status  New      OT LONG TERM GOAL #3   Title  Pt will perform simulated fine motor work tasks (typing, writing, preparing slides, etc) with min difficulty/sufficient for return to work.    Time  12    Period  Weeks    Status  New      OT LONG TERM GOAL #4   Title  Pt will improve 3point pinch strength by at least 4lbs bilaterally for work tasks/ADLs.    Baseline  R-7bs, L-6lbs    Time  12    Period  Weeks    Status  New      OT LONG TERM GOAL #5   Title  Pt will improve bilateral grip strength by at least 10lbs to assist with ADLs (opening containers, gripping).    Baseline  R-35lbs, L-32lbs    Time  12    Period  Weeks    Status  New      OT LONG TERM GOAL #6   Title  Pt will be able to perform mod complex IADLs (vacuuming, carrying heavier groceries, etc).  mod I.    Time  12    Period  Weeks    Status  New            Plan - 07/17/17 1547    Clinical Impression Statement  Pt is progressing towards goals with improving UE strength during low range strengthening activities.    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      Rehab Potential  Good    OT Frequency  2x / week    OT Duration  12 weeks    OT Treatment/Interventions  Self-care/ADL training;Therapeutic exercise;Moist  Heat;Electrical Stimulation;Fluidtherapy;Paraffin;Neuromuscular education;Splinting;Patient/family education;Energy conservation;Therapist, nutritional;Therapeutic activities;Cryotherapy;Ultrasound;DME and/or AE instruction;Manual Therapy;Passive range of motion;Balance training    Plan  coordination, gentle strengthening; begin checking STGs next week    Consulted and Agree with Plan of Care  Patient       Patient will benefit from skilled therapeutic intervention in order to improve the following deficits and impairments:  Decreased knowledge of use of DME, Pain, Decreased coordination, Decreased mobility, Impaired sensation, Decreased strength, Decreased range of motion, Decreased endurance, Decreased activity tolerance, Decreased balance, Decreased knowledge of precautions, Impaired perceived functional ability, Impaired UE functional use  Visit Diagnosis: Muscle weakness (generalized)  Other lack of coordination    Problem List Patient Active Problem List   Diagnosis Date Noted  . 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 S/P spinal surgery 05/24/2017  . Other secondary kyphosis, cervical region 05/24/2017  . Anxiety 11/21/2016  . Stenosis of cervical spine with myelopathy ([facility]) 08/12/2016  . Routine general medical examination at a health care facility 06/01/2016  . Numbness and tingling in both hands 05/18/2015  . Hypothyroidism 03/24/2015  . History of tachycardia 03/24/2015  . HTN (hypertension), benign 06/12/2013  . ESRD on dialysis [facility]) 06/12/2013    Day Greb 07/17/2017, 3:59 PM  [facility] [address] [place], [place], [address] Phone: [phone]   Fax:  [phone]  Name: TWANISHA FOULK MRN: [id number]  Date of Birth: April 19, 1960

## 2017-07-18 ENCOUNTER — Encounter: Payer: Self-pay | Admitting: Physical Therapy

## 2017-07-18 ENCOUNTER — Ambulatory Visit: Payer: Medicare Other | Admitting: Occupational Therapy

## 2017-07-18 ENCOUNTER — Ambulatory Visit: Payer: Medicare Other | Admitting: Physical Therapy

## 2017-07-18 DIAGNOSIS — R2681 Unsteadiness on feet: Secondary | ICD-10-CM | POA: Diagnosis not present

## 2017-07-18 DIAGNOSIS — M6281 Muscle weakness (generalized): Secondary | ICD-10-CM

## 2017-07-18 DIAGNOSIS — R2689 Other abnormalities of gait and mobility: Secondary | ICD-10-CM | POA: Diagnosis not present

## 2017-07-18 DIAGNOSIS — M542 Cervicalgia: Secondary | ICD-10-CM | POA: Diagnosis not present

## 2017-07-18 DIAGNOSIS — R278 Other lack of coordination: Secondary | ICD-10-CM

## 2017-07-18 NOTE — Therapy (Signed)
[facility] [address] [place], [place], [address] Phone: [phone]   Fax:  [phone]  Physical Therapy Treatment  Patient Details  Name: [name] MRN: [id number] Date of Birth: [birth year] Referring Provider: Dr. Ashok Pall   Encounter Date: 07/18/2017  PT End of Session - 07/18/17 1533    Visit Number  9    Number of Visits  17    Date for PT Re-Evaluation  08/21/17    Authorization Type  Medicare    PT Start Time  1447    PT Stop Time  1529    PT Time Calculation (min)  42 min    Equipment Utilized During Treatment  Gait belt    Activity Tolerance  Patient tolerated treatment well    Behavior During Therapy  [facility] for tasks assessed/performed       Past Medical History:  Diagnosis Date  . Anemia   . Anxiety   . Arthritis   . Claustrophobia   . ESRD (end stage renal disease) on dialysis ([facility])    "TTS; [facility]" (03/24/2015)  . Glomerulonephritis   . Heart murmur   . HTN (hypertension)   . Hypothyroidism   . Kidney transplant recipient 01/21/2017  . Pancreatitis   . Renal insufficiency   . Secondary hyperparathyroidism ([facility])    [name] 05/11/2014    Past Surgical History:  Procedure Laterality Date  . ANTERIOR CERVICAL CORPECTOMY N/A 05/24/2017   Procedure: CORPECTOMY CERVICAL FIVE- CERVICAL SIX;  Surgeon: Ashok Pall, MD;  Location: [facility];  Service: Neurosurgery;  Laterality: N/A;  . ARTERIOVENOUS GRAFT PLACEMENT Right 1990's?  . ARTERIOVENOUS GRAFT PLACEMENT Left 07/2002   upper arm/notes 06/02/2010  . ARTERIOVENOUS GRAFT PLACEMENT Right 07/2003   upper arm/notes 06/02/2010  . AV FISTULA PLACEMENT Left 09/2000   upper arm/notes 06/02/2010  . BREAST BIOPSY Left unsure   benign  . COLONOSCOPY    . DILATATION & CURRETTAGE/HYSTEROSCOPY WITH RESECTOCOPE N/A 04/17/2013   Procedure: DILATATION & CURETTAGE/HYSTEROSCOPY WITH RESECTOCOPE;  Surgeon: Marvene Staff, MD;  Location: [facility];  Service: Gynecology;  Laterality: N/A;  . DILATION AND CURETTAGE OF UTERUS    . EYE SURGERY    . KIDNEY TRANSPLANT  1997  . KIDNEY TRANSPLANT  01/21/2017   Dr. Harrington Challenger  . PARATHYROIDECTOMY  03/2000 05/12/2014   w/neck exploration & autotransplantation/notes 06/02/2010; w/neck exploration  . PARATHYROIDECTOMY N/A 05/12/2014   Procedure: PARATHYROIDECTOMY AND NECK EXPLORATION;  Surgeon: Armandina Gemma, MD;  Location: [facility];  Service: General;  Laterality: N/A;  . PERITONEAL CATHETER INSERTION    . PERITONEAL CATHETER REMOVAL  08/1999   [name] 06/02/2010  . POSTERIOR CERVICAL FUSION/FORAMINOTOMY N/A 05/30/2017   Procedure: POSTERIOR CERVICAL Arthrodesis Cervical three - four, cervical four - five, cervical five - six, cervical six - seven;  Surgeon: Ashok Pall, MD;  Location: [facility];  Service: Neurosurgery;  Laterality: N/A;  . POSTERIOR CERVICAL LAMINECTOMY N/A 08/12/2016   Procedure: POSTERIOR CERVICAL LAMINECTOMY CERVICAL THREE CERVICAL FOUR, CERVICAL FOUR CERVICAL FIVE, CERVICAL FIVE- CERVICAL SIX, CERVICAL SIX- CERVICAL SEVEN;  Surgeon: Ashok Pall, MD;  Location: [facility];  Service: Neurosurgery;  Laterality: N/A;  POSTERIOR  . RETINAL DETACHMENT SURGERY Left   . THROMBECTOMY Left 02/2002   fistula/notes 06/02/2010  . THROMBECTOMY / ARTERIOVENOUS GRAFT REVISION  11/2003; 01/2004; 08/07/2005; 08/10/2005; 10/2005   [name] 06/02/2010  . THROMBECTOMY AND REVISION OF ARTERIOVENTOUS (AV) GORETEX  GRAFT Right 01/2002; 11/2003; 01/2004; 08/07/2004; 08/10/2004; 11/13/2005   /  notes 05/18/2010; [name] 5/16/2012Marland Kitchen [name] 5/16/2012Marland Kitchen [name] 5/16/2012Marland Kitchen [name] 5/16/2012Marland Kitchen [name] 06/02/2010  . THROMBECTOMY AND REVISION OF ARTERIOVENTOUS (AV) GORETEX  GRAFT Left 08/23/2002; 09/13/2002; 07/08/2003   [name] 06/02/2010; [name] 06/02/2010; [name] 06/02/2010    There were no vitals filed for this visit.  Subjective Assessment - 07/18/17 1443    Subjective  Pt doesnt report any pain but still has some tightness in neck.     Pertinent  History  s/p kidney transplant 01/21/17    Limitations  House hold activities;Walking    How long can you walk comfortably?  Is able to ambulate approx 5-15 mins before she notices R foot dragging.     Patient Stated Goals  "I want to be able to walk without feeling like I"m getting ready to fall."    Currently in Pain?  No/denies            [facility] Adult PT Treatment/Exercise - 07/18/17 1541      Ambulation/Gait   Ambulation/Gait  Yes    Ambulation/Gait Assistance  6: Modified independent (Device/Increase time)    Ambulation Distance (Feet)  1000 Feet    Assistive device  None    Gait Pattern  Step-through pattern;Decreased arm swing - right;Decreased arm swing - left;Decreased stride length;Decreased dorsiflexion - right;Decreased dorsiflexion - left;Right foot flat;Right flexed knee in stance;Trunk flexed    Ambulation Surface  Outdoor;Unlevel;Other (comment) pavement      Balance   Balance Assessed  Yes      High Level Balance   High Level Balance Activities  Backward walking;Other (comment) side step while squating, Toe and heel walking 3 sets each    High Level Balance Comments  perfomed on blue mat near conter top for support if needed. No UE support. Verbal cues needed for correct technique      Neuro Re-ed    Neuro Re-ed Details   High level balance in corner for safety on airex nbos with EC and side<>side, up<>down head mvts then partial tandem with side<>side and up<>down head mvt. 30 secs x3 each. Intermittent UE support with partial tandem up<>down head movements. Pt was also instructed on rocker board holding steady for 30 sec x 3 with EC and progressing to side<>side head movements. Needing occasional UE support to maintain balance.      Neck Exercises: Seated   Neck Retraction  1 rep;10 reps;5 secs    Cervical Rotation  Both;10 reps      Manual Therapy   Manual Therapy  Soft tissue mobilization;Myofascial release;Neural Stretch;Manual Traction    Manual therapy  comments  all maunal therapy performed for decreased tightness of cervical and thoracic area.     Soft tissue mobilization  to bil upper traps, scalenes, levators and cervical paraspinals.             Myofascial Release  to bil cervical paraspinals, upper traps, rhomboids and scalenes.      Manual Traction  suboccipital release for 1 minute holds x 3 reps; gentle cervical distraction for increased muslce extensibility     Neural Stretch  concurrent with gentle manual distraction of cervical spine- passive overpressure to shoulder in inferior direction for increased passive stretching for 1 minute hold x 3 on each side      Neck Exercises: Stretches   Upper Trapezius Stretch  3 reps;30 seconds;Left;Right         PT Short Term Goals - 07/18/17 1539      PT SHORT TERM GOAL #1   Title  Pt will be independent with initial HEP in order to indicate improved functional mobility and decreased fall risk.  (Target Date: 07/22/17)    Baseline  07/18/17: per pt report    Time  --    Period  --    Status  Achieved      PT SHORT TERM GOAL #2   Title  Pt will improve 5TSS to </=16.50 secs without UE support in order to indicate decreased fall risk and improved functional strength.      Baseline  10.97 secs without UE support    Time  4    Period  Weeks    Status  Achieved      PT SHORT TERM GOAL #3   Title  Pt will improve FGA to 19/30 in order to indicate decreased fall risk.      Baseline  23/30 on 07/17/17    Time  4    Period  Weeks    Status  Achieved      PT SHORT TERM GOAL #4   Title  Pt will ambulate at gait speed of >/=2.62 ft/sec with decreased indication of R foot slap/overt gait deviations indicating safe gait speed.     Time  4    Period  Weeks    Status  New      PT SHORT TERM GOAL #5   Title  Pt will ambulate outdoors over unlevel paved surfaces up to 1000' at mod I level in order to indicate improved community independence.     Baseline  07/18/17, goal met.    Time  4    Period   Weeks    Status  Achieved           PT Long Term Goals - 07/17/17 1625      PT LONG TERM GOAL #1   Title  Pt will be independent with final HEP in order to indicate improved functional mobility and decreased fall risk.  (Target Date: 08/21/17)    Time  8    Period  Weeks    Status  New      PT LONG TERM GOAL #2   Title  Pt will improve 5TSS to </=13.50 secs without UE support in order to indicate improved functional strength and decreased fall risk.     Baseline  10.97 secs without UE support on 07/17/17    Time  8    Period  Weeks    Status  Achieved      PT LONG TERM GOAL #3   Title  Pt will improve FGA to >/=25/30 in order to indicate decreased fall risk.      Time  8    Period  Weeks    Status  Revised      PT LONG TERM GOAL #4   Title  Pt will ambulate >1000' over varying outdoor surfaces, scanning environment at independent level in order to indicate safe community and leisure mobility.     Time  8    Period  Weeks    Status  New            Plan - 07/18/17 1534    Clinical Impression Statement  Today's skilled session focused on balance, LE strengthening, assessing STG's goals and cervcial ROM. Pt has met some goals and is progressing toward other goals. Pt would benefit from further PT to meet umet goals.    Rehab Potential  Good    Clinical Impairments Affecting Rehab Potential  severity of cord compression     PT Frequency  2x / week    PT Duration  8 weeks    PT Treatment/Interventions  ADLs/Self Care Home Management;Electrical Stimulation;Gait training;Stair training;Functional mobility training;Therapeutic activities;Therapeutic exercise;Balance training;Neuromuscular re-education;Patient/family education;Orthotic Fit/Training;Passive range of motion;Manual techniques    PT Next Visit Plan  Continue to address cervical tightness, all therapy without cervical collar, gentle neck ROM, continue to address balance on compliant surfaces with EC and dynamic gait.     Consulted and Agree with Plan of Care  Patient       Patient will benefit from skilled therapeutic intervention in order to improve the following deficits and impairments:  Abnormal gait, Decreased activity tolerance, Decreased balance, Decreased endurance, Decreased mobility, Decreased range of motion, Decreased strength, Hypomobility, Impaired perceived functional ability, Impaired flexibility, Postural dysfunction, Improper body mechanics, Impaired sensation  Visit Diagnosis: Muscle weakness (generalized)  Other abnormalities of gait and mobility  Unsteadiness on feet  Cervicalgia     Problem List Patient Active Problem List   Diagnosis Date Noted  . 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 S/P spinal surgery 05/24/2017  . Other secondary kyphosis, cervical region 05/24/2017  . Anxiety 11/21/2016  . Stenosis of cervical spine with myelopathy ([place] [facility]) 08/12/2016  . Routine general medical examination at a health care facility 06/01/2016  . Numbness and tingling in both hands 05/18/2015  . Hypothyroidism 03/24/2015  . History of tachycardia 03/24/2015  . HTN (hypertension), benign 06/12/2013  . ESRD on dialysis [facility]) 06/12/2013    Halina Andreas, [place] 07/18/2017, 4:00 PM  [facility] [address] [place], [place], [address] Phone: [phone]   Fax:  [phone]  Name: [name] MRN: [id number] Date of Birth: [birth year]

## 2017-07-18 NOTE — Therapy (Signed)
[facility] [address] [place], [place], [address] Phone: [phone]   Fax:  [phone]  Occupational Therapy Treatment  Patient Details  Name: [name] MRN: [id number] Date of Birth: April 27, 1960 Referring Provider: Dr. Ashok Pall   Encounter Date: 07/18/2017  OT End of Session - 07/18/17 1413    Visit Number  7    Number of Visits  17    Date for OT Re-Evaluation  09/20/17    Authorization Type  Medicare $3000 visit limt    Authorization Time Period  90 day cert. period  (through 09/20/17)    Authorization - Visit Number  7    Authorization - Number of Visits  10    OT Start Time  1410 late start    OT Stop Time  1445    OT Time Calculation (min)  35 min    Activity Tolerance  Patient tolerated treatment well    Behavior During Therapy  WFL for tasks assessed/performed       Past Medical History:  Diagnosis Date  . Anemia   . Anxiety   . Arthritis   . Claustrophobia   . ESRD (end stage renal disease) on dialysis ([facility])    "TTS; [facility]" (03/24/2015)  . Glomerulonephritis   . Heart murmur   . HTN (hypertension)   . Hypothyroidism   . Kidney transplant recipient 01/21/2017  . Pancreatitis   . Renal insufficiency   . Secondary hyperparathyroidism ([place])    [name] 05/11/2014    Past Surgical History:  Procedure Laterality Date  . ANTERIOR CERVICAL CORPECTOMY N/A 05/24/2017   Procedure: CORPECTOMY CERVICAL FIVE- CERVICAL SIX;  Surgeon: Ashok Pall, MD;  Location: [facility];  Service: Neurosurgery;  Laterality: N/A;  . ARTERIOVENOUS GRAFT PLACEMENT Right 1990's?  . ARTERIOVENOUS GRAFT PLACEMENT Left 07/2002   upper arm/notes 06/02/2010  . ARTERIOVENOUS GRAFT PLACEMENT Right 07/2003   upper arm/notes 06/02/2010  . AV FISTULA PLACEMENT Left 09/2000   upper arm/notes 06/02/2010  . BREAST BIOPSY Left unsure   benign  . COLONOSCOPY    . DILATATION & CURRETTAGE/HYSTEROSCOPY WITH RESECTOCOPE N/A  04/17/2013   Procedure: DILATATION & CURETTAGE/HYSTEROSCOPY WITH RESECTOCOPE;  Surgeon: Marvene Staff, MD;  Location: [facility];  Service: Gynecology;  Laterality: N/A;  . DILATION AND CURETTAGE OF UTERUS    . EYE SURGERY    . KIDNEY TRANSPLANT  1997  . KIDNEY TRANSPLANT  01/21/2017   Dr. Harrington Challenger  . PARATHYROIDECTOMY  03/2000 05/12/2014   w/neck exploration & autotransplantation/notes 06/02/2010; w/neck exploration  . PARATHYROIDECTOMY N/A 05/12/2014   Procedure: PARATHYROIDECTOMY AND NECK EXPLORATION;  Surgeon: Armandina Gemma, MD;  Location: [facility];  Service: General;  Laterality: N/A;  . PERITONEAL CATHETER INSERTION    . PERITONEAL CATHETER REMOVAL  08/1999   [name] 06/02/2010  . POSTERIOR CERVICAL FUSION/FORAMINOTOMY N/A 05/30/2017   Procedure: POSTERIOR CERVICAL Arthrodesis Cervical three - four, cervical four - five, cervical five - six, cervical six - seven;  Surgeon: Ashok Pall, MD;  Location: [facility];  Service: Neurosurgery;  Laterality: N/A;  . POSTERIOR CERVICAL LAMINECTOMY N/A 08/12/2016   Procedure: POSTERIOR CERVICAL LAMINECTOMY CERVICAL THREE CERVICAL FOUR, CERVICAL FOUR CERVICAL FIVE, CERVICAL FIVE- CERVICAL SIX, CERVICAL SIX- CERVICAL SEVEN;  Surgeon: Ashok Pall, MD;  Location: [facility];  Service: Neurosurgery;  Laterality: N/A;  POSTERIOR  . RETINAL DETACHMENT SURGERY Left   . THROMBECTOMY Left 02/2002   fistula/notes 06/02/2010  . THROMBECTOMY / ARTERIOVENOUS GRAFT REVISION  11/2003;  01/2004; 08/07/2005; 08/10/2005; 10/2005   [name] 06/02/2010  . THROMBECTOMY AND REVISION OF ARTERIOVENTOUS (AV) GORETEX  GRAFT Right 01/2002; 11/2003; 01/2004; 08/07/2004; 08/10/2004; 11/13/2005   [name] 5/1/2012Marland Kitchen [name] 5/16/2012Marland Kitchen [name] 5/16/2012Marland Kitchen [name] 5/16/2012Marland Kitchen [name] 06/02/2010; [name] 06/02/2010  . THROMBECTOMY AND REVISION OF ARTERIOVENTOUS (AV) GORETEX  GRAFT Left 08/23/2002; 09/13/2002; 07/08/2003   [name] 06/02/2010; [name] 06/02/2010; [name] 06/02/2010    There were no vitals filed for this  visit.  Subjective Assessment - 07/18/17 1410    Subjective   denies pain    Pertinent History  s/p C5/6/7 anterior corpectomy (with strut graft and anterior plating) and posterior C3-T1 arthrodesis on 05/24/17.  Pt with PMH that includes:  Arthritis, ESRD, s/p kidney transplant 01/21/17, HTN, Hypotyroidism, heart murmur, anxiety, history of C3-C6 posterior decompression 2018    Limitations  Pt reports that she called MD office regarding restrictions and reports that she may begin low-range strengthening, ok to perform overhead reaching, no lifting lb limit, cervical brace for comfort    Patient Stated Goals  improve coordination/strength UEs, return to work    Currently in Pain?  No/denies              Treatment:Supine closed chain shoulder flexion and chest press 15 reps each min v.c followed by overhead reaching to remove items from cabinets, then placing large pegs into pegboard with right and left UE in seated and standing, occasional min difficulty. Pt demonstrates improved overhead functional reach. Gripper set at level 1 to pick up 1 inch blocks, min difficulty/ fatigue. Functional reach with sustained pinch to place graded clothespins on vertical antennae with RUE, min difficulty.               OT Short Term Goals - 07/18/17 1414      OT SHORT TERM GOAL #1   Title  Pt will be independent with initial HEP.--check STGs 07/22/17    Time  4    Period  Weeks    Status  On-going      OT SHORT TERM GOAL #2   Title  Pt will improve coordination for ADLs as shown by completing 9-hole peg test in less than 22sec with R hand and 24sec with L hand.     Baseline  R-24.44, L-29.47sec    Time  4    Period  Weeks    Status  On-going -RUE 26.22 secs, LUE 28.63     OT SHORT TERM GOAL #3   Title  Pt will be able to perform overhead reaching to retrieve light object without compensation.    Baseline  75% with compensation    Time  4    Period  Weeks    Status  Achieved      OT  SHORT TERM GOAL #4   Title  Pt will improve bilateral grip strength by at least 5lbs to assist with ADLs (opening containers, gripping).    Baseline  R-35lbs, L-32lbs    Time  4    Period  Weeks    Status  On-going      OT SHORT TERM GOAL #5   Title  Pt will improve lateral pinch strength by at least 4lbs bilaterally for work tasks/ADLs.    Baseline  R-7lbs, L-6lbs    Time  4    Period  Weeks    Status  On-going        OT Long Term Goals - 07/17/17 1544      OT LONG TERM GOAL #1  Title  Pt will be independent with updated HEP.--check LTGs 08/22/17    Time  12    Period  Weeks    Status  New      OT LONG TERM GOAL #2   Title  Pt will be able to retrieve at least 2lb object from overhead shelf with each UE safely.    Time  12    Period  Weeks    Status  New      OT LONG TERM GOAL #3   Title  Pt will perform simulated fine motor work tasks (typing, writing, preparing slides, etc) with min difficulty/sufficient for return to work.    Time  12    Period  Weeks    Status  New      OT LONG TERM GOAL #4   Title  Pt will improve 3point pinch strength by at least 4lbs bilaterally for work tasks/ADLs.    Baseline  R-7bs, L-6lbs    Time  12    Period  Weeks    Status  New      OT LONG TERM GOAL #5   Title  Pt will improve bilateral grip strength by at least 10lbs to assist with ADLs (opening containers, gripping).    Baseline  R-35lbs, L-32lbs    Time  12    Period  Weeks    Status  New      OT LONG TERM GOAL #6   Title  Pt will be able to perform mod complex IADLs (vacuuming, carrying heavier groceries, etc).  mod I.    Time  12    Period  Weeks    Status  New            Plan - 07/18/17 1435    Clinical Impression Statement  Pt is progressing towards goals. She demonstrates improved overhead functional reaching.    Occupational performance deficits (Please refer to evaluation for details):  ADL's;IADL's;Work;Leisure    Rehab Potential  Good    OT Frequency  2x  / week    OT Duration  12 weeks    OT Treatment/Interventions  Self-care/ADL training;Therapeutic exercise;Moist Heat;Electrical Stimulation;Fluidtherapy;Paraffin;Neuromuscular education;Splinting;Patient/family education;Energy conservation;Therapist, nutritional;Therapeutic activities;Cryotherapy;Ultrasound;DME and/or AE instruction;Manual Therapy;Passive range of motion;Balance training    Plan  begin checking STGs next week, check HEP and add to prn    Consulted and Agree with Plan of Care  Patient       Patient will benefit from skilled therapeutic intervention in order to improve the following deficits and impairments:  Decreased knowledge of use of DME, Pain, Decreased coordination, Decreased mobility, Impaired sensation, Decreased strength, Decreased range of motion, Decreased endurance, Decreased activity tolerance, Decreased balance, Decreased knowledge of precautions, Impaired perceived functional ability, Impaired UE functional use  Visit Diagnosis: Muscle weakness (generalized)  Other lack of coordination  Unsteadiness on feet  Other abnormalities of gait and mobility    Problem List Patient Active Problem List   Diagnosis Date Noted  . 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 S/P spinal surgery 05/24/2017  . Other secondary kyphosis, cervical region 05/24/2017  . Anxiety 11/21/2016  . Stenosis of cervical spine with myelopathy ([facility]) 08/12/2016  . Routine general medical examination at a health care facility 06/01/2016  . Numbness and tingling in both hands 05/18/2015  . Hypothyroidism 03/24/2015  . History of tachycardia 03/24/2015  . HTN (hypertension), benign 06/12/2013  . ESRD on dialysis ([facility]) 06/12/2013    RINE,KATHRYN 07/18/2017, 2:37 PM  [facility] [address] [place], [place], [address] Phone: [phone]   Fax:  [phone]  Name: KAMELIA LAMPKINS MRN: [id number] Date of Birth: [birth year]

## 2017-07-24 ENCOUNTER — Encounter: Payer: Self-pay | Admitting: Occupational Therapy

## 2017-07-24 ENCOUNTER — Ambulatory Visit: Payer: Medicare Other | Admitting: Rehabilitation

## 2017-07-24 ENCOUNTER — Encounter: Payer: Self-pay | Admitting: Rehabilitation

## 2017-07-24 ENCOUNTER — Ambulatory Visit: Payer: Medicare Other | Admitting: Occupational Therapy

## 2017-07-24 DIAGNOSIS — R2681 Unsteadiness on feet: Secondary | ICD-10-CM | POA: Diagnosis not present

## 2017-07-24 DIAGNOSIS — R2689 Other abnormalities of gait and mobility: Secondary | ICD-10-CM | POA: Diagnosis not present

## 2017-07-24 DIAGNOSIS — M542 Cervicalgia: Secondary | ICD-10-CM

## 2017-07-24 DIAGNOSIS — M6281 Muscle weakness (generalized): Secondary | ICD-10-CM

## 2017-07-24 DIAGNOSIS — R278 Other lack of coordination: Secondary | ICD-10-CM

## 2017-07-24 NOTE — Therapy (Signed)
[facility] [address] [place], [place], [address] Phone: [phone]   Fax:  [phone]  Physical Therapy Treatment  Patient Details  Name: [name] MRN: [id number] Date of Birth: November 10, 1960 Referring Provider: Dr. Ashok Pall   Encounter Date: 07/24/2017  PT End of Session - 07/24/17 1628    Visit Number  10    Number of Visits  17    Date for PT Re-Evaluation  08/21/17    Authorization Type  Medicare    PT Start Time  9449    PT Stop Time  1700    PT Time Calculation (min)  43 min    Equipment Utilized During Treatment  Gait belt    Activity Tolerance  Patient tolerated treatment well    Behavior During Therapy  [facility] for tasks assessed/performed       Past Medical History:  Diagnosis Date  . Anemia   . Anxiety   . Arthritis   . Claustrophobia   . ESRD (end stage renal disease) on dialysis ([facility])    "TTS; [facility]" (03/24/2015)  . Glomerulonephritis   . Heart murmur   . HTN (hypertension)   . Hypothyroidism   . Kidney transplant recipient 01/21/2017  . Pancreatitis   . Renal insufficiency   . Secondary hyperparathyroidism ([facility])    [name] 05/11/2014    Past Surgical History:  Procedure Laterality Date  . ANTERIOR CERVICAL CORPECTOMY N/A 05/24/2017   Procedure: CORPECTOMY CERVICAL FIVE- CERVICAL SIX;  Surgeon: Ashok Pall, MD;  Location: [facility];  Service: Neurosurgery;  Laterality: N/A;  . ARTERIOVENOUS GRAFT PLACEMENT Right 1990's?  . ARTERIOVENOUS GRAFT PLACEMENT Left 07/2002   upper arm/notes 06/02/2010  . ARTERIOVENOUS GRAFT PLACEMENT Right 07/2003   upper arm/notes 06/02/2010  . AV FISTULA PLACEMENT Left 09/2000   upper arm/notes 06/02/2010  . BREAST BIOPSY Left unsure   benign  . COLONOSCOPY    . DILATATION & CURRETTAGE/HYSTEROSCOPY WITH RESECTOCOPE N/A 04/17/2013   Procedure: DILATATION & CURETTAGE/HYSTEROSCOPY WITH RESECTOCOPE;  Surgeon: Marvene Staff, MD;  Location: [facility];  Service: Gynecology;  Laterality: N/A;  . DILATION AND CURETTAGE OF UTERUS    . EYE SURGERY    . KIDNEY TRANSPLANT  1997  . KIDNEY TRANSPLANT  01/21/2017   Dr. Harrington Challenger  . PARATHYROIDECTOMY  03/2000 05/12/2014   w/neck exploration & autotransplantation/notes 06/02/2010; w/neck exploration  . PARATHYROIDECTOMY N/A 05/12/2014   Procedure: PARATHYROIDECTOMY AND NECK EXPLORATION;  Surgeon: Armandina Gemma, MD;  Location: [facility];  Service: General;  Laterality: N/A;  . PERITONEAL CATHETER INSERTION    . PERITONEAL CATHETER REMOVAL  08/1999   [name] 06/02/2010  . POSTERIOR CERVICAL FUSION/FORAMINOTOMY N/A 05/30/2017   Procedure: POSTERIOR CERVICAL Arthrodesis Cervical three - four, cervical four - five, cervical five - six, cervical six - seven;  Surgeon: Ashok Pall, MD;  Location: [facility];  Service: Neurosurgery;  Laterality: N/A;  . POSTERIOR CERVICAL LAMINECTOMY N/A 08/12/2016   Procedure: POSTERIOR CERVICAL LAMINECTOMY CERVICAL THREE CERVICAL FOUR, CERVICAL FOUR CERVICAL FIVE, CERVICAL FIVE- CERVICAL SIX, CERVICAL SIX- CERVICAL SEVEN;  Surgeon: Ashok Pall, MD;  Location: [facility];  Service: Neurosurgery;  Laterality: N/A;  POSTERIOR  . RETINAL DETACHMENT SURGERY Left   . THROMBECTOMY Left 02/2002   fistula/notes 06/02/2010  . THROMBECTOMY / ARTERIOVENOUS GRAFT REVISION  11/2003; 01/2004; 08/07/2005; 08/10/2005; 10/2005   [name] 06/02/2010  . THROMBECTOMY AND REVISION OF ARTERIOVENTOUS (AV) GORETEX  GRAFT Right 01/2002; 11/2003; 01/2004; 08/07/2004; 08/10/2004; 11/13/2005   /  notes 05/18/2010; [name] 5/16/2012Marland Kitchen [name] 5/16/2012Marland Kitchen [name] 5/16/2012Marland Kitchen [name] 5/16/2012Marland Kitchen [name] 06/02/2010  . THROMBECTOMY AND REVISION OF ARTERIOVENTOUS (AV) GORETEX  GRAFT Left 08/23/2002; 09/13/2002; 07/08/2003   [name] 06/02/2010; [name] 06/02/2010; [name] 06/02/2010    There were no vitals filed for this visit.  Subjective Assessment - 07/24/17 1619    Subjective  Pt reports air conditioning went out over weekend, but is back on now.      Pertinent History  s/p kidney transplant 01/21/17    Limitations  House hold activities;Walking    How long can you walk comfortably?  Is able to ambulate approx 5-15 mins before she notices R foot dragging.     Patient Stated Goals  "I want to be able to walk without feeling like I"m getting ready to fall."    Currently in Pain?  No/denies just tightness         [facility] PT Assessment - 07/24/17 1630      ROM / Strength   AROM / PROM / Strength  AROM      AROM   Overall AROM   Deficits    Overall AROM Comments  Utilized inclinometer for all measurements except goniometer for cervical rotation.     AROM Assessment Site  Cervical    Cervical Flexion  35    Cervical Extension  21    Cervical - Right Side Bend  29    Cervical - Left Side Bend  38    Cervical - Right Rotation  42    Cervical - Left Rotation  40                   [facility] Adult PT Treatment/Exercise - 07/24/17 1635      Neuro Re-ed    Neuro Re-ed Details   Continue high level balance in // bars to address ankle/hip/step strategy along with compliant surfaces and SLS; stance on small rocker board moving opposite LE from toe touch on rocker board to cone and back x 10 reps on each LE with light single UE support, maintaining balance on rocker board with feet apart EO head motion side/side and up/down x 10 reps, standing on foam balance beam maintaining balance x 2 sets of 20 secs, tapping heel to ground and back to beam alternating LEs x 10 reps each with intermittent UE support progressing to tapping heel forward, toe backwards and then back to midline x 10 reps each with intermittent UE support, wall bumps on balance beam with feet apart x 10 reps, feet narrowed and slightly further forward x 10 reps each with 5 sec hold in upright position.  Cues for relaxed posture.        Exercises   Exercises  Other Exercises    Other Exercises   Ended session with standing shoulder extension with yellow band x 10 reps with cues  for posture and improved scapular depression, standing scapular retraction x 10 reps with yellow theraband again with cues for relaxed posture and technique.  Pt with very weak trunk and back muscles during session, however did not add to HEP at this time due to PT wanting to ensuring proper technique.               PT Education - 07/24/17 2055    Education Details  asking MD about expected ROM outcomes.     Person(s) Educated  Patient    Methods  Explanation    Comprehension  Verbalized understanding  PT Short Term Goals - 07/18/17 1539      PT SHORT TERM GOAL #1   Title  Pt will be independent with initial HEP in order to indicate improved functional mobility and decreased fall risk.  (Target Date: 07/22/17)    Baseline  07/18/17: per pt report    Time  --    Period  --    Status  Achieved      PT SHORT TERM GOAL #2   Title  Pt will improve 5TSS to </=16.50 secs without UE support in order to indicate decreased fall risk and improved functional strength.      Baseline  10.97 secs without UE support    Time  4    Period  Weeks    Status  Achieved      PT SHORT TERM GOAL #3   Title  Pt will improve FGA to 19/30 in order to indicate decreased fall risk.      Baseline  23/30 on 07/17/17    Time  4    Period  Weeks    Status  Achieved      PT SHORT TERM GOAL #4   Title  Pt will ambulate at gait speed of >/=2.62 ft/sec with decreased indication of R foot slap/overt gait deviations indicating safe gait speed.     Time  4    Period  Weeks    Status  New      PT SHORT TERM GOAL #5   Title  Pt will ambulate outdoors over unlevel paved surfaces up to 1000' at mod I level in order to indicate improved community independence.     Baseline  07/18/17, goal met.    Time  4    Period  Weeks    Status  Achieved        PT Long Term Goals - 07/24/17 2059      PT LONG TERM GOAL #1   Title  Pt will be independent with final HEP in order to indicate improved functional mobility  and decreased fall risk.  (Target Date: 08/21/17)    Time  8    Period  Weeks    Status  New      PT LONG TERM GOAL #2   Title  Pt will improve 5TSS to </=13.50 secs without UE support in order to indicate improved functional strength and decreased fall risk.     Baseline  10.97 secs without UE support on 07/17/17    Time  8    Period  Weeks    Status  Achieved      PT LONG TERM GOAL #3   Title  Pt will improve FGA to >/=25/30 in order to indicate decreased fall risk.      Time  8    Period  Weeks    Status  Revised      PT LONG TERM GOAL #4   Title  Pt will ambulate >1000' over varying outdoor surfaces, scanning environment at independent level in order to indicate safe community and leisure mobility.     Time  8    Period  Weeks    Status  New      PT LONG TERM GOAL #5   Title  Pt will improve cervical rotation by 10 deg bilaterally in order to indicate improved safety with driving (when cleared to be without cervical collar).      Time  8    Period  Weeks  Status  New            Plan - 07/24/17 2056    Clinical Impression Statement  Session focused on more recent cervical ROM assessment and making LTG to address.  Also continue to focus on high level balance to elicit ankle/hip and stepping strategy and work on compliant surfaces.      Rehab Potential  Good    Clinical Impairments Affecting Rehab Potential  severity of cord compression     PT Frequency  2x / week    PT Duration  8 weeks    PT Treatment/Interventions  ADLs/Self Care Home Management;Electrical Stimulation;Gait training;Stair training;Functional mobility training;Therapeutic activities;Therapeutic exercise;Balance training;Neuromuscular re-education;Patient/family education;Orthotic Fit/Training;Passive range of motion;Manual techniques    PT Next Visit Plan  finish STGs, Continue to address cervical tightness, all therapy without cervical collar, gentle neck ROM, continue to address balance on compliant  surfaces with EC and dynamic gait.    Consulted and Agree with Plan of Care  Patient       Patient will benefit from skilled therapeutic intervention in order to improve the following deficits and impairments:  Abnormal gait, Decreased activity tolerance, Decreased balance, Decreased endurance, Decreased mobility, Decreased range of motion, Decreased strength, Hypomobility, Impaired perceived functional ability, Impaired flexibility, Postural dysfunction, Improper body mechanics, Impaired sensation  Visit Diagnosis: Muscle weakness (generalized)  Unsteadiness on feet  Other abnormalities of gait and mobility  Cervicalgia     Problem List Patient Active Problem List   Diagnosis Date Noted  . 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 S/P spinal surgery 05/24/2017  . Other secondary kyphosis, cervical region 05/24/2017  . Anxiety 11/21/2016  . Stenosis of cervical spine with myelopathy ([place]) 08/12/2016  . Routine general medical examination at a health care facility 06/01/2016  . Numbness and tingling in both hands 05/18/2015  . Hypothyroidism 03/24/2015  . History of tachycardia 03/24/2015  . HTN (hypertension), benign 06/12/2013  . ESRD on dialysis [facility]) 06/12/2013    Cameron Sprang, PT, MPT [facility] [address] [place], [place], [address] Phone: [phone]   Fax:  [phone] 07/24/17, 9:00 PM  Name: IRIDIANA FONNER MRN: [id number] Date of Birth: 03-17-1960

## 2017-07-24 NOTE — Therapy (Signed)
[facility] [address] [place], [place], [address] Phone: [phone]   Fax:  [phone]  Occupational Therapy Treatment  Patient Details  Name: [name] MRN: [id number] Date of Birth: 09-08-1960 Referring Provider: Dr. Ashok Pall   Encounter Date: 07/24/2017  OT End of Session - 07/24/17 1631    Visit Number  8    Number of Visits  17    Date for OT Re-Evaluation  09/20/17    Authorization Type  Medicare $3000 visit limt will need PN every 10th visit    Authorization Time Period  72 day cert. period  (through 09/20/17)    Authorization - Visit Number  8    Authorization - Number of Visits  10    OT Start Time  1534    OT Stop Time  1615    OT Time Calculation (min)  41 min    Activity Tolerance  Patient tolerated treatment well       Past Medical History:  Diagnosis Date  . Anemia   . Anxiety   . Arthritis   . Claustrophobia   . ESRD (end stage renal disease) on dialysis ([place])    "TTS; [place]" (03/24/2015)  . Glomerulonephritis   . Heart murmur   . HTN (hypertension)   . Hypothyroidism   . Kidney transplant recipient 01/21/2017  . Pancreatitis   . Renal insufficiency   . Secondary hyperparathyroidism ([place])    [name] 05/11/2014    Past Surgical History:  Procedure Laterality Date  . ANTERIOR CERVICAL CORPECTOMY N/A 05/24/2017   Procedure: CORPECTOMY CERVICAL FIVE- CERVICAL SIX;  Surgeon: Ashok Pall, MD;  Location: [facility];  Service: Neurosurgery;  Laterality: N/A;  . ARTERIOVENOUS GRAFT PLACEMENT Right 1990's?  . ARTERIOVENOUS GRAFT PLACEMENT Left 07/2002   upper arm/notes 06/02/2010  . ARTERIOVENOUS GRAFT PLACEMENT Right 07/2003   upper arm/notes 06/02/2010  . AV FISTULA PLACEMENT Left 09/2000   upper arm/notes 06/02/2010  . BREAST BIOPSY Left unsure   benign  . COLONOSCOPY    . DILATATION & CURRETTAGE/HYSTEROSCOPY WITH RESECTOCOPE N/A 04/17/2013   Procedure: DILATATION &  CURETTAGE/HYSTEROSCOPY WITH RESECTOCOPE;  Surgeon: Marvene Staff, MD;  Location: [facility];  Service: Gynecology;  Laterality: N/A;  . DILATION AND CURETTAGE OF UTERUS    . EYE SURGERY    . KIDNEY TRANSPLANT  1997  . KIDNEY TRANSPLANT  01/21/2017   Dr. Harrington Challenger  . PARATHYROIDECTOMY  03/2000 05/12/2014   w/neck exploration & autotransplantation/notes 06/02/2010; w/neck exploration  . PARATHYROIDECTOMY N/A 05/12/2014   Procedure: PARATHYROIDECTOMY AND NECK EXPLORATION;  Surgeon: Armandina Gemma, MD;  Location: [facility];  Service: General;  Laterality: N/A;  . PERITONEAL CATHETER INSERTION    . PERITONEAL CATHETER REMOVAL  08/1999   [name] 06/02/2010  . POSTERIOR CERVICAL FUSION/FORAMINOTOMY N/A 05/30/2017   Procedure: POSTERIOR CERVICAL Arthrodesis Cervical three - four, cervical four - five, cervical five - six, cervical six - seven;  Surgeon: Ashok Pall, MD;  Location: [facility];  Service: Neurosurgery;  Laterality: N/A;  . POSTERIOR CERVICAL LAMINECTOMY N/A 08/12/2016   Procedure: POSTERIOR CERVICAL LAMINECTOMY CERVICAL THREE CERVICAL FOUR, CERVICAL FOUR CERVICAL FIVE, CERVICAL FIVE- CERVICAL SIX, CERVICAL SIX- CERVICAL SEVEN;  Surgeon: Ashok Pall, MD;  Location: [facility];  Service: Neurosurgery;  Laterality: N/A;  POSTERIOR  . RETINAL DETACHMENT SURGERY Left   . THROMBECTOMY Left 02/2002   fistula/notes 06/02/2010  . THROMBECTOMY / ARTERIOVENOUS GRAFT REVISION  11/2003; 01/2004; 08/07/2005; 08/10/2005; 10/2005   [name]  06/02/2010  . THROMBECTOMY AND REVISION OF ARTERIOVENTOUS (AV) GORETEX  GRAFT Right 01/2002; 11/2003; 01/2004; 08/07/2004; 08/10/2004; 11/13/2005   [name] 5/1/2012Marland Kitchen [name] 5/16/2012Marland Kitchen [name] 5/16/2012Marland Kitchen [name] 5/16/2012Marland Kitchen [name] 06/02/2010; [name] 06/02/2010  . THROMBECTOMY AND REVISION OF ARTERIOVENTOUS (AV) GORETEX  GRAFT Left 08/23/2002; 09/13/2002; 07/08/2003   [name] 06/02/2010; [name] 06/02/2010; [name] 06/02/2010    There were no vitals filed for this visit.  Subjective Assessment - 07/24/17 1538     Subjective   My fingers are tingly and numb - it is irritating more than painful    Pertinent History  s/p C5/6/7 anterior corpectomy (with strut graft and anterior plating) and posterior C3-T1 arthrodesis on 05/24/17.  Pt with PMH that includes:  Arthritis, ESRD, s/p kidney transplant 01/21/17, HTN, Hypotyroidism, heart murmur, anxiety, history of C3-C6 posterior decompression 2018    Limitations  Pt reports that she called MD office regarding restrictions and reports that she may begin low-range strengthening, ok to perform overhead reaching, no lifting lb limit, cervical brace for comfort    Patient Stated Goals  improve coordination/strength UEs, return to work                   OT Treatments/Exercises (OP) - 07/24/17 0001      ADLs   Writing  assessed writing -pt able to write at 2 sentence level with 100% legibility.  Pt reports that it aggravates the sensory issues when she slides hand across the paper but she has limited writing she has to do.      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 Will further address this as hand function improves.     ADL Comments  Assessed status of STG's - see goal section for details. Pt has made progress in all areas and has met or partially met all STG's.  Pt reports that functionally she has improved with coordination and grip strength and is doing laundry and some cleaning at home.      Hand Exercises for Cervical Radiculopathy   Gross Grasp  Gripper on #1 for stacking blocks (stacks of 4) to address sustained grip and using vision to compensate for sensory loss. Pt alternated hands to complete.               OT Short Term Goals - 07/24/17 1555      OT SHORT TERM GOAL #1   Title  Pt will be independent with initial HEP.--check STGs 07/22/17    Time  4    Period  Weeks    Status  Achieved      OT SHORT TERM GOAL #2   Title  Pt will improve coordination for  ADLs as shown by completing 9-hole peg test in less than 22sec with R hand and 24sec with L hand.     Baseline  R-24.44, L-29.47sec    Time  4    Period  Weeks    Status  Partially Met 07/24/2017  R= 22.68, L= 21.46      OT SHORT TERM GOAL #3   Title  Pt will be able to perform overhead reaching to retrieve light object without compensation.    Baseline  75% with compensation    Time  4    Period  Weeks    Status  Achieved      OT SHORT TERM GOAL #4   Title  Pt will improve bilateral grip strength  by at least 5lbs to assist with ADLs (opening containers, gripping).    Baseline  R-35lbs, L-32lbs    Time  4    Period  Weeks    Status  On-going 07/24/2017  R = 38, L= 35 met for L hand      OT SHORT TERM GOAL #5   Title  Pt will improve lateral pinch strength by at least 4lbs bilaterally for work tasks/ADLs.    Baseline  R-7lbs, L-6lbs    Time  4    Period  Weeks    Status  Achieved 07/24/2017 L lateral = 8, 3 pt = 9; R lateral = 9, 3 pt = 8        OT Long Term Goals - 07/24/17 1610      OT LONG TERM GOAL #1   Title  Pt will be independent with updated HEP.--check LTGs 08/22/17    Time  12    Period  Weeks    Status  On-going      OT LONG TERM GOAL #2   Title  Pt will be able to retrieve at least 2lb object from overhead shelf with each UE safely.    Time  12    Period  Weeks    Status  On-going      OT LONG TERM GOAL #3   Title  Pt will perform simulated fine motor work tasks (typing, writing, preparing slides, etc) with min difficulty/sufficient for return to work.    Time  12    Period  Weeks    Status  On-going      OT LONG TERM GOAL #4   Title  Pt will improve 3point pinch strength by at least 4lbs bilaterally for work tasks/ADLs.    Baseline  R-7bs, L-6lbs    Time  12    Period  Weeks    Status  On-going      OT LONG TERM GOAL #5   Title  Pt will improve bilateral grip strength by at least 10lbs to assist with ADLs (opening containers, gripping).    Baseline   R-35lbs, L-32lbs    Time  12    Period  Weeks    Status  On-going      OT LONG TERM GOAL #6   Title  Pt will be able to perform mod complex IADLs (vacuuming, carrying heavier groceries, etc).  mod I.    Time  12    Period  Weeks    Status  On-going            Plan - 07/24/17 1629    Clinical Impression Statement  Pt progressing toward goals and demonstrates improved grip, pinch and coordination as well as reaching.     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      Occupational performance deficits (Please refer to evaluation for details):  ADL's;IADL's;Work;Leisure    Rehab Potential  Good    OT Frequency  2x / week    OT Duration  12 weeks    OT Treatment/Interventions  Self-care/ADL training;Therapeutic exercise;Moist Heat;Electrical Stimulation;Fluidtherapy;Paraffin;Neuromuscular education;Splinting;Patient/family education;Energy conservation;Therapist, nutritional;Therapeutic activities;Cryotherapy;Ultrasound;DME and/or AE instruction;Manual Therapy;Passive range of motion;Balance training    Plan  check HEP and add to prn, maybe increase putty to green?, grip and pinch strength, typing  Consulted and Agree with Plan of Care  Patient       Patient will benefit from skilled therapeutic intervention in order to improve the following deficits and impairments:  Decreased knowledge of use of DME, Pain, Decreased coordination, Decreased mobility, Impaired sensation, Decreased strength, Decreased range of motion, Decreased endurance, Decreased activity tolerance, Decreased balance, Decreased knowledge of precautions, Impaired perceived functional ability, Impaired UE functional use  Visit Diagnosis: Muscle weakness (generalized)  Other lack of  coordination  Unsteadiness on feet    Problem List Patient Active Problem List   Diagnosis Date Noted  . 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 S/P spinal surgery 05/24/2017  . Other secondary kyphosis, cervical region 05/24/2017  . Anxiety 11/21/2016  . Stenosis of cervical spine with myelopathy ([place]) 08/12/2016  . Routine general medical examination at a health care facility 06/01/2016  . Numbness and tingling in both hands 05/18/2015  . Hypothyroidism 03/24/2015  . History of tachycardia 03/24/2015  . HTN (hypertension), benign 06/12/2013  . ESRD on dialysis [facility]) 06/12/2013    Quay Burow, OTR/L 07/24/2017, 4:33 PM  [facility] [address] [place], [place], [address] Phone: [phone]   Fax:  [phone]  Name: COURTNY BENNISON MRN: [id number] Date of Birth: 01-Jun-1960

## 2017-07-25 DIAGNOSIS — M4802 Spinal stenosis, cervical region: Secondary | ICD-10-CM | POA: Diagnosis not present

## 2017-07-25 DIAGNOSIS — R030 Elevated blood-pressure reading, without diagnosis of hypertension: Secondary | ICD-10-CM | POA: Diagnosis not present

## 2017-07-26 ENCOUNTER — Ambulatory Visit: Payer: Medicare Other | Admitting: Physical Therapy

## 2017-07-26 ENCOUNTER — Encounter: Payer: Medicare Other | Admitting: Occupational Therapy

## 2017-07-27 ENCOUNTER — Encounter: Payer: Self-pay | Admitting: Rehabilitation

## 2017-07-27 ENCOUNTER — Encounter: Payer: Self-pay | Admitting: Occupational Therapy

## 2017-07-27 ENCOUNTER — Ambulatory Visit: Payer: Medicare Other | Admitting: Occupational Therapy

## 2017-07-27 ENCOUNTER — Ambulatory Visit: Payer: Medicare Other | Admitting: Rehabilitation

## 2017-07-27 DIAGNOSIS — M542 Cervicalgia: Secondary | ICD-10-CM

## 2017-07-27 DIAGNOSIS — R278 Other lack of coordination: Secondary | ICD-10-CM

## 2017-07-27 DIAGNOSIS — M6281 Muscle weakness (generalized): Secondary | ICD-10-CM

## 2017-07-27 DIAGNOSIS — R2681 Unsteadiness on feet: Secondary | ICD-10-CM

## 2017-07-27 DIAGNOSIS — R2689 Other abnormalities of gait and mobility: Secondary | ICD-10-CM | POA: Diagnosis not present

## 2017-07-27 NOTE — Therapy (Signed)
[facility] [address] [place], [place], [address] Phone: [phone]   Fax:  [phone]  Physical Therapy Treatment  Patient Details  Name: [name] MRN: [id number] Date of Birth: Mar 26, 1960 Referring Provider: Dr. Ashok Pall   Encounter Date: 07/27/2017  PT End of Session - 07/27/17 2012    Visit Number  11    Number of Visits  17    Date for PT Re-Evaluation  08/21/17    Authorization Type  Medicare    PT Start Time  8315    PT Stop Time  1400    PT Time Calculation (min)  43 min    Equipment Utilized During Treatment  Gait belt    Activity Tolerance  Patient tolerated treatment well    Behavior During Therapy  [facility] for tasks assessed/performed       Past Medical History:  Diagnosis Date  . Anemia   . Anxiety   . Arthritis   . Claustrophobia   . ESRD (end stage renal disease) on dialysis ([facility])    "TTS; [place]" (03/24/2015)  . Glomerulonephritis   . Heart murmur   . HTN (hypertension)   . Hypothyroidism   . Kidney transplant recipient 01/21/2017  . Pancreatitis   . Renal insufficiency   . Secondary hyperparathyroidism ([place])    [name] 05/11/2014    Past Surgical History:  Procedure Laterality Date  . ANTERIOR CERVICAL CORPECTOMY N/A 05/24/2017   Procedure: CORPECTOMY CERVICAL FIVE- CERVICAL SIX;  Surgeon: Ashok Pall, MD;  Location: [facility];  Service: Neurosurgery;  Laterality: N/A;  . ARTERIOVENOUS GRAFT PLACEMENT Right 1990's?  . ARTERIOVENOUS GRAFT PLACEMENT Left 07/2002   upper arm/notes 06/02/2010  . ARTERIOVENOUS GRAFT PLACEMENT Right 07/2003   upper arm/notes 06/02/2010  . AV FISTULA PLACEMENT Left 09/2000   upper arm/notes 06/02/2010  . BREAST BIOPSY Left unsure   benign  . COLONOSCOPY    . DILATATION & CURRETTAGE/HYSTEROSCOPY WITH RESECTOCOPE N/A 04/17/2013   Procedure: DILATATION & CURETTAGE/HYSTEROSCOPY WITH RESECTOCOPE;  Surgeon: Marvene Staff, MD;  Location: [facility];  Service: Gynecology;  Laterality: N/A;  . DILATION AND CURETTAGE OF UTERUS    . EYE SURGERY    . KIDNEY TRANSPLANT  1997  . KIDNEY TRANSPLANT  01/21/2017   Dr. Harrington Challenger  . PARATHYROIDECTOMY  03/2000 05/12/2014   w/neck exploration & autotransplantation/notes 06/02/2010; w/neck exploration  . PARATHYROIDECTOMY N/A 05/12/2014   Procedure: PARATHYROIDECTOMY AND NECK EXPLORATION;  Surgeon: Armandina Gemma, MD;  Location: [facility];  Service: General;  Laterality: N/A;  . PERITONEAL CATHETER INSERTION    . PERITONEAL CATHETER REMOVAL  08/1999   [name] 06/02/2010  . POSTERIOR CERVICAL FUSION/FORAMINOTOMY N/A 05/30/2017   Procedure: POSTERIOR CERVICAL Arthrodesis Cervical three - four, cervical four - five, cervical five - six, cervical six - seven;  Surgeon: Ashok Pall, MD;  Location: [facility];  Service: Neurosurgery;  Laterality: N/A;  . POSTERIOR CERVICAL LAMINECTOMY N/A 08/12/2016   Procedure: POSTERIOR CERVICAL LAMINECTOMY CERVICAL THREE CERVICAL FOUR, CERVICAL FOUR CERVICAL FIVE, CERVICAL FIVE- CERVICAL SIX, CERVICAL SIX- CERVICAL SEVEN;  Surgeon: Ashok Pall, MD;  Location: [facility];  Service: Neurosurgery;  Laterality: N/A;  POSTERIOR  . RETINAL DETACHMENT SURGERY Left   . THROMBECTOMY Left 02/2002   fistula/notes 06/02/2010  . THROMBECTOMY / ARTERIOVENOUS GRAFT REVISION  11/2003; 01/2004; 08/07/2005; 08/10/2005; 10/2005   [name] 06/02/2010  . THROMBECTOMY AND REVISION OF ARTERIOVENTOUS (AV) GORETEX  GRAFT Right 01/2002; 11/2003; 01/2004; 08/07/2004; 08/10/2004; 11/13/2005   /  notes 05/18/2010; [name] 5/16/2012Marland Kitchen [name] 5/16/2012Marland Kitchen [name] 5/16/2012Marland Kitchen [name] 5/16/2012Marland Kitchen [name] 06/02/2010  . THROMBECTOMY AND REVISION OF ARTERIOVENTOUS (AV) GORETEX  GRAFT Left 08/23/2002; 09/13/2002; 07/08/2003   [name] 06/02/2010; [name] 06/02/2010; [name] 06/02/2010    There were no vitals filed for this visit.  Subjective Assessment - 07/27/17 1321    Subjective  Pt reports seeing Dr. Christella Noa and he reports she can go without cervical  collar now.  Also reports cervical flex/ext are likely at their best, but rotation can still increase.      Pertinent History  s/p kidney transplant 01/21/17    Limitations  House hold activities;Walking    How long can you walk comfortably?  Is able to ambulate approx 5-15 mins before she notices R foot dragging.     Patient Stated Goals  "I want to be able to walk without feeling like I"m getting ready to fall."    Currently in Pain?  No/denies                       [facility] Adult PT Treatment/Exercise - 07/27/17 1320      Neuro Re-ed    Neuro Re-ed Details   High level balance working on compliant surfaces, stepping strategy and more dynamic gait/balance to carryover to decrease fall risk; in // bars on inverted BOSU maintaining balance x 20 secs (2 reps), alternating UE flex/extension x 10 reps with intermittent min/guard as needed from PT or use of rail, standing on BOSU with single LE in stance advancing from floor to target x 5 reps on each side progressing to standing with BLE on BOSU tapping to cones alternating LEs x 10 reps with intermittent UE support.  Ambulation with head turns up/down x 50', side to side x 50', diagonally x 50' each direction.  ended with kicking bean bag x 200' intermittently switching LEs for modfiied SLS position.  Pt able to do at min/guard level.        Neck Exercises: Seated   Other Seated Exercise  Self first rib mobilization with use of sheet x 5 reps with 10 sec holds.  Added to HEP      Manual Therapy   Manual Therapy  Soft tissue mobilization;Myofascial release    Manual therapy comments  All manual therapy performed to decreased tightness and improve cervical ROM.      Soft tissue mobilization  to bil upper traps, scalenes, levators and cervical paraspinals.             Myofascial Release  to L levator, first rib mob    Manual Traction  Gentle manual traction in between trigger point release             PT Education - 07/27/17 2011     Education Details  Showed video of dry needling to patient and educated on how this would assist with trigger points.  Pt verbalized understanding and agreed to get scheduled at [facility].     Person(s) Educated  Patient    Methods  Explanation    Comprehension  Verbalized understanding       PT Short Term Goals - 07/18/17 1539      PT SHORT TERM GOAL #1   Title  Pt will be independent with initial HEP in order to indicate improved functional mobility and decreased fall risk.  (Target Date: 07/22/17)    Baseline  07/18/17: per pt report    Time  --    Period  --  Status  Achieved      PT SHORT TERM GOAL #2   Title  Pt will improve 5TSS to </=16.50 secs without UE support in order to indicate decreased fall risk and improved functional strength.      Baseline  10.97 secs without UE support    Time  4    Period  Weeks    Status  Achieved      PT SHORT TERM GOAL #3   Title  Pt will improve FGA to 19/30 in order to indicate decreased fall risk.      Baseline  23/30 on 07/17/17    Time  4    Period  Weeks    Status  Achieved      PT SHORT TERM GOAL #4   Title  Pt will ambulate at gait speed of >/=2.62 ft/sec with decreased indication of R foot slap/overt gait deviations indicating safe gait speed.     Time  4    Period  Weeks    Status  New      PT SHORT TERM GOAL #5   Title  Pt will ambulate outdoors over unlevel paved surfaces up to 1000' at mod I level in order to indicate improved community independence.     Baseline  07/18/17, goal met.    Time  4    Period  Weeks    Status  Achieved        PT Long Term Goals - 07/24/17 2059      PT LONG TERM GOAL #1   Title  Pt will be independent with final HEP in order to indicate improved functional mobility and decreased fall risk.  (Target Date: 08/21/17)    Time  8    Period  Weeks    Status  New      PT LONG TERM GOAL #2   Title  Pt will improve 5TSS to </=13.50 secs without UE support in order to indicate improved  functional strength and decreased fall risk.     Baseline  10.97 secs without UE support on 07/17/17    Time  8    Period  Weeks    Status  Achieved      PT LONG TERM GOAL #3   Title  Pt will improve FGA to >/=25/30 in order to indicate decreased fall risk.      Time  8    Period  Weeks    Status  Revised      PT LONG TERM GOAL #4   Title  Pt will ambulate >1000' over varying outdoor surfaces, scanning environment at independent level in order to indicate safe community and leisure mobility.     Time  8    Period  Weeks    Status  New      PT LONG TERM GOAL #5   Title  Pt will improve cervical rotation by 10 deg bilaterally in order to indicate improved safety with driving (when cleared to be without cervical collar).      Time  8    Period  Weeks    Status  New            Plan - 07/27/17 2013    Clinical Impression Statement  Skilled session focused on manual therapy to cervical and levator region to decrease tightness and improve ROM.  Also continue to focus on high level gait and balance tasks to emphasize SLS, stepping strategy and multi sensory balance challenges.  Rehab Potential  Good    Clinical Impairments Affecting Rehab Potential  severity of cord compression     PT Frequency  2x / week    PT Duration  8 weeks    PT Treatment/Interventions  ADLs/Self Care Home Management;Electrical Stimulation;Gait training;Stair training;Functional mobility training;Therapeutic activities;Therapeutic exercise;Balance training;Neuromuscular re-education;Patient/family education;Orthotic Fit/Training;Passive range of motion;Manual techniques    PT Next Visit Plan  finish STGs, Continue to address cervical tightness, all therapy without cervical collar, gentle neck ROM, continue to address balance on compliant surfaces with EC and dynamic gait.    Consulted and Agree with Plan of Care  Patient       Patient will benefit from skilled therapeutic intervention in order to improve  the following deficits and impairments:  Abnormal gait, Decreased activity tolerance, Decreased balance, Decreased endurance, Decreased mobility, Decreased range of motion, Decreased strength, Hypomobility, Impaired perceived functional ability, Impaired flexibility, Postural dysfunction, Improper body mechanics, Impaired sensation  Visit Diagnosis: Muscle weakness (generalized)  Unsteadiness on feet  Other abnormalities of gait and mobility  Cervicalgia     Problem List Patient Active Problem List   Diagnosis Date Noted  . 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 S/P spinal surgery 05/24/2017  . Other secondary kyphosis, cervical region 05/24/2017  . Anxiety 11/21/2016  . Stenosis of cervical spine with myelopathy ([place]) 08/12/2016  . Routine general medical examination at a health care facility 06/01/2016  . Numbness and tingling in both hands 05/18/2015  . Hypothyroidism 03/24/2015  . History of tachycardia 03/24/2015  . HTN (hypertension), benign 06/12/2013  . ESRD on dialysis [facility]) 06/12/2013    Cameron Sprang, PT, MPT [facility] [address] [place], [place], [address] Phone: [phone]   Fax:  [phone] 07/27/17, 8:16 PM  Name: [name] MRN: [id number] Date of Birth: [birth year]

## 2017-07-27 NOTE — Patient Instructions (Signed)
   First Rib Self Mobilization  Place a bedsheet or strap over the curve of the neck that is painful. Sit on the long end of the bedsheet with your opposite hip so that the sheet crosses over your back. Make sure your shoulders are relaxed. Pull down on the front of the sheet until a good tension is felt on rib (see picture). Exhale and hold that tension with your hands. Take a deep breath in without allowing the sheet to rise with the breath and exhale. Repeat.  Hold for 10-15 secs.  Repeat x 5 reps.  Twice per day.

## 2017-07-27 NOTE — Therapy (Signed)
[facility] [address] [place], [place], [address] Phone: [phone]   Fax:  [phone]  Occupational Therapy Treatment  Patient Details  Name: [name] MRN: [id number] Date of Birth: Dec 28, 1960 Referring Provider: Dr. Ashok Pall   Encounter Date: 07/27/2017  OT End of Session - 07/27/17 1416    Visit Number  9    Number of Visits  17    Date for OT Re-Evaluation  09/20/17    Authorization Type  Medicare $3000 visit limt will need PN every 10th visit    Authorization Time Period  40 day cert. period  (through 09/20/17)    Authorization - Visit Number  9    Authorization - Number of Visits  10    OT Start Time  1408    OT Stop Time  1448    OT Time Calculation (min)  40 min    Activity Tolerance  Patient tolerated treatment well       Past Medical History:  Diagnosis Date  . Anemia   . Anxiety   . Arthritis   . Claustrophobia   . ESRD (end stage renal disease) on dialysis ([facility])    "TTS; [employer]" (03/24/2015)  . Glomerulonephritis   . Heart murmur   . HTN (hypertension)   . Hypothyroidism   . Kidney transplant recipient 01/21/2017  . Pancreatitis   . Renal insufficiency   . Secondary hyperparathyroidism ([facility])    [name] 05/11/2014    Past Surgical History:  Procedure Laterality Date  . ANTERIOR CERVICAL CORPECTOMY N/A 05/24/2017   Procedure: CORPECTOMY CERVICAL FIVE- CERVICAL SIX;  Surgeon: Ashok Pall, MD;  Location: [facility];  Service: Neurosurgery;  Laterality: N/A;  . ARTERIOVENOUS GRAFT PLACEMENT Right 1990's?  . ARTERIOVENOUS GRAFT PLACEMENT Left 07/2002   upper arm/notes 06/02/2010  . ARTERIOVENOUS GRAFT PLACEMENT Right 07/2003   upper arm/notes 06/02/2010  . AV FISTULA PLACEMENT Left 09/2000   upper arm/notes 06/02/2010  . BREAST BIOPSY Left unsure   benign  . COLONOSCOPY    . DILATATION & CURRETTAGE/HYSTEROSCOPY WITH RESECTOCOPE N/A 04/17/2013   Procedure: DILATATION &  CURETTAGE/HYSTEROSCOPY WITH RESECTOCOPE;  Surgeon: Marvene Staff, MD;  Location: [facility];  Service: Gynecology;  Laterality: N/A;  . DILATION AND CURETTAGE OF UTERUS    . EYE SURGERY    . KIDNEY TRANSPLANT  1997  . KIDNEY TRANSPLANT  01/21/2017   Dr. Harrington Challenger  . PARATHYROIDECTOMY  03/2000 05/12/2014   w/neck exploration & autotransplantation/notes 06/02/2010; w/neck exploration  . PARATHYROIDECTOMY N/A 05/12/2014   Procedure: PARATHYROIDECTOMY AND NECK EXPLORATION;  Surgeon: Armandina Gemma, MD;  Location: [facility];  Service: General;  Laterality: N/A;  . PERITONEAL CATHETER INSERTION    . PERITONEAL CATHETER REMOVAL  08/1999   [name] 06/02/2010  . POSTERIOR CERVICAL FUSION/FORAMINOTOMY N/A 05/30/2017   Procedure: POSTERIOR CERVICAL Arthrodesis Cervical three - four, cervical four - five, cervical five - six, cervical six - seven;  Surgeon: Ashok Pall, MD;  Location: [facility];  Service: Neurosurgery;  Laterality: N/A;  . POSTERIOR CERVICAL LAMINECTOMY N/A 08/12/2016   Procedure: POSTERIOR CERVICAL LAMINECTOMY CERVICAL THREE CERVICAL FOUR, CERVICAL FOUR CERVICAL FIVE, CERVICAL FIVE- CERVICAL SIX, CERVICAL SIX- CERVICAL SEVEN;  Surgeon: Ashok Pall, MD;  Location: [facility];  Service: Neurosurgery;  Laterality: N/A;  POSTERIOR  . RETINAL DETACHMENT SURGERY Left   . THROMBECTOMY Left 02/2002   fistula/notes 06/02/2010  . THROMBECTOMY / ARTERIOVENOUS GRAFT REVISION  11/2003; 01/2004; 08/07/2005; 08/10/2005; 10/2005   [name]  06/02/2010  . THROMBECTOMY AND REVISION OF ARTERIOVENTOUS (AV) GORETEX  GRAFT Right 01/2002; 11/2003; 01/2004; 08/07/2004; 08/10/2004; 11/13/2005   [name] 5/1/2012Marland Kitchen [name] 5/16/2012Marland Kitchen [name] 5/16/2012Marland Kitchen [name] 5/16/2012Marland Kitchen [name] 06/02/2010; [name] 06/02/2010  . THROMBECTOMY AND REVISION OF ARTERIOVENTOUS (AV) GORETEX  GRAFT Left 08/23/2002; 09/13/2002; 07/08/2003   [name] 06/02/2010; [name] 06/02/2010; [name] 06/02/2010    There were no vitals filed for this visit.  Subjective Assessment - 07/27/17 1415     Subjective   just tightness.   Pt reports that she can use manual can opener now, but its slow.  Pt able to clip 1 nail that broke    Pertinent History  s/p C5/6/7 anterior corpectomy (with strut graft and anterior plating) and posterior C3-T1 arthrodesis on 05/24/17.  Pt with PMH that includes:  Arthritis, ESRD, s/p kidney transplant 01/21/17, HTN, Hypotyroidism, heart murmur, anxiety, history of C3-C6 posterior decompression 2018    Limitations  Pt reports that she called MD office regarding restrictions and reports that she may begin low-range strengthening, ok to perform overhead reaching, no lifting lb limit, cervical brace for comfort    Patient Stated Goals  improve coordination/strength UEs, return to work    Currently in Pain?  No/denies       Placing O'connor pegs in pegboard with tweezers with each hand with min-mod difficulty.    Picking up blocks with gripper set on level 1 (black spring) for sustained grip strength with min-mod difficulty with each hand to stack blocks in stacks of 4.  Functional reaching to place/remove 1-8lb resistance clothespins on vertical pole for incr pinch strength with each UE.  In quadruped, UE lifts in forward flex and then in extension with scapular retraction with min-mod cueing, min facilitation for incr scapular/core stability.                         OT Short Term Goals - 07/24/17 1555      OT SHORT TERM GOAL #1   Title  Pt will be independent with initial HEP.--check STGs 07/22/17    Time  4    Period  Weeks    Status  Achieved      OT SHORT TERM GOAL #2   Title  Pt will improve coordination for ADLs as shown by completing 9-hole peg test in less than 22sec with R hand and 24sec with L hand.     Baseline  R-24.44, L-29.47sec    Time  4    Period  Weeks    Status  Partially Met 07/24/2017  R= 22.68, L= 21.46      OT SHORT TERM GOAL #3   Title  Pt will be able to perform overhead reaching to retrieve light object without  compensation.    Baseline  75% with compensation    Time  4    Period  Weeks    Status  Achieved      OT SHORT TERM GOAL #4   Title  Pt will improve bilateral grip strength by at least 5lbs to assist with ADLs (opening containers, gripping).    Baseline  R-35lbs, L-32lbs    Time  4    Period  Weeks    Status  On-going 07/24/2017  R = 38, L= 35 met for L hand      OT SHORT TERM GOAL #5   Title  Pt will improve lateral pinch strength by at least 4lbs bilaterally for work tasks/ADLs.    Baseline  R-7lbs,  L-6lbs    Time  4    Period  Weeks    Status  Achieved 07/24/2017 L lateral = 8, 3 pt = 9; R lateral = 9, 3 pt = 8        OT Long Term Goals - 07/27/17 1425      OT LONG TERM GOAL #1   Title  Pt will be independent with updated HEP.--check LTGs 08/22/17    Time  12    Period  Weeks    Status  On-going      OT LONG TERM GOAL #2   Title  Pt will be able to retrieve at least 2lb object from overhead shelf with each UE safely.    Time  12    Period  Weeks    Status  On-going      OT LONG TERM GOAL #3   Title  Pt will perform simulated fine motor work tasks (typing, writing, preparing slides, etc) with min difficulty/sufficient for return to work.    Time  12    Period  Weeks    Status  On-going      OT LONG TERM GOAL #4   Title  Pt will improve 3point pinch strength by at least 4lbs bilaterally for work tasks/ADLs.    Baseline  R-7bs, L-6lbs    Time  12    Period  Weeks    Status  On-going      OT LONG TERM GOAL #5   Title  Pt will improve bilateral grip strength by at least 10lbs to assist with ADLs (opening containers, gripping).    Baseline  R-35lbs, L-32lbs    Time  12    Period  Weeks    Status  On-going      OT LONG TERM GOAL #6   Title  Pt will be able to perform mod complex IADLs (vacuuming, carrying heavier groceries, etc).  mod I.    Time  12    Period  Weeks    Status  On-going            Plan - 07/27/17 1420    Clinical Impression Statement  Pt is  progressing towards goals with improving strength and coordination and improved UE functional use.    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      Occupational performance deficits (Please refer to evaluation for details):  ADL's;IADL's;Work;Leisure    Rehab Potential  Good    OT Frequency  2x / week    OT Duration  12 weeks    OT Treatment/Interventions  Self-care/ADL training;Therapeutic exercise;Moist Heat;Electrical Stimulation;Fluidtherapy;Paraffin;Neuromuscular education;Splinting;Patient/family education;Energy conservation;Therapist, nutritional;Therapeutic activities;Cryotherapy;Ultrasound;DME and/or AE instruction;Manual Therapy;Passive range of motion;Balance training    Plan  increase putty to green?/update band HEP    Consulted and Agree with Plan of Care  Patient       Patient will benefit from skilled therapeutic intervention in order to improve the following deficits and impairments:  Decreased knowledge of use of DME, Pain, Decreased coordination, Decreased mobility, Impaired sensation, Decreased strength, Decreased range of motion, Decreased endurance, Decreased activity tolerance, Decreased balance, Decreased knowledge of precautions, Impaired perceived functional ability, Impaired UE functional use  Visit Diagnosis: Muscle weakness (generalized)  Other lack of coordination  Unsteadiness on feet  Other abnormalities of gait  and mobility    Problem List Patient Active Problem List   Diagnosis Date Noted  . 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 S/P spinal surgery 05/24/2017  . Other secondary kyphosis, cervical region 05/24/2017  . Anxiety 11/21/2016  . Stenosis of cervical spine with myelopathy ([facility]) 08/12/2016  . Routine general medical examination at  a health care facility 06/01/2016  . Numbness and tingling in both hands 05/18/2015  . Hypothyroidism 03/24/2015  . History of tachycardia 03/24/2015  . HTN (hypertension), benign 06/12/2013  . ESRD on dialysis [facility]) 06/12/2013    [facility] 07/27/2017, 2:34 PM  [facility] [address] [place], [place], [address] Phone: [phone]   Fax:  [phone]  Name: [name] MRN: [id number] Date of Birth: 05/11/1960   Vianne Bulls, OTR/L [facility] [address] [place], [place]  [address] [phone] phone [phone] 07/27/17 2:34 PM

## 2017-07-28 DIAGNOSIS — Z4822 Encounter for aftercare following kidney transplant: Secondary | ICD-10-CM | POA: Diagnosis not present

## 2017-07-28 DIAGNOSIS — I10 Essential (primary) hypertension: Secondary | ICD-10-CM | POA: Diagnosis not present

## 2017-07-28 DIAGNOSIS — Z7952 Long term (current) use of systemic steroids: Secondary | ICD-10-CM | POA: Diagnosis not present

## 2017-07-28 DIAGNOSIS — Z792 Long term (current) use of antibiotics: Secondary | ICD-10-CM | POA: Diagnosis not present

## 2017-07-28 DIAGNOSIS — Z79899 Other long term (current) drug therapy: Secondary | ICD-10-CM | POA: Diagnosis not present

## 2017-07-28 DIAGNOSIS — E872 Acidosis: Secondary | ICD-10-CM | POA: Diagnosis not present

## 2017-07-31 ENCOUNTER — Encounter: Payer: Medicare Other | Admitting: Occupational Therapy

## 2017-07-31 ENCOUNTER — Encounter: Payer: Self-pay | Admitting: Physical Therapy

## 2017-07-31 ENCOUNTER — Ambulatory Visit: Payer: Medicare Other | Admitting: Occupational Therapy

## 2017-07-31 ENCOUNTER — Encounter: Payer: Self-pay | Admitting: Occupational Therapy

## 2017-07-31 ENCOUNTER — Ambulatory Visit: Payer: Medicare Other | Admitting: Physical Therapy

## 2017-07-31 DIAGNOSIS — R278 Other lack of coordination: Secondary | ICD-10-CM

## 2017-07-31 DIAGNOSIS — R2689 Other abnormalities of gait and mobility: Secondary | ICD-10-CM

## 2017-07-31 DIAGNOSIS — R2681 Unsteadiness on feet: Secondary | ICD-10-CM | POA: Diagnosis not present

## 2017-07-31 DIAGNOSIS — M542 Cervicalgia: Secondary | ICD-10-CM | POA: Diagnosis not present

## 2017-07-31 DIAGNOSIS — M6281 Muscle weakness (generalized): Secondary | ICD-10-CM

## 2017-07-31 NOTE — Therapy (Signed)
[facility] [address] [place], [place], [address] Phone: [phone]   Fax:  [phone]  Occupational Therapy Treatment  Patient Details  Name: [name] MRN: [id number] Date of Birth: 05-May-1960 Referring Provider: Dr. Ashok Pall   Encounter Date: 07/31/2017  OT End of Session - 07/31/17 1643    Visit Number  10    Number of Visits  17    Date for OT Re-Evaluation  09/20/17    Authorization Type  Medicare $3000 visit limt will need PN every 10th visit    Authorization Time Period  27 day cert. period  (through 09/20/17)    Authorization - Visit Number  10    Authorization - Number of Visits  10    OT Start Time  0932    OT Stop Time  1530    OT Time Calculation (min)  45 min    Activity Tolerance  Patient tolerated treatment well    Behavior During Therapy  WFL for tasks assessed/performed       Past Medical History:  Diagnosis Date  . Anemia   . Anxiety   . Arthritis   . Claustrophobia   . ESRD (end stage renal disease) on dialysis ([facility])    "TTS; [facility]" (03/24/2015)  . Glomerulonephritis   . Heart murmur   . HTN (hypertension)   . Hypothyroidism   . Kidney transplant recipient 01/21/2017  . Pancreatitis   . Renal insufficiency   . Secondary hyperparathyroidism ([facility])    [name] 05/11/2014    Past Surgical History:  Procedure Laterality Date  . ANTERIOR CERVICAL CORPECTOMY N/A 05/24/2017   Procedure: CORPECTOMY CERVICAL FIVE- CERVICAL SIX;  Surgeon: Ashok Pall, MD;  Location: [facility];  Service: Neurosurgery;  Laterality: N/A;  . ARTERIOVENOUS GRAFT PLACEMENT Right 1990's?  . ARTERIOVENOUS GRAFT PLACEMENT Left 07/2002   upper arm/notes 06/02/2010  . ARTERIOVENOUS GRAFT PLACEMENT Right 07/2003   upper arm/notes 06/02/2010  . AV FISTULA PLACEMENT Left 09/2000   upper arm/notes 06/02/2010  . BREAST BIOPSY Left unsure   benign  . COLONOSCOPY    . DILATATION & CURRETTAGE/HYSTEROSCOPY  WITH RESECTOCOPE N/A 04/17/2013   Procedure: DILATATION & CURETTAGE/HYSTEROSCOPY WITH RESECTOCOPE;  Surgeon: Marvene Staff, MD;  Location: [facility];  Service: Gynecology;  Laterality: N/A;  . DILATION AND CURETTAGE OF UTERUS    . EYE SURGERY    . KIDNEY TRANSPLANT  1997  . KIDNEY TRANSPLANT  01/21/2017   Dr. Harrington Challenger  . PARATHYROIDECTOMY  03/2000 05/12/2014   w/neck exploration & autotransplantation/notes 06/02/2010; w/neck exploration  . PARATHYROIDECTOMY N/A 05/12/2014   Procedure: PARATHYROIDECTOMY AND NECK EXPLORATION;  Surgeon: Armandina Gemma, MD;  Location: [facility];  Service: General;  Laterality: N/A;  . PERITONEAL CATHETER INSERTION    . PERITONEAL CATHETER REMOVAL  08/1999   [name] 06/02/2010  . POSTERIOR CERVICAL FUSION/FORAMINOTOMY N/A 05/30/2017   Procedure: POSTERIOR CERVICAL Arthrodesis Cervical three - four, cervical four - five, cervical five - six, cervical six - seven;  Surgeon: Ashok Pall, MD;  Location: [facility];  Service: Neurosurgery;  Laterality: N/A;  . POSTERIOR CERVICAL LAMINECTOMY N/A 08/12/2016   Procedure: POSTERIOR CERVICAL LAMINECTOMY CERVICAL THREE CERVICAL FOUR, CERVICAL FOUR CERVICAL FIVE, CERVICAL FIVE- CERVICAL SIX, CERVICAL SIX- CERVICAL SEVEN;  Surgeon: Ashok Pall, MD;  Location: [facility];  Service: Neurosurgery;  Laterality: N/A;  POSTERIOR  . RETINAL DETACHMENT SURGERY Left   . THROMBECTOMY Left 02/2002   fistula/notes 06/02/2010  . THROMBECTOMY / ARTERIOVENOUS  GRAFT REVISION  11/2003; 01/2004; 08/07/2005; 08/10/2005; 10/2005   [name] 06/02/2010  . THROMBECTOMY AND REVISION OF ARTERIOVENTOUS (AV) GORETEX  GRAFT Right 01/2002; 11/2003; 01/2004; 08/07/2004; 08/10/2004; 11/13/2005   [name] 5/1/2012Marland Kitchen [name] 5/16/2012Marland Kitchen [name] 5/16/2012Marland Kitchen [name] 5/16/2012Marland Kitchen [name] 06/02/2010; [name] 06/02/2010  . THROMBECTOMY AND REVISION OF ARTERIOVENTOUS (AV) GORETEX  GRAFT Left 08/23/2002; 09/13/2002; 07/08/2003   [name] 06/02/2010; [name] 06/02/2010; [name] 06/02/2010    There were no vitals filed for  this visit.  Subjective Assessment - 07/31/17 1628    Subjective   Patient indicates she has been consistently using red theraputty    Pertinent History  s/p C5/6/7 anterior corpectomy (with strut graft and anterior plating) and posterior C3-T1 arthrodesis on 05/24/17.  Pt with PMH that includes:  Arthritis, ESRD, s/p kidney transplant 01/21/17, HTN, Hypotyroidism, heart murmur, anxiety, history of C3-C6 posterior decompression 2018    Limitations  Pt reports that she called MD office regarding restrictions and reports that she may begin low-range strengthening, ok to perform overhead reaching, no lifting lb limit, cervical brace for comfort    Patient Stated Goals  improve coordination/strength UEs, return to work    Currently in Pain?  No/denies    Pain Score  0-No pain                   OT Treatments/Exercises (OP) - 07/31/17 1632      Shoulder Exercises: Supine   Theraband Level (Shoulder External Rotation)  --    External Rotation Limitations  --    Theraband Level (Shoulder ABduction)  --    ABduction Limitations  --      Shoulder Exercises: Seated   Theraband Level (Shoulder External Rotation)  Level 1 (Yellow)    External Rotation Limitations  10 reps with 2-3 sec hold at end range- controlled return    Theraband Level (Shoulder ABduction)  Level 1 (Yellow)    ABduction Limitations  10 reps with 2-3 sec hold, emphasis on controlled return      Elbow Exercises   Theraband Level (Elbow Extension)  Level 1 (Yellow)    Elbow Extension Limitations  10 reps R/L    Other elbow exercises  Tricep press from chair with arms for more isometric contraction to begin to lift off surface      Neurological Re-education Exercises   Other Exercises 1  Worked on in hand manipulation and hand/wrist/forearm coordiantion with grooved pegboard.  Patient needed occasional cueing to reduce shoulder hike or compesatory shoulder abduction with increased effort.               OT  Education - 07/31/17 1640    Education Details  Upgraded therapy putty to green (medium) and added to yellow theraband hep    Person(s) Educated  Patient    Methods  Explanation;Demonstration;Tactile cues;Verbal cues    Comprehension  Verbalized understanding;Returned demonstration;Verbal cues required       OT Short Term Goals - 07/31/17 1645      OT SHORT TERM GOAL #1   Title  Pt will be independent with initial HEP.--check STGs 07/22/17    Status  Achieved      OT SHORT TERM GOAL #2   Title  Pt will improve coordination for ADLs as shown by completing 9-hole peg test in less than 22sec with R hand and 24sec with L hand.     Baseline  R-24.44, L-29.47sec    Status  Partially Met      OT SHORT TERM GOAL #3  Title  Pt will be able to perform overhead reaching to retrieve light object without compensation.    Baseline  75% with compensation    Status  Achieved      OT SHORT TERM GOAL #4   Title  Pt will improve bilateral grip strength by at least 5lbs to assist with ADLs (opening containers, gripping).    Status  On-going      OT SHORT TERM GOAL #5   Title  Pt will improve lateral pinch strength by at least 4lbs bilaterally for work tasks/ADLs.    Status  Achieved        OT Long Term Goals - 07/31/17 1646      OT LONG TERM GOAL #1   Title  Pt will be independent with updated HEP.--check LTGs 08/22/17    Status  On-going      OT LONG TERM GOAL #2   Title  Pt will be able to retrieve at least 2lb object from overhead shelf with each UE safely.    Status  On-going      OT LONG TERM GOAL #3   Title  Pt will perform simulated fine motor work tasks (typing, writing, preparing slides, etc) with min difficulty/sufficient for return to work.    Status  On-going      OT LONG TERM GOAL #4   Title  Pt will improve 3point pinch strength by at least 4lbs bilaterally for work tasks/ADLs.    Status  -- 7/15:  9lbs right, 9 lbs left      OT LONG TERM GOAL #5   Title  Pt will improve  bilateral grip strength by at least 10lbs to assist with ADLs (opening containers, gripping).    Status  On-going            Plan - 07/31/17 1643    Clinical Impression Statement  Patient is showing steady progress toward OT goals. She demonstrates imporved grasp and pinch strength, and functional coordiantion / in hand manipulation skills.  Patient needs continue strengthening for proximal musculature.      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      Occupational performance deficits (Please refer to evaluation for details):  ADL's;IADL's;Work;Leisure    Rehab Potential  Good    OT Frequency  2x / week    OT Duration  12 weeks    OT Treatment/Interventions  Self-care/ADL training;Therapeutic exercise;Moist Heat;Electrical Stimulation;Fluidtherapy;Paraffin;Neuromuscular education;Splinting;Patient/family education;Energy conservation;Therapist, nutritional;Therapeutic activities;Cryotherapy;Ultrasound;DME and/or AE instruction;Manual Therapy;Passive range of motion;Balance training    Plan  Functional reach, improve proximal UE strength    Clinical Decision Making  Several treatment options, min-mod task modification necessary    OT Home Exercise Plan  Education provided:  green putty HEP, Coordination HEP, shoulder HEP    Consulted and Agree with Plan of Care  Patient       Patient will benefit from skilled therapeutic intervention in order to improve the following deficits and impairments:  Decreased knowledge of use of DME, Pain, Decreased coordination, Decreased mobility, Impaired sensation, Decreased strength, Decreased range of motion, Decreased endurance, Decreased activity tolerance, Decreased balance, Decreased knowledge of precautions, Impaired perceived functional  ability, Impaired UE functional use  Visit Diagnosis: Muscle weakness (generalized)  Other lack of coordination  Unsteadiness on feet    Problem List  Patient Active Problem List   Diagnosis Date Noted  . 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 S/P spinal surgery 05/24/2017  . Other secondary kyphosis, cervical region 05/24/2017  . Anxiety 11/21/2016  . Stenosis of cervical spine with myelopathy ([facility]) 08/12/2016  . Routine general medical examination at a health care facility 06/01/2016  . Numbness and tingling in both hands 05/18/2015  . Hypothyroidism 03/24/2015  . History of tachycardia 03/24/2015  . HTN (hypertension), benign 06/12/2013  . ESRD on dialysis [facility]) 06/12/2013  Occupational Therapy Progress Note  Dates of Reporting Period: 06/22/2017 to 07/31/2017    Reason Skilled Services are Required: Patient is showing steady improvement, although still lacks proximal UE strength, has limited functional balance, and fine motor skills are developing.    Mariah Milling, OTR/L 07/31/2017, 4:48 PM  [facility] [address] [place], [place], [address] Phone: [phone]   Fax:  [phone]  Name: [name] MRN: [id number] Date of Birth: 04-Jul-1960

## 2017-07-31 NOTE — Therapy (Signed)
[facility] [address] [place], [place], [address] Phone: [phone]   Fax:  [phone]  Physical Therapy Treatment  Patient Details  Name: [name] MRN: [id number] Date of Birth: 04-14-1960 Referring Provider: Dr. Ashok Pall   Encounter Date: 07/31/2017  PT End of Session - 07/31/17 1503    Visit Number  12    Number of Visits  17    Date for PT Re-Evaluation  08/21/17    Authorization Type  Medicare    PT Start Time  2725 pt arrived late    PT Stop Time  1445    PT Time Calculation (min)  42 min    Equipment Utilized During Treatment  Gait belt    Activity Tolerance  Patient tolerated treatment well    Behavior During Therapy  [facility] for tasks assessed/performed       Past Medical History:  Diagnosis Date  . Anemia   . Anxiety   . Arthritis   . Claustrophobia   . ESRD (end stage renal disease) on dialysis ([place])    "TTS; [place]" (03/24/2015)  . Glomerulonephritis   . Heart murmur   . HTN (hypertension)   . Hypothyroidism   . Kidney transplant recipient 01/21/2017  . Pancreatitis   . Renal insufficiency   . Secondary hyperparathyroidism ([place])    [name] 05/11/2014    Past Surgical History:  Procedure Laterality Date  . ANTERIOR CERVICAL CORPECTOMY N/A 05/24/2017   Procedure: CORPECTOMY CERVICAL FIVE- CERVICAL SIX;  Surgeon: Ashok Pall, MD;  Location: [facility];  Service: Neurosurgery;  Laterality: N/A;  . ARTERIOVENOUS GRAFT PLACEMENT Right 1990's?  . ARTERIOVENOUS GRAFT PLACEMENT Left 07/2002   upper arm/notes 06/02/2010  . ARTERIOVENOUS GRAFT PLACEMENT Right 07/2003   upper arm/notes 06/02/2010  . AV FISTULA PLACEMENT Left 09/2000   upper arm/notes 06/02/2010  . BREAST BIOPSY Left unsure   benign  . COLONOSCOPY    . DILATATION & CURRETTAGE/HYSTEROSCOPY WITH RESECTOCOPE N/A 04/17/2013   Procedure: DILATATION & CURETTAGE/HYSTEROSCOPY WITH RESECTOCOPE;  Surgeon: Marvene Staff,  MD;  Location: [facility];  Service: Gynecology;  Laterality: N/A;  . DILATION AND CURETTAGE OF UTERUS    . EYE SURGERY    . KIDNEY TRANSPLANT  1997  . KIDNEY TRANSPLANT  01/21/2017   Dr. Harrington Challenger  . PARATHYROIDECTOMY  03/2000 05/12/2014   w/neck exploration & autotransplantation/notes 06/02/2010; w/neck exploration  . PARATHYROIDECTOMY N/A 05/12/2014   Procedure: PARATHYROIDECTOMY AND NECK EXPLORATION;  Surgeon: Armandina Gemma, MD;  Location: [facility];  Service: General;  Laterality: N/A;  . PERITONEAL CATHETER INSERTION    . PERITONEAL CATHETER REMOVAL  08/1999   [name] 06/02/2010  . POSTERIOR CERVICAL FUSION/FORAMINOTOMY N/A 05/30/2017   Procedure: POSTERIOR CERVICAL Arthrodesis Cervical three - four, cervical four - five, cervical five - six, cervical six - seven;  Surgeon: Ashok Pall, MD;  Location: [facility];  Service: Neurosurgery;  Laterality: N/A;  . POSTERIOR CERVICAL LAMINECTOMY N/A 08/12/2016   Procedure: POSTERIOR CERVICAL LAMINECTOMY CERVICAL THREE CERVICAL FOUR, CERVICAL FOUR CERVICAL FIVE, CERVICAL FIVE- CERVICAL SIX, CERVICAL SIX- CERVICAL SEVEN;  Surgeon: Ashok Pall, MD;  Location: [facility];  Service: Neurosurgery;  Laterality: N/A;  POSTERIOR  . RETINAL DETACHMENT SURGERY Left   . THROMBECTOMY Left 02/2002   fistula/notes 06/02/2010  . THROMBECTOMY / ARTERIOVENOUS GRAFT REVISION  11/2003; 01/2004; 08/07/2005; 08/10/2005; 10/2005   [name] 06/02/2010  . THROMBECTOMY AND REVISION OF ARTERIOVENTOUS (AV) GORETEX  GRAFT Right 01/2002; 11/2003; 01/2004; 08/07/2004; 08/10/2004;  11/13/2005   [name] 5/1/2012[place] Kitchen [name] 5/16/2012Marland Kitchen [name] 5/16/2012Marland Kitchen [name] 5/16/2012Marland Kitchen [name] 5/16/2012Mar[place] Kitchen [name] 06/02/2010  . THROMBECTOMY AND REVISION OF ARTERIOVENTOUS (AV) GORETEX  GRAFT Left 08/23/2002; 09/13/2002; 07/08/2003   [name] 06/02/2010; [name] 06/02/2010; [name] 06/02/2010    There were no vitals filed for this visit.  Subjective Assessment - 07/31/17 1404    Subjective  Pt reports no pain but reports still reports cervical  tightness. Pt stated she did make an appointment for dry needling.    Pertinent History  s/p kidney transplant 01/21/17    Limitations  House hold activities;Walking    How long can you walk comfortably?  Is able to ambulate approx 5-15 mins before she notices R foot dragging.     Patient Stated Goals  "I want to be able to walk without feeling like I"m getting ready to fall."    Currently in Pain?  No/denies          [facility] Adult PT Treatment/Exercise - 07/31/17 1453      Ambulation/Gait   Ambulation/Gait  Yes    Gait velocity  3.53 ft/sec without AD      Neuro Re-ed    Neuro Re-ed Details   High level balance on inverted Bosu in // bars working on single leg stance by lightly tapping cone and then tapping behind. Requiring min guard to min assist with intermittent UE support to maintain balance x 5 on each LE.  Also instrusted on red balance beam with lightly tapping compliant surface to increase single leg stance and balance. requiring min assist and occasional UE support on bars x 5 reps on each LE.      Neck Exercises: Theraband   Scapula Retraction  10 reps yellow tband; vc on posture & keeping elbows to side    Shoulder Extension  10 reps yellow tand; verbal & tactile cues on posture & form      Neck Exercises: Seated   Cervical Isometrics  Flexion;Right rotation;Left rotation;5 secs;10 reps vc for proper hand placement and resistance    Neck Retraction  1 rep;10 reps;5 secs      Manual Therapy   Manual Therapy  Soft tissue mobilization;Myofascial release    Manual therapy comments  All manual therapy performed to decreased tightness and improve cervical ROM.      Soft tissue mobilization  to bil upper traps, scalenes, levators and cervical paraspinals.             Myofascial Release  to bil cervical paraspinals, upper traps, rhomboids and scalenes    Manual Traction  suboccipital release for 1 minute holds x 3 reps    Neural Stretch  concurrent with gentle manual distraction of  cervical spine- passive overpressure to shoulder in inferior direction for increased passive stretching for 1 minute hold x 3 on each side      Neck Exercises: Stretches   Upper Trapezius Stretch  3 reps;30 seconds;Left;Right               PT Short Term Goals - 07/31/17 1451      PT SHORT TERM GOAL #1   Title  Pt will be independent with initial HEP in order to indicate improved functional mobility and decreased fall risk.  (Target Date: 07/22/17)    Baseline  07/18/17: per pt report    Time  4    Period  Weeks    Status  Achieved      PT SHORT TERM GOAL #2   Title  Pt will  improve 5TSS to </=16.50 secs without UE support in order to indicate decreased fall risk and improved functional strength.      Baseline  10.97 secs without UE support    Time  4    Period  Weeks    Status  Achieved      PT SHORT TERM GOAL #3   Title  Pt will improve FGA to 19/30 in order to indicate decreased fall risk.      Baseline  23/30 on 07/17/17    Period  Weeks    Status  Achieved      PT SHORT TERM GOAL #4   Title  Pt will ambulate at gait speed of >/=2.62 ft/sec with decreased indication of R foot slap/overt gait deviations indicating safe gait speed.     Baseline  3.53 ft/sec     Time  4    Period  Weeks    Status  Not Met      PT SHORT TERM GOAL #5   Title  Pt will ambulate outdoors over unlevel paved surfaces up to 1000' at mod I level in order to indicate improved community independence.     Baseline  07/18/17, goal met.    Time  4    Period  Weeks    Status  Achieved        PT Long Term Goals - 07/24/17 2059      PT LONG TERM GOAL #1   Title  Pt will be independent with final HEP in order to indicate improved functional mobility and decreased fall risk.  (Target Date: 08/21/17)    Time  8    Period  Weeks    Status  New      PT LONG TERM GOAL #2   Title  Pt will improve 5TSS to </=13.50 secs without UE support in order to indicate improved functional strength and decreased  fall risk.     Baseline  10.97 secs without UE support on 07/17/17    Time  8    Period  Weeks    Status  Achieved      PT LONG TERM GOAL #3   Title  Pt will improve FGA to >/=25/30 in order to indicate decreased fall risk.      Time  8    Period  Weeks    Status  Revised      PT LONG TERM GOAL #4   Title  Pt will ambulate >1000' over varying outdoor surfaces, scanning environment at independent level in order to indicate safe community and leisure mobility.     Time  8    Period  Weeks    Status  New      PT LONG TERM GOAL #5   Title  Pt will improve cervical rotation by 10 deg bilaterally in order to indicate improved safety with driving (when cleared to be without cervical collar).      Time  8    Period  Weeks    Status  New            Plan - 07/31/17 1504    Clinical Impression Statement  Skilled session continued with addressing cervical tightness, posture and high level balance. STGs were finished today; pt did not met gait speed goal. Pt would benefit from continued PT to reduce fall risk and meet unmet goals.    Rehab Potential  Good    Clinical Impairments Affecting Rehab Potential  severity of cord compression  PT Frequency  2x / week    PT Duration  8 weeks    PT Treatment/Interventions  ADLs/Self Care Home Management;Electrical Stimulation;Gait training;Stair training;Functional mobility training;Therapeutic activities;Therapeutic exercise;Balance training;Neuromuscular re-education;Patient/family education;Orthotic Fit/Training;Passive range of motion;Manual techniques    PT Next Visit Plan   Continue to address cervical tightness, all therapy without cervical collar, gentle neck ROM/ isometrics, high level balance and dynamic gait.    Consulted and Agree with Plan of Care  Patient       Patient will benefit from skilled therapeutic intervention in order to improve the following deficits and impairments:  Abnormal gait, Decreased activity tolerance, Decreased  balance, Decreased endurance, Decreased mobility, Decreased range of motion, Decreased strength, Hypomobility, Impaired perceived functional ability, Impaired flexibility, Postural dysfunction, Improper body mechanics, Impaired sensation  Visit Diagnosis: Muscle weakness (generalized)  Unsteadiness on feet  Other abnormalities of gait and mobility  Cervicalgia     Problem List Patient Active Problem List   Diagnosis Date Noted  . 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 S/P spinal surgery 05/24/2017  . Other secondary kyphosis, cervical region 05/24/2017  . Anxiety 11/21/2016  . Stenosis of cervical spine with myelopathy ([place]) 08/12/2016  . Routine general medical examination at a health care facility 06/01/2016  . Numbness and tingling in both hands 05/18/2015  . Hypothyroidism 03/24/2015  . History of tachycardia 03/24/2015  . HTN (hypertension), benign 06/12/2013  . ESRD on dialysis [facility]) 06/12/2013   Halina Andreas, [place] 07/31/2017, 3:22 PM  [facility] [address] [place], [place], [address] Phone: [phone]   Fax:  [phone]  Name: PRICILA BRIDGE MRN: [id number] Date of Birth: 03-22-1960

## 2017-08-03 ENCOUNTER — Encounter: Payer: Self-pay | Admitting: Rehabilitation

## 2017-08-03 ENCOUNTER — Ambulatory Visit: Payer: Medicare Other | Admitting: Occupational Therapy

## 2017-08-03 ENCOUNTER — Encounter: Payer: Self-pay | Admitting: Occupational Therapy

## 2017-08-03 ENCOUNTER — Ambulatory Visit: Payer: Medicare Other | Admitting: Rehabilitation

## 2017-08-03 DIAGNOSIS — M542 Cervicalgia: Secondary | ICD-10-CM | POA: Diagnosis not present

## 2017-08-03 DIAGNOSIS — M6281 Muscle weakness (generalized): Secondary | ICD-10-CM

## 2017-08-03 DIAGNOSIS — R2681 Unsteadiness on feet: Secondary | ICD-10-CM | POA: Diagnosis not present

## 2017-08-03 DIAGNOSIS — R2689 Other abnormalities of gait and mobility: Secondary | ICD-10-CM

## 2017-08-03 DIAGNOSIS — R278 Other lack of coordination: Secondary | ICD-10-CM | POA: Diagnosis not present

## 2017-08-03 NOTE — Patient Instructions (Addendum)
Thoracic Self-Mobilization (Sitting)    With small rolled towel at upper ribs level, gently lean back until stretch is felt. Hold __10-15__ seconds. Relax. Repeat _5___ times per set. Do __2__ sets per session. Do _1-2__ sessions per day.  http://orth.exer.us/999   Copyright  VHI. All rights reserved.   Pectoralis Stretch With Scapula Adduction: Supine (Towel Roll)    Lie with rolled towel or small pool noodle under spine vertically. Place arms in "T" position with palms facing ceiling.  Hold here for 2 mins.  Do 2-3 times per day.  If you are able to right arm higher, you can.   Do ___ times, ___ times per day.  http://ss.exer.us/357   Copyright  VHI. All rights reserved.    ANKLE: Dorsiflexion (Band)    Sit at edge of surface. Place band around top of foot. Keeping heel on floor, raise toes of banded foot. Hold _2-3__ seconds. Use __yellow______ band. _10__ reps per set, __1-2_ sets per day, _5-7__ days per week  Copyright  VHI. All rights reserved.

## 2017-08-03 NOTE — Therapy (Signed)
[facility] [address] [place], [place], [address] Phone: [phone]   Fax:  [phone]  Occupational Therapy Treatment  Patient Details  Name: [name] MRN: [id number] Date of Birth: April 07, 1960 Referring Provider: Dr. Ashok Pall   Encounter Date: 08/03/2017  OT End of Session - 08/03/17 1415    Visit Number  11    Number of Visits  17    Date for OT Re-Evaluation  09/20/17    Authorization Type  Medicare $3000 visit limt will need PN every 10th visit    Authorization Time Period  40 day cert. period  (through 09/20/17)    Authorization - Visit Number  11    Authorization - Number of Visits  20    OT Start Time  6962    OT Stop Time  1445    OT Time Calculation (min)  40 min    Activity Tolerance  Patient tolerated treatment well    Behavior During Therapy  WFL for tasks assessed/performed       Past Medical History:  Diagnosis Date  . Anemia   . Anxiety   . Arthritis   . Claustrophobia   . ESRD (end stage renal disease) on dialysis ([place])    "TTS; [place]" (03/24/2015)  . Glomerulonephritis   . Heart murmur   . HTN (hypertension)   . Hypothyroidism   . Kidney transplant recipient 01/21/2017  . Pancreatitis   . Renal insufficiency   . Secondary hyperparathyroidism ([place])    [name] 05/11/2014    Past Surgical History:  Procedure Laterality Date  . ANTERIOR CERVICAL CORPECTOMY N/A 05/24/2017   Procedure: CORPECTOMY CERVICAL FIVE- CERVICAL SIX;  Surgeon: Ashok Pall, MD;  Location: [facility];  Service: Neurosurgery;  Laterality: N/A;  . ARTERIOVENOUS GRAFT PLACEMENT Right 1990's?  . ARTERIOVENOUS GRAFT PLACEMENT Left 07/2002   upper arm/notes 06/02/2010  . ARTERIOVENOUS GRAFT PLACEMENT Right 07/2003   upper arm/notes 06/02/2010  . AV FISTULA PLACEMENT Left 09/2000   upper arm/notes 06/02/2010  . BREAST BIOPSY Left unsure   benign  . COLONOSCOPY    . DILATATION & CURRETTAGE/HYSTEROSCOPY  WITH RESECTOCOPE N/A 04/17/2013   Procedure: DILATATION & CURETTAGE/HYSTEROSCOPY WITH RESECTOCOPE;  Surgeon: Marvene Staff, MD;  Location: [facility];  Service: Gynecology;  Laterality: N/A;  . DILATION AND CURETTAGE OF UTERUS    . EYE SURGERY    . KIDNEY TRANSPLANT  1997  . KIDNEY TRANSPLANT  01/21/2017   Dr. Harrington Challenger  . PARATHYROIDECTOMY  03/2000 05/12/2014   w/neck exploration & autotransplantation/notes 06/02/2010; w/neck exploration  . PARATHYROIDECTOMY N/A 05/12/2014   Procedure: PARATHYROIDECTOMY AND NECK EXPLORATION;  Surgeon: Armandina Gemma, MD;  Location: [facility];  Service: General;  Laterality: N/A;  . PERITONEAL CATHETER INSERTION    . PERITONEAL CATHETER REMOVAL  08/1999   [name] 06/02/2010  . POSTERIOR CERVICAL FUSION/FORAMINOTOMY N/A 05/30/2017   Procedure: POSTERIOR CERVICAL Arthrodesis Cervical three - four, cervical four - five, cervical five - six, cervical six - seven;  Surgeon: Ashok Pall, MD;  Location: [facility];  Service: Neurosurgery;  Laterality: N/A;  . POSTERIOR CERVICAL LAMINECTOMY N/A 08/12/2016   Procedure: POSTERIOR CERVICAL LAMINECTOMY CERVICAL THREE CERVICAL FOUR, CERVICAL FOUR CERVICAL FIVE, CERVICAL FIVE- CERVICAL SIX, CERVICAL SIX- CERVICAL SEVEN;  Surgeon: Ashok Pall, MD;  Location: [facility];  Service: Neurosurgery;  Laterality: N/A;  POSTERIOR  . RETINAL DETACHMENT SURGERY Left   . THROMBECTOMY Left 02/2002   fistula/notes 06/02/2010  . THROMBECTOMY / ARTERIOVENOUS  GRAFT REVISION  11/2003; 01/2004; 08/07/2005; 08/10/2005; 10/2005   [name] 06/02/2010  . THROMBECTOMY AND REVISION OF ARTERIOVENTOUS (AV) GORETEX  GRAFT Right 01/2002; 11/2003; 01/2004; 08/07/2004; 08/10/2004; 11/13/2005   [name] 5/1/2012Marland Kitchen [name] 5/16/2012Marland Kitchen [name] 5/16/2012Mar[place] Kitchen [name] 5/16/2012Mar[place] Kitchen [name] 06/02/2010; [name] 06/02/2010  . THROMBECTOMY AND REVISION OF ARTERIOVENTOUS (AV) GORETEX  GRAFT Left 08/23/2002; 09/13/2002; 07/08/2003   [name] 06/02/2010; [name] 06/02/2010; [name] 06/02/2010    There were no vitals filed for  this visit.  Subjective Assessment - 08/03/17 1408    Subjective   Pt reports that she saw nephrologist yesterday, and she should be careful about doing too much activity/resistance pulling back with RUE (did in OT and PT Tuesday and was a little sore)    Pertinent History  s/p C5/6/7 anterior corpectomy (with strut graft and anterior plating) and posterior C3-T1 arthrodesis on 05/24/17.  Pt with PMH that includes:  Arthritis, ESRD, s/p kidney transplant 01/21/17, HTN, Hypotyroidism, heart murmur, anxiety, history of C3-C6 posterior decompression 2018    Limitations  Caution with resistive activities in shoulder extension RUE due to graft; Pt reports that she called MD office regarding restrictions and reports that she may begin low-range strengthening, ok to perform overhead reaching, no lifting lb limit, cervical brace for comfort    Patient Stated Goals  improve coordination/strength UEs, return to work    Currently in Pain?  No/denies         [facility] OT Assessment - 08/03/17 0001      Precautions   Precaution Comments  per pt:  no lifting restrictions, ok for overhead reach and to begin low range strengthening 07/03/17 caution w/ resistive activities w/ RUE in shoulder ext-graft       Picking up blocks with gripper set on level 1 (black spring) for sustained grip strength with min-mod difficulty with each UE.  Functional reaching at mid-range to place/remove small pegs in vertical pegboard to copy design with each UE with min cueing for compensation.  In quadruped, UE lifts in forward flex, cat/cow positions, with min-mod cueing, min facilitation for incr scapular/core stability.   In tall kneeling, rolling ball forward for shoulder stretch and incr core stability.  In sitting, shoulder flex with BUEs with ball and then shoulder flex with ball along wall in standing with min cueing for normal movement patterns, min facilitation for scapula in higher ranges.  Rotating relaxation balls in  each hand with min-mod difficulty for incr coordination.                    OT Short Term Goals - 07/31/17 1645      OT SHORT TERM GOAL #1   Title  Pt will be independent with initial HEP.--check STGs 07/22/17    Status  Achieved      OT SHORT TERM GOAL #2   Title  Pt will improve coordination for ADLs as shown by completing 9-hole peg test in less than 22sec with R hand and 24sec with L hand.     Baseline  R-24.44, L-29.47sec    Status  Partially Met      OT SHORT TERM GOAL #3   Title  Pt will be able to perform overhead reaching to retrieve light object without compensation.    Baseline  75% with compensation    Status  Achieved      OT SHORT TERM GOAL #4   Title  Pt will improve bilateral grip strength by at least 5lbs to assist with ADLs (opening containers, gripping).  Status  On-going      OT SHORT TERM GOAL #5   Title  Pt will improve lateral pinch strength by at least 4lbs bilaterally for work tasks/ADLs.    Status  Achieved        OT Long Term Goals - 07/31/17 1646      OT LONG TERM GOAL #1   Title  Pt will be independent with updated HEP.--check LTGs 08/22/17    Status  On-going      OT LONG TERM GOAL #2   Title  Pt will be able to retrieve at least 2lb object from overhead shelf with each UE safely.    Status  On-going      OT LONG TERM GOAL #3   Title  Pt will perform simulated fine motor work tasks (typing, writing, preparing slides, etc) with min difficulty/sufficient for return to work.    Status  On-going      OT LONG TERM GOAL #4   Title  Pt will improve 3point pinch strength by at least 4lbs bilaterally for work tasks/ADLs.    Status  -- 7/15:  9lbs right, 9 lbs left      OT LONG TERM GOAL #5   Title  Pt will improve bilateral grip strength by at least 10lbs to assist with ADLs (opening containers, gripping).    Status  On-going            Plan - 08/03/17 1415    Clinical Impression Statement  Pt is progressing towards goals  with less stiffness and improving strength, but continued difficulty/compensation with highter range reaching.  Pt also with improving coordination.    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      Occupational performance deficits (Please refer to evaluation for details):  ADL's;IADL's;Work;Leisure    Rehab Potential  Good    OT Frequency  2x / week    OT Duration  12 weeks    OT Treatment/Interventions  Self-care/ADL training;Therapeutic exercise;Moist Heat;Electrical Stimulation;Fluidtherapy;Paraffin;Neuromuscular education;Splinting;Patient/family education;Energy conservation;Therapist, nutritional;Therapeutic activities;Cryotherapy;Ultrasound;DME and/or AE instruction;Manual Therapy;Passive range of motion;Balance training    Plan  Functional reach, improve proximal UE strength, coordination/hand strength    Clinical Decision Making  Several treatment options, min-mod task modification necessary    OT Home Exercise Plan  Education provided:  green putty HEP, Coordination HEP, shoulder HEP    Consulted and Agree with Plan of Care  Patient       Patient will benefit from skilled therapeutic intervention in order to improve the following deficits and impairments:  Decreased knowledge of use of DME, Pain, Decreased coordination, Decreased mobility, Impaired sensation, Decreased strength, Decreased range of motion, Decreased endurance, Decreased activity tolerance, Decreased balance, Decreased knowledge of precautions, Impaired perceived functional ability, Impaired UE functional use  Visit Diagnosis: Muscle weakness (generalized)  Other lack of coordination  Unsteadiness on feet  Other abnormalities of gait and mobility    Problem List Patient Active Problem List    Diagnosis Date Noted  . 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 S/P spinal surgery 05/24/2017  . Other secondary kyphosis, cervical region 05/24/2017  . Anxiety 11/21/2016  . Stenosis of cervical spine with myelopathy ([place]) 08/12/2016  . Routine general medical examination at a health care facility 06/01/2016  . Numbness and tingling in both hands 05/18/2015  .  Hypothyroidism 03/24/2015  . History of tachycardia 03/24/2015  . HTN (hypertension), benign 06/12/2013  . ESRD on dialysis [facility]) 06/12/2013    [facility] 08/03/2017, 2:32 PM  [facility] [address] [place], [place], [address] Phone: [phone]   Fax:  [phone]  Name: [name] MRN: [id number] Date of Birth: [birth year]   Vianne Bulls, OTR/L [facility] [address] [place] [address] [place], [place]  [address] [phone] phone [phone] 08/03/17 2:33 PM

## 2017-08-03 NOTE — Therapy (Signed)
[facility] [address] [place], [place], [address] Phone: [phone]   Fax:  [phone]  Physical Therapy Treatment  Patient Details  Name: [name] MRN: [id number] Date of Birth: 1960-11-01 Referring Provider: Dr. Ashok Pall   Encounter Date: 08/03/2017  PT End of Session - 08/03/17 2042    Visit Number  13    Number of Visits  17    Date for PT Re-Evaluation  08/21/17    Authorization Type  Medicare    PT Start Time  1449    PT Stop Time  1530    PT Time Calculation (min)  41 min    Equipment Utilized During Treatment  Gait belt    Activity Tolerance  Patient tolerated treatment well    Behavior During Therapy  [facility] for tasks assessed/performed       Past Medical History:  Diagnosis Date  . Anemia   . Anxiety   . Arthritis   . Claustrophobia   . ESRD (end stage renal disease) on dialysis ([place])    "TTS; [place]" (03/24/2015)  . Glomerulonephritis   . Heart murmur   . HTN (hypertension)   . Hypothyroidism   . Kidney transplant recipient 01/21/2017  . Pancreatitis   . Renal insufficiency   . Secondary hyperparathyroidism ([place])    [name] 05/11/2014    Past Surgical History:  Procedure Laterality Date  . ANTERIOR CERVICAL CORPECTOMY N/A 05/24/2017   Procedure: CORPECTOMY CERVICAL FIVE- CERVICAL SIX;  Surgeon: Ashok Pall, MD;  Location: [facility];  Service: Neurosurgery;  Laterality: N/A;  . ARTERIOVENOUS GRAFT PLACEMENT Right 1990's?  . ARTERIOVENOUS GRAFT PLACEMENT Left 07/2002   upper arm/notes 06/02/2010  . ARTERIOVENOUS GRAFT PLACEMENT Right 07/2003   upper arm/notes 06/02/2010  . AV FISTULA PLACEMENT Left 09/2000   upper arm/notes 06/02/2010  . BREAST BIOPSY Left unsure   benign  . COLONOSCOPY    . DILATATION & CURRETTAGE/HYSTEROSCOPY WITH RESECTOCOPE N/A 04/17/2013   Procedure: DILATATION & CURETTAGE/HYSTEROSCOPY WITH RESECTOCOPE;  Surgeon: Marvene Staff, MD;  Location: [facility];  Service: Gynecology;  Laterality: N/A;  . DILATION AND CURETTAGE OF UTERUS    . EYE SURGERY    . KIDNEY TRANSPLANT  1997  . KIDNEY TRANSPLANT  01/21/2017   Dr. Harrington Challenger  . PARATHYROIDECTOMY  03/2000 05/12/2014   w/neck exploration & autotransplantation/notes 06/02/2010; w/neck exploration  . PARATHYROIDECTOMY N/A 05/12/2014   Procedure: PARATHYROIDECTOMY AND NECK EXPLORATION;  Surgeon: Armandina Gemma, MD;  Location: [facility];  Service: General;  Laterality: N/A;  . PERITONEAL CATHETER INSERTION    . PERITONEAL CATHETER REMOVAL  08/1999   [name] 06/02/2010  . POSTERIOR CERVICAL FUSION/FORAMINOTOMY N/A 05/30/2017   Procedure: POSTERIOR CERVICAL Arthrodesis Cervical three - four, cervical four - five, cervical five - six, cervical six - seven;  Surgeon: Ashok Pall, MD;  Location: [facility];  Service: Neurosurgery;  Laterality: N/A;  . POSTERIOR CERVICAL LAMINECTOMY N/A 08/12/2016   Procedure: POSTERIOR CERVICAL LAMINECTOMY CERVICAL THREE CERVICAL FOUR, CERVICAL FOUR CERVICAL FIVE, CERVICAL FIVE- CERVICAL SIX, CERVICAL SIX- CERVICAL SEVEN;  Surgeon: Ashok Pall, MD;  Location: [facility];  Service: Neurosurgery;  Laterality: N/A;  POSTERIOR  . RETINAL DETACHMENT SURGERY Left   . THROMBECTOMY Left 02/2002   fistula/notes 06/02/2010  . THROMBECTOMY / ARTERIOVENOUS GRAFT REVISION  11/2003; 01/2004; 08/07/2005; 08/10/2005; 10/2005   [name] 06/02/2010  . THROMBECTOMY AND REVISION OF ARTERIOVENTOUS (AV) GORETEX  GRAFT Right 01/2002; 11/2003; 01/2004; 08/07/2004; 08/10/2004; 11/13/2005   /  notes 05/18/2010; [name] 5/16/2012Marland Kitchen [name] 5/16/2012Marland Kitchen [name] 5/16/2012Marland Kitchen [name] 5/16/2012Marland Kitchen [name] 06/02/2010  . THROMBECTOMY AND REVISION OF ARTERIOVENTOUS (AV) GORETEX  GRAFT Left 08/23/2002; 09/13/2002; 07/08/2003   [name] 06/02/2010; [name] 06/02/2010; [name] 06/02/2010    There were no vitals filed for this visit.  Subjective Assessment - 08/03/17 1455    Subjective  Pt reports getting blood work done, dialysis port checked and he does not want  her to put a lot of stress on R UE.      Pertinent History  s/p kidney transplant 01/21/17    Limitations  House hold activities;Walking    How long can you walk comfortably?  Is able to ambulate approx 5-15 mins before she notices R foot dragging.     Patient Stated Goals  "I want to be able to walk without feeling like I"m getting ready to fall."    Currently in Pain?  No/denies                       [facility] Adult PT Treatment/Exercise - 08/03/17 1455      Ambulation/Gait   Gait Comments  Set up obstacle course as pt reports difficulty with balance when traversing over transitions outdoors (ie grass to pavement, gravel to grass, etc).  Set up blue therapy mat, 4" step, cones and two balance bubbles to step over, around and on top of.  Note that she demos little active DF bilaterally but esp in RLE despite cues from PT.  Provided pt with seated R ankle strengthening during session, see below.       Self-Care   Self-Care  Posture    Posture  Lengthy discussion during session regarding posture and sleeping position.  Pt reports she sleeps on R side and likely ends up in a fetal type position with L shoulder elevated.  Provided education and demo'd during session lying on R side on single flat pillow to allow R lateral flexion for L cervical muscle stretch.  Pt reports that this position feels very good.  Discussed use of small neck pillow to avoid hiking L shoulder during sleep if she does elevate HOB (has adjustable bed).  Discussed radiculopathy symptoms in BUEs during anterior chest stretch and how some of deficit is likley from neck but also possibly from a thoracic outlet type syndrome.  Pt verbalized understanding and reports less numbness in hands following stretches.        Exercises   Other Exercises   Seated ankle DF with yellow band x 10 reps      Neck Exercises: Seated   Other Seated Exercise  Seated self thoracic mobilization with thoracic extension over back of chair  (towel roll placed between to increase lever) x 5 reps of 10 secs.  Added to HEP.        Neck Exercises: Supine   Other Supine Exercise  Anterior pectoral stretch supine on towel roll with arms in "T" position progressing slightly on RUE.   Note marked tightness in L>R as in posterior aspect of spine.               PT Education - 08/03/17 1457    Education Details  see self care    Person(s) Educated  Patient    Methods  Explanation    Comprehension  Verbalized understanding       PT Short Term Goals - 07/31/17 1451      PT SHORT TERM GOAL #1   Title  Pt will  be independent with initial HEP in order to indicate improved functional mobility and decreased fall risk.  (Target Date: 07/22/17)    Baseline  07/18/17: per pt report    Time  4    Period  Weeks    Status  Achieved      PT SHORT TERM GOAL #2   Title  Pt will improve 5TSS to </=16.50 secs without UE support in order to indicate decreased fall risk and improved functional strength.      Baseline  10.97 secs without UE support    Time  4    Period  Weeks    Status  Achieved      PT SHORT TERM GOAL #3   Title  Pt will improve FGA to 19/30 in order to indicate decreased fall risk.      Baseline  23/30 on 07/17/17    Period  Weeks    Status  Achieved      PT SHORT TERM GOAL #4   Title  Pt will ambulate at gait speed of >/=2.62 ft/sec with decreased indication of R foot slap/overt gait deviations indicating safe gait speed.     Baseline  3.53 ft/sec     Time  4    Period  Weeks    Status  Not Met      PT SHORT TERM GOAL #5   Title  Pt will ambulate outdoors over unlevel paved surfaces up to 1000' at mod I level in order to indicate improved community independence.     Baseline  07/18/17, goal met.    Time  4    Period  Weeks    Status  Achieved        PT Long Term Goals - 07/24/17 2059      PT LONG TERM GOAL #1   Title  Pt will be independent with final HEP in order to indicate improved functional mobility and  decreased fall risk.  (Target Date: 08/21/17)    Time  8    Period  Weeks    Status  New      PT LONG TERM GOAL #2   Title  Pt will improve 5TSS to </=13.50 secs without UE support in order to indicate improved functional strength and decreased fall risk.     Baseline  10.97 secs without UE support on 07/17/17    Time  8    Period  Weeks    Status  Achieved      PT LONG TERM GOAL #3   Title  Pt will improve FGA to >/=25/30 in order to indicate decreased fall risk.      Time  8    Period  Weeks    Status  Revised      PT LONG TERM GOAL #4   Title  Pt will ambulate >1000' over varying outdoor surfaces, scanning environment at independent level in order to indicate safe community and leisure mobility.     Time  8    Period  Weeks    Status  New      PT LONG TERM GOAL #5   Title  Pt will improve cervical rotation by 10 deg bilaterally in order to indicate improved safety with driving (when cleared to be without cervical collar).      Time  8    Period  Weeks    Status  New            Plan - 08/03/17 2043  Clinical Impression Statement  Skilled session focused on sleeping posture for decreased L cervical tightness, exercises to open chest, and gait over obstacles with noted decreased ankle DF.  Provided additional exercises to address deficits.      Rehab Potential  Good    Clinical Impairments Affecting Rehab Potential  severity of cord compression     PT Frequency  2x / week    PT Duration  8 weeks    PT Treatment/Interventions  ADLs/Self Care Home Management;Electrical Stimulation;Gait training;Stair training;Functional mobility training;Therapeutic activities;Therapeutic exercise;Balance training;Neuromuscular re-education;Patient/family education;Orthotic Fit/Training;Passive range of motion;Manual techniques    PT Next Visit Plan   David-She is nervous for TPDN but she really has one very large trigger point on L upper medial scap region-likely levator?  I will likely  re-cert for another 4 weeks so add her over there for TPDN 1x/wk if you feel appropriate.  Continue to address cervical tightness, all therapy without cervical collar, gentle neck ROM/ isometrics, high level balance and dynamic gait.    Consulted and Agree with Plan of Care  Patient       Patient will benefit from skilled therapeutic intervention in order to improve the following deficits and impairments:  Abnormal gait, Decreased activity tolerance, Decreased balance, Decreased endurance, Decreased mobility, Decreased range of motion, Decreased strength, Hypomobility, Impaired perceived functional ability, Impaired flexibility, Postural dysfunction, Improper body mechanics, Impaired sensation  Visit Diagnosis: Muscle weakness (generalized)  Unsteadiness on feet  Other abnormalities of gait and mobility  Cervicalgia     Problem List Patient Active Problem List   Diagnosis Date Noted  . 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 S/P spinal surgery 05/24/2017  . Other secondary kyphosis, cervical region 05/24/2017  . Anxiety 11/21/2016  . Stenosis of cervical spine with myelopathy ([place]) 08/12/2016  . Routine general medical examination at a health care facility 06/01/2016  . Numbness and tingling in both hands 05/18/2015  . Hypothyroidism 03/24/2015  . History of tachycardia 03/24/2015  . HTN (hypertension), benign 06/12/2013  . ESRD on dialysis [facility]) 06/12/2013    Cameron Sprang, PT, MPT [facility] [address] [place], [place], [address] Phone: [phone]   Fax:  [phone] 08/03/17, 8:47 PM  Name: [name] MRN: [id number] Date of Birth: 04-28-1960

## 2017-08-07 ENCOUNTER — Encounter: Payer: Self-pay | Admitting: Physical Therapy

## 2017-08-07 ENCOUNTER — Encounter: Payer: Medicare Other | Admitting: Occupational Therapy

## 2017-08-07 ENCOUNTER — Ambulatory Visit: Payer: Medicare Other | Admitting: Occupational Therapy

## 2017-08-07 ENCOUNTER — Ambulatory Visit: Payer: Medicare Other | Admitting: Physical Therapy

## 2017-08-07 ENCOUNTER — Encounter: Payer: Self-pay | Admitting: Occupational Therapy

## 2017-08-07 DIAGNOSIS — R278 Other lack of coordination: Secondary | ICD-10-CM

## 2017-08-07 DIAGNOSIS — R2681 Unsteadiness on feet: Secondary | ICD-10-CM

## 2017-08-07 DIAGNOSIS — M6281 Muscle weakness (generalized): Secondary | ICD-10-CM | POA: Diagnosis not present

## 2017-08-07 DIAGNOSIS — M542 Cervicalgia: Secondary | ICD-10-CM

## 2017-08-07 DIAGNOSIS — R2689 Other abnormalities of gait and mobility: Secondary | ICD-10-CM

## 2017-08-07 NOTE — Therapy (Signed)
[facility] [address] [place], [place], [address] Phone: [phone]   Fax:  [phone]  Occupational Therapy Treatment  Patient Details  Name: [name] MRN: [id number] Date of Birth: 02-May-1960 Referring Provider: Dr. Ashok Pall   Encounter Date: 08/07/2017  OT End of Session - 08/07/17 1506    Visit Number  12    Number of Visits  17    Date for OT Re-Evaluation  09/20/17    Authorization Type  Medicare $3000 visit limt will need PN every 10th visit    Authorization Time Period  17 day cert. period  (through 09/20/17)    Authorization - Visit Number  12    Authorization - Number of Visits  20    OT Start Time  0263    OT Stop Time  1400    OT Time Calculation (min)  43 min    Activity Tolerance  Patient tolerated treatment well    Behavior During Therapy  WFL for tasks assessed/performed       Past Medical History:  Diagnosis Date  . Anemia   . Anxiety   . Arthritis   . Claustrophobia   . ESRD (end stage renal disease) on dialysis ([place])    "TTS; [place]" (03/24/2015)  . Glomerulonephritis   . Heart murmur   . HTN (hypertension)   . Hypothyroidism   . Kidney transplant recipient 01/21/2017  . Pancreatitis   . Renal insufficiency   . Secondary hyperparathyroidism ([place])    [name] 05/11/2014    Past Surgical History:  Procedure Laterality Date  . ANTERIOR CERVICAL CORPECTOMY N/A 05/24/2017   Procedure: CORPECTOMY CERVICAL FIVE- CERVICAL SIX;  Surgeon: Ashok Pall, MD;  Location: [facility];  Service: Neurosurgery;  Laterality: N/A;  . ARTERIOVENOUS GRAFT PLACEMENT Right 1990's?  . ARTERIOVENOUS GRAFT PLACEMENT Left 07/2002   upper arm/notes 06/02/2010  . ARTERIOVENOUS GRAFT PLACEMENT Right 07/2003   upper arm/notes 06/02/2010  . AV FISTULA PLACEMENT Left 09/2000   upper arm/notes 06/02/2010  . BREAST BIOPSY Left unsure   benign  . COLONOSCOPY    . DILATATION & CURRETTAGE/HYSTEROSCOPY  WITH RESECTOCOPE N/A 04/17/2013   Procedure: DILATATION & CURETTAGE/HYSTEROSCOPY WITH RESECTOCOPE;  Surgeon: Marvene Staff, MD;  Location: [facility];  Service: Gynecology;  Laterality: N/A;  . DILATION AND CURETTAGE OF UTERUS    . EYE SURGERY    . KIDNEY TRANSPLANT  1997  . KIDNEY TRANSPLANT  01/21/2017   Dr. Harrington Challenger  . PARATHYROIDECTOMY  03/2000 05/12/2014   w/neck exploration & autotransplantation/notes 06/02/2010; w/neck exploration  . PARATHYROIDECTOMY N/A 05/12/2014   Procedure: PARATHYROIDECTOMY AND NECK EXPLORATION;  Surgeon: Armandina Gemma, MD;  Location: [facility];  Service: General;  Laterality: N/A;  . PERITONEAL CATHETER INSERTION    . PERITONEAL CATHETER REMOVAL  08/1999   [name] 06/02/2010  . POSTERIOR CERVICAL FUSION/FORAMINOTOMY N/A 05/30/2017   Procedure: POSTERIOR CERVICAL Arthrodesis Cervical three - four, cervical four - five, cervical five - six, cervical six - seven;  Surgeon: Ashok Pall, MD;  Location: [facility];  Service: Neurosurgery;  Laterality: N/A;  . POSTERIOR CERVICAL LAMINECTOMY N/A 08/12/2016   Procedure: POSTERIOR CERVICAL LAMINECTOMY CERVICAL THREE CERVICAL FOUR, CERVICAL FOUR CERVICAL FIVE, CERVICAL FIVE- CERVICAL SIX, CERVICAL SIX- CERVICAL SEVEN;  Surgeon: Ashok Pall, MD;  Location: [facility];  Service: Neurosurgery;  Laterality: N/A;  POSTERIOR  . RETINAL DETACHMENT SURGERY Left   . THROMBECTOMY Left 02/2002   fistula/notes 06/02/2010  . THROMBECTOMY / ARTERIOVENOUS  GRAFT REVISION  11/2003; 01/2004; 08/07/2005; 08/10/2005; 10/2005   [name] 06/02/2010  . THROMBECTOMY AND REVISION OF ARTERIOVENTOUS (AV) GORETEX  GRAFT Right 01/2002; 11/2003; 01/2004; 08/07/2004; 08/10/2004; 11/13/2005   [name] 5/1/2012Marland Kitchen [name] 5/16/2012Marland Kitchen [name] 5/16/2012Marland Kitchen [name] 5/16/2012Marland Kitchen [name] 06/02/2010; [name] 06/02/2010  . THROMBECTOMY AND REVISION OF ARTERIOVENTOUS (AV) GORETEX  GRAFT Left 08/23/2002; 09/13/2002; 07/08/2003   [name] 06/02/2010; [name] 06/02/2010; [name] 06/02/2010    There were no vitals filed for  this visit.  Subjective Assessment - 08/07/17 1320    Subjective   I just still have a little numbness.  My surgeon indicates that this will take a little time.      Pertinent History  s/p C5/6/7 anterior corpectomy (with strut graft and anterior plating) and posterior C3-T1 arthrodesis on 05/24/17.  Pt with PMH that includes:  Arthritis, ESRD, s/p kidney transplant 01/21/17, HTN, Hypotyroidism, heart murmur, anxiety, history of C3-C6 posterior decompression 2018    Limitations  Caution with resistive activities in shoulder extension RUE due to graft; Pt reports that she called MD office regarding restrictions and reports that she may begin low-range strengthening, ok to perform overhead reaching, no lifting lb limit, cervical brace for comfort    Patient Stated Goals  improve coordination/strength UEs, return to work    Currently in Pain?  No/denies    Pain Score  0-No pain                   OT Treatments/Exercises (OP) - 08/07/17 0001      Neurological Re-education Exercises   Finger Flexion  Digiflex 1.5 individual and mass, 3.0 individial and mass, 5lb mass grasp x 10 reps each hand    Other Exercises 1  Worked to improve functional fine motor coordination; cutting, stringing beads, manipulating change with coins.      Other Exercises 2  Fine motor coordination - [place] pegboard and tweezers - with emphasis on grading pressure throu tweezer to change orientation of peg.      Hand Gripper with Large Beads  LEVEL 2 minimal dropping.               OT Education - 08/07/17 1459    Education Details  Reinforced MD's caution with strengthening RIGHT UE as needed for phoresis    Person(s) Educated  Patient    Methods  Explanation    Comprehension  Verbalized understanding       OT Short Term Goals - 08/07/17 1332      OT SHORT TERM GOAL #4   Title  Pt will improve bilateral grip strength by at least 5lbs to assist with ADLs (opening containers, gripping).    Status   Achieved R- 40, L- 47        OT Long Term Goals - 08/07/17 1335      OT LONG TERM GOAL #2   Title  Pt will be able to retrieve at least 2lb object from overhead shelf with each UE safely.    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      Occupational performance deficits (Please refer to evaluation for  details):  ADL's;IADL's;Work;Leisure    Rehab Potential  Good    OT Frequency  2x / week    OT Duration  12 weeks    OT Treatment/Interventions  Self-care/ADL training;Therapeutic exercise;Moist Heat;Electrical Stimulation;Fluidtherapy;Paraffin;Neuromuscular education;Splinting;Patient/family education;Energy conservation;Therapist, nutritional;Therapeutic activities;Cryotherapy;Ultrasound;DME and/or AE instruction;Manual Therapy;Passive range of motion;Balance training    Plan  Functional reach, improve proximal UE strength, coordination/hand (r>l) strength - check LTG's - quickly approaching goal achievement    Clinical Decision Making  Several treatment options, min-mod task modification necessary    OT Home Exercise Plan  Education provided:  green putty HEP, Coordination HEP, shoulder HEP    Consulted and Agree with Plan of Care  Patient       Patient will benefit from skilled therapeutic intervention in order to improve the following deficits and impairments:  Decreased knowledge of use of DME, Pain, Decreased coordination, Decreased mobility, Impaired sensation, Decreased strength, Decreased range of motion, Decreased endurance, Decreased activity tolerance, Decreased balance, Decreased knowledge of precautions, Impaired  perceived functional ability, Impaired UE functional use  Visit Diagnosis: Other lack of coordination  Muscle weakness (generalized)  Unsteadiness on feet    Problem List Patient Active Problem List   Diagnosis Date Noted  . 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 S/P spinal surgery 05/24/2017  . Other secondary kyphosis, cervical region 05/24/2017  . Anxiety 11/21/2016  . Stenosis of cervical spine with myelopathy ([place]) 08/12/2016  . Routine general medical examination at a health care facility 06/01/2016  . Numbness and tingling in both hands 05/18/2015  . Hypothyroidism 03/24/2015  . History of tachycardia 03/24/2015  . HTN (hypertension), benign 06/12/2013  . ESRD on dialysis [facility]) 06/12/2013    Mariah Milling, OTR/L 08/07/2017, 3:12 PM  [facility] [address] [place], [place], [address] Phone: [phone]   Fax:  [phone]  Name: [name] MRN: [id number] Date of Birth: [birth year]

## 2017-08-07 NOTE — Therapy (Signed)
[facility] [address] [place], [place], [address] Phone: [phone]   Fax:  [phone]  Physical Therapy Treatment  Patient Details  Name: [name] MRN: [id number] Date of Birth: 1960/11/22 Referring Provider: Dr. Ashok Pall   Encounter Date: 08/07/2017  PT End of Session - 08/07/17 1536    Visit Number  14    Number of Visits  17    Date for PT Re-Evaluation  08/21/17    Authorization Type  Medicare    PT Start Time  6270   PT Stop Time  1444    PT Time Calculation (min)  42 min    Equipment Utilized During Treatment  Gait belt    Activity Tolerance  Patient tolerated treatment well    Behavior During Therapy  [facility] for tasks assessed/performed       Past Medical History:  Diagnosis Date  . Anemia   . Anxiety   . Arthritis   . Claustrophobia   . ESRD (end stage renal disease) on dialysis ([place])    "TTS; [facility]" (03/24/2015)  . Glomerulonephritis   . Heart murmur   . HTN (hypertension)   . Hypothyroidism   . Kidney transplant recipient 01/21/2017  . Pancreatitis   . Renal insufficiency   . Secondary hyperparathyroidism ([place])    [name] 05/11/2014    Past Surgical History:  Procedure Laterality Date  . ANTERIOR CERVICAL CORPECTOMY N/A 05/24/2017   Procedure: CORPECTOMY CERVICAL FIVE- CERVICAL SIX;  Surgeon: Ashok Pall, MD;  Location: [facility];  Service: Neurosurgery;  Laterality: N/A;  . ARTERIOVENOUS GRAFT PLACEMENT Right 1990's?  . ARTERIOVENOUS GRAFT PLACEMENT Left 07/2002   upper arm/notes 06/02/2010  . ARTERIOVENOUS GRAFT PLACEMENT Right 07/2003   upper arm/notes 06/02/2010  . AV FISTULA PLACEMENT Left 09/2000   upper arm/notes 06/02/2010  . BREAST BIOPSY Left unsure   benign  . COLONOSCOPY    . DILATATION & CURRETTAGE/HYSTEROSCOPY WITH RESECTOCOPE N/A 04/17/2013   Procedure: DILATATION & CURETTAGE/HYSTEROSCOPY WITH RESECTOCOPE;  Surgeon: Marvene Staff, MD;  Location: [facility];  Service: Gynecology;  Laterality: N/A;  . DILATION AND CURETTAGE OF UTERUS    . EYE SURGERY    . KIDNEY TRANSPLANT  1997  . KIDNEY TRANSPLANT  01/21/2017   Dr. Harrington Challenger  . PARATHYROIDECTOMY  03/2000 05/12/2014   w/neck exploration & autotransplantation/notes 06/02/2010; w/neck exploration  . PARATHYROIDECTOMY N/A 05/12/2014   Procedure: PARATHYROIDECTOMY AND NECK EXPLORATION;  Surgeon: Armandina Gemma, MD;  Location: [facility];  Service: General;  Laterality: N/A;  . PERITONEAL CATHETER INSERTION    . PERITONEAL CATHETER REMOVAL  08/1999   [name] 06/02/2010  . POSTERIOR CERVICAL FUSION/FORAMINOTOMY N/A 05/30/2017   Procedure: POSTERIOR CERVICAL Arthrodesis Cervical three - four, cervical four - five, cervical five - six, cervical six - seven;  Surgeon: Ashok Pall, MD;  Location: [facility];  Service: Neurosurgery;  Laterality: N/A;  . POSTERIOR CERVICAL LAMINECTOMY N/A 08/12/2016   Procedure: POSTERIOR CERVICAL LAMINECTOMY CERVICAL THREE CERVICAL FOUR, CERVICAL FOUR CERVICAL FIVE, CERVICAL FIVE- CERVICAL SIX, CERVICAL SIX- CERVICAL SEVEN;  Surgeon: Ashok Pall, MD;  Location: [facility];  Service: Neurosurgery;  Laterality: N/A;  POSTERIOR  . RETINAL DETACHMENT SURGERY Left   . THROMBECTOMY Left 02/2002   fistula/notes 06/02/2010  . THROMBECTOMY / ARTERIOVENOUS GRAFT REVISION  11/2003; 01/2004; 08/07/2005; 08/10/2005; 10/2005   [name] 06/02/2010  . THROMBECTOMY AND REVISION OF ARTERIOVENTOUS (AV) GORETEX  GRAFT Right 01/2002; 11/2003; 01/2004; 08/07/2004; 08/10/2004; 11/13/2005   [name]  5/1/2012Marland Kitchen [name] 5/16/2012Marland Kitchen [name] 5/16/2012Marland Kitchen [name] 5/16/2012Marland Kitchen [name] 06/02/2010; [name] 06/02/2010  . THROMBECTOMY AND REVISION OF ARTERIOVENTOUS (AV) GORETEX  GRAFT Left 08/23/2002; 09/13/2002; 07/08/2003   [name] 06/02/2010; [name] 06/02/2010; [name] 06/02/2010    There were no vitals filed for this visit.  Subjective Assessment - 08/07/17 1403    Subjective  Reports sleeping positions is helping some. Reports no pain still has  tightness. Pt seems nervous about dry needling apt on Thursday.     Pertinent History  s/p kidney transplant 01/21/17    Limitations  House hold activities;Walking    How long can you walk comfortably?  Is able to ambulate approx 5-15 mins before she notices R foot dragging.     Patient Stated Goals  "I want to be able to walk without feeling like I"m getting ready to fall."           [facility] Adult PT Treatment/Exercise - 08/07/17 1540      High Level Balance   High Level Balance Comments  In hallway pt instucted in dynamic gt including walking with EC, backwards walking and tandem x 2 set each. Pt needing min guard with each activity to prevent LOB and prevent fall; No UE support. Pt tended to deviate to the L with EC.  When instructed with tandem walking pt took 2 steps before having to catch balance and reposition her foot.  In // bars on blue balance beam side stepping and tandem walking with heel touches , pt requiring min guard assist and intermittent UE support to maintain balance. Verbal cues for posture. Also On blue beam SLS x 30 secs x 2 each LE with EO. Pt needing intermittent UE support and min gaurd assist to maintain balance. At counter top on red mat toe/heel walking fwd/bwk x 2 reps x 2 sets each. No UE support and S.       Manual Therapy   Manual Therapy  Soft tissue mobilization;Myofascial release    Manual therapy comments  All manual therapy performed to decreased tightness and improve cervical ROM.      Soft tissue mobilization  to bil upper traps, scalenes, levators and cervical paraspinals.             Myofascial Release  to bil cervical paraspinals, upper traps, rhomboids and scalenes    Manual Traction  suboccipital release for 1 minute holds x 3 reps    Neural Stretch  concurrent with gentle manual distraction of cervical spine- passive overpressure to shoulder in inferior direction for increased passive stretching for 1 minute hold x 3 on each side      Neck Exercises:  Stretches   Levator Stretch  Right;Left;2 reps;30 seconds    Lower Cervical/Upper Thoracic Stretch  2 reps;60 seconds lying on small green noodle    Other Neck Stretches  seated Cervical rotation stretch x 30 sec x 2 sets                PT Short Term Goals - 07/31/17 1451      PT SHORT TERM GOAL #1   Title  Pt will be independent with initial HEP in order to indicate improved functional mobility and decreased fall risk.  (Target Date: 07/22/17)    Baseline  07/18/17: per pt report    Time  4    Period  Weeks    Status  Achieved      PT SHORT TERM GOAL #2   Title  Pt will improve 5TSS to </=16.50 secs  without UE support in order to indicate decreased fall risk and improved functional strength.      Baseline  10.97 secs without UE support    Time  4    Period  Weeks    Status  Achieved      PT SHORT TERM GOAL #3   Title  Pt will improve FGA to 19/30 in order to indicate decreased fall risk.      Baseline  23/30 on 07/17/17    Period  Weeks    Status  Achieved      PT SHORT TERM GOAL #4   Title  Pt will ambulate at gait speed of >/=2.62 ft/sec with decreased indication of R foot slap/overt gait deviations indicating safe gait speed.     Baseline  3.53 ft/sec     Time  4    Period  Weeks    Status  Not Met      PT SHORT TERM GOAL #5   Title  Pt will ambulate outdoors over unlevel paved surfaces up to 1000' at mod I level in order to indicate improved community independence.     Baseline  07/18/17, goal met.    Time  4    Period  Weeks    Status  Achieved        PT Long Term Goals - 07/24/17 2059      PT LONG TERM GOAL #1   Title  Pt will be independent with final HEP in order to indicate improved functional mobility and decreased fall risk.  (Target Date: 08/21/17)    Time  8    Period  Weeks    Status  New      PT LONG TERM GOAL #2   Title  Pt will improve 5TSS to </=13.50 secs without UE support in order to indicate improved functional strength and decreased fall  risk.     Baseline  10.97 secs without UE support on 07/17/17    Time  8    Period  Weeks    Status  Achieved      PT LONG TERM GOAL #3   Title  Pt will improve FGA to >/=25/30 in order to indicate decreased fall risk.      Time  8    Period  Weeks    Status  Revised      PT LONG TERM GOAL #4   Title  Pt will ambulate >1000' over varying outdoor surfaces, scanning environment at independent level in order to indicate safe community and leisure mobility.     Time  8    Period  Weeks    Status  New      PT LONG TERM GOAL #5   Title  Pt will improve cervical rotation by 10 deg bilaterally in order to indicate improved safety with driving (when cleared to be without cervical collar).      Time  8    Period  Weeks    Status  New            Plan - 08/07/17 1537    Clinical Impression Statement  Today's skilled session focused on addressing cervical tightness, high level balance and dynamic gait. Pt is progressing and would benefit from continued PT in order to meet unmet goals.    Rehab Potential  Good    Clinical Impairments Affecting Rehab Potential  severity of cord compression     PT Frequency  2x / week    PT  Duration  8 weeks    PT Treatment/Interventions  ADLs/Self Care Home Management;Electrical Stimulation;Gait training;Stair training;Functional mobility training;Therapeutic activities;Therapeutic exercise;Balance training;Neuromuscular re-education;Patient/family education;Orthotic Fit/Training;Passive range of motion;Manual techniques    PT Next Visit Plan   David-She is nervous for TPDN but she really has one very large trigger point on L upper medial scap region-likely levator?  I will likely re-cert for another 4 weeks so add her over there for TPDN 1x/wk if you feel appropriate.  Continue to address cervical tightness, all therapy without cervical collar, gentle neck ROM/ isometrics, high level balance and dynamic gait.    Consulted and Agree with Plan of Care  Patient        Patient will benefit from skilled therapeutic intervention in order to improve the following deficits and impairments:  Abnormal gait, Decreased activity tolerance, Decreased balance, Decreased endurance, Decreased mobility, Decreased range of motion, Decreased strength, Hypomobility, Impaired perceived functional ability, Impaired flexibility, Postural dysfunction, Improper body mechanics, Impaired sensation  Visit Diagnosis: Muscle weakness (generalized)  Other lack of coordination  Unsteadiness on feet  Other abnormalities of gait and mobility  Cervicalgia     Problem List Patient Active Problem List   Diagnosis Date Noted  . 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 S/P spinal surgery 05/24/2017  . Other secondary kyphosis, cervical region 05/24/2017  . Anxiety 11/21/2016  . Stenosis of cervical spine with myelopathy ([place]) 08/12/2016  . Routine general medical examination at a health care facility 06/01/2016  . Numbness and tingling in both hands 05/18/2015  . Hypothyroidism 03/24/2015  . History of tachycardia 03/24/2015  . HTN (hypertension), benign 06/12/2013  . ESRD on dialysis [facility]) 06/12/2013   Halina Andreas, [place] 08/07/2017, 3:59 PM  [facility] [address] [place], [place], [address] Phone: [phone]   Fax:  [phone]  Name: HARRIS KISTLER MRN: [id number] Date of Birth: December 15, 1960

## 2017-08-10 ENCOUNTER — Encounter: Payer: Self-pay | Admitting: Occupational Therapy

## 2017-08-10 ENCOUNTER — Ambulatory Visit: Payer: Medicare Other | Admitting: Physical Therapy

## 2017-08-10 ENCOUNTER — Ambulatory Visit: Payer: Medicare Other | Admitting: Occupational Therapy

## 2017-08-10 DIAGNOSIS — R278 Other lack of coordination: Secondary | ICD-10-CM

## 2017-08-10 DIAGNOSIS — M6281 Muscle weakness (generalized): Secondary | ICD-10-CM

## 2017-08-10 DIAGNOSIS — M542 Cervicalgia: Secondary | ICD-10-CM | POA: Diagnosis not present

## 2017-08-10 DIAGNOSIS — R2689 Other abnormalities of gait and mobility: Secondary | ICD-10-CM

## 2017-08-10 DIAGNOSIS — R2681 Unsteadiness on feet: Secondary | ICD-10-CM

## 2017-08-10 NOTE — Therapy (Signed)
[facility] [address] [place], [place], [address] Phone: [phone]   Fax:  [phone]  Occupational Therapy Treatment  Patient Details  Name: [name] MRN: [id number] Date of Birth: Aug 19, 1960 Referring Provider: Dr. Ashok Pall   Encounter Date: 08/10/2017  OT End of Session - 08/10/17 1409    Visit Number  13    Number of Visits  17    Date for OT Re-Evaluation  09/20/17    Authorization Type  Medicare $3000 visit limt will need PN every 10th visit    Authorization Time Period  60 day cert. period  (through 09/20/17)    Authorization - Visit Number  13    Authorization - Number of Visits  20    OT Start Time  9458    OT Stop Time  1445    OT Time Calculation (min)  40 min    Activity Tolerance  Patient tolerated treatment well    Behavior During Therapy  WFL for tasks assessed/performed       Past Medical History:  Diagnosis Date  . Anemia   . Anxiety   . Arthritis   . Claustrophobia   . ESRD (end stage renal disease) on dialysis ([place])    "TTS; [facility]" (03/24/2015)  . Glomerulonephritis   . Heart murmur   . HTN (hypertension)   . Hypothyroidism   . Kidney transplant recipient 01/21/2017  . Pancreatitis   . Renal insufficiency   . Secondary hyperparathyroidism ([place])    [name] 05/11/2014    Past Surgical History:  Procedure Laterality Date  . ANTERIOR CERVICAL CORPECTOMY N/A 05/24/2017   Procedure: CORPECTOMY CERVICAL FIVE- CERVICAL SIX;  Surgeon: Ashok Pall, MD;  Location: [facility];  Service: Neurosurgery;  Laterality: N/A;  . ARTERIOVENOUS GRAFT PLACEMENT Right 1990's?  . ARTERIOVENOUS GRAFT PLACEMENT Left 07/2002   upper arm/notes 06/02/2010  . ARTERIOVENOUS GRAFT PLACEMENT Right 07/2003   upper arm/notes 06/02/2010  . AV FISTULA PLACEMENT Left 09/2000   upper arm/notes 06/02/2010  . BREAST BIOPSY Left unsure   benign  . COLONOSCOPY    . DILATATION & CURRETTAGE/HYSTEROSCOPY  WITH RESECTOCOPE N/A 04/17/2013   Procedure: DILATATION & CURETTAGE/HYSTEROSCOPY WITH RESECTOCOPE;  Surgeon: Marvene Staff, MD;  Location: [facility];  Service: Gynecology;  Laterality: N/A;  . DILATION AND CURETTAGE OF UTERUS    . EYE SURGERY    . KIDNEY TRANSPLANT  1997  . KIDNEY TRANSPLANT  01/21/2017   Dr. Harrington Challenger  . PARATHYROIDECTOMY  03/2000 05/12/2014   w/neck exploration & autotransplantation/notes 06/02/2010; w/neck exploration  . PARATHYROIDECTOMY N/A 05/12/2014   Procedure: PARATHYROIDECTOMY AND NECK EXPLORATION;  Surgeon: Armandina Gemma, MD;  Location: [facility];  Service: General;  Laterality: N/A;  . PERITONEAL CATHETER INSERTION    . PERITONEAL CATHETER REMOVAL  08/1999   [name] 06/02/2010  . POSTERIOR CERVICAL FUSION/FORAMINOTOMY N/A 05/30/2017   Procedure: POSTERIOR CERVICAL Arthrodesis Cervical three - four, cervical four - five, cervical five - six, cervical six - seven;  Surgeon: Ashok Pall, MD;  Location: [facility];  Service: Neurosurgery;  Laterality: N/A;  . POSTERIOR CERVICAL LAMINECTOMY N/A 08/12/2016   Procedure: POSTERIOR CERVICAL LAMINECTOMY CERVICAL THREE CERVICAL FOUR, CERVICAL FOUR CERVICAL FIVE, CERVICAL FIVE- CERVICAL SIX, CERVICAL SIX- CERVICAL SEVEN;  Surgeon: Ashok Pall, MD;  Location: [facility];  Service: Neurosurgery;  Laterality: N/A;  POSTERIOR  . RETINAL DETACHMENT SURGERY Left   . THROMBECTOMY Left 02/2002   fistula/notes 06/02/2010  . THROMBECTOMY / ARTERIOVENOUS  GRAFT REVISION  11/2003; 01/2004; 08/07/2005; 08/10/2005; 10/2005   [name] 06/02/2010  . THROMBECTOMY AND REVISION OF ARTERIOVENTOUS (AV) GORETEX  GRAFT Right 01/2002; 11/2003; 01/2004; 08/07/2004; 08/10/2004; 11/13/2005   [name] 5/1/2012Marland Kitchen [name] 5/16/2012Marland Kitchen [name] 5/16/2012Marland Kitchen [name] 5/16/2012Marland Kitchen [name] 06/02/2010; [name] 06/02/2010  . THROMBECTOMY AND REVISION OF ARTERIOVENTOUS (AV) GORETEX  GRAFT Left 08/23/2002; 09/13/2002; 07/08/2003   [name] 06/02/2010; [name] 06/02/2010; [name] 06/02/2010    There were no vitals filed for  this visit.  Subjective Assessment - 08/10/17 1407    Subjective   wasn't able to get the dry needling today    Pertinent History  s/p C5/6/7 anterior corpectomy (with strut graft and anterior plating) and posterior C3-T1 arthrodesis on 05/24/17.  Pt with PMH that includes:  Arthritis, ESRD, s/p kidney transplant 01/21/17, HTN, Hypotyroidism, heart murmur, anxiety, history of C3-C6 posterior decompression 2018    Limitations  Caution with resistive activities in shoulder extension RUE due to graft; Pt reports that she called MD office regarding restrictions and reports that she may begin low-range strengthening, ok to perform overhead reaching, no lifting lb limit, cervical brace for comfort    Patient Stated Goals  improve coordination/strength UEs, return to work    Currently in Pain?  No/denies        Placing [place] pegs in pegboard with each hand with min incr time/difficulty--improving.  Functional reaching to place/remove clothespins with 1-8lb resistance on vertical pole for incr activity tolerance and strength.  Picking up blocks with gripper set on level 3 (black spring) for sustained grip strength with mod difficulty for each hand.  Completing purdue pegboard for incr coordination each hand.                      OT Short Term Goals - 08/10/17 1409      OT SHORT TERM GOAL #1   Title  Pt will be independent with initial HEP.--check STGs 07/22/17    Status  Achieved      OT SHORT TERM GOAL #2   Title  Pt will improve coordination for ADLs as shown by completing 9-hole peg test in less than 22sec with R hand and 24sec with L hand.     Baseline  R-24.44, L-29.47sec    Status  Partially Met      OT SHORT TERM GOAL #3   Title  Pt will be able to perform overhead reaching to retrieve light object without compensation.    Baseline  75% with compensation    Status  Achieved      OT SHORT TERM GOAL #4   Title  Pt will improve bilateral grip strength by at least  5lbs to assist with ADLs (opening containers, gripping).    Status  Achieved R-40, L-47lbs      OT SHORT TERM GOAL #5   Title  Pt will improve lateral pinch strength by at least 4lbs bilaterally for work tasks/ADLs.    Status  Achieved        OT Long Term Goals - 08/10/17 1411      OT LONG TERM GOAL #1   Title  Pt will be independent with updated HEP.--check LTGs 08/22/17    Time  12    Period  Weeks    Status  On-going      OT LONG TERM GOAL #2   Title  Pt will be able to retrieve at least 2lb object from overhead shelf with each UE safely.    Time  12  Period  Weeks    Status  Achieved      OT LONG TERM GOAL #3   Title  Pt will perform simulated fine motor work tasks (typing, writing, preparing slides, etc) with min difficulty/sufficient for return to work.    Time  12    Period  Weeks    Status  On-going      OT LONG TERM GOAL #4   Title  Pt will improve 3point pinch strength by at least 4lbs bilaterally for work tasks/ADLs.    Baseline  R-7bs, L-6lbs    Time  12    Period  Weeks    Status  On-going R-9lbs, L-9lbs      OT LONG TERM GOAL #5   Title  Pt will improve bilateral grip strength by at least 10lbs to assist with ADLs (opening containers, gripping).    Baseline  R-35lbs, L-32lbs    Time  12    Period  Weeks    Status  On-going      OT LONG TERM GOAL #6   Title  Pt will be able to perform mod complex IADLs (vacuuming, carrying heavier groceries, etc).  mod I.    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      Occupational performance deficits (Please refer to evaluation for  details):  ADL's;IADL's;Work;Leisure    Rehab Potential  Good    OT Frequency  2x / week    OT Duration  12 weeks    OT Treatment/Interventions  Self-care/ADL training;Therapeutic exercise;Moist Heat;Electrical Stimulation;Fluidtherapy;Paraffin;Neuromuscular education;Splinting;Patient/family education;Energy conservation;Therapist, nutritional;Therapeutic activities;Cryotherapy;Ultrasound;DME and/or AE instruction;Manual Therapy;Passive range of motion;Balance training    Plan  Functional reach, improve proximal UE strength, coordination/hand (r>l) strength - check LTG's - quickly approaching goal achievement    Clinical Decision Making  Several treatment options, min-mod task modification necessary    OT Home Exercise Plan  Education provided:  green putty HEP, Coordination HEP, shoulder HEP    Consulted and Agree with Plan of Care  Patient       Patient will benefit from skilled therapeutic intervention in order to improve the following deficits and impairments:  Decreased knowledge of use of DME, Pain, Decreased coordination, Decreased mobility, Impaired sensation, Decreased strength, Decreased range of motion, Decreased endurance, Decreased activity tolerance, Decreased balance, Decreased knowledge of precautions, Impaired perceived functional ability, Impaired UE functional use  Visit Diagnosis: Other lack of coordination  Muscle weakness (generalized)  Unsteadiness on feet  Other abnormalities of gait and mobility    Problem List Patient Active Problem List   Diagnosis Date Noted  . 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 S/P spinal surgery 05/24/2017  . Other secondary kyphosis, cervical region 05/24/2017  . Anxiety 11/21/2016  . Stenosis of cervical spine with myelopathy ([place]) 08/12/2016  . Routine general medical examination at a health care facility 06/01/2016  . Numbness and tingling in both hands 05/18/2015  . Hypothyroidism 03/24/2015  . History of tachycardia  03/24/2015  . HTN (hypertension), benign 06/12/2013  . ESRD on dialysis [facility])  06/12/2013    [facility] 08/10/2017, 2:16 PM  [facility] [address] [place], [place], [address] Phone: [phone]   Fax:  [phone]  Name: BIRDIA JAYCOX MRN: [id number] Date of Birth: 09-11-1960   Vianne Bulls, OTR/L [facility] [address] [place], [place]  [address] [phone] phone [phone] 08/10/17 2:16 PM

## 2017-08-11 ENCOUNTER — Encounter: Payer: Self-pay | Admitting: Physical Therapy

## 2017-08-11 NOTE — Therapy (Signed)
[place], [place], [address] Phone: [phone]   Fax:  [phone]  Physical Therapy Treatment  Patient Details  Name: [name] MRN: [id number] Date of Birth: 1960-02-27 Referring Provider: Dr. Ashok Pall   Encounter Date: 08/10/2017  PT End of Session - 08/11/17 0747    Visit Number  15    Number of Visits  17    Date for PT Re-Evaluation  08/21/17    Authorization Type  Medicare    PT Start Time  0845    PT Stop Time  0923    PT Time Calculation (min)  38 min    Activity Tolerance  Patient tolerated treatment well    Behavior During Therapy  [facility] for tasks assessed/performed       Past Medical History:  Diagnosis Date  . Anemia   . Anxiety   . Arthritis   . Claustrophobia   . ESRD (end stage renal disease) on dialysis ([place])    "TTS; [place]" (03/24/2015)  . Glomerulonephritis   . Heart murmur   . HTN (hypertension)   . Hypothyroidism   . Kidney transplant recipient 01/21/2017  . Pancreatitis   . Renal insufficiency   . Secondary hyperparathyroidism ([place])    [name] 05/11/2014    Past Surgical History:  Procedure Laterality Date  . ANTERIOR CERVICAL CORPECTOMY N/A 05/24/2017   Procedure: CORPECTOMY CERVICAL FIVE- CERVICAL SIX;  Surgeon: Ashok Pall, MD;  Location: [place];  Service: Neurosurgery;  Laterality: N/A;  . ARTERIOVENOUS GRAFT PLACEMENT Right 1990's?  . ARTERIOVENOUS GRAFT PLACEMENT Left 07/2002   upper arm/notes 06/02/2010  . ARTERIOVENOUS GRAFT PLACEMENT Right 07/2003   upper arm/notes 06/02/2010  . AV FISTULA PLACEMENT Left 09/2000   upper arm/notes 06/02/2010  . BREAST BIOPSY Left unsure   benign  . COLONOSCOPY    . DILATATION & CURRETTAGE/HYSTEROSCOPY WITH RESECTOCOPE N/A 04/17/2013   Procedure: DILATATION & CURETTAGE/HYSTEROSCOPY WITH RESECTOCOPE;  Surgeon: Marvene Staff, MD;  Location: [facility];  Service: Gynecology;  Laterality: N/A;  . DILATION  AND CURETTAGE OF UTERUS    . EYE SURGERY    . KIDNEY TRANSPLANT  1997  . KIDNEY TRANSPLANT  01/21/2017   Dr. Harrington Challenger  . PARATHYROIDECTOMY  03/2000 05/12/2014   w/neck exploration & autotransplantation/notes 06/02/2010; w/neck exploration  . PARATHYROIDECTOMY N/A 05/12/2014   Procedure: PARATHYROIDECTOMY AND NECK EXPLORATION;  Surgeon: Armandina Gemma, MD;  Location: [facility];  Service: General;  Laterality: N/A;  . PERITONEAL CATHETER INSERTION    . PERITONEAL CATHETER REMOVAL  08/1999   [name] 06/02/2010  . POSTERIOR CERVICAL FUSION/FORAMINOTOMY N/A 05/30/2017   Procedure: POSTERIOR CERVICAL Arthrodesis Cervical three - four, cervical four - five, cervical five - six, cervical six - seven;  Surgeon: Ashok Pall, MD;  Location: [facility];  Service: Neurosurgery;  Laterality: N/A;  . POSTERIOR CERVICAL LAMINECTOMY N/A 08/12/2016   Procedure: POSTERIOR CERVICAL LAMINECTOMY CERVICAL THREE CERVICAL FOUR, CERVICAL FOUR CERVICAL FIVE, CERVICAL FIVE- CERVICAL SIX, CERVICAL SIX- CERVICAL SEVEN;  Surgeon: Ashok Pall, MD;  Location: [place];  Service: Neurosurgery;  Laterality: N/A;  POSTERIOR  . RETINAL DETACHMENT SURGERY Left   . THROMBECTOMY Left 02/2002   fistula/notes 06/02/2010  . THROMBECTOMY / ARTERIOVENOUS GRAFT REVISION  11/2003; 01/2004; 08/07/2005; 08/10/2005; 10/2005   [name] 06/02/2010  . THROMBECTOMY AND REVISION OF ARTERIOVENTOUS (AV) GORETEX  GRAFT Right 01/2002; 11/2003; 01/2004; 08/07/2004; 08/10/2004; 11/13/2005   [name] 5/1/2012Marland Kitchen [name] 5/16/2012Marland Kitchen [name] 5/16/2012Marland Kitchen [name] 5/16/2012Marland Kitchen [name] 5/16/2012Marland Kitchen [name]  06/02/2010  . THROMBECTOMY AND REVISION OF ARTERIOVENTOUS (AV) GORETEX  GRAFT Left 08/23/2002; 09/13/2002; 07/08/2003   [name] 06/02/2010; [name] 06/02/2010; [name] 06/02/2010    There were no vitals filed for this visit.  Subjective Assessment - 08/11/17 0737    Subjective  Patient reports contoinud tightness and spasming in her neck. She has been working on her exercises and stretches.     Pertinent  History  s/p kidney transplant 01/21/17    Limitations  House hold activities;Walking    How long can you walk comfortably?  Is able to ambulate approx 5-15 mins before she notices R foot dragging.     Patient Stated Goals  "I want to be able to walk without feeling like I"m getting ready to fall."    Currently in Pain?  No/denies    Pain Orientation  Posterior    Pain Type  Chronic pain    Pain Onset  More than a month ago    Pain Frequency  Constant    Aggravating Factors   turning head     Pain Relieving Factors  tylenol, stretching, manual therapy     Effect of Pain on Daily Activities  OT                        [facility] Adult PT Treatment/Exercise - 08/11/17 0001      Manual Therapy   Manual Therapy  Soft tissue mobilization;Myofascial release    Manual therapy comments  All manual therapy performed to decreased tightness and improve cervical ROM.      Soft tissue mobilization  to bil upper traps, scalenes, levators and cervical paraspinals.   IATYM to periscpaular, upper trap, and cervical paraspinals    Myofascial Release  to bil cervical paraspinals, upper traps, rhomboids and scalenes    Manual Traction  suboccipital release              PT Education - 08/11/17 0739    Education Details  reviewed benefits and risks of TPDN but not perfromed.     Person(s) Educated  Patient    Methods  Explanation;Demonstration;Tactile cues;Verbal cues    Comprehension  Verbalized understanding;Returned demonstration;Verbal cues required;Tactile cues required       PT Short Term Goals - 07/31/17 1451      PT SHORT TERM GOAL #1   Title  Pt will be independent with initial HEP in order to indicate improved functional mobility and decreased fall risk.  (Target Date: 07/22/17)    Baseline  07/18/17: per pt report    Time  4    Period  Weeks    Status  Achieved      PT SHORT TERM GOAL #2   Title  Pt will improve 5TSS to </=16.50 secs without UE support in order to indicate  decreased fall risk and improved functional strength.      Baseline  10.97 secs without UE support    Time  4    Period  Weeks    Status  Achieved      PT SHORT TERM GOAL #3   Title  Pt will improve FGA to 19/30 in order to indicate decreased fall risk.      Baseline  23/30 on 07/17/17    Period  Weeks    Status  Achieved      PT SHORT TERM GOAL #4   Title  Pt will ambulate at gait speed of >/=2.62 ft/sec with decreased indication of R foot  slap/overt gait deviations indicating safe gait speed.     Baseline  3.53 ft/sec     Time  4    Period  Weeks    Status  Not Met      PT SHORT TERM GOAL #5   Title  Pt will ambulate outdoors over unlevel paved surfaces up to 1000' at mod I level in order to indicate improved community independence.     Baseline  07/18/17, goal met.    Time  4    Period  Weeks    Status  Achieved        PT Long Term Goals - 07/24/17 2059      PT LONG TERM GOAL #1   Title  Pt will be independent with final HEP in order to indicate improved functional mobility and decreased fall risk.  (Target Date: 08/21/17)    Time  8    Period  Weeks    Status  New      PT LONG TERM GOAL #2   Title  Pt will improve 5TSS to </=13.50 secs without UE support in order to indicate improved functional strength and decreased fall risk.     Baseline  10.97 secs without UE support on 07/17/17    Time  8    Period  Weeks    Status  Achieved      PT LONG TERM GOAL #3   Title  Pt will improve FGA to >/=25/30 in order to indicate decreased fall risk.      Time  8    Period  Weeks    Status  Revised      PT LONG TERM GOAL #4   Title  Pt will ambulate >1000' over varying outdoor surfaces, scanning environment at independent level in order to indicate safe community and leisure mobility.     Time  8    Period  Weeks    Status  New      PT LONG TERM GOAL #5   Title  Pt will improve cervical rotation by 10 deg bilaterally in order to indicate improved safety with driving (when  cleared to be without cervical collar).      Time  8    Period  Weeks    Status  New            Plan - 08/11/17 0749    Clinical Impression Statement  Patient was about to needle the patient wehn it was noted she is on an imunosuppressant. She will go to her surgeon on Monday. Therapy will schedule her as able if she iscleared by surgeon. Therapy will instead focus     Clinical Presentation  Evolving    Clinical Decision Making  Moderate    Rehab Potential  Good    Clinical Impairments Affecting Rehab Potential  severity of cord compression     PT Frequency  2x / week    PT Duration  8 weeks    PT Treatment/Interventions  ADLs/Self Care Home Management;Electrical Stimulation;Gait training;Stair training;Functional mobility training;Therapeutic activities;Therapeutic exercise;Balance training;Neuromuscular re-education;Patient/family education;Orthotic Fit/Training;Passive range of motion;Manual techniques    PT Next Visit Plan   Arrabella Westerman-She is nervous for TPDN but she really has one very large trigger point on L upper medial scap region-likely levator?  I will likely re-cert for another 4 weeks so add her over there for TPDN 1x/wk if you feel appropriate.  Continue to address cervical tightness, all therapy without cervical collar, gentle neck ROM/ isometrics, high  level balance and dynamic gait.    Consulted and Agree with Plan of Care  Patient       Patient will benefit from skilled therapeutic intervention in order to improve the following deficits and impairments:  Abnormal gait, Decreased activity tolerance, Decreased balance, Decreased endurance, Decreased mobility, Decreased range of motion, Decreased strength, Hypomobility, Impaired perceived functional ability, Impaired flexibility, Postural dysfunction, Improper body mechanics, Impaired sensation  Visit Diagnosis: Other lack of coordination  Muscle weakness (generalized)  Unsteadiness on feet  Other abnormalities of gait  and mobility  Cervicalgia     Problem List Patient Active Problem List   Diagnosis Date Noted  . 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 S/P spinal surgery 05/24/2017  . Other secondary kyphosis, cervical region 05/24/2017  . Anxiety 11/21/2016  . Stenosis of cervical spine with myelopathy ([place]) 08/12/2016  . Routine general medical examination at a health care facility 06/01/2016  . Numbness and tingling in both hands 05/18/2015  . Hypothyroidism 03/24/2015  . History of tachycardia 03/24/2015  . HTN (hypertension), benign 06/12/2013  . ESRD on dialysis [facility]) 06/12/2013    Carney Living PT DPT  08/11/2017, 7:56 AM  [facility] [address] [place], [place], [address] Phone: [phone]   Fax:  [phone]  Name: TAISA DELORIA MRN: [id number] Date of Birth: Apr 25, 1960

## 2017-08-14 ENCOUNTER — Encounter: Payer: Self-pay | Admitting: Physical Therapy

## 2017-08-14 ENCOUNTER — Ambulatory Visit: Payer: Medicare Other | Admitting: Physical Therapy

## 2017-08-14 ENCOUNTER — Ambulatory Visit: Payer: Medicare Other | Admitting: Occupational Therapy

## 2017-08-14 ENCOUNTER — Encounter: Payer: Medicare Other | Admitting: Occupational Therapy

## 2017-08-14 DIAGNOSIS — M6281 Muscle weakness (generalized): Secondary | ICD-10-CM

## 2017-08-14 DIAGNOSIS — R2681 Unsteadiness on feet: Secondary | ICD-10-CM | POA: Diagnosis not present

## 2017-08-14 DIAGNOSIS — Z4822 Encounter for aftercare following kidney transplant: Secondary | ICD-10-CM | POA: Diagnosis not present

## 2017-08-14 DIAGNOSIS — R2689 Other abnormalities of gait and mobility: Secondary | ICD-10-CM | POA: Diagnosis not present

## 2017-08-14 DIAGNOSIS — R278 Other lack of coordination: Secondary | ICD-10-CM | POA: Diagnosis not present

## 2017-08-14 DIAGNOSIS — M542 Cervicalgia: Secondary | ICD-10-CM | POA: Diagnosis not present

## 2017-08-14 DIAGNOSIS — Z940 Kidney transplant status: Secondary | ICD-10-CM | POA: Diagnosis not present

## 2017-08-14 NOTE — Therapy (Signed)
[facility] [address] [place], [place], [address] Phone: [phone]   Fax:  [phone]  Occupational Therapy Treatment  Patient Details  Name: [name] MRN: [id number] Date of Birth: [birth year] Referring Provider: Dr. Ashok Pall   Encounter Date: 08/14/2017  OT End of Session - 08/14/17 1422    Visit Number  14    Number of Visits  17    Date for OT Re-Evaluation  09/20/17    Authorization Type  Medicare $3000 visit limt will need PN every 10th visit    Authorization Time Period  37 day cert. period  (through 09/20/17)    Authorization - Visit Number  14    Authorization - Number of Visits  20    OT Start Time  1320    OT Stop Time  1405    OT Time Calculation (min)  45 min    Activity Tolerance  Patient tolerated treatment well    Behavior During Therapy  WFL for tasks assessed/performed       Past Medical History:  Diagnosis Date  . Anemia   . Anxiety   . Arthritis   . Claustrophobia   . ESRD (end stage renal disease) on dialysis ([place])    "TTS; [place]" (03/24/2015)  . Glomerulonephritis   . Heart murmur   . HTN (hypertension)   . Hypothyroidism   . Kidney transplant recipient 01/21/2017  . Pancreatitis   . Renal insufficiency   . Secondary hyperparathyroidism ([place])    [name] 05/11/2014    Past Surgical History:  Procedure Laterality Date  . ANTERIOR CERVICAL CORPECTOMY N/A 05/24/2017   Procedure: CORPECTOMY CERVICAL FIVE- CERVICAL SIX;  Surgeon: Ashok Pall, MD;  Location: [facility];  Service: Neurosurgery;  Laterality: N/A;  . ARTERIOVENOUS GRAFT PLACEMENT Right 1990's?  . ARTERIOVENOUS GRAFT PLACEMENT Left 07/2002   upper arm/notes 06/02/2010  . ARTERIOVENOUS GRAFT PLACEMENT Right 07/2003   upper arm/notes 06/02/2010  . AV FISTULA PLACEMENT Left 09/2000   upper arm/notes 06/02/2010  . BREAST BIOPSY Left unsure   benign  . COLONOSCOPY    . DILATATION & CURRETTAGE/HYSTEROSCOPY  WITH RESECTOCOPE N/A 04/17/2013   Procedure: DILATATION & CURETTAGE/HYSTEROSCOPY WITH RESECTOCOPE;  Surgeon: Marvene Staff, MD;  Location: [facility];  Service: Gynecology;  Laterality: N/A;  . DILATION AND CURETTAGE OF UTERUS    . EYE SURGERY    . KIDNEY TRANSPLANT  1997  . KIDNEY TRANSPLANT  01/21/2017   Dr. Harrington Challenger  . PARATHYROIDECTOMY  03/2000 05/12/2014   w/neck exploration & autotransplantation/notes 06/02/2010; w/neck exploration  . PARATHYROIDECTOMY N/A 05/12/2014   Procedure: PARATHYROIDECTOMY AND NECK EXPLORATION;  Surgeon: Armandina Gemma, MD;  Location: [facility];  Service: General;  Laterality: N/A;  . PERITONEAL CATHETER INSERTION    . PERITONEAL CATHETER REMOVAL  08/1999   [name] 06/02/2010  . POSTERIOR CERVICAL FUSION/FORAMINOTOMY N/A 05/30/2017   Procedure: POSTERIOR CERVICAL Arthrodesis Cervical three - four, cervical four - five, cervical five - six, cervical six - seven;  Surgeon: Ashok Pall, MD;  Location: [facility];  Service: Neurosurgery;  Laterality: N/A;  . POSTERIOR CERVICAL LAMINECTOMY N/A 08/12/2016   Procedure: POSTERIOR CERVICAL LAMINECTOMY CERVICAL THREE CERVICAL FOUR, CERVICAL FOUR CERVICAL FIVE, CERVICAL FIVE- CERVICAL SIX, CERVICAL SIX- CERVICAL SEVEN;  Surgeon: Ashok Pall, MD;  Location: [facility];  Service: Neurosurgery;  Laterality: N/A;  POSTERIOR  . RETINAL DETACHMENT SURGERY Left   . THROMBECTOMY Left 02/2002   fistula/notes 06/02/2010  . THROMBECTOMY / ARTERIOVENOUS  GRAFT REVISION  11/2003; 01/2004; 08/07/2005; 08/10/2005; 10/2005   [name] 06/02/2010  . THROMBECTOMY AND REVISION OF ARTERIOVENTOUS (AV) GORETEX  GRAFT Right 01/2002; 11/2003; 01/2004; 08/07/2004; 08/10/2004; 11/13/2005   [name] 5/1/2012Marland Kitchen [name] 5/16/2012Marland Kitchen [name] 5/16/2012Marland Kitchen [name] 5/16/2012Marland Kitchen [name] 06/02/2010; [name] 06/02/2010  . THROMBECTOMY AND REVISION OF ARTERIOVENTOUS (AV) GORETEX  GRAFT Left 08/23/2002; 09/13/2002; 07/08/2003   [name] 06/02/2010; [name] 06/02/2010; [name] 06/02/2010    There were no vitals filed for  this visit.  Subjective Assessment - 08/14/17 1321    Pertinent History  s/p C5/6/7 anterior corpectomy (with strut graft and anterior plating) and posterior C3-T1 arthrodesis on 05/24/17.  Pt with PMH that includes:  Arthritis, ESRD, s/p kidney transplant 01/21/17, HTN, Hypotyroidism, heart murmur, anxiety, history of C3-C6 posterior decompression 2018    Limitations  **no lifting > 5 lbs on RUE. Caution with resistive activities in shoulder extension RUE due to graft; Pt reports that she called MD office regarding restrictions and reports that she may begin low-range strengthening, ok to perform overhead reaching, no lifting lb limit,    Patient Stated Goals  improve coordination/strength UEs, return to work    Currently in Pain?  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 weight w/ palms facing each other, x 10 reps. Followed by circumduction ex's at 90* sh. flexion w/ 2 lb. weight RUE, then LUE    Other ROM/Strengthening Exercises  Standing: Bilateral AA/ROM high level sh. flexion with physioball along wall       Hand Exercises   Other Hand Exercises  Gripper set at level 3 to pick up blocks Rt hand with one rest break, min difficulty      Fine Motor Coordination (Hand/Wrist)   Fine Motor Coordination  O'Connor pegs    O'Connor pegs  Pt placing O'Connor pegs in pegboard with tweezers - 2 rows with Rt hand, 2 rows with Lt hand               OT Short Term Goals - 08/10/17 1409      OT SHORT TERM GOAL #1   Title  Pt will be independent with initial HEP.--check STGs 07/22/17    Status  Achieved      OT SHORT TERM GOAL #2   Title  Pt will improve coordination for ADLs as shown by  completing 9-hole peg test in less than 22sec with R hand and 24sec with L hand.     Baseline  R-24.44, L-29.47sec    Status  Partially Met      OT SHORT TERM GOAL #3   Title  Pt will be able to perform overhead reaching to retrieve light object without compensation.    Baseline  75% with compensation    Status  Achieved      OT SHORT TERM GOAL #4   Title  Pt will improve bilateral grip strength by at least 5lbs to assist with ADLs (opening containers, gripping).    Status  Achieved R-40, L-47lbs      OT SHORT TERM GOAL #5   Title  Pt will improve lateral pinch  strength by at least 4lbs bilaterally for work tasks/ADLs.    Status  Achieved        OT Long Term Goals - 08/14/17 1423      OT LONG TERM GOAL #1   Title  Pt will be independent with updated HEP.--check LTGs 08/22/17    Time  12    Period  Weeks    Status  On-going      OT LONG TERM GOAL #2   Title  Pt will be able to retrieve at least 2lb object from overhead shelf with each UE safely.    Time  12    Period  Weeks    Status  Achieved      OT LONG TERM GOAL #3   Title  Pt will perform simulated fine motor work tasks (typing, writing, preparing slides, etc) with min difficulty/sufficient for return to work.    Time  12    Period  Weeks    Status  Achieved      OT LONG TERM GOAL #4   Title  Pt will improve 3point pinch strength by at least 4lbs bilaterally for work tasks/ADLs.    Baseline  R-7bs, L-6lbs    Time  12    Period  Weeks    Status  On-going R-9lbs, L-9lbs      OT LONG TERM GOAL #5   Title  Pt will improve bilateral grip strength by at least 10lbs to assist with ADLs (opening containers, gripping).    Baseline  R-35lbs, L-32lbs    Time  12    Period  Weeks    Status  Partially Met met on Lt.  Rt = 43 lbs, Lt = 50 lbs      OT LONG TERM GOAL #6   Title  Pt will be able to perform mod complex IADLs (vacuuming, carrying heavier groceries, etc).  mod I.    Time  12    Period  Weeks    Status   Achieved            Plan - 08/14/17 1424    Clinical Impression Statement  Pt is making good progress towards remaining goals.      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      Occupational performance deficits (Please refer to evaluation for details):  ADL's;IADL's;Work;Leisure    Rehab Potential  Good    OT Frequency  2x / week    OT Duration  12 weeks    OT Treatment/Interventions  Self-care/ADL training;Therapeutic exercise;Moist Heat;Electrical Stimulation;Fluidtherapy;Paraffin;Neuromuscular education;Splinting;Patient/family education;Energy conservation;Therapist, nutritional;Therapeutic activities;Cryotherapy;Ultrasound;DME and/or AE instruction;Manual Therapy;Passive range of motion;Balance training    Plan  Functional reach, improve proximal UE strength, update HEP as able, assess progress towards remaining goals    OT Home Exercise Plan  Education provided:  green putty HEP, Coordination HEP, shoulder HEP    Consulted and Agree with Plan of Care  Patient       Patient will benefit from skilled therapeutic intervention in order to improve the following deficits and impairments:  Decreased knowledge of use of DME, Pain, Decreased coordination, Decreased mobility, Impaired sensation, Decreased strength, Decreased range of motion, Decreased endurance, Decreased activity tolerance, Decreased balance, Decreased knowledge of precautions, Impaired perceived functional ability, Impaired UE functional use  Visit Diagnosis: Other  lack of coordination  Muscle weakness (generalized)    Problem List Patient Active Problem List   Diagnosis Date Noted  . 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 S/P spinal surgery 05/24/2017  . Other secondary kyphosis, cervical region  05/24/2017  . Anxiety 11/21/2016  . Stenosis of cervical spine with myelopathy ([place]) 08/12/2016  . Routine general medical examination at a health care facility 06/01/2016  . Numbness and tingling in both hands 05/18/2015  . Hypothyroidism 03/24/2015  . History of tachycardia 03/24/2015  . HTN (hypertension), benign 06/12/2013  . ESRD on dialysis [facility]) 06/12/2013    Carey Bullocks, OTR/L 08/14/2017, 2:27 PM  [facility] [address] [place], [place], [address] Phone: [phone]   Fax:  [phone]  Name: NATARSHA HURWITZ MRN: [id number] Date of Birth: 1960/12/24

## 2017-08-15 NOTE — Therapy (Signed)
[facility] [address] [place], [place], [address] Phone: [phone]   Fax:  [phone]  Physical Therapy Treatment  Patient Details  Name: [name] MRN: [id number] Date of Birth: 14-Sep-1960 Referring Provider: Dr. Ashok Pall   Encounter Date: 08/14/2017  PT End of Session - 08/14/17 1407    Visit Number  16    Number of Visits  17    Date for PT Re-Evaluation  08/21/17    Authorization Type  Medicare    PT Start Time  7062    PT Stop Time  3762    PT Time Calculation (min)  40 min    Activity Tolerance  Patient tolerated treatment well;No increased pain    Behavior During Therapy  WFL for tasks assessed/performed       Past Medical History:  Diagnosis Date  . Anemia   . Anxiety   . Arthritis   . Claustrophobia   . ESRD (end stage renal disease) on dialysis ([place])    "TTS; [facility]" (03/24/2015)  . Glomerulonephritis   . Heart murmur   . HTN (hypertension)   . Hypothyroidism   . Kidney transplant recipient 01/21/2017  . Pancreatitis   . Renal insufficiency   . Secondary hyperparathyroidism ([place])    [name] 05/11/2014    Past Surgical History:  Procedure Laterality Date  . ANTERIOR CERVICAL CORPECTOMY N/A 05/24/2017   Procedure: CORPECTOMY CERVICAL FIVE- CERVICAL SIX;  Surgeon: Ashok Pall, MD;  Location: [facility];  Service: Neurosurgery;  Laterality: N/A;  . ARTERIOVENOUS GRAFT PLACEMENT Right 1990's?  . ARTERIOVENOUS GRAFT PLACEMENT Left 07/2002   upper arm/notes 06/02/2010  . ARTERIOVENOUS GRAFT PLACEMENT Right 07/2003   upper arm/notes 06/02/2010  . AV FISTULA PLACEMENT Left 09/2000   upper arm/notes 06/02/2010  . BREAST BIOPSY Left unsure   benign  . COLONOSCOPY    . DILATATION & CURRETTAGE/HYSTEROSCOPY WITH RESECTOCOPE N/A 04/17/2013   Procedure: DILATATION & CURETTAGE/HYSTEROSCOPY WITH RESECTOCOPE;  Surgeon: Marvene Staff, MD;  Location: [facility];  Service: Gynecology;   Laterality: N/A;  . DILATION AND CURETTAGE OF UTERUS    . EYE SURGERY    . KIDNEY TRANSPLANT  1997  . KIDNEY TRANSPLANT  01/21/2017   Dr. Harrington Challenger  . PARATHYROIDECTOMY  03/2000 05/12/2014   w/neck exploration & autotransplantation/notes 06/02/2010; w/neck exploration  . PARATHYROIDECTOMY N/A 05/12/2014   Procedure: PARATHYROIDECTOMY AND NECK EXPLORATION;  Surgeon: Armandina Gemma, MD;  Location: [facility];  Service: General;  Laterality: N/A;  . PERITONEAL CATHETER INSERTION    . PERITONEAL CATHETER REMOVAL  08/1999   [name] 06/02/2010  . POSTERIOR CERVICAL FUSION/FORAMINOTOMY N/A 05/30/2017   Procedure: POSTERIOR CERVICAL Arthrodesis Cervical three - four, cervical four - five, cervical five - six, cervical six - seven;  Surgeon: Ashok Pall, MD;  Location: [facility];  Service: Neurosurgery;  Laterality: N/A;  . POSTERIOR CERVICAL LAMINECTOMY N/A 08/12/2016   Procedure: POSTERIOR CERVICAL LAMINECTOMY CERVICAL THREE CERVICAL FOUR, CERVICAL FOUR CERVICAL FIVE, CERVICAL FIVE- CERVICAL SIX, CERVICAL SIX- CERVICAL SEVEN;  Surgeon: Ashok Pall, MD;  Location: [facility];  Service: Neurosurgery;  Laterality: N/A;  POSTERIOR  . RETINAL DETACHMENT SURGERY Left   . THROMBECTOMY Left 02/2002   fistula/notes 06/02/2010  . THROMBECTOMY / ARTERIOVENOUS GRAFT REVISION  11/2003; 01/2004; 08/07/2005; 08/10/2005; 10/2005   [name] 06/02/2010  . THROMBECTOMY AND REVISION OF ARTERIOVENTOUS (AV) GORETEX  GRAFT Right 01/2002; 11/2003; 01/2004; 08/07/2004; 08/10/2004; 11/13/2005   [name] 5/1/2012Marland Kitchen [name] 5/16/2012Marland Kitchen [name] 06/02/2010; [name] 06/02/2010; /  notes 06/02/2010; [name] 06/02/2010  . THROMBECTOMY AND REVISION OF ARTERIOVENTOUS (AV) GORETEX  GRAFT Left 08/23/2002; 09/13/2002; 07/08/2003   [name] 06/02/2010; [name] 06/02/2010; [name] 06/02/2010    There were no vitals filed for this visit.  Subjective Assessment - 08/14/17 1406    Subjective  No new complaints. Waiting to hear from her MD about clearance for dry needling.     Pertinent History   s/p kidney transplant 01/21/17    Limitations  House hold activities;Walking    How long can you walk comfortably?  Is able to ambulate approx 5-15 mins before she notices R foot dragging.     Patient Stated Goals  "I want to be able to walk without feeling like I"m getting ready to fall."    Currently in Pain?  No/denies    Pain Score  0-No pain          [facility] Adult PT Treatment/Exercise - 08/14/17 1408      Moist Heat Therapy   Number Minutes Moist Heat  10 Minutes concurrent with e-stim      Electrical Stimulation   Electrical Stimulation Location  cervical paraspinals and upper traps    Electrical Stimulation Action  for decreased muscle tightness/trigger points concurrent with moist hot pack    Electrical Stimulation Parameters  IFC with intensity to tolerance    Electrical Stimulation Goals  Pain      Manual Therapy   Manual Therapy  Soft tissue mobilization    Soft tissue mobilization  to bil upper traps, scalenes, levators and cervical paraspinals.   IATYM to periscpaular, upper trap, and cervical paraspinals    Myofascial Release  to bil cervical paraspinals, upper traps, rhomboids and scalenes    Manual Traction  suboccipital release for 30 sec holds x 5 reps; gentle cervical distraction, progressing to cervical     Neural Stretch  concurrent with gentle manual distraction of cervical spine- passive overpressure to shoulder in inferior direction for increased passive stretching for 1 minute hold x 3 on each side, then concurrent with pt performing scapular retraction x 10 reps, then with pt performing scapular depression (punching toward feet) x 10 reps.           PT Short Term Goals - 07/31/17 1451      PT SHORT TERM GOAL #1   Title  Pt will be independent with initial HEP in order to indicate improved functional mobility and decreased fall risk.  (Target Date: 07/22/17)    Baseline  07/18/17: per pt report    Time  4    Period  Weeks    Status  Achieved      PT SHORT  TERM GOAL #2   Title  Pt will improve 5TSS to </=16.50 secs without UE support in order to indicate decreased fall risk and improved functional strength.      Baseline  10.97 secs without UE support    Time  4    Period  Weeks    Status  Achieved      PT SHORT TERM GOAL #3   Title  Pt will improve FGA to 19/30 in order to indicate decreased fall risk.      Baseline  23/30 on 07/17/17    Period  Weeks    Status  Achieved      PT SHORT TERM GOAL #4   Title  Pt will ambulate at gait speed of >/=2.62 ft/sec with decreased indication of R foot slap/overt gait deviations indicating safe gait  speed.     Baseline  3.53 ft/sec     Time  4    Period  Weeks    Status  Not Met      PT SHORT TERM GOAL #5   Title  Pt will ambulate outdoors over unlevel paved surfaces up to 1000' at mod I level in order to indicate improved community independence.     Baseline  07/18/17, goal met.    Time  4    Period  Weeks    Status  Achieved        PT Long Term Goals - 07/24/17 2059      PT LONG TERM GOAL #1   Title  Pt will be independent with final HEP in order to indicate improved functional mobility and decreased fall risk.  (Target Date: 08/21/17)    Time  8    Period  Weeks    Status  New      PT LONG TERM GOAL #2   Title  Pt will improve 5TSS to </=13.50 secs without UE support in order to indicate improved functional strength and decreased fall risk.     Baseline  10.97 secs without UE support on 07/17/17    Time  8    Period  Weeks    Status  Achieved      PT LONG TERM GOAL #3   Title  Pt will improve FGA to >/=25/30 in order to indicate decreased fall risk.      Time  8    Period  Weeks    Status  Revised      PT LONG TERM GOAL #4   Title  Pt will ambulate >1000' over varying outdoor surfaces, scanning environment at independent level in order to indicate safe community and leisure mobility.     Time  8    Period  Weeks    Status  New      PT LONG TERM GOAL #5   Title  Pt will  improve cervical rotation by 10 deg bilaterally in order to indicate improved safety with driving (when cleared to be without cervical collar).      Time  8    Period  Weeks    Status  New            Plan - 08/14/17 1407    Clinical Impression Statement  Today's skilled session continued to address cervical/upper back tightness. Pt presents with numerous palpable trigger points. Today's session focused on addressing this with manual therapy and with modalities at the end. Pt is hoping to hear back from her Nephrologist about clearance for dry needling to better address her tigger points/muscle tightness. Pt did report feeling better at end of today's session. Pt was also educated on use of the therapy cane for treating her trigger points at home. She plans to purchase one from [employer]. Pt is progressing and should benefit from continued PT to progress toward goals.     Rehab Potential  Good    Clinical Impairments Affecting Rehab Potential  severity of cord compression     PT Frequency  2x / week    PT Duration  8 weeks    PT Treatment/Interventions  ADLs/Self Care Home Management;Electrical Stimulation;Gait training;Stair training;Functional mobility training;Therapeutic activities;Therapeutic exercise;Balance training;Neuromuscular re-education;Patient/family education;Orthotic Fit/Training;Passive range of motion;Manual techniques    PT Next Visit Plan   David-She is nervous for TPDN but she really has one very large trigger point on L upper medial scap  region-likely levator?  I will likely re-cert for another 4 weeks so add her over there for TPDN 1x/wk if you feel appropriate.  Continue to address cervical tightness, all therapy without cervical collar, gentle neck ROM/ isometrics, high level balance and dynamic gait.    Consulted and Agree with Plan of Care  Patient       Patient will benefit from skilled therapeutic intervention in order to improve the following deficits and  impairments:  Abnormal gait, Decreased activity tolerance, Decreased balance, Decreased endurance, Decreased mobility, Decreased range of motion, Decreased strength, Hypomobility, Impaired perceived functional ability, Impaired flexibility, Postural dysfunction, Improper body mechanics, Impaired sensation  Visit Diagnosis: Muscle weakness (generalized)  Cervicalgia     Problem List Patient Active Problem List   Diagnosis Date Noted  . 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 S/P spinal surgery 05/24/2017  . Other secondary kyphosis, cervical region 05/24/2017  . Anxiety 11/21/2016  . Stenosis of cervical spine with myelopathy ([place]) 08/12/2016  . Routine general medical examination at a health care facility 06/01/2016  . Numbness and tingling in both hands 05/18/2015  . Hypothyroidism 03/24/2015  . History of tachycardia 03/24/2015  . HTN (hypertension), benign 06/12/2013  . ESRD on dialysis [facility]) 06/12/2013    Willow Ora, PTA, [facility] [address], [place], [place] [address] [phone] 08/15/17, 7:02 PM   Name: [name] MRN: [id number] Date of Birth: [birth year]

## 2017-08-17 ENCOUNTER — Ambulatory Visit: Payer: Medicare Other | Admitting: Occupational Therapy

## 2017-08-17 ENCOUNTER — Encounter: Payer: Self-pay | Admitting: Physical Therapy

## 2017-08-17 ENCOUNTER — Ambulatory Visit: Payer: Medicare Other | Attending: Internal Medicine | Admitting: Physical Therapy

## 2017-08-17 DIAGNOSIS — M21371 Foot drop, right foot: Secondary | ICD-10-CM

## 2017-08-17 DIAGNOSIS — M542 Cervicalgia: Secondary | ICD-10-CM | POA: Insufficient documentation

## 2017-08-17 DIAGNOSIS — R278 Other lack of coordination: Secondary | ICD-10-CM | POA: Insufficient documentation

## 2017-08-17 DIAGNOSIS — M6281 Muscle weakness (generalized): Secondary | ICD-10-CM

## 2017-08-17 DIAGNOSIS — R2681 Unsteadiness on feet: Secondary | ICD-10-CM | POA: Insufficient documentation

## 2017-08-17 DIAGNOSIS — R2689 Other abnormalities of gait and mobility: Secondary | ICD-10-CM | POA: Insufficient documentation

## 2017-08-17 NOTE — Patient Instructions (Signed)
Thoracic Self-Mobilization (Sitting)    With small rolled towel at upper ribs level, gently lean back until stretch is felt. Hold __10-15__ seconds. Relax. Repeat _5___ times per set. Do __2__ sets per session. Do _1-2__ sessions per day.  http://orth.exer.us/999   Copyright  VHI. All rights reserved.   Pectoralis Stretch With Scapula Adduction: Supine (Towel Roll)    Lie with rolled towel or small pool noodle under spine vertically. Place arms in "T" position with palms facing ceiling.  Hold here for 2 mins.  Do 2-3 times per day.  If you are able to right arm higher, you can.   Do ___ times, ___ times per day.  http://ss.exer.us/357   Copyright  VHI. All rights reserved.    ANKLE: Dorsiflexion (Band)    Sit at edge of surface. Place band around top of foot. Keeping heel on floor, raise toes of banded foot. Hold _2-3__ seconds. Use __yellow______ band. _10__ reps per set, __1-2_ sets per day, _5-7__ days per week    Toe / Heel Raise    Gently rock back on heels and raise toes. Then rock forward on toes and raise heels. Repeat sequence __10__ times per session. Do __5__ sessions per week.  Feet Partial Heel-Toe, Head Motion - Eyes Open    With eyes open, right foot partially in front of the other, move head slowly: up and down 10 times, side to side 10 times.  Switch feet and repeat. Repeat 2 times per session. Do 1 sessions per day.

## 2017-08-17 NOTE — Therapy (Addendum)
[facility] [address] [place], [place], [address] Phone: [phone]   Fax:  [phone]  Physical Therapy Treatment D/C Summary added as an addendum  Patient Details  Name: [name] MRN: [id number] Date of Birth: [birth year] Referring Provider: Ashok Pall, MD   Encounter Date: 08/17/2017  PT End of Session - 08/17/17 2128    Visit Number  17    Number of Visits  17    Date for PT Re-Evaluation  08/21/17    Authorization Type  Medicare    PT Start Time  1318    PT Stop Time  1403    PT Time Calculation (min)  45 min    Activity Tolerance  Patient tolerated treatment well    Behavior During Therapy  [facility] for tasks assessed/performed       Past Medical History:  Diagnosis Date  . Anemia   . Anxiety   . Arthritis   . Claustrophobia   . ESRD (end stage renal disease) on dialysis ([place])    "TTS; [place]" (03/24/2015)  . Glomerulonephritis   . Heart murmur   . HTN (hypertension)   . Hypothyroidism   . Kidney transplant recipient 01/21/2017  . Pancreatitis   . Renal insufficiency   . Secondary hyperparathyroidism ([facility])    [name] 05/11/2014    Past Surgical History:  Procedure Laterality Date  . ANTERIOR CERVICAL CORPECTOMY N/A 05/24/2017   Procedure: CORPECTOMY CERVICAL FIVE- CERVICAL SIX;  Surgeon: Ashok Pall, MD;  Location: [facility];  Service: Neurosurgery;  Laterality: N/A;  . ARTERIOVENOUS GRAFT PLACEMENT Right 1990's?  . ARTERIOVENOUS GRAFT PLACEMENT Left 07/2002   upper arm/notes 06/02/2010  . ARTERIOVENOUS GRAFT PLACEMENT Right 07/2003   upper arm/notes 06/02/2010  . AV FISTULA PLACEMENT Left 09/2000   upper arm/notes 06/02/2010  . BREAST BIOPSY Left unsure   benign  . COLONOSCOPY    . DILATATION & CURRETTAGE/HYSTEROSCOPY WITH RESECTOCOPE N/A 04/17/2013   Procedure: DILATATION & CURETTAGE/HYSTEROSCOPY WITH RESECTOCOPE;  Surgeon: Marvene Staff, MD;  Location: [facility];  Service:  Gynecology;  Laterality: N/A;  . DILATION AND CURETTAGE OF UTERUS    . EYE SURGERY    . KIDNEY TRANSPLANT  1997  . KIDNEY TRANSPLANT  01/21/2017   Dr. Harrington Challenger  . PARATHYROIDECTOMY  03/2000 05/12/2014   w/neck exploration & autotransplantation/notes 06/02/2010; w/neck exploration  . PARATHYROIDECTOMY N/A 05/12/2014   Procedure: PARATHYROIDECTOMY AND NECK EXPLORATION;  Surgeon: Armandina Gemma, MD;  Location: [facility];  Service: General;  Laterality: N/A;  . PERITONEAL CATHETER INSERTION    . PERITONEAL CATHETER REMOVAL  08/1999   [name] 06/02/2010  . POSTERIOR CERVICAL FUSION/FORAMINOTOMY N/A 05/30/2017   Procedure: POSTERIOR CERVICAL Arthrodesis Cervical three - four, cervical four - five, cervical five - six, cervical six - seven;  Surgeon: Ashok Pall, MD;  Location: [facility];  Service: Neurosurgery;  Laterality: N/A;  . POSTERIOR CERVICAL LAMINECTOMY N/A 08/12/2016   Procedure: POSTERIOR CERVICAL LAMINECTOMY CERVICAL THREE CERVICAL FOUR, CERVICAL FOUR CERVICAL FIVE, CERVICAL FIVE- CERVICAL SIX, CERVICAL SIX- CERVICAL SEVEN;  Surgeon: Ashok Pall, MD;  Location: [facility];  Service: Neurosurgery;  Laterality: N/A;  POSTERIOR  . RETINAL DETACHMENT SURGERY Left   . THROMBECTOMY Left 02/2002   fistula/notes 06/02/2010  . THROMBECTOMY / ARTERIOVENOUS GRAFT REVISION  11/2003; 01/2004; 08/07/2005; 08/10/2005; 10/2005   [name] 06/02/2010  . THROMBECTOMY AND REVISION OF ARTERIOVENTOUS (AV) GORETEX  GRAFT Right 01/2002; 11/2003; 01/2004; 08/07/2004; 08/10/2004; 11/13/2005   [name] 05/18/2010; [name] 06/02/2010; /  notes 06/02/2010; [name] 5/16/2012Marland Kitchen [name] 5/16/2012Marland Kitchen [name] 06/02/2010  . THROMBECTOMY AND REVISION OF ARTERIOVENTOUS (AV) GORETEX  GRAFT Left 08/23/2002; 09/13/2002; 07/08/2003   [name] 06/02/2010; [name] 06/02/2010; [name] 06/02/2010    There were no vitals filed for this visit.  Subjective Assessment - 08/17/17 1323    Subjective  Physician said "no" to dry needling due to risk of infection, pt immunocompromised.  No pain,  just usual tightness today.  Still noticing R foot drag    Pertinent History  s/p kidney transplant 01/21/17    Limitations  House hold activities;Walking    How long can you walk comfortably?  Is able to ambulate approx 5-15 mins before she notices R foot dragging.     Patient Stated Goals  "I want to be able to walk without feeling like I"m getting ready to fall."    Currently in Pain?  No/denies         [facility] PT Assessment - 08/17/17 1334      Assessment   Medical Diagnosis  s/p C5/6/7 corpectomy and posterior arthrodesis C3-T1    Referring Provider  Ashok Pall, MD    Onset Date/Surgical Date  05/24/17    Hand Dominance  Right    Prior Therapy  Acute PT/OT      Precautions   Precautions  Cervical    Precaution Comments  per pt:  no lifting restrictions, ok for overhead reach and to begin low range strengthening 07/03/17      Prior Function   Level of Independence  Independent      AROM   Overall AROM   Deficits    Cervical Flexion  35    Cervical Extension  25    Cervical - Right Side Bend  30    Cervical - Left Side Bend  35    Cervical - Right Rotation  35    Cervical - Left Rotation  30      Ambulation/Gait   Ambulation/Gait  Yes    Ambulation/Gait Assistance  6: Modified independent (Device/Increase time)    Ambulation/Gait Assistance Details  mild R foot drag on uneven surfaces outside; maintained balance with head turns in various directions    Ambulation Distance (Feet)  1000 Feet    Assistive device  None    Gait Pattern  Step-through pattern;Poor foot clearance - right    Ambulation Surface  Level;Unlevel;Outdoor;Paved;Gravel;Grass;Other (comment) mulch    Stairs  Yes    Stairs Assistance  6: Modified independent (Device/Increase time)    Stair Management Technique  No rails;Alternating pattern;Forwards    Number of Stairs  8    Height of Stairs  6    Curb  6: Modified independent (Device/increase time)    Gait Comments  on stairs pt reported decreased  sense of control when descending; likely due to R anterior tib weakness and decreased eccentric control      Functional Gait  Assessment   Gait assessed   Yes    Gait Level Surface  Walks 20 ft in less than 5.5 sec, no assistive devices, good speed, no evidence for imbalance, normal gait pattern, deviates no more than 6 in outside of the 12 in walkway width.    Change in Gait Speed  Able to smoothly change walking speed without loss of balance or gait deviation. Deviate no more than 6 in outside of the 12 in walkway width.    Gait with Horizontal Head Turns  Performs head turns smoothly with no change in gait.  Deviates no more than 6 in outside 12 in walkway width    Gait with Vertical Head Turns  Performs head turns with no change in gait. Deviates no more than 6 in outside 12 in walkway width.    Gait and Pivot Turn  Pivot turns safely within 3 sec and stops quickly with no loss of balance.    Step Over Obstacle  Is able to step over 2 stacked shoe boxes taped together (9 in total height) without changing gait speed. No evidence of imbalance.    Gait with Narrow Base of Support  Ambulates 4-7 steps.    Gait with Eyes Closed  Walks 20 ft, uses assistive device, slower speed, mild gait deviations, deviates 6-10 in outside 12 in walkway width. Ambulates 20 ft in less than 9 sec but greater than 7 sec.    Ambulating Backwards  Walks 20 ft, uses assistive device, slower speed, mild gait deviations, deviates 6-10 in outside 12 in walkway width.    Steps  Alternating feet, no rail.    Total Score  26    FGA comment:  26/30 low falls risk                           PT Education - 08/17/17 2126    Education Details  progress towards goals; areas to continue to focus on in therapy, use of Bioness for R foot drop, possible limitation in the amount of cervical ROM that can be recovered due to arthrodesis of multiple levels of cervical spine    Person(s) Educated  Patient    Methods   Explanation    Comprehension  Verbalized understanding       PT Short Term Goals - 07/31/17 1451      PT SHORT TERM GOAL #1   Title  Pt will be independent with initial HEP in order to indicate improved functional mobility and decreased fall risk.  (Target Date: 07/22/17)    Baseline  07/18/17: per pt report    Time  4    Period  Weeks    Status  Achieved      PT SHORT TERM GOAL #2   Title  Pt will improve 5TSS to </=16.50 secs without UE support in order to indicate decreased fall risk and improved functional strength.      Baseline  10.97 secs without UE support    Time  4    Period  Weeks    Status  Achieved      PT SHORT TERM GOAL #3   Title  Pt will improve FGA to 19/30 in order to indicate decreased fall risk.      Baseline  23/30 on 07/17/17    Period  Weeks    Status  Achieved      PT SHORT TERM GOAL #4   Title  Pt will ambulate at gait speed of >/=2.62 ft/sec with decreased indication of R foot slap/overt gait deviations indicating safe gait speed.     Baseline  3.53 ft/sec     Time  4    Period  Weeks    Status  Not Met      PT SHORT TERM GOAL #5   Title  Pt will ambulate outdoors over unlevel paved surfaces up to 1000' at mod I level in order to indicate improved community independence.     Baseline  07/18/17, goal met.    Time  4  Period  Weeks    Status  Achieved        PT Long Term Goals - 08/17/17 2125      PT LONG TERM GOAL #1   Title  Pt will be independent with final HEP in order to indicate improved functional mobility and decreased fall risk.  (Target Date: 08/21/17)    Baseline  met with current HEP    Time  8    Period  Weeks    Status  Achieved      PT LONG TERM GOAL #2   Title  Pt will improve 5TSS to </=13.50 secs without UE support in order to indicate improved functional strength and decreased fall risk.     Baseline  10.97 secs without UE support on 07/17/17    Time  8    Period  Weeks    Status  Achieved      PT LONG TERM GOAL #3    Title  Pt will improve FGA to >/=25/30 in order to indicate decreased fall risk.      Baseline  26/30    Time  8    Period  Weeks    Status  Achieved      PT LONG TERM GOAL #4   Title  Pt will ambulate >1000' over varying outdoor surfaces, scanning environment at independent level in order to indicate safe community and leisure mobility.     Time  8    Period  Weeks    Status  Achieved      PT LONG TERM GOAL #5   Title  Pt will improve cervical rotation by 10 deg bilaterally in order to indicate improved safety with driving (when cleared to be without cervical collar).      Baseline  No change in ROM     Time  8    Period  Weeks    Status  Not Met      New PT goals: PT Short Term Goals - 08/17/17 2139      PT SHORT TERM GOAL #1   Title  = LTG      PT Long Term Goals - 08/17/17 2139      PT LONG TERM GOAL #1   Title  Pt will be independent with final HEP in order to indicate improved functional mobility and decreased fall risk.     Time  4    Period  Weeks    Status  Revised    Target Date  09/20/17      PT LONG TERM GOAL #2   Title  Pt will tolerate set up and use of Bioness functional electrical stimulation for DF assist on RLE    Time  4    Period  Weeks    Status  New    Target Date  09/20/17      PT LONG TERM GOAL #3   Title  Pt will improve FGA to >/=28/30 in order to indicate decreased fall risk.      Baseline  26/30    Time  4    Period  Weeks    Status  Revised    Target Date  09/20/17      PT LONG TERM GOAL #4   Title  Pt will ambulate >1000' over varying outdoor surfaces with decreased incidence of R foot slap/drag and increased foot clearance to indicate safer ambulation in community    Time  4    Period  Weeks  Status  New    Target Date  09/20/17      PT LONG TERM GOAL #5   Title  Pt will improve cervical rotation by 10 deg bilaterally in order to indicate improved safety with driving      Baseline  No change in ROM     Time  4    Period   Weeks    Status  Revised    Target Date  09/20/17             Plan - 08/17/17 2128    Clinical Impression Statement  Treatment session focused on assessment of progress towards LTG.  Pt is making good progress and has met 4/5 LTG.  Pt demonstrates improvement in functional LE strength, dynamic balance and decreased falls risk during gait community distances and with higher level challenges.  Pt did not meet cervical spine ROM goal and continues to present with significantly limited AROM as well as ongoing weakness and R foot drop affecting safety with gait over compliant and uneven surfaces and stability when descending stairs/curbs.  Pt is not cleared for dry needling but would benefit from ongoing skilled PT services to continue to address these impairments to maximize functional mobility independence and decrease falls risk.    Rehab Potential  Good    Clinical Impairments Affecting Rehab Potential  severity of cord compression     PT Frequency  2x / week    PT Duration  4 weeks    PT Treatment/Interventions  ADLs/Self Care Home Management;Electrical Stimulation;Gait training;Stair training;Functional mobility training;Therapeutic activities;Therapeutic exercise;Balance training;Neuromuscular re-education;Patient/family education;Orthotic Fit/Training;Passive range of motion;Manual techniques    PT Next Visit Plan  MD SAID NO TO DRY NEEDLING; Incorporate balance and R DF strengthening to address foot drop and stability when descending stairs - Bioness?  Continue to address cervical tightness, all therapy without cervical collar, gentle neck ROM/ isometrics, high level balance and dynamic gait on compliant surfaces    Consulted and Agree with Plan of Care  Patient       Patient will benefit from skilled therapeutic intervention in order to improve the following deficits and impairments:  Abnormal gait, Decreased activity tolerance, Decreased balance, Decreased endurance, Decreased mobility,  Decreased range of motion, Decreased strength, Hypomobility, Impaired perceived functional ability, Impaired flexibility, Postural dysfunction, Improper body mechanics, Impaired sensation  Visit Diagnosis: Muscle weakness (generalized)  Cervicalgia  Unsteadiness on feet  Other abnormalities of gait and mobility  Foot drop, right     Problem List Patient Active Problem List   Diagnosis Date Noted  . 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 S/P spinal surgery 05/24/2017  . Other secondary kyphosis, cervical region 05/24/2017  . Anxiety 11/21/2016  . Stenosis of cervical spine with myelopathy ([facility]) 08/12/2016  . Routine general medical examination at a health care facility 06/01/2016  . Numbness and tingling in both hands 05/18/2015  . Hypothyroidism 03/24/2015  . History of tachycardia 03/24/2015  . HTN (hypertension), benign 06/12/2013  . ESRD on dialysis [facility]) 06/12/2013   Rico Junker, PT, DPT 08/17/17    9:37 PM  Addendum:  Following this treatment pt hospitalized for lower GI bleed.    PHYSICAL THERAPY DISCHARGE SUMMARY  Visits from Start of Care: 17  Current functional level related to goals / functional outcomes: Pt was making excellent progress and had met 4/5 LTG with plan to recertify for more visits; following this visit pt was admitted to hospital due to lower GI bleed.  Will D/C pt and  will need new order for PT in order to resume therapy services once pt is stable and cleared for activity.   Remaining deficits: Decreased cervical ROM, LE weakness, impaired balance   Education / Equipment: HEP  Plan: Patient agrees to discharge.  Patient goals were partially met. Patient is being discharged due to a change in medical status.  ?????    Rico Junker, PT, DPT 10/11/17    9:11 PM    [facility] [address] [place], [place], [address] Phone: [phone]   Fax:  [phone]  Name: KISSA CAMPOY MRN: [id number] Date of Birth: 1960-12-18

## 2017-08-17 NOTE — Therapy (Signed)
[facility] [address] [place], [place], [address] Phone: [phone]   Fax:  [phone]  Occupational Therapy Treatment  Patient Details  Name: [name] MRN: [id number] Date of Birth: 1960/10/30 Referring Provider: Ashok Pall, MD   Encounter Date: 08/17/2017  OT End of Session - 08/17/17 1448    Visit Number  15    Number of Visits  17    Date for OT Re-Evaluation  09/20/17    Authorization Type  Medicare $3000 visit limt will need PN every 10th visit    Authorization Time Period  29 day cert. period  (through 09/20/17)    Authorization - Visit Number  15    Authorization - Number of Visits  20    OT Start Time  1400    OT Stop Time  1445    OT Time Calculation (min)  45 min    Activity Tolerance  Patient tolerated treatment well    Behavior During Therapy  WFL for tasks assessed/performed       Past Medical History:  Diagnosis Date  . Anemia   . Anxiety   . Arthritis   . Claustrophobia   . ESRD (end stage renal disease) on dialysis ([facility])    "TTS; [facility]" (03/24/2015)  . Glomerulonephritis   . Heart murmur   . HTN (hypertension)   . Hypothyroidism   . Kidney transplant recipient 01/21/2017  . Pancreatitis   . Renal insufficiency   . Secondary hyperparathyroidism ([facility])    [name] 05/11/2014    Past Surgical History:  Procedure Laterality Date  . ANTERIOR CERVICAL CORPECTOMY N/A 05/24/2017   Procedure: CORPECTOMY CERVICAL FIVE- CERVICAL SIX;  Surgeon: Ashok Pall, MD;  Location: [facility];  Service: Neurosurgery;  Laterality: N/A;  . ARTERIOVENOUS GRAFT PLACEMENT Right 1990's?  . ARTERIOVENOUS GRAFT PLACEMENT Left 07/2002   upper arm/notes 06/02/2010  . ARTERIOVENOUS GRAFT PLACEMENT Right 07/2003   upper arm/notes 06/02/2010  . AV FISTULA PLACEMENT Left 09/2000   upper arm/notes 06/02/2010  . BREAST BIOPSY Left unsure   benign  . COLONOSCOPY    . DILATATION & CURRETTAGE/HYSTEROSCOPY  WITH RESECTOCOPE N/A 04/17/2013   Procedure: DILATATION & CURETTAGE/HYSTEROSCOPY WITH RESECTOCOPE;  Surgeon: Marvene Staff, MD;  Location: [facility];  Service: Gynecology;  Laterality: N/A;  . DILATION AND CURETTAGE OF UTERUS    . EYE SURGERY    . KIDNEY TRANSPLANT  1997  . KIDNEY TRANSPLANT  01/21/2017   Dr. Harrington Challenger  . PARATHYROIDECTOMY  03/2000 05/12/2014   w/neck exploration & autotransplantation/notes 06/02/2010; w/neck exploration  . PARATHYROIDECTOMY N/A 05/12/2014   Procedure: PARATHYROIDECTOMY AND NECK EXPLORATION;  Surgeon: Armandina Gemma, MD;  Location: [facility];  Service: General;  Laterality: N/A;  . PERITONEAL CATHETER INSERTION    . PERITONEAL CATHETER REMOVAL  08/1999   [name] 06/02/2010  . POSTERIOR CERVICAL FUSION/FORAMINOTOMY N/A 05/30/2017   Procedure: POSTERIOR CERVICAL Arthrodesis Cervical three - four, cervical four - five, cervical five - six, cervical six - seven;  Surgeon: Ashok Pall, MD;  Location: [facility];  Service: Neurosurgery;  Laterality: N/A;  . POSTERIOR CERVICAL LAMINECTOMY N/A 08/12/2016   Procedure: POSTERIOR CERVICAL LAMINECTOMY CERVICAL THREE CERVICAL FOUR, CERVICAL FOUR CERVICAL FIVE, CERVICAL FIVE- CERVICAL SIX, CERVICAL SIX- CERVICAL SEVEN;  Surgeon: Ashok Pall, MD;  Location: [facility];  Service: Neurosurgery;  Laterality: N/A;  POSTERIOR  . RETINAL DETACHMENT SURGERY Left   . THROMBECTOMY Left 02/2002   fistula/notes 06/02/2010  . THROMBECTOMY / ARTERIOVENOUS  GRAFT REVISION  11/2003; 01/2004; 08/07/2005; 08/10/2005; 10/2005   [name] 06/02/2010  . THROMBECTOMY AND REVISION OF ARTERIOVENTOUS (AV) GORETEX  GRAFT Right 01/2002; 11/2003; 01/2004; 08/07/2004; 08/10/2004; 11/13/2005   [name] 5/1/2012Marland Kitchen [name] 5/16/2012Marland Kitchen [name] 5/16/2012Marland Kitchen [name] 5/16/2012Marland Kitchen [name] 06/02/2010; [name] 06/02/2010  . THROMBECTOMY AND REVISION OF ARTERIOVENTOUS (AV) GORETEX  GRAFT Left 08/23/2002; 09/13/2002; 07/08/2003   [name] 06/02/2010; [name] 06/02/2010; [name] 06/02/2010    There were no vitals filed for  this visit.  Subjective Assessment - 08/17/17 1402    Subjective   I feel like I'm doing good    Pertinent History  s/p C5/6/7 anterior corpectomy (with strut graft and anterior plating) and posterior C3-T1 arthrodesis on 05/24/17.  Pt with PMH that includes:  Arthritis, ESRD, s/p kidney transplant 01/21/17, HTN, Hypotyroidism, heart murmur, anxiety, history of C3-C6 posterior decompression 2018    Limitations  **no lifting > 5 lbs on RUE. Caution with resistive activities in shoulder extension RUE due to graft; Pt reports that she called MD office regarding restrictions and reports that she may begin low-range strengthening, ok to perform overhead reaching    Patient Stated Goals  improve coordination/strength UEs, return to work    Currently in Pain?  No/denies                   OT Treatments/Exercises (OP) - 08/17/17 0001      ADLs   ADL Comments  Assessed remaining goals and progress to date. Pt has met all STG's/LTG's and denies any problems with ADLS/IADLS. Pt does demo some bilateral sh. weakness however does not impact her functionally - pt reports she is still able to put dishes, groceries away overhead, etc.       Shoulder Exercises: ROM/Strengthening   UBE (Upper Arm Bike)  UBE x 5 min. level 3 for UE strength/endurance    Other ROM/Strengthening Exercises  Verbally reviewed previously issued HEP for UE ROM/strength - pt instructed how to modify resistive sh. extension ex for RUE d/t active fistula    Other ROM/Strengthening Exercises  Seated: BUE sh. flexion holding 3 lb. weight (palms facing each other) x 10 reps with min compensations noted if going too high - pt hiking shoulders activating upper traps. Pt instructed to go a little lower and watch posture and positioning - pt able to control better w/ verbal and visual feedback. Recommended pt doing lying down a little higher, and sitting up to just above eye level.       Neurological Re-education Exercises   Other  Exercises 1  Seated on physioball to work on core and pelvic control: slow leg lifts each side w/ cues to activate side, pt able to control better after v.c's. Pt does shift hips to Lt and had to focus to maintain neutral pelvic position. Pt then progressed to UE ROM/strength while on physioball with BUE sh. flex holding 1 lb. weights in each hand w/ mod dififculty d/t proximal sh. weakness.                OT Short Term Goals - 08/17/17 1440      OT SHORT TERM GOAL #1   Title  Pt will be independent with initial HEP.--check STGs 07/22/17    Status  Achieved      OT SHORT TERM GOAL #2   Title  Pt will improve coordination for ADLs as shown by completing 9-hole peg test in less than 22sec with R hand and 24sec with L hand.     Baseline  R-24.44, L-29.47sec    Status  Achieved Rt = 22.07 sec. Lt = 22.78 sec      OT SHORT TERM GOAL #3   Title  Pt will be able to perform overhead reaching to retrieve light object without compensation.    Baseline  75% with compensation    Status  Achieved      OT SHORT TERM GOAL #4   Title  Pt will improve bilateral grip strength by at least 5lbs to assist with ADLs (opening containers, gripping).    Status  Achieved R-40, L-47lbs      OT SHORT TERM GOAL #5   Title  Pt will improve lateral pinch strength by at least 4lbs bilaterally for work tasks/ADLs.    Status  Achieved        OT Long Term Goals - 08/17/17 1441      OT LONG TERM GOAL #1   Title  Pt will be independent with updated HEP.--check LTGs 08/22/17    Time  12    Period  Weeks    Status  Achieved      OT LONG TERM GOAL #2   Title  Pt will be able to retrieve at least 2lb object from overhead shelf with each UE safely.    Time  12    Period  Weeks    Status  Achieved w/ min compensations into sh. hiking      OT LONG TERM GOAL #3   Title  Pt will perform simulated fine motor work tasks (typing, writing, preparing slides, etc) with min difficulty/sufficient for return to work.     Time  12    Period  Weeks    Status  Achieved      OT LONG TERM GOAL #4   Title  Pt will improve 3point pinch strength by at least 4lbs bilaterally for work tasks/ADLs.    Baseline  R-7bs, L-6lbs    Time  12    Period  Weeks    Status  Achieved R-12 lbs, L-10.5 lbs      OT LONG TERM GOAL #5   Title  Pt will improve bilateral grip strength by at least 10lbs to assist with ADLs (opening containers, gripping).    Baseline  R-35lbs, L-32lbs    Time  12    Period  Weeks    Status  Achieved met on Lt.  Rt = 48 lbs, Lt = 50 lbs      OT LONG TERM GOAL #6   Title  Pt will be able to perform mod complex IADLs (vacuuming, carrying heavier groceries, etc).  mod I.    Time  12    Period  Weeks    Status  Achieved            Plan - 08/17/17 1442    Clinical Impression Statement  Pt has met all goals and reports doing all ADLS, IADLS without problems. Pt does not anticipate any problems with return to work. Pt still compensates into sh. hiking with overhead reaching and demo some proximal weakness, but some of this is premorbid (from years of dialysis, and fistula placement) and is not effecting functional tasks.     OT Treatment/Interventions  Self-care/ADL training;Therapeutic exercise;Moist Heat;Electrical Stimulation;Fluidtherapy;Paraffin;Neuromuscular education;Splinting;Patient/family education;Energy conservation;Therapist, nutritional;Therapeutic activities;Cryotherapy;Ultrasound;DME and/or AE instruction;Manual Therapy;Passive range of motion;Balance training    Plan  D/C O.Donnajean Lopes and Agree with Plan of Care  Patient       Patient will  benefit from skilled therapeutic intervention in order to improve the following deficits and impairments:  Decreased knowledge of use of DME, Pain, Decreased coordination, Decreased mobility, Impaired sensation, Decreased strength, Decreased range of motion, Decreased endurance, Decreased activity tolerance, Decreased balance, Decreased  knowledge of precautions, Impaired perceived functional ability, Impaired UE functional use  Visit Diagnosis: Muscle weakness (generalized)  Other lack of coordination    Problem List Patient Active Problem List   Diagnosis Date Noted  . 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 S/P spinal surgery 05/24/2017  . Other secondary kyphosis, cervical region 05/24/2017  . Anxiety 11/21/2016  . Stenosis of cervical spine with myelopathy ([facility]) 08/12/2016  . Routine general medical examination at a health care facility 06/01/2016  . Numbness and tingling in both hands 05/18/2015  . Hypothyroidism 03/24/2015  . History of tachycardia 03/24/2015  . HTN (hypertension), benign 06/12/2013  . ESRD on dialysis ([facility]) 06/12/2013    OCCUPATIONAL THERAPY DISCHARGE SUMMARY  Visits from Start of Care: 15  Current functional level related to goals / functional outcomes: See above - pt has met all goals   Remaining deficits: Bilateral shoulder weakness (not impacting function)   Education / Equipment: HEP's  Plan: Patient agrees to discharge.  Patient goals were met. Patient is being discharged due to meeting the stated rehab goals.  ?????       Carey Bullocks, OTR/L  08/17/2017, 4:29 PM  [facility] [address] [place], [place], [address] Phone: [phone]   Fax:  [phone]  Name: [name] MRN: [id number] Date of Birth: 08/06/1960

## 2017-08-20 ENCOUNTER — Encounter (HOSPITAL_COMMUNITY): Payer: Self-pay

## 2017-08-20 ENCOUNTER — Inpatient Hospital Stay (HOSPITAL_COMMUNITY)
Admission: EM | Admit: 2017-08-20 | Discharge: 2017-08-23 | DRG: 378 | Disposition: A | Discharge status: Home / Self Care | Payer: Medicare Other | Attending: Internal Medicine | Admitting: Internal Medicine

## 2017-08-20 DIAGNOSIS — Z7952 Long term (current) use of systemic steroids: Secondary | ICD-10-CM

## 2017-08-20 DIAGNOSIS — Z981 Arthrodesis status: Secondary | ICD-10-CM | POA: Diagnosis not present

## 2017-08-20 DIAGNOSIS — Z87891 Personal history of nicotine dependence: Secondary | ICD-10-CM | POA: Diagnosis not present

## 2017-08-20 DIAGNOSIS — K921 Melena: Secondary | ICD-10-CM | POA: Diagnosis not present

## 2017-08-20 DIAGNOSIS — N2581 Secondary hyperparathyroidism of renal origin: Secondary | ICD-10-CM | POA: Diagnosis present

## 2017-08-20 DIAGNOSIS — I959 Hypotension, unspecified: Secondary | ICD-10-CM | POA: Diagnosis not present

## 2017-08-20 DIAGNOSIS — R52 Pain, unspecified: Secondary | ICD-10-CM | POA: Diagnosis not present

## 2017-08-20 DIAGNOSIS — Z91041 Radiographic dye allergy status: Secondary | ICD-10-CM | POA: Diagnosis not present

## 2017-08-20 DIAGNOSIS — R58 Hemorrhage, not elsewhere classified: Secondary | ICD-10-CM | POA: Diagnosis not present

## 2017-08-20 DIAGNOSIS — K922 Gastrointestinal hemorrhage, unspecified: Secondary | ICD-10-CM

## 2017-08-20 DIAGNOSIS — D62 Acute posthemorrhagic anemia: Secondary | ICD-10-CM | POA: Diagnosis present

## 2017-08-20 DIAGNOSIS — R000 Tachycardia, unspecified: Secondary | ICD-10-CM | POA: Diagnosis present

## 2017-08-20 DIAGNOSIS — Z888 Allergy status to other drugs, medicaments and biological substances status: Secondary | ICD-10-CM

## 2017-08-20 DIAGNOSIS — K5791 Diverticulosis of intestine, part unspecified, without perforation or abscess with bleeding: Principal | ICD-10-CM | POA: Diagnosis present

## 2017-08-20 DIAGNOSIS — Z940 Kidney transplant status: Secondary | ICD-10-CM | POA: Diagnosis not present

## 2017-08-20 DIAGNOSIS — E039 Hypothyroidism, unspecified: Secondary | ICD-10-CM | POA: Diagnosis present

## 2017-08-20 DIAGNOSIS — F40240 Claustrophobia: Secondary | ICD-10-CM | POA: Diagnosis present

## 2017-08-20 DIAGNOSIS — I10 Essential (primary) hypertension: Secondary | ICD-10-CM | POA: Diagnosis not present

## 2017-08-20 DIAGNOSIS — Z79899 Other long term (current) drug therapy: Secondary | ICD-10-CM | POA: Diagnosis not present

## 2017-08-20 DIAGNOSIS — Z792 Long term (current) use of antibiotics: Secondary | ICD-10-CM | POA: Diagnosis not present

## 2017-08-20 DIAGNOSIS — R402250 Coma scale, best verbal response, oriented, unspecified time: Secondary | ICD-10-CM | POA: Diagnosis present

## 2017-08-20 DIAGNOSIS — Z886 Allergy status to analgesic agent status: Secondary | ICD-10-CM

## 2017-08-20 DIAGNOSIS — K5790 Diverticulosis of intestine, part unspecified, without perforation or abscess without bleeding: Secondary | ICD-10-CM | POA: Diagnosis not present

## 2017-08-20 DIAGNOSIS — R42 Dizziness and giddiness: Secondary | ICD-10-CM | POA: Diagnosis not present

## 2017-08-20 DIAGNOSIS — F419 Anxiety disorder, unspecified: Secondary | ICD-10-CM | POA: Diagnosis not present

## 2017-08-20 DIAGNOSIS — N183 Chronic kidney disease, stage 3 (moderate): Secondary | ICD-10-CM | POA: Diagnosis present

## 2017-08-20 DIAGNOSIS — N250 Renal osteodystrophy: Secondary | ICD-10-CM | POA: Diagnosis present

## 2017-08-20 DIAGNOSIS — Z885 Allergy status to narcotic agent status: Secondary | ICD-10-CM | POA: Diagnosis not present

## 2017-08-20 DIAGNOSIS — D649 Anemia, unspecified: Secondary | ICD-10-CM | POA: Diagnosis not present

## 2017-08-20 DIAGNOSIS — K5730 Diverticulosis of large intestine without perforation or abscess without bleeding: Secondary | ICD-10-CM | POA: Diagnosis not present

## 2017-08-20 DIAGNOSIS — I129 Hypertensive chronic kidney disease with stage 1 through stage 4 chronic kidney disease, or unspecified chronic kidney disease: Secondary | ICD-10-CM | POA: Diagnosis present

## 2017-08-20 DIAGNOSIS — R402360 Coma scale, best motor response, obeys commands, unspecified time: Secondary | ICD-10-CM | POA: Diagnosis present

## 2017-08-20 DIAGNOSIS — R402140 Coma scale, eyes open, spontaneous, unspecified time: Secondary | ICD-10-CM | POA: Diagnosis present

## 2017-08-20 HISTORY — DX: Acute posthemorrhagic anemia: D62

## 2017-08-20 HISTORY — DX: Diverticulosis of intestine, part unspecified, without perforation or abscess without bleeding: K57.90

## 2017-08-20 HISTORY — DX: Gastrointestinal hemorrhage, unspecified: K92.2

## 2017-08-20 LAB — I-STAT CHEM 8, ED
BUN: 21 mg/dL — AB (ref 6–20)
CREATININE: 1.2 mg/dL — AB (ref 0.44–1.00)
Calcium, Ion: 1.26 mmol/L (ref 1.15–1.40)
Chloride: 108 mmol/L (ref 98–111)
Glucose, Bld: 125 mg/dL — ABNORMAL HIGH (ref 70–99)
HCT: 27 % — ABNORMAL LOW (ref 36.0–46.0)
HEMOGLOBIN: 9.2 g/dL — AB (ref 12.0–15.0)
POTASSIUM: 4.7 mmol/L (ref 3.5–5.1)
Sodium: 133 mmol/L — ABNORMAL LOW (ref 135–145)
TCO2: 18 mmol/L — ABNORMAL LOW (ref 22–32)

## 2017-08-20 LAB — CBC
HCT: 27.5 % — ABNORMAL LOW (ref 36.0–46.0)
Hemoglobin: 8.3 g/dL — ABNORMAL LOW (ref 12.0–15.0)
MCH: 29.6 pg (ref 26.0–34.0)
MCHC: 30.2 g/dL (ref 30.0–36.0)
MCV: 98.2 fL (ref 78.0–100.0)
PLATELETS: 216 10*3/uL (ref 150–400)
RBC: 2.8 MIL/uL — AB (ref 3.87–5.11)
RDW: 14 % (ref 11.5–15.5)
WBC: 7.7 10*3/uL (ref 4.0–10.5)

## 2017-08-20 LAB — CBC WITH DIFFERENTIAL/PLATELET
Abs Immature Granulocytes: 0.3 10*3/uL — ABNORMAL HIGH (ref 0.0–0.1)
Basophils Absolute: 0.1 10*3/uL (ref 0.0–0.1)
Basophils Relative: 1 %
EOS ABS: 0.1 10*3/uL (ref 0.0–0.7)
EOS PCT: 1 %
HEMATOCRIT: 30.7 % — AB (ref 36.0–46.0)
Hemoglobin: 9 g/dL — ABNORMAL LOW (ref 12.0–15.0)
IMMATURE GRANULOCYTES: 3 %
LYMPHS ABS: 0.4 10*3/uL — AB (ref 0.7–4.0)
Lymphocytes Relative: 4 %
MCH: 29.6 pg (ref 26.0–34.0)
MCHC: 29.3 g/dL — AB (ref 30.0–36.0)
MCV: 101 fL — AB (ref 78.0–100.0)
MONOS PCT: 12 %
Monocytes Absolute: 1.2 10*3/uL — ABNORMAL HIGH (ref 0.1–1.0)
Neutro Abs: 8 10*3/uL — ABNORMAL HIGH (ref 1.7–7.7)
Neutrophils Relative %: 79 %
Platelets: 243 10*3/uL (ref 150–400)
RBC: 3.04 MIL/uL — ABNORMAL LOW (ref 3.87–5.11)
RDW: 14.1 % (ref 11.5–15.5)
WBC: 10 10*3/uL (ref 4.0–10.5)

## 2017-08-20 LAB — PREPARE RBC (CROSSMATCH)

## 2017-08-20 LAB — HEMOGLOBIN AND HEMATOCRIT, BLOOD
HCT: 29 % — ABNORMAL LOW (ref 36.0–46.0)
HEMOGLOBIN: 9 g/dL — AB (ref 12.0–15.0)

## 2017-08-20 LAB — COMPREHENSIVE METABOLIC PANEL
ALBUMIN: 3.6 g/dL (ref 3.5–5.0)
ALT: 9 U/L (ref 0–44)
AST: 21 U/L (ref 15–41)
Alkaline Phosphatase: 80 U/L (ref 38–126)
Anion gap: 8 (ref 5–15)
BUN: 22 mg/dL — AB (ref 6–20)
CHLORIDE: 107 mmol/L (ref 98–111)
CO2: 18 mmol/L — ABNORMAL LOW (ref 22–32)
CREATININE: 1.27 mg/dL — AB (ref 0.44–1.00)
Calcium: 9.7 mg/dL (ref 8.9–10.3)
GFR calc Af Amer: 54 mL/min — ABNORMAL LOW (ref >60)
GFR calc non Af Amer: 46 mL/min — ABNORMAL LOW (ref >60)
Glucose, Bld: 126 mg/dL — ABNORMAL HIGH (ref 70–99)
Potassium: 4.9 mmol/L (ref 3.5–5.1)
Sodium: 133 mmol/L — ABNORMAL LOW (ref 135–145)
Total Bilirubin: 0.6 mg/dL (ref 0.3–1.2)
Total Protein: 6.6 g/dL (ref 6.5–8.1)

## 2017-08-20 MED ORDER — SODIUM CHLORIDE 0.9% IV SOLUTION
Freq: Once | INTRAVENOUS | Status: AC
  Administered 2017-08-20: 10:00:00 via INTRAVENOUS

## 2017-08-20 MED ORDER — TACROLIMUS 1 MG PO CAPS
3.0000 mg | ORAL_CAPSULE | Freq: Two times a day (BID) | ORAL | Status: DC
  Administered 2017-08-20 – 2017-08-23 (×7): 3 mg via ORAL
  Filled 2017-08-20 (×8): qty 3

## 2017-08-20 MED ORDER — SODIUM CHLORIDE 0.9 % IV BOLUS
500.0000 mL | Freq: Once | INTRAVENOUS | Status: AC
  Administered 2017-08-20: 500 mL via INTRAVENOUS

## 2017-08-20 MED ORDER — OXYCODONE HCL 5 MG PO TABS
5.0000 mg | ORAL_TABLET | ORAL | Status: DC | PRN
  Administered 2017-08-20 – 2017-08-21 (×3): 10 mg via ORAL
  Administered 2017-08-22: 5 mg via ORAL
  Filled 2017-08-20 (×3): qty 2
  Filled 2017-08-20: qty 1

## 2017-08-20 MED ORDER — ONDANSETRON HCL 4 MG PO TABS: 4.0000 mg | ORAL_TABLET | Freq: Four times a day (QID) | ORAL | Status: DC | PRN

## 2017-08-20 MED ORDER — MYCOPHENOLATE SODIUM 180 MG PO TBEC
720.0000 mg | DELAYED_RELEASE_TABLET | Freq: Two times a day (BID) | ORAL | Status: DC
  Administered 2017-08-20 – 2017-08-23 (×7): 720 mg via ORAL
  Filled 2017-08-20 (×8): qty 4

## 2017-08-20 MED ORDER — PREDNISONE 5 MG PO TABS
7.5000 mg | ORAL_TABLET | Freq: Every morning | ORAL | Status: DC
  Administered 2017-08-20 – 2017-08-23 (×4): 7.5 mg via ORAL
  Filled 2017-08-20 (×2): qty 2
  Filled 2017-08-20: qty 1
  Filled 2017-08-20: qty 2

## 2017-08-20 MED ORDER — FAMOTIDINE 20 MG PO TABS
20.0000 mg | ORAL_TABLET | Freq: Every day | ORAL | Status: DC
  Administered 2017-08-20 – 2017-08-22 (×3): 20 mg via ORAL
  Filled 2017-08-20 (×3): qty 1

## 2017-08-20 MED ORDER — SODIUM CHLORIDE 0.9 % IV SOLN
INTRAVENOUS | Status: DC
  Administered 2017-08-20 – 2017-08-22 (×2): via INTRAVENOUS

## 2017-08-20 MED ORDER — ALPRAZOLAM 0.25 MG PO TABS
0.2500 mg | ORAL_TABLET | Freq: Every day | ORAL | Status: DC | PRN
Start: 2017-08-20 — End: 2017-08-23

## 2017-08-20 MED ORDER — SODIUM BICARBONATE 650 MG PO TABS
650.0000 mg | ORAL_TABLET | Freq: Every day | ORAL | Status: DC
  Administered 2017-08-20 – 2017-08-23 (×4): 650 mg via ORAL
  Filled 2017-08-20 (×4): qty 1

## 2017-08-20 MED ORDER — TRAZODONE HCL 50 MG PO TABS
25.0000 mg | ORAL_TABLET | Freq: Every evening | ORAL | Status: DC | PRN
  Administered 2017-08-20 – 2017-08-22 (×3): 25 mg via ORAL
  Filled 2017-08-20 (×3): qty 1

## 2017-08-20 MED ORDER — ONDANSETRON HCL 4 MG/2ML IJ SOLN
4.0000 mg | Freq: Four times a day (QID) | INTRAMUSCULAR | Status: DC | PRN
  Administered 2017-08-20 – 2017-08-22 (×3): 4 mg via INTRAVENOUS
  Filled 2017-08-20 (×3): qty 2

## 2017-08-20 MED ORDER — SODIUM CHLORIDE 0.9% IV SOLUTION: Freq: Once | INTRAVENOUS | Status: DC

## 2017-08-20 MED ORDER — SULFAMETHOXAZOLE-TRIMETHOPRIM 400-80 MG PO TABS
1.0000 | ORAL_TABLET | ORAL | Status: DC
  Administered 2017-08-21 – 2017-08-23 (×2): 1 via ORAL
  Filled 2017-08-20 (×2): qty 1

## 2017-08-20 NOTE — ED Provider Notes (Signed)
[place] EMERGENCY DEPARTMENT Provider Note   CSN: [id number] Arrival date & time: 08/20/17  6144     History   Chief Complaint Chief Complaint  Patient presents with  . GI Bleeding    HPI [name] is a 57 y.o. female.  The history is provided by the patient. No language interpreter was used.    [name] is a 57 y.o. female who presents to the Emergency Department complaining of rectal bleeding. This morning she developed four episodes of diarrhea. Stools initially were maroon and she thought it was related to eating pizza earlier in the week. The last bowel movement was bright red blood and she felt lightheaded when she got off the commode. She denies any fevers, chest pain, shortness of breath, nausea, vomiting, abdominal pain. She has a history of renal transplant on January 5 at [facility]. She has no history of G.I. bleeding and takes no blood thinners. She did have a colonoscopy in 2015 that noted severe diverticulosis.    Renal transplant at [facility] Jan 21, 2017 [phone] [name] - transplant cordinator, Dr. Jenita Seashore Past Medical History:  Diagnosis Date  . Acute blood loss anemia   . Anemia   . Anxiety   . Arthritis   . Claustrophobia   . Diverticulosis   . ESRD (end stage renal disease) on dialysis ([place])    "TTS; [place]" (03/24/2015)  . GI bleed   . Glomerulonephritis   . Heart murmur   . HTN (hypertension)   . Hypothyroidism   . Kidney transplant recipient 01/21/2017  . Pancreatitis   . Renal insufficiency   . Secondary hyperparathyroidism ([place])    [name] 05/11/2014    Patient Active Problem List   Diagnosis Date Noted  . Melena 08/20/2017  . Lower GI bleed   . Acute blood loss anemia   . Diverticulosis   . 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 S/P spinal surgery 05/24/2017  . Other secondary kyphosis, cervical region 05/24/2017  . Kidney transplant recipient 01/21/2017  . Anxiety 11/21/2016   . 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 Routine general medical examination at a health care facility 06/01/2016  . Numbness and tingling in both hands 05/18/2015  . Hypothyroidism 03/24/2015  . History of tachycardia 03/24/2015  . HTN (hypertension), benign 06/12/2013  . ESRD on dialysis [facility]) 06/12/2013    Past Surgical History:  Procedure Laterality Date  . ANTERIOR CERVICAL CORPECTOMY N/A 05/24/2017   Procedure: CORPECTOMY CERVICAL FIVE- CERVICAL SIX;  Surgeon: Ashok Pall, MD;  Location: [place];  Service: Neurosurgery;  Laterality: N/A;  . ARTERIOVENOUS GRAFT PLACEMENT Right 1990's?  . ARTERIOVENOUS GRAFT PLACEMENT Left 07/2002   upper arm/notes 06/02/2010  . ARTERIOVENOUS GRAFT PLACEMENT Right 07/2003   upper arm/notes 06/02/2010  . AV FISTULA PLACEMENT Left 09/2000   upper arm/notes 06/02/2010  . BREAST BIOPSY Left unsure   benign  . COLONOSCOPY    . DILATATION & CURRETTAGE/HYSTEROSCOPY WITH RESECTOCOPE N/A 04/17/2013   Procedure: DILATATION & CURETTAGE/HYSTEROSCOPY WITH RESECTOCOPE;  Surgeon: Marvene Staff, MD;  Location: [facility];  Service: Gynecology;  Laterality: N/A;  . DILATION AND CURETTAGE OF UTERUS    . EYE SURGERY    . KIDNEY TRANSPLANT  1997  . KIDNEY TRANSPLANT  01/21/2017   Dr. Harrington Challenger  . PARATHYROIDECTOMY  03/2000 05/12/2014   w/neck exploration & autotransplantation/notes 06/02/2010; w/neck exploration  . PARATHYROIDECTOMY N/A 05/12/2014   Procedure: PARATHYROIDECTOMY AND NECK  EXPLORATION;  Surgeon: Armandina Gemma, MD;  Location: [place];  Service: General;  Laterality: N/A;  . PERITONEAL CATHETER INSERTION    . PERITONEAL CATHETER REMOVAL  08/1999   [name] 06/02/2010  . POSTERIOR CERVICAL FUSION/FORAMINOTOMY N/A 05/30/2017   Procedure: POSTERIOR CERVICAL Arthrodesis Cervical three - four, cervical four - five, cervical five - six, cervical six - seven;  Surgeon: Ashok Pall, MD;  Location: [place];  Service: Neurosurgery;  Laterality: N/A;  .  POSTERIOR CERVICAL LAMINECTOMY N/A 08/12/2016   Procedure: POSTERIOR CERVICAL LAMINECTOMY CERVICAL THREE CERVICAL FOUR, CERVICAL FOUR CERVICAL FIVE, CERVICAL FIVE- CERVICAL SIX, CERVICAL SIX- CERVICAL SEVEN;  Surgeon: Ashok Pall, MD;  Location: [place];  Service: Neurosurgery;  Laterality: N/A;  POSTERIOR  . RETINAL DETACHMENT SURGERY Left   . THROMBECTOMY Left 02/2002   fistula/notes 06/02/2010  . THROMBECTOMY / ARTERIOVENOUS GRAFT REVISION  11/2003; 01/2004; 08/07/2005; 08/10/2005; 10/2005   [name] 06/02/2010  . THROMBECTOMY AND REVISION OF ARTERIOVENTOUS (AV) GORETEX  GRAFT Right 01/2002; 11/2003; 01/2004; 08/07/2004; 08/10/2004; 11/13/2005   [name] 5/1/2012Marland Kitchen [name] 5/16/2012Marland Kitchen [name] 5/16/2012Marland Kitchen [name] 5/16/2012Marland Kitchen [name] 06/02/2010; [name] 06/02/2010  . THROMBECTOMY AND REVISION OF ARTERIOVENTOUS (AV) GORETEX  GRAFT Left 08/23/2002; 09/13/2002; 07/08/2003   [name] 06/02/2010; [name] 06/02/2010; [name] 06/02/2010     OB History   None      Home Medications    Prior to Admission medications   Medication Sig Start Date End Date Taking? Authorizing Provider  acetaminophen (TYLENOL) 500 MG tablet Take 1,000 mg by mouth every 6 (six) hours as needed for moderate pain or headache.    Yes [provider]  ALPRAZolam (XANAX) 0.25 MG tablet Take 1 tablet (0.25 mg total) by mouth daily as needed for anxiety. 07/14/17  Yes Hoyt Koch, MD  gabapentin (NEURONTIN) 300 MG capsule Take 600 mg by mouth 2 (two) times daily as needed (nerve pain).  04/19/17  Yes [provider]  mycophenolate (MYFORTIC) 180 MG EC tablet Take 720 mg by mouth 2 (two) times daily.    Yes [provider]  oxyCODONE (OXY IR/ROXICODONE) 5 MG immediate release tablet Take 1-2 tablets (5-10 mg total) by mouth every 4 (four) hours as needed for moderate pain. 06/01/17  Yes Ashok Pall, MD  predniSONE (DELTASONE) 2.5 MG tablet Take 7.5 mg by mouth every morning.    Yes [provider]  ranitidine (ZANTAC)  150 MG tablet Take 150 mg by mouth at bedtime.   Yes [provider]  sodium bicarbonate 650 MG tablet Take 650 mg by mouth daily.    Yes [provider]  sulfamethoxazole-trimethoprim (BACTRIM,SEPTRA) 400-80 MG tablet Take 1 tablet by mouth every Monday, Wednesday, and Friday.   Yes [provider]  tacrolimus (PROGRAF) 1 MG capsule Take 3 mg by mouth 2 (two) times daily. Take three capsules by mouth in the morning and three capsules by mouth in the evening.   Yes [provider]    Family History Family History  Adopted: Yes  Problem Relation Age of Onset  . Diabetes Maternal Aunt        x2  . Diabetes Maternal Uncle     Social History Social History   Tobacco Use  . Smoking status: Former Smoker    Packs/day: 0.12    Years: 30.00    Pack years: 3.60    Types: Cigarettes    Last attempt to quit: 01/21/2017    Years since quitting: 0.5  . Smokeless tobacco: Never Used  Substance Use Topics  .  Alcohol use: No  . Drug use: No     Allergies   Contrast media [iodinated diagnostic agents]; Nsaids; Penicillins; Ace inhibitors; and Codeine   Review of Systems Review of Systems  All other systems reviewed and are negative.    Physical Exam Updated Vital Signs BP (!) 163/59   Pulse (!) 108   Temp 98.5 F (36.9 C) (Oral)   Resp 19   SpO2 100%   Physical Exam  Constitutional: She is oriented to person, place, and time. She appears well-developed and well-nourished.  HENT:  Head: Normocephalic and atraumatic.  Cardiovascular: Regular rhythm.  Murmur heard. Tachycardic. Systolic ejection murmur  Pulmonary/Chest: Effort normal and breath sounds normal. No respiratory distress.  Abdominal: Soft. There is no rebound and no guarding.  Mild epigastric and left upper quadrant tenderness. Surgical scar in the right lower quadrant. No tenderness over the right lower quadrant.  Genitourinary:  Genitourinary Comments: Nontender rectal exam  with bright red blood.  Musculoskeletal: She exhibits no edema or tenderness.  Fistula in the right upper extremity.  Neurological: She is alert and oriented to person, place, and time.  Skin: Skin is warm and dry.  Psychiatric: She has a normal mood and affect. Her behavior is normal.  Nursing note and vitals reviewed.    ED Treatments / Results  Labs (all labs ordered are listed, but only abnormal results are displayed) Labs Reviewed  COMPREHENSIVE METABOLIC PANEL - Abnormal; Notable for the following components:      Result Value   Sodium 133 (*)    CO2 18 (*)    Glucose, Bld 126 (*)    BUN 22 (*)    Creatinine, Ser 1.27 (*)    GFR calc non Af Amer 46 (*)    GFR calc Af Amer 54 (*)    All other components within normal limits  CBC WITH DIFFERENTIAL/PLATELET - Abnormal; Notable for the following components:   RBC 3.04 (*)    Hemoglobin 9.0 (*)    HCT 30.7 (*)    MCV 101.0 (*)    MCHC 29.3 (*)    Neutro Abs 8.0 (*)    Lymphs Abs 0.4 (*)    Monocytes Absolute 1.2 (*)    Abs Immature Granulocytes 0.3 (*)    All other components within normal limits  CBC - Abnormal; Notable for the following components:   RBC 2.80 (*)    Hemoglobin 8.3 (*)    HCT 27.5 (*)    All other components within normal limits  I-STAT CHEM 8, ED - Abnormal; Notable for the following components:   Sodium 133 (*)    BUN 21 (*)    Creatinine, Ser 1.20 (*)    Glucose, Bld 125 (*)    TCO2 18 (*)    Hemoglobin 9.2 (*)    HCT 27.0 (*)    All other components within normal limits  HIV ANTIBODY (ROUTINE TESTING)  TACROLIMUS LEVEL  TYPE AND SCREEN  PREPARE RBC (CROSSMATCH)  PREPARE RBC (CROSSMATCH)    EKG EKG Interpretation  Date/Time:  Sunday August 20 2017 07:16:50 EDT Ventricular Rate:  115 PR Interval:    QRS Duration: 71 QT Interval:  311 QTC Calculation: 431 R Axis:   75 Text Interpretation:  Sinus tachycardia Confirmed by Quintella Reichert [phone]) on 08/20/2017 7:29:10  AM   Radiology No results found.  Procedures Procedures (including critical care time) CRITICAL CARE Performed by: Quintella Reichert   Total critical care time: 60 minutes  Critical care  time was exclusive of separately billable procedures and treating other patients.  Critical care was necessary to treat or prevent imminent or life-threatening deterioration.  Critical care was time spent personally by me on the following activities: development of treatment plan with patient and/or surrogate as well as nursing, discussions with consultants, evaluation of patient's response to treatment, examination of patient, obtaining history from patient or surrogate, ordering and performing treatments and interventions, ordering and review of laboratory studies, ordering and review of radiographic studies, pulse oximetry and re-evaluation of patient's condition.  Medications Ordered in ED Medications  ALPRAZolam (XANAX) tablet 0.25 mg (has no administration in time range)  mycophenolate (MYFORTIC) EC tablet 720 mg (720 mg Oral Given 08/20/17 1052)  oxyCODONE (Oxy IR/ROXICODONE) immediate release tablet 5-10 mg (has no administration in time range)  predniSONE (DELTASONE) tablet 7.5 mg (7.5 mg Oral Given 08/20/17 1053)  famotidine (PEPCID) tablet 20 mg (has no administration in time range)  sodium bicarbonate tablet 650 mg (650 mg Oral Given 08/20/17 1053)  sulfamethoxazole-trimethoprim (BACTRIM,SEPTRA) 400-80 MG per tablet 1 tablet (has no administration in time range)  tacrolimus (PROGRAF) capsule 3 mg (3 mg Oral Given 08/20/17 1053)  0.9 %  sodium chloride infusion ( Intravenous Transfusing/Transfer 08/20/17 1559)  traZODone (DESYREL) tablet 25 mg (has no administration in time range)  ondansetron (ZOFRAN) tablet 4 mg ( Oral See Alternative 08/20/17 1114)    Or  ondansetron (ZOFRAN) injection 4 mg (4 mg Intravenous Given 08/20/17 1114)  0.9 %  sodium chloride infusion (Manually program via Guardrails IV  Fluids) (has no administration in time range)  sodium chloride 0.9 % bolus 500 mL (0 mLs Intravenous Stopped 08/20/17 0819)  0.9 %  sodium chloride infusion (Manually program via Guardrails IV Fluids) ( Intravenous Stopped 08/20/17 1415)     Initial Impression / Assessment and Plan / ED Course  I have reviewed the triage vital signs and the nursing notes.  Pertinent labs & imaging results that were available during my care of the patient were reviewed by me and considered in my medical decision making (see chart for details).     Patient status post renal transplant in January here for evaluation of rectal bleeding. Possibly started on Thursday evening versus this morning. She does have bright red blood on rectal examination. CBC notes significant decrease in hemoglobin compared to one week ago (12.7).   D/w Dr. Trinna Post with transplant service at [facility]. Really there are no beds available at [facility]. He recommends ongoing treatments at our facility. He does recommend continuing her prednisone, myfortic 360 bid and Prograf. He does recommend during her hospitalization to check a Prograf trough with a goal of 6 to 7. Her aspirin may be discontinued. She has been undergoing treatment for CMV with valacyclovir and he recommends continuing this. Her last levels that were checked were negative.  D/w Janett Billow with [facility] - will see the patient in consult  Discuss with hospitalist service, plan to admit for further treatment.  Patient updated of findings of studies recommendation for admission and she is in agreement with treatment plan.  Final Clinical Impressions(s) / ED Diagnoses   Final diagnoses:  Acute blood loss anemia  Diverticulosis  Kidney transplant recipient    ED Discharge Orders    None       Quintella Reichert, MD 08/20/17 [phone]

## 2017-08-20 NOTE — ED Triage Notes (Signed)
Pt arrived via [facility]; pt from [facility] with c/o blood in stool; bright red without strain; stood up and felt lightheaded; pt recent eck sx 2 months ago and R kidney transplant patient in Jan 2019; dialysis fistula on R side; hx of pancreatitis; LLQ swollen; 90 palpated BP; 99% on RA; 120s; CBG 156

## 2017-08-20 NOTE — Progress Notes (Addendum)
Pt arrived to unit, 1 unit of blood transfusing. Stepdown monitor applied, CHG bath given. Pt oriented to unit and bed controls. Pt on clear liquid diet. Pt reports 10/10 pain in lower abdomen. Given prn oxy per [facility]  1805 - unit of blood finished. Pt having no s/s transfusion reaction. VS as below. No other needs voiced. BP 134/70 (BP Location: Right Leg)   Pulse 95   Temp 98.2 F (36.8 C) (Oral)   Resp 17   SpO2 100%

## 2017-08-20 NOTE — Consult Note (Signed)
Referring Provider:  Dr. Ralene Bathe, [place] Primary Care Physician:  Hoyt Koch, MD Primary Gastroenterologist:  Dr. Henrene Pastor  Reason for Consultation:  GI bleed  HPI: [name] is a 57 y.o. female with medical history significant for renal transplant January 2019 at [facility] secondary to glomerulonephritis, pancreatitis, hypothyroidism, hypertension, diverticulosis, anxiety, anemia who presents to emergency Department with chief complaint of rectal bleeding associated with dizziness and weakness.    She says that she believes she starting passing blood on Thursday.  She thought nothing of it initially because she had been eating a lot of beets and thought it was that.  This continue a couple of times Friday and Saturday and then this AM she had 2 episodes, last at bout 530 AM before coming to the ED.  Says that with the last episode she got very dizzy and weak, which is what prompted her to come to the ED.  She does report some abdominal bloating/discomfort on the left, mostly when she is about to have a BM/pass blood.  Reports nausea and vomiting at home, says that she vomited multiple times and there was no blood or coffee ground material.    ED Course: in the emergency department she's afebrile hemodynamically stable with mild tachycardia. No tachypnea no hypoxia. She is provided with 500 mL of normal saline and is typed and screened for packed RBCs, but not transfuse.  Hgb is 9.0 grams, which is down from 12.7 grams just last week at [facility].  Her transplant team was contacted and advised that it was ok to stop her ASA.  She is on no other antiplatelets/anticoagulants.  No NSAID's.  Last colonoscopy 04/2013 by Dr. Henrene Pastor showed severe diverticulosis and mild melanosis coli.  Repeat colonoscopy recommended in 10 years.   Past Medical History:  Diagnosis Date  . Acute blood loss anemia   . Anemia   . Anxiety   . Arthritis   . Claustrophobia   . Diverticulosis   . ESRD (end stage renal  disease) on dialysis ([place])    "TTS; [facility]" (03/24/2015)  . GI bleed   . Glomerulonephritis   . Heart murmur   . HTN (hypertension)   . Hypothyroidism   . Kidney transplant recipient 01/21/2017  . Pancreatitis   . Renal insufficiency   . Secondary hyperparathyroidism ([place])    [name] 05/11/2014    Past Surgical History:  Procedure Laterality Date  . ANTERIOR CERVICAL CORPECTOMY N/A 05/24/2017   Procedure: CORPECTOMY CERVICAL FIVE- CERVICAL SIX;  Surgeon: Ashok Pall, MD;  Location: [facility];  Service: Neurosurgery;  Laterality: N/A;  . ARTERIOVENOUS GRAFT PLACEMENT Right 1990's?  . ARTERIOVENOUS GRAFT PLACEMENT Left 07/2002   upper arm/notes 06/02/2010  . ARTERIOVENOUS GRAFT PLACEMENT Right 07/2003   upper arm/notes 06/02/2010  . AV FISTULA PLACEMENT Left 09/2000   upper arm/notes 06/02/2010  . BREAST BIOPSY Left unsure   benign  . COLONOSCOPY    . DILATATION & CURRETTAGE/HYSTEROSCOPY WITH RESECTOCOPE N/A 04/17/2013   Procedure: DILATATION & CURETTAGE/HYSTEROSCOPY WITH RESECTOCOPE;  Surgeon: Marvene Staff, MD;  Location: [facility];  Service: Gynecology;  Laterality: N/A;  . DILATION AND CURETTAGE OF UTERUS    . EYE SURGERY    . KIDNEY TRANSPLANT  1997  . KIDNEY TRANSPLANT  01/21/2017   Dr. Harrington Challenger  . PARATHYROIDECTOMY  03/2000 05/12/2014   w/neck exploration & autotransplantation/notes 06/02/2010; w/neck exploration  . PARATHYROIDECTOMY N/A 05/12/2014   Procedure: PARATHYROIDECTOMY AND NECK EXPLORATION;  Surgeon: Armandina Gemma, MD;  Location: [facility];  Service: General;  Laterality: N/A;  . PERITONEAL CATHETER INSERTION    . PERITONEAL CATHETER REMOVAL  08/1999   [name] 06/02/2010  . POSTERIOR CERVICAL FUSION/FORAMINOTOMY N/A 05/30/2017   Procedure: POSTERIOR CERVICAL Arthrodesis Cervical three - four, cervical four - five, cervical five - six, cervical six - seven;  Surgeon: Ashok Pall, MD;  Location: [facility];  Service: Neurosurgery;  Laterality: N/A;  . POSTERIOR CERVICAL  LAMINECTOMY N/A 08/12/2016   Procedure: POSTERIOR CERVICAL LAMINECTOMY CERVICAL THREE CERVICAL FOUR, CERVICAL FOUR CERVICAL FIVE, CERVICAL FIVE- CERVICAL SIX, CERVICAL SIX- CERVICAL SEVEN;  Surgeon: Ashok Pall, MD;  Location: [facility];  Service: Neurosurgery;  Laterality: N/A;  POSTERIOR  . RETINAL DETACHMENT SURGERY Left   . THROMBECTOMY Left 02/2002   fistula/notes 06/02/2010  . THROMBECTOMY / ARTERIOVENOUS GRAFT REVISION  11/2003; 01/2004; 08/07/2005; 08/10/2005; 10/2005   [name] 06/02/2010  . THROMBECTOMY AND REVISION OF ARTERIOVENTOUS (AV) GORETEX  GRAFT Right 01/2002; 11/2003; 01/2004; 08/07/2004; 08/10/2004; 11/13/2005   [name] 5/1/2012Marland Kitchen [name] 5/16/2012Marland Kitchen [name] 5/16/2012Marland Kitchen [name] 5/16/2012Marland Kitchen [name] 06/02/2010; [name] 06/02/2010  . THROMBECTOMY AND REVISION OF ARTERIOVENTOUS (AV) GORETEX  GRAFT Left 08/23/2002; 09/13/2002; 07/08/2003   [name] 06/02/2010; [name] 06/02/2010; [name] 06/02/2010    Prior to Admission medications   Medication Sig Start Date End Date Taking? Authorizing Provider  acetaminophen (TYLENOL) 500 MG tablet Take 1,000 mg by mouth every 6 (six) hours as needed for moderate pain or headache.    Yes [provider]  ALPRAZolam (XANAX) 0.25 MG tablet Take 1 tablet (0.25 mg total) by mouth daily as needed for anxiety. 07/14/17  Yes Hoyt Koch, MD  gabapentin (NEURONTIN) 300 MG capsule Take 600 mg by mouth 2 (two) times daily as needed (nerve pain).  04/19/17  Yes [provider]  mycophenolate (MYFORTIC) 180 MG EC tablet Take 720 mg by mouth 2 (two) times daily.    Yes [provider]  oxyCODONE (OXY IR/ROXICODONE) 5 MG immediate release tablet Take 1-2 tablets (5-10 mg total) by mouth every 4 (four) hours as needed for moderate pain. 06/01/17  Yes Ashok Pall, MD  predniSONE (DELTASONE) 2.5 MG tablet Take 7.5 mg by mouth every morning.    Yes [provider]  ranitidine (ZANTAC) 150 MG tablet Take 150 mg by mouth at bedtime.   Yes [provider]  sodium bicarbonate 650 MG tablet Take 650 mg by mouth daily.    Yes [provider]  sulfamethoxazole-trimethoprim (BACTRIM,SEPTRA) 400-80 MG tablet Take 1 tablet by mouth every Monday, Wednesday, and Friday.   Yes [provider]  tacrolimus (PROGRAF) 1 MG capsule Take 1 mg by mouth See admin instructions. Take three capsules by mouth in the morning and three capsules by mouth in the evening.   Yes [provider]    Current Facility-Administered Medications  Medication Dose Route Frequency Provider Last Rate Last Dose  . 0.9 %  sodium chloride infusion   Intravenous Continuous Black, Lezlie Octave, NP      . ALPRAZolam Duanne Moron) tablet 0.25 mg  0.25 mg Oral Daily PRN Radene Gunning, NP      . famotidine (PEPCID) tablet 20 mg  20 mg Oral QHS Black, Karen M, NP      . mycophenolate (MYFORTIC) EC tablet 720 mg  720 mg Oral BID Dyanne Carrel M, NP      . ondansetron [facility]) tablet 4 mg  4 mg Oral Q6H PRN Radene Gunning, NP       Or  .  ondansetron (ZOFRAN) injection 4 mg  4 mg Intravenous Q6H PRN Radene Gunning, NP      . oxyCODONE (Oxy IR/ROXICODONE) immediate release tablet 5-10 mg  5-10 mg Oral Q4H PRN Black, Karen M, NP      . predniSONE (DELTASONE) tablet 7.5 mg  7.5 mg Oral q morning - 10a Black, Lezlie Octave, NP      . sodium bicarbonate tablet 650 mg  650 mg Oral Daily Black, Lezlie Octave, NP      . Derrill Memo ON 08/21/2017] sulfamethoxazole-trimethoprim (BACTRIM,SEPTRA) 400-80 MG per tablet 1 tablet  1 tablet Oral Q M,W,F Black, Karen M, NP      . tacrolimus (PROGRAF) capsule 1 mg  1 mg Oral See admin instructions Black, Lezlie Octave, NP      . traZODone (DESYREL) tablet 25 mg  25 mg Oral QHS PRN Radene Gunning, NP       Current Outpatient Medications  Medication Sig Dispense Refill  . acetaminophen (TYLENOL) 500 MG tablet Take 1,000 mg by mouth every 6 (six) hours as needed for moderate pain or headache.     . ALPRAZolam (XANAX) 0.25 MG tablet Take 1 tablet (0.25 mg  total) by mouth daily as needed for anxiety. 20 tablet 0  . gabapentin (NEURONTIN) 300 MG capsule Take 600 mg by mouth 2 (two) times daily as needed (nerve pain).   5  . mycophenolate (MYFORTIC) 180 MG EC tablet Take 720 mg by mouth 2 (two) times daily.     Marland Kitchen oxyCODONE (OXY IR/ROXICODONE) 5 MG immediate release tablet Take 1-2 tablets (5-10 mg total) by mouth every 4 (four) hours as needed for moderate pain. 30 tablet 0  . predniSONE (DELTASONE) 2.5 MG tablet Take 7.5 mg by mouth every morning.     . ranitidine (ZANTAC) 150 MG tablet Take 150 mg by mouth at bedtime.    . sodium bicarbonate 650 MG tablet Take 650 mg by mouth daily.     Marland Kitchen sulfamethoxazole-trimethoprim (BACTRIM,SEPTRA) 400-80 MG tablet Take 1 tablet by mouth every Monday, Wednesday, and Friday.    . tacrolimus (PROGRAF) 1 MG capsule Take 1 mg by mouth See admin instructions. Take three capsules by mouth in the morning and three capsules by mouth in the evening.      Allergies as of 08/20/2017 - Review Complete 08/20/2017  Allergen Reaction Noted  . Contrast media [iodinated diagnostic agents] Other (See Comments) 05/22/2017  . Nsaids Other (See Comments) 05/18/2017  . Penicillins Hives and Other (See Comments) 03/19/2013  . Ace inhibitors Hives and Nausea And Vomiting 03/19/2013  . Codeine Hives 03/19/2013    Family History  Adopted: Yes  Problem Relation Age of Onset  . Diabetes Maternal Aunt        x2  . Diabetes Maternal Uncle     Social History   Socioeconomic History  . Marital status: Divorced    Spouse name: Not on file  . Number of children: 1  . Years of education: Not on file  . Highest education level: Not on file  Occupational History  . Not on file  Social Needs  . Financial resource strain: Not hard at all  . Food insecurity:    Worry: Never true    Inability: Never true  . Transportation needs:    Medical: No    Non-medical: No  Tobacco Use  . Smoking status: Former Smoker    Packs/day:  0.12    Years: 30.00    Pack years: 3.60  Types: Cigarettes    Last attempt to quit: 01/21/2017    Years since quitting: 0.5  . Smokeless tobacco: Never Used  Substance and Sexual Activity  . Alcohol use: No  . Drug use: No  . Sexual activity: Not Currently  Lifestyle  . Physical activity:    Days per week: 0 days    Minutes per session: 0 min  . Stress: Rather much  Relationships  . Social connections:    Talks on phone: More than three times a week    Gets together: More than three times a week    Attends religious service: More than 4 times per year    Active member of club or organization: Yes    Attends meetings of clubs or organizations: More than 4 times per year    Relationship status: Divorced  . Intimate partner violence:    Fear of current or ex partner: Not on file    Emotionally abused: Not on file    Physically abused: Not on file    Forced sexual activity: Not on file  Other Topics Concern  . Not on file  Social History Narrative  . Not on file    Review of Systems: ROS is O/W negative except as mentioned in HPI.  Physical Exam: Vital signs in last 24 hours: Temp:  [98.5 F (36.9 C)] 98.5 F (36.9 C) (08/04 0917) Pulse Rate:  [100-113] 100 (08/04 0917) Resp:  [15-21] 15 (08/04 0917) BP: (120-138)/(69-79) 138/74 (08/04 0917) SpO2:  [100 %] 100 % (08/04 0917)   General:  Alert, Well-developed, well-nourished, pleasant and cooperative in NAD Head:  Normocephalic and atraumatic. Eyes:  Sclera clear, no icterus.  Conjunctiva pink. Ears:  Normal auditory acuity. Mouth:  No deformity or lesions.   Lungs:  Clear throughout to auscultation.  No wheezes, crackles, or rhonchi.  Heart:  Slightly tachy but regular rhythm; no murmurs, clicks, rubs, or gallops. Abdomen:  Soft, non-distended.  BS present.  Mild left sided TTP.   Rectal:  Deferred.  EDP said red blood per rectum.  Msk:  Symmetrical without gross deformities. Pulses:  Normal pulses  noted. Extremities:  Without clubbing or edema. Neurologic:  Alert and oriented x 4;  grossly normal neurologically. Skin:  Intact without significant lesions or rashes. Psych:  Alert and cooperative. Normal mood and affect.  Intake/Output this shift: Total I/O In: 420.3 [IV Piggyback:420.3] Out: -   Lab Results: Recent Labs    08/20/17 0642 08/20/17 0722  WBC 10.0  --   HGB 9.0* 9.2*  HCT 30.7* 27.0*  PLT 243  --    BMET Recent Labs    08/20/17 0642 08/20/17 0722  NA 133* 133*  K 4.9 4.7  CL 107 108  CO2 18*  --   GLUCOSE 126* 125*  BUN 22* 21*  CREATININE 1.27* 1.20*  CALCIUM 9.7  --    LFT Recent Labs    08/20/17 0642  PROT 6.6  ALBUMIN 3.6  AST 21  ALT 9  ALKPHOS 80  BILITOT 0.6   IMPRESSION:  *GI bleed:  Sounds like lower in origin, likely diverticular bleed.   *ABLA:  Hgb down 3.5 grams from just last week. *Renal transplant in 01/2017 at [facility]: *Will observe for now.  Monitor Hgb and transfuse prn.   *If she continues to bleed in large amounts then will need nuc med bleeding scan as she is not a candidate for CTA. *Will allow clear liquids.   Laban Emperor.  Lakechia Nay  08/20/2017, 10:35 AM

## 2017-08-20 NOTE — H&P (Signed)
History and Physical    [name] JYN:[id number] DOB: 10/21/1960 DOA: 08/20/2017  PCP: Hoyt Koch, MD Patient coming from: home  Chief Complaint: melena  HPI: [name] is a very pleasant 57 y.o. 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 Initial evaluation reveals acute blood loss anemia. [facility] are asked to admit  Information is obtained from the patient. She states 4 days ago she developed maroon stools. She thought nothing of it initially been eating beets. Then yesterday she had a bowel movement that she described as bright red. Associated symptoms include dizziness shortness of breath. She denies chest pain palpitations syncope or near-syncope. She does report some abdominal bloating but denies any abdominal pain. No nausea vomiting. She states she is having more bowel movements than usual and the stools are much looser. She denies any clotting. She was taking aspirin but stopped that a couple days ago. She denies any NSAID use. She denies fever chills dysuria hematuria frequency or urgency. She also reports she had a colonoscopy 2015 that noted severe diverticulosis.   ED Course: in the emergency department she's afebrile hemodynamically stable with mild tachycardia. No tachypnea no hypoxia. She is provided with 500 mL of normal saline and is typed and screened for packed RBCs.  Review of Systems: As per HPI otherwise all other systems reviewed and are negative.   Ambulatory Status: she lives at home with her mother. She ambulates independently. She is independent with ADLs  Past Medical History:  Diagnosis Date  . Acute blood loss anemia   . Anemia   . Anxiety   . Arthritis   . Claustrophobia   . Diverticulosis   . ESRD  (end stage renal disease) on dialysis ([place])    "TTS; [facility]" (03/24/2015)  . GI bleed   . Glomerulonephritis   . Heart murmur   . HTN (hypertension)   . Hypothyroidism   . Kidney transplant recipient 01/21/2017  . Pancreatitis   . Renal insufficiency   . Secondary hyperparathyroidism ([place])    [name] 05/11/2014    Past Surgical History:  Procedure Laterality Date  . ANTERIOR CERVICAL CORPECTOMY N/A 05/24/2017   Procedure: CORPECTOMY CERVICAL FIVE- CERVICAL SIX;  Surgeon: Ashok Pall, MD;  Location: [facility];  Service: Neurosurgery;  Laterality: N/A;  . ARTERIOVENOUS GRAFT PLACEMENT Right 1990's?  . ARTERIOVENOUS GRAFT PLACEMENT Left 07/2002   upper arm/notes 06/02/2010  . ARTERIOVENOUS GRAFT PLACEMENT Right 07/2003   upper arm/notes 06/02/2010  . AV FISTULA PLACEMENT Left 09/2000   upper arm/notes 06/02/2010  . BREAST BIOPSY Left unsure   benign  . COLONOSCOPY    . DILATATION & CURRETTAGE/HYSTEROSCOPY WITH RESECTOCOPE N/A 04/17/2013   Procedure: DILATATION & CURETTAGE/HYSTEROSCOPY WITH RESECTOCOPE;  Surgeon: Marvene Staff, MD;  Location: [facility];  Service: Gynecology;  Laterality: N/A;  . DILATION AND CURETTAGE OF UTERUS    . EYE SURGERY    . KIDNEY TRANSPLANT  1997  . KIDNEY TRANSPLANT  01/21/2017   Dr. Harrington Challenger  . PARATHYROIDECTOMY  03/2000 05/12/2014   w/neck exploration & autotransplantation/notes 06/02/2010; w/neck exploration  . PARATHYROIDECTOMY N/A 05/12/2014   Procedure: PARATHYROIDECTOMY AND NECK EXPLORATION;  Surgeon: Armandina Gemma, MD;  Location: [facility];  Service: General;  Laterality: N/A;  . PERITONEAL CATHETER INSERTION    . PERITONEAL CATHETER REMOVAL  08/1999   [name]  06/02/2010  . POSTERIOR CERVICAL FUSION/FORAMINOTOMY N/A 05/30/2017   Procedure: POSTERIOR CERVICAL Arthrodesis Cervical three - four, cervical four - five, cervical five - six, cervical six - seven;  Surgeon: Ashok Pall, MD;  Location: [facility];  Service: Neurosurgery;  Laterality: N/A;  .  POSTERIOR CERVICAL LAMINECTOMY N/A 08/12/2016   Procedure: POSTERIOR CERVICAL LAMINECTOMY CERVICAL THREE CERVICAL FOUR, CERVICAL FOUR CERVICAL FIVE, CERVICAL FIVE- CERVICAL SIX, CERVICAL SIX- CERVICAL SEVEN;  Surgeon: Ashok Pall, MD;  Location: [facility];  Service: Neurosurgery;  Laterality: N/A;  POSTERIOR  . RETINAL DETACHMENT SURGERY Left   . THROMBECTOMY Left 02/2002   fistula/notes 06/02/2010  . THROMBECTOMY / ARTERIOVENOUS GRAFT REVISION  11/2003; 01/2004; 08/07/2005; 08/10/2005; 10/2005   [name] 06/02/2010  . THROMBECTOMY AND REVISION OF ARTERIOVENTOUS (AV) GORETEX  GRAFT Right 01/2002; 11/2003; 01/2004; 08/07/2004; 08/10/2004; 11/13/2005   [name] 5/1/2012Marland Kitchen [name] 5/16/2012Marland Kitchen [name] 5/16/2012Marland Kitchen [name] 5/16/2012Marland Kitchen [name] 06/02/2010; [name] 06/02/2010  . THROMBECTOMY AND REVISION OF ARTERIOVENTOUS (AV) GORETEX  GRAFT Left 08/23/2002; 09/13/2002; 07/08/2003   [name] 5/16/2012Marland Kitchen [name] 06/02/2010; [name] 06/02/2010    Social History   Socioeconomic History  . Marital status: Divorced    Spouse name: Not on file  . Number of children: 1  . Years of education: Not on file  . Highest education level: Not on file  Occupational History  . Not on file  Social Needs  . Financial resource strain: Not hard at all  . Food insecurity:    Worry: Never true    Inability: Never true  . Transportation needs:    Medical: No    Non-medical: No  Tobacco Use  . Smoking status: Former Smoker    Packs/day: 0.12    Years: 30.00    Pack years: 3.60    Types: Cigarettes    Last attempt to quit: 01/21/2017    Years since quitting: 0.5  . Smokeless tobacco: Never Used  Substance and Sexual Activity  . Alcohol use: No  . Drug use: No  . Sexual activity: Not Currently  Lifestyle  . Physical activity:    Days per week: 0 days    Minutes per session: 0 min  . Stress: Rather much  Relationships  . Social connections:    Talks on phone: More than three times a week    Gets together: More than three times a week     Attends religious service: More than 4 times per year    Active member of club or organization: Yes    Attends meetings of clubs or organizations: More than 4 times per year    Relationship status: Divorced  . Intimate partner violence:    Fear of current or ex partner: Not on file    Emotionally abused: Not on file    Physically abused: Not on file    Forced sexual activity: Not on file  Other Topics Concern  . Not on file  Social History Narrative  . Not on file    Allergies  Allergen Reactions  . Contrast Media [Iodinated Diagnostic Agents] Other (See Comments)    Cant take due to kidney transplant  . Nsaids Other (See Comments)    Cant take due to kidney transplant   . Penicillins Hives and Other (See Comments)    PATIENT HAS HAD A PCN REACTION WITH IMMEDIATE RASH, FACIAL/TONGUE/THROAT SWELLING, SOB, OR LIGHTHEADEDNESS WITH HYPOTENSION:  #  #  YES  #  #  Has patient had a PCN reaction causing severe rash involving mucus membranes or skin  necrosis: No Has patient had a PCN reaction that required hospitalization No Has patient had a PCN reaction occurring within the last 10 years: No If all of the above answers are "NO", then may proceed with Cephalosporin  . Ace Inhibitors Hives and Nausea And Vomiting  . Codeine Hives    Family History  Adopted: Yes  Problem Relation Age of Onset  . Diabetes Maternal Aunt        x2  . Diabetes Maternal Uncle     Prior to Admission medications   Medication Sig Start Date End Date Taking? Authorizing Provider  acetaminophen (TYLENOL) 500 MG tablet Take 1,000 mg by mouth every 6 (six) hours as needed for moderate pain or headache.    Yes [provider]  ALPRAZolam (XANAX) 0.25 MG tablet Take 1 tablet (0.25 mg total) by mouth daily as needed for anxiety. 07/14/17  Yes Hoyt Koch, MD  gabapentin (NEURONTIN) 300 MG capsule Take 600 mg by mouth 2 (two) times daily as needed (nerve pain).  04/19/17  Yes [provider]  mycophenolate (MYFORTIC) 180 MG EC tablet Take 720 mg by mouth 2 (two) times daily.    Yes [provider]  oxyCODONE (OXY IR/ROXICODONE) 5 MG immediate release tablet Take 1-2 tablets (5-10 mg total) by mouth every 4 (four) hours as needed for moderate pain. 06/01/17  Yes Ashok Pall, MD  predniSONE (DELTASONE) 2.5 MG tablet Take 7.5 mg by mouth every morning.    Yes [provider]  ranitidine (ZANTAC) 150 MG tablet Take 150 mg by mouth at bedtime.   Yes [provider]  sodium bicarbonate 650 MG tablet Take 650 mg by mouth daily.    Yes [provider]  sulfamethoxazole-trimethoprim (BACTRIM,SEPTRA) 400-80 MG tablet Take 1 tablet by mouth every Monday, Wednesday, and Friday.   Yes [provider]  tacrolimus (PROGRAF) 1 MG capsule Take 1 mg by mouth See admin instructions. Take three capsules by mouth in the morning and three capsules by mouth in the evening.   Yes [provider]    Physical Exam: Vitals:   08/20/17 0645 08/20/17 0700 08/20/17 0715 08/20/17 0917  BP: 123/69 120/76 129/75 138/74  Pulse: (!) 112 (!) 110 (!) 113 100  Resp: (!) 21 20 15 15   Temp:    98.5 F (36.9 C)  TempSrc:    Oral  SpO2: 100% 100% 100% 100%     General:  Appears calm and comfortable lying in bed in no acute distress Eyes:  PERRL, EOMI, normal lids, iris ENT:  grossly normal hearing, lips & tongue, mucous membranes of her mouth are moist and pink Neck:  no LAD, masses or thyromegaly Cardiovascular:  Tachycardic but regular, no m/r/g. No LE edema.  Respiratory:  CTA bilaterally, no w/r/r. Normal respiratory effort. Abdomen:  soft, ntnd, positive bowel sounds but somewhat sluggish mild diffuse tenderness particularly in the lower quadrants to palpation no guarding or rebounding Skin:  no rash or induration seen on limited exam Musculoskeletal:  grossly normal tone BUE/BLE, good ROM, no bony abnormality, right arm with dialysis graft. Healing  anterior cervical scar Psychiatric:  grossly normal mood and affect, speech fluent and appropriate, AOx3 Neurologic:  CN 2-12 grossly intact, moves all extremities in coordinated fashion, sensation intact, speech clear facial symmetry   Labs on Admission: I have personally reviewed following labs and imaging studies  CBC: Recent Labs  Lab 08/20/17 0642 08/20/17 0722  WBC 10.0  --   NEUTROABS 8.0*  --  HGB 9.0* 9.2*  HCT 30.7* 27.0*  MCV 101.0*  --   PLT 243  --    Basic Metabolic Panel: Recent Labs  Lab 08/20/17 0642 08/20/17 0722  NA 133* 133*  K 4.9 4.7  CL 107 108  CO2 18*  --   GLUCOSE 126* 125*  BUN 22* 21*  CREATININE 1.27* 1.20*  CALCIUM 9.7  --    GFR: CrCl cannot be calculated (Unknown ideal weight.). Liver Function Tests: Recent Labs  Lab 08/20/17 0642  AST 21  ALT 9  ALKPHOS 80  BILITOT 0.6  PROT 6.6  ALBUMIN 3.6   No results for input(s): LIPASE, AMYLASE in the last 168 hours. No results for input(s): AMMONIA in the last 168 hours. Coagulation Profile: No results for input(s): INR, PROTIME in the last 168 hours. Cardiac Enzymes: No results for input(s): CKTOTAL, CKMB, CKMBINDEX, TROPONINI in the last 168 hours. BNP (last 3 results) No results for input(s): PROBNP in the last 8760 hours. HbA1C: No results for input(s): HGBA1C in the last 72 hours. CBG: No results for input(s): GLUCAP in the last 168 hours. Lipid Profile: No results for input(s): CHOL, HDL, LDLCALC, TRIG, CHOLHDL, LDLDIRECT in the last 72 hours. Thyroid Function Tests: No results for input(s): TSH, T4TOTAL, FREET4, T3FREE, THYROIDAB in the last 72 hours. Anemia Panel: No results for input(s): VITAMINB12, FOLATE, FERRITIN, TIBC, IRON, RETICCTPCT in the last 72 hours. Urine analysis: No results found for: COLORURINE, APPEARANCEUR, LABSPEC, PHURINE, GLUCOSEU, HGBUR, BILIRUBINUR, KETONESUR, PROTEINUR, UROBILINOGEN, NITRITE, LEUKOCYTESUR  Creatinine Clearance: CrCl cannot be  calculated (Unknown ideal weight.).  Sepsis Labs: @LABRCNTIP (procalcitonin:4,lacticidven:4) )No results found for this or any previous visit (from the past 240 hour(s)).   Radiological Exams on Admission: No results found.  EKG: Independently reviewed. Sinus tachycardia  Assessment/Plan Principal Problem:   GI bleed Active Problems:   Acute blood loss anemia   Melena   HTN (hypertension), benign   Hypothyroidism   Diverticulosis   Kidney transplant recipient   Anxiety   Renal osteodystrophy   #1. GI bleed/melena.  Patient with a history of severe diverticular disease. Has had melena for 4 days. This morning developed brighter red stools. Associated symptoms include shortness of breath dizziness and diarrhea. Home medications to include aspirin she stopped that 3 days ago. Denies any NSAID use. No EtOH use. Home meds do include prednisone and zantac. No bowel movements during her time in the emergency department thus far.she's hemodynamically stable -Admit to step down -Monitor intake and output -Gentle IV fluids -Nothing by mouth except sips with meds -continue pepcid (our sub for zantac) -await gi recommendations  #2. Acute blood loss anemia. Hemoglobin 9.2 on admission. Chart review and care everywhere indicates hemoglobin greater than 12 last week. She is having dizziness and shortness of breath. -serial cbc -transfuse when indicated -monitor on tele  #3. Hypertension.controlled in the emergency department. No antihypertensive meds on home list -monitor  #4. 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 Creatinine 1.2. ED provider discussed with MD at [facility] who recommended  The following -continue prednisone -continue proraf and myfortic -obtain prograf trough -continue valacyclovir for cmv however patient states this med discontinued last week due to "low blood" on last weeks blood work. Will hold this med -call transplant coordinator tomorrow  with questions/concerns/issues [phone]  #5. Renal osteodystrophy s/p ACDF . Has been attending outpatient rehabilitation. Pain controlled -Continue home pain regimen  #6. Anxiety. Appears stable at baseline -Continue Xanax   DVT prophylaxis: scd  Code Status:  full  Family Communication: mother present  Disposition Plan: home   Consults called:  D/w dr Trinna [facility] per ED provider, Janett Billow with [facility] Admission status: inpatient   Radene Gunning NP [facility]  If 7PM-7AM, please contact night-coverage [email or web address] Password [email or web address]  08/20/2017, 10:21 AM

## 2017-08-21 ENCOUNTER — Encounter: Payer: Medicare Other | Admitting: Occupational Therapy

## 2017-08-21 ENCOUNTER — Encounter (HOSPITAL_COMMUNITY): Payer: Self-pay | Admitting: General Practice

## 2017-08-21 ENCOUNTER — Ambulatory Visit: Payer: Medicare Other | Admitting: Physical Therapy

## 2017-08-21 ENCOUNTER — Other Ambulatory Visit: Payer: Self-pay

## 2017-08-21 DIAGNOSIS — F419 Anxiety disorder, unspecified: Secondary | ICD-10-CM

## 2017-08-21 DIAGNOSIS — Z940 Kidney transplant status: Secondary | ICD-10-CM

## 2017-08-21 DIAGNOSIS — I10 Essential (primary) hypertension: Secondary | ICD-10-CM

## 2017-08-21 DIAGNOSIS — K5730 Diverticulosis of large intestine without perforation or abscess without bleeding: Secondary | ICD-10-CM

## 2017-08-21 DIAGNOSIS — N250 Renal osteodystrophy: Secondary | ICD-10-CM

## 2017-08-21 LAB — BASIC METABOLIC PANEL
Anion gap: 7 (ref 5–15)
BUN: 19 mg/dL (ref 6–20)
CHLORIDE: 107 mmol/L (ref 98–111)
CO2: 21 mmol/L — ABNORMAL LOW (ref 22–32)
Calcium: 9.4 mg/dL (ref 8.9–10.3)
Creatinine, Ser: 1.23 mg/dL — ABNORMAL HIGH (ref 0.44–1.00)
GFR calc Af Amer: 56 mL/min — ABNORMAL LOW (ref >60)
GFR calc non Af Amer: 48 mL/min — ABNORMAL LOW (ref >60)
GLUCOSE: 91 mg/dL (ref 70–99)
POTASSIUM: 5.3 mmol/L — AB (ref 3.5–5.1)
Sodium: 135 mmol/L (ref 135–145)

## 2017-08-21 LAB — CBC
HEMATOCRIT: 28.7 % — AB (ref 36.0–46.0)
Hemoglobin: 8.8 g/dL — ABNORMAL LOW (ref 12.0–15.0)
MCH: 28.5 pg (ref 26.0–34.0)
MCHC: 30.7 g/dL (ref 30.0–36.0)
MCV: 92.9 fL (ref 78.0–100.0)
Platelets: 216 10*3/uL (ref 150–400)
RBC: 3.09 MIL/uL — ABNORMAL LOW (ref 3.87–5.11)
RDW: 16.4 % — AB (ref 11.5–15.5)
WBC: 7.2 10*3/uL (ref 4.0–10.5)

## 2017-08-21 LAB — HIV ANTIBODY (ROUTINE TESTING W REFLEX): HIV SCREEN 4TH GENERATION: NONREACTIVE

## 2017-08-21 LAB — MRSA PCR SCREENING: MRSA by PCR: NEGATIVE

## 2017-08-21 MED ORDER — CIPROFLOXACIN IN D5W 400 MG/200ML IV SOLN
400.0000 mg | Freq: Two times a day (BID) | INTRAVENOUS | Status: DC
  Administered 2017-08-22: 400 mg via INTRAVENOUS
  Filled 2017-08-21: qty 200

## 2017-08-21 MED ORDER — METRONIDAZOLE IN NACL 5-0.79 MG/ML-% IV SOLN
500.0000 mg | Freq: Three times a day (TID) | INTRAVENOUS | Status: DC
  Administered 2017-08-21 – 2017-08-22 (×2): 500 mg via INTRAVENOUS
  Filled 2017-08-21 (×2): qty 100

## 2017-08-21 NOTE — Progress Notes (Signed)
Pharmacy Antibiotic Note  [name] is a 57 y.o. female admitted on 08/20/2017 with intra-abdominal infection.  Pharmacy has been consulted for ciprofloxacin dosing.  Colonoscopy showed severe diverticulosis. Afebrile, WBC wnl.   Plan: Start ciprofloxacin 400mg  IV Q12h Start Flagyl 500mg  IV Q8h per MD Monitor clinical picture, renal function F/U C&S, abx deescalation / LOT  Consider transition to PO soon   Height: 5\' 7"  (170.2 cm) Weight: 152 lb 1.9 oz (69 kg) IBW/kg (Calculated) : 61.6  Temp (24hrs), Avg:98.6 F (37 C), Min:98.1 F (36.7 C), Max:99.4 F (37.4 C)  Recent Labs  Lab 08/20/17 0642 08/20/17 0722 08/20/17 1127 08/21/17 0451  WBC 10.0  --  7.7 7.2  CREATININE 1.27* 1.20*  --  1.23*    Estimated Creatinine Clearance: 49.7 mL/min (A) (by C-G formula based on SCr of 1.23 mg/dL (H)).    Allergies  Allergen Reactions  . Contrast Media [Iodinated Diagnostic Agents] Other (See Comments)    Cant take due to kidney transplant  . Nsaids Other (See Comments)    Cant take due to kidney transplant   . Penicillins Hives and Other (See Comments)    PATIENT HAS HAD A PCN REACTION WITH IMMEDIATE RASH, FACIAL/TONGUE/THROAT SWELLING, SOB, OR LIGHTHEADEDNESS WITH HYPOTENSION:  #  #  YES  #  #  Has patient had a PCN reaction causing severe rash involving mucus membranes or skin necrosis: No Has patient had a PCN reaction that required hospitalization No Has patient had a PCN reaction occurring within the last 10 years: No If all of the above answers are "NO", then may proceed with Cephalosporin  . Ace Inhibitors Hives and Nausea And Vomiting  . Codeine Hives    Thank you for allowing pharmacy to be a part of this patient's care.  Reginia Naas 08/21/2017 2:09 PM

## 2017-08-21 NOTE — Progress Notes (Addendum)
[facility] Progress Note   Chief Complaint:   GI bleeding  SUBJECTIVE:    feels okay, no dizziness. No GI bleeding since Saturday  ASSESSMENT AND PLAN:   74. 57 yo female with known severe diverticulosis, s/p renal transplant Jan 2019 for glomerulonephritis. Admitted with hemodynamically stable GI bleeding. She had multiple episodes of maroon colored stools Thursday through Saturday ,  presumed to be a diverticular hemorrhage though it is unusual that she had associated mild abdominal discomfort and vomiting.  No bleeding since Saturday.  -no need for endoscopic workup at this point. Maybe home tomorrow?   2. ABL, hgb 8.3 yesterday, down from 12.7 late July. . Received a unit of blood yesterday hgb 8.8 today. She was dizzy on admission but this has resolved as far as she knows though really hasn't been up ambulating due due to all the IV's   Attending physician's note   I have taken an interval history, reviewed the chart and examined the patient. I agree with the Advanced Practitioner's note, impression and recommendations.   57 year old with likely diverticular bleed (severe diverticulosis noted on the last colonoscopy), appears to have resolved.  Plan to advance diet, recheck CBC in a.m..  If stable can discharge home with GI follow-up.  Carmell Austria, MD  OBJECTIVE:     Vital signs in last 24 hours: Temp:  [98.1 F (36.7 C)-99.2 F (37.3 C)] 98.1 F (36.7 C) (08/05 1145) Pulse Rate:  [95-108] 95 (08/04 1630) Resp:  [14-24] 24 (08/05 1230) BP: (105-163)/(30-103) 107/55 (08/05 1230) SpO2:  [100 %] 100 % (08/05 1230) Weight:  [152 lb 1.9 oz (69 kg)] 152 lb 1.9 oz (69 kg) (08/05 0441) Last BM Date: 08/20/17 General:   Alert, well-developed, female in NAD EENT:  Normal hearing, non icteric sclera, conjunctive pink.  Heart:  Regular rate and rhythm, no lower extremity edema Pulm: Normal respiratory effort, lungs CTA bilaterally without wheezes or  crackles. Abdomen:  Soft, nondistended, nontender.  Normal bowel sounds, no masses felt.     Neurologic:  Alert and  oriented x4;  grossly normal neurologically. Psych:  Pleasant, cooperative.  Normal mood and affect.   Intake/Output from previous day: 08/04 0701 - 08/05 0700 In: 2017 [I.V.:966.7; Blood:630; IV Piggyback:420.3] Out: -  Intake/Output this shift: Total I/O In: 360 [P.O.:360] Out: 300 [Urine:300]  Lab Results: Recent Labs    08/20/17 0642  08/20/17 1127 08/20/17 2113 08/21/17 0451  WBC 10.0  --  7.7  --  7.2  HGB 9.0*   < > 8.3* 9.0* 8.8*  HCT 30.7*   < > 27.5* 29.0* 28.7*  PLT 243  --  216  --  216   < > = values in this interval not displayed.   BMET Recent Labs    08/20/17 0642 08/20/17 0722 08/21/17 0451  NA 133* 133* 135  K 4.9 4.7 5.3*  CL 107 108 107  CO2 18*  --  21*  GLUCOSE 126* 125* 91  BUN 22* 21* 19  CREATININE 1.27* 1.20* 1.23*  CALCIUM 9.7  --  9.4   LFT Recent Labs    08/20/17 0642  PROT 6.6  ALBUMIN 3.6  AST 21  ALT 9  ALKPHOS 80  BILITOT 0.6     Principal Problem:   Lower GI bleed Active Problems:   HTN (hypertension), benign   Hypothyroidism   Anxiety   Renal osteodystrophy   Acute blood loss anemia   Diverticulosis   Melena  Kidney transplant recipient     LOS: 1 day   Tye Savoy ,NP 08/21/2017, 2:38 PM

## 2017-08-21 NOTE — Progress Notes (Signed)
PROGRESS NOTE    [name]  NAT:[id number] DOB: 11-09-1960 DOA: 08/20/2017 PCP: Hoyt Koch, MD  Outpatient Specialists:     Brief Narrative:  57 yo female with a PMH of diverticulosis (colonoscopy 2015), renal transplant 01/21/17 on chronic steroids, anxiety, renal osteodystrophy, presents with 4x days diarrhea with dark stools and associated SOB and lightheadedness. No history of GI bleed or chronic NSAID use. Pertinent labs upon admission include: CBC - RBC 3.09 Hgb 8.8 Hct 28.7 RDW 16.4. CMP - Na 133 CO2 18 Glu 126 BUN 22 Cr 1.27. EKG showed sinus tachycardia. The patient was admitted for further GI workup. GI is following.   Assessment & Plan:   Principal Problem:   Lower GI bleed Active Problems:   HTN (hypertension), benign   Hypothyroidism   Anxiety   Renal osteodystrophy   Acute blood loss anemia   Diverticulosis   Melena   Kidney transplant recipient  GI bleed with melena: -Colonoscopy from 2015 showed severe diverticulosis. -Reports melena 4x days with lightheadedness, SOB -Input: 1386. Output: 0. Net with blood transfusion: +2017. -Ins/Outs -IV fluids -GI following, recommendations from 01/20/17: observe for now. Watch hgb, transfuse prn, if bleeding continues nuc med bleeding scan (patient is not candidate for CTA), clear fluids -Start Ciprofloxacin + Flagyl for diverticulitis  -Continue Pepcid 20 mg QHS -Continue Zofran 4 mg Q6H prn  Acute Blood Loss Anemia: -Hx reports Hgb dropped from 12 to 9 since last week -CBC today: RBC 3.09 (yest 2.80), Hgb 8.8 (yest 8.3), Hct 28.7 (yest 27.5), RDW 16.4 -Transfused yesterday 2 units -Reports SOB and lightheadedness -Telemetry - last EKG sinus tachycardia -Transfuse prn  Hypertension: -Resolved -No home medications  Renal Transplant Recipient 01/21/17: -Continue prednisone, proraf, and myfortic -Prophylactic Bactrim q M/W/F   Renal Osteodystrophy s/p ACDF: -Patient endorses an improvement in her pain  since yesterday -Continue pain regimen, oxycodone 5-10 mg Q4H prn  Anxiety: -Xanax 0.25 mg daily prn   DVT prophylaxis: SCDs Code Status: Full Family Communication: Family was present at bedside during interview. The plan was discussed and all questions were answered. Disposition Plan: The patient's vitals are stable. However, her Hgb is still below normal range. Question if another transfusion is indicated or move forward with GI's recommendation for nuc med bleeding scan.    Consultants:   GI  Procedures:   None  Antimicrobials:   Bactrim   Subjective: The patient was lying supine in hospital bed alert and oriented in no acute distress. She denies N/V/D. The patient has not had a BM since admission and is currently on a clear liquid diet. This morning when she washed up, she noticed her lightheadedness improved but she still felt SOB. She is not SOB at rest.    Objective: Vitals:   08/21/17 0430 08/21/17 0441 08/21/17 0756 08/21/17 0831  BP: (!) 141/71   (!) 105/58  Pulse:      Resp: 16   (!) 21  Temp:  98.2 F (36.8 C) 98.3 F (36.8 C)   TempSrc:  Oral Oral   SpO2:    100%  Weight:  69 kg (152 lb 1.9 oz)    Height:  5\' 7"  (1.702 m)      Intake/Output Summary (Last 24 hours) at 08/21/2017 0928 Last data filed at 08/21/2017 0500 Gross per 24 hour  Intake 1596.67 ml  Output -  Net 1596.67 ml   Filed Weights   08/21/17 0441  Weight: 69 kg (152 lb 1.9 oz)  Examination:  General exam: Appears calm and comfortable  Respiratory system: Clear to auscultation. Respiratory effort normal. Cardiovascular system: S1 & S2 heard, RRR. No JVD, murmurs, rubs, gallops or clicks. No pedal edema. Gastrointestinal system: Abdomen is nondistended, soft. TTP to LLQ. No organomegaly or masses felt. Normal bowel sounds heard. Central nervous system: Alert and oriented. No focal neurological deficits. Extremities: Symmetric 5 x 5 power. Skin: No rashes, lesions or  ulcers Psychiatry: Judgement and insight appear normal. Mood & affect appropriate.     Data Reviewed: I have personally reviewed following labs and imaging studies  CBC: Recent Labs  Lab 08/20/17 0642 08/20/17 0722 08/20/17 1127 08/20/17 2113 08/21/17 0451  WBC 10.0  --  7.7  --  7.2  NEUTROABS 8.0*  --   --   --   --   HGB 9.0* 9.2* 8.3* 9.0* 8.8*  HCT 30.7* 27.0* 27.5* 29.0* 28.7*  MCV 101.0*  --  98.2  --  92.9  PLT 243  --  216  --  244   Basic Metabolic Panel: Recent Labs  Lab 08/20/17 0642 08/20/17 0722 08/21/17 0451  NA 133* 133* 135  K 4.9 4.7 5.3*  CL 107 108 107  CO2 18*  --  21*  GLUCOSE 126* 125* 91  BUN 22* 21* 19  CREATININE 1.27* 1.20* 1.23*  CALCIUM 9.7  --  9.4   GFR: Estimated Creatinine Clearance: 49.7 mL/min (A) (by C-G formula based on SCr of 1.23 mg/dL (H)). Liver Function Tests: Recent Labs  Lab 08/20/17 0642  AST 21  ALT 9  ALKPHOS 80  BILITOT 0.6  PROT 6.6  ALBUMIN 3.6   No results for input(s): LIPASE, AMYLASE in the last 168 hours. No results for input(s): AMMONIA in the last 168 hours. Coagulation Profile: No results for input(s): INR, PROTIME in the last 168 hours. Cardiac Enzymes: No results for input(s): CKTOTAL, CKMB, CKMBINDEX, TROPONINI in the last 168 hours. BNP (last 3 results) No results for input(s): PROBNP in the last 8760 hours. HbA1C: No results for input(s): HGBA1C in the last 72 hours. CBG: No results for input(s): GLUCAP in the last 168 hours. Lipid Profile: No results for input(s): CHOL, HDL, LDLCALC, TRIG, CHOLHDL, LDLDIRECT in the last 72 hours. Thyroid Function Tests: No results for input(s): TSH, T4TOTAL, FREET4, T3FREE, THYROIDAB in the last 72 hours. Anemia Panel: No results for input(s): VITAMINB12, FOLATE, FERRITIN, TIBC, IRON, RETICCTPCT in the last 72 hours. Urine analysis: No results found for: COLORURINE, APPEARANCEUR, LABSPEC, PHURINE, GLUCOSEU, HGBUR, BILIRUBINUR, KETONESUR, PROTEINUR,  UROBILINOGEN, NITRITE, LEUKOCYTESUR Sepsis Labs: @LABRCNTIP (procalcitonin:4,lacticidven:4)  ) Recent Results (from the past 240 hour(s))  MRSA PCR Screening     Status: None   Collection Time: 08/21/17  6:20 AM  Result Value Ref Range Status   MRSA by PCR NEGATIVE NEGATIVE Final    Comment:        The GeneXpert MRSA Assay (FDA approved for NASAL specimens only), is one component of a comprehensive MRSA colonization surveillance program. It is not intended to diagnose MRSA infection nor to guide or monitor treatment for MRSA infections. Performed at [facility], [place] [address] [place], [place] [address]          Radiology Studies: No results found.      Scheduled Meds: . sodium chloride   Intravenous Once  . famotidine  20 mg Oral QHS  . mycophenolate  720 mg Oral BID  . predniSONE  7.5 mg Oral q morning - 10a  .  sodium bicarbonate  650 mg Oral Daily  . sulfamethoxazole-trimethoprim  1 tablet Oral Q M,W,F  . tacrolimus  3 mg Oral BID   Continuous Infusions: . sodium chloride 50 mL/hr at 08/20/17 2000     LOS: 1 day    Marney Setting, PA-S Riccardo Dubin Arrien MD [facility] Pager [phone]  If 7PM-7AM, please contact night-coverage [email or web address] Password [email or web address] 08/21/2017, 9:28 AM

## 2017-08-21 NOTE — Progress Notes (Signed)
PROGRESS NOTE    [name]  OYD:[id number] DOB: [birth year] DOA: 08/20/2017 PCP: Hoyt Koch, MD    Brief Narrative:  57 year old female who presented with melena and abdominal pain.  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  Patient developed melena for the last 4 days, initially dark-colored stools thae bright red blood per rectum, associated with diarrhea and quadrant abdominal pain.  Physical examination blood pressure 123/69, heart rate 112, respiratory 21, temperature 98.5, oxygenation 100%.   Moist mucous membranes, lungs clear to auscultation bilaterally, heart S1-S2 present rhythmic, abdomen soft, active bowel sounds, tender in the lower quadrants, no rebound or guarding, no lower extremity edema.  Sodium 133, potassium 4.7, chloride 108, bicarb 21, glucose 125, BUN 21, creatinine 1.20, white count 7.7, hemoglobin 8.3, hematocrit 27.5, platelets 260.  EKG sinus tachycardia, normal axis, normal intervals, J-point elevation in V3 V4, positive LVH.  Patient was admitted to the hospital working diagnosis of acute blood loss anemia due to lower GI bleed.  Assessment & Plan:   Principal Problem:   Lower GI bleed Active Problems:   HTN (hypertension), benign   Hypothyroidism   Anxiety   Renal osteodystrophy   Acute blood loss anemia   Diverticulosis   Melena   Kidney transplant recipient   1. Acute blood loss anemia due to lower GI bleed, possible diverticulitis. Will continue supportive medical therapy, patient sp 2 untis prbc transfusion with hb at 8,8, will continue to follow cell count, transfuse if profuse bleeding or hb less than 7. Continue antiacid therapy and will add antibiotic therapy with ciprofloxacin and metronidazole for suspected diverticulitis. Advance diet as tolerated.   2.  Status post kidney transplant  with stage III chronic kidney disease.  Creatinine has remained stable, continue immunosuppressive therapy with prednisone, mycophenolate, Prograf and prophylactic Bactrim.  Continue oral bicarbonate. Will add gentle hydration with isotonic saline at 75 ml per hour.   3. HTN. Continue blood pressure monitoring, tolerating well IV fludis, off antihypertensive meds, blood pressure systolic 672 mmHg.   4. Anxiety. Continue as needed alprazolam.   DVT prophylaxis: scd   Code Status: full Family Communication: no family at the bedside  Disposition Plan/ discharge barriers: pending clinical improvement    Consultants:   GI   Procedures:     Antimicrobials:       Subjective: Patient has not had bowel movement, continue to have moderate left lower quadrant abdominal pain, no nausea or vomiting and tolerating clears.   Objective: Vitals:   08/21/17 0756 08/21/17 0831 08/21/17 1145 08/21/17 1230  BP:  (!) 105/58  (!) 107/55  Pulse:      Resp:  (!) 21  (!) 24  Temp: 98.3 F (36.8 C)  98.1 F (36.7 C)   TempSrc: Oral  Oral   SpO2:  100%  100%  Weight:      Height:        Intake/Output Summary (Last 24 hours) at 08/21/2017 1605 Last data filed at 08/21/2017 1000 Gross per 24 hour  Intake 1594.17 ml  Output 300 ml  Net 1294.17 ml   Filed Weights   08/21/17 0441  Weight: 69 kg (152 lb 1.9 oz)    Examination:   General: Not in pain or dyspnea, deconditioned  Neurology: Awake and alert, non focal  E ENT: mild pallor, no icterus, oral mucosa moist Cardiovascular: No JVD. S1-S2 present,  rhythmic, no gallops, rubs, or murmurs. No lower extremity edema. Pulmonary: positivebreath sounds bilaterally, adequate air movement, no wheezing, rhonchi or rales. Gastrointestinal. Abdomen mild distended, no organomegaly, no rebound or guarding. Positive tenderness at the left lower quadrant.  Skin. No rashes Musculoskeletal: no joint deformities     Data Reviewed: I have personally  reviewed following labs and imaging studies  CBC: Recent Labs  Lab 08/20/17 0642 08/20/17 0722 08/20/17 1127 08/20/17 2113 08/21/17 0451  WBC 10.0  --  7.7  --  7.2  NEUTROABS 8.0*  --   --   --   --   HGB 9.0* 9.2* 8.3* 9.0* 8.8*  HCT 30.7* 27.0* 27.5* 29.0* 28.7*  MCV 101.0*  --  98.2  --  92.9  PLT 243  --  216  --  175   Basic Metabolic Panel: Recent Labs  Lab 08/20/17 0642 08/20/17 0722 08/21/17 0451  NA 133* 133* 135  K 4.9 4.7 5.3*  CL 107 108 107  CO2 18*  --  21*  GLUCOSE 126* 125* 91  BUN 22* 21* 19  CREATININE 1.27* 1.20* 1.23*  CALCIUM 9.7  --  9.4   GFR: Estimated Creatinine Clearance: 49.7 mL/min (A) (by C-G formula based on SCr of 1.23 mg/dL (H)). Liver Function Tests: Recent Labs  Lab 08/20/17 0642  AST 21  ALT 9  ALKPHOS 80  BILITOT 0.6  PROT 6.6  ALBUMIN 3.6   No results for input(s): LIPASE, AMYLASE in the last 168 hours. No results for input(s): AMMONIA in the last 168 hours. Coagulation Profile: No results for input(s): INR, PROTIME in the last 168 hours. Cardiac Enzymes: No results for input(s): CKTOTAL, CKMB, CKMBINDEX, TROPONINI in the last 168 hours. BNP (last 3 results) No results for input(s): PROBNP in the last 8760 hours. HbA1C: No results for input(s): HGBA1C in the last 72 hours. CBG: No results for input(s): GLUCAP in the last 168 hours. Lipid Profile: No results for input(s): CHOL, HDL, LDLCALC, TRIG, CHOLHDL, LDLDIRECT in the last 72 hours. Thyroid Function Tests: No results for input(s): TSH, T4TOTAL, FREET4, T3FREE, THYROIDAB in the last 72 hours. Anemia Panel: No results for input(s): VITAMINB12, FOLATE, FERRITIN, TIBC, IRON, RETICCTPCT in the last 72 hours.    Radiology Studies: I have reviewed all of the imaging during this hospital visit personally     Scheduled Meds: . sodium chloride   Intravenous Once  . famotidine  20 mg Oral QHS  . mycophenolate  720 mg Oral BID  . predniSONE  7.5 mg Oral q  morning - 10a  . sodium bicarbonate  650 mg Oral Daily  . sulfamethoxazole-trimethoprim  1 tablet Oral Q M,W,F  . tacrolimus  3 mg Oral BID   Continuous Infusions: . sodium chloride 50 mL/hr at 08/20/17 2000  . ciprofloxacin    . metronidazole       LOS: 1 day        Tawni Millers, MD [facility] Pager [phone]

## 2017-08-22 DIAGNOSIS — E039 Hypothyroidism, unspecified: Secondary | ICD-10-CM

## 2017-08-22 LAB — CBC WITH DIFFERENTIAL/PLATELET
BASOS ABS: 0.1 10*3/uL (ref 0.0–0.1)
Basophils Relative: 1 %
EOS ABS: 0.1 10*3/uL (ref 0.0–0.7)
Eosinophils Relative: 1 %
HCT: 28.6 % — ABNORMAL LOW (ref 36.0–46.0)
Hemoglobin: 8.5 g/dL — ABNORMAL LOW (ref 12.0–15.0)
LYMPHS PCT: 7 %
Lymphs Abs: 0.4 10*3/uL — ABNORMAL LOW (ref 0.7–4.0)
MCH: 28.6 pg (ref 26.0–34.0)
MCHC: 29.7 g/dL — ABNORMAL LOW (ref 30.0–36.0)
MCV: 96.3 fL (ref 78.0–100.0)
MONO ABS: 0.8 10*3/uL (ref 0.1–1.0)
Monocytes Relative: 15 %
NEUTROS PCT: 76 %
Neutro Abs: 3.8 10*3/uL (ref 1.7–7.7)
PLATELETS: 224 10*3/uL (ref 150–400)
RBC: 2.97 MIL/uL — AB (ref 3.87–5.11)
RDW: 16.1 % — ABNORMAL HIGH (ref 11.5–15.5)
WBC: 5.2 10*3/uL (ref 4.0–10.5)

## 2017-08-22 LAB — TACROLIMUS LEVEL: Tacrolimus (FK506) - LabCorp: 6 ng/mL (ref 2.0–20.0)

## 2017-08-22 LAB — BASIC METABOLIC PANEL
ANION GAP: 11 (ref 5–15)
BUN: 17 mg/dL (ref 6–20)
CALCIUM: 9.3 mg/dL (ref 8.9–10.3)
CO2: 18 mmol/L — ABNORMAL LOW (ref 22–32)
CREATININE: 1.21 mg/dL — AB (ref 0.44–1.00)
Chloride: 106 mmol/L (ref 98–111)
GFR, EST AFRICAN AMERICAN: 57 mL/min — AB (ref >60)
GFR, EST NON AFRICAN AMERICAN: 49 mL/min — AB (ref >60)
Glucose, Bld: 86 mg/dL (ref 70–99)
Potassium: 5 mmol/L (ref 3.5–5.1)
Sodium: 135 mmol/L (ref 135–145)

## 2017-08-22 NOTE — Progress Notes (Signed)
PROGRESS NOTE    [name]  YFV:[id number] DOB: 11/03/1960 DOA: 08/20/2017 PCP: Hoyt Koch, MD    Brief Narrative:  57 year old female who presented with melena and abdominal pain.  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  Patient developed melena for the last 4 days, initially dark-colored stools thae bright red blood per rectum, associated with diarrhea and quadrant abdominal pain.  Physical examination blood pressure 123/69, heart rate 112, respiratory 21, temperature 98.5, oxygenation 100%.   Moist mucous membranes, lungs clear to auscultation bilaterally, heart S1-S2 present rhythmic, abdomen soft, active bowel sounds, tender in the lower quadrants, no rebound or guarding, no lower extremity edema.  Sodium 133, potassium 4.7, chloride 108, bicarb 21, glucose 125, BUN 21, creatinine 1.20, white count 7.7, hemoglobin 8.3, hematocrit 27.5, platelets 260.  EKG sinus tachycardia, normal axis, normal intervals, J-point elevation in V3 V4, positive LVH.  Patient was admitted to the hospital working diagnosis of acute blood loss anemia due to lower GI bleed.  Assessment & Plan:   Principal Problem:   Lower GI bleed Active Problems:   HTN (hypertension), benign   Hypothyroidism   Anxiety   Renal osteodystrophy   Acute blood loss anemia   Diverticulosis   Melena   Kidney transplant recipient  1. Acute blood loss anemia due to lower GI bleed, possible diverticulitis. No further bleeding hb and hct have been stable, (8,5/ 28,6) will hold on antibiotic therapy per GI recommendations, no further abdominal pain. Diet has been advanced to regular, and patient has vomited. Will observe overnight with plan to discharge in am, if tolerates po well.   2.  Status post kidney transplant with stage III chronic kidney disease.  Renal  function has remained stable with serum cr at 1,21, will continue immunosuppressive therapy with prednisone, mycophenolate, Prograf. On prophylactic Bactrim.  On oral bicarbonate. Discontinue IV fluids.   3. HTN. Not on antihypertensives, blood pressure systolic 163 to 846, will discontinue IV fluids for now.   4. Anxiety. As needed alprazolam with good toleration   DVT prophylaxis: scd   Code Status: full Family Communication: no family at the bedside  Disposition Plan/ discharge barriers: pending clinical improvement    Consultants:   GI   Procedures:     Antimicrobials:       Subjective: Patient had vomiting after eating regular diet, with nausea, no abdominal pain or diarrhea, no hematochezia.   Objective: Vitals:   08/22/17 0030 08/22/17 0430 08/22/17 0500 08/22/17 1441  BP: (!) 119/59 122/62  (!) 132/26  Pulse:      Resp: 16 11  19   Temp:  97.6 F (36.4 C) 98 F (36.7 C)   TempSrc:  Oral Oral   SpO2: 100% 100%    Weight:   68.9 kg (151 lb 14.4 oz)   Height:        Intake/Output Summary (Last 24 hours) at 08/22/2017 1538 Last data filed at 08/22/2017 0600 Gross per 24 hour  Intake 1562.5 ml  Output 600 ml  Net 962.5 ml   Filed Weights   08/21/17 0441 08/22/17 0500  Weight: 69 kg (152 lb 1.9 oz) 68.9 kg (151 lb 14.4 oz)    Examination:   General: Not in pain or dyspnea, deconditioned  Neurology: Awake and alert, non focal  E ENT: no pallor, no icterus, oral mucosa moist Cardiovascular: No JVD. S1-S2 present,  rhythmic, no gallops, rubs, or murmurs. No lower extremity edema. Pulmonary: positive breath sounds bilaterally, adequate air movement, no wheezing, rhonchi or rales. Gastrointestinal. Abdomen mild distended with no organomegaly, non tender, no rebound or guarding Skin. No rashes Musculoskeletal: no joint deformities     Data Reviewed: I have personally reviewed following labs and imaging studies  CBC: Recent Labs  Lab  08/20/17 0642 08/20/17 0722 08/20/17 1127 08/20/17 2113 08/21/17 0451 08/22/17 0547  WBC 10.0  --  7.7  --  7.2 5.2  NEUTROABS 8.0*  --   --   --   --  3.8  HGB 9.0* 9.2* 8.3* 9.0* 8.8* 8.5*  HCT 30.7* 27.0* 27.5* 29.0* 28.7* 28.6*  MCV 101.0*  --  98.2  --  92.9 96.3  PLT 243  --  216  --  216 629   Basic Metabolic Panel: Recent Labs  Lab 08/20/17 0642 08/20/17 0722 08/21/17 0451 08/22/17 0547  NA 133* 133* 135 135  K 4.9 4.7 5.3* 5.0  CL 107 108 107 106  CO2 18*  --  21* 18*  GLUCOSE 126* 125* 91 86  BUN 22* 21* 19 17  CREATININE 1.27* 1.20* 1.23* 1.21*  CALCIUM 9.7  --  9.4 9.3   GFR: Estimated Creatinine Clearance: 50.5 mL/min (A) (by C-G formula based on SCr of 1.21 mg/dL (H)). Liver Function Tests: Recent Labs  Lab 08/20/17 0642  AST 21  ALT 9  ALKPHOS 80  BILITOT 0.6  PROT 6.6  ALBUMIN 3.6   No results for input(s): LIPASE, AMYLASE in the last 168 hours. No results for input(s): AMMONIA in the last 168 hours. Coagulation Profile: No results for input(s): INR, PROTIME in the last 168 hours. Cardiac Enzymes: No results for input(s): CKTOTAL, CKMB, CKMBINDEX, TROPONINI in the last 168 hours. BNP (last 3 results) No results for input(s): PROBNP in the last 8760 hours. HbA1C: No results for input(s): HGBA1C in the last 72 hours. CBG: No results for input(s): GLUCAP in the last 168 hours. Lipid Profile: No results for input(s): CHOL, HDL, LDLCALC, TRIG, CHOLHDL, LDLDIRECT in the last 72 hours. Thyroid Function Tests: No results for input(s): TSH, T4TOTAL, FREET4, T3FREE, THYROIDAB in the last 72 hours. Anemia Panel: No results for input(s): VITAMINB12, FOLATE, FERRITIN, TIBC, IRON, RETICCTPCT in the last 72 hours.    Radiology Studies: I have reviewed all of the imaging during this hospital visit personally     Scheduled Meds: . sodium chloride   Intravenous Once  . famotidine  20 mg Oral QHS  . mycophenolate  720 mg Oral BID  . predniSONE   7.5 mg Oral q morning - 10a  . sodium bicarbonate  650 mg Oral Daily  . sulfamethoxazole-trimethoprim  1 tablet Oral Q M,W,F  . tacrolimus  3 mg Oral BID   Continuous Infusions: . sodium chloride 75 mL/hr at 08/22/17 0548     LOS: 2 days        Tawni Millers, MD [facility] Pager [phone]

## 2017-08-22 NOTE — Care Management Note (Signed)
Case Management Note  Patient Details  Name: [name] MRN: [id number] Date of Birth: [birth year]  Subjective/Objective:      Severe diverticulosis . Hx of January 2019 secondary to glomerulonephritis, pancreatitis, hypothyroidism, hypertension, diverticulosis, anxiety, anemia.       Doristine Bosworth ([address] Aura Dials (Daughter)    [phone]         PCP: Pricilla Holm   Action/Plan: Transition ton home when medically stable. No present needs identified per NCM.  Expected Discharge Date:    08/22/2017              Expected Discharge Plan:  Home/Self Care  In-House Referral:     Discharge planning Services  CM Consult  Post Acute Care Choice:    Choice offered to:     DME Arranged:   N/A DME Agency:   N/A  HH Arranged:   N/A HH Agency:   N/A  Status of Service:  Completed, signed off  If discussed at [place] of Stay Meetings, dates discussed:    Additional Comments:  Sharin Mons, RN 08/22/2017, 10:56 AM

## 2017-08-22 NOTE — Progress Notes (Addendum)
[facility] Progress Note   Chief Complaint:   GI bleeding   SUBJECTIVE:    feels fine. Still no bleeding since Saturday   ASSESSMENT AND PLAN:   72. 58 yo female with known severe diverticulosis admitted with hemodynamically stable GI bleeding described as multiple episodes of "beet colored", maroon stool Thursday through Saturday. No bleeding since. She had associated mild abdominal discomfort and vomiting.  -Presumably this was a diverticular hemorrhage though not totally textbook. No further bleeding. Advancing to regular diet and stopping cipro / flagyl  -Stable for discharge from GI standpoint -she will call our office asap for any recurrent GI bleeding  2. ABL. She had a 4 gram drop in hgb from 12.7 at [facility] on 7/29 to 8.3 this admission. She got one unit of blood on 8/4. Hgb low but overall stable at 8.5 today.  -recommend oral iron replacement upon discharge.  -I will place orders for her to have CBC at our office early next week. Lab is in basement.     Attending physician's note   I have taken an interval history, reviewed the chart and examined the patient. I agree with the Advanced Practitioner's note, impression and recommendations.   57 year old with likely diverticular bleed (severe diverticulosis noted on the last colonoscopy), appears to have resolved.  Advance diet.  No intervention planned.  Agree with iron supplementation on discharge.  Carmell Austria, MD   OBJECTIVE:     Vital signs in last 24 hours: Temp:  [97.6 F (36.4 C)-98.4 F (36.9 C)] 98 F (36.7 C) (08/06 0500) Pulse Rate:  [80] 80 (08/05 2100) Resp:  [11-24] 11 (08/06 0430) BP: (107-131)/(55-67) 122/62 (08/06 0430) SpO2:  [100 %] 100 % (08/06 0430) Weight:  [151 lb 14.4 oz (68.9 kg)] 151 lb 14.4 oz (68.9 kg) (08/06 0500) Last BM Date: 08/20/17 General:   Alert, well-developed, female in NAD EENT:  Normal hearing, non icteric sclera, conjunctive pink.  Heart:  Regular rate  and rhythm, no lower extremity edema Pulm: Normal respiratory effort, lungs CTA bilaterally without wheezes or crackles. Abdomen:  Soft, nondistended, nontender.  Normal bowel sounds, no masses felt.     Neurologic:  Alert and  oriented x4;  grossly normal neurologically. Psych:  Pleasant, cooperative.  Normal mood and affect.   Intake/Output from previous day: 08/05 0701 - 08/06 0700 In: 2402.5 [P.O.:840; I.V.:1462.5; IV Piggyback:100] Out: 900 [Urine:900] Intake/Output this shift: No intake/output data recorded.  Lab Results: Recent Labs    08/20/17 1127 08/20/17 2113 08/21/17 0451 08/22/17 0547  WBC 7.7  --  7.2 5.2  HGB 8.3* 9.0* 8.8* 8.5*  HCT 27.5* 29.0* 28.7* 28.6*  PLT 216  --  216 224   BMET Recent Labs    08/20/17 0642 08/20/17 0722 08/21/17 0451 08/22/17 0547  NA 133* 133* 135 135  K 4.9 4.7 5.3* 5.0  CL 107 108 107 106  CO2 18*  --  21* 18*  GLUCOSE 126* 125* 91 86  BUN 22* 21* 19 17  CREATININE 1.27* 1.20* 1.23* 1.21*  CALCIUM 9.7  --  9.4 9.3   LFT Recent Labs    08/20/17 0642  PROT 6.6  ALBUMIN 3.6  AST 21  ALT 9  ALKPHOS 80  BILITOT 0.6     Principal Problem:   Lower GI bleed Active Problems:   HTN (hypertension), benign   Hypothyroidism   Anxiety   Renal osteodystrophy   Acute blood loss anemia   Diverticulosis  Melena   Kidney transplant recipient     LOS: 2 days   Tye Savoy ,NP 08/22/2017, 10:07 AM

## 2017-08-23 LAB — CBC WITH DIFFERENTIAL/PLATELET
BAND NEUTROPHILS: 4 %
BLASTS: 0 %
Basophils Absolute: 0 10*3/uL (ref 0.0–0.1)
Basophils Relative: 0 %
EOS PCT: 4 %
Eosinophils Absolute: 0.2 10*3/uL (ref 0.0–0.7)
HEMATOCRIT: 24.3 % — AB (ref 36.0–46.0)
Hemoglobin: 7.7 g/dL — ABNORMAL LOW (ref 12.0–15.0)
LYMPHS ABS: 0.4 10*3/uL — AB (ref 0.7–4.0)
LYMPHS PCT: 7 %
MCH: 29.1 pg (ref 26.0–34.0)
MCHC: 31.7 g/dL (ref 30.0–36.0)
MCV: 91.7 fL (ref 78.0–100.0)
METAMYELOCYTES PCT: 0 %
MONOS PCT: 13 %
Monocytes Absolute: 0.7 10*3/uL (ref 0.1–1.0)
Myelocytes: 0 %
NEUTROS ABS: 4.3 10*3/uL (ref 1.7–7.7)
Neutrophils Relative %: 72 %
Other: 0 %
PLATELETS: 243 10*3/uL (ref 150–400)
Promyelocytes Relative: 0 %
RBC: 2.65 MIL/uL — AB (ref 3.87–5.11)
RDW: 15.9 % — AB (ref 11.5–15.5)
Smear Review: ADEQUATE
WBC: 5.6 10*3/uL (ref 4.0–10.5)
nRBC: 0 /100 WBC

## 2017-08-23 LAB — BASIC METABOLIC PANEL
ANION GAP: 9 (ref 5–15)
BUN: 14 mg/dL (ref 6–20)
CHLORIDE: 108 mmol/L (ref 98–111)
CO2: 18 mmol/L — ABNORMAL LOW (ref 22–32)
Calcium: 9.5 mg/dL (ref 8.9–10.3)
Creatinine, Ser: 1.24 mg/dL — ABNORMAL HIGH (ref 0.44–1.00)
GFR calc Af Amer: 55 mL/min — ABNORMAL LOW (ref >60)
GFR, EST NON AFRICAN AMERICAN: 48 mL/min — AB (ref >60)
GLUCOSE: 78 mg/dL (ref 70–99)
POTASSIUM: 4.7 mmol/L (ref 3.5–5.1)
Sodium: 135 mmol/L (ref 135–145)

## 2017-08-23 MED ORDER — PANTOPRAZOLE SODIUM 40 MG PO TBEC: 40.0000 mg | DELAYED_RELEASE_TABLET | Freq: Every day | ORAL | 0 refills | Status: DC

## 2017-08-23 NOTE — Progress Notes (Signed)
[name] discharged Home per MD order.  Discharge instructions reviewed and discussed with the patient, all questions and concerns answered. Copy of instructions, care notes for new medications, diagnosis and diets  and scripts given to patient.  Allergies as of 08/23/2017      Reactions   Contrast Media [iodinated Diagnostic Agents] Other (See Comments)   Cant take due to kidney transplant   Nsaids Other (See Comments)   Cant take due to kidney transplant    Penicillins Hives, Other (See Comments)   PATIENT HAS HAD A PCN REACTION WITH IMMEDIATE RASH, FACIAL/TONGUE/THROAT SWELLING, SOB, OR LIGHTHEADEDNESS WITH HYPOTENSION:  #  #  YES  #  #  Has patient had a PCN reaction causing severe rash involving mucus membranes or skin necrosis: No Has patient had a PCN reaction that required hospitalization No Has patient had a PCN reaction occurring within the last 10 years: No If all of the above answers are "NO", then may proceed with Cephalosporin   Ace Inhibitors Hives, Nausea And Vomiting   Codeine Hives      Medication List    STOP taking these medications   ranitidine 150 MG tablet Commonly known as:  ZANTAC     TAKE these medications   acetaminophen 500 MG tablet Commonly known as:  TYLENOL Take 1,000 mg by mouth every 6 (six) hours as needed for moderate pain or headache.   ALPRAZolam 0.25 MG tablet Commonly known as:  XANAX Take 1 tablet (0.25 mg total) by mouth daily as needed for anxiety.   gabapentin 300 MG capsule Commonly known as:  NEURONTIN Take 600 mg by mouth 2 (two) times daily as needed (nerve pain).   mycophenolate 180 MG EC tablet Commonly known as:  MYFORTIC Take 720 mg by mouth 2 (two) times daily.   oxyCODONE 5 MG immediate release tablet Commonly known as:  Oxy IR/ROXICODONE Take 1-2 tablets (5-10 mg total) by mouth every 4 (four) hours as needed for moderate pain. Notes to patient:  1 or 2 tabs every 4 hours as needed   pantoprazole 40 MG  tablet Commonly known as:  PROTONIX Take 1 tablet (40 mg total) by mouth daily.   predniSONE 2.5 MG tablet Commonly known as:  DELTASONE Take 7.5 mg by mouth every morning.   sodium bicarbonate 650 MG tablet Take 650 mg by mouth daily.   sulfamethoxazole-trimethoprim 400-80 MG tablet Commonly known as:  BACTRIM,SEPTRA Take 1 tablet by mouth every Monday, Wednesday, and Friday. Notes to patient:  Take Monday, Wednesday & Friday only   tacrolimus 1 MG capsule Commonly known as:  PROGRAF Take 3 mg by mouth 2 (two) times daily. Take three capsules by mouth in the morning and three capsules by mouth in the evening.        IV site discontinued and catheter remains intact. Site without signs and symptoms of complications. Dressing and pressure applied.  Pt. Acknowledged that she had all of her belongings, which included earrings, cell phone and clothing.   Patient escorted to car by volunteer service in a wheelchair,  no distress noted upon discharge.  [name] 08/23/2017 11:17 AM

## 2017-08-23 NOTE — Discharge Summary (Signed)
Physician Discharge Summary  [name] LTJ:[id number] DOB: Apr 20, 1960 DOA: 08/20/2017  PCP: Hoyt Koch, MD  Admit date: 08/20/2017 Discharge date: 08/23/2017  Admitted From: Home Disposition:  Home  Discharge Condition:Stable CODE STATUS:FULL Diet recommendation: Soft diet for next 3 to 5 days followed by high-fiber diet.  Brief/Interim Summary:  Patient is a53 year old female who presented with melena and abdominal pain.  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  Patient developed melena for the last 4 days, initially dark-colored stools thae bright red blood per rectum, associated with diarrhea and quadrant abdominal pain.  Physical examination blood pressure 123/69, heart rate 112, respiratory 21, temperature 98.5, oxygenation 100%.   Moist mucous membranes, lungs clear to auscultation bilaterally, heart S1-S2 present rhythmic, abdomen soft, active bowel sounds, tender in the lower quadrants, no rebound or guarding, no lower extremity edema.  Sodium 133, potassium 4.7, chloride 108, bicarb 21, glucose 125, BUN 21, creatinine 1.20, white count 7.7, hemoglobin 8.3, hematocrit 27.5, platelets 260.  EKG sinus tachycardia, normal axis, normal intervals, J-point elevation in V3 V4, positive LVH. Patient was admitted to the hospital working diagnosis of acute blood loss anemia due to lower GI bleed.  GI was consulted.  She had a history of severe diverticulosis and had presented with multiple episodes of maroon-colored stools.  She had no plan for any endoscopic work-up and wants to follow her as an outpatient.   This morning she was hemodynamically stable.She did not report any  bloody bowel movements.  Her hemoglobin is stable at 7.7.  She has been tolerating the diet.  She is stable for discharge home today.  She needs to follow-up with GI  as possible as an outpatient.  Following problems were addressed during hospitalization:  1. Acute blood loss anemia due to lower GI bleed, possible diverticulitis. No further bleeding .We will hold on antibiotic therapy per GI recommendations, no further abdominal pain. Diet has been advanced to regular. She has history of diverticulosis so diverticular bleed has been suspected.  She will follow-up with GI as an outpatient.  We recommend to do CBC test within 3 to 5 days.  2.Status post kidney transplant with stage III chronic kidney disease. Renal function has remained stable with serum cr at 1,21, will continue immunosuppressive therapy with prednisone,mycophenolate, Prograf. On prophylactic Bactrim.On oral bicarbonate. Discontinued IV fluids.   3. HTN. Not on antihypertensives.BP stable.  4. Anxiety. As needed alprazolam with good toleration       Discharge Diagnoses:  Principal Problem:   Lower GI bleed Active Problems:   HTN (hypertension), benign   Hypothyroidism   Anxiety   Renal osteodystrophy   Acute blood loss anemia   Diverticulosis   Melena   Kidney transplant recipient    Discharge Instructions  Discharge Instructions    Diet - low sodium heart healthy   Complete by:  As directed    Discharge instructions   Complete by:  As directed    1) Follow-up with gastroenterology within a week.  Do a CBC test during the follow-up. 2) Follow up with your PCP in a week. 3) Take soft diet for coming 3 to 5 days.  Continue high-fiber diet after that.   Increase activity slowly   Complete by:  As directed      Allergies as of 08/23/2017      Reactions   Contrast Media [iodinated Diagnostic  Agents] Other (See Comments)   Cant take due to kidney transplant   Nsaids Other (See Comments)   Cant take due to kidney transplant    Penicillins Hives, Other (See Comments)   PATIENT HAS HAD A PCN REACTION WITH IMMEDIATE RASH, FACIAL/TONGUE/THROAT SWELLING, SOB, OR  LIGHTHEADEDNESS WITH HYPOTENSION:  #  #  YES  #  #  Has patient had a PCN reaction causing severe rash involving mucus membranes or skin necrosis: No Has patient had a PCN reaction that required hospitalization No Has patient had a PCN reaction occurring within the last 10 years: No If all of the above answers are "NO", then may proceed with Cephalosporin   Ace Inhibitors Hives, Nausea And Vomiting   Codeine Hives      Medication List    STOP taking these medications   ranitidine 150 MG tablet Commonly known as:  ZANTAC     TAKE these medications   acetaminophen 500 MG tablet Commonly known as:  TYLENOL Take 1,000 mg by mouth every 6 (six) hours as needed for moderate pain or headache.   ALPRAZolam 0.25 MG tablet Commonly known as:  XANAX Take 1 tablet (0.25 mg total) by mouth daily as needed for anxiety.   gabapentin 300 MG capsule Commonly known as:  NEURONTIN Take 600 mg by mouth 2 (two) times daily as needed (nerve pain).   mycophenolate 180 MG EC tablet Commonly known as:  MYFORTIC Take 720 mg by mouth 2 (two) times daily.   oxyCODONE 5 MG immediate release tablet Commonly known as:  Oxy IR/ROXICODONE Take 1-2 tablets (5-10 mg total) by mouth every 4 (four) hours as needed for moderate pain.   pantoprazole 40 MG tablet Commonly known as:  PROTONIX Take 1 tablet (40 mg total) by mouth daily.   predniSONE 2.5 MG tablet Commonly known as:  DELTASONE Take 7.5 mg by mouth every morning.   sodium bicarbonate 650 MG tablet Take 650 mg by mouth daily.   sulfamethoxazole-trimethoprim 400-80 MG tablet Commonly known as:  BACTRIM,SEPTRA Take 1 tablet by mouth every Monday, Wednesday, and Friday.   tacrolimus 1 MG capsule Commonly known as:  PROGRAF Take 3 mg by mouth 2 (two) times daily. Take three capsules by mouth in the morning and three capsules by mouth in the evening.      Follow-up Information    Hoyt Koch, MD. Schedule an appointment as soon as  possible for a visit in 1 week(s).   Specialty:  Internal Medicine Contact information: [place] [address] [phone]        Jackquline Denmark, MD. Schedule an appointment as soon as possible for a visit in 5 day(s).   Specialties:  Gastroenterology, Internal Medicine Contact information: [place] [address] [phone]          Allergies  Allergen Reactions  . Contrast Media [Iodinated Diagnostic Agents] Other (See Comments)    Cant take due to kidney transplant  . Nsaids Other (See Comments)    Cant take due to kidney transplant   . Penicillins Hives and Other (See Comments)    PATIENT HAS HAD A PCN REACTION WITH IMMEDIATE RASH, FACIAL/TONGUE/THROAT SWELLING, SOB, OR LIGHTHEADEDNESS WITH HYPOTENSION:  #  #  YES  #  #  Has patient had a PCN reaction causing severe rash involving mucus membranes or skin necrosis: No Has patient had a PCN reaction that required hospitalization No Has patient had a PCN reaction  occurring within the last 10 years: No If all of the above answers are "NO", then may proceed with Cephalosporin  . Ace Inhibitors Hives and Nausea And Vomiting  . Codeine Hives    Consultations:  GI   Procedures/Studies:  No results found.    Subjective: Patient seen and examined the pressure this morning.  Remains comfortable.  No new bleeding episodes.  Tolerating diet.  Hemodynamically  stable.  Stable for discharge to home today.  Discharge Exam: Vitals:   08/23/17 0604 08/23/17 0633  BP: (!) 93/41 (!) 129/31  Pulse: 80 84  Resp: 17   Temp: 98.3 F (36.8 C)   SpO2: 100%    Vitals:   08/22/17 2142 08/23/17 0500 08/23/17 0604 08/23/17 0633  BP: (!) 130/31  (!) 93/41 (!) 129/31  Pulse: 82  80 84  Resp: 17  17   Temp: 98.1 F (36.7 C)  98.3 F (36.8 C)   TempSrc: Oral  Oral   SpO2: 100%  100%   Weight:  66.9 kg (147 lb 6.4 oz)    Height:        General: Pt is alert, awake, not  in acute distress Cardiovascular: RRR, S1/S2 +, no rubs, no gallops Respiratory: CTA bilaterally, no wheezing, no rhonchi Abdominal: Soft, NT, ND, bowel sounds + Extremities: no edema, no cyanosis    The results of significant diagnostics from this hospitalization (including imaging, microbiology, ancillary and laboratory) are listed below for reference.     Microbiology: Recent Results (from the past 240 hour(s))  MRSA PCR Screening     Status: None   Collection Time: 08/21/17  6:20 AM  Result Value Ref Range Status   MRSA by PCR NEGATIVE NEGATIVE Final    Comment:        The GeneXpert MRSA Assay (FDA approved for NASAL specimens only), is one component of a comprehensive MRSA colonization surveillance program. It is not intended to diagnose MRSA infection nor to guide or monitor treatment for MRSA infections. Performed at [facility], [place] [address] [place], [place] [address]      Labs: BNP (last 3 results) No results for input(s): BNP in the last 8760 hours. Basic Metabolic Panel: Recent Labs  Lab 08/20/17 0642 08/20/17 0722 08/21/17 0451 08/22/17 0547 08/23/17 0450  NA 133* 133* 135 135 135  K 4.9 4.7 5.3* 5.0 4.7  CL 107 108 107 106 108  CO2 18*  --  21* 18* 18*  GLUCOSE 126* 125* 91 86 78  BUN 22* 21* 19 17 14   CREATININE 1.27* 1.20* 1.23* 1.21* 1.24*  CALCIUM 9.7  --  9.4 9.3 9.5   Liver Function Tests: Recent Labs  Lab 08/20/17 0642  AST 21  ALT 9  ALKPHOS 80  BILITOT 0.6  PROT 6.6  ALBUMIN 3.6   No results for input(s): LIPASE, AMYLASE in the last 168 hours. No results for input(s): AMMONIA in the last 168 hours. CBC: Recent Labs  Lab 08/20/17 0642  08/20/17 1127 08/20/17 2113 08/21/17 0451 08/22/17 0547 08/23/17 0752  WBC 10.0  --  7.7  --  7.2 5.2 5.6  NEUTROABS 8.0*  --   --   --   --  3.8 4.3  HGB 9.0*   < > 8.3* 9.0* 8.8* 8.5* 7.7*  HCT 30.7*   < > 27.5* 29.0* 28.7* 28.6* 24.3*  MCV 101.0*  --  98.2  --  92.9 96.3 91.7   PLT 243  --  216  --  216 224 243   < > = values in this interval not displayed.   Cardiac Enzymes: No results for input(s): CKTOTAL, CKMB, CKMBINDEX, TROPONINI in the last 168 hours. BNP: Invalid input(s): POCBNP CBG: No results for input(s): GLUCAP in the last 168 hours. D-Dimer No results for input(s): DDIMER in the last 72 hours. Hgb A1c No results for input(s): HGBA1C in the last 72 hours. Lipid Profile No results for input(s): CHOL, HDL, LDLCALC, TRIG, CHOLHDL, LDLDIRECT in the last 72 hours. Thyroid function studies No results for input(s): TSH, T4TOTAL, T3FREE, THYROIDAB in the last 72 hours.  Invalid input(s): FREET3 Anemia work up No results for input(s): VITAMINB12, FOLATE, FERRITIN, TIBC, IRON, RETICCTPCT in the last 72 hours. Urinalysis No results found for: COLORURINE, APPEARANCEUR, [place], [place], [place], [place], [place], [place], PROTEINUR, UROBILINOGEN, NITRITE, LEUKOCYTESUR Sepsis Labs Invalid input(s): PROCALCITONIN,  WBC,  LACTICIDVEN Microbiology Recent Results (from the past 240 hour(s))  MRSA PCR Screening     Status: None   Collection Time: 08/21/17  6:20 AM  Result Value Ref Range Status   MRSA by PCR NEGATIVE NEGATIVE Final    Comment:        The GeneXpert MRSA Assay (FDA approved for NASAL specimens only), is one component of a comprehensive MRSA colonization surveillance program. It is not intended to diagnose MRSA infection nor to guide or monitor treatment for MRSA infections. Performed at [facility], [place] [address], [place], [place] [address]     Please note: You were cared for by a hospitalist during your hospital stay. Once you are discharged, your primary care physician will handle any further medical issues. Please note that NO REFILLS for any discharge medications will be authorized once you are discharged, as it is imperative that you return to your primary care physician (or establish a relationship with a primary  care physician if you do not have one) for your post hospital discharge needs so that they can reassess your need for medications and monitor your lab values.    Time coordinating discharge: 40 minutes  SIGNED:   Shelly Coss, MD  [facility] 08/23/2017, 10:19 AM Pager [phone]  If 7PM-7AM, please contact night-coverage [email or web address] Password [email or web address]

## 2017-08-24 ENCOUNTER — Encounter: Payer: Medicare Other | Admitting: Occupational Therapy

## 2017-08-24 ENCOUNTER — Ambulatory Visit: Payer: Medicare Other | Admitting: Physical Therapy

## 2017-08-24 LAB — BPAM RBC
BLOOD PRODUCT EXPIRATION DATE: 201908312359
Blood Product Expiration Date: 201908312359
ISSUE DATE / TIME: 201908041433
Unit Type and Rh: 5100
Unit Type and Rh: 5100

## 2017-08-24 LAB — TYPE AND SCREEN
ABO/RH(D): O POS
ANTIBODY SCREEN: NEGATIVE
UNIT DIVISION: 0
Unit division: 0

## 2017-08-25 ENCOUNTER — Telehealth: Payer: Self-pay | Admitting: Internal Medicine

## 2017-08-25 DIAGNOSIS — D8989 Other specified disorders involving the immune mechanism, not elsewhere classified: Secondary | ICD-10-CM | POA: Diagnosis not present

## 2017-08-25 DIAGNOSIS — Z79899 Other long term (current) drug therapy: Secondary | ICD-10-CM | POA: Diagnosis not present

## 2017-08-25 DIAGNOSIS — Z4822 Encounter for aftercare following kidney transplant: Secondary | ICD-10-CM | POA: Diagnosis not present

## 2017-08-25 DIAGNOSIS — K922 Gastrointestinal hemorrhage, unspecified: Secondary | ICD-10-CM | POA: Diagnosis not present

## 2017-08-25 DIAGNOSIS — J449 Chronic obstructive pulmonary disease, unspecified: Secondary | ICD-10-CM | POA: Diagnosis not present

## 2017-08-25 DIAGNOSIS — Z940 Kidney transplant status: Secondary | ICD-10-CM | POA: Diagnosis not present

## 2017-08-25 DIAGNOSIS — Z792 Long term (current) use of antibiotics: Secondary | ICD-10-CM | POA: Diagnosis not present

## 2017-08-25 DIAGNOSIS — M542 Cervicalgia: Secondary | ICD-10-CM | POA: Diagnosis not present

## 2017-08-25 DIAGNOSIS — Z7952 Long term (current) use of systemic steroids: Secondary | ICD-10-CM | POA: Diagnosis not present

## 2017-08-25 DIAGNOSIS — R600 Localized edema: Secondary | ICD-10-CM | POA: Diagnosis not present

## 2017-08-25 DIAGNOSIS — I10 Essential (primary) hypertension: Secondary | ICD-10-CM | POA: Diagnosis not present

## 2017-08-25 DIAGNOSIS — D649 Anemia, unspecified: Secondary | ICD-10-CM | POA: Diagnosis not present

## 2017-08-25 DIAGNOSIS — F17210 Nicotine dependence, cigarettes, uncomplicated: Secondary | ICD-10-CM | POA: Diagnosis not present

## 2017-08-25 DIAGNOSIS — E869 Volume depletion, unspecified: Secondary | ICD-10-CM | POA: Diagnosis not present

## 2017-08-25 NOTE — Telephone Encounter (Signed)
Did not want to see APP.  She is going to contact her transplant team for advice.

## 2017-08-25 NOTE — Telephone Encounter (Signed)
Patient states she is having a diverticulitis flare up and does not think she can wait till Oct to be seen by Dr.Perry. No current ov appt open with the APPs. Patient wanting advice. Patient last seen Dr.Perry 2015.

## 2017-08-28 ENCOUNTER — Ambulatory Visit: Payer: Medicare Other | Admitting: Rehabilitation

## 2017-08-28 ENCOUNTER — Other Ambulatory Visit: Payer: Self-pay

## 2017-08-28 ENCOUNTER — Encounter: Payer: Medicare Other | Admitting: Occupational Therapy

## 2017-08-28 DIAGNOSIS — K922 Gastrointestinal hemorrhage, unspecified: Secondary | ICD-10-CM

## 2017-08-31 ENCOUNTER — Ambulatory Visit: Payer: Medicare Other | Admitting: Physical Therapy

## 2017-08-31 ENCOUNTER — Encounter: Payer: Medicare Other | Admitting: Occupational Therapy

## 2017-09-01 DIAGNOSIS — Z79899 Other long term (current) drug therapy: Secondary | ICD-10-CM | POA: Diagnosis not present

## 2017-09-01 DIAGNOSIS — T8691 Unspecified transplanted organ and tissue rejection: Secondary | ICD-10-CM | POA: Diagnosis not present

## 2017-09-01 DIAGNOSIS — I10 Essential (primary) hypertension: Secondary | ICD-10-CM | POA: Diagnosis not present

## 2017-09-01 DIAGNOSIS — D649 Anemia, unspecified: Secondary | ICD-10-CM | POA: Diagnosis not present

## 2017-09-01 DIAGNOSIS — Z7952 Long term (current) use of systemic steroids: Secondary | ICD-10-CM | POA: Diagnosis not present

## 2017-09-01 DIAGNOSIS — Z4822 Encounter for aftercare following kidney transplant: Secondary | ICD-10-CM | POA: Diagnosis not present

## 2017-09-01 DIAGNOSIS — Z792 Long term (current) use of antibiotics: Secondary | ICD-10-CM | POA: Diagnosis not present

## 2017-09-01 DIAGNOSIS — E872 Acidosis: Secondary | ICD-10-CM | POA: Diagnosis not present

## 2017-09-01 DIAGNOSIS — E892 Postprocedural hypoparathyroidism: Secondary | ICD-10-CM | POA: Diagnosis not present

## 2017-09-01 DIAGNOSIS — Z940 Kidney transplant status: Secondary | ICD-10-CM | POA: Diagnosis not present

## 2017-09-01 DIAGNOSIS — Z87891 Personal history of nicotine dependence: Secondary | ICD-10-CM | POA: Diagnosis not present

## 2017-09-01 DIAGNOSIS — M542 Cervicalgia: Secondary | ICD-10-CM | POA: Diagnosis not present

## 2017-09-01 DIAGNOSIS — D899 Disorder involving the immune mechanism, unspecified: Secondary | ICD-10-CM | POA: Diagnosis not present

## 2017-09-01 DIAGNOSIS — Z5181 Encounter for therapeutic drug level monitoring: Secondary | ICD-10-CM | POA: Diagnosis not present

## 2017-09-01 DIAGNOSIS — I151 Hypertension secondary to other renal disorders: Secondary | ICD-10-CM | POA: Diagnosis not present

## 2017-09-01 DIAGNOSIS — K922 Gastrointestinal hemorrhage, unspecified: Secondary | ICD-10-CM | POA: Diagnosis not present

## 2017-09-01 DIAGNOSIS — J449 Chronic obstructive pulmonary disease, unspecified: Secondary | ICD-10-CM | POA: Diagnosis not present

## 2017-09-04 ENCOUNTER — Encounter: Payer: Self-pay | Admitting: Internal Medicine

## 2017-09-04 ENCOUNTER — Ambulatory Visit (INDEPENDENT_AMBULATORY_CARE_PROVIDER_SITE_OTHER): Payer: Medicare Other | Admitting: Internal Medicine

## 2017-09-04 VITALS — BP 102/58 | HR 87 | Temp 98.3°F | Ht 67.0 in | Wt 154.0 lb

## 2017-09-04 DIAGNOSIS — D62 Acute posthemorrhagic anemia: Secondary | ICD-10-CM

## 2017-09-04 DIAGNOSIS — M4012 Other secondary kyphosis, cervical region: Secondary | ICD-10-CM | POA: Diagnosis not present

## 2017-09-04 DIAGNOSIS — K922 Gastrointestinal hemorrhage, unspecified: Secondary | ICD-10-CM

## 2017-09-04 DIAGNOSIS — M542 Cervicalgia: Secondary | ICD-10-CM | POA: Diagnosis not present

## 2017-09-04 DIAGNOSIS — Z940 Kidney transplant status: Secondary | ICD-10-CM | POA: Diagnosis not present

## 2017-09-04 NOTE — Assessment & Plan Note (Signed)
Referral to PT for ongoing care after neck surgery in May.

## 2017-09-04 NOTE — Assessment & Plan Note (Signed)
No blood in stool. Recent labs with transplant center stable. Will follow up with GI on Monday. Advised to call if any blood or dark stools.

## 2017-09-04 NOTE — Assessment & Plan Note (Signed)
Stable labs at transplant visit within 1 week. No further blood in stool.

## 2017-09-04 NOTE — Progress Notes (Signed)
   Subjective:    Patient ID: [name], female    DOB: 01/30/1960, 57 y.o.   MRN: [id number]  HPI The patient is a 57 YO female coming in for hospital follow up (in for diverticular bleeding, observed with cessation of bleeding). She will see GI as an outpatient to follow up. She denies blood in stool since being home. Denies melena. Denies SOB or dizziness. Denies SOB or chest pains. Denies diarrhea or constipation. Is eating and drinking normally but still bland diet. Denies headaches or migraines.   PMH, [facility], social history reviewed and updated.   Review of Systems  Constitutional: Negative.   HENT: Negative.   Eyes: Negative.   Respiratory: Negative for cough, chest tightness and shortness of breath.   Cardiovascular: Negative for chest pain, palpitations and leg swelling.  Gastrointestinal: Negative for abdominal distention, abdominal pain, constipation, diarrhea, nausea and vomiting.  Musculoskeletal: Negative.   Skin: Negative.   Neurological: Negative.   Psychiatric/Behavioral: Negative.       Objective:   Physical Exam  Constitutional: She is oriented to person, place, and time. She appears well-developed and well-nourished.  HENT:  Head: Normocephalic and atraumatic.  Eyes: EOM are normal.  Neck: Normal range of motion.  Cardiovascular: Normal rate and regular rhythm.  Pulmonary/Chest: Effort normal and breath sounds normal. No respiratory distress. She has no wheezes. She has no rales.  Abdominal: Soft. Bowel sounds are normal. She exhibits no distension. There is no tenderness. There is no rebound.  Musculoskeletal: She exhibits no edema.  Neurological: She is alert and oriented to person, place, and time. Coordination normal.  Skin: Skin is warm and dry.  Psychiatric: She has a normal mood and affect.   Vitals:   09/04/17 1355  BP: (!) 102/58  Pulse: 87  Temp: 98.3 F (36.8 C)  TempSrc: Oral  SpO2: 99%  Weight: 154 lb (69.9 kg)  Height: 5\' 7"  (1.702 m)      Assessment & Plan:

## 2017-09-04 NOTE — Patient Instructions (Signed)
We do not need blood work today since the levels were stable.

## 2017-09-04 NOTE — Assessment & Plan Note (Signed)
Recent med adjustment of tacrolimus and she follows up with transplant clinic and all meds.

## 2017-09-11 ENCOUNTER — Encounter: Payer: Self-pay | Admitting: Gastroenterology

## 2017-09-11 ENCOUNTER — Ambulatory Visit (INDEPENDENT_AMBULATORY_CARE_PROVIDER_SITE_OTHER): Payer: Medicare Other | Admitting: Gastroenterology

## 2017-09-11 VITALS — BP 116/60 | HR 72 | Ht 67.0 in | Wt 155.6 lb

## 2017-09-11 DIAGNOSIS — D62 Acute posthemorrhagic anemia: Secondary | ICD-10-CM

## 2017-09-11 DIAGNOSIS — Z8719 Personal history of other diseases of the digestive system: Secondary | ICD-10-CM | POA: Diagnosis not present

## 2017-09-11 NOTE — Progress Notes (Signed)
Assessment and plans reviewed  

## 2017-09-11 NOTE — Progress Notes (Signed)
09/11/2017 [name] [id number] 1960-07-25   HISTORY OF PRESENT ILLNESS:  This is a 57 year old female who is a patient of Dr. Blanch Media.  She is here for hospital follow-up of recent diverticular bleed.  Hgb was down to 7.7 grams at discharge.  Received one unit PRBC's during hospital stay.  No further sign of bleeding since discharge.  Her Hgb was up to 8.9 grams on 8/16 at [facility] and she will have labs done again later this week.  Feels good, no complaints.  Last colonoscopy 04/2013 by Dr. Henrene Pastor showed severe diverticulosis and mild melanosis coli.  Repeat colonoscopy recommended in 10 years.  Past Medical History:  Diagnosis Date  . Acute blood loss anemia   . Anemia   . Anxiety   . Arthritis   . Claustrophobia   . Diverticulosis   . ESRD (end stage renal disease) on dialysis ([place])    "TTS; [place]" (03/24/2015)  . GI bleed   . Glomerulonephritis   . Heart murmur   . HTN (hypertension)   . Hypothyroidism   . Kidney transplant recipient 01/21/2017  . Pancreatitis   . Renal insufficiency   . Secondary hyperparathyroidism ([place])    [name] 05/11/2014   Past Surgical History:  Procedure Laterality Date  . ANTERIOR CERVICAL CORPECTOMY N/A 05/24/2017   Procedure: CORPECTOMY CERVICAL FIVE- CERVICAL SIX;  Surgeon: Ashok Pall, MD;  Location: [facility];  Service: Neurosurgery;  Laterality: N/A;  . ARTERIOVENOUS GRAFT PLACEMENT Right 1990's?  . ARTERIOVENOUS GRAFT PLACEMENT Left 07/2002   upper arm/notes 06/02/2010  . ARTERIOVENOUS GRAFT PLACEMENT Right 07/2003   upper arm/notes 06/02/2010  . AV FISTULA PLACEMENT Left 09/2000   upper arm/notes 06/02/2010  . BREAST BIOPSY Left unsure   benign  . COLONOSCOPY    . DILATATION & CURRETTAGE/HYSTEROSCOPY WITH RESECTOCOPE N/A 04/17/2013   Procedure: DILATATION & CURETTAGE/HYSTEROSCOPY WITH RESECTOCOPE;  Surgeon: Marvene Staff, MD;  Location: [facility];  Service: Gynecology;  Laterality: N/A;  . DILATION AND CURETTAGE OF  UTERUS    . EYE SURGERY    . KIDNEY TRANSPLANT  1997  . KIDNEY TRANSPLANT  01/21/2017   Dr. Harrington Challenger  . PARATHYROIDECTOMY  03/2000 05/12/2014   w/neck exploration & autotransplantation/notes 06/02/2010; w/neck exploration  . PARATHYROIDECTOMY N/A 05/12/2014   Procedure: PARATHYROIDECTOMY AND NECK EXPLORATION;  Surgeon: Armandina Gemma, MD;  Location: [facility];  Service: General;  Laterality: N/A;  . PERITONEAL CATHETER INSERTION    . PERITONEAL CATHETER REMOVAL  08/1999   [name] 06/02/2010  . POSTERIOR CERVICAL FUSION/FORAMINOTOMY N/A 05/30/2017   Procedure: POSTERIOR CERVICAL Arthrodesis Cervical three - four, cervical four - five, cervical five - six, cervical six - seven;  Surgeon: Ashok Pall, MD;  Location: [facility];  Service: Neurosurgery;  Laterality: N/A;  . POSTERIOR CERVICAL LAMINECTOMY N/A 08/12/2016   Procedure: POSTERIOR CERVICAL LAMINECTOMY CERVICAL THREE CERVICAL FOUR, CERVICAL FOUR CERVICAL FIVE, CERVICAL FIVE- CERVICAL SIX, CERVICAL SIX- CERVICAL SEVEN;  Surgeon: Ashok Pall, MD;  Location: [facility];  Service: Neurosurgery;  Laterality: N/A;  POSTERIOR  . RETINAL DETACHMENT SURGERY Left   . THROMBECTOMY Left 02/2002   fistula/notes 06/02/2010  . THROMBECTOMY / ARTERIOVENOUS GRAFT REVISION  11/2003; 01/2004; 08/07/2005; 08/10/2005; 10/2005   [name] 06/02/2010  . THROMBECTOMY AND REVISION OF ARTERIOVENTOUS (AV) GORETEX  GRAFT Right 01/2002; 11/2003; 01/2004; 08/07/2004; 08/10/2004; 11/13/2005   [name] 5/1/2012Marland Kitchen [name] 5/16/2012Marland Kitchen [name] 5/16/2012Marland Kitchen [name] 5/16/2012Marland Kitchen [name] 06/02/2010; [name] 06/02/2010  . THROMBECTOMY AND REVISION OF ARTERIOVENTOUS (AV) GORETEX  GRAFT Left 08/23/2002; 09/13/2002; 07/08/2003   [name] 06/02/2010; [name] 06/02/2010; [name] 06/02/2010    reports that she quit smoking about 7 months ago. Her smoking use included cigarettes. She has a 3.60 pack-year smoking history. She has never used smokeless tobacco. She reports that she does not drink alcohol or use drugs. family history includes  Diabetes in her maternal aunt and maternal uncle. She was adopted. Allergies  Allergen Reactions  . Contrast Media [Iodinated Diagnostic Agents] Other (See Comments)    Cant take due to kidney transplant  . Nsaids Other (See Comments)    Cant take due to kidney transplant   . Penicillins Hives and Other (See Comments)    PATIENT HAS HAD A PCN REACTION WITH IMMEDIATE RASH, FACIAL/TONGUE/THROAT SWELLING, SOB, OR LIGHTHEADEDNESS WITH HYPOTENSION:  #  #  YES  #  #  Has patient had a PCN reaction causing severe rash involving mucus membranes or skin necrosis: No Has patient had a PCN reaction that required hospitalization No Has patient had a PCN reaction occurring within the last 10 years: No If all of the above answers are "NO", then may proceed with Cephalosporin  . Ace Inhibitors Hives and Nausea And Vomiting  . Codeine Hives      Outpatient Encounter Medications as of 09/11/2017  Medication Sig  . acetaminophen (TYLENOL) 500 MG tablet Take 1,000 mg by mouth every 6 (six) hours as needed for moderate pain or headache.   . ALPRAZolam (XANAX) 0.25 MG tablet Take 1 tablet (0.25 mg total) by mouth daily as needed for anxiety.  . gabapentin (NEURONTIN) 300 MG capsule Take 600 mg by mouth 2 (two) times daily as needed (nerve pain).   . mycophenolate (MYFORTIC) 180 MG EC tablet Take 720 mg by mouth 2 (two) times daily.   Marland Kitchen oxyCODONE (OXY IR/ROXICODONE) 5 MG immediate release tablet Take 1-2 tablets (5-10 mg total) by mouth every 4 (four) hours as needed for moderate pain.  . predniSONE (DELTASONE) 2.5 MG tablet Take 7.5 mg by mouth every morning.   . sodium bicarbonate 650 MG tablet Take 650 mg by mouth daily.   Marland Kitchen sulfamethoxazole-trimethoprim (BACTRIM,SEPTRA) 400-80 MG tablet Take 1 tablet by mouth every Monday, Wednesday, and Friday.  . tacrolimus (PROGRAF) 1 MG capsule Take 3 mg by mouth 2 (two) times daily. Take three capsules by mouth in the morning and three capsules by mouth in the evening.   . pantoprazole (PROTONIX) 40 MG tablet Take 1 tablet (40 mg total) by mouth daily. (Patient not taking: Reported on 09/11/2017)   No facility-administered encounter medications on file as of 09/11/2017.      REVIEW OF SYSTEMS  : All other systems reviewed and negative except where noted in the History of Present Illness.   PHYSICAL EXAM: BP 116/60   Pulse 72   Ht 5\' 7"  (1.702 m)   Wt 155 lb 9.6 oz (70.6 kg)   BMI 24.37 kg/m  General: Well developed black female in no acute distress Head: Normocephalic and atraumatic Eyes:  Sclerae anicteric, conjunctiva pink. Ears: Normal auditory acuity Lungs: Clear throughout to auscultation; no increased WOB. Heart: Regular rate and rhythm; no M/R/G. Abdomen: Soft, non-distended.  BS present.  Non-tender. Musculoskeletal: Symmetrical with no gross deformities  Skin: No lesions on visible extremities Extremities: No edema  Neurological: Alert oriented x 4, grossly non-focal Psychological:  Alert and cooperative. Normal mood and affect  ASSESSMENT AND PLAN: *Diverticular bleed:  Hospitalized earlier this month.  No further sign of bleeding.  Hgb increasing.  Follow-up prn.   CC:  Hoyt Koch, *

## 2017-09-14 ENCOUNTER — Telehealth: Payer: Self-pay

## 2017-09-14 NOTE — Telephone Encounter (Signed)
We did PT referral after last visit.

## 2017-09-14 NOTE — Telephone Encounter (Signed)
Copied from [place] [phone]. Topic: General - Other >> Sep 14, 2017  9:24 AM Keene Breath wrote: Reason for CRM: Patient called to request that Dr. Sharlet Salina resend her referral because on the original referral she did not indicate where she was going to go.  Patient would like to go to [facility].  The referral has to have the name of facility.  Patient's CB# [phone]. >> Sep 14, 2017  9:46 AM Carlynn Purl wrote: There is no referral for neurology >> Sep 14, 2017 10:02 AM Para Skeans A wrote: Can you look into this? Should she have a referral?

## 2017-09-14 NOTE — Addendum Note (Signed)
Addended by: Raford Pitcher R on: 09/14/2017 01:23 PM   Modules accepted: Orders

## 2017-09-14 NOTE — Telephone Encounter (Signed)
Patient informed that [facility] neurology has been added to existing referral

## 2017-09-15 DIAGNOSIS — M542 Cervicalgia: Secondary | ICD-10-CM | POA: Diagnosis not present

## 2017-09-15 DIAGNOSIS — E872 Acidosis: Secondary | ICD-10-CM | POA: Diagnosis not present

## 2017-09-15 DIAGNOSIS — Z79899 Other long term (current) drug therapy: Secondary | ICD-10-CM | POA: Diagnosis not present

## 2017-09-15 DIAGNOSIS — D8989 Other specified disorders involving the immune mechanism, not elsewhere classified: Secondary | ICD-10-CM | POA: Diagnosis not present

## 2017-09-15 DIAGNOSIS — I10 Essential (primary) hypertension: Secondary | ICD-10-CM | POA: Diagnosis not present

## 2017-09-15 DIAGNOSIS — K922 Gastrointestinal hemorrhage, unspecified: Secondary | ICD-10-CM | POA: Diagnosis not present

## 2017-09-15 DIAGNOSIS — R000 Tachycardia, unspecified: Secondary | ICD-10-CM | POA: Diagnosis not present

## 2017-09-15 DIAGNOSIS — Z792 Long term (current) use of antibiotics: Secondary | ICD-10-CM | POA: Diagnosis not present

## 2017-09-15 DIAGNOSIS — Z87891 Personal history of nicotine dependence: Secondary | ICD-10-CM | POA: Diagnosis not present

## 2017-09-15 DIAGNOSIS — J3489 Other specified disorders of nose and nasal sinuses: Secondary | ICD-10-CM | POA: Diagnosis not present

## 2017-09-15 DIAGNOSIS — Z7952 Long term (current) use of systemic steroids: Secondary | ICD-10-CM | POA: Diagnosis not present

## 2017-09-15 DIAGNOSIS — J449 Chronic obstructive pulmonary disease, unspecified: Secondary | ICD-10-CM | POA: Diagnosis not present

## 2017-09-15 DIAGNOSIS — Z940 Kidney transplant status: Secondary | ICD-10-CM | POA: Diagnosis not present

## 2017-09-15 DIAGNOSIS — Z4822 Encounter for aftercare following kidney transplant: Secondary | ICD-10-CM | POA: Diagnosis not present

## 2017-09-28 DIAGNOSIS — M4802 Spinal stenosis, cervical region: Secondary | ICD-10-CM | POA: Diagnosis not present

## 2017-09-28 DIAGNOSIS — M4722 Other spondylosis with radiculopathy, cervical region: Secondary | ICD-10-CM | POA: Diagnosis not present

## 2017-09-29 ENCOUNTER — Encounter (HOSPITAL_COMMUNITY): Payer: Self-pay | Admitting: *Deleted

## 2017-09-29 ENCOUNTER — Inpatient Hospital Stay (HOSPITAL_COMMUNITY)
Admission: EM | Admit: 2017-09-29 | Discharge: 2017-10-01 | DRG: 378 | Disposition: A | Discharge status: Home / Self Care | Payer: Medicare Other | Attending: Internal Medicine | Admitting: Internal Medicine

## 2017-09-29 ENCOUNTER — Other Ambulatory Visit: Payer: Self-pay

## 2017-09-29 ENCOUNTER — Inpatient Hospital Stay (HOSPITAL_COMMUNITY): Payer: Medicare Other

## 2017-09-29 ENCOUNTER — Telehealth: Payer: Self-pay | Admitting: Internal Medicine

## 2017-09-29 DIAGNOSIS — K921 Melena: Secondary | ICD-10-CM

## 2017-09-29 DIAGNOSIS — Z833 Family history of diabetes mellitus: Secondary | ICD-10-CM

## 2017-09-29 DIAGNOSIS — X58XXXA Exposure to other specified factors, initial encounter: Secondary | ICD-10-CM | POA: Diagnosis present

## 2017-09-29 DIAGNOSIS — J069 Acute upper respiratory infection, unspecified: Secondary | ICD-10-CM | POA: Diagnosis present

## 2017-09-29 DIAGNOSIS — Z981 Arthrodesis status: Secondary | ICD-10-CM

## 2017-09-29 DIAGNOSIS — Z7982 Long term (current) use of aspirin: Secondary | ICD-10-CM

## 2017-09-29 DIAGNOSIS — E875 Hyperkalemia: Secondary | ICD-10-CM | POA: Diagnosis present

## 2017-09-29 DIAGNOSIS — Z880 Allergy status to penicillin: Secondary | ICD-10-CM

## 2017-09-29 DIAGNOSIS — Z885 Allergy status to narcotic agent status: Secondary | ICD-10-CM | POA: Diagnosis not present

## 2017-09-29 DIAGNOSIS — Z87891 Personal history of nicotine dependence: Secondary | ICD-10-CM

## 2017-09-29 DIAGNOSIS — K922 Gastrointestinal hemorrhage, unspecified: Secondary | ICD-10-CM | POA: Diagnosis present

## 2017-09-29 DIAGNOSIS — E032 Hypothyroidism due to medicaments and other exogenous substances: Secondary | ICD-10-CM | POA: Diagnosis not present

## 2017-09-29 DIAGNOSIS — R05 Cough: Secondary | ICD-10-CM | POA: Diagnosis not present

## 2017-09-29 DIAGNOSIS — K219 Gastro-esophageal reflux disease without esophagitis: Secondary | ICD-10-CM | POA: Diagnosis present

## 2017-09-29 DIAGNOSIS — Z79818 Long term (current) use of other agents affecting estrogen receptors and estrogen levels: Secondary | ICD-10-CM

## 2017-09-29 DIAGNOSIS — Z7952 Long term (current) use of systemic steroids: Secondary | ICD-10-CM

## 2017-09-29 DIAGNOSIS — I10 Essential (primary) hypertension: Secondary | ICD-10-CM | POA: Diagnosis present

## 2017-09-29 DIAGNOSIS — K5730 Diverticulosis of large intestine without perforation or abscess without bleeding: Secondary | ICD-10-CM | POA: Diagnosis not present

## 2017-09-29 DIAGNOSIS — B9789 Other viral agents as the cause of diseases classified elsewhere: Secondary | ICD-10-CM | POA: Diagnosis present

## 2017-09-29 DIAGNOSIS — Z79899 Other long term (current) drug therapy: Secondary | ICD-10-CM | POA: Diagnosis not present

## 2017-09-29 DIAGNOSIS — Z91041 Radiographic dye allergy status: Secondary | ICD-10-CM | POA: Diagnosis not present

## 2017-09-29 DIAGNOSIS — N183 Chronic kidney disease, stage 3 (moderate): Secondary | ICD-10-CM | POA: Diagnosis present

## 2017-09-29 DIAGNOSIS — K5791 Diverticulosis of intestine, part unspecified, without perforation or abscess with bleeding: Secondary | ICD-10-CM

## 2017-09-29 DIAGNOSIS — R059 Cough, unspecified: Secondary | ICD-10-CM

## 2017-09-29 DIAGNOSIS — E872 Acidosis: Secondary | ICD-10-CM | POA: Diagnosis present

## 2017-09-29 DIAGNOSIS — D62 Acute posthemorrhagic anemia: Secondary | ICD-10-CM | POA: Diagnosis present

## 2017-09-29 DIAGNOSIS — F419 Anxiety disorder, unspecified: Secondary | ICD-10-CM | POA: Diagnosis present

## 2017-09-29 DIAGNOSIS — Z4822 Encounter for aftercare following kidney transplant: Secondary | ICD-10-CM | POA: Diagnosis not present

## 2017-09-29 DIAGNOSIS — R1030 Lower abdominal pain, unspecified: Secondary | ICD-10-CM | POA: Diagnosis not present

## 2017-09-29 DIAGNOSIS — Z940 Kidney transplant status: Secondary | ICD-10-CM

## 2017-09-29 DIAGNOSIS — E039 Hypothyroidism, unspecified: Secondary | ICD-10-CM | POA: Diagnosis present

## 2017-09-29 DIAGNOSIS — F40240 Claustrophobia: Secondary | ICD-10-CM | POA: Diagnosis present

## 2017-09-29 DIAGNOSIS — Z79891 Long term (current) use of opiate analgesic: Secondary | ICD-10-CM | POA: Diagnosis not present

## 2017-09-29 DIAGNOSIS — Z792 Long term (current) use of antibiotics: Secondary | ICD-10-CM | POA: Diagnosis not present

## 2017-09-29 DIAGNOSIS — Z888 Allergy status to other drugs, medicaments and biological substances status: Secondary | ICD-10-CM

## 2017-09-29 DIAGNOSIS — M1990 Unspecified osteoarthritis, unspecified site: Secondary | ICD-10-CM | POA: Diagnosis present

## 2017-09-29 DIAGNOSIS — K5731 Diverticulosis of large intestine without perforation or abscess with bleeding: Principal | ICD-10-CM | POA: Diagnosis present

## 2017-09-29 DIAGNOSIS — R42 Dizziness and giddiness: Secondary | ICD-10-CM | POA: Diagnosis not present

## 2017-09-29 DIAGNOSIS — R011 Cardiac murmur, unspecified: Secondary | ICD-10-CM | POA: Diagnosis present

## 2017-09-29 DIAGNOSIS — T368X5A Adverse effect of other systemic antibiotics, initial encounter: Secondary | ICD-10-CM | POA: Diagnosis present

## 2017-09-29 DIAGNOSIS — R0981 Nasal congestion: Secondary | ICD-10-CM | POA: Diagnosis not present

## 2017-09-29 DIAGNOSIS — I700 Atherosclerosis of aorta: Secondary | ICD-10-CM | POA: Diagnosis present

## 2017-09-29 DIAGNOSIS — I129 Hypertensive chronic kidney disease with stage 1 through stage 4 chronic kidney disease, or unspecified chronic kidney disease: Secondary | ICD-10-CM | POA: Diagnosis present

## 2017-09-29 LAB — RESPIRATORY PANEL BY PCR
Adenovirus: NOT DETECTED
BORDETELLA PERTUSSIS-RVPCR: NOT DETECTED
CHLAMYDOPHILA PNEUMONIAE-RVPPCR: NOT DETECTED
Coronavirus 229E: NOT DETECTED
Coronavirus HKU1: NOT DETECTED
Coronavirus NL63: NOT DETECTED
Coronavirus OC43: NOT DETECTED
INFLUENZA A-RVPPCR: NOT DETECTED
Influenza B: NOT DETECTED
METAPNEUMOVIRUS-RVPPCR: NOT DETECTED
Mycoplasma pneumoniae: NOT DETECTED
PARAINFLUENZA VIRUS 2-RVPPCR: NOT DETECTED
PARAINFLUENZA VIRUS 3-RVPPCR: NOT DETECTED
PARAINFLUENZA VIRUS 4-RVPPCR: NOT DETECTED
Parainfluenza Virus 1: NOT DETECTED
RESPIRATORY SYNCYTIAL VIRUS-RVPPCR: NOT DETECTED
RHINOVIRUS / ENTEROVIRUS - RVPPCR: DETECTED — AB

## 2017-09-29 LAB — CBC
HCT: 36.8 % (ref 36.0–46.0)
HEMOGLOBIN: 11 g/dL — AB (ref 12.0–15.0)
MCH: 29.8 pg (ref 26.0–34.0)
MCHC: 29.9 g/dL — AB (ref 30.0–36.0)
MCV: 99.7 fL (ref 78.0–100.0)
Platelets: 248 10*3/uL (ref 150–400)
RBC: 3.69 MIL/uL — ABNORMAL LOW (ref 3.87–5.11)
RDW: 14.5 % (ref 11.5–15.5)
WBC: 8.6 10*3/uL (ref 4.0–10.5)

## 2017-09-29 LAB — COMPREHENSIVE METABOLIC PANEL
ALBUMIN: 4.1 g/dL (ref 3.5–5.0)
ALT: 12 U/L (ref 0–44)
AST: 23 U/L (ref 15–41)
Alkaline Phosphatase: 80 U/L (ref 38–126)
Anion gap: 11 (ref 5–15)
BILIRUBIN TOTAL: 0.8 mg/dL (ref 0.3–1.2)
BUN: 19 mg/dL (ref 6–20)
CO2: 17 mmol/L — AB (ref 22–32)
Calcium: 10.3 mg/dL (ref 8.9–10.3)
Chloride: 109 mmol/L (ref 98–111)
Creatinine, Ser: 1.22 mg/dL — ABNORMAL HIGH (ref 0.44–1.00)
GFR calc Af Amer: 56 mL/min — ABNORMAL LOW (ref >60)
GFR calc non Af Amer: 49 mL/min — ABNORMAL LOW (ref >60)
GLUCOSE: 104 mg/dL — AB (ref 70–99)
POTASSIUM: 4.9 mmol/L (ref 3.5–5.1)
SODIUM: 137 mmol/L (ref 135–145)
TOTAL PROTEIN: 7.5 g/dL (ref 6.5–8.1)

## 2017-09-29 LAB — HEMOGLOBIN AND HEMATOCRIT, BLOOD
HCT: 37.4 % (ref 36.0–46.0)
HEMOGLOBIN: 11.4 g/dL — AB (ref 12.0–15.0)

## 2017-09-29 LAB — TYPE AND SCREEN
ABO/RH(D): O POS
ANTIBODY SCREEN: NEGATIVE

## 2017-09-29 LAB — POC OCCULT BLOOD, ED: Fecal Occult Bld: POSITIVE — AB

## 2017-09-29 MED ORDER — GABAPENTIN 300 MG PO CAPS
600.0000 mg | ORAL_CAPSULE | Freq: Two times a day (BID) | ORAL | Status: DC | PRN
  Administered 2017-09-29 – 2017-09-30 (×2): 600 mg via ORAL
  Filled 2017-09-29 (×2): qty 2

## 2017-09-29 MED ORDER — FAMOTIDINE IN NACL 20-0.9 MG/50ML-% IV SOLN
20.0000 mg | Freq: Two times a day (BID) | INTRAVENOUS | Status: DC
  Administered 2017-09-29 – 2017-09-30 (×2): 20 mg via INTRAVENOUS
  Filled 2017-09-29 (×2): qty 50

## 2017-09-29 MED ORDER — TACROLIMUS 1 MG PO CAPS
4.0000 mg | ORAL_CAPSULE | Freq: Every morning | ORAL | Status: DC
  Administered 2017-09-30 – 2017-10-01 (×2): 4 mg via ORAL
  Filled 2017-09-29 (×2): qty 4

## 2017-09-29 MED ORDER — ONDANSETRON HCL 4 MG/2ML IJ SOLN
4.0000 mg | Freq: Once | INTRAMUSCULAR | Status: AC
  Administered 2017-09-29: 4 mg via INTRAVENOUS
  Filled 2017-09-29: qty 2

## 2017-09-29 MED ORDER — MYCOPHENOLATE SODIUM 180 MG PO TBEC
720.0000 mg | DELAYED_RELEASE_TABLET | Freq: Two times a day (BID) | ORAL | Status: DC
  Administered 2017-09-29 – 2017-10-01 (×4): 720 mg via ORAL
  Filled 2017-09-29 (×5): qty 4

## 2017-09-29 MED ORDER — ACETAMINOPHEN 325 MG PO TABS
650.0000 mg | ORAL_TABLET | Freq: Once | ORAL | Status: AC
  Administered 2017-09-29: 650 mg via ORAL

## 2017-09-29 MED ORDER — SODIUM CHLORIDE 0.9 % IV BOLUS
1000.0000 mL | Freq: Once | INTRAVENOUS | Status: AC
  Administered 2017-09-29: 1000 mL via INTRAVENOUS

## 2017-09-29 MED ORDER — TACROLIMUS 1 MG PO CAPS
3.0000 mg | ORAL_CAPSULE | Freq: Every day | ORAL | Status: DC
  Administered 2017-09-29 – 2017-09-30 (×2): 3 mg via ORAL
  Filled 2017-09-29 (×2): qty 3

## 2017-09-29 MED ORDER — ALPRAZOLAM 0.25 MG PO TABS: 0.2500 mg | ORAL_TABLET | Freq: Every day | ORAL | Status: DC | PRN

## 2017-09-29 MED ORDER — ACETAMINOPHEN 325 MG PO TABS
650.0000 mg | ORAL_TABLET | Freq: Four times a day (QID) | ORAL | Status: DC | PRN
  Administered 2017-09-29 – 2017-10-01 (×4): 650 mg via ORAL
  Filled 2017-09-29 (×4): qty 2

## 2017-09-29 NOTE — ED Notes (Signed)
While obtaining orthostatic vs on pt, while standing pt stated she felt very dizzy as if the room was spinning. This tech was unable to obtain orthostatic vs standing for 3 mins.

## 2017-09-29 NOTE — Consult Note (Addendum)
[facility] Consult: 12:28 PM 09/29/2017  LOS: 0 days    Referring Provider: Dr Otilio Jefferson in ED  Primary Care Physician:  Hoyt Koch, MD Primary Gastroenterologist:  Dr. Henrene Pastor    Reason for Consultation:  Hematochezia.     HPI: [name] is a 57 y.o. female.  PMH ESRD.  S/p kidney transplant in 1997 and graft explant with new transplant 01/2017.  On Prograf, Prednisone.  Anemia.  HTN. C spine surgery 05/2017.      06/2002 Colonoscopy.  Diverticulosis.  Normal ileocecal valve.  Random biopsies of grossly normal mucosa 12/2003 EGD to eval N/V/D, abd pain.  Normal study.   03/2004 capsule endoscopy to eval abd pain.  Complete study, capsule reached the cecum.  One small and one larger duodenal polyp.  Several areas of patchy, somewhat punctate appearing, erythematous mucosa without edema, r/o NSAID induced enteropathy, eosinophilic enteritis.   04/2013 Colonoscopy, avg risk screening study.  Revealed severe, pan diverticulosis.  Melanosis coli.  No polyps.  Due repeat study 05/2023.  03/2015 CT abdomen pelvis.  Pertinent GI findings included thickening of terminal ileum and cecal wall, suspicious for small bowel enteritis, IBD, infectious enteritis, less likely suspicious for PSBO.  Mild inflammation noted in terminal ileum and cecum.  Extensive atherosclerotic calcifications of abdominal aorta and iliac arteries.  Coarse peritoneal calcifications possibly related to peritoneal dialysis or sequela of prior peritoneal hemorrhage. 07/08/2017 CT abdomen/pelvis without contrast (at [facility]).   1.  Multiple fluid-filled dilated small bowel loops, predominantly in the right lower quadrant and right anatomic pelvis with tortuous bowel in this region and likely areas of bowel narrowing. Findings are similar to prior, except  that the degree of bowel dilation is slightly increased.  No evidence of a high-grade transition point. There is fluid and gas in more distal colon and more proximal small bowel is nondilated. Overall, findings may represent a mild/partial bowel obstruction. 2.  Atrophic native kidneys with numerous cystic lesions. Recommend nonemergent renal ultrasound for better characterization. 3.  Right lower quadrant renal transplant.  No hydronephrosis 4.  Diverticulosis without evidence of diverticulitis. Aorto-biiliac atherosclerosis.   Patient admitted 5 weeks ago with bloody stools associated with left abdominal bloating, pain to 10/10.  She had several episodes of nonbloody, non-coffee-ground, N/V.  She did not undergo abdominal/pelvic imaging or endo/colonoscopies.  Bleeding was attributed to diverticular source Hgb 9 at arrival, dipped to 7.7.  Transfused 1 U PRBCs.  Hgb 8.9 on 8/16.   No GI complaints or bleeding recurrence at GI ROV of 09/11/17.    Starting yesterday the patient developed upper respiratory symptoms of nasal congestion/drainage, pressure in her ears, sore throat and loose cough.  No dyspnea.  Feels hot and then chills.  She had recently been spending time with young children, ages 72 and 71 who were not displaying any signs of respiratory infection.  She was sent by her transplant doctor for labs this morning.  In the meantime, starting early this morning she had a total of 3 episodes of stool mixed with blood and  left-sided abdominal pain.  The symptoms are very similar but not as severe as what she had last month when the pain was worse.  There were a few episodes of non-coffee ground, nonbloody emesis. The only blood thinner the patient takes his aspirin.  She does not have reflux symptoms or GI troubles, appetite is good under normal circumstances.  She takes ranitidine daily.  No ETOH.    Current Hgb 11, MCV 99.  Normal platelets and WBCs.  Normal LFTs.  Stable mild renal insufficiency.   No fever.  No tachycardia.  Blood pressure elevated but not dangerously so.    Past Medical History:  Diagnosis Date  . Acute blood loss anemia   . Anemia   . Anxiety   . Arthritis   . Claustrophobia   . Diverticulosis   . ESRD (end stage renal disease) on dialysis ([place])    "TTS; [place]" (03/24/2015)  . GI bleed   . Glomerulonephritis   . Heart murmur   . HTN (hypertension)   . Hypothyroidism   . Kidney transplant recipient 01/21/2017  . Pancreatitis   . Renal insufficiency   . Secondary hyperparathyroidism ([place])    [name] 05/11/2014    Past Surgical History:  Procedure Laterality Date  . ANTERIOR CERVICAL CORPECTOMY N/A 05/24/2017   Procedure: CORPECTOMY CERVICAL FIVE- CERVICAL SIX;  Surgeon: Ashok Pall, MD;  Location: [facility];  Service: Neurosurgery;  Laterality: N/A;  . ARTERIOVENOUS GRAFT PLACEMENT Right 1990's?  . ARTERIOVENOUS GRAFT PLACEMENT Left 07/2002   upper arm/notes 06/02/2010  . ARTERIOVENOUS GRAFT PLACEMENT Right 07/2003   upper arm/notes 06/02/2010  . AV FISTULA PLACEMENT Left 09/2000   upper arm/notes 06/02/2010  . BREAST BIOPSY Left unsure   benign  . COLONOSCOPY    . DILATATION & CURRETTAGE/HYSTEROSCOPY WITH RESECTOCOPE N/A 04/17/2013   Procedure: DILATATION & CURETTAGE/HYSTEROSCOPY WITH RESECTOCOPE;  Surgeon: Marvene Staff, MD;  Location: [facility];  Service: Gynecology;  Laterality: N/A;  . DILATION AND CURETTAGE OF UTERUS    . EYE SURGERY    . KIDNEY TRANSPLANT  1997  . KIDNEY TRANSPLANT  01/21/2017   Dr. Harrington Challenger  . PARATHYROIDECTOMY  03/2000 05/12/2014   w/neck exploration & autotransplantation/notes 06/02/2010; w/neck exploration  . PARATHYROIDECTOMY N/A 05/12/2014   Procedure: PARATHYROIDECTOMY AND NECK EXPLORATION;  Surgeon: Armandina Gemma, MD;  Location: [facility];  Service: General;  Laterality: N/A;  . PERITONEAL CATHETER INSERTION    . PERITONEAL CATHETER REMOVAL  08/1999   [name] 06/02/2010  . POSTERIOR CERVICAL FUSION/FORAMINOTOMY N/A  05/30/2017   Procedure: POSTERIOR CERVICAL Arthrodesis Cervical three - four, cervical four - five, cervical five - six, cervical six - seven;  Surgeon: Ashok Pall, MD;  Location: [facility];  Service: Neurosurgery;  Laterality: N/A;  . POSTERIOR CERVICAL LAMINECTOMY N/A 08/12/2016   Procedure: POSTERIOR CERVICAL LAMINECTOMY CERVICAL THREE CERVICAL FOUR, CERVICAL FOUR CERVICAL FIVE, CERVICAL FIVE- CERVICAL SIX, CERVICAL SIX- CERVICAL SEVEN;  Surgeon: Ashok Pall, MD;  Location: [facility];  Service: Neurosurgery;  Laterality: N/A;  POSTERIOR  . RETINAL DETACHMENT SURGERY Left   . THROMBECTOMY Left 02/2002   fistula/notes 06/02/2010  . THROMBECTOMY / ARTERIOVENOUS GRAFT REVISION  11/2003; 01/2004; 08/07/2005; 08/10/2005; 10/2005   [name] 06/02/2010  . THROMBECTOMY AND REVISION OF ARTERIOVENTOUS (AV) GORETEX  GRAFT Right 01/2002; 11/2003; 01/2004; 08/07/2004; 08/10/2004; 11/13/2005   [name] 5/1/2012Marland Kitchen [name] 5/16/2012Marland Kitchen [name] 5/16/2012Marland Kitchen [name] 5/16/2012Marland Kitchen [name] 06/02/2010; [name] 06/02/2010  . THROMBECTOMY AND REVISION OF ARTERIOVENTOUS (AV) GORETEX  GRAFT Left 08/23/2002; 09/13/2002; 07/08/2003   /  notes 06/02/2010; [name] 06/02/2010; [name] 06/02/2010    Prior to Admission medications   Medication Sig Start Date End Date Taking? Authorizing Provider  acetaminophen (TYLENOL) 500 MG tablet Take 1,000 mg by mouth every 6 (six) hours as needed for moderate pain or headache.    Yes [provider]  ALPRAZolam (XANAX) 0.25 MG tablet Take 1 tablet (0.25 mg total) by mouth daily as needed for anxiety. 07/14/17  Yes Hoyt Koch, MD  gabapentin (NEURONTIN) 300 MG capsule Take 600 mg by mouth 2 (two) times daily as needed (nerve pain).  04/19/17  Yes [provider]  mycophenolate (MYFORTIC) 180 MG EC tablet Take 720 mg by mouth 2 (two) times daily.    Yes [provider]  oxyCODONE (OXY IR/ROXICODONE) 5 MG immediate release tablet Take 1-2 tablets (5-10 mg total) by mouth every 4 (four) hours as  needed for moderate pain. 06/01/17  Yes Ashok Pall, MD  predniSONE (DELTASONE) 2.5 MG tablet Take 7.5 mg by mouth every morning.    Yes [provider]  ranitidine (ZANTAC) 150 MG tablet Take 150 mg by mouth at bedtime. 09/15/17  Yes [provider]  sodium bicarbonate 650 MG tablet Take 650 mg by mouth daily.    Yes [provider]  sulfamethoxazole-trimethoprim (BACTRIM,SEPTRA) 400-80 MG tablet Take 1 tablet by mouth every Monday, Wednesday, and Friday.   Yes [provider]  tacrolimus (PROGRAF) 1 MG capsule Take 3-4 mg by mouth See admin instructions. Take four capsules by mouth in the morning and three capsules by mouth in the evening.   Yes [provider]  pantoprazole (PROTONIX) 40 MG tablet Take 1 tablet (40 mg total) by mouth daily. Patient not taking: Reported on 09/11/2017 08/23/17 09/22/17  Shelly Coss, MD    Scheduled Meds:  Infusions: . sodium chloride 1,000 mL (09/29/17 1222)   PRN Meds:    Allergies as of 09/29/2017 - Review Complete 09/29/2017  Allergen Reaction Noted  . Contrast media [iodinated diagnostic agents] Other (See Comments) 05/22/2017  . Nsaids Other (See Comments) 05/18/2017  . Penicillins Hives and Other (See Comments) 03/19/2013  . Ace inhibitors Hives and Nausea And Vomiting 03/19/2013  . Codeine Hives 03/19/2013    Family History  Adopted: Yes  Problem Relation Age of Onset  . Diabetes Maternal Aunt        x2  . Diabetes Maternal Uncle     Social History   Socioeconomic History  . Marital status: Divorced    Spouse name: Not on file  . Number of children: 1  . Years of education: Not on file  . Highest education level: Not on file  Occupational History  . Not on file  Social Needs  . Financial resource strain: Not hard at all  . Food insecurity:    Worry: Never true    Inability: Never true  . Transportation needs:    Medical: No    Non-medical: No  Tobacco Use  . Smoking status:  Former Smoker    Packs/day: 0.12    Years: 30.00    Pack years: 3.60    Types: Cigarettes    Last attempt to quit: 01/21/2017    Years since quitting: 0.6  . Smokeless tobacco: Never Used  Substance and Sexual Activity  . Alcohol use: No  . Drug use: No  . Sexual activity: Not Currently  Lifestyle  . Physical activity:    Days per week: 0 days    Minutes per session: 0  min  . Stress: Rather much  Relationships  . Social connections:    Talks on phone: More than three times a week    Gets together: More than three times a week    Attends religious service: More than 4 times per year    Active member of club or organization: Yes    Attends meetings of clubs or organizations: More than 4 times per year    Relationship status: Divorced  . Intimate partner violence:    Fear of current or ex partner: Not on file    Emotionally abused: Not on file    Physically abused: Not on file    Forced sexual activity: Not on file  Other Topics Concern  . Not on file  Social History Narrative  . Not on file    REVIEW OF SYSTEMS: Constitutional: Generally patient has good energy level and no weakness or fatigue.  In the last 24 to 36 hours she started to feel some malaise. ENT:  No nose bleeds Pulm: Loose cough; clear to frothy, nonpurulent sputum. CV:  No palpitations, no LE edema.  No chest pain GU:  No hematuria, no frequency GI: Generally patient does not have reflux symptoms.  No chronic abdominal pain.  No diarrhea or constipation. Heme: No excessive bleeding or bruising. Transfusions: Per HPI.   Neuro:  No dizziness, headaches, no peripheral tingling or numbness Derm:  No itching, no rash or sores.  Endocrine:  No polyuria or dysuria Immunization: Not queried Travel:  None beyond local counties in last few months.    PHYSICAL EXAM: Vital signs in last 24 hours: Vitals:   09/29/17 0900 09/29/17 1152  BP: (!) 173/79 (!) 187/87  Pulse: 95 86  Resp: 16 18  Temp: 98.3 F (36.8  C)   SpO2: 100% 98%   Wt Readings from Last 3 Encounters:  09/11/17 70.6 kg  09/04/17 69.9 kg  08/23/17 66.9 kg    General: Pleasant, looks well.  Comfortable. Head: No facial asymmetry or swelling.  No signs of head trauma. Eyes: No scleral icterus.  No conjunctival pallor.  EOMI. Ears: Not hard of hearing.  Speculum exam of the right ear without erythema.  Some earwax evident. Nose: Sounds congested.  No visible drainage. Mouth: Moist, pink, clear oral mucosa.  No exudates or pharyngeal erythema.  Tongue midline.  Good dentition. Neck: No JVD, no masses, no thyromegaly.  Scar from spring 2019 C spine surgery intact.  Lungs: Clear bilaterally.  No dyspnea.  Loose cough present. Heart: RRR.  No MRG.  S1, S2 present Abdomen: Soft.  Mild to moderate tenderness without guarding or rebound in the left mid to lower abdomen.  No HSM, no masses.  Some fullness on the right lower quadrant in the region housing transplanted kidney..   Rectal: Deferred Musc/Skeltl: No joint redness, swelling, gross deformity. Extremities: No CCE. Neurologic: Alert.  Oriented x3.  Moves all 4 limbs without tremor.  Limb strength not tested. Skin: No rashes or open sores. Tattoos: None visualized. Nodes: No cervical or inguinal adenopathy. Psych: Calm, pleasant, fluid speech.  Good historian.  Intake/Output from previous day: No intake/output data recorded. Intake/Output this shift: No intake/output data recorded.  LAB RESULTS: Recent Labs    09/29/17 0925  WBC 8.6  HGB 11.0*  HCT 36.8  PLT 248   BMET Lab Results  Component Value Date   NA 137 09/29/2017   NA 135 08/23/2017   NA 135 08/22/2017   K 4.9 09/29/2017  K 4.7 08/23/2017   K 5.0 08/22/2017   CL 109 09/29/2017   CL 108 08/23/2017   CL 106 08/22/2017   CO2 17 (L) 09/29/2017   CO2 18 (L) 08/23/2017   CO2 18 (L) 08/22/2017   GLUCOSE 104 (H) 09/29/2017   GLUCOSE 78 08/23/2017   GLUCOSE 86 08/22/2017   BUN 19 09/29/2017   BUN 14  08/23/2017   BUN 17 08/22/2017   CREATININE 1.22 (H) 09/29/2017   CREATININE 1.24 (H) 08/23/2017   CREATININE 1.21 (H) 08/22/2017   CALCIUM 10.3 09/29/2017   CALCIUM 9.5 08/23/2017   CALCIUM 9.3 08/22/2017   LFT Recent Labs    09/29/17 0925  PROT 7.5  ALBUMIN 4.1  AST 23  ALT 12  ALKPHOS 80  BILITOT 0.8   PT/INR Lab Results  Component Value Date   INR 1.08 05/16/2014   Lipase     Component Value Date/Time   LIPASE 44 03/24/2015 0356    RADIOLOGY STUDIES: No results found.   IMPRESSION:   *   Bloody diarrhea and left-sided abdominal pain, recurrent.  Had same but more severe symptoms 5 weeks ago. Possibly diverticular bleed vs ischemic colitis (higher risk for latter given documented aorto-iliac dz on CT from 2017 and 06/2017).    *   Mild normocytic anemia.    *   S/p 01/2017 renal transplant for ESRD, prior transplant in 1990s. Stable, mild, renal insufficiency/CKD per BMET.    *   URI.  Suspect viral.  No fevers or leukocytosis.     PLAN:     *   ? Image abdomen/pelvis with CT.  Dr Bryan Lemma to see pt and is covering the weekend.    *  Ok for clears, I wrote the order.       Azucena Freed  09/29/2017, 12:28 PM Phone [phone]   Attending physician's note   I have taken an interval history, reviewed the chart and examined the patient. I agree with the Advanced Practitioner's note, impression, and recommendations as outlined. Essentially, she woke at 2:30 to urinate, but had a BM with marroon colored stools, then woke again around 5:30 with BRBPR and a 3rd episode of BRBPR around 7:00, after which she had some mild LLQ cramping. She has not had a BM since then and the pain is largely resolved. She had an admission in August with a presumed diverticular bleed, managed conservatively with 1U pRBCs and monitoring, without any colonoscopy or IR procedure. She has since had no overt GI blood loss until this AM. She was otherwise tolerating PO intake yesterday  and no recent changes in medications, activity levels, fluid intake. No associated f/c, SOB, CP. Did have some nausea with non-bloody emesis which has not recurred since presenting to the ER.  On presentation to the ER, her Hgb was 11 (from 8.9 and baseline 8-9) and without hypotension (BP was elevated) or tachycardia.    Discussed the ddx for her hematochezia at length, to include recurrent diverticular bleed (last colonoscopy was 2015 and n/f severe pandiverticular disease), ischemic colitis, AVMs, etc. Discussed options for tx, to include flex sig, colonoscopy, tagged RBC/CT Angio, observation. Given her HD stability, lack of ongoing bleeding, Hgb higher than baseline, she would like to proceed with admission for observation, which is reasonable.   - Observe for e/o recurrent bleeding - Serial Hgb/Hct for now - Ok to resume clears - If recurrent bleeding, may consider flex sig with enema vs colonoscopy given her reluctance  to drink bowel prep - Will continue to follow  Gerrit Heck, DO, FACG [phone] office

## 2017-09-29 NOTE — H&P (Addendum)
History and Physical    [name] GEX:[id number] DOB: 03/17/1960 DOA: 09/29/2017  PCP: Hoyt Koch, MD Patient coming from: home  Chief Complaint: GI bleed  HPI: [name] is a 57 y.o. female with medical history significant for diverticulosis, renal transplant in January 2019, anxiety hypertension who presents with acute GI bleed since this morning.  Patient reports noticing a maroon-red blood with bowel movement since 2:30 AM this morning.  She reports 3 bowel movement with blood in them.  She had minimal left lower quadrant pain.  She also reports lightheadedness but denies passing out or fall.   Of note, she also reports cough, runny nose, sore throat and chills since yesterday.  She called her nephrologist this morning and was advised to go to [facility] lab for blood work.  She was told she did not have elevated white blood cells but sent to ED for further evaluation. She states her cough is productive with yellowish phlegm.  She denies hemoptysis.  She denies fever, chest pain, emesis or dysuria.  Admits some headache, chills and nausea.  Denies smoking cigarettes, drinking alcohol or recreational drug use. In ED, vital signs significant for elevated BP.  Orthostatic vitals negative.  CMP with metabolic acidosis likely chronic.  Creatinine 1.22 (baseline).  Hemoglobin 11.0 (baseline 8-9).  Hemoccult positive.  Was given a liter of normal saline.  Typed and screened.  GI consulted medicine was called to admit patient.  Review of Systems: Review of Systems  Constitutional: Positive for chills. Negative for diaphoresis, fever and weight loss.  HENT: Positive for sore throat. Negative for nosebleeds.        Rhinorrhea  Eyes: Negative for blurred vision, photophobia and pain.  Respiratory: Positive for cough and sputum production. Negative for hemoptysis, shortness of breath and wheezing.   Cardiovascular: Negative for chest pain, palpitations, leg swelling and PND.    Gastrointestinal: Positive for abdominal pain, blood in stool and nausea. Negative for diarrhea, melena and vomiting.  Genitourinary: Negative for dysuria, frequency and urgency.  Musculoskeletal: Negative for myalgias.  Skin: Negative for rash.  Neurological: Negative for speech change, focal weakness, weakness and headaches.  Endo/Heme/Allergies: Does not bruise/bleed easily.  Psychiatric/Behavioral: Negative for depression and substance abuse. The patient is not nervous/anxious.    PMH Past Medical History:  Diagnosis Date  . Acute blood loss anemia   . Anemia   . Anxiety   . Arthritis   . Claustrophobia   . Diverticulosis   . ESRD (end stage renal disease) on dialysis ([place])    "TTS; [place]" (03/24/2015)  . GI bleed   . Glomerulonephritis   . Heart murmur   . HTN (hypertension)   . Hypothyroidism   . Kidney transplant recipient 01/21/2017  . Pancreatitis   . Renal insufficiency   . Secondary hyperparathyroidism ([place])    [name] 05/11/2014   PSH Past Surgical History:  Procedure Laterality Date  . ANTERIOR CERVICAL CORPECTOMY N/A 05/24/2017   Procedure: CORPECTOMY CERVICAL FIVE- CERVICAL SIX;  Surgeon: Ashok Pall, MD;  Location: [place];  Service: Neurosurgery;  Laterality: N/A;  . ARTERIOVENOUS GRAFT PLACEMENT Right 1990's?  . ARTERIOVENOUS GRAFT PLACEMENT Left 07/2002   upper arm/notes 06/02/2010  . ARTERIOVENOUS GRAFT PLACEMENT Right 07/2003   upper arm/notes 06/02/2010  . AV FISTULA PLACEMENT Left 09/2000   upper arm/notes 06/02/2010  . BREAST BIOPSY Left unsure   benign  . COLONOSCOPY    . DILATATION & CURRETTAGE/HYSTEROSCOPY WITH RESECTOCOPE N/A 04/17/2013  Procedure: [place];  Surgeon: Marvene Staff, MD;  Location: [facility];  Service: Gynecology;  Laterality: N/A;  . DILATION AND CURETTAGE OF UTERUS    . EYE SURGERY    . KIDNEY TRANSPLANT  1997  . KIDNEY TRANSPLANT  01/21/2017   Dr. Harrington Challenger  .  PARATHYROIDECTOMY  03/2000 05/12/2014   w/neck exploration & autotransplantation/notes 06/02/2010; w/neck exploration  . PARATHYROIDECTOMY N/A 05/12/2014   Procedure: PARATHYROIDECTOMY AND NECK EXPLORATION;  Surgeon: Armandina Gemma, MD;  Location: [place];  Service: General;  Laterality: N/A;  . PERITONEAL CATHETER INSERTION    . PERITONEAL CATHETER REMOVAL  08/1999   [name] 06/02/2010  . POSTERIOR CERVICAL FUSION/FORAMINOTOMY N/A 05/30/2017   Procedure: POSTERIOR CERVICAL Arthrodesis Cervical three - four, cervical four - five, cervical five - six, cervical six - seven;  Surgeon: Ashok Pall, MD;  Location: [place];  Service: Neurosurgery;  Laterality: N/A;  . POSTERIOR CERVICAL LAMINECTOMY N/A 08/12/2016   Procedure: POSTERIOR CERVICAL LAMINECTOMY CERVICAL THREE CERVICAL FOUR, CERVICAL FOUR CERVICAL FIVE, CERVICAL FIVE- CERVICAL SIX, CERVICAL SIX- CERVICAL SEVEN;  Surgeon: Ashok Pall, MD;  Location: [facility];  Service: Neurosurgery;  Laterality: N/A;  POSTERIOR  . RETINAL DETACHMENT SURGERY Left   . THROMBECTOMY Left 02/2002   fistula/notes 06/02/2010  . THROMBECTOMY / ARTERIOVENOUS GRAFT REVISION  11/2003; 01/2004; 08/07/2005; 08/10/2005; 10/2005   [name] 06/02/2010  . THROMBECTOMY AND REVISION OF ARTERIOVENTOUS (AV) GORETEX  GRAFT Right 01/2002; 11/2003; 01/2004; 08/07/2004; 08/10/2004; 11/13/2005   [name] 5/1/2012Marland Kitchen [name] 5/16/2012Marland Kitchen [name] 5/16/2012Marland Kitchen [name] 5/16/2012Marland Kitchen [name] 06/02/2010; [name] 06/02/2010  . THROMBECTOMY AND REVISION OF ARTERIOVENTOUS (AV) GORETEX  GRAFT Left 08/23/2002; 09/13/2002; 07/08/2003   [name] 06/02/2010; [name] 06/02/2010; [name] 06/02/2010   Fam HX Family History  Adopted: Yes  Problem Relation Age of Onset  . Diabetes Maternal Aunt        x2  . Diabetes Maternal Uncle    Social Hx  reports that she quit smoking about 8 months ago. Her smoking use included cigarettes. She has a 3.60 pack-year smoking history. She has never used smokeless tobacco. She reports that she does not drink  alcohol or use drugs.  Allergy Allergies  Allergen Reactions  . Contrast Media [Iodinated Diagnostic Agents] Other (See Comments)    Cant take due to kidney transplant  . Nsaids Other (See Comments)    Cant take due to kidney transplant   . Penicillins Hives and Other (See Comments)    PATIENT HAS HAD A PCN REACTION WITH IMMEDIATE RASH, FACIAL/TONGUE/THROAT SWELLING, SOB, OR LIGHTHEADEDNESS WITH HYPOTENSION:  #  #  YES  #  #  Has patient had a PCN reaction causing severe rash involving mucus membranes or skin necrosis: No Has patient had a PCN reaction that required hospitalization No Has patient had a PCN reaction occurring within the last 10 years: No If all of the above answers are "NO", then may proceed with Cephalosporin  . Ace Inhibitors Hives and Nausea And Vomiting  . Codeine Hives   Home Meds Prior to Admission medications   Medication Sig Start Date End Date Taking? Authorizing Provider  acetaminophen (TYLENOL) 500 MG tablet Take 1,000 mg by mouth every 6 (six) hours as needed for moderate pain or headache.    Yes [provider]  ALPRAZolam (XANAX) 0.25 MG tablet Take 1 tablet (0.25 mg total) by mouth daily as needed for anxiety. 07/14/17  Yes Hoyt Koch, MD  gabapentin (NEURONTIN) 300 MG capsule Take 600 mg by mouth 2 (  two) times daily as needed (nerve pain).  04/19/17  Yes [provider]  mycophenolate (MYFORTIC) 180 MG EC tablet Take 720 mg by mouth 2 (two) times daily.    Yes [provider]  oxyCODONE (OXY IR/ROXICODONE) 5 MG immediate release tablet Take 1-2 tablets (5-10 mg total) by mouth every 4 (four) hours as needed for moderate pain. 06/01/17  Yes Ashok Pall, MD  predniSONE (DELTASONE) 2.5 MG tablet Take 7.5 mg by mouth every morning.    Yes [provider]  ranitidine (ZANTAC) 150 MG tablet Take 150 mg by mouth at bedtime. 09/15/17  Yes [provider]  sodium bicarbonate 650 MG tablet Take 650 mg by mouth  daily.    Yes [provider]  sulfamethoxazole-trimethoprim (BACTRIM,SEPTRA) 400-80 MG tablet Take 1 tablet by mouth every Monday, Wednesday, and Friday.   Yes [provider]  tacrolimus (PROGRAF) 1 MG capsule Take 3-4 mg by mouth See admin instructions. Take four capsules by mouth in the morning and three capsules by mouth in the evening.   Yes [provider]  pantoprazole (PROTONIX) 40 MG tablet Take 1 tablet (40 mg total) by mouth daily. Patient not taking: Reported on 09/11/2017 08/23/17 09/22/17  Shelly Coss, MD    Physical Exam: Vitals:   09/29/17 0900 09/29/17 1152 09/29/17 1304  BP: (!) 173/79 (!) 187/87 (!) 175/80  Pulse: 95 86 90  Resp: 16 18 17   Temp: 98.3 F (36.8 C)    TempSrc: Oral    SpO2: 100% 98% 96%    Constitutional: NAD, calm, comfortable Eyes: lids and conjunctivae normal ENMT: hearing grossly intact. No rhinorrhea. MMM. Neck: normal, supple, no masses, no thyromegaly Resp: normal effort. Good air movement bilaterally. No wheeze, rales or rhonchi.  CVS: RRR. S1 & S2 heard.  3/6 SEM RUSB. No edema.  AV fistula in right arm with thrills and bruits GI: normal bowel sound. Mild tenderness over LLQ. No distention MSK: No obvious deformity. No focal tenderness. Moving extremities. Skin: no apparent lesion. Normal warmth. Neuro: Awake. Alert. Oriented appropriately. CN 2-12 grossly intact. Light sensation intact. Normal strength. DTR symmetric. Psych: Calm. Normal judgment and insight  Labs on Admission: I have personally reviewed following labs and imaging studies  CBC: Recent Labs  Lab 09/29/17 0925  WBC 8.6  HGB 11.0*  HCT 36.8  MCV 99.7  PLT 510   Basic Metabolic Panel: Recent Labs  Lab 09/29/17 0925  NA 137  K 4.9  CL 109  CO2 17*  GLUCOSE 104*  BUN 19  CREATININE 1.22*  CALCIUM 10.3   GFR: CrCl cannot be calculated (Unknown ideal weight.). Liver Function Tests: Recent Labs  Lab 09/29/17 0925  AST 23  ALT  12  ALKPHOS 80  BILITOT 0.8  PROT 7.5  ALBUMIN 4.1   No results for input(s): LIPASE, AMYLASE in the last 168 hours. No results for input(s): AMMONIA in the last 168 hours. Coagulation Profile: No results for input(s): INR, PROTIME in the last 168 hours. Cardiac Enzymes: No results for input(s): CKTOTAL, CKMB, CKMBINDEX, TROPONINI in the last 168 hours. BNP (last 3 results) No results for input(s): PROBNP in the last 8760 hours. HbA1C: No results for input(s): HGBA1C in the last 72 hours. CBG: No results for input(s): GLUCAP in the last 168 hours. Lipid Profile: No results for input(s): CHOL, HDL, LDLCALC, TRIG, CHOLHDL, LDLDIRECT in the last 72 hours. Thyroid Function Tests: No results for input(s): TSH, T4TOTAL, FREET4, T3FREE, THYROIDAB in the last 72  hours. Anemia Panel: No results for input(s): VITAMINB12, FOLATE, FERRITIN, TIBC, IRON, RETICCTPCT in the last 72 hours. Urine analysis: No results found for: COLORURINE, APPEARANCEUR, LABSPEC, PHURINE, GLUCOSEU, HGBUR, BILIRUBINUR, KETONESUR, PROTEINUR, UROBILINOGEN, NITRITE, LEUKOCYTESUR  Sepsis Labs: Not applicable )No results found for this or any previous visit (from the past 240 hour(s)).   Radiological Exams on Admission: No results found.  All images have been reviewed by me personally.    Assessment/Plan Principal Problem:   GI bleeding Active Problems:   HTN (hypertension), benign   Hypothyroidism   Anxiety   Kidney transplant recipient   GI bleed: Likely diverticular. Doubt diverticulitis.  Constitutional symptoms likely from viral URI.  Patient with recent hospitalization for similar problem about a month ago.  Hemodynamically stable.  Hemoccult positive.  Hemoglobin 11.0.  Baseline 8-9.  Status post IV fluid in ED.  GI consulted. -Admit to MedSurg -2 PIV lines -Type and screen -H&H this evening -Start PPI -Follow GI recs  Symptomatic anemia: Likely due to above.  Hemoglobin 11.0.  Baseline 8-9.   Orthostatic vitals within normal -Plan as above  History of kidney transplant: On Myfortic and Prograf.  Last dose yesterday. -Continue home meds  URI: Likely viral. -RVP -CXR -Droplet precaution  Anxiety: On Xanax 0.25 mg as needed -Continue home Xanax  Hypertension: does not appear to be on any medication at home.  -Now with GI bleed.  CKD-3: Creatinine 1.22.  At baseline. -Recheck BMP in the morning  Other chronic conditions stable  DVT prophylaxis: SCD in the setting of GI bleed Code Status: Full code Family Communication: No family member at bedside Disposition Plan: Admit to [facility] called: Rheumatology Admission status: Inpatient status   Addendum:  Received a fax from her transplant to doctor's office.  One evening from nephrology.  From the note, it appears patient had tacrolimus trough checked this morning which was elevated to 9.6. Her last dose was last night. Based on that, they suggested changing her Tacrolimus dose to 3 mg twice a day.   Mercy Riding MD [facility] Pager [phone]  If 7PM-7AM, please contact night-coverage [email or web address] Password [email or web address]  09/29/2017, 1:07 PM

## 2017-09-29 NOTE — ED Notes (Signed)
Pt was put on purewick, tolerated well.

## 2017-09-29 NOTE — Progress Notes (Signed)
Patient running temp of 101 and also complaining of headache but no PRN Tylenol on board. [place] NP notified with an order for Tylenol. Will administer and continue to monitor.

## 2017-09-29 NOTE — ED Triage Notes (Signed)
Pt in c/o GI bleeding that started this am, history of diverticulitis, denies abdominal pain but reports weakness, bowel movements have been diarrhea with dark red blood noted

## 2017-09-29 NOTE — ED Notes (Signed)
Paged admitting per RN  

## 2017-09-29 NOTE — ED Provider Notes (Signed)
[facility] EMERGENCY DEPARTMENT Provider Note   CSN: [id number] Arrival date & time: 09/29/17  0855     History   Chief Complaint Chief Complaint  Patient presents with  . GI Bleeding    HPI [name] is a 57 y.o. female with a past medical history of diverticulosis, renal transplant in January 2019, who presents to ED for evaluation of 3 episodes of bright red blood clots with loose bowel movements that began this morning.  Reports some vague abdominal pressure but denies any pain.  States that this feels similar to when she had a diverticular bleed on 08/20/2017.  She received a blood transfusion as a result of this.  She last had a colonoscopy in 2015.  States that she feels lightheaded, dizzy and nauseous.  Denies any vomiting or hematemesis.  Denies any suspicious food ingestions.  She called [facility] who advised her to come to the ED.  Reports taking a baby aspirin daily.  Denies any other blood thinner use.  Denies any fever, injuries or falls, urinary symptoms or shortness of breath.  HPI  Past Medical History:  Diagnosis Date  . Acute blood loss anemia   . Anemia   . Anxiety   . Arthritis   . Claustrophobia   . Diverticulosis   . ESRD (end stage renal disease) on dialysis ([place])    "TTS; [facility]" (03/24/2015)  . GI bleed   . Glomerulonephritis   . Heart murmur   . HTN (hypertension)   . Hypothyroidism   . Kidney transplant recipient 01/21/2017  . Pancreatitis   . Renal insufficiency   . Secondary hyperparathyroidism ([place])    [name] 05/11/2014    Patient Active Problem List   Diagnosis Date Noted  . History of GI diverticular bleed 09/11/2017  . Lower GI bleed   . Acute blood loss anemia   . Diverticulosis   . 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 S/P spinal surgery 05/24/2017  . Other secondary kyphosis, cervical region 05/24/2017  . Kidney transplant recipient 01/21/2017  . Anxiety 11/21/2016  . Stenosis of cervical  spine with myelopathy ([place]) 08/12/2016  . Routine general medical examination at a health care facility 06/01/2016  . Numbness and tingling in both hands 05/18/2015  . Hypothyroidism 03/24/2015  . History of tachycardia 03/24/2015  . HTN (hypertension), benign 06/12/2013  . ESRD on dialysis [facility]) 06/12/2013    Past Surgical History:  Procedure Laterality Date  . ANTERIOR CERVICAL CORPECTOMY N/A 05/24/2017   Procedure: CORPECTOMY CERVICAL FIVE- CERVICAL SIX;  Surgeon: Ashok Pall, MD;  Location: [facility];  Service: Neurosurgery;  Laterality: N/A;  . ARTERIOVENOUS GRAFT PLACEMENT Right 1990's?  . ARTERIOVENOUS GRAFT PLACEMENT Left 07/2002   upper arm/notes 06/02/2010  . ARTERIOVENOUS GRAFT PLACEMENT Right 07/2003   upper arm/notes 06/02/2010  . AV FISTULA PLACEMENT Left 09/2000   upper arm/notes 06/02/2010  . BREAST BIOPSY Left unsure   benign  . COLONOSCOPY    . DILATATION & CURRETTAGE/HYSTEROSCOPY WITH RESECTOCOPE N/A 04/17/2013   Procedure: DILATATION & CURETTAGE/HYSTEROSCOPY WITH RESECTOCOPE;  Surgeon: Marvene Staff, MD;  Location: [facility];  Service: Gynecology;  Laterality: N/A;  . DILATION AND CURETTAGE OF UTERUS    . EYE SURGERY    . KIDNEY TRANSPLANT  1997  . KIDNEY TRANSPLANT  01/21/2017   Dr. Harrington Challenger  . PARATHYROIDECTOMY  03/2000 05/12/2014   w/neck exploration & autotransplantation/notes 06/02/2010; w/neck exploration  . PARATHYROIDECTOMY N/A 05/12/2014   Procedure: PARATHYROIDECTOMY AND  NECK EXPLORATION;  Surgeon: Armandina Gemma, MD;  Location: [facility];  Service: General;  Laterality: N/A;  . PERITONEAL CATHETER INSERTION    . PERITONEAL CATHETER REMOVAL  08/1999   [name] 06/02/2010  . POSTERIOR CERVICAL FUSION/FORAMINOTOMY N/A 05/30/2017   Procedure: POSTERIOR CERVICAL Arthrodesis Cervical three - four, cervical four - five, cervical five - six, cervical six - seven;  Surgeon: Ashok Pall, MD;  Location: [facility];  Service: Neurosurgery;  Laterality: N/A;  . POSTERIOR CERVICAL  LAMINECTOMY N/A 08/12/2016   Procedure: POSTERIOR CERVICAL LAMINECTOMY CERVICAL THREE CERVICAL FOUR, CERVICAL FOUR CERVICAL FIVE, CERVICAL FIVE- CERVICAL SIX, CERVICAL SIX- CERVICAL SEVEN;  Surgeon: Ashok Pall, MD;  Location: [facility];  Service: Neurosurgery;  Laterality: N/A;  POSTERIOR  . RETINAL DETACHMENT SURGERY Left   . THROMBECTOMY Left 02/2002   fistula/notes 06/02/2010  . THROMBECTOMY / ARTERIOVENOUS GRAFT REVISION  11/2003; 01/2004; 08/07/2005; 08/10/2005; 10/2005   [name] 06/02/2010  . THROMBECTOMY AND REVISION OF ARTERIOVENTOUS (AV) GORETEX  GRAFT Right 01/2002; 11/2003; 01/2004; 08/07/2004; 08/10/2004; 11/13/2005   [name] 5/1/2012Marland Kitchen [name] 5/16/2012Marland Kitchen [name] 5/16/2012Marland Kitchen [name] 5/16/2012Marland Kitchen [name] 06/02/2010; [name] 06/02/2010  . THROMBECTOMY AND REVISION OF ARTERIOVENTOUS (AV) GORETEX  GRAFT Left 08/23/2002; 09/13/2002; 07/08/2003   [name] 06/02/2010; [name] 06/02/2010; [name] 06/02/2010     OB History   None      Home Medications    Prior to Admission medications   Medication Sig Start Date End Date Taking? Authorizing Provider  acetaminophen (TYLENOL) 500 MG tablet Take 1,000 mg by mouth every 6 (six) hours as needed for moderate pain or headache.    Yes [provider]  ALPRAZolam (XANAX) 0.25 MG tablet Take 1 tablet (0.25 mg total) by mouth daily as needed for anxiety. 07/14/17  Yes Hoyt Koch, MD  gabapentin (NEURONTIN) 300 MG capsule Take 600 mg by mouth 2 (two) times daily as needed (nerve pain).  04/19/17  Yes [provider]  mycophenolate (MYFORTIC) 180 MG EC tablet Take 720 mg by mouth 2 (two) times daily.    Yes [provider]  oxyCODONE (OXY IR/ROXICODONE) 5 MG immediate release tablet Take 1-2 tablets (5-10 mg total) by mouth every 4 (four) hours as needed for moderate pain. 06/01/17  Yes Ashok Pall, MD  predniSONE (DELTASONE) 2.5 MG tablet Take 7.5 mg by mouth every morning.    Yes [provider]  ranitidine (ZANTAC) 150 MG tablet Take  150 mg by mouth at bedtime. 09/15/17  Yes [provider]  sodium bicarbonate 650 MG tablet Take 650 mg by mouth daily.    Yes [provider]  sulfamethoxazole-trimethoprim (BACTRIM,SEPTRA) 400-80 MG tablet Take 1 tablet by mouth every Monday, Wednesday, and Friday.   Yes [provider]  tacrolimus (PROGRAF) 1 MG capsule Take 3-4 mg by mouth See admin instructions. Take four capsules by mouth in the morning and three capsules by mouth in the evening.   Yes [provider]  pantoprazole (PROTONIX) 40 MG tablet Take 1 tablet (40 mg total) by mouth daily. Patient not taking: Reported on 09/11/2017 08/23/17 09/22/17  Shelly Coss, MD    Family History Family History  Adopted: Yes  Problem Relation Age of Onset  . Diabetes Maternal Aunt        x2  . Diabetes Maternal Uncle     Social History Social History   Tobacco Use  . Smoking status: Former Smoker    Packs/day: 0.12    Years: 30.00    Pack years: 3.60    Types:  Cigarettes    Last attempt to quit: 01/21/2017    Years since quitting: 0.6  . Smokeless tobacco: Never Used  Substance Use Topics  . Alcohol use: No  . Drug use: No     Allergies   Contrast media [iodinated diagnostic agents]; Nsaids; Penicillins; Ace inhibitors; and Codeine   Review of Systems Review of Systems  Constitutional: Negative for appetite change, chills and fever.  HENT: Negative for ear pain, rhinorrhea, sneezing and sore throat.   Eyes: Negative for photophobia and visual disturbance.  Respiratory: Negative for cough, chest tightness, shortness of breath and wheezing.   Cardiovascular: Negative for chest pain and palpitations.  Gastrointestinal: Positive for abdominal pain, blood in stool and nausea. Negative for constipation, diarrhea and vomiting.  Genitourinary: Negative for dysuria, hematuria and urgency.  Musculoskeletal: Negative for myalgias.  Skin: Negative for rash.  Neurological: Positive for dizziness.  Negative for weakness.     Physical Exam Updated Vital Signs BP (!) 187/87 (BP Location: Left Arm)   Pulse 86   Temp 98.3 F (36.8 C) (Oral)   Resp 18   SpO2 98%   Physical Exam  Constitutional: She appears well-developed and well-nourished. No distress.  HENT:  Head: Normocephalic and atraumatic.  Nose: Nose normal.  Eyes: Conjunctivae and EOM are normal. Right eye exhibits no discharge. Left eye exhibits no discharge. No scleral icterus.  Neck: Normal range of motion. Neck supple.  Cardiovascular: Normal rate, regular rhythm, normal heart sounds and intact distal pulses. Exam reveals no gallop and no friction rub.  No murmur heard. Pulmonary/Chest: Effort normal and breath sounds normal. No respiratory distress.  Abdominal: Soft. Bowel sounds are normal. She exhibits no distension. There is tenderness. There is no rebound and no guarding.  Mild generalized lower abdominal tenderness to palpation without rebound or guarding noted.  Genitourinary:  Genitourinary Comments: Light pink stool noted in rectal vault with rectal exam. No external hemorrhoids or fissures noted.  Musculoskeletal: Normal range of motion. She exhibits no edema.  Neurological: She is alert. She exhibits normal muscle tone. Coordination normal.  Skin: Skin is warm and dry. No rash noted.  Psychiatric: She has a normal mood and affect.  Nursing note and vitals reviewed.    ED Treatments / Results  Labs (all labs ordered are listed, but only abnormal results are displayed) Labs Reviewed  COMPREHENSIVE METABOLIC PANEL - Abnormal; Notable for the following components:      Result Value   CO2 17 (*)    Glucose, Bld 104 (*)    Creatinine, Ser 1.22 (*)    GFR calc non Af Amer 49 (*)    GFR calc Af Amer 56 (*)    All other components within normal limits  CBC - Abnormal; Notable for the following components:   RBC 3.69 (*)    Hemoglobin 11.0 (*)    MCHC 29.9 (*)    All other components within normal  limits  POC OCCULT BLOOD, ED - Abnormal; Notable for the following components:   Fecal Occult Bld POSITIVE (*)    All other components within normal limits  TYPE AND SCREEN    EKG None  Radiology No results found.  Procedures Procedures (including critical care time)  Medications Ordered in ED Medications  sodium chloride 0.9 % bolus 1,000 mL (1,000 mLs Intravenous New Bag/Given 09/29/17 1222)  ondansetron (ZOFRAN) injection 4 mg (4 mg Intravenous Given 09/29/17 1223)     Initial Impression / Assessment and Plan / ED Course  I have reviewed the triage vital signs and the nursing notes.  Pertinent labs & imaging results that were available during my care of the patient were reviewed by me and considered in my medical decision making (see chart for details).  Clinical Course as of Sep 30 1246  Fri Sep 29, 2017  1023 Hemoglobin 10.7 noted on chart review on 09/15/2017.   [HK]    Clinical Course User Index [HK] Delia Heady, PA-C    57 year old female with a past medical history of diverticulosis, renal transplant in 2019 presents to ED for evaluation of 3 episodes of bright red blood clots with loose bowel movements that began this morning.  Reports vague "pressure but denies any pain.  She has a history of diverticular bleed on 08/20/2017 that she states began similarly.  She feels lightheaded, dizzy and nauseous.  Denies suspicious food ingestions.  Her GI specialist told her to come to the ED.  On exam patient appears overall well.  No significant abdominal tenderness palpation on although mild Lee noted.  No rebound or guarding noted.  Vital signs remained within normal limits.  Hemoccult positive with light red/pink stool noted in rectal vault.  Creatinine 1.2 which appears similar to baseline.  Hemoglobin is 11.  Due to patient's risk factors, history of diverticular bleed last month which required transfusion, patient will need to be admitted for observation.  [facility] to see on  consult.  Hospitalist to admit.  Portions of this note were generated with [employer]obby[employer]. Dictation errors may occur despite best attempts at proofreading.   Final Clinical Impressions(s) / ED Diagnoses   Final diagnoses:  Lower GI bleed    ED Discharge Orders    None       Delia Heady, PA-C 09/29/17 1252    Gareth Morgan, MD 09/30/17 [phone]

## 2017-09-29 NOTE — Telephone Encounter (Signed)
Left message for pt to call back.  Spoke with pt and she states she has had a couple of bowel movements this morning with BRB. States she thinks she is having another diverticular bleed. Discussed with pt that she should go to the ER to be evaluated. Pt verbalized understanding.

## 2017-09-30 ENCOUNTER — Other Ambulatory Visit: Payer: Self-pay

## 2017-09-30 ENCOUNTER — Encounter (HOSPITAL_COMMUNITY): Payer: Self-pay | Admitting: *Deleted

## 2017-09-30 DIAGNOSIS — D62 Acute posthemorrhagic anemia: Secondary | ICD-10-CM

## 2017-09-30 DIAGNOSIS — F419 Anxiety disorder, unspecified: Secondary | ICD-10-CM

## 2017-09-30 LAB — CBC
HCT: 39.4 % (ref 36.0–46.0)
HEMATOCRIT: 37.8 % (ref 36.0–46.0)
HEMOGLOBIN: 11.7 g/dL — AB (ref 12.0–15.0)
Hemoglobin: 12.2 g/dL (ref 12.0–15.0)
MCH: 29.8 pg (ref 26.0–34.0)
MCH: 30 pg (ref 26.0–34.0)
MCHC: 31 g/dL (ref 30.0–36.0)
MCHC: 31 g/dL (ref 30.0–36.0)
MCV: 96.4 fL (ref 78.0–100.0)
MCV: 97 fL (ref 78.0–100.0)
PLATELETS: 268 10*3/uL (ref 150–400)
Platelets: 228 10*3/uL (ref 150–400)
RBC: 3.92 MIL/uL (ref 3.87–5.11)
RBC: 4.06 MIL/uL (ref 3.87–5.11)
RDW: 14.6 % (ref 11.5–15.5)
RDW: 14.5 % (ref 11.5–15.5)
WBC: 7.3 10*3/uL (ref 4.0–10.5)
WBC: 6.1 10*3/uL (ref 4.0–10.5)

## 2017-09-30 LAB — URINALYSIS, ROUTINE W REFLEX MICROSCOPIC
Bacteria, UA: NONE SEEN
Bilirubin Urine: NEGATIVE
Glucose, UA: NEGATIVE mg/dL
Ketones, ur: NEGATIVE mg/dL
Leukocytes, UA: NEGATIVE
Nitrite: NEGATIVE
Protein, ur: NEGATIVE mg/dL
SPECIFIC GRAVITY, URINE: 1.008 (ref 1.005–1.030)
pH: 6 (ref 5.0–8.0)

## 2017-09-30 LAB — BASIC METABOLIC PANEL
ANION GAP: 8 (ref 5–15)
BUN: 12 mg/dL (ref 6–20)
CALCIUM: 10.2 mg/dL (ref 8.9–10.3)
CO2: 19 mmol/L — AB (ref 22–32)
Chloride: 107 mmol/L (ref 98–111)
Creatinine, Ser: 1.25 mg/dL — ABNORMAL HIGH (ref 0.44–1.00)
GFR calc non Af Amer: 47 mL/min — ABNORMAL LOW (ref >60)
GFR, EST AFRICAN AMERICAN: 55 mL/min — AB (ref >60)
Glucose, Bld: 100 mg/dL — ABNORMAL HIGH (ref 70–99)
Potassium: 5.4 mmol/L — ABNORMAL HIGH (ref 3.5–5.1)
Sodium: 134 mmol/L — ABNORMAL LOW (ref 135–145)

## 2017-09-30 LAB — PROTIME-INR
INR: 1.09
Prothrombin Time: 14 seconds (ref 11.4–15.2)

## 2017-09-30 LAB — APTT: aPTT: 32 seconds (ref 24–36)

## 2017-09-30 MED ORDER — OXYMETAZOLINE HCL 0.05 % NA SOLN
1.0000 | Freq: Two times a day (BID) | NASAL | Status: DC
  Administered 2017-09-30 – 2017-10-01 (×3): 1 via NASAL
  Filled 2017-09-30: qty 15

## 2017-09-30 MED ORDER — SODIUM POLYSTYRENE SULFONATE 15 GM/60ML PO SUSP
30.0000 g | Freq: Once | ORAL | Status: AC
  Administered 2017-09-30: 30 g via ORAL
  Filled 2017-09-30: qty 120

## 2017-09-30 MED ORDER — PREDNISONE 5 MG PO TABS
7.5000 mg | ORAL_TABLET | Freq: Every morning | ORAL | Status: DC
  Administered 2017-09-30 – 2017-10-01 (×2): 7.5 mg via ORAL
  Filled 2017-09-30 (×2): qty 2

## 2017-09-30 MED ORDER — SODIUM BICARBONATE 650 MG PO TABS
650.0000 mg | ORAL_TABLET | Freq: Every day | ORAL | Status: DC
  Administered 2017-09-30 – 2017-10-01 (×2): 650 mg via ORAL
  Filled 2017-09-30 (×2): qty 1

## 2017-09-30 MED ORDER — FLUTICASONE PROPIONATE 50 MCG/ACT NA SUSP
1.0000 | Freq: Every day | NASAL | Status: DC
  Administered 2017-09-30 – 2017-10-01 (×2): 1 via NASAL
  Filled 2017-09-30: qty 16

## 2017-09-30 MED ORDER — SULFAMETHOXAZOLE-TRIMETHOPRIM 400-80 MG PO TABS: 1.0000 | ORAL_TABLET | ORAL | Status: DC

## 2017-09-30 MED ORDER — FAMOTIDINE 20 MG PO TABS
20.0000 mg | ORAL_TABLET | Freq: Every day | ORAL | Status: DC
  Administered 2017-10-01: 20 mg via ORAL
  Filled 2017-09-30: qty 1

## 2017-09-30 NOTE — Progress Notes (Signed)
[facility] ROUNDING NOTE   Subjective: No acute events overnight. No more hematochezia. No abdominal pain and tolerating PO intake. Placed on droplet precautions for URI sxs.    Objective: Vital signs in last 24 hours: Temp:  [99.7 F (37.6 C)-101 F (38.3 C)] 100.3 F (37.9 C) (09/14 0708) Pulse Rate:  [86-102] 101 (09/14 0708) Resp:  [15-20] 16 (09/14 0708) BP: (136-187)/(73-93) 136/73 (09/14 0708) SpO2:  [96 %-100 %] 100 % (09/14 0708) Last BM Date: 09/29/17 General: NAD Abdomen: Soft, NT, ND Ext: No c/c/e    Intake/Output from previous day: 09/13 0701 - 09/14 0700 In: 696.3 [IV Piggyback:696.3] Out: -  Intake/Output this shift: No intake/output data recorded.   Lab Results: Recent Labs    09/29/17 0925 09/29/17 1906 09/30/17 0915  WBC 8.6  --  7.3  HGB 11.0* 11.4* 11.7*  PLT 248  --  228  MCV 99.7  --  96.4   BMET Recent Labs    09/29/17 0925 09/30/17 0915  NA 137 134*  K 4.9 5.4*  CL 109 107  CO2 17* 19*  GLUCOSE 104* 100*  BUN 19 12  CREATININE 1.22* 1.25*  CALCIUM 10.3 10.2   LFT Recent Labs    09/29/17 0925  PROT 7.5  ALBUMIN 4.1  AST 23  ALT 12  ALKPHOS 80  BILITOT 0.8   PT/INR Recent Labs    09/30/17 0915  INR 1.09      Imaging/Other results: Ct Abdomen Pelvis Wo Contrast  Result Date: 09/29/2017 CLINICAL DATA:  Bloody bowel movements this morning. EXAM: CT ABDOMEN AND PELVIS WITHOUT CONTRAST TECHNIQUE: Multidetector CT imaging of the abdomen and pelvis was performed following the standard protocol without IV contrast. COMPARISON:  CT scan 03/24/2015 FINDINGS: Lower chest: The lung bases are clear. No pleural effusion. The heart is normal in size. Age advanced coronary artery calcifications. Distal esophagus is grossly normal. Hepatobiliary: No focal hepatic lesions or intrahepatic biliary dilatation. Gallbladder appears normal. No common bile duct dilatation. Calcifications are noted around the inferior margin of  the right lobe. Pancreas: No mass, inflammation or ductal dilatation. Spleen: Normal size. No focal lesions. Calcifications noted on the capsule of the spleen. Adrenals/Urinary Tract: The adrenal glands are unremarkable and stable. Marked atrophy of both kidneys. Most of the large cysts have resolved. There is a transplant kidney in the right pelvis. The bladder is unremarkable. Stomach/Bowel: The stomach, duodenum, small bowel and colon are grossly normal without oral contrast. No acute inflammatory changes, mass lesions or obstructive findings. Fairly extensive colonic diverticulosis but no findings for acute diverticulitis. The terminal ileum and appendix appear normal. Vascular/Lymphatic: Extensive vascular calcifications, particularly for the patient's age. No focal aneurysm. No mesenteric or retroperitoneal mass or adenopathy. Reproductive: Slightly enlarged fibroid uterus. The ovaries are grossly normal. Other: No pelvic mass or free pelvic fluid collections. No inguinal mass or adenopathy. No subcutaneous lesions. Musculoskeletal: No significant bony findings. Changes suggestive of renal osteodystrophy. Advanced degenerative changes involving the lower thoracic spine. IMPRESSION: 1. No acute abdominal/pelvic findings, mass lesions or adenopathy. 2. Diffuse colonic diverticulosis but no findings for acute diverticulitis and no evidence for mass or acute inflammatory process. 3. Small scarred kidneys. Polycystic kidney disease noted on the prior study with largely resolved cysts. 4. 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 5. Markedly age advanced atherosclerotic calcifications involving the aorta and branch vessels. Electronically Signed   By: Marijo Sanes M.D.   On: 09/29/2017 17:14   Dg Chest 2 View  Result Date: 09/29/2017 CLINICAL DATA:  positive swab for sinus infection today, started running a fever today as well - having sinus congestion and sore throat - also  having issues with diverticulitis today as well - hx of htn, renal issues, pancreatitis, kidney transplant, heart murmur EXAM: CHEST - 2 VIEW COMPARISON:  04/22/2014 FINDINGS: Lungs are clear. Heart size upper limits normal. No effusion. Interval cervical corpectomy and instrumented fusion, incompletely visualized. Vascular stents in the right upper arm and axilla. Surgical clips in the left axilla. IMPRESSION: Borderline cardiomegaly.  No acute findings. Electronically Signed   By: Lucrezia Europe M.D.   On: 09/29/2017 15:27      Assessment and Recommmendations: 1) Hematochezia: Suspect self limiting diverticular bleed without hemodynamic change.  Serial hemoglobin stable overnight and no further bleeding.  No plan for endoscopic intervention at this time  - Can liberalize serial CBCs as she is no longer with overt GI bleed - Okay from GI service to advance diet to previous home diet - We will sign off at this time.  Please do not hesitate to contact the on-call GI with additional questions or concerns, or if concern for recurrent bleeding. -Okay to follow-up in the GI clinic following hospital discharge.   Lavena Bullion, DO  09/30/2017, 10:36 AM [facility] Pager [phone]

## 2017-09-30 NOTE — Progress Notes (Addendum)
PROGRESS NOTE        PATIENT DETAILS Name: [name] Age: 57 y.o. Sex: female Date of Birth: [birth year] Admit Date: 09/29/2017 Admitting Physician Mercy Riding, MD QIW:[id number], Real Cons, MD  Brief Narrative: Patient is a 57 y.o. 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  Subjective: No hematochezia overnight-last hematochezia was yesterday evening/last night.  Denies any abdominal pain.  Assessment/Plan: Lower GI bleeding: Likely recurrence of diverticular bleeding-seems to have slowed down-no bleeding overnight.  Continue to monitor-GI following.  Mild acute blood loss anemia: No indication of transfusion-repeating labs today.  Continue to follow CBC.  Fever: Does complain of nasal congestion-some cough-chest x-ray negative, respiratory virus panel positive for rhinovirus. Check UA and blood cultures-she does not appear acutely ill-however she is immunocompromised-continue careful inpatient monitoring.  Renal transplantation January 2019: Renal function stable-resume prednisone and other immunosuppressants.  GERD: Stable-continue Pepcid  Anxiety: Stable-continue Xanax  DVT Prophylaxis: SCD's  Code Status: Full code   Family Communication: None at bedside  Disposition Plan: Remain inpatient-hopefully home soon if no further GI bleeding-most likely tomorrow.  Antimicrobial agents: Anti-infectives (From admission, onward)   None      Procedures: None  CONSULTS:  GI  Time spent: 25- minutes-Greater than 50% of this time was spent in counseling, explanation of diagnosis, planning of further management, and coordination of care.  MEDICATIONS: Scheduled Meds: . mycophenolate  720 mg Oral BID  . tacrolimus  3 mg Oral  QHS  . tacrolimus  4 mg Oral q morning - 10a   Continuous Infusions: . famotidine (PEPCID) IV 20 mg (09/30/17 0914)   PRN Meds:.acetaminophen, ALPRAZolam, gabapentin   PHYSICAL EXAM: Vital signs: Vitals:   09/29/17 1758 09/29/17 2137 09/29/17 2339 09/30/17 0708  BP: (!) 165/86 (!) 151/81  136/73  Pulse: 95 (!) 102  (!) 101  Resp: 19 20  16   Temp: 99.7 F (37.6 C) (!) 101 F (38.3 C) 100.2 F (37.9 C) 100.3 F (37.9 C)  TempSrc: Oral Oral Oral Oral  SpO2: 100% 100%  100%   There were no vitals filed for this visit. There is no height or weight on file to calculate BMI.   General appearance :Awake, alert, not in any distress. Speech Clear. Not toxic Looking Eyes:, pupils equally reactive to light and accomodation,no scleral icterus.Pink conjunctiva HEENT: Atraumatic and Normocephalic Neck: supple, no JVD. No cervical lymphadenopathy. No thyromegaly Resp:Good air entry bilaterally, no added sounds  CVS: S1 S2 regular, no murmurs.  GI: Bowel sounds present, Non tender and not distended with no gaurding, rigidity or rebound.No organomegaly Extremities: B/L Lower Ext shows no edema, both legs are warm to touch Neurology:  speech clear,Non focal, sensation is grossly intact. Psychiatric: Normal judgment and insight. Alert and oriented x 3. Normal mood. Musculoskeletal:No digital cyanosis Skin:No Rash, warm and dry Wounds:N/A  I have personally reviewed following labs and imaging studies  LABORATORY DATA: CBC: Recent Labs  Lab 09/29/17 0925 09/29/17 1906  WBC 8.6  --   HGB 11.0* 11.4*  HCT 36.8 37.4  MCV 99.7  --   PLT 248  --     Basic Metabolic Panel: Recent Labs  Lab 09/29/17 0925  NA 137  K 4.9  CL 109  CO2 17*  GLUCOSE 104*  BUN 19  CREATININE 1.22*  CALCIUM 10.3    GFR: CrCl cannot be calculated (Unknown ideal weight.).  Liver Function Tests: Recent Labs  Lab 09/29/17 0925  AST 23  ALT 12  ALKPHOS 80  BILITOT 0.8  PROT 7.5  ALBUMIN 4.1     No results for input(s): LIPASE, AMYLASE in the last 168 hours. No results for input(s): AMMONIA in the last 168 hours.  Coagulation Profile: No results for input(s): INR, PROTIME in the last 168 hours.  Cardiac Enzymes: No results for input(s): CKTOTAL, CKMB, CKMBINDEX, TROPONINI in the last 168 hours.  BNP (last 3 results) No results for input(s): PROBNP in the last 8760 hours.  HbA1C: No results for input(s): HGBA1C in the last 72 hours.  CBG: No results for input(s): GLUCAP in the last 168 hours.  Lipid Profile: No results for input(s): CHOL, HDL, LDLCALC, TRIG, CHOLHDL, LDLDIRECT in the last 72 hours.  Thyroid Function Tests: No results for input(s): TSH, T4TOTAL, FREET4, T3FREE, THYROIDAB in the last 72 hours.  Anemia Panel: No results for input(s): VITAMINB12, FOLATE, FERRITIN, TIBC, IRON, RETICCTPCT in the last 72 hours.  Urine analysis: No results found for: COLORURINE, APPEARANCEUR, LABSPEC, PHURINE, GLUCOSEU, HGBUR, BILIRUBINUR, KETONESUR, PROTEINUR, UROBILINOGEN, NITRITE, LEUKOCYTESUR  Sepsis Labs: Lactic Acid, Venous No results found for: LATICACIDVEN  MICROBIOLOGY: Recent Results (from the past 240 hour(s))  Respiratory Panel by PCR     Status: Abnormal   Collection Time: 09/29/17  4:03 PM  Result Value Ref Range Status   Adenovirus NOT DETECTED NOT DETECTED Final   Coronavirus 229E NOT DETECTED NOT DETECTED Final   Coronavirus HKU1 NOT DETECTED NOT DETECTED Final   Coronavirus NL63 NOT DETECTED NOT DETECTED Final   Coronavirus OC43 NOT DETECTED NOT DETECTED Final   Metapneumovirus NOT DETECTED NOT DETECTED Final   Rhinovirus / Enterovirus DETECTED (A) NOT DETECTED Final   Influenza A NOT DETECTED NOT DETECTED Final   Influenza B NOT DETECTED NOT DETECTED Final   Parainfluenza Virus 1 NOT DETECTED NOT DETECTED Final   Parainfluenza Virus 2 NOT DETECTED NOT DETECTED Final   Parainfluenza Virus 3 NOT DETECTED NOT DETECTED Final   Parainfluenza Virus  4 NOT DETECTED NOT DETECTED Final   Respiratory Syncytial Virus NOT DETECTED NOT DETECTED Final   Bordetella pertussis NOT DETECTED NOT DETECTED Final   Chlamydophila pneumoniae NOT DETECTED NOT DETECTED Final   Mycoplasma pneumoniae NOT DETECTED NOT DETECTED Final    Comment: Performed at [facility], [address] [place], [place] [address]    RADIOLOGY STUDIES/RESULTS: Ct Abdomen Pelvis Wo Contrast  Result Date: 09/29/2017 CLINICAL DATA:  Bloody bowel movements this morning. EXAM: CT ABDOMEN AND PELVIS WITHOUT CONTRAST TECHNIQUE: Multidetector CT imaging of the abdomen and pelvis was performed following the standard protocol without IV contrast. COMPARISON:  CT scan 03/24/2015 FINDINGS: Lower chest: The lung bases are clear. No pleural effusion. The heart is normal in size. Age advanced coronary artery calcifications. Distal esophagus is grossly normal. Hepatobiliary: No focal hepatic lesions or intrahepatic biliary dilatation. Gallbladder appears normal. No common bile duct dilatation. Calcifications are noted around the inferior margin of the right lobe. Pancreas: No mass, inflammation or ductal dilatation. Spleen: Normal size. No focal lesions. Calcifications noted on the capsule of the spleen. Adrenals/Urinary Tract: The adrenal glands are unremarkable and stable. Marked atrophy of both kidneys. Most of the large cysts have resolved. There is a transplant kidney  in the right pelvis. The bladder is unremarkable. Stomach/Bowel: The stomach, duodenum, small bowel and colon are grossly normal without oral contrast. No acute inflammatory changes, mass lesions or obstructive findings. Fairly extensive colonic diverticulosis but no findings for acute diverticulitis. The terminal ileum and appendix appear normal. Vascular/Lymphatic: Extensive vascular calcifications, particularly for the patient's age. No focal aneurysm. No mesenteric or retroperitoneal mass or adenopathy. Reproductive: Slightly  enlarged fibroid uterus. The ovaries are grossly normal. Other: No pelvic mass or free pelvic fluid collections. No inguinal mass or adenopathy. No subcutaneous lesions. Musculoskeletal: No significant bony findings. Changes suggestive of renal osteodystrophy. Advanced degenerative changes involving the lower thoracic spine. IMPRESSION: 1. No acute abdominal/pelvic findings, mass lesions or adenopathy. 2. Diffuse colonic diverticulosis but no findings for acute diverticulitis and no evidence for mass or acute inflammatory process. 3. Small scarred kidneys. Polycystic kidney disease noted on the prior study with largely resolved cysts. 4. 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 5. Markedly age advanced atherosclerotic calcifications involving the aorta and branch vessels. Electronically Signed   By: Marijo Sanes M.D.   On: 09/29/2017 17:14   Dg Chest 2 View  Result Date: 09/29/2017 CLINICAL DATA:  positive swab for sinus infection today, started running a fever today as well - having sinus congestion and sore throat - also having issues with diverticulitis today as well - hx of htn, renal issues, pancreatitis, kidney transplant, heart murmur EXAM: CHEST - 2 VIEW COMPARISON:  04/22/2014 FINDINGS: Lungs are clear. Heart size upper limits normal. No effusion. Interval cervical corpectomy and instrumented fusion, incompletely visualized. Vascular stents in the right upper arm and axilla. Surgical clips in the left axilla. IMPRESSION: Borderline cardiomegaly.  No acute findings. Electronically Signed   By: Lucrezia Europe M.D.   On: 09/29/2017 15:27     LOS: 1 day   Oren Binet, MD  [facility]  If 7PM-7AM, please contact night-coverage  Please page via [email or web address]-click on MD name and type text message  09/30/2017, 9:39 AM

## 2017-10-01 DIAGNOSIS — K922 Gastrointestinal hemorrhage, unspecified: Secondary | ICD-10-CM

## 2017-10-01 DIAGNOSIS — E032 Hypothyroidism due to medicaments and other exogenous substances: Secondary | ICD-10-CM

## 2017-10-01 LAB — BLOOD CULTURE ID PANEL (REFLEXED)
Acinetobacter baumannii: NOT DETECTED
CANDIDA KRUSEI: NOT DETECTED
CANDIDA PARAPSILOSIS: NOT DETECTED
CANDIDA TROPICALIS: NOT DETECTED
Candida albicans: NOT DETECTED
Candida glabrata: NOT DETECTED
ENTEROBACTER CLOACAE COMPLEX: NOT DETECTED
ESCHERICHIA COLI: NOT DETECTED
Enterobacteriaceae species: NOT DETECTED
Enterococcus species: NOT DETECTED
Haemophilus influenzae: NOT DETECTED
KLEBSIELLA PNEUMONIAE: NOT DETECTED
Klebsiella oxytoca: NOT DETECTED
Listeria monocytogenes: NOT DETECTED
Methicillin resistance: NOT DETECTED
NEISSERIA MENINGITIDIS: NOT DETECTED
PROTEUS SPECIES: NOT DETECTED
Pseudomonas aeruginosa: NOT DETECTED
STAPHYLOCOCCUS SPECIES: DETECTED — AB
Serratia marcescens: NOT DETECTED
Staphylococcus aureus (BCID): NOT DETECTED
Streptococcus agalactiae: NOT DETECTED
Streptococcus pneumoniae: NOT DETECTED
Streptococcus pyogenes: NOT DETECTED
Streptococcus species: NOT DETECTED

## 2017-10-01 LAB — BASIC METABOLIC PANEL
Anion gap: 9 (ref 5–15)
BUN: 16 mg/dL (ref 6–20)
CO2: 21 mmol/L — ABNORMAL LOW (ref 22–32)
CREATININE: 1.26 mg/dL — AB (ref 0.44–1.00)
Calcium: 10.3 mg/dL (ref 8.9–10.3)
Chloride: 103 mmol/L (ref 98–111)
GFR calc Af Amer: 54 mL/min — ABNORMAL LOW (ref >60)
GFR calc non Af Amer: 47 mL/min — ABNORMAL LOW (ref >60)
Glucose, Bld: 120 mg/dL — ABNORMAL HIGH (ref 70–99)
Potassium: 5.3 mmol/L — ABNORMAL HIGH (ref 3.5–5.1)
Sodium: 133 mmol/L — ABNORMAL LOW (ref 135–145)

## 2017-10-01 LAB — MAGNESIUM: MAGNESIUM: 1.7 mg/dL (ref 1.7–2.4)

## 2017-10-01 LAB — CBC
HCT: 39.8 % (ref 36.0–46.0)
Hemoglobin: 12.1 g/dL (ref 12.0–15.0)
MCH: 29.2 pg (ref 26.0–34.0)
MCHC: 30.4 g/dL (ref 30.0–36.0)
MCV: 95.9 fL (ref 78.0–100.0)
PLATELETS: 222 10*3/uL (ref 150–400)
RBC: 4.15 MIL/uL (ref 3.87–5.11)
RDW: 14.3 % (ref 11.5–15.5)
WBC: 5.3 10*3/uL (ref 4.0–10.5)

## 2017-10-01 MED ORDER — POLYETHYLENE GLYCOL 3350 17 G PO PACK
17.0000 g | PACK | Freq: Two times a day (BID) | ORAL | Status: DC
  Administered 2017-10-01: 17 g via ORAL
  Filled 2017-10-01: qty 1

## 2017-10-01 MED ORDER — DOCUSATE SODIUM 100 MG PO CAPS: 100.0000 mg | ORAL_CAPSULE | Freq: Two times a day (BID) | ORAL | 0 refills | Status: AC

## 2017-10-01 MED ORDER — SODIUM POLYSTYRENE SULFONATE 15 GM/60ML PO SUSP
30.0000 g | Freq: Once | ORAL | Status: AC
  Administered 2017-10-01: 30 g via ORAL
  Filled 2017-10-01: qty 120

## 2017-10-01 MED ORDER — POLYETHYLENE GLYCOL 3350 17 G PO PACK: 17.0000 g | PACK | Freq: Every day | ORAL | 0 refills | Status: DC

## 2017-10-01 NOTE — Discharge Summary (Signed)
CARISHA KANTOR OIZ:[id number] DOB: 14-Feb-1960 DOA: 09/29/2017  PCP: Hoyt Koch, MD  Admit date: 09/29/2017  Discharge date: 10/01/2017  Admitted From: Home   Disposition:  Home   Recommendations for Outpatient Follow-up:   Follow up with PCP in 1-2 weeks  PCP Please obtain BMP/CBC, 2 view CXR in 1week,  (see Discharge instructions)   PCP Please follow up on the following pending results:    Home Health: None   Equipment/Devices: None  Consultations: GI Discharge Condition: Stable   CODE STATUS: Fu;;   Diet Recommendation: Heart Healthy    Chief Complaint  Patient presents with  . GI Bleeding     Brief history of present illness from the day of admission and additional interim summary    Patient is a 57 y.o. 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                                                                 Hospital Course    Lower GI bleeding: Likely recurrence of diverticular bleeding-seems to have slowed down-no bleeding in the last 2 days, H&H is actually trending up, will be placed on daily MiraLAX along with Colace stool softener and discharged home with close outpatient PCP and GI follow-up post discharge, she is completely symptom-free and eager to go home.  Mild acute blood loss anemia: No indication of transfusion-repeating labs today.  Trend going up.  Fever: Does complain of nasal congestion-some cough-chest x-ray negative, respiratory panel was positive for rhinovirus, blood cultures 1 out of 2 coag negative staph aureus contamination, UA unremarkable.  Afebrile  and symptom-free.  Appears nontoxic will be discharged on home medications unchanged.  Renal transplantation January 2019: Renal function stable-resume prednisone and other immunosuppressants.  GERD: Stable-continue Pepcid  Anxiety: Stable-continue Xanax  Mild recurrent and chronic hyperkalemia.  Likely due to underlying Bactrim.  Given Kayexalate, placed on bowel regimen, follow with PCP 1 to 2 days.   Discharge diagnosis     Principal Problem:   GI bleeding Active Problems:   HTN (hypertension), benign   Hypothyroidism   Anxiety   Kidney transplant recipient    Discharge instructions    Discharge Instructions    Diet - low sodium heart healthy   Complete by:  As directed    Discharge instructions   Complete by:  As directed    Follow with Primary MD Hoyt Koch, MD in 2-3 days and your gastroenterologist within a week, Get CBC, BMP  by Primary MD  in 2-3 days    Activity: As tolerated with Full fall precautions use walker/cane & assistance as needed  Disposition Home    Diet:  Heart Healthy    For Heart failure patients - Check your Weight same time everyday, if you gain over 2 pounds, or you develop in leg swelling, experience more shortness of breath or chest pain, call your Primary MD immediately. Follow Cardiac Low Salt Diet and 1.5 lit/day fluid restriction.  Special Instructions: If you have smoked or chewed Tobacco  in the last 2 yrs please stop smoking, stop any regular Alcohol  and or any Recreational drug use.  On your next visit with your primary care physician please Get Medicines reviewed and adjusted.  Please request your Prim.MD to go over all Hospital Tests and Procedure/Radiological results at the follow up, please get all Hospital records sent to your Prim MD by signing hospital release before you go home.  If you experience worsening of your admission symptoms, develop shortness of breath, life threatening emergency, suicidal or  homicidal thoughts you must seek medical attention immediately by calling 911 or calling your MD immediately  if symptoms less severe.   Increase activity slowly   Complete by:  As directed       Discharge Medications   Allergies as of 10/01/2017      Reactions   Contrast Media [iodinated Diagnostic Agents] Other (See Comments)   Cant take due to kidney transplant   Nsaids Other (See Comments)   Cant take due to kidney transplant    Penicillins Hives, Other (See Comments)   PATIENT HAS HAD A PCN REACTION WITH IMMEDIATE RASH, FACIAL/TONGUE/THROAT SWELLING, SOB, OR LIGHTHEADEDNESS WITH HYPOTENSION:  #  #  YES  #  #  Has patient had a PCN reaction causing severe rash involving mucus membranes or skin necrosis: No Has patient had a PCN reaction that required hospitalization No Has patient had a PCN reaction occurring within the last 10 years: No If all of the above answers are "NO", then may proceed with Cephalosporin   Ace Inhibitors Hives, Nausea And Vomiting   Codeine Hives      Medication List    TAKE these medications   acetaminophen 500 MG tablet Commonly known as:  TYLENOL Take 1,000 mg by mouth every 6 (six) hours as needed for moderate pain or headache.   ALPRAZolam 0.25 MG tablet Commonly known as:  XANAX Take 1 tablet (0.25 mg total) by mouth daily as needed for anxiety.   docusate sodium 100 MG capsule Commonly known as:  COLACE Take 1 capsule (100 mg total) by mouth 2 (two) times daily for 15 days.   gabapentin 300 MG capsule Commonly known as:  NEURONTIN Take 600 mg by mouth 2 (two) times daily as needed (nerve pain).   mycophenolate 180 MG EC tablet Commonly known as:  MYFORTIC Take 720 mg by mouth 2 (two) times daily.   oxyCODONE 5 MG immediate release tablet Commonly known as:  Oxy IR/ROXICODONE Take 1-2 tablets (5-10 mg total) by mouth every 4 (four) hours as needed for moderate pain.   pantoprazole 40 MG tablet Commonly known as:  PROTONIX Take 1  tablet (40 mg total) by mouth daily.   polyethylene glycol packet Commonly known as:  MIRALAX / GLYCOLAX Take 17 g by mouth daily.   predniSONE 2.5 MG tablet Commonly known as:  DELTASONE Take 7.5 mg by mouth every morning.   ranitidine 150 MG tablet Commonly known as:  ZANTAC Take 150 mg by mouth at bedtime.   sodium bicarbonate 650 MG tablet Take 650 mg by mouth daily.  sulfamethoxazole-trimethoprim 400-80 MG tablet Commonly known as:  BACTRIM,SEPTRA Take 1 tablet by mouth every Monday, Wednesday, and Friday.   tacrolimus 1 MG capsule Commonly known as:  PROGRAF Take 3-4 mg by mouth See admin instructions. Take four capsules by mouth in the morning and three capsules by mouth in the evening.       Follow-up Information    Hoyt Koch, MD. Schedule an appointment as soon as possible for a visit in 2 day(s).   Specialty:  Internal Medicine Contact information: [place] [address] [phone]        Jackquline Denmark, MD. Schedule an appointment as soon as possible for a visit in 1 week(s).   Specialties:  Gastroenterology, Internal Medicine Contact information: [place] [address] [phone]           Major procedures and Radiology Reports - PLEASE review detailed and final reports thoroughly  -       Ct Abdomen Pelvis Wo Contrast  Result Date: 09/29/2017 CLINICAL DATA:  Bloody bowel movements this morning. EXAM: CT ABDOMEN AND PELVIS WITHOUT CONTRAST TECHNIQUE: Multidetector CT imaging of the abdomen and pelvis was performed following the standard protocol without IV contrast. COMPARISON:  CT scan 03/24/2015 FINDINGS: Lower chest: The lung bases are clear. No pleural effusion. The heart is normal in size. Age advanced coronary artery calcifications. Distal esophagus is grossly normal. Hepatobiliary: No focal hepatic lesions or intrahepatic biliary dilatation. Gallbladder appears normal. No  common bile duct dilatation. Calcifications are noted around the inferior margin of the right lobe. Pancreas: No mass, inflammation or ductal dilatation. Spleen: Normal size. No focal lesions. Calcifications noted on the capsule of the spleen. Adrenals/Urinary Tract: The adrenal glands are unremarkable and stable. Marked atrophy of both kidneys. Most of the large cysts have resolved. There is a transplant kidney in the right pelvis. The bladder is unremarkable. Stomach/Bowel: The stomach, duodenum, small bowel and colon are grossly normal without oral contrast. No acute inflammatory changes, mass lesions or obstructive findings. Fairly extensive colonic diverticulosis but no findings for acute diverticulitis. The terminal ileum and appendix appear normal. Vascular/Lymphatic: Extensive vascular calcifications, particularly for the patient's age. No focal aneurysm. No mesenteric or retroperitoneal mass or adenopathy. Reproductive: Slightly enlarged fibroid uterus. The ovaries are grossly normal. Other: No pelvic mass or free pelvic fluid collections. No inguinal mass or adenopathy. No subcutaneous lesions. Musculoskeletal: No significant bony findings. Changes suggestive of renal osteodystrophy. Advanced degenerative changes involving the lower thoracic spine. IMPRESSION: 1. No acute abdominal/pelvic findings, mass lesions or adenopathy. 2. Diffuse colonic diverticulosis but no findings for acute diverticulitis and no evidence for mass or acute inflammatory process. 3. Small scarred kidneys. Polycystic kidney disease noted on the prior study with largely resolved cysts. 4. 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 5. Markedly age advanced atherosclerotic calcifications involving the aorta and branch vessels. Electronically Signed   By: Marijo Sanes M.D.   On: 09/29/2017 17:14   Dg Chest 2 View  Result Date: 09/29/2017 CLINICAL DATA:  positive swab for sinus infection today,  started running a fever today as well - having sinus congestion and sore throat - also having issues with diverticulitis today as well - hx of htn, renal issues, pancreatitis, kidney transplant, heart murmur EXAM: CHEST - 2 VIEW COMPARISON:  04/22/2014 FINDINGS: Lungs are clear. Heart size upper limits normal. No effusion. Interval cervical corpectomy and instrumented fusion, incompletely visualized. Vascular stents in the right  upper arm and axilla. Surgical clips in the left axilla. IMPRESSION: Borderline cardiomegaly.  No acute findings. Electronically Signed   By: Lucrezia Europe M.D.   On: 09/29/2017 15:27    Micro Results     Recent Results (from the past 240 hour(s))  Respiratory Panel by PCR     Status: Abnormal   Collection Time: 09/29/17  4:03 PM  Result Value Ref Range Status   Adenovirus NOT DETECTED NOT DETECTED Final   Coronavirus 229E NOT DETECTED NOT DETECTED Final   Coronavirus HKU1 NOT DETECTED NOT DETECTED Final   Coronavirus NL63 NOT DETECTED NOT DETECTED Final   Coronavirus OC43 NOT DETECTED NOT DETECTED Final   Metapneumovirus NOT DETECTED NOT DETECTED Final   Rhinovirus / Enterovirus DETECTED (A) NOT DETECTED Final   Influenza A NOT DETECTED NOT DETECTED Final   Influenza B NOT DETECTED NOT DETECTED Final   Parainfluenza Virus 1 NOT DETECTED NOT DETECTED Final   Parainfluenza Virus 2 NOT DETECTED NOT DETECTED Final   Parainfluenza Virus 3 NOT DETECTED NOT DETECTED Final   Parainfluenza Virus 4 NOT DETECTED NOT DETECTED Final   Respiratory Syncytial Virus NOT DETECTED NOT DETECTED Final   Bordetella pertussis NOT DETECTED NOT DETECTED Final   Chlamydophila pneumoniae NOT DETECTED NOT DETECTED Final   Mycoplasma pneumoniae NOT DETECTED NOT DETECTED Final    Comment: Performed at [facility], [address] [place], [place] [address]  Culture, blood (routine x 2)     Status: None (Preliminary result)   Collection Time: 09/30/17 11:01 AM  Result Value Ref Range  Status   Specimen Description BLOOD BLOOD LEFT HAND  Final   Special Requests   Final    BOTTLES DRAWN AEROBIC ONLY Blood Culture adequate volume   Culture  Setup Time   Final    GRAM POSITIVE COCCI AEROBIC BOTTLE ONLY CRITICAL RESULT CALLED TO, READ BACK BY AND VERIFIED WITH: Ailene Rud [id number] MLM Performed at [facility], [place] [address] [place], [place] [address]    Culture GRAM POSITIVE COCCI  Final   Report Status PENDING  Incomplete  Blood Culture ID Panel (Reflexed)     Status: Abnormal   Collection Time: 09/30/17 11:01 AM  Result Value Ref Range Status   Enterococcus species NOT DETECTED NOT DETECTED Final   Listeria monocytogenes NOT DETECTED NOT DETECTED Final   Staphylococcus species DETECTED (A) NOT DETECTED Final    Comment: Methicillin (oxacillin) susceptible coagulase negative staphylococcus. Possible blood culture contaminant (unless isolated from more than one blood culture draw or clinical case suggests pathogenicity). No antibiotic treatment is indicated for blood  culture contaminants. CRITICAL RESULT CALLED TO, READ BACK BY AND VERIFIED WITH: PHARMD M Butler [id number] MLM    Staphylococcus aureus NOT DETECTED NOT DETECTED Final   Methicillin resistance NOT DETECTED NOT DETECTED Final   Streptococcus species NOT DETECTED NOT DETECTED Final   Streptococcus agalactiae NOT DETECTED NOT DETECTED Final   Streptococcus pneumoniae NOT DETECTED NOT DETECTED Final   Streptococcus pyogenes NOT DETECTED NOT DETECTED Final   Acinetobacter baumannii NOT DETECTED NOT DETECTED Final   Enterobacteriaceae species NOT DETECTED NOT DETECTED Final   Enterobacter cloacae complex NOT DETECTED NOT DETECTED Final   Escherichia coli NOT DETECTED NOT DETECTED Final   Klebsiella oxytoca NOT DETECTED NOT DETECTED Final   Klebsiella pneumoniae NOT DETECTED NOT DETECTED Final   Proteus species NOT DETECTED NOT DETECTED Final   Serratia marcescens NOT DETECTED NOT DETECTED  Final  Haemophilus influenzae NOT DETECTED NOT DETECTED Final   Neisseria meningitidis NOT DETECTED NOT DETECTED Final   Pseudomonas aeruginosa NOT DETECTED NOT DETECTED Final   Candida albicans NOT DETECTED NOT DETECTED Final   Candida glabrata NOT DETECTED NOT DETECTED Final   Candida krusei NOT DETECTED NOT DETECTED Final   Candida parapsilosis NOT DETECTED NOT DETECTED Final   Candida tropicalis NOT DETECTED NOT DETECTED Final    Comment: Performed at [facility], [place] [address], [place], [place] [address]    Today   Subjective    Opha Tetzlaff today has no headache,no chest abdominal pain,no new weakness tingling or numbness, feels much better wants to go home today.    Objective   Blood pressure (!) 150/75, pulse 92, temperature 98.9 F (37.2 C), temperature source Oral, resp. rate 17, height 5\' 7"  (1.702 m), weight 73.3 kg, SpO2 100 %.   Intake/Output Summary (Last 24 hours) at 10/01/2017 1153 Last data filed at 09/30/2017 1300 Gross per 24 hour  Intake 360 ml  Output 300 ml  Net 60 ml    Exam  Awake Alert, Oriented x 3, No new F.N deficits, Normal affect .AT,PERRAL Supple Neck,No JVD, No cervical lymphadenopathy appriciated.  Symmetrical Chest wall movement, Good air movement bilaterally, CTAB RRR,No Gallops,Rubs or new Murmurs, No Parasternal Heave +ve B.Sounds, Abd Soft, Non tender, No organomegaly appriciated, No rebound -guarding or rigidity. No Cyanosis, Clubbing or edema, No new Rash or bruise   Data Review   CBC w Diff:  Lab Results  Component Value Date   WBC 5.3 10/01/2017   HGB 12.1 10/01/2017   HCT 39.8 10/01/2017   PLT 222 10/01/2017   LYMPHOPCT 7 08/23/2017   BANDSPCT 4 08/23/2017   MONOPCT 13 08/23/2017   EOSPCT 4 08/23/2017   BASOPCT 0 08/23/2017    CMP:  Lab Results  Component Value Date   NA 133 (L) 10/01/2017   K 5.3 (H) 10/01/2017   CL 103 10/01/2017   CO2 21 (L) 10/01/2017   BUN 16 10/01/2017   CREATININE 1.26 (H)  10/01/2017   PROT 7.5 09/29/2017   ALBUMIN 4.1 09/29/2017   BILITOT 0.8 09/29/2017   ALKPHOS 80 09/29/2017   AST 23 09/29/2017   ALT 12 09/29/2017  .   Total Time in preparing paper work, data evaluation and todays exam - 45 minutes  Lala Lund M.D on 10/01/2017 at 11:53 AM  [facility]   Office  [phone]

## 2017-10-01 NOTE — Discharge Instructions (Signed)
Follow with Primary MD Hoyt Koch, MD in 2-3 days and your gastroenterologist within a week, Get CBC, BMP  by Primary MD  in 2-3 days    Activity: As tolerated with Full fall precautions use walker/cane & assistance as needed  Disposition Home    Diet:  Heart Healthy    For Heart failure patients - Check your Weight same time everyday, if you gain over 2 pounds, or you develop in leg swelling, experience more shortness of breath or chest pain, call your Primary MD immediately. Follow Cardiac Low Salt Diet and 1.5 lit/day fluid restriction.  Special Instructions: If you have smoked or chewed Tobacco  in the last 2 yrs please stop smoking, stop any regular Alcohol  and or any Recreational drug use.  On your next visit with your primary care physician please Get Medicines reviewed and adjusted.  Please request your Prim.MD to go over all Hospital Tests and Procedure/Radiological results at the follow up, please get all Hospital records sent to your Prim MD by signing hospital release before you go home.  If you experience worsening of your admission symptoms, develop shortness of breath, life threatening emergency, suicidal or homicidal thoughts you must seek medical attention immediately by calling 911 or calling your MD immediately  if symptoms less severe.

## 2017-10-01 NOTE — Progress Notes (Signed)
PHARMACY - PHYSICIAN COMMUNICATION CRITICAL VALUE ALERT - BLOOD CULTURE IDENTIFICATION (BCID)  [name] is an 57 y.o. female who presented to [facility] on 09/29/2017   Assessment:  1/2 coag neg staph likely contaminant   Name of physician (or Provider) Contacted: Dr Ronnie Derby  Current antibiotics: None; bactrim prophylaxis   Changes to prescribed antibiotics recommended:  None - Pt being dc  Results for orders placed or performed during the hospital encounter of 09/29/17  Blood Culture ID Panel (Reflexed) (Collected: 09/30/2017 11:01 AM)  Result Value Ref Range   Enterococcus species NOT DETECTED NOT DETECTED   Listeria monocytogenes NOT DETECTED NOT DETECTED   Staphylococcus species DETECTED (A) NOT DETECTED   Staphylococcus aureus NOT DETECTED NOT DETECTED   Methicillin resistance NOT DETECTED NOT DETECTED   Streptococcus species NOT DETECTED NOT DETECTED   Streptococcus agalactiae NOT DETECTED NOT DETECTED   Streptococcus pneumoniae NOT DETECTED NOT DETECTED   Streptococcus pyogenes NOT DETECTED NOT DETECTED   Acinetobacter baumannii NOT DETECTED NOT DETECTED   Enterobacteriaceae species NOT DETECTED NOT DETECTED   Enterobacter cloacae complex NOT DETECTED NOT DETECTED   Escherichia coli NOT DETECTED NOT DETECTED   Klebsiella oxytoca NOT DETECTED NOT DETECTED   Klebsiella pneumoniae NOT DETECTED NOT DETECTED   Proteus species NOT DETECTED NOT DETECTED   Serratia marcescens NOT DETECTED NOT DETECTED   Haemophilus influenzae NOT DETECTED NOT DETECTED   Neisseria meningitidis NOT DETECTED NOT DETECTED   Pseudomonas aeruginosa NOT DETECTED NOT DETECTED   Candida albicans NOT DETECTED NOT DETECTED   Candida glabrata NOT DETECTED NOT DETECTED   Candida krusei NOT DETECTED NOT DETECTED   Candida parapsilosis NOT DETECTED NOT DETECTED   Candida tropicalis NOT DETECTED NOT DETECTED   Levester Fresh, PharmD, BCPS, BCCCP Clinical Pharmacist [phone]  Please check AMION for all  [place] numbers  10/01/2017 11:46 AM

## 2017-10-01 NOTE — Progress Notes (Signed)
Nsg Discharge Note  Admit Date:  09/29/2017 Discharge date: 10/01/2017   Vernia Buff to be D/C'd Home per MD order.  AVS completed.  Copy for chart, and copy for patient signed, and dated. Patient/caregiver able to verbalize understanding.  Discharge Medication: Allergies as of 10/01/2017      Reactions   Contrast Media [iodinated Diagnostic Agents] Other (See Comments)   Cant take due to kidney transplant   Nsaids Other (See Comments)   Cant take due to kidney transplant    Penicillins Hives, Other (See Comments)   PATIENT HAS HAD A PCN REACTION WITH IMMEDIATE RASH, FACIAL/TONGUE/THROAT SWELLING, SOB, OR LIGHTHEADEDNESS WITH HYPOTENSION:  #  #  YES  #  #  Has patient had a PCN reaction causing severe rash involving mucus membranes or skin necrosis: No Has patient had a PCN reaction that required hospitalization No Has patient had a PCN reaction occurring within the last 10 years: No If all of the above answers are "NO", then may proceed with Cephalosporin   Ace Inhibitors Hives, Nausea And Vomiting   Codeine Hives      Medication List    TAKE these medications   acetaminophen 500 MG tablet Commonly known as:  TYLENOL Take 1,000 mg by mouth every 6 (six) hours as needed for moderate pain or headache.   ALPRAZolam 0.25 MG tablet Commonly known as:  XANAX Take 1 tablet (0.25 mg total) by mouth daily as needed for anxiety.   docusate sodium 100 MG capsule Commonly known as:  COLACE Take 1 capsule (100 mg total) by mouth 2 (two) times daily for 15 days.   gabapentin 300 MG capsule Commonly known as:  NEURONTIN Take 600 mg by mouth 2 (two) times daily as needed (nerve pain).   mycophenolate 180 MG EC tablet Commonly known as:  MYFORTIC Take 720 mg by mouth 2 (two) times daily.   oxyCODONE 5 MG immediate release tablet Commonly known as:  Oxy IR/ROXICODONE Take 1-2 tablets (5-10 mg total) by mouth every 4 (four) hours as needed for moderate pain.   pantoprazole 40 MG  tablet Commonly known as:  PROTONIX Take 1 tablet (40 mg total) by mouth daily.   polyethylene glycol packet Commonly known as:  MIRALAX / GLYCOLAX Take 17 g by mouth daily.   predniSONE 2.5 MG tablet Commonly known as:  DELTASONE Take 7.5 mg by mouth every morning.   ranitidine 150 MG tablet Commonly known as:  ZANTAC Take 150 mg by mouth at bedtime.   sodium bicarbonate 650 MG tablet Take 650 mg by mouth daily.   sulfamethoxazole-trimethoprim 400-80 MG tablet Commonly known as:  BACTRIM,SEPTRA Take 1 tablet by mouth every Monday, Wednesday, and Friday.   tacrolimus 1 MG capsule Commonly known as:  PROGRAF Take 3-4 mg by mouth See admin instructions. Take four capsules by mouth in the morning and three capsules by mouth in the evening.       Discharge Assessment: Vitals:   09/30/17 2143 10/01/17 0632  BP: 131/69 (!) 150/75  Pulse: 94 92  Resp: 18 17  Temp: 98.6 F (37 C) 98.9 F (37.2 C)  SpO2: 100% 100%   Skin clean, dry and intact without evidence of skin break down, no evidence of skin tears noted. IV catheter discontinued intact. Site without signs and symptoms of complications - no redness or edema noted at insertion site, patient denies c/o pain - only slight tenderness at site.  Dressing with slight pressure applied.  D/c Instructions-Education: Discharge  instructions given to patient/family with verbalized understanding. D/c education completed with patient/family including follow up instructions, medication list, d/c activities limitations if indicated, with other d/c instructions as indicated by MD - patient able to verbalize understanding, all questions fully answered. Patient instructed to return to ED, call 911, or call MD for any changes in condition.  Patient escorted via [place], and D/C home via private auto.  Hiram Comber, RN 10/01/2017 12:57 PM

## 2017-10-02 ENCOUNTER — Telehealth: Payer: Self-pay | Admitting: Internal Medicine

## 2017-10-02 LAB — CULTURE, BLOOD (ROUTINE X 2): SPECIAL REQUESTS: ADEQUATE

## 2017-10-02 NOTE — Telephone Encounter (Signed)
Is there something needed with this?

## 2017-10-02 NOTE — Telephone Encounter (Signed)
Transition Care Management Follow-up Telephone Call  How have you been since you were released from the hospital? Patient stated that she feels better then in the hospital , has had BM since being home Brown in color no noticeable bleeding.   Do you understand why you were in the hospital? yes   Do you understand the discharge instrcutions? yes  Items Reviewed:  Medications reviewed: yes  Allergies reviewed: yes  Dietary changes reviewed: yes  Referrals reviewed: yes   Functional Questionnaire:   Activities of Daily Living (ADLs):   She states they are independent in the following: ambulation, bathing and hygiene, feeding, continence, grooming, toileting and dressing States they require assistance with the following: No assistance needed at this time.   Any transportation issues/concerns?: no   Any patient concerns? no   Confirmed importance and date/time of follow-up visits scheduled: yes   Confirmed with patient if condition begins to worsen call PCP or go to the ER.  Patient was given the Call-a-Nurse line [phone]: yes

## 2017-10-03 ENCOUNTER — Ambulatory Visit (INDEPENDENT_AMBULATORY_CARE_PROVIDER_SITE_OTHER): Payer: Medicare Other | Admitting: Internal Medicine

## 2017-10-03 ENCOUNTER — Encounter: Payer: Self-pay | Admitting: Internal Medicine

## 2017-10-03 VITALS — BP 100/60 | HR 92 | Temp 98.2°F | Ht 67.0 in | Wt 153.0 lb

## 2017-10-03 DIAGNOSIS — Z23 Encounter for immunization: Secondary | ICD-10-CM

## 2017-10-03 DIAGNOSIS — K5790 Diverticulosis of intestine, part unspecified, without perforation or abscess without bleeding: Secondary | ICD-10-CM

## 2017-10-03 DIAGNOSIS — D62 Acute posthemorrhagic anemia: Secondary | ICD-10-CM | POA: Diagnosis not present

## 2017-10-03 DIAGNOSIS — Z8719 Personal history of other diseases of the digestive system: Secondary | ICD-10-CM

## 2017-10-03 NOTE — Telephone Encounter (Signed)
No

## 2017-10-03 NOTE — Patient Instructions (Signed)
Claritin is good for the symptoms. Loratadine is the generic name for it.

## 2017-10-03 NOTE — Assessment & Plan Note (Signed)
She knows to watch for blood in stool. Regular soft BM currently.

## 2017-10-03 NOTE — Assessment & Plan Note (Signed)
Stable, regular BM and encouraged to avoid constipation. Has had 2 GI bleed recently and is following with GI as well.

## 2017-10-03 NOTE — Assessment & Plan Note (Signed)
Blood counts improved on discharge without transfusion and no recurrent bleeding. Will not repeat labs today. She knows to look for any bleeding and report any.

## 2017-10-03 NOTE — Progress Notes (Signed)
   Subjective:    Patient ID: [name], female    DOB: May 17, 1960, 57 y.o.   MRN: [id number]  HPI The patient is a 57 YO female coming in for hospital follow up (in for GI bleeding, lower GI source, no procedures done and no transfusion required, likely diverticular). She is feeling well since leaving the hospital. She did have rhinovirus positive with fever in the hospital. She denies chest pains or SOB. She denies blood in bowel movements since leaving hospital. She is getting labs with [facility] tomorrow. Still having some sinus congestion and taking flonase from the hospital. This is helping some. She denies fevers or chills. Took benadryl last night and this helped her sleep. Improving overall. Energy is okay. Appetite is still down a little from the sinus cold.   PMH, [facility], social history reviewed and updated.   Review of Systems  Constitutional: Positive for appetite change. Negative for activity change, fatigue, fever and unexpected weight change.  HENT: Positive for congestion and rhinorrhea. Negative for ear discharge, ear pain, postnasal drip, sinus pressure, sinus pain, sore throat and trouble swallowing.   Eyes: Negative.   Respiratory: Negative for cough, chest tightness and shortness of breath.   Cardiovascular: Negative for chest pain, palpitations and leg swelling.  Gastrointestinal: Negative for abdominal distention, abdominal pain, anal bleeding, blood in stool, constipation, diarrhea, nausea and vomiting.  Musculoskeletal: Negative.   Skin: Negative.   Neurological: Negative.   Psychiatric/Behavioral: Negative.       Objective:   Physical Exam  Constitutional: She is oriented to person, place, and time. She appears well-developed and well-nourished.  HENT:  Head: Normocephalic and atraumatic.  Mild oropharynx erythema with clear drainage, nose without crusting, TMs normal.   Eyes: EOM are normal.  Neck: Normal range of motion.  Cardiovascular: Normal rate and  regular rhythm.  Pulmonary/Chest: Effort normal and breath sounds normal. No respiratory distress. She has no wheezes. She has no rales.  Abdominal: Soft. Bowel sounds are normal. She exhibits no distension. There is no tenderness. There is no rebound.  Musculoskeletal: She exhibits no edema.  Neurological: She is alert and oriented to person, place, and time. Coordination normal.  Skin: Skin is warm and dry.  Psychiatric: She has a normal mood and affect.   Vitals:   10/03/17 0954  BP: 100/60  Pulse: 92  Temp: 98.2 F (36.8 C)  TempSrc: Oral  SpO2: 99%  Weight: 153 lb (69.4 kg)  Height: 5\' 7"  (1.702 m)      Assessment & Plan:  Flu shot given at visit

## 2017-10-04 DIAGNOSIS — B259 Cytomegaloviral disease, unspecified: Secondary | ICD-10-CM | POA: Diagnosis not present

## 2017-10-04 DIAGNOSIS — Z79899 Other long term (current) drug therapy: Secondary | ICD-10-CM | POA: Diagnosis not present

## 2017-10-04 DIAGNOSIS — Z940 Kidney transplant status: Secondary | ICD-10-CM | POA: Diagnosis not present

## 2017-10-04 DIAGNOSIS — J449 Chronic obstructive pulmonary disease, unspecified: Secondary | ICD-10-CM | POA: Diagnosis not present

## 2017-10-04 DIAGNOSIS — K922 Gastrointestinal hemorrhage, unspecified: Secondary | ICD-10-CM | POA: Diagnosis not present

## 2017-10-04 DIAGNOSIS — E872 Acidosis: Secondary | ICD-10-CM | POA: Diagnosis not present

## 2017-10-04 DIAGNOSIS — I10 Essential (primary) hypertension: Secondary | ICD-10-CM | POA: Diagnosis not present

## 2017-10-04 DIAGNOSIS — E869 Volume depletion, unspecified: Secondary | ICD-10-CM | POA: Diagnosis not present

## 2017-10-04 DIAGNOSIS — R600 Localized edema: Secondary | ICD-10-CM | POA: Diagnosis not present

## 2017-10-04 DIAGNOSIS — Z4822 Encounter for aftercare following kidney transplant: Secondary | ICD-10-CM | POA: Diagnosis not present

## 2017-10-04 DIAGNOSIS — Z87891 Personal history of nicotine dependence: Secondary | ICD-10-CM | POA: Diagnosis not present

## 2017-10-04 DIAGNOSIS — Z7983 Long term (current) use of bisphosphonates: Secondary | ICD-10-CM | POA: Diagnosis not present

## 2017-10-04 DIAGNOSIS — Z5181 Encounter for therapeutic drug level monitoring: Secondary | ICD-10-CM | POA: Diagnosis not present

## 2017-10-04 DIAGNOSIS — D649 Anemia, unspecified: Secondary | ICD-10-CM | POA: Diagnosis not present

## 2017-10-04 DIAGNOSIS — I471 Supraventricular tachycardia: Secondary | ICD-10-CM | POA: Diagnosis not present

## 2017-10-04 DIAGNOSIS — D8989 Other specified disorders involving the immune mechanism, not elsewhere classified: Secondary | ICD-10-CM | POA: Diagnosis not present

## 2017-10-04 DIAGNOSIS — Z792 Long term (current) use of antibiotics: Secondary | ICD-10-CM | POA: Diagnosis not present

## 2017-10-04 DIAGNOSIS — Z7952 Long term (current) use of systemic steroids: Secondary | ICD-10-CM | POA: Diagnosis not present

## 2017-10-05 LAB — CULTURE, BLOOD (ROUTINE X 2): Culture: NO GROWTH

## 2017-10-11 ENCOUNTER — Encounter: Payer: Self-pay | Admitting: Physical Therapy

## 2017-10-11 ENCOUNTER — Ambulatory Visit: Payer: Medicare Other | Attending: Internal Medicine | Admitting: Physical Therapy

## 2017-10-11 DIAGNOSIS — M6281 Muscle weakness (generalized): Secondary | ICD-10-CM | POA: Diagnosis not present

## 2017-10-11 DIAGNOSIS — M542 Cervicalgia: Secondary | ICD-10-CM | POA: Insufficient documentation

## 2017-10-11 DIAGNOSIS — R2681 Unsteadiness on feet: Secondary | ICD-10-CM

## 2017-10-11 DIAGNOSIS — M21371 Foot drop, right foot: Secondary | ICD-10-CM | POA: Diagnosis not present

## 2017-10-11 DIAGNOSIS — R2689 Other abnormalities of gait and mobility: Secondary | ICD-10-CM | POA: Diagnosis not present

## 2017-10-11 NOTE — Therapy (Signed)
[facility] [address] [place], [place], [address] Phone: [phone]   Fax:  [phone]  Physical Therapy Evaluation  Patient Details  Name: [name] MRN: [id number] Date of Birth: May 14, 1960 Referring Provider: Hoyt Koch, MD   Encounter Date: 10/11/2017  PT End of Session - 10/11/17 2135    Visit Number  1    Number of Visits  17    Date for PT Re-Evaluation  12/10/17    Authorization Type  Medicare - 10th visit PN    PT Start Time  6962    PT Stop Time  1615    PT Time Calculation (min)  45 min    Activity Tolerance  Patient tolerated treatment well    Behavior During Therapy  [facility] for tasks assessed/performed       Past Medical History:  Diagnosis Date  . Acute blood loss anemia   . Anemia   . Anxiety   . Arthritis   . Claustrophobia   . Diverticulosis   . ESRD (end stage renal disease) on dialysis ([place])    "TTS; [place]" (03/24/2015)  . GI bleed   . Glomerulonephritis   . Heart murmur   . HTN (hypertension)   . Hypothyroidism   . Kidney transplant recipient 01/21/2017  . Pancreatitis   . Renal insufficiency   . Secondary hyperparathyroidism ([place])    [name] 05/11/2014    Past Surgical History:  Procedure Laterality Date  . ANTERIOR CERVICAL CORPECTOMY N/A 05/24/2017   Procedure: CORPECTOMY CERVICAL FIVE- CERVICAL SIX;  Surgeon: Ashok Pall, MD;  Location: [place] [facility];  Service: Neurosurgery;  Laterality: N/A;  . ARTERIOVENOUS GRAFT PLACEMENT Right 1990's?  . ARTERIOVENOUS GRAFT PLACEMENT Left 07/2002   upper arm/notes 06/02/2010  . ARTERIOVENOUS GRAFT PLACEMENT Right 07/2003   upper arm/notes 06/02/2010  . AV FISTULA PLACEMENT Left 09/2000   upper arm/notes 06/02/2010  . BREAST BIOPSY Left unsure   benign  . COLONOSCOPY    . DILATATION & CURRETTAGE/HYSTEROSCOPY WITH RESECTOCOPE N/A 04/17/2013   Procedure: DILATATION & CURETTAGE/HYSTEROSCOPY WITH RESECTOCOPE;  Surgeon:  Marvene Staff, MD;  Location: [facility];  Service: Gynecology;  Laterality: N/A;  . DILATION AND CURETTAGE OF UTERUS    . EYE SURGERY    . KIDNEY TRANSPLANT  1997  . KIDNEY TRANSPLANT  01/21/2017   Dr. Harrington Challenger  . PARATHYROIDECTOMY  03/2000 05/12/2014   w/neck exploration & autotransplantation/notes 06/02/2010; w/neck exploration  . PARATHYROIDECTOMY N/A 05/12/2014   Procedure: PARATHYROIDECTOMY AND NECK EXPLORATION;  Surgeon: Armandina Gemma, MD;  Location: [place];  Service: General;  Laterality: N/A;  . PERITONEAL CATHETER INSERTION    . PERITONEAL CATHETER REMOVAL  08/1999   [name] 06/02/2010  . POSTERIOR CERVICAL FUSION/FORAMINOTOMY N/A 05/30/2017   Procedure: POSTERIOR CERVICAL Arthrodesis Cervical three - four, cervical four - five, cervical five - six, cervical six - seven;  Surgeon: Ashok Pall, MD;  Location: [place] [facility];  Service: Neurosurgery;  Laterality: N/A;  . POSTERIOR CERVICAL LAMINECTOMY N/A 08/12/2016   Procedure: POSTERIOR CERVICAL LAMINECTOMY CERVICAL THREE CERVICAL FOUR, CERVICAL FOUR CERVICAL FIVE, CERVICAL FIVE- CERVICAL SIX, CERVICAL SIX- CERVICAL SEVEN;  Surgeon: Ashok Pall, MD;  Location: [facility];  Service: Neurosurgery;  Laterality: N/A;  POSTERIOR  . RETINAL DETACHMENT SURGERY Left   . THROMBECTOMY Left 02/2002   fistula/notes 06/02/2010  . THROMBECTOMY / ARTERIOVENOUS GRAFT REVISION  11/2003; 01/2004; 08/07/2005; 08/10/2005; 10/2005   [name] 06/02/2010  . THROMBECTOMY AND REVISION OF ARTERIOVENTOUS (AV) GORETEX  GRAFT Right 01/2002; 11/2003; 01/2004; 08/07/2004; 08/10/2004; 11/13/2005   [name] 5/1/2012Marland Kitchen [name] 5/16/2012Marland Kitchen [name] 5/16/2012Marland Kitchen [name] 5/16/2012Marland Kitchen [name] 5/16/2012Marland Kitchen [name] 06/02/2010  . THROMBECTOMY AND REVISION OF ARTERIOVENTOUS (AV) GORETEX  GRAFT Left 08/23/2002; 09/13/2002; 07/08/2003   [name] 06/02/2010; [name] 06/02/2010; [name] 06/02/2010    There were no vitals filed for this visit.   Subjective Assessment - 10/11/17 1537    Subjective  Pt returns to therapy after having  lower GI bleed due to diverticulitis.  Pt is feeling better but still feels very weak in her core.  No pain in neck, just still feels tight.  Has seen Dr. Christella Noa since last therapy visit and everything "looks good".  Noticing more R foot drag since being hospitalized.      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  When she first stands up and starts walking, R foot drags    Patient Stated Goals  Focus on core strength, neck and balance    Currently in Pain?  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, anterior cervical corpectomy, posterior cervical laminectomy, parathyroidectomy, ESRD with kidney transplant in January of 2019, anxiety, and anemia      Balance Screen   Has the patient fallen in the past 6 months  No      [place] residence    Living Arrangements  Parent    Available Help at Discharge  Family    Type of [place] to enter    Entrance Stairs-Number of Steps  1  then [place]  One level    [place] - 2 wheels;Shower seat    Additional Comments  not currently using AD      Prior Function   Level of Independence  Independent    Vocation  On disability    Vocation Requirements  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  RLE: 3+/5 hip flexion, 4/5 knee extension and knee flexion, 3+/5 ankle DF      Transfers   Five time sit to stand comments   12.62 from chair without UE support      Ambulation/Gait   Ambulation/Gait  Yes    Ambulation/Gait Assistance  6: Modified independent (Device/Increase time)    Ambulation/Gait Assistance Details  continues to demonstrate R foot drop     Ambulation Distance (Feet)  500 Feet    Assistive device  None    Gait Pattern  Step-through pattern;Poor foot clearance - right    Ambulation Surface  Level;Indoor    Stairs  Yes    Stairs Assistance  6: Modified independent (Device/Increase time)    Stair Management Technique  No rails;Alternating pattern;Forwards    Number of Stairs  8    Height of Stairs  6      High Level Balance   High Level Balance Activities  Other (comment)   Single limb stance   High Level Balance Comments  4 seconds on LLE; 1 second on RLE with genu recurvatum      Standardized Balance Assessment   Standardized Balance Assessment  Five Times Sit to Stand;10 meter walk test    Five times sit to stand comments   12.62 from  chair without UE support    10 Meter Walk  9.25 or 3.54 ft/sec      Functional Gait  Assessment   Gait assessed   Yes    Gait Level Surface  Walks 20 ft in less than 7 sec but greater than 5.5 sec, uses assistive device, slower speed, mild gait deviations, or deviates 6-10 in outside of the 12 in walkway width.    Change in Gait Speed  Able to smoothly change walking speed without loss of balance or gait deviation. Deviate no more than 6 in outside of the 12 in walkway width.    Gait with Horizontal Head Turns  Performs head turns smoothly with slight change in gait velocity (eg, minor disruption to smooth gait path), deviates 6-10 in outside 12 in walkway width, or uses an assistive device.    Gait with Vertical Head Turns  Performs task with slight change in gait velocity (eg, minor disruption to smooth gait path), deviates 6 - 10 in outside 12 in walkway width or uses assistive device    Gait and Pivot Turn  Pivot turns safely within 3 sec and stops quickly with no loss of balance.    Step Over Obstacle  Is able to step over one shoe box (4.5 in total height) but must slow down and adjust steps to clear box safely. May require verbal cueing.    Gait with Narrow Base of Support  Ambulates less than 4 steps heel to toe or cannot perform without assistance.    Gait with Eyes Closed  Walks 20 ft, no assistive devices, good speed, no evidence of imbalance, normal gait pattern, deviates no more than 6 in outside 12 in walkway width. Ambulates 20 ft in less than 7 sec.    Ambulating Backwards  Walks 20 ft, uses assistive device, slower speed, mild gait deviations, deviates 6-10 in outside 12 in walkway width.    Steps  Alternating  feet, no rail.    Total Score  21    FGA comment:  21/30 medium falls risk                Objective measurements completed on examination: See above findings.              PT Education - 10/11/17 2134    Education Details  clinical findings, areas  where pt has maintained gains and areas where she has declined.  Initial core activation training    Person(s) Educated  Patient    Methods  Explanation;Demonstration    Comprehension  Verbalized understanding;Returned demonstration       PT Short Term Goals - 10/11/17 2142      PT SHORT TERM GOAL #1   Title  Pt will be independent with initial HEP in order to indicate improved functional mobility and decreased fall risk.     Time  4    Period  Weeks    Status  New    Target Date  11/10/17      PT SHORT TERM GOAL #2   Title  Pt will improve 5TSS to </=11 secs without UE support in order to indicate decreased fall risk and improved functional strength.      Baseline  12 seconds without use of UE    Time  4    Period  Weeks    Status  New    Target Date  11/10/17      PT SHORT TERM GOAL #3   Title  Pt will improve FGA to >/= 24/30 in order to indicate decreased fall risk.      Baseline  21/30    Time  4    Period  Weeks    Status  New    Target Date  11/10/17      PT SHORT TERM GOAL #4   Title  Pt will ambulate at gait speed of >/=3.6 ft/sec with decreased indication of R foot slap/overt gait deviations indicating safe gait speed.     Baseline  3.54 ft/sec     Time  4    Period  Weeks    Status  New    Target Date  11/10/17      PT SHORT TERM GOAL #5   Title  Pt will tolerate set up and use of Bioness functional electrical stimulation for DF assist on RLE    Time  4    Period  Weeks    Status  New    Target Date  11/10/17        PT Long Term Goals - 10/11/17 2145      PT LONG TERM GOAL #1   Title  Pt will be independent with final HEP in order to indicate improved functional mobility and decreased fall risk.     Time  8    Period  Weeks    Status  New    Target Date  12/10/17      PT LONG TERM GOAL #2   Title  Pt will improve FGA to >/=27/30 in order to indicate decreased fall risk.      Time  8    Period  Weeks    Status  New    Target Date  12/10/17       PT LONG TERM GOAL #3   Title  Pt will improve gait velocity to >/= 3.8 ft/sec with decreased evidence of R foot drop and genu recurvatum to  decrease falls risk in community    Time  8    Period  Weeks    Status  New    Target Date  12/10/17      PT LONG TERM GOAL #4   Title  Pt will ambulate >1000' over varying outdoor surfaces with decreased incidence of R foot slap/drag and increased foot clearance to indicate safer ambulation in community    Time  8    Period  Weeks    Status  New    Target Date  12/10/17      PT LONG TERM GOAL #5   Title  Pt will improve cervical rotation by 10 deg bilaterally in order to indicate improved safety with driving      Baseline  40 deg bilaterally    Time  8    Period  Weeks    Status  New    Target Date  12/10/17             Plan - 10/11/17 2136    Clinical Impression Statement  Pt is a 57 year old female referred back to Neuro OPPT following multiple hospitalizations for lower GI bleed for evaluation of deconditioning and neck pain.  Pt was previously receiving physical therapy at this facility following cervical spine surgery and was making excellent progress until she experienced GI bleed in August.  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  The following deficits were noted during pt's exam: ongoing impairments in cervical spine ROM, impaired core and LE strength, impaired balance and gait.  Pt's FGA has declined and indicates pt is at medium risk for falls but has maintained gains from previous therapy certification as indicated by five time sit to stand score and gait velocity. Pt would benefit from skilled PT to address these  impairments and functional limitations to maximize functional mobility independence and reduce falls risk.    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, anterior cervical corpectomy, posterior cervical laminectomy, parathyroidectomy, ESRD with kidney transplant in January of 2019, anxiety, and anemia    Clinical Presentation  Evolving    Clinical Presentation due to:  significant PMH, deconditioning after GI bleed with decline in function, impaired ROM, strength, balance and gait    Clinical Decision Making  Moderate    Rehab Potential  Good    Clinical Impairments Affecting Rehab Potential  severity of cord compression     PT Frequency  2x / week    PT Duration  8 weeks    PT Treatment/Interventions  ADLs/Self Care Home Management;Electrical Stimulation;Gait training;Stair training;Functional mobility training;Therapeutic activities;Therapeutic exercise;Balance training;Neuromuscular re-education;Patient/family education;Orthotic Fit/Training;Passive range of motion;Manual techniques;Aquatic Therapy;Moist Heat;Taping    PT Next Visit Plan  MD SAID NO TO DRY NEEDLING; Bioness for RLE.  Core strengthening and RLE strengthening.  Continue to address cervical tightness: gentle neck ROM/ isometrics, high level balance and dynamic gait on compliant surfaces    Consulted and Agree with Plan of Care  Patient       Patient will benefit from skilled therapeutic intervention in order to improve the following deficits and impairments:  Abnormal gait, Decreased balance,  Decreased range of motion, Decreased strength, Hypomobility, Impaired flexibility, Postural dysfunction, Difficulty walking, Impaired sensation  Visit Diagnosis: Cervicalgia  Muscle weakness (generalized)  Other abnormalities of gait and  mobility  Foot drop, right  Unsteadiness on feet     Problem List Patient Active Problem List   Diagnosis Date Noted  . History of GI diverticular bleed 09/11/2017  . Lower GI bleed   . Acute blood loss anemia   . Diverticulosis   . 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 Kidney transplant recipient 01/21/2017  . Anxiety 11/21/2016  . Stenosis of cervical spine with myelopathy ([place]) 08/12/2016  . Routine general medical examination at a health care facility 06/01/2016  . Hypothyroidism 03/24/2015  . History of tachycardia 03/24/2015  . HTN (hypertension), benign 06/12/2013  . ESRD on dialysis [facility]) 06/12/2013    Rico Junker, PT, DPT 10/11/17    9:51 PM    [facility] [address] [place], [place], [address] Phone: [phone]   Fax:  [phone]  Name: RIKITA [name] MRN: [id number] Date of Birth: October 22, 1960

## 2017-10-12 ENCOUNTER — Ambulatory Visit: Payer: Medicare Other | Admitting: Rehabilitation

## 2017-10-12 ENCOUNTER — Encounter: Payer: Self-pay | Admitting: Rehabilitation

## 2017-10-12 DIAGNOSIS — R2681 Unsteadiness on feet: Secondary | ICD-10-CM | POA: Diagnosis not present

## 2017-10-12 DIAGNOSIS — M6281 Muscle weakness (generalized): Secondary | ICD-10-CM

## 2017-10-12 DIAGNOSIS — R2689 Other abnormalities of gait and mobility: Secondary | ICD-10-CM

## 2017-10-12 DIAGNOSIS — M21371 Foot drop, right foot: Secondary | ICD-10-CM | POA: Diagnosis not present

## 2017-10-12 DIAGNOSIS — M542 Cervicalgia: Secondary | ICD-10-CM | POA: Diagnosis not present

## 2017-10-12 NOTE — Patient Instructions (Addendum)
Thoracic Self-Mobilization (Sitting)    With small rolled towel atupperribs level, gently lean back until stretch is felt. Hold __10-15__ seconds. Relax. Repeat _5___ times per set. Do __2__ sets per session. Do _1-2__ sessions per day.  http://orth.exer.us/999  Copyright  VHI. All rights reserved.  Pectoralis Stretch With Scapula Adduction: Supine (Towel Roll)    Lie with rolled towelor small pool noodleunder spine vertically.Place arms in "T" position with palms facing ceiling. Hold here for 2 mins. Do 2-3 times per day. If you are able to right arm higher, you can.     PELVIC STABILIZATION: Single Foot Touch    Lie on pool noodle.  With knees bent over hips and calves parallel to floor, inhale and place toes of right foot on floor. Keep low back pressed into pool by tightening abdominal muscles. Exhaling, bring foot back up. Repeat with other leg. Repeat _10__ times, alternating legs. Do _2__ times per day.  Copyright  VHI. All rights reserved.   Quadruped: Hip Extension / Knee Extension (Active)    On hands and knees, lift right leg with knee straight. Make sure that when you lift your leg, back stays straight and doesn't slouch.   Complete _1__ sets of _10__ repetitions. Perform _1-2__ sessions per day.  Copyright  VHI. All rights reserved.     MODIFIED PLANK PLUS  Perform a plank on your knees and elbows as shown and sustain the hold. While lying on stomach, lift hips/thighs off of floor, keeping knees on floor.  Don't let shoulder blades get closer, keep them apart.  Hold for 5 secs.  Do 5 reps then rest, do 5 more.           ANKLE: Dorsiflexion (Band)    Sit at edge of surface. Place band around top of foot. Keeping heel on floor, raise toes of banded foot. Hold _2-3__ seconds. Use __yellow______ band. _10__ reps per set, __1-2_ sets per day, _5-7__ days per week    Toe / Heel Raise    Gently rock back on heels and raise  toes. Then rock forward on toes and raise heels. Repeat sequence __10__ times per session. Do __5__ sessions per week.  Feet Partial Heel-Toe, Arm Motion - Eyes Open    With eyes open, right foot partially in front of the other, keep arms by your side and hold balance.  Then switch feet.  Repeat ___3_ times per session for 15 secs each. Do __1-2__ sessions per day.  Copyright  VHI. All rights reserved.

## 2017-10-12 NOTE — Therapy (Signed)
[facility] [address] [place], [place], [address] Phone: [phone]   Fax:  [phone]  Physical Therapy Treatment  Patient Details  Name: [name] MRN: [id number] Date of Birth: December 04, 1960 Referring Provider (PT): Hoyt Koch, MD   Encounter Date: 10/12/2017  PT End of Session - 10/12/17 1518    Visit Number  2    Number of Visits  17    Date for PT Re-Evaluation  12/10/17    Authorization Type  Medicare - 10th visit PN    PT Start Time  1318    PT Stop Time  1400    PT Time Calculation (min)  42 min    Activity Tolerance  Patient tolerated treatment well    Behavior During Therapy  [facility] for tasks assessed/performed       Past Medical History:  Diagnosis Date  . Acute blood loss anemia   . Anemia   . Anxiety   . Arthritis   . Claustrophobia   . Diverticulosis   . ESRD (end stage renal disease) on dialysis ([place])    "TTS; [place]" (03/24/2015)  . GI bleed   . Glomerulonephritis   . Heart murmur   . HTN (hypertension)   . Hypothyroidism   . Kidney transplant recipient 01/21/2017  . Pancreatitis   . Renal insufficiency   . Secondary hyperparathyroidism ([place])    [name] 05/11/2014    Past Surgical History:  Procedure Laterality Date  . ANTERIOR CERVICAL CORPECTOMY N/A 05/24/2017   Procedure: CORPECTOMY CERVICAL FIVE- CERVICAL SIX;  Surgeon: Ashok Pall, MD;  Location: [facility];  Service: Neurosurgery;  Laterality: N/A;  . ARTERIOVENOUS GRAFT PLACEMENT Right 1990's?  . ARTERIOVENOUS GRAFT PLACEMENT Left 07/2002   upper arm/notes 06/02/2010  . ARTERIOVENOUS GRAFT PLACEMENT Right 07/2003   upper arm/notes 06/02/2010  . AV FISTULA PLACEMENT Left 09/2000   upper arm/notes 06/02/2010  . BREAST BIOPSY Left unsure   benign  . COLONOSCOPY    . DILATATION & CURRETTAGE/HYSTEROSCOPY WITH RESECTOCOPE N/A 04/17/2013   Procedure: DILATATION & CURETTAGE/HYSTEROSCOPY WITH RESECTOCOPE;  Surgeon:  Marvene Staff, MD;  Location: [facility];  Service: Gynecology;  Laterality: N/A;  . DILATION AND CURETTAGE OF UTERUS    . EYE SURGERY    . KIDNEY TRANSPLANT  1997  . KIDNEY TRANSPLANT  01/21/2017   Dr. Harrington Challenger  . PARATHYROIDECTOMY  03/2000 05/12/2014   w/neck exploration & autotransplantation/notes 06/02/2010; w/neck exploration  . PARATHYROIDECTOMY N/A 05/12/2014   Procedure: PARATHYROIDECTOMY AND NECK EXPLORATION;  Surgeon: Armandina Gemma, MD;  Location: [facility];  Service: General;  Laterality: N/A;  . PERITONEAL CATHETER INSERTION    . PERITONEAL CATHETER REMOVAL  08/1999   [name] 06/02/2010  . POSTERIOR CERVICAL FUSION/FORAMINOTOMY N/A 05/30/2017   Procedure: POSTERIOR CERVICAL Arthrodesis Cervical three - four, cervical four - five, cervical five - six, cervical six - seven;  Surgeon: Ashok Pall, MD;  Location: [facility];  Service: Neurosurgery;  Laterality: N/A;  . POSTERIOR CERVICAL LAMINECTOMY N/A 08/12/2016   Procedure: POSTERIOR CERVICAL LAMINECTOMY CERVICAL THREE CERVICAL FOUR, CERVICAL FOUR CERVICAL FIVE, CERVICAL FIVE- CERVICAL SIX, CERVICAL SIX- CERVICAL SEVEN;  Surgeon: Ashok Pall, MD;  Location: [facility];  Service: Neurosurgery;  Laterality: N/A;  POSTERIOR  . RETINAL DETACHMENT SURGERY Left   . THROMBECTOMY Left 02/2002   fistula/notes 06/02/2010  . THROMBECTOMY / ARTERIOVENOUS GRAFT REVISION  11/2003; 01/2004; 08/07/2005; 08/10/2005; 10/2005   [name] 06/02/2010  . THROMBECTOMY AND REVISION OF ARTERIOVENTOUS (AV)  GORETEX  GRAFT Right 01/2002; 11/2003; 01/2004; 08/07/2004; 08/10/2004; 11/13/2005   [name] 5/1/2012Marland Kitchen [name] 5/16/2012Marland Kitchen [name] 5/16/2012Marland Kitchen [name] 5/16/2012Marland Kitchen [name] 06/02/2010; [name] 06/02/2010  . THROMBECTOMY AND REVISION OF ARTERIOVENTOUS (AV) GORETEX  GRAFT Left 08/23/2002; 09/13/2002; 07/08/2003   [name] 06/02/2010; [name] 06/02/2010; [name] 06/02/2010    There were no vitals filed for this visit.  Subjective Assessment - 10/12/17 1318    Subjective  No changes since yesterday.      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  When she first stands up and starts walking, R foot drags    Patient Stated Goals  Focus on core strength, neck and balance    Currently in Pain?  No/denies           Thoracic Self-Mobilization (Sitting)    With small rolled towel atupperribs level, gently lean back until stretch is felt. Hold __10-15__ seconds. Relax. Repeat _5___ times per set. Do __2__ sets per session. Do _1-2__ sessions per day.  http://orth.exer.us/999  Copyright  VHI. All rights reserved.  Pectoralis Stretch With Scapula Adduction: Supine (Towel Roll)    Lie with rolled towelor small pool noodleunder spine vertically.Place arms in "T" position with palms facing ceiling. Hold here for 2 mins. Do 2-3 times per day. If you are able to right arm higher, you can.     PELVIC STABILIZATION: Single Foot Touch    Lie on pool noodle.  With knees bent over hips and calves parallel to floor, inhale and place toes of right foot on floor. Keep low back pressed into pool by tightening abdominal muscles. Exhaling, bring foot back up. Repeat with other leg. Repeat _10__ times, alternating legs. Do _2__ times per day.  Copyright  VHI. All rights reserved.   Quadruped: Hip Extension / Knee Extension (Active)    On hands and knees, lift right leg with knee straight. Make sure that when you lift your leg, back stays straight and doesn't slouch.   Complete _1__ sets of _10__ repetitions. Perform _1-2__ sessions per day.  Copyright  VHI. All rights reserved.     MODIFIED PLANK  PLUS  Perform a plank on your knees and elbows as shown and sustain the hold. While lying on stomach, lift hips/thighs off of floor, keeping knees on floor.  Don't let shoulder blades get closer, keep them apart.  Hold for 5 secs.  Do 5 reps then rest, do 5 more.           ANKLE: Dorsiflexion (Band)    Sit at edge of surface. Place band around top of foot. Keeping heel on floor, raise toes of banded foot. Hold _2-3__ seconds. Use __yellow______ band. _10__ reps per set, __1-2_ sets per day, _5-7__ days per week    Toe / Heel Raise    Gently rock back on heels and raise toes. Then rock forward on toes and raise heels. Repeat sequence __10__ times per session. Do __5__ sessions per week.  Feet Partial Heel-Toe, Arm Motion - Eyes Open    With eyes open, right foot partially in front of the other, keep arms by your side and hold balance.  Then switch feet.  Repeat ___3_ times per session for 15 secs each.  Do __1-2__ sessions per day.  Copyright  VHI. All rights reserved.   Did not perform ankle DF with theraband during session.   TE:  Also performed tall kneeling with hands on large physioball rolling ball forward with forward weight shift and return to midline for core strengthening.  Pt with increased difficulty when PT facilitated increased forward weight shift.  Performed x 5 reps                      PT Education - 10/12/17 1518    Education Details  updates/modifications to HEP    Person(s) Educated  Patient    Methods  Explanation;Demonstration;Handout    Comprehension  Verbalized understanding;Returned demonstration       PT Short Term Goals - 10/11/17 2142      PT SHORT TERM GOAL #1   Title  Pt will be independent with initial HEP in order to indicate improved functional mobility and decreased fall risk.     Time  4    Period  Weeks    Status  New    Target Date  11/10/17      PT SHORT TERM GOAL #2   Title  Pt will improve 5TSS  to </=11 secs without UE support in order to indicate decreased fall risk and improved functional strength.      Baseline  12 seconds without use of UE    Time  4    Period  Weeks    Status  New    Target Date  11/10/17      PT SHORT TERM GOAL #3   Title  Pt will improve FGA to >/= 24/30 in order to indicate decreased fall risk.      Baseline  21/30    Time  4    Period  Weeks    Status  New    Target Date  11/10/17      PT SHORT TERM GOAL #4   Title  Pt will ambulate at gait speed of >/=3.6 ft/sec with decreased indication of R foot slap/overt gait deviations indicating safe gait speed.     Baseline  3.54 ft/sec     Time  4    Period  Weeks    Status  New    Target Date  11/10/17      PT SHORT TERM GOAL #5   Title  Pt will tolerate set up and use of Bioness functional electrical stimulation for DF assist on RLE    Time  4    Period  Weeks    Status  New    Target Date  11/10/17        PT Long Term Goals - 10/11/17 2145      PT LONG TERM GOAL #1   Title  Pt will be independent with final HEP in order to indicate improved functional mobility and decreased fall risk.     Time  8    Period  Weeks    Status  New    Target Date  12/10/17      PT LONG TERM GOAL #2   Title  Pt will improve FGA to >/=27/30 in order to indicate decreased fall risk.      Time  8    Period  Weeks    Status  New    Target Date  12/10/17      PT LONG TERM GOAL #3   Title  Pt will improve gait velocity  to >/= 3.8 ft/sec with decreased evidence of R foot drop and genu recurvatum to decrease falls risk in community    Time  8    Period  Weeks    Status  New    Target Date  12/10/17      PT LONG TERM GOAL #4   Title  Pt will ambulate >1000' over varying outdoor surfaces with decreased incidence of R foot slap/drag and increased foot clearance to indicate safer ambulation in community    Time  8    Period  Weeks    Status  New    Target Date  12/10/17      PT LONG TERM GOAL #5   Title   Pt will improve cervical rotation by 10 deg bilaterally in order to indicate improved safety with driving      Baseline  40 deg bilaterally    Time  8    Period  Weeks    Status  New    Target Date  12/10/17            Plan - 10/12/17 1518    Clinical Impression Statement  Skilled session focused on review of previous HEP given at last session of last episode of care which address strength, flexibility and balance.  PT updated/modified as needed and added exercises to address core weakness.  Pt tolerated well, see pt instruction for details.     Rehab Potential  Good    Clinical Impairments Affecting Rehab Potential  severity of cord compression     PT Frequency  2x / week    PT Duration  8 weeks    PT Treatment/Interventions  ADLs/Self Care Home Management;Electrical Stimulation;Gait training;Stair training;Functional mobility training;Therapeutic activities;Therapeutic exercise;Balance training;Neuromuscular re-education;Patient/family education;Orthotic Fit/Training;Passive range of motion;Manual techniques;Aquatic Therapy;Moist Heat;Taping    PT Next Visit Plan  See if HEP going okay-too much, any neck pain?MD SAID NO TO DRY NEEDLING; Bioness for RLE.  Core strengthening and RLE strengthening.  Continue to address cervical tightness: gentle neck ROM/ isometrics, high level balance and dynamic gait on compliant surfaces    Consulted and Agree with Plan of Care  Patient       Patient will benefit from skilled therapeutic intervention in order to improve the following deficits and impairments:  Abnormal gait, Decreased balance, Decreased range of motion, Decreased strength, Hypomobility, Impaired flexibility, Postural dysfunction, Difficulty walking, Impaired sensation  Visit Diagnosis: Muscle weakness (generalized)  Other abnormalities of gait and mobility  Unsteadiness on feet     Problem List Patient Active Problem List   Diagnosis Date Noted  . History of GI diverticular  bleed 09/11/2017  . Lower GI bleed   . Acute blood loss anemia   . Diverticulosis   . 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 Kidney transplant recipient 01/21/2017  . Anxiety 11/21/2016  . Stenosis of cervical spine with myelopathy ([place]) 08/12/2016  . Routine general medical examination at a health care facility 06/01/2016  . Hypothyroidism 03/24/2015  . History of tachycardia 03/24/2015  . HTN (hypertension), benign 06/12/2013  . ESRD on dialysis [facility]) 06/12/2013    Cameron Sprang, PT, MPT [facility] [address] [place], [place], [address] Phone: [phone]   Fax:  [phone] 10/12/17, 3:20 PM  Name: [name] MRN: [id number] Date of Birth: 05/24/1960

## 2017-10-16 ENCOUNTER — Encounter: Payer: Self-pay | Admitting: Physical Therapy

## 2017-10-16 ENCOUNTER — Ambulatory Visit: Payer: Medicare Other | Admitting: Physical Therapy

## 2017-10-16 DIAGNOSIS — M6281 Muscle weakness (generalized): Secondary | ICD-10-CM | POA: Diagnosis not present

## 2017-10-16 DIAGNOSIS — M542 Cervicalgia: Secondary | ICD-10-CM | POA: Diagnosis not present

## 2017-10-16 DIAGNOSIS — R2681 Unsteadiness on feet: Secondary | ICD-10-CM

## 2017-10-16 DIAGNOSIS — M21371 Foot drop, right foot: Secondary | ICD-10-CM | POA: Diagnosis not present

## 2017-10-16 DIAGNOSIS — R2689 Other abnormalities of gait and mobility: Secondary | ICD-10-CM | POA: Diagnosis not present

## 2017-10-16 NOTE — Patient Instructions (Addendum)
Upper Limb Neural Tension: Radial II    Sit on hand with palm up to stabilize shoulder and bend head away (bringing ear toward shoulder), keeping shoulder girdle down. Hold __30__ seconds. Repeat _3_ times each side. Do _1_ sets per session. Do _1-2_ sessions per day http://orth.exer.us/410   Copyright  VHI. All rights reserved.      Sit on hand palm up, then look toward opposite side while tucking head down (looking toward armpit). Hold for 30 sec's. Repeat for 3 reps each side. Perform 1-2 times a day.

## 2017-10-16 NOTE — Therapy (Signed)
[facility] [address] [place], [place], [address] Phone: [phone]   Fax:  [phone]  Physical Therapy Treatment  Patient Details  Name: [name] MRN: [id number] Date of Birth: 06/05/1960 Referring Provider (PT): Hoyt Koch, MD   Encounter Date: 10/16/2017  PT End of Session - 10/16/17 1155    Visit Number  3    Number of Visits  17    Date for PT Re-Evaluation  12/10/17    Authorization Type  Medicare - 10th visit PN    PT Start Time  1149    PT Stop Time  1230    PT Time Calculation (min)  41 min    Equipment Utilized During Treatment  Gait belt    Activity Tolerance  Patient tolerated treatment well;No increased pain    Behavior During Therapy  WFL for tasks assessed/performed       Past Medical History:  Diagnosis Date  . Acute blood loss anemia   . Anemia   . Anxiety   . Arthritis   . Claustrophobia   . Diverticulosis   . ESRD (end stage renal disease) on dialysis ([place])    "TTS; [facility]" (03/24/2015)  . GI bleed   . Glomerulonephritis   . Heart murmur   . HTN (hypertension)   . Hypothyroidism   . Kidney transplant recipient 01/21/2017  . Pancreatitis   . Renal insufficiency   . Secondary hyperparathyroidism ([place])    [name] 05/11/2014    Past Surgical History:  Procedure Laterality Date  . ANTERIOR CERVICAL CORPECTOMY N/A 05/24/2017   Procedure: CORPECTOMY CERVICAL FIVE- CERVICAL SIX;  Surgeon: Ashok Pall, MD;  Location: [facility];  Service: Neurosurgery;  Laterality: N/A;  . ARTERIOVENOUS GRAFT PLACEMENT Right 1990's?  . ARTERIOVENOUS GRAFT PLACEMENT Left 07/2002   upper arm/notes 06/02/2010  . ARTERIOVENOUS GRAFT PLACEMENT Right 07/2003   upper arm/notes 06/02/2010  . AV FISTULA PLACEMENT Left 09/2000   upper arm/notes 06/02/2010  . BREAST BIOPSY Left unsure   benign  . COLONOSCOPY    . DILATATION & CURRETTAGE/HYSTEROSCOPY WITH RESECTOCOPE N/A 04/17/2013   Procedure: DILATATION & CURETTAGE/HYSTEROSCOPY WITH RESECTOCOPE;  Surgeon: Marvene Staff, MD;  Location: [facility];  Service: Gynecology;  Laterality: N/A;  . DILATION AND CURETTAGE OF UTERUS    . EYE SURGERY    . KIDNEY TRANSPLANT  1997  . KIDNEY TRANSPLANT  01/21/2017   Dr. Harrington Challenger  . PARATHYROIDECTOMY  03/2000 05/12/2014   w/neck exploration & autotransplantation/notes 06/02/2010; w/neck exploration  . PARATHYROIDECTOMY N/A 05/12/2014   Procedure: PARATHYROIDECTOMY AND NECK EXPLORATION;  Surgeon: Armandina Gemma, MD;  Location: [facility];  Service: General;  Laterality: N/A;  . PERITONEAL CATHETER INSERTION    . PERITONEAL CATHETER REMOVAL  08/1999   [name] 06/02/2010  . POSTERIOR CERVICAL FUSION/FORAMINOTOMY N/A 05/30/2017   Procedure: POSTERIOR CERVICAL Arthrodesis Cervical three - four, cervical four - five, cervical five - six, cervical six - seven;  Surgeon: Ashok Pall, MD;  Location: [facility];  Service: Neurosurgery;  Laterality: N/A;  . POSTERIOR CERVICAL LAMINECTOMY N/A 08/12/2016   Procedure: POSTERIOR CERVICAL LAMINECTOMY CERVICAL THREE CERVICAL FOUR, CERVICAL FOUR CERVICAL FIVE, CERVICAL FIVE- CERVICAL SIX, CERVICAL SIX- CERVICAL SEVEN;  Surgeon: Ashok Pall, MD;  Location: [facility];  Service: Neurosurgery;  Laterality: N/A;  POSTERIOR  . RETINAL DETACHMENT SURGERY Left   . THROMBECTOMY Left 02/2002   fistula/notes 06/02/2010  . THROMBECTOMY / ARTERIOVENOUS GRAFT REVISION  11/2003; 01/2004; 08/07/2005; 08/10/2005; 10/2005   /  notes 06/02/2010  . THROMBECTOMY AND REVISION OF ARTERIOVENTOUS (AV) GORETEX  GRAFT Right 01/2002; 11/2003; 01/2004; 08/07/2004; 08/10/2004; 11/13/2005   [name] 5/1/2012Marland Kitchen [name] 5/16/2012Marland Kitchen [name] 5/16/2012Marland Kitchen [name] 5/16/2012Marland Kitchen [name] 06/02/2010; [name] 06/02/2010  . THROMBECTOMY AND REVISION OF ARTERIOVENTOUS (AV) GORETEX  GRAFT Left 08/23/2002; 09/13/2002; 07/08/2003   [name] 06/02/2010; [name] 06/02/2010; [name] 06/02/2010    There were no vitals filed for this visit.  Subjective  Assessment - 10/16/17 1154    Subjective  Had a busy weekend at a Church event on Saturday, rested on Sunday. No pain or falls. Continues with tightness in neck.     Limitations  House hold activities;Walking    How long can you walk comfortably?  When she first stands up and starts walking, R foot drags    Patient Stated Goals  Focus on core strength, neck and balance    Currently in Pain?  No/denies    Pain Score  0-No pain          [facility] Adult PT Treatment/Exercise - 10/16/17 1156      Manual Therapy   Manual Therapy  Soft tissue mobilization;Manual Traction;Neural Stretch;Muscle Energy Technique    Manual therapy comments  All manual therapy performed to decreased tightness and improve cervical ROM.      Soft tissue mobilization  to bil upper traps, scalenes, levators and cervical paraspinals.   Manual Traction  suboccipital release for 30 sec hold x 4 reps; gentle cervical distraction progressing to gentle cervical distaction concurrent with scapular depression x 10 reps, then concurrent with scapular retraction for 10 reps.     Muscle Energy Technique  see above comments    Neural Stretch  concurrent with gentle manual distraction for overpressure to shoulder for increased stretching, added cervical rotation with chin tuck toward opposite side. 30 sec holds for 3 reps each side with each stretch.       Neck Exercises: Stretches   Upper Trapezius Stretch  Right;Left;2 reps;30 seconds;Limitations    Upper Trapezius Stretch Limitations  cues for technique and hold times    Levator Stretch  Right;Left;2 reps;30 seconds;Limitations    Levator Stretch Limitations  cues for technique and hold times          Balance Exercises - 10/16/17 1213      Balance Exercises: Standing   Standing Eyes Closed  Narrow base of support (BOS);Wide (BOA);Head turns;Foam/compliant surface;Other reps (comment);30 secs;Limitations      Balance Exercises: Standing   Standing Eyes Closed Limitations   on doubled over dense blue mat: wide base of support progressing to narrow base of support with EC no head movements, progressing back to wide base of support for EC head movements left<>right, then up<>down. min guard to min assist with occasional touch to walls/chair for balance.         PT Education - 10/16/17 1231    Education Details  added stretches to HEP    Person(s) Educated  Patient    Methods  Explanation;Demonstration;Verbal cues;Handout    Comprehension  Verbalized understanding;Returned demonstration;Verbal cues required;Need further instruction       PT Short Term Goals - 10/11/17 2142      PT SHORT TERM GOAL #1   Title  Pt will be independent with initial HEP in order to indicate improved functional mobility and decreased fall risk.     Time  4    Period  Weeks    Status  New    Target Date  11/10/17  PT SHORT TERM GOAL #2   Title  Pt will improve 5TSS to </=11 secs without UE support in order to indicate decreased fall risk and improved functional strength.      Baseline  12 seconds without use of UE    Time  4    Period  Weeks    Status  New    Target Date  11/10/17      PT SHORT TERM GOAL #3   Title  Pt will improve FGA to >/= 24/30 in order to indicate decreased fall risk.      Baseline  21/30    Time  4    Period  Weeks    Status  New    Target Date  11/10/17      PT SHORT TERM GOAL #4   Title  Pt will ambulate at gait speed of >/=3.6 ft/sec with decreased indication of R foot slap/overt gait deviations indicating safe gait speed.     Baseline  3.54 ft/sec     Time  4    Period  Weeks    Status  New    Target Date  11/10/17      PT SHORT TERM GOAL #5   Title  Pt will tolerate set up and use of Bioness functional electrical stimulation for DF assist on RLE    Time  4    Period  Weeks    Status  New    Target Date  11/10/17        PT Long Term Goals - 10/11/17 2145      PT LONG TERM GOAL #1   Title  Pt will be independent with final  HEP in order to indicate improved functional mobility and decreased fall risk.     Time  8    Period  Weeks    Status  New    Target Date  12/10/17      PT LONG TERM GOAL #2   Title  Pt will improve FGA to >/=27/30 in order to indicate decreased fall risk.      Time  8    Period  Weeks    Status  New    Target Date  12/10/17      PT LONG TERM GOAL #3   Title  Pt will improve gait velocity to >/= 3.8 ft/sec with decreased evidence of R foot drop and genu recurvatum to decrease falls risk in community    Time  8    Period  Weeks    Status  New    Target Date  12/10/17      PT LONG TERM GOAL #4   Title  Pt will ambulate >1000' over varying outdoor surfaces with decreased incidence of R foot slap/drag and increased foot clearance to indicate safer ambulation in community    Time  8    Period  Weeks    Status  New    Target Date  12/10/17      PT LONG TERM GOAL #5   Title  Pt will improve cervical rotation by 10 deg bilaterally in order to indicate improved safety with driving      Baseline  40 deg bilaterally    Time  8    Period  Weeks    Status  New    Target Date  12/10/17            Plan - 10/16/17 1155    Clinical Impression Statement  'Today's skilled  session continued to address cervical tightness and balance reactions with vision removed. Added 2 stretches to HEP with no issues reported with performance in session today. The pt is progressing toward goals and should benefit from continued PT to progress toward unmet goals.     Rehab Potential  Good    Clinical Impairments Affecting Rehab Potential  severity of cord compression     PT Frequency  2x / week    PT Duration  8 weeks    PT Treatment/Interventions  ADLs/Self Care Home Management;Electrical Stimulation;Gait training;Stair training;Functional mobility training;Therapeutic activities;Therapeutic exercise;Balance training;Neuromuscular re-education;Patient/family education;Orthotic Fit/Training;Passive range  of motion;Manual techniques;Aquatic Therapy;Moist Heat;Taping    PT Next Visit Plan  MD SAID NO TO DRY NEEDLING; Bioness for RLE.  Core strengthening and RLE strengthening.  Continue to address cervical tightness: gentle neck ROM/ isometrics, high level balance and dynamic gait on compliant surfaces    Consulted and Agree with Plan of Care  Patient       Patient will benefit from skilled therapeutic intervention in order to improve the following deficits and impairments:  Abnormal gait, Decreased balance, Decreased range of motion, Decreased strength, Hypomobility, Impaired flexibility, Postural dysfunction, Difficulty walking, Impaired sensation  Visit Diagnosis: Muscle weakness (generalized)  Unsteadiness on feet  Cervicalgia     Problem List Patient Active Problem List   Diagnosis Date Noted  . History of GI diverticular bleed 09/11/2017  . Lower GI bleed   . Acute blood loss anemia   . Diverticulosis   . 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 Kidney transplant recipient 01/21/2017  . Anxiety 11/21/2016  . Stenosis of cervical spine with myelopathy ([facility]) 08/12/2016  . Routine general medical examination at a health care facility 06/01/2016  . Hypothyroidism 03/24/2015  . History of tachycardia 03/24/2015  . HTN (hypertension), benign 06/12/2013  . ESRD on dialysis [facility]) 06/12/2013    Willow Ora, PTA, [facility] [address], [place], [place] [address] [phone] 10/16/17, 8:38 PM   Name: [name] MRN: [id number] Date of Birth: 14-Jun-1960

## 2017-10-18 DIAGNOSIS — Z4822 Encounter for aftercare following kidney transplant: Secondary | ICD-10-CM | POA: Diagnosis not present

## 2017-10-18 DIAGNOSIS — Z940 Kidney transplant status: Secondary | ICD-10-CM | POA: Diagnosis not present

## 2017-10-19 ENCOUNTER — Encounter: Payer: Self-pay | Admitting: Physical Therapy

## 2017-10-19 ENCOUNTER — Ambulatory Visit: Payer: Medicare Other | Attending: Internal Medicine | Admitting: Physical Therapy

## 2017-10-19 DIAGNOSIS — M542 Cervicalgia: Secondary | ICD-10-CM | POA: Insufficient documentation

## 2017-10-19 DIAGNOSIS — R278 Other lack of coordination: Secondary | ICD-10-CM

## 2017-10-19 DIAGNOSIS — M21371 Foot drop, right foot: Secondary | ICD-10-CM | POA: Diagnosis not present

## 2017-10-19 DIAGNOSIS — M6281 Muscle weakness (generalized): Secondary | ICD-10-CM | POA: Insufficient documentation

## 2017-10-19 DIAGNOSIS — R2681 Unsteadiness on feet: Secondary | ICD-10-CM | POA: Insufficient documentation

## 2017-10-19 DIAGNOSIS — R2689 Other abnormalities of gait and mobility: Secondary | ICD-10-CM | POA: Diagnosis not present

## 2017-10-19 NOTE — Therapy (Signed)
[facility] [address] [place], [place], [address] Phone: [phone]   Fax:  [phone]  Physical Therapy Treatment  Patient Details  Name: [name] MRN: [id number] Date of Birth: 08/22/1960 Referring Provider (PT): Hoyt Koch, MD   Encounter Date: 10/19/2017  PT End of Session - 10/19/17 1206    Visit Number  4    Number of Visits  17    Date for PT Re-Evaluation  12/10/17    Authorization Type  Medicare - 10th visit PN    PT Start Time  1100    PT Stop Time  1145    PT Time Calculation (min)  45 min    Activity Tolerance  Patient tolerated treatment well    Behavior During Therapy  [facility] for tasks assessed/performed       Past Medical History:  Diagnosis Date  . Acute blood loss anemia   . Anemia   . Anxiety   . Arthritis   . Claustrophobia   . Diverticulosis   . ESRD (end stage renal disease) on dialysis ([facility])    "TTS; [facility]" (03/24/2015)  . GI bleed   . Glomerulonephritis   . Heart murmur   . HTN (hypertension)   . Hypothyroidism   . Kidney transplant recipient 01/21/2017  . Pancreatitis   . Renal insufficiency   . Secondary hyperparathyroidism ([facility])    [name] 05/11/2014    Past Surgical History:  Procedure Laterality Date  . ANTERIOR CERVICAL CORPECTOMY N/A 05/24/2017   Procedure: CORPECTOMY CERVICAL FIVE- CERVICAL SIX;  Surgeon: Ashok Pall, MD;  Location: [facility];  Service: Neurosurgery;  Laterality: N/A;  . ARTERIOVENOUS GRAFT PLACEMENT Right 1990's?  . ARTERIOVENOUS GRAFT PLACEMENT Left 07/2002   upper arm/notes 06/02/2010  . ARTERIOVENOUS GRAFT PLACEMENT Right 07/2003   upper arm/notes 06/02/2010  . AV FISTULA PLACEMENT Left 09/2000   upper arm/notes 06/02/2010  . BREAST BIOPSY Left unsure   benign  . COLONOSCOPY    . DILATATION & CURRETTAGE/HYSTEROSCOPY WITH RESECTOCOPE N/A 04/17/2013   Procedure: DILATATION & CURETTAGE/HYSTEROSCOPY WITH RESECTOCOPE;  Surgeon:  Marvene Staff, MD;  Location: [facility];  Service: Gynecology;  Laterality: N/A;  . DILATION AND CURETTAGE OF UTERUS    . EYE SURGERY    . KIDNEY TRANSPLANT  1997  . KIDNEY TRANSPLANT  01/21/2017   Dr. Harrington Challenger  . PARATHYROIDECTOMY  03/2000 05/12/2014   w/neck exploration & autotransplantation/notes 06/02/2010; w/neck exploration  . PARATHYROIDECTOMY N/A 05/12/2014   Procedure: PARATHYROIDECTOMY AND NECK EXPLORATION;  Surgeon: Armandina Gemma, MD;  Location: [facility];  Service: General;  Laterality: N/A;  . PERITONEAL CATHETER INSERTION    . PERITONEAL CATHETER REMOVAL  08/1999   [name] 06/02/2010  . POSTERIOR CERVICAL FUSION/FORAMINOTOMY N/A 05/30/2017   Procedure: POSTERIOR CERVICAL Arthrodesis Cervical three - four, cervical four - five, cervical five - six, cervical six - seven;  Surgeon: Ashok Pall, MD;  Location: [facility];  Service: Neurosurgery;  Laterality: N/A;  . POSTERIOR CERVICAL LAMINECTOMY N/A 08/12/2016   Procedure: POSTERIOR CERVICAL LAMINECTOMY CERVICAL THREE CERVICAL FOUR, CERVICAL FOUR CERVICAL FIVE, CERVICAL FIVE- CERVICAL SIX, CERVICAL SIX- CERVICAL SEVEN;  Surgeon: Ashok Pall, MD;  Location: [facility];  Service: Neurosurgery;  Laterality: N/A;  POSTERIOR  . RETINAL DETACHMENT SURGERY Left   . THROMBECTOMY Left 02/2002   fistula/notes 06/02/2010  . THROMBECTOMY / ARTERIOVENOUS GRAFT REVISION  11/2003; 01/2004; 08/07/2005; 08/10/2005; 10/2005   [name] 06/02/2010  . THROMBECTOMY AND REVISION OF ARTERIOVENTOUS (AV)  GORETEX  GRAFT Right 01/2002; 11/2003; 01/2004; 08/07/2004; 08/10/2004; 11/13/2005   [name] 5/1/2012Marland Kitchen [name] 5/16/2012Marland Kitchen [name] 5/16/2012Marland Kitchen [name] 5/16/2012Marland Kitchen [name] 06/02/2010; [name] 06/02/2010  . THROMBECTOMY AND REVISION OF ARTERIOVENTOUS (AV) GORETEX  GRAFT Left 08/23/2002; 09/13/2002; 07/08/2003   [name] 06/02/2010; [name] 06/02/2010; [name] 06/02/2010    There were no vitals filed for this visit.  Subjective Assessment - 10/19/17 1103    Subjective  Pt reports everything has been going  well with no new complaints. No recent falls. Pt reports continuing to experience neck tightness.     Limitations  House hold activities;Walking    How long can you walk comfortably?  When she first stands up and starts walking, R foot drags    Patient Stated Goals  Focus on core strength, neck and balance    Currently in Pain?  No/denies                       [facility] Adult PT Treatment/Exercise - 10/19/17 1157      Transfers   Transfers  Sit to Stand;Stand to Sit    Sit to Stand  7: Independent    Stand to Sit  7: Independent      Ambulation/Gait   Ambulation/Gait  Yes    Ambulation/Gait Assistance  5: Supervision    Ambulation/Gait Assistance Details  Pt performs multiple trials of ambulation with no AD on level surfaces with lower cuff bioness device for increasing R tibialis anterior activation for reduction of foot drop during gait. Pt progressed to performing multiple trials while navigating obstacles including stepping over 4 inch step and stepping over obstacles of varying heights. During initial attempt with obstacle negotiation, Pt demonstrated LOB to her R with ability to self correct and attempted to circumduct R LE when stepping over a higher obstacle. Therapist provided verbal cues to increase hip flexion and DF with bioness activation to promote clearance. Pt verbalized and demonstrated understanding during subsequent trial with good carryover.     Ambulation Distance (Feet)  230 Feet   230 x 2 with incorporation of obstacles    Assistive device  None    Gait Pattern  Step-through pattern;Poor foot clearance - right;Decreased hip/knee flexion - right;Decreased hip/knee flexion - left;Decreased dorsiflexion - right;Decreased dorsiflexion - left    Ambulation Surface  Indoor;Level      Exercises   Exercises  Other Exercises    Other Exercises   Pt performs 1x10 reps of bridging with 5-10 second hold at top to increase core activation to promote stabilization. Pt  then performs 1x10 reps of single leg resisted hip flexion isometric holds to increase core activation with therapist providing VC's for correct breathing to prevent valsalva manuever. Pt then performs 1x10 reps of windshield wipers with blue theraball in supine with proper technique.       Modalities   Modalities  Teacher, English as a foreign language Location  Bioness lower cuff to promote R tibialis anterior activation to prevent R foot drop     Electrical Stimulation Action  Increasing R tibialis anterior activation during gait for increased foot clearance     Electrical Stimulation Parameters  Parameters set on tablet 2    Electrical Stimulation Goals  Neuromuscular facilitation;Strength             PT Education - 10/19/17 1155    Education Details  Therapist provided education regarding clinical implications for Bioness to increase tibialis anterior activation to reduce  R foot drop, updated core stabilization exercises to HEP, and to continue addressing cervical tightness in upcoming therapy visits and reassess whether she may benefit from visiting outpatient for further treatment.     Person(s) Educated  Patient    Methods  Explanation    Comprehension  Returned demonstration;Verbalized understanding       PT Short Term Goals - 10/11/17 2142      PT SHORT TERM GOAL #1   Title  Pt will be independent with initial HEP in order to indicate improved functional mobility and decreased fall risk.     Time  4    Period  Weeks    Status  New    Target Date  11/10/17      PT SHORT TERM GOAL #2   Title  Pt will improve 5TSS to </=11 secs without UE support in order to indicate decreased fall risk and improved functional strength.      Baseline  12 seconds without use of UE    Time  4    Period  Weeks    Status  New    Target Date  11/10/17      PT SHORT TERM GOAL #3   Title  Pt will improve FGA to >/= 24/30 in order to indicate decreased  fall risk.      Baseline  21/30    Time  4    Period  Weeks    Status  New    Target Date  11/10/17      PT SHORT TERM GOAL #4   Title  Pt will ambulate at gait speed of >/=3.6 ft/sec with decreased indication of R foot slap/overt gait deviations indicating safe gait speed.     Baseline  3.54 ft/sec     Time  4    Period  Weeks    Status  New    Target Date  11/10/17      PT SHORT TERM GOAL #5   Title  Pt will tolerate set up and use of Bioness functional electrical stimulation for DF assist on RLE    Time  4    Period  Weeks    Status  New    Target Date  11/10/17        PT Long Term Goals - 10/11/17 2145      PT LONG TERM GOAL #1   Title  Pt will be independent with final HEP in order to indicate improved functional mobility and decreased fall risk.     Time  8    Period  Weeks    Status  New    Target Date  12/10/17      PT LONG TERM GOAL #2   Title  Pt will improve FGA to >/=27/30 in order to indicate decreased fall risk.      Time  8    Period  Weeks    Status  New    Target Date  12/10/17      PT LONG TERM GOAL #3   Title  Pt will improve gait velocity to >/= 3.8 ft/sec with decreased evidence of R foot drop and genu recurvatum to decrease falls risk in community    Time  8    Period  Weeks    Status  New    Target Date  12/10/17      PT LONG TERM GOAL #4   Title  Pt will ambulate >1000' over varying outdoor surfaces with decreased incidence  of R foot slap/drag and increased foot clearance to indicate safer ambulation in community    Time  8    Period  Weeks    Status  New    Target Date  12/10/17      PT LONG TERM GOAL #5   Title  Pt will improve cervical rotation by 10 deg bilaterally in order to indicate improved safety with driving      Baseline  40 deg bilaterally    Time  8    Period  Weeks    Status  New    Target Date  12/10/17            Plan - 10/19/17 1141    Clinical Impression Statement  Today's session focused on updating  patient's HEP to incorporate core stablization exercises with patient verbalizing and demonstrating correct technique. Patient tolerated gait training with R bioness lower cuff to promote increased tibialis anterior activation to prevent R foot drop during ambulation trials. Pt demonstrates good carry over with ambulation with reduction in R foot slap and ability to step over obstacles of various heights with therapist providing supervision. Patient will continue to benefit from skilled physical therapy to address cervical tightness, balance and strength deficits, and improve safety with functional mobility.     Rehab Potential  Good    Clinical Impairments Affecting Rehab Potential  severity of cord compression     PT Frequency  2x / week    PT Duration  8 weeks    PT Treatment/Interventions  ADLs/Self Care Home Management;Electrical Stimulation;Gait training;Stair training;Functional mobility training;Therapeutic activities;Therapeutic exercise;Balance training;Neuromuscular re-education;Patient/family education;Orthotic Fit/Training;Passive range of motion;Manual techniques;Aquatic Therapy;Moist Heat;Taping    PT Next Visit Plan  MD SAID NO TO DRY NEEDLING; Bioness for RLE.  Core strengthening and RLE strengthening.  Continue to address cervical tightness: gentle neck ROM/ isometrics, high level balance and dynamic gait on compliant surfaces.  If after a few sessions pt is still having increased neck tightness, she may benefit from 1-2 more appt at [address] for instrument assisted myofascial release    Consulted and Agree with Plan of Care  Patient       Patient will benefit from skilled therapeutic intervention in order to improve the following deficits and impairments:  Abnormal gait, Decreased balance, Decreased range of motion, Decreased strength, Hypomobility, Impaired flexibility, Postural dysfunction, Difficulty walking, Impaired sensation  Visit Diagnosis: Muscle weakness  (generalized)  Unsteadiness on feet  Foot drop, right  Other lack of coordination  Other abnormalities of gait and mobility     Problem List Patient Active Problem List   Diagnosis Date Noted  . History of GI diverticular bleed 09/11/2017  . Lower GI bleed   . Acute blood loss anemia   . Diverticulosis   . 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 Kidney transplant recipient 01/21/2017  . Anxiety 11/21/2016  . Stenosis of cervical spine with myelopathy ([facility]) 08/12/2016  . Routine general medical examination at a health care facility 06/01/2016  . Hypothyroidism 03/24/2015  . History of tachycardia 03/24/2015  . HTN (hypertension), benign 06/12/2013  . ESRD on dialysis [facility]) 06/12/2013    Floreen Comber, SPT 10/19/2017, 12:11 PM  [facility] [address] [place], [place], [address] Phone: [phone]   Fax:  [phone]  Name: [name] MRN: [id number] Date of Birth: [birth year]

## 2017-10-19 NOTE — Patient Instructions (Addendum)
Bridging    Lie on back, knees bent, feet flat on surface. Breathe in. Raise buttocks and hold position __5_ seconds, breathing out through pursed lips. Return to start position, breathing in. Remember: do not hold your breath. Repeat _3 sets of 10 reps with 5 second hold at top for increased core activation. Do _1__ sessions per day. Variation: Lie on bed, not on floor.  Copyright  VHI. All rights reserved.  Unilateral Isometric Hip Flexion    Tighten stomach and raise right knee to outstretched arm. Push gently, keeping arm straight, trunk rigid. Hold __5__ seconds. Repeat __10__ times per set. Do _3___ sets per session. Do __1__ sessions per day.  http://orth.exer.us/1099   Copyright  VHI. All rights reserved.

## 2017-10-24 ENCOUNTER — Ambulatory Visit: Payer: Medicare Other | Admitting: Physical Therapy

## 2017-10-24 ENCOUNTER — Encounter: Payer: Self-pay | Admitting: Physical Therapy

## 2017-10-24 DIAGNOSIS — R2681 Unsteadiness on feet: Secondary | ICD-10-CM

## 2017-10-24 DIAGNOSIS — M542 Cervicalgia: Secondary | ICD-10-CM

## 2017-10-24 DIAGNOSIS — R278 Other lack of coordination: Secondary | ICD-10-CM | POA: Diagnosis not present

## 2017-10-24 DIAGNOSIS — M6281 Muscle weakness (generalized): Secondary | ICD-10-CM

## 2017-10-24 DIAGNOSIS — R2689 Other abnormalities of gait and mobility: Secondary | ICD-10-CM | POA: Diagnosis not present

## 2017-10-24 DIAGNOSIS — M21371 Foot drop, right foot: Secondary | ICD-10-CM | POA: Diagnosis not present

## 2017-10-25 NOTE — Therapy (Signed)
[facility] [address] [place], [place], [address] Phone: [phone]   Fax:  [phone]  Physical Therapy Treatment  Patient Details  Name: [name] MRN: [id number] Date of Birth: [birth year] Referring Provider (PT): Hoyt Koch, MD   Encounter Date: 10/24/2017  PT End of Session - 10/24/17 1537    Visit Number  5    Number of Visits  17    Date for PT Re-Evaluation  12/10/17    Authorization Type  Medicare - 10th visit PN    PT Start Time  5732    PT Stop Time  1615    PT Time Calculation (min)  42 min    Equipment Utilized During Treatment  Gait belt    Activity Tolerance  Patient tolerated treatment well;No increased pain    Behavior During Therapy  WFL for tasks assessed/performed       Past Medical History:  Diagnosis Date  . Acute blood loss anemia   . Anemia   . Anxiety   . Arthritis   . Claustrophobia   . Diverticulosis   . ESRD (end stage renal disease) on dialysis ([place])    "TTS; [facility]" (03/24/2015)  . GI bleed   . Glomerulonephritis   . Heart murmur   . HTN (hypertension)   . Hypothyroidism   . Kidney transplant recipient 01/21/2017  . Pancreatitis   . Renal insufficiency   . Secondary hyperparathyroidism ([place])    [name] 05/11/2014    Past Surgical History:  Procedure Laterality Date  . ANTERIOR CERVICAL CORPECTOMY N/A 05/24/2017   Procedure: CORPECTOMY CERVICAL FIVE- CERVICAL SIX;  Surgeon: Ashok Pall, MD;  Location: [facility];  Service: Neurosurgery;  Laterality: N/A;  . ARTERIOVENOUS GRAFT PLACEMENT Right 1990's?  . ARTERIOVENOUS GRAFT PLACEMENT Left 07/2002   upper arm/notes 06/02/2010  . ARTERIOVENOUS GRAFT PLACEMENT Right 07/2003   upper arm/notes 06/02/2010  . AV FISTULA PLACEMENT Left 09/2000   upper arm/notes 06/02/2010  . BREAST BIOPSY Left unsure   benign  . COLONOSCOPY    . DILATATION & CURRETTAGE/HYSTEROSCOPY WITH RESECTOCOPE N/A 04/17/2013   Procedure: DILATATION & CURETTAGE/HYSTEROSCOPY WITH RESECTOCOPE;  Surgeon: Marvene Staff, MD;  Location: [facility];  Service: Gynecology;  Laterality: N/A;  . DILATION AND CURETTAGE OF UTERUS    . EYE SURGERY    . KIDNEY TRANSPLANT  1997  . KIDNEY TRANSPLANT  01/21/2017   Dr. Harrington Challenger  . PARATHYROIDECTOMY  03/2000 05/12/2014   w/neck exploration & autotransplantation/notes 06/02/2010; w/neck exploration  . PARATHYROIDECTOMY N/A 05/12/2014   Procedure: PARATHYROIDECTOMY AND NECK EXPLORATION;  Surgeon: Armandina Gemma, MD;  Location: [facility];  Service: General;  Laterality: N/A;  . PERITONEAL CATHETER INSERTION    . PERITONEAL CATHETER REMOVAL  08/1999   [name] 06/02/2010  . POSTERIOR CERVICAL FUSION/FORAMINOTOMY N/A 05/30/2017   Procedure: POSTERIOR CERVICAL Arthrodesis Cervical three - four, cervical four - five, cervical five - six, cervical six - seven;  Surgeon: Ashok Pall, MD;  Location: [facility];  Service: Neurosurgery;  Laterality: N/A;  . POSTERIOR CERVICAL LAMINECTOMY N/A 08/12/2016   Procedure: POSTERIOR CERVICAL LAMINECTOMY CERVICAL THREE CERVICAL FOUR, CERVICAL FOUR CERVICAL FIVE, CERVICAL FIVE- CERVICAL SIX, CERVICAL SIX- CERVICAL SEVEN;  Surgeon: Ashok Pall, MD;  Location: [facility];  Service: Neurosurgery;  Laterality: N/A;  POSTERIOR  . RETINAL DETACHMENT SURGERY Left   . THROMBECTOMY Left 02/2002   fistula/notes 06/02/2010  . THROMBECTOMY / ARTERIOVENOUS GRAFT REVISION  11/2003; 01/2004; 08/07/2005; 08/10/2005; 10/2005   /  notes 06/02/2010  . THROMBECTOMY AND REVISION OF ARTERIOVENTOUS (AV) GORETEX  GRAFT Right 01/2002; 11/2003; 01/2004; 08/07/2004; 08/10/2004; 11/13/2005   [name] 5/1/2012Marland Kitchen [name] 5/16/2012Marland Kitchen [name] 5/16/2012Marland Kitchen [name] 5/16/2012Marland Kitchen [name] 06/02/2010; [name] 06/02/2010  . THROMBECTOMY AND REVISION OF ARTERIOVENTOUS (AV) GORETEX  GRAFT Left 08/23/2002; 09/13/2002; 07/08/2003   [name] 06/02/2010; [name] 06/02/2010; [name] 06/02/2010    There were no vitals filed for this visit.  Subjective  Assessment - 10/24/17 1536    Subjective  Did a lot of walking this past weekend at [facility] in [place], [place]. Feels the Bioness may have helped her endurance with that. No falls or pain. Has tightness in neck today, say's it comes and goes.     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  When she first stands up and starts walking, R foot drags    Patient Stated Goals  Focus on core strength, neck and balance    Currently in Pain?  No/denies    Pain Score  0-No pain         [facility] Adult PT Treatment/Exercise - 10/24/17 1538      Neck Exercises: Seated   Other Seated Exercise  seated posterior shoulder rolls after manual thearpy for 10 reps.       Manual Therapy   Manual Therapy  Soft tissue mobilization;Manual Traction;Neural Stretch;Muscle Energy Technique    Manual therapy comments  All manual therapy performed to decreased tightness and improve cervical ROM. Pt continues with palpable trigger points in upper traps, rhombiods and subscapular muscles. Improved after manual therapy.      Soft tissue mobilization  to bil uppert traps, scalenes and levators in supine; to upper/min traps, subscapular muscles in sitting.     Manual Traction  suboccipital release for 30 sec hold x 4 reps; gentle cervical distraction progressing to gentle cervical distaction concurrent with scapular depression x 10 reps, then concurrent with scapular retraction for 10 reps.     Muscle Energy Technique  see above comments    Neural Stretch  concurrent with gentle manual distraction for overpressure to shoulder for increased stretching, added  cervical rotation with chin tuck toward opposite side. 30 sec holds for 3 reps each side with each stretch.           Balance Exercises - 10/24/17 1554      Balance Exercises: Standing   Rockerboard  Anterior/posterior;Lateral;Head turns;EO;EC;30 seconds;10 reps      Balance Exercises: Standing   Rebounder Limitations  performed both ways on balance board with no UE support: rocking board with emphasis on tall posture with EO, progressing to EC; then holding the board steady EC no head movements, progressing to EC head movements left<>right, then up<>down, then diagonals both ways. min guard to min assist for balance. cues on posture and weight shifting to assist with balance.           PT Short Term Goals - 10/11/17 2142      PT SHORT TERM GOAL #1   Title  Pt will be independent with initial HEP in order to indicate improved functional mobility and decreased fall risk.     Time  4    Period  Weeks    Status  New    Target Date  11/10/17      PT SHORT TERM GOAL #2   Title  Pt will improve 5TSS to </=11 secs without UE support in order to indicate decreased fall risk and improved functional strength.      Baseline  12 seconds without use of UE    Time  4    Period  Weeks    Status  New    Target Date  11/10/17      PT SHORT TERM GOAL #3   Title  Pt will improve FGA to >/= 24/30 in order to indicate decreased fall risk.      Baseline  21/30    Time  4    Period  Weeks    Status  New    Target Date  11/10/17      PT SHORT TERM GOAL #4   Title  Pt will ambulate at gait speed of >/=3.6 ft/sec with decreased indication of R foot slap/overt gait deviations indicating safe gait speed.     Baseline  3.54 ft/sec     Time  4    Period  Weeks    Status  New    Target Date  11/10/17      PT SHORT TERM GOAL #5   Title  Pt will tolerate set up and use of Bioness functional electrical stimulation for DF assist on RLE    Time  4    Period  Weeks    Status  New    Target Date   11/10/17        PT Long Term Goals - 10/11/17 2145      PT LONG TERM GOAL #1   Title  Pt will be independent with final HEP in order to indicate improved functional mobility and decreased fall risk.     Time  8    Period  Weeks    Status  New    Target Date  12/10/17      PT LONG TERM GOAL #2   Title  Pt will improve FGA to >/=27/30 in order to indicate decreased fall risk.      Time  8    Period  Weeks    Status  New    Target Date  12/10/17      PT LONG TERM GOAL #3   Title  Pt will improve gait velocity to >/= 3.8 ft/sec with decreased evidence of R foot drop and genu recurvatum to decrease falls risk in community    Time  8    Period  Weeks    Status  New    Target Date  12/10/17      PT LONG TERM GOAL #4   Title  Pt will ambulate >1000' over varying outdoor surfaces with decreased incidence of R foot slap/drag and increased foot clearance to indicate safer ambulation in community    Time  8    Period  Weeks    Status  New    Target Date  12/10/17      PT LONG TERM GOAL #5   Title  Pt will improve cervical rotation by 10 deg bilaterally in order to indicate improved safety with driving      Baseline  40 deg bilaterally    Time  8    Period  Weeks    Status  New    Target Date  12/10/17  Plan - 10/24/17 1538    Clinical Impression Statement  Today' skilled session focused initially on cervical/upper spine muslce tightness. Pt continues with palpable trigger points with some release noted after manual therapy. Remainder of session continued to address balance reactions with no significant issues reported. Did not use Bioness today as pt wore skinny jeans due to colder weather. Will continue with Bioness as able in session for further muslce strengthening. The pt is progressing toward goals and should benefit from continued PT to progress toward unmet goals .     Rehab Potential  Good    Clinical Impairments Affecting Rehab Potential  severity of cord  compression     PT Frequency  2x / week    PT Duration  8 weeks    PT Treatment/Interventions  ADLs/Self Care Home Management;Electrical Stimulation;Gait training;Stair training;Functional mobility training;Therapeutic activities;Therapeutic exercise;Balance training;Neuromuscular re-education;Patient/family education;Orthotic Fit/Training;Passive range of motion;Manual techniques;Aquatic Therapy;Moist Heat;Taping    PT Next Visit Plan  MD SAID NO TO DRY NEEDLING; continue to address cervical/upper spine muscle tightness as needed (? couple visists at [facility] for [facility]), continue to address balance and LE strengthening, Bioness to right LE as able with session for improved muscle strengthening.     Consulted and Agree with Plan of Care  Patient       Patient will benefit from skilled therapeutic intervention in order to improve the following deficits and impairments:  Abnormal gait, Decreased balance, Decreased range of motion, Decreased strength, Hypomobility, Impaired flexibility, Postural dysfunction, Difficulty walking, Impaired sensation  Visit Diagnosis: Muscle weakness (generalized)  Unsteadiness on feet  Foot drop, right  Cervicalgia     Problem List Patient Active Problem List   Diagnosis Date Noted  . History of GI diverticular bleed 09/11/2017  . Lower GI bleed   . Acute blood loss anemia   . Diverticulosis   . 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 Kidney transplant recipient 01/21/2017  . Anxiety 11/21/2016  . Stenosis of cervical spine with myelopathy ([facility]) 08/12/2016  . Routine general medical examination at a health care facility 06/01/2016  . Hypothyroidism 03/24/2015  . History of tachycardia 03/24/2015  . HTN (hypertension), benign 06/12/2013  . ESRD on dialysis [facility]) 06/12/2013    Willow Ora, PTA, [facility] [address] [place], [place] [address] [phone] 10/25/17, 10:26 AM   Name: [name] MRN: [id number] Date  of Birth: 07-Aug-1960

## 2017-10-26 ENCOUNTER — Encounter: Payer: Self-pay | Admitting: Physical Therapy

## 2017-10-26 ENCOUNTER — Ambulatory Visit: Payer: Medicare Other | Admitting: Physical Therapy

## 2017-10-26 DIAGNOSIS — R2681 Unsteadiness on feet: Secondary | ICD-10-CM | POA: Diagnosis not present

## 2017-10-26 DIAGNOSIS — R278 Other lack of coordination: Secondary | ICD-10-CM | POA: Diagnosis not present

## 2017-10-26 DIAGNOSIS — M542 Cervicalgia: Secondary | ICD-10-CM | POA: Diagnosis not present

## 2017-10-26 DIAGNOSIS — M6281 Muscle weakness (generalized): Secondary | ICD-10-CM

## 2017-10-26 DIAGNOSIS — R2689 Other abnormalities of gait and mobility: Secondary | ICD-10-CM

## 2017-10-26 DIAGNOSIS — M21371 Foot drop, right foot: Secondary | ICD-10-CM | POA: Diagnosis not present

## 2017-10-26 NOTE — Therapy (Signed)
[facility] [address] [place], [place], [address] Phone: [phone]   Fax:  [phone]  Physical Therapy Treatment  Patient Details  Name: [name] MRN: [id number] Date of Birth: 02-14-1960 Referring Provider (PT): Hoyt Koch, MD   Encounter Date: 10/26/2017  PT End of Session - 10/26/17 1249    Visit Number  6    Number of Visits  17    Date for PT Re-Evaluation  12/10/17    Authorization Type  Medicare - 10th visit PN    PT Start Time  1100    PT Stop Time  1145    PT Time Calculation (min)  45 min    Equipment Utilized During Treatment  Gait belt    Activity Tolerance  Patient tolerated treatment well;No increased pain    Behavior During Therapy  WFL for tasks assessed/performed       Past Medical History:  Diagnosis Date  . Acute blood loss anemia   . Anemia   . Anxiety   . Arthritis   . Claustrophobia   . Diverticulosis   . ESRD (end stage renal disease) on dialysis ([place])    "TTS; [place]" (03/24/2015)  . GI bleed   . Glomerulonephritis   . Heart murmur   . HTN (hypertension)   . Hypothyroidism   . Kidney transplant recipient 01/21/2017  . Pancreatitis   . Renal insufficiency   . Secondary hyperparathyroidism ([place])    [name] 05/11/2014    Past Surgical History:  Procedure Laterality Date  . ANTERIOR CERVICAL CORPECTOMY N/A 05/24/2017   Procedure: CORPECTOMY CERVICAL FIVE- CERVICAL SIX;  Surgeon: Ashok Pall, MD;  Location: [facility];  Service: Neurosurgery;  Laterality: N/A;  . ARTERIOVENOUS GRAFT PLACEMENT Right 1990's?  . ARTERIOVENOUS GRAFT PLACEMENT Left 07/2002   upper arm/notes 06/02/2010  . ARTERIOVENOUS GRAFT PLACEMENT Right 07/2003   upper arm/notes 06/02/2010  . AV FISTULA PLACEMENT Left 09/2000   upper arm/notes 06/02/2010  . BREAST BIOPSY Left unsure   benign  . COLONOSCOPY    . DILATATION & CURRETTAGE/HYSTEROSCOPY WITH RESECTOCOPE N/A 04/17/2013    Procedure: DILATATION & CURETTAGE/HYSTEROSCOPY WITH RESECTOCOPE;  Surgeon: Marvene Staff, MD;  Location: [facility];  Service: Gynecology;  Laterality: N/A;  . DILATION AND CURETTAGE OF UTERUS    . EYE SURGERY    . KIDNEY TRANSPLANT  1997  . KIDNEY TRANSPLANT  01/21/2017   Dr. Harrington Challenger  . PARATHYROIDECTOMY  03/2000 05/12/2014   w/neck exploration & autotransplantation/notes 06/02/2010; w/neck exploration  . PARATHYROIDECTOMY N/A 05/12/2014   Procedure: PARATHYROIDECTOMY AND NECK EXPLORATION;  Surgeon: Armandina Gemma, MD;  Location: [facility];  Service: General;  Laterality: N/A;  . PERITONEAL CATHETER INSERTION    . PERITONEAL CATHETER REMOVAL  08/1999   [name] 06/02/2010  . POSTERIOR CERVICAL FUSION/FORAMINOTOMY N/A 05/30/2017   Procedure: POSTERIOR CERVICAL Arthrodesis Cervical three - four, cervical four - five, cervical five - six, cervical six - seven;  Surgeon: Ashok Pall, MD;  Location: [facility];  Service: Neurosurgery;  Laterality: N/A;  . POSTERIOR CERVICAL LAMINECTOMY N/A 08/12/2016   Procedure: POSTERIOR CERVICAL LAMINECTOMY CERVICAL THREE CERVICAL FOUR, CERVICAL FOUR CERVICAL FIVE, CERVICAL FIVE- CERVICAL SIX, CERVICAL SIX- CERVICAL SEVEN;  Surgeon: Ashok Pall, MD;  Location: [facility];  Service: Neurosurgery;  Laterality: N/A;  POSTERIOR  . RETINAL DETACHMENT SURGERY Left   . THROMBECTOMY Left 02/2002   fistula/notes 06/02/2010  . THROMBECTOMY / ARTERIOVENOUS GRAFT REVISION  11/2003; 01/2004; 08/07/2005; 08/10/2005; 10/2005   /  notes 06/02/2010  . THROMBECTOMY AND REVISION OF ARTERIOVENTOUS (AV) GORETEX  GRAFT Right 01/2002; 11/2003; 01/2004; 08/07/2004; 08/10/2004; 11/13/2005   [name] 5/1/2012Marland Kitchen [name] 5/16/2012Marland Kitchen [name] 5/16/2012Marland Kitchen [name] 5/16/2012Marland Kitchen [name] 06/02/2010; [name] 06/02/2010  . THROMBECTOMY AND REVISION OF ARTERIOVENTOUS (AV) GORETEX  GRAFT Left 08/23/2002; 09/13/2002; 07/08/2003   [name] 06/02/2010; [name] 06/02/2010; [name] 06/02/2010    There were no vitals filed for this visit.  Subjective  Assessment - 10/26/17 1103    Subjective  Pt reports she is doing well and believes the Bioness helped her with her walking and endurance. In total she was able to walk 8,000 steps with her cousin and felt really tired after but believes Bioness helped her achieve that many steps. Reports her core strengthening exercises are going well.     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  When she first stands up and starts walking, R foot drags    Patient Stated Goals  Focus on core strength, neck and balance    Currently in Pain?  No/denies                       [facility] Adult PT Treatment/Exercise - 10/26/17 1236      Transfers   Transfers  Sit to Stand;Stand to Sit    Sit to Stand  7: Independent    Stand to Sit  7: Independent      Ambulation/Gait   Ambulation/Gait  Yes    Ambulation/Gait Assistance  5: Supervision    Ambulation/Gait Assistance Details  Pt performs x1 ambulation trial with Bioness lower cuff applied to R tibialis anterior for increased dorsiflexion during swing phase and initial contact to optimize gait pattern with reduction in foot drag. Pt demonstrates observable improvement in step length bilaterally during ambulation trials with no observable R foot drag noted. Note that onset of foot drag may be evident with increased ambulation distances.     Ambulation Distance (Feet)  230 Feet    Assistive device  None    Gait Pattern  Step-through pattern;Poor foot clearance - right;Decreased hip/knee flexion - right;Decreased hip/knee flexion - left;Decreased dorsiflexion - right;Decreased dorsiflexion  - left;Decreased stance time - right;Decreased weight shift to right    Ambulation Surface  Level;Indoor      Exercises   Exercises  Other Exercises;Knee/Hip    Other Exercises   Pt performs 1x8 reps forward stepping with each LE on ramp incline to visual target to improve step length and weightshifting over leading LE. Pt performs step length exercise with R lower cuff bioness applied to improve dorsi flexion activation during initial contact. Therapist provides manual facilitation to pelvis to improve proper weight shifting over stepping extremity.      Pilates   Pilates Reformer         Knee/Hip Exercises: Aerobic   Tread Mill  Pt performs forward walking on treadmill on 1.3 MPH for 10 minute duration and 0.7 MPH for 2 minute duration for total of 12 minutes. Pt performs treadmill exercise with R lower cuff bioness applied to tibialis anterior for increased DF during initial contact and swing phases of gait. Pt demonstrates decreased L LE step length 2/2 to dec R stance  duration when Pt attempts to decrease UE support on treadmill. Therapist provided manual facilitation to pelvis and R LE to increase step length and heel strike during initial contact.       Theme park manager  Bioness lower cuff to promote R tibialis anterior activation to prevent R foot drop     Electrical Stimulation Action  Increase tibialis anterior activation during swing phase and initial contact of gait cycle     Electrical Stimulation Parameters  parameters set on tablet 2    Electrical Stimulation Goals  Neuromuscular facilitation;Strength             PT Education - 10/26/17 1247    Education Details  Therapist provided education regarding building up her activity tolerance with stepping and to take frequent rest breaks as needed. Therapist educated patient on taking bigger steps in the community to minimize R foot drag to reduce fall risk potential.    Person(s) Educated   Patient    Methods  Explanation    Comprehension  Verbalized understanding       PT Short Term Goals - 10/11/17 2142      PT SHORT TERM GOAL #1   Title  Pt will be independent with initial HEP in order to indicate improved functional mobility and decreased fall risk.     Time  4    Period  Weeks    Status  New    Target Date  11/10/17      PT SHORT TERM GOAL #2   Title  Pt will improve 5TSS to </=11 secs without UE support in order to indicate decreased fall risk and improved functional strength.      Baseline  12 seconds without use of UE    Time  4    Period  Weeks    Status  New    Target Date  11/10/17      PT SHORT TERM GOAL #3   Title  Pt will improve FGA to >/= 24/30 in order to indicate decreased fall risk.      Baseline  21/30    Time  4    Period  Weeks    Status  New    Target Date  11/10/17      PT SHORT TERM GOAL #4   Title  Pt will ambulate at gait speed of >/=3.6 ft/sec with decreased indication of R foot slap/overt gait deviations indicating safe gait speed.     Baseline  3.54 ft/sec     Time  4    Period  Weeks    Status  New    Target Date  11/10/17      PT SHORT TERM GOAL #5   Title  Pt will tolerate set up and use of Bioness functional electrical stimulation for DF assist on RLE    Time  4    Period  Weeks    Status  New    Target Date  11/10/17        PT Long Term Goals - 10/11/17 2145      PT LONG TERM GOAL #1   Title  Pt will be independent with final HEP in order to indicate improved functional mobility and decreased fall risk.     Time  8    Period  Weeks    Status  New    Target Date  12/10/17      PT LONG TERM GOAL #2   Title  Pt will improve FGA to >/=27/30 in order to indicate decreased fall risk.      Time  8    Period  Weeks    Status  New    Target Date  12/10/17      PT LONG TERM GOAL #3   Title  Pt will improve gait velocity to >/= 3.8 ft/sec with decreased evidence of R foot drop and genu recurvatum to decrease  falls risk in community    Time  8    Period  Weeks    Status  New    Target Date  12/10/17      PT LONG TERM GOAL #4   Title  Pt will ambulate >1000' over varying outdoor surfaces with decreased incidence of R foot slap/drag and increased foot clearance to indicate safer ambulation in community    Time  8    Period  Weeks    Status  New    Target Date  12/10/17      PT LONG TERM GOAL #5   Title  Pt will improve cervical rotation by 10 deg bilaterally in order to indicate improved safety with driving      Baseline  40 deg bilaterally    Time  8    Period  Weeks    Status  New    Target Date  12/10/17            Plan - 10/26/17 1249    Clinical Impression Statement  Today's skilled session focused on improving step length and reducing R foot drop by using bioness to R tibialis anterior to improve dorsiflexion activation during appropriate times in the gait cycle. Pt demonstrates improvement in R foot clearance with bioness lower cuff applied and therapist providing multimodal cues to optimize gait sequencing. Pt demonstrates observable improvements in step length with reassessment of carry over during overground walking. Pt will continue to benefit from skilled physical therapy to address balance, strength, and gait deficits to optimize sequencing and improve functional mobility.     Rehab Potential  Good    Clinical Impairments Affecting Rehab Potential  severity of cord compression     PT Frequency  2x / week    PT Duration  8 weeks    PT Treatment/Interventions  ADLs/Self Care Home Management;Electrical Stimulation;Gait training;Stair training;Functional mobility training;Therapeutic activities;Therapeutic exercise;Balance training;Neuromuscular re-education;Patient/family education;Orthotic Fit/Training;Passive range of motion;Manual techniques;Aquatic Therapy;Moist Heat;Taping    PT Next Visit Plan  MD SAID NO TO DRY NEEDLING; backwards walking on treadmill with bioness, R step  length activity with beam and targets on ramp with resistance, LE strengthening with special consideration to hamstrings, continue to address cervical/upper spine muscle tightness as needed (? couple visists at [facility] for [facility]), continue to address balance and LE strengthening, Bioness to right LE as able with session for improved muscle strengthening.     Consulted and Agree with Plan of Care  Patient       Patient will benefit from skilled therapeutic intervention in order to improve the following deficits and impairments:  Abnormal gait, Decreased balance, Decreased range of motion, Decreased strength, Hypomobility, Impaired flexibility, Postural dysfunction, Difficulty walking, Impaired sensation  Visit Diagnosis: Muscle weakness (generalized)  Unsteadiness on feet  Other lack of coordination  Other abnormalities of gait and mobility  Foot drop, right     Problem List Patient Active Problem List   Diagnosis Date Noted  . History of GI diverticular bleed 09/11/2017  . Lower GI bleed   .  Acute blood loss anemia   . Diverticulosis   . 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 Kidney transplant recipient 01/21/2017  . Anxiety 11/21/2016  . Stenosis of cervical spine with myelopathy ([place]) 08/12/2016  . Routine general medical examination at a health care facility 06/01/2016  . Hypothyroidism 03/24/2015  . History of tachycardia 03/24/2015  . HTN (hypertension), benign 06/12/2013  . ESRD on dialysis [facility]) 06/12/2013    Floreen Comber, SPT 10/26/2017, 12:55 PM  [facility] [address] [place], [place], [address] Phone: [phone]   Fax:  [phone]  Name: [name] MRN: [id number] Date of Birth: 1960/05/07

## 2017-10-30 ENCOUNTER — Ambulatory Visit: Payer: Medicare Other | Admitting: Rehabilitation

## 2017-10-30 ENCOUNTER — Encounter: Payer: Self-pay | Admitting: Rehabilitation

## 2017-10-30 DIAGNOSIS — M21371 Foot drop, right foot: Secondary | ICD-10-CM | POA: Diagnosis not present

## 2017-10-30 DIAGNOSIS — R278 Other lack of coordination: Secondary | ICD-10-CM | POA: Diagnosis not present

## 2017-10-30 DIAGNOSIS — M6281 Muscle weakness (generalized): Secondary | ICD-10-CM | POA: Diagnosis not present

## 2017-10-30 DIAGNOSIS — R2681 Unsteadiness on feet: Secondary | ICD-10-CM | POA: Diagnosis not present

## 2017-10-30 DIAGNOSIS — M542 Cervicalgia: Secondary | ICD-10-CM | POA: Diagnosis not present

## 2017-10-30 DIAGNOSIS — R2689 Other abnormalities of gait and mobility: Secondary | ICD-10-CM | POA: Diagnosis not present

## 2017-10-30 NOTE — Therapy (Signed)
[name] [address] [place], [place], [address] Phone: [phone]   Fax:  [phone]  Physical Therapy Treatment  Patient Details  Name: [name] MRN: [id number] Date of Birth: August 09, 1960 Referring Provider (PT): Hoyt Koch, MD   Encounter Date: 10/30/2017  PT End of Session - 10/30/17 1254    Visit Number  7    Number of Visits  17    Date for PT Re-Evaluation  12/10/17    Authorization Type  Medicare - 10th visit PN    PT Start Time  1030    PT Stop Time  1100    PT Time Calculation (min)  30 min    Activity Tolerance  Patient tolerated treatment well;No increased pain    Behavior During Therapy  WFL for tasks assessed/performed       Past Medical History:  Diagnosis Date  . Acute blood loss anemia   . Anemia   . Anxiety   . Arthritis   . Claustrophobia   . Diverticulosis   . ESRD (end stage renal disease) on dialysis ([facility])    "TTS; [facility]" (03/24/2015)  . GI bleed   . Glomerulonephritis   . Heart murmur   . HTN (hypertension)   . Hypothyroidism   . Kidney transplant recipient 01/21/2017  . Pancreatitis   . Renal insufficiency   . Secondary hyperparathyroidism ([facility])    [name] 05/11/2014    Past Surgical History:  Procedure Laterality Date  . ANTERIOR CERVICAL CORPECTOMY N/A 05/24/2017   Procedure: CORPECTOMY CERVICAL FIVE- CERVICAL SIX;  Surgeon: Ashok Pall, MD;  Location: [facility];  Service: Neurosurgery;  Laterality: N/A;  . ARTERIOVENOUS GRAFT PLACEMENT Right 1990's?  . ARTERIOVENOUS GRAFT PLACEMENT Left 07/2002   upper arm/notes 06/02/2010  . ARTERIOVENOUS GRAFT PLACEMENT Right 07/2003   upper arm/notes 06/02/2010  . AV FISTULA PLACEMENT Left 09/2000   upper arm/notes 06/02/2010  . BREAST BIOPSY Left unsure   benign  . COLONOSCOPY    . DILATATION & CURRETTAGE/HYSTEROSCOPY WITH RESECTOCOPE N/A 04/17/2013   Procedure: DILATATION & CURETTAGE/HYSTEROSCOPY WITH  RESECTOCOPE;  Surgeon: Marvene Staff, MD;  Location: [facility];  Service: Gynecology;  Laterality: N/A;  . DILATION AND CURETTAGE OF UTERUS    . EYE SURGERY    . KIDNEY TRANSPLANT  1997  . KIDNEY TRANSPLANT  01/21/2017   Dr. Harrington Challenger  . PARATHYROIDECTOMY  03/2000 05/12/2014   w/neck exploration & autotransplantation/notes 06/02/2010; w/neck exploration  . PARATHYROIDECTOMY N/A 05/12/2014   Procedure: PARATHYROIDECTOMY AND NECK EXPLORATION;  Surgeon: Armandina Gemma, MD;  Location: [facility];  Service: General;  Laterality: N/A;  . PERITONEAL CATHETER INSERTION    . PERITONEAL CATHETER REMOVAL  08/1999   [name] 06/02/2010  . POSTERIOR CERVICAL FUSION/FORAMINOTOMY N/A 05/30/2017   Procedure: POSTERIOR CERVICAL Arthrodesis Cervical three - four, cervical four - five, cervical five - six, cervical six - seven;  Surgeon: Ashok Pall, MD;  Location: [facility];  Service: Neurosurgery;  Laterality: N/A;  . POSTERIOR CERVICAL LAMINECTOMY N/A 08/12/2016   Procedure: POSTERIOR CERVICAL LAMINECTOMY CERVICAL THREE CERVICAL FOUR, CERVICAL FOUR CERVICAL FIVE, CERVICAL FIVE- CERVICAL SIX, CERVICAL SIX- CERVICAL SEVEN;  Surgeon: Ashok Pall, MD;  Location: [facility];  Service: Neurosurgery;  Laterality: N/A;  POSTERIOR  . RETINAL DETACHMENT SURGERY Left   . THROMBECTOMY Left 02/2002   fistula/notes 06/02/2010  . THROMBECTOMY / ARTERIOVENOUS GRAFT REVISION  11/2003; 01/2004; 08/07/2005; 08/10/2005; 10/2005   [name] 06/02/2010  . THROMBECTOMY AND REVISION OF  ARTERIOVENTOUS (AV) GORETEX  GRAFT Right 01/2002; 11/2003; 01/2004; 08/07/2004; 08/10/2004; 11/13/2005   [name] 5/1/2012Marland Kitchen [name] 5/16/2012Marland Kitchen [name] 5/16/2012Marland Kitchen [name] 5/16/2012Marland Kitchen [name] 5/16/2012Marland Kitchen [name] 06/02/2010  . THROMBECTOMY AND REVISION OF ARTERIOVENTOUS (AV) GORETEX  GRAFT Left 08/23/2002; 09/13/2002; 07/08/2003   [name] 06/02/2010; [name] 06/02/2010; [name] 06/02/2010    There were no vitals filed for this visit.  Subjective Assessment - 10/30/17 1032    Subjective  Reports  everything has been going well at home. Reports walking 4000-5000 steps this weekend and took rest breaks as needed and did not feel as tired after. Reports exercises are going well at home.     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  When she first stands up and starts walking, R foot drags    Patient Stated Goals  Focus on core strength, neck and balance    Currently in Pain?  No/denies       Skilled PT Intervention:  Patient verbalizes and demonstrates understanding of the below updated HEP exercises for LE strengthening and core stabilization with proper technique to improve functional mobility.  Exercises: Quadruped Alternating Leg Extensions - 10 reps - 3 sets - 1x daily - 5x weekly  Quadruped Fire Hydrant - 10 reps - 3 sets - 1x daily - 5x weekly  Bird Dog - 10 reps - 3 sets - 1x daily - 5x weekly      [facility] Adult PT Treatment/Exercise - 10/30/17 1250      Balance   Balance Assessed  Yes      Dynamic Standing Balance   Dynamic Standing - Comments  Pt performs x2 trials of tandem stepping with counter top support. Pt requires finger tip support on counter top 2/2 increased challenge with narrow BOS      Exercises   Exercises  Other Exercises    Other Exercises   Pt performs 2x5 reps of resisted hip extension in tall kneeling position with additional resistance band around distal thigh to increase glut med and hamstring activation for strengthening.              PT Education - 10/30/17 1253    Education Details  Therapist provided education regarding updated HEP strengthening exercises with patient  verbalizing and demonstrating understanding.     Person(s) Educated  Patient    Methods  Explanation    Comprehension  Verbalized understanding;Returned demonstration       PT Short Term Goals - 10/11/17 2142      PT SHORT TERM GOAL #1   Title  Pt will be independent with initial HEP in order to indicate improved functional mobility and decreased fall risk.     Time  4    Period  Weeks    Status  New    Target Date  11/10/17      PT SHORT TERM GOAL #2   Title  Pt will improve 5TSS to </=11 secs without UE support in order to indicate decreased fall risk and improved functional strength.      Baseline  12 seconds without use of UE    Time  4    Period  Weeks    Status  New    Target Date  11/10/17      PT SHORT TERM GOAL #3   Title  Pt will improve FGA to >/= 24/30 in order to indicate decreased fall risk.      Baseline  21/30    Time  4    Period  Weeks    Status  New    Target Date  11/10/17      PT SHORT TERM GOAL #4   Title  Pt will ambulate at gait speed of >/=3.6 ft/sec with decreased indication of R foot slap/overt gait deviations indicating safe gait speed.     Baseline  3.54 ft/sec     Time  4    Period  Weeks    Status  New    Target Date  11/10/17      PT SHORT TERM GOAL #5   Title  Pt will tolerate set up and use of Bioness functional electrical stimulation for DF assist on RLE    Time  4    Period  Weeks    Status  New    Target Date  11/10/17        PT Long Term Goals - 10/11/17 2145      PT LONG TERM GOAL #1   Title  Pt will be independent with final HEP in order to indicate improved functional mobility and decreased fall risk.     Time  8    Period  Weeks    Status  New    Target Date  12/10/17      PT LONG TERM GOAL #2   Title  Pt will improve FGA to >/=27/30 in order to indicate decreased fall risk.      Time  8    Period  Weeks    Status  New    Target Date  12/10/17      PT LONG TERM GOAL #3   Title  Pt will improve gait velocity  to >/= 3.8 ft/sec with decreased evidence of R foot drop and genu recurvatum to decrease falls risk in community    Time  8    Period  Weeks    Status  New    Target Date  12/10/17      PT LONG TERM GOAL #4   Title  Pt will ambulate >1000' over varying outdoor surfaces with decreased incidence of R foot slap/drag and increased foot clearance to indicate safer ambulation in community    Time  8    Period  Weeks    Status  New    Target Date  12/10/17      PT LONG TERM GOAL #5   Title  Pt will improve cervical rotation by 10 deg bilaterally in order to indicate improved safety with driving      Baseline  40 deg bilaterally    Time  8    Period  Weeks    Status  New    Target Date  12/10/17            Plan - 10/30/17 1254    Clinical Impression Statement  Today's session focused on dynamic standing balance and updating patient's HEP with LE strengthening and core stabilization exercises. Pt verbalizes and demonstrates understanding of updated HEP exercises with proper technique. Initially, Pt required multimodal cues for pelvic stabilization during quadriped strengthening with dynamic extremity movements. Pt demonstrates good carry over between sets with improved core stabilization and reduced pelvic drop. Pt will continue to benefit from LE strengthening and  balance exercises to improve functional mobility.     Rehab Potential  Good    Clinical Impairments Affecting Rehab Potential  severity of cord compression     PT Frequency  2x / week    PT Duration  8 weeks    PT Treatment/Interventions  ADLs/Self Care Home Management;Electrical Stimulation;Gait training;Stair training;Functional mobility training;Therapeutic activities;Therapeutic exercise;Balance training;Neuromuscular re-education;Patient/family education;Orthotic Fit/Training;Passive range of motion;Manual techniques;Aquatic Therapy;Moist Heat;Taping    PT Next Visit Plan  MD SAID NO TO DRY NEEDLING; backwards walking on  treadmill with bioness, R step length activity with beam and targets on ramp with resistance, bosu ball/ wall squats with bioness, tandem on foam, LE strengthening with special consideration to hamstrings, continue to address cervical/upper spine muscle tightness as needed (? [facility] visists at [facility] for [facility]), continue to address balance and LE strengthening, Bioness to right LE as able with session for improved muscle strengthening.     PT Home Exercise Plan  [email or web address]     Consulted and Agree with Plan of Care  Patient       Patient will benefit from skilled therapeutic intervention in order to improve the following deficits and impairments:  Abnormal gait, Decreased balance, Decreased range of motion, Decreased strength, Hypomobility, Impaired flexibility, Postural dysfunction, Difficulty walking, Impaired sensation  Visit Diagnosis: Muscle weakness (generalized)  Other lack of coordination     Problem List Patient Active Problem List   Diagnosis Date Noted  . History of GI diverticular bleed 09/11/2017  . Lower GI bleed   . Acute blood loss anemia   . Diverticulosis   . 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 Kidney transplant recipient 01/21/2017  . Anxiety 11/21/2016  . Stenosis of cervical spine with myelopathy ([facility]) 08/12/2016  . Routine general medical examination at a health care facility 06/01/2016  . Hypothyroidism 03/24/2015  . History of tachycardia 03/24/2015  . HTN (hypertension), benign 06/12/2013  . ESRD on dialysis [facility]) 06/12/2013    Floreen Comber, SPT 10/30/2017, 12:58 PM  [facility] [address] [place], [place], [address] Phone: [phone]   Fax:  [phone]  Name: HARLYNN KIMBELL MRN: [id number] Date of Birth: 03-17-1960

## 2017-10-30 NOTE — Patient Instructions (Addendum)
Access Code: [email or web address]  URL: [email or web address]  Date: 10/30/2017  Prepared by: Cameron Sprang   Exercises  Quadruped Alternating Leg Extensions - 10 reps - 3 sets - 1x daily - 5x weekly  Quadruped Fire Hydrant - 10 reps - 3 sets - 1x daily - 5x weekly  Bird Dog - 10 reps - 3 sets - 1x daily - 5x weekly  Plank on Knees - 5 reps - 2 sets - 1x daily - 7x weekly  Supine March on Foam Roll - 5 reps - 3 sets - 1x daily - 7x weekly  Seated Thoracic Lumbar Extension - 5 reps - 1 sets - 10 hold - 2x daily - 7x weekly  Supine Static Chest Stretch on Foam Roll - 2 reps - 1 sets - 60 hold - 2x daily - 7x weekly  Seated Ankle Dorsiflexion with Resistance - 5 reps - 3 sets - 2-3 hold - 1x daily - 7x weekly  Standing Heel Raise - 15 reps - 1 sets - 1x daily - 7x weekly  Toe Raises with Counter Support - 15 reps - 1 sets - 1x daily - 7x weekly  Standing Romberg to 1/2 Tandem Stance - 3 reps - 1 sets - 20 hold - 1x daily - 7x weekly

## 2017-11-01 DIAGNOSIS — M542 Cervicalgia: Secondary | ICD-10-CM | POA: Diagnosis not present

## 2017-11-01 DIAGNOSIS — Z5181 Encounter for therapeutic drug level monitoring: Secondary | ICD-10-CM | POA: Diagnosis not present

## 2017-11-01 DIAGNOSIS — Z87891 Personal history of nicotine dependence: Secondary | ICD-10-CM | POA: Diagnosis not present

## 2017-11-01 DIAGNOSIS — Z7952 Long term (current) use of systemic steroids: Secondary | ICD-10-CM | POA: Diagnosis not present

## 2017-11-01 DIAGNOSIS — Z4822 Encounter for aftercare following kidney transplant: Secondary | ICD-10-CM | POA: Diagnosis not present

## 2017-11-01 DIAGNOSIS — I471 Supraventricular tachycardia: Secondary | ICD-10-CM | POA: Diagnosis not present

## 2017-11-01 DIAGNOSIS — I10 Essential (primary) hypertension: Secondary | ICD-10-CM | POA: Diagnosis not present

## 2017-11-01 DIAGNOSIS — F17200 Nicotine dependence, unspecified, uncomplicated: Secondary | ICD-10-CM | POA: Diagnosis not present

## 2017-11-01 DIAGNOSIS — Z79899 Other long term (current) drug therapy: Secondary | ICD-10-CM | POA: Diagnosis not present

## 2017-11-01 DIAGNOSIS — B259 Cytomegaloviral disease, unspecified: Secondary | ICD-10-CM | POA: Diagnosis not present

## 2017-11-01 DIAGNOSIS — J449 Chronic obstructive pulmonary disease, unspecified: Secondary | ICD-10-CM | POA: Diagnosis not present

## 2017-11-01 DIAGNOSIS — N269 Renal sclerosis, unspecified: Secondary | ICD-10-CM | POA: Diagnosis not present

## 2017-11-01 DIAGNOSIS — D649 Anemia, unspecified: Secondary | ICD-10-CM | POA: Diagnosis not present

## 2017-11-01 DIAGNOSIS — D631 Anemia in chronic kidney disease: Secondary | ICD-10-CM | POA: Diagnosis not present

## 2017-11-01 DIAGNOSIS — E872 Acidosis: Secondary | ICD-10-CM | POA: Diagnosis not present

## 2017-11-01 DIAGNOSIS — R000 Tachycardia, unspecified: Secondary | ICD-10-CM | POA: Diagnosis not present

## 2017-11-01 DIAGNOSIS — N261 Atrophy of kidney (terminal): Secondary | ICD-10-CM | POA: Diagnosis not present

## 2017-11-01 DIAGNOSIS — Z940 Kidney transplant status: Secondary | ICD-10-CM | POA: Diagnosis not present

## 2017-11-01 DIAGNOSIS — K922 Gastrointestinal hemorrhage, unspecified: Secondary | ICD-10-CM | POA: Diagnosis not present

## 2017-11-02 ENCOUNTER — Encounter: Payer: Self-pay | Admitting: Rehabilitative and Restorative Service Providers"

## 2017-11-02 ENCOUNTER — Ambulatory Visit: Payer: Medicare Other | Admitting: Rehabilitative and Restorative Service Providers"

## 2017-11-02 DIAGNOSIS — R278 Other lack of coordination: Secondary | ICD-10-CM

## 2017-11-02 DIAGNOSIS — M6281 Muscle weakness (generalized): Secondary | ICD-10-CM

## 2017-11-02 DIAGNOSIS — M21371 Foot drop, right foot: Secondary | ICD-10-CM

## 2017-11-02 DIAGNOSIS — R2689 Other abnormalities of gait and mobility: Secondary | ICD-10-CM | POA: Diagnosis not present

## 2017-11-02 DIAGNOSIS — M542 Cervicalgia: Secondary | ICD-10-CM

## 2017-11-02 DIAGNOSIS — R2681 Unsteadiness on feet: Secondary | ICD-10-CM

## 2017-11-02 NOTE — Therapy (Signed)
[facility] [address] [place], [place], [address] Phone: [phone]   Fax:  [phone]  Physical Therapy Treatment  Patient Details  Name: [name] MRN: [id number] Date of Birth: November 10, 1960 Referring Provider (PT): Hoyt Koch, MD   Encounter Date: 11/02/2017  PT End of Session - 11/02/17 1131    Visit Number  8    Number of Visits  17    Date for PT Re-Evaluation  12/10/17    Authorization Type  Medicare - 10th visit PN    PT Start Time  5366    PT Stop Time  4403    PT Time Calculation (min)  39 min    Activity Tolerance  Patient tolerated treatment well;No increased pain    Behavior During Therapy  WFL for tasks assessed/performed       Past Medical History:  Diagnosis Date  . Acute blood loss anemia   . Anemia   . Anxiety   . Arthritis   . Claustrophobia   . Diverticulosis   . ESRD (end stage renal disease) on dialysis ([place])    "TTS; [place]" (03/24/2015)  . GI bleed   . Glomerulonephritis   . Heart murmur   . HTN (hypertension)   . Hypothyroidism   . Kidney transplant recipient 01/21/2017  . Pancreatitis   . Renal insufficiency   . Secondary hyperparathyroidism ([place])    [name] 05/11/2014    Past Surgical History:  Procedure Laterality Date  . ANTERIOR CERVICAL CORPECTOMY N/A 05/24/2017   Procedure: CORPECTOMY CERVICAL FIVE- CERVICAL SIX;  Surgeon: Ashok Pall, MD;  Location: [facility];  Service: Neurosurgery;  Laterality: N/A;  . ARTERIOVENOUS GRAFT PLACEMENT Right 1990's?  . ARTERIOVENOUS GRAFT PLACEMENT Left 07/2002   upper arm/notes 06/02/2010  . ARTERIOVENOUS GRAFT PLACEMENT Right 07/2003   upper arm/notes 06/02/2010  . AV FISTULA PLACEMENT Left 09/2000   upper arm/notes 06/02/2010  . BREAST BIOPSY Left unsure   benign  . COLONOSCOPY    . DILATATION & CURRETTAGE/HYSTEROSCOPY WITH RESECTOCOPE N/A 04/17/2013   Procedure: DILATATION & CURETTAGE/HYSTEROSCOPY WITH  RESECTOCOPE;  Surgeon: Marvene Staff, MD;  Location: [facility];  Service: Gynecology;  Laterality: N/A;  . DILATION AND CURETTAGE OF UTERUS    . EYE SURGERY    . KIDNEY TRANSPLANT  1997  . KIDNEY TRANSPLANT  01/21/2017   Dr. Harrington Challenger  . PARATHYROIDECTOMY  03/2000 05/12/2014   w/neck exploration & autotransplantation/notes 06/02/2010; w/neck exploration  . PARATHYROIDECTOMY N/A 05/12/2014   Procedure: PARATHYROIDECTOMY AND NECK EXPLORATION;  Surgeon: Armandina Gemma, MD;  Location: [facility];  Service: General;  Laterality: N/A;  . PERITONEAL CATHETER INSERTION    . PERITONEAL CATHETER REMOVAL  08/1999   [name] 06/02/2010  . POSTERIOR CERVICAL FUSION/FORAMINOTOMY N/A 05/30/2017   Procedure: POSTERIOR CERVICAL Arthrodesis Cervical three - four, cervical four - five, cervical five - six, cervical six - seven;  Surgeon: Ashok Pall, MD;  Location: [facility];  Service: Neurosurgery;  Laterality: N/A;  . POSTERIOR CERVICAL LAMINECTOMY N/A 08/12/2016   Procedure: POSTERIOR CERVICAL LAMINECTOMY CERVICAL THREE CERVICAL FOUR, CERVICAL FOUR CERVICAL FIVE, CERVICAL FIVE- CERVICAL SIX, CERVICAL SIX- CERVICAL SEVEN;  Surgeon: Ashok Pall, MD;  Location: [facility];  Service: Neurosurgery;  Laterality: N/A;  POSTERIOR  . RETINAL DETACHMENT SURGERY Left   . THROMBECTOMY Left 02/2002   fistula/notes 06/02/2010  . THROMBECTOMY / ARTERIOVENOUS GRAFT REVISION  11/2003; 01/2004; 08/07/2005; 08/10/2005; 10/2005   [name] 06/02/2010  . THROMBECTOMY AND REVISION OF  ARTERIOVENTOUS (AV) GORETEX  GRAFT Right 01/2002; 11/2003; 01/2004; 08/07/2004; 08/10/2004; 11/13/2005   [name] 5/1/2012Marland Kitchen [name] 5/16/2012Marland Kitchen [name] 5/16/2012Marland Kitchen [name] 5/16/2012Marland Kitchen [name] 5/16/2012Marland Kitchen [name] 06/02/2010  . THROMBECTOMY AND REVISION OF ARTERIOVENTOUS (AV) GORETEX  GRAFT Left 08/23/2002; 09/13/2002; 07/08/2003   [name] 06/02/2010; [name] 06/02/2010; [name] 06/02/2010    There were no vitals filed for this visit.  Subjective Assessment - 11/02/17 0856    Subjective  The patient  reports that she has neck stiffness, no pain.  She reports the right leg catches at times and she has to think about each step.    Pertinent History  lower GI bleed due to diverticulitis, pancreatitis, heart murmur, hypothyroidism, HTN, stenosis of cervical spine with myelopathy with posterior fusion/foraminotomy, anterior cervical corpectomy, posterior cervical laminectomy, parathyroidectomy, ESRD with kidney transplant in January of 2019, anxiety, and anemia    Patient Stated Goals  Focus on core strength, neck and balance    Currently in Pain?  No/denies                       [facility] Adult PT Treatment/Exercise - 11/02/17 0910      Ambulation/Gait   Ambulation/Gait  Yes    Ambulation/Gait Assistance  5: Supervision    Ambulation/Gait Assistance Details  PT lengthened the ramp down for bioness e-stim atheel strike to reduce foot slap during gait activities.  Ambulated x 500 feet x 2 reps with Bioness.  Performed backwards walking on ground with bioness donned.  Also performed gait with R hip resistance for anterior translation.  *patient with R hip instability during heel strike to mid stance.    Assistive device  None    Gait Pattern  Step-through pattern;Poor foot clearance - right;Decreased hip/knee flexion - right;Decreased hip/knee flexion - left;Decreased dorsiflexion - right;Decreased dorsiflexion - left;Decreased stance time - right;Decreased weight shift to right    Ambulation Surface  Level;Indoor      Neuro Re-ed    Neuro Re-ed Details   Standing near countertop in right leg stance:  1) performed right weight shift to tap countertop with lateral hip and then return to midline in R stance.  Provided cues for hip stability.  2) Standing on small wooden wedge to stretch heel cord in R leg stance performing anterior hip sway while keeping hips level.  3) Added Bioness activation during R leg loading on solid ground and on wooden incline for muscle contraction for ankle  control.  Standing R knee terminal stance during R leg stance activities.        Exercises   Exercises  Other Exercises    Other Exercises   Seated hamstring stretch and heel cord stretch, long sitting bilateal hamstring stretch, seated butterfly stretch, seated piriformis stretch bilaterally x 30 seconds x 2 reps each side.      Modalities   Modalities  Teacher, English as a foreign language Location  Bioness lower cuff to promote R tibialis anterior activation to prevent R foot drop     Electrical Stimulation Action  Tibialis anterior activation    Electrical Stimulation Parameters  Began at 26 medial and 10 lateral    Electrical Stimulation Goals  Neuromuscular facilitation;Strength             PT Education - 11/02/17 1130    Education Details  added hamstring/heel cord stretch, *need to review HEP as patient reported she cannot safely do all of the prescribed ones (needs family assist)  Person(s) Educated  Patient    Methods  Explanation;Demonstration;Handout    Comprehension  Verbalized understanding;Returned demonstration       PT Short Term Goals - 11/02/17 1132      PT SHORT TERM GOAL #1   Title  Pt will be independent with initial HEP in order to indicate improved functional mobility and decreased fall risk.     Time  4    Period  Weeks    Status  New      PT SHORT TERM GOAL #2   Title  Pt will improve 5TSS to </=11 secs without UE support in order to indicate decreased fall risk and improved functional strength.      Baseline  12 seconds without use of UE    Time  4    Period  Weeks    Status  New      PT SHORT TERM GOAL #3   Title  Pt will improve FGA to >/= 24/30 in order to indicate decreased fall risk.      Baseline  21/30    Time  4    Period  Weeks    Status  New      PT SHORT TERM GOAL #4   Title  Pt will ambulate at gait speed of >/=3.6 ft/sec with decreased indication of R foot slap/overt gait deviations  indicating safe gait speed.     Baseline  3.54 ft/sec     Time  4    Period  Weeks    Status  New      PT SHORT TERM GOAL #5   Title  Pt will tolerate set up and use of Bioness functional electrical stimulation for DF assist on RLE    Baseline  Bioness set up and patient can tolerate at low intensity    Time  4    Period  Weeks    Status  Achieved        PT Long Term Goals - 10/11/17 2145      PT LONG TERM GOAL #1   Title  Pt will be independent with final HEP in order to indicate improved functional mobility and decreased fall risk.     Time  8    Period  Weeks    Status  New    Target Date  12/10/17      PT LONG TERM GOAL #2   Title  Pt will improve FGA to >/=27/30 in order to indicate decreased fall risk.      Time  8    Period  Weeks    Status  New    Target Date  12/10/17      PT LONG TERM GOAL #3   Title  Pt will improve gait velocity to >/= 3.8 ft/sec with decreased evidence of R foot drop and genu recurvatum to decrease falls risk in community    Time  8    Period  Weeks    Status  New    Target Date  12/10/17      PT LONG TERM GOAL #4   Title  Pt will ambulate >1000' over varying outdoor surfaces with decreased incidence of R foot slap/drag and increased foot clearance to indicate safer ambulation in community    Time  8    Period  Weeks    Status  New    Target Date  12/10/17      PT LONG TERM GOAL #5   Title  Pt will  improve cervical rotation by 10 deg bilaterally in order to indicate improved safety with driving      Baseline  40 deg bilaterally    Time  8    Period  Weeks    Status  New    Target Date  12/10/17            Plan - 11/02/17 1137    Clinical Impression Statement  The patient is tolerating Bioness, however notes "prickly" sensation and tolerates less intensity today than at last session (PT started slow and ramped up to tolerance).  The patient notes consistent tightness in R hip, which may be due to compenstaory strategies during  R stance phase of gait.  PT to continue working to [employer].     PT Treatment/Interventions  ADLs/Self Care Home Management;Electrical Stimulation;Gait training;Stair training;Functional mobility training;Therapeutic activities;Therapeutic exercise;Balance training;Neuromuscular re-education;Patient/family education;Orthotic Fit/Training;Passive range of motion;Manual techniques;Aquatic Therapy;Moist Heat;Taping    PT Next Visit Plan  (MD SAID NO TO DRY NEEDLING) backwards walking on treadmill with bioness, R step length activity with beam and targets on ramp with resistance, bosu ball/ wall squats with bioness, tandem on foam, LE strengthening with special consideration to hamstrings, continue to address cervical/upper spine muscle tightness as needed (? couple visists at [facility]), continue to address balance and LE strengthening, Bioness to right LE as able with session for improved muscle strengthening.     PT Home Exercise Plan  [id number]     Consulted and Agree with Plan of Care  Patient       Patient will benefit from skilled therapeutic intervention in order to improve the following deficits and impairments:  Abnormal gait, Decreased balance, Decreased range of motion, Decreased strength, Hypomobility, Impaired flexibility, Postural dysfunction, Difficulty walking, Impaired sensation  Visit Diagnosis: Muscle weakness (generalized)  Other lack of coordination  Unsteadiness on feet  Other abnormalities of gait and mobility  Foot drop, right  Cervicalgia     Problem List Patient Active Problem List   Diagnosis Date Noted  . History of GI diverticular bleed 09/11/2017  . Lower GI bleed   . Acute blood loss anemia   . Diverticulosis   . 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 Kidney transplant recipient 01/21/2017  . Anxiety 11/21/2016  . Stenosis of cervical spine with myelopathy ([place]) 08/12/2016  . Routine general medical examination at a health care facility 06/01/2016  .  Hypothyroidism 03/24/2015  . History of tachycardia 03/24/2015  . HTN (hypertension), benign 06/12/2013  . ESRD on dialysis [facility]) 06/12/2013    [place], PT 11/02/2017, 11:39 AM  [facility] [address] [place], [place], [address] Phone: [phone]   Fax:  [phone]  Name: [name] MRN: [id number] Date of Birth: May 12, 1960

## 2017-11-02 NOTE — Patient Instructions (Signed)
[id number]

## 2017-11-06 ENCOUNTER — Ambulatory Visit: Payer: Medicare Other | Admitting: Rehabilitative and Restorative Service Providers"

## 2017-11-06 ENCOUNTER — Encounter: Payer: Self-pay | Admitting: Rehabilitative and Restorative Service Providers"

## 2017-11-06 DIAGNOSIS — R2689 Other abnormalities of gait and mobility: Secondary | ICD-10-CM | POA: Diagnosis not present

## 2017-11-06 DIAGNOSIS — R278 Other lack of coordination: Secondary | ICD-10-CM | POA: Diagnosis not present

## 2017-11-06 DIAGNOSIS — R2681 Unsteadiness on feet: Secondary | ICD-10-CM | POA: Diagnosis not present

## 2017-11-06 DIAGNOSIS — M6281 Muscle weakness (generalized): Secondary | ICD-10-CM | POA: Diagnosis not present

## 2017-11-06 DIAGNOSIS — M542 Cervicalgia: Secondary | ICD-10-CM

## 2017-11-06 DIAGNOSIS — M21371 Foot drop, right foot: Secondary | ICD-10-CM | POA: Diagnosis not present

## 2017-11-06 NOTE — Therapy (Signed)
[facility] [address] [place], [place], [address] Phone: [phone]   Fax:  [phone]  Physical Therapy Treatment  Patient Details  Name: [name] MRN: [id number] Date of Birth: 15-Jul-1960 Referring Provider (PT): Hoyt Koch, MD   Encounter Date: 11/06/2017  PT End of Session - 11/06/17 2222    Visit Number  9    Number of Visits  17    Date for PT Re-Evaluation  12/10/17    Authorization Type  Medicare - 10th visit PN    PT Start Time  0933    PT Stop Time  4097    PT Time Calculation (min)  42 min    Activity Tolerance  Patient tolerated treatment well;No increased pain    Behavior During Therapy  WFL for tasks assessed/performed       Past Medical History:  Diagnosis Date  . Acute blood loss anemia   . Anemia   . Anxiety   . Arthritis   . Claustrophobia   . Diverticulosis   . ESRD (end stage renal disease) on dialysis ([place])    "TTS; [place]" (03/24/2015)  . GI bleed   . Glomerulonephritis   . Heart murmur   . HTN (hypertension)   . Hypothyroidism   . Kidney transplant recipient 01/21/2017  . Pancreatitis   . Renal insufficiency   . Secondary hyperparathyroidism ([place])    [name] 05/11/2014    Past Surgical History:  Procedure Laterality Date  . ANTERIOR CERVICAL CORPECTOMY N/A 05/24/2017   Procedure: CORPECTOMY CERVICAL FIVE- CERVICAL SIX;  Surgeon: Ashok Pall, MD;  Location: [facility];  Service: Neurosurgery;  Laterality: N/A;  . ARTERIOVENOUS GRAFT PLACEMENT Right 1990's?  . ARTERIOVENOUS GRAFT PLACEMENT Left 07/2002   upper arm/notes 06/02/2010  . ARTERIOVENOUS GRAFT PLACEMENT Right 07/2003   upper arm/notes 06/02/2010  . AV FISTULA PLACEMENT Left 09/2000   upper arm/notes 06/02/2010  . BREAST BIOPSY Left unsure   benign  . COLONOSCOPY    . DILATATION & CURRETTAGE/HYSTEROSCOPY WITH RESECTOCOPE N/A 04/17/2013   Procedure: DILATATION & CURETTAGE/HYSTEROSCOPY WITH  RESECTOCOPE;  Surgeon: Marvene Staff, MD;  Location: [facility];  Service: Gynecology;  Laterality: N/A;  . DILATION AND CURETTAGE OF UTERUS    . EYE SURGERY    . KIDNEY TRANSPLANT  1997  . KIDNEY TRANSPLANT  01/21/2017   Dr. Harrington Challenger  . PARATHYROIDECTOMY  03/2000 05/12/2014   w/neck exploration & autotransplantation/notes 06/02/2010; w/neck exploration  . PARATHYROIDECTOMY N/A 05/12/2014   Procedure: PARATHYROIDECTOMY AND NECK EXPLORATION;  Surgeon: Armandina Gemma, MD;  Location: [facility];  Service: General;  Laterality: N/A;  . PERITONEAL CATHETER INSERTION    . PERITONEAL CATHETER REMOVAL  08/1999   [name] 06/02/2010  . POSTERIOR CERVICAL FUSION/FORAMINOTOMY N/A 05/30/2017   Procedure: POSTERIOR CERVICAL Arthrodesis Cervical three - four, cervical four - five, cervical five - six, cervical six - seven;  Surgeon: Ashok Pall, MD;  Location: [facility];  Service: Neurosurgery;  Laterality: N/A;  . POSTERIOR CERVICAL LAMINECTOMY N/A 08/12/2016   Procedure: POSTERIOR CERVICAL LAMINECTOMY CERVICAL THREE CERVICAL FOUR, CERVICAL FOUR CERVICAL FIVE, CERVICAL FIVE- CERVICAL SIX, CERVICAL SIX- CERVICAL SEVEN;  Surgeon: Ashok Pall, MD;  Location: [facility];  Service: Neurosurgery;  Laterality: N/A;  POSTERIOR  . RETINAL DETACHMENT SURGERY Left   . THROMBECTOMY Left 02/2002   fistula/notes 06/02/2010  . THROMBECTOMY / ARTERIOVENOUS GRAFT REVISION  11/2003; 01/2004; 08/07/2005; 08/10/2005; 10/2005   [name] 06/02/2010  . THROMBECTOMY AND REVISION OF  ARTERIOVENTOUS (AV) GORETEX  GRAFT Right 01/2002; 11/2003; 01/2004; 08/07/2004; 08/10/2004; 11/13/2005   [name] 5/1/2012Marland Kitchen [name] 5/16/2012Marland Kitchen [name] 5/16/2012Marland Kitchen [name] 5/16/2012Marland Kitchen [name] 5/16/2012Marland Kitchen [name] 06/02/2010  . THROMBECTOMY AND REVISION OF ARTERIOVENTOUS (AV) GORETEX  GRAFT Left 08/23/2002; 09/13/2002; 07/08/2003   [name] 06/02/2010; [name] 06/02/2010; [name] 06/02/2010    There were no vitals filed for this visit.  Subjective Assessment - 11/06/17 0935    Subjective  The patient  reports the stretching last session felt good.  She notes her hip continues to feel weak when walking.    Pertinent History  lower GI bleed due to diverticulitis, pancreatitis, heart murmur, hypothyroidism, HTN, stenosis of cervical spine with myelopathy with posterior fusion/foraminotomy, anterior cervical corpectomy, posterior cervical laminectomy, parathyroidectomy, ESRD with kidney transplant in January of 2019, anxiety, and anemia    Patient Stated Goals  Focus on core strength, neck and balance    Currently in Pain?  No/denies                       [facility] Adult PT Treatment/Exercise - 11/06/17 1002      Ambulation/Gait   Ambulation/Gait  Yes    Ambulation/Gait Assistance  5: Supervision    Ambulation/Gait Assistance Details  the patient has R hip lateral instability noted during mid stance phase of gait    Ambulation Distance (Feet)  230 Feet   x 2   Assistive device  None    Gait Pattern  Step-through pattern;Poor foot clearance - right;Decreased hip/knee flexion - right;Decreased hip/knee flexion - left;Decreased dorsiflexion - right;Decreased dorsiflexion - left;Decreased stance time - right;Decreased weight shift to right    Ambulation Surface  Level;Indoor      Neuro Re-ed    Neuro Re-ed Details   Tandem gait on compliant foam dec'ing UE support with cues for ankle stability.  R leg stance reaching to cone with L LE while on incline with min A.        Exercises   Exercises  Knee/Hip;Ankle    Other Exercises   Standing squat "monster" walking in // bars with red theraband forward and backwards, right single leg stance with L hip ER x 10 reps with i ntermittent UE support.  Lunge with isometric holds (warrior 2) right and left sides x 3 reps each.        Knee/Hip Exercises: Stretches   Active Hamstring Stretch  Right;Left;1 rep;30 seconds    Piriformis Stretch  Right;Left;1 rep;30 seconds    Other Knee/Hip Stretches  Long sitting with bilateral hamstring stretch  and core activation;   seated knee to chest + trunk rotation right and left sides      Ankle Exercises: Standing   Heel Raises  Both;10 reps    Heel Raises Limitations  on 2" block with heels hovering off ground and performing mini squats dec'ing UE support.    Toe Raise  10 reps    Toe Raise Limitations  off anterior edge of 2" block with 5 second holds               PT Short Term Goals - 11/06/17 2223      PT SHORT TERM GOAL #1   Title  Pt will be independent with initial HEP in order to indicate improved functional mobility and decreased fall risk.     Time  4    Period  Weeks    Status  New    Target Date  11/10/17      PT  SHORT TERM GOAL #2   Title  Pt will improve 5TSS to </=11 secs without UE support in order to indicate decreased fall risk and improved functional strength.      Baseline  12 seconds without use of UE    Time  4    Period  Weeks    Status  New      PT SHORT TERM GOAL #3   Title  Pt will improve FGA to >/= 24/30 in order to indicate decreased fall risk.      Baseline  21/30    Time  4    Period  Weeks    Status  New      PT SHORT TERM GOAL #4   Title  Pt will ambulate at gait speed of >/=3.6 ft/sec with decreased indication of R foot slap/overt gait deviations indicating safe gait speed.     Baseline  3.54 ft/sec     Time  4    Period  Weeks    Status  New      PT SHORT TERM GOAL #5   Title  Pt will tolerate set up and use of Bioness functional electrical stimulation for DF assist on RLE    Baseline  Bioness set up and patient can tolerate at low intensity    Time  4    Period  Weeks    Status  Achieved        PT Long Term Goals - 10/11/17 2145      PT LONG TERM GOAL #1   Title  Pt will be independent with final HEP in order to indicate improved functional mobility and decreased fall risk.     Time  8    Period  Weeks    Status  New    Target Date  12/10/17      PT LONG TERM GOAL #2   Title  Pt will improve FGA to >/=27/30 in  order to indicate decreased fall risk.      Time  8    Period  Weeks    Status  New    Target Date  12/10/17      PT LONG TERM GOAL #3   Title  Pt will improve gait velocity to >/= 3.8 ft/sec with decreased evidence of R foot drop and genu recurvatum to decrease falls risk in community    Time  8    Period  Weeks    Status  New    Target Date  12/10/17      PT LONG TERM GOAL #4   Title  Pt will ambulate >1000' over varying outdoor surfaces with decreased incidence of R foot slap/drag and increased foot clearance to indicate safer ambulation in community    Time  8    Period  Weeks    Status  New    Target Date  12/10/17      PT LONG TERM GOAL #5   Title  Pt will improve cervical rotation by 10 deg bilaterally in order to indicate improved safety with driving      Baseline  40 deg bilaterally    Time  8    Period  Weeks    Status  New    Target Date  12/10/17            Plan - 11/06/17 1014    Clinical Impression Statement  The patient notes some improvement with ankle DF after using Bioness.  She c/o continued weakness  in R hip with prolonged gait.  Today's session emphasizing R hip stabilization activities, LE strengthening, balance, and flexibility.  PT to continue working to [employer].    PT Treatment/Interventions  ADLs/Self Care Home Management;Electrical Stimulation;Gait training;Stair training;Functional mobility training;Therapeutic activities;Therapeutic exercise;Balance training;Neuromuscular re-education;Patient/family education;Orthotic Fit/Training;Passive range of motion;Manual techniques;Aquatic Therapy;Moist Heat;Taping    PT Next Visit Plan  *Patient needs some adjustment to HEP -- can't do by herself at home*         (MD SAID NO TO DRY NEEDLING) backwards walking on treadmill with bioness, R step length activity with beam and targets on ramp with resistance, bosu ball/ wall squats with bioness, tandem on foam, LE strengthening with special consideration to  hamstrings, continue to address cervical/upper spine muscle tightness as needed (? couple visists at [facility] for [facility]), continue to address balance and LE strengthening, Bioness to right LE as able with session for improved muscle strengthening.     PT Home Exercise Plan  [id number]     Consulted and Agree with Plan of Care  Patient       Patient will benefit from skilled therapeutic intervention in order to improve the following deficits and impairments:  Abnormal gait, Decreased balance, Decreased range of motion, Decreased strength, Hypomobility, Impaired flexibility, Postural dysfunction, Difficulty walking, Impaired sensation  Visit Diagnosis: Muscle weakness (generalized)  Unsteadiness on feet  Other abnormalities of gait and mobility  Cervicalgia  Foot drop, right     Problem List Patient Active Problem List   Diagnosis Date Noted  . History of GI diverticular bleed 09/11/2017  . Lower GI bleed   . Acute blood loss anemia   . Diverticulosis   . 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 Kidney transplant recipient 01/21/2017  . Anxiety 11/21/2016  . Stenosis of cervical spine with myelopathy ([place]) 08/12/2016  . Routine general medical examination at a health care facility 06/01/2016  . Hypothyroidism 03/24/2015  . History of tachycardia 03/24/2015  . HTN (hypertension), benign 06/12/2013  . ESRD on dialysis [facility]) 06/12/2013    [place], PT 11/06/2017, 10:39 PM  [facility] [address] [place], [place], [address] Phone: [phone]   Fax:  [phone]  Name: SILENA WYSS MRN: [id number] Date of Birth: 31-Oct-1960

## 2017-11-08 ENCOUNTER — Encounter: Payer: Self-pay | Admitting: Physical Therapy

## 2017-11-08 ENCOUNTER — Ambulatory Visit: Payer: Medicare Other | Admitting: Physical Therapy

## 2017-11-08 DIAGNOSIS — M21371 Foot drop, right foot: Secondary | ICD-10-CM | POA: Diagnosis not present

## 2017-11-08 DIAGNOSIS — R2681 Unsteadiness on feet: Secondary | ICD-10-CM | POA: Diagnosis not present

## 2017-11-08 DIAGNOSIS — R2689 Other abnormalities of gait and mobility: Secondary | ICD-10-CM | POA: Diagnosis not present

## 2017-11-08 DIAGNOSIS — M6281 Muscle weakness (generalized): Secondary | ICD-10-CM

## 2017-11-08 DIAGNOSIS — M542 Cervicalgia: Secondary | ICD-10-CM | POA: Diagnosis not present

## 2017-11-08 DIAGNOSIS — R278 Other lack of coordination: Secondary | ICD-10-CM | POA: Diagnosis not present

## 2017-11-08 NOTE — Patient Instructions (Signed)
Access Code: [email or web address]  URL: [email or web address]  Date: 11/08/2017  Prepared by: Willow Ora   Exercises  Quadruped Alternating Leg Extensions - 10 reps - 3 sets - 1x daily - 5x weekly  Quadruped Fire Hydrant - 10 reps - 3 sets - 1x daily - 5x weekly  Plank on Knees - 3-5 reps - 2 sets - 10 hold - 1x daily - 7x weekly  Seated Thoracic Lumbar Extension - 5 reps - 1 sets - 10 hold - 2x daily - 7x weekly  Supine Static Chest Stretch on Foam Roll - 2 reps - 1 sets - 60 hold - 2x daily - 7x weekly  Seated Ankle Dorsiflexion with Resistance - 5 reps - 3 sets - 2-3 hold - 1x daily - 7x weekly  Standing Heel Raise - 15 reps - 1 sets - 1x daily - 7x weekly  Romberg Stance Eyes Closed on Foam Pad - 3 reps - 1 sets - 30 hold - 1x daily - 5x weekly  Toe Raises with Counter Support - 15 reps - 1 sets - 1x daily - 7x weekly  Narrow Stance with Eyes Closed and Head Rotation on Foam Pad - 10 reps - 1 sets - 1x daily - 5x weekly  Long Sitting Calf Stretch with Strap - 3 reps - 1 sets - 30 hold - 1-2x daily - 7x weekly  Narrow Stance with Eyes Closed and Head Nods on Foam Pad - 10 reps - 1 sets - 1x daily - 5x weekly

## 2017-11-08 NOTE — Therapy (Addendum)
[facility] [address] [place], [place], [address] Phone: [phone]   Fax:  [phone]  Physical Therapy Treatment and Progress Note  Patient Details  Name: [name] MRN: [id number] Date of Birth: June 25, 1960 Referring Provider (PT): Hoyt Koch, MD   Encounter Date: 11/08/2017  PT End of Session - 11/08/17 0938    Visit Number  10    Number of Visits  17    Date for PT Re-Evaluation  12/10/17    Authorization Type  Medicare - 10th visit PN    PT Start Time  0933    PT Stop Time  1013    PT Time Calculation (min)  40 min    Activity Tolerance  Patient tolerated treatment well;No increased pain    Behavior During Therapy  WFL for tasks assessed/performed       Past Medical History:  Diagnosis Date  . Acute blood loss anemia   . Anemia   . Anxiety   . Arthritis   . Claustrophobia   . Diverticulosis   . ESRD (end stage renal disease) on dialysis ([place])    "TTS; [facility]" (03/24/2015)  . GI bleed   . Glomerulonephritis   . Heart murmur   . HTN (hypertension)   . Hypothyroidism   . Kidney transplant recipient 01/21/2017  . Pancreatitis   . Renal insufficiency   . Secondary hyperparathyroidism ([place])    [name] 05/11/2014    Past Surgical History:  Procedure Laterality Date  . ANTERIOR CERVICAL CORPECTOMY N/A 05/24/2017   Procedure: CORPECTOMY CERVICAL FIVE- CERVICAL SIX;  Surgeon: Ashok Pall, MD;  Location: [facility];  Service: Neurosurgery;  Laterality: N/A;  . ARTERIOVENOUS GRAFT PLACEMENT Right 1990's?  . ARTERIOVENOUS GRAFT PLACEMENT Left 07/2002   upper arm/notes 06/02/2010  . ARTERIOVENOUS GRAFT PLACEMENT Right 07/2003   upper arm/notes 06/02/2010  . AV FISTULA PLACEMENT Left 09/2000   upper arm/notes 06/02/2010  . BREAST BIOPSY Left unsure   benign  . COLONOSCOPY    . DILATATION & CURRETTAGE/HYSTEROSCOPY WITH RESECTOCOPE N/A 04/17/2013   Procedure: DILATATION &  CURETTAGE/HYSTEROSCOPY WITH RESECTOCOPE;  Surgeon: Marvene Staff, MD;  Location: [facility];  Service: Gynecology;  Laterality: N/A;  . DILATION AND CURETTAGE OF UTERUS    . EYE SURGERY    . KIDNEY TRANSPLANT  1997  . KIDNEY TRANSPLANT  01/21/2017   Dr. Harrington Challenger  . PARATHYROIDECTOMY  03/2000 05/12/2014   w/neck exploration & autotransplantation/notes 06/02/2010; w/neck exploration  . PARATHYROIDECTOMY N/A 05/12/2014   Procedure: PARATHYROIDECTOMY AND NECK EXPLORATION;  Surgeon: Armandina Gemma, MD;  Location: [facility];  Service: General;  Laterality: N/A;  . PERITONEAL CATHETER INSERTION    . PERITONEAL CATHETER REMOVAL  08/1999   [name] 06/02/2010  . POSTERIOR CERVICAL FUSION/FORAMINOTOMY N/A 05/30/2017   Procedure: POSTERIOR CERVICAL Arthrodesis Cervical three - four, cervical four - five, cervical five - six, cervical six - seven;  Surgeon: Ashok Pall, MD;  Location: [facility];  Service: Neurosurgery;  Laterality: N/A;  . POSTERIOR CERVICAL LAMINECTOMY N/A 08/12/2016   Procedure: POSTERIOR CERVICAL LAMINECTOMY CERVICAL THREE CERVICAL FOUR, CERVICAL FOUR CERVICAL FIVE, CERVICAL FIVE- CERVICAL SIX, CERVICAL SIX- CERVICAL SEVEN;  Surgeon: Ashok Pall, MD;  Location: [facility];  Service: Neurosurgery;  Laterality: N/A;  POSTERIOR  . RETINAL DETACHMENT SURGERY Left   . THROMBECTOMY Left 02/2002   fistula/notes 06/02/2010  . THROMBECTOMY / ARTERIOVENOUS GRAFT REVISION  11/2003; 01/2004; 08/07/2005; 08/10/2005; 10/2005   [name] 06/02/2010  . THROMBECTOMY  AND REVISION OF ARTERIOVENTOUS (AV) GORETEX  GRAFT Right 01/2002; 11/2003; 01/2004; 08/07/2004; 08/10/2004; 11/13/2005   [name] 5/1/2012Marland Kitchen [name] 5/16/2012Marland Kitchen [name] 5/16/2012Marland Kitchen [name] 5/16/2012Marland Kitchen [name] 06/02/2010; [name] 06/02/2010  . THROMBECTOMY AND REVISION OF ARTERIOVENTOUS (AV) GORETEX  GRAFT Left 08/23/2002; 09/13/2002; 07/08/2003   [name] 06/02/2010; [name] 06/02/2010; [name] 06/02/2010    There were no vitals filed for this visit.  Subjective Assessment - 11/08/17 0936     Subjective  No new complaints. No falls or pain to report. Does continue report tighness. Tired after last session.     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  When she first stands up and starts walking, R foot drags    Patient Stated Goals  Focus on core strength, neck and balance    Currently in Pain?  No/denies    Pain Score  0-No pain          [facility] Adult PT Treatment/Exercise - 11/08/17 1012      Exercises   Exercises  Other Exercises    Other Exercises   Reviewed current ex's on program with pt performing them in session and advanced HEP wtih new balance ex's next to counter top. Refer to pt instructions for full details. min guard assist for balance. cues for ex form provided.                                  PT Education - 11/08/17 1010    Education Details  updated HEP today.     Person(s) Educated  Patient    Methods  Explanation;Demonstration;Verbal cues;Handout    Comprehension  Verbalized understanding;Returned demonstration;Verbal cues required;Need further instruction       PT Short Term Goals - 11/06/17 2223      PT SHORT TERM GOAL #1   Title  Pt will be independent with initial HEP in order to indicate improved functional mobility and decreased fall risk.     Time  4    Period  Weeks    Status  New    Target Date  11/10/17      PT SHORT TERM GOAL #2   Title  Pt will improve 5TSS to </=11 secs without UE support in order to indicate decreased fall risk and improved functional strength.      Baseline  12 seconds without use of UE    Time  4    Period  Weeks    Status  New      PT  SHORT TERM GOAL #3   Title  Pt will improve FGA to >/= 24/30 in order to indicate decreased fall risk.      Baseline  21/30    Time  4    Period  Weeks    Status  New      PT SHORT TERM GOAL #4   Title  Pt will ambulate at gait speed of >/=3.6 ft/sec with decreased indication of R foot slap/overt gait deviations indicating safe gait speed.     Baseline  3.54 ft/sec     Time  4    Period  Weeks    Status  New  PT SHORT TERM GOAL #5   Title  Pt will tolerate set up and use of Bioness functional electrical stimulation for DF assist on RLE    Baseline  Bioness set up and patient can tolerate at low intensity    Time  4    Period  Weeks    Status  Achieved        PT Long Term Goals - 10/11/17 2145      PT LONG TERM GOAL #1   Title  Pt will be independent with final HEP in order to indicate improved functional mobility and decreased fall risk.     Time  8    Period  Weeks    Status  New    Target Date  12/10/17      PT LONG TERM GOAL #2   Title  Pt will improve FGA to >/=27/30 in order to indicate decreased fall risk.      Time  8    Period  Weeks    Status  New    Target Date  12/10/17      PT LONG TERM GOAL #3   Title  Pt will improve gait velocity to >/= 3.8 ft/sec with decreased evidence of R foot drop and genu recurvatum to decrease falls risk in community    Time  8    Period  Weeks    Status  New    Target Date  12/10/17      PT LONG TERM GOAL #4   Title  Pt will ambulate >1000' over varying outdoor surfaces with decreased incidence of R foot slap/drag and increased foot clearance to indicate safer ambulation in community    Time  8    Period  Weeks    Status  New    Target Date  12/10/17      PT LONG TERM GOAL #5   Title  Pt will improve cervical rotation by 10 deg bilaterally in order to indicate improved safety with driving      Baseline  40 deg bilaterally    Time  8    Period  Weeks    Status  New    Target Date  12/10/17            Plan  - 11/08/17 4696    Clinical Impression Statement  Today's skilled session focused on updating pt's HEP. Removed bird dog due to increased instability and removed supine over foam LE marching due to too easy. No issues reported with performance in session today. The pt is progressing toward goals and should benefit from continued PT to progress toward unmet goals.    PT Treatment/Interventions  ADLs/Self Care Home Management;Electrical Stimulation;Gait training;Stair training;Functional mobility training;Therapeutic activities;Therapeutic exercise;Balance training;Neuromuscular re-education;Patient/family education;Orthotic Fit/Training;Passive range of motion;Manual techniques;Aquatic Therapy;Moist Heat;Taping    PT Next Visit Plan  (MD SAID NO TO DRY NEEDLING); continue to address balance and LE strengthening, Bioness to right LE as able with session for improved muscle strengthening.     PT Home Exercise Plan  [email or web address]     Consulted and Agree with Plan of Care  Patient       Patient will benefit from skilled therapeutic intervention in order to improve the following deficits and impairments:  Abnormal gait, Decreased balance, Decreased range of motion, Decreased strength, Hypomobility, Impaired flexibility, Postural dysfunction, Difficulty walking, Impaired sensation  Visit Diagnosis: Muscle weakness (generalized)  Unsteadiness on feet     Problem List Patient Active Problem List  Diagnosis Date Noted  . History of GI diverticular bleed 09/11/2017  . Lower GI bleed   . Acute blood loss anemia   . Diverticulosis   . 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 Kidney transplant recipient 01/21/2017  . Anxiety 11/21/2016  . Stenosis of cervical spine with myelopathy ([place]) 08/12/2016  . Routine general medical examination at a health care facility 06/01/2016  . Hypothyroidism 03/24/2015  . History of tachycardia 03/24/2015  . HTN (hypertension), benign 06/12/2013  . ESRD on dialysis [facility])  06/12/2013    Willow Ora, PTA, [facility] [address] [place], [place] [address] [phone] 11/08/17, 3:23 PM    Physical Therapy Progress Note   Dates of Reporting Period: 10/11/2017-11/10/2017   Objective Reports of Subjective Statement:patient notes return to walking with friends, she is walking for community distances and participating in HEP.   Objective Measurements: Patient has been fit for bioness in therapy to provide functional e-stim for R anterior tibialis strengthening for gait.  Patient progressing towards STGs.  Goal Update: see above   Plan: see above    Reason Skilled Services are Required: R LE strengthening, flexibility, balance training, gait training needed to optimize functional status.   Thank you for the referral of this patient. Rudell Cobb, MPT   Name: [name] MRN: [id number] Date of Birth: 1961/01/02

## 2017-11-13 ENCOUNTER — Ambulatory Visit: Payer: Medicare Other | Admitting: Rehabilitative and Restorative Service Providers"

## 2017-11-13 ENCOUNTER — Encounter: Payer: Self-pay | Admitting: Rehabilitative and Restorative Service Providers"

## 2017-11-13 DIAGNOSIS — R2681 Unsteadiness on feet: Secondary | ICD-10-CM | POA: Diagnosis not present

## 2017-11-13 DIAGNOSIS — R278 Other lack of coordination: Secondary | ICD-10-CM | POA: Diagnosis not present

## 2017-11-13 DIAGNOSIS — M542 Cervicalgia: Secondary | ICD-10-CM

## 2017-11-13 DIAGNOSIS — R2689 Other abnormalities of gait and mobility: Secondary | ICD-10-CM

## 2017-11-13 DIAGNOSIS — M21371 Foot drop, right foot: Secondary | ICD-10-CM

## 2017-11-13 DIAGNOSIS — M6281 Muscle weakness (generalized): Secondary | ICD-10-CM | POA: Diagnosis not present

## 2017-11-14 NOTE — Therapy (Signed)
[facility] [address] [place], [place], [address] Phone: [phone]   Fax:  [phone]  Physical Therapy Treatment  Patient Details  Name: [name] MRN: [id number] Date of Birth: 07/11/1960 Referring Provider (PT): Hoyt Koch, MD   Encounter Date: 11/13/2017  PT End of Session - 11/13/17 0940    Visit Number  11    Number of Visits  17    Date for PT Re-Evaluation  12/10/17    Authorization Type  Medicare - 10th visit PN    PT Start Time  0935    PT Stop Time  1015    PT Time Calculation (min)  40 min    Activity Tolerance  Patient tolerated treatment well;No increased pain    Behavior During Therapy  WFL for tasks assessed/performed       Past Medical History:  Diagnosis Date  . Acute blood loss anemia   . Anemia   . Anxiety   . Arthritis   . Claustrophobia   . Diverticulosis   . ESRD (end stage renal disease) on dialysis ([facility])    "TTS; [facility]" (03/24/2015)  . GI bleed   . Glomerulonephritis   . Heart murmur   . HTN (hypertension)   . Hypothyroidism   . Kidney transplant recipient 01/21/2017  . Pancreatitis   . Renal insufficiency   . Secondary hyperparathyroidism ([facility])    [name] 05/11/2014    Past Surgical History:  Procedure Laterality Date  . ANTERIOR CERVICAL CORPECTOMY N/A 05/24/2017   Procedure: CORPECTOMY CERVICAL FIVE- CERVICAL SIX;  Surgeon: Ashok Pall, MD;  Location: [facility];  Service: Neurosurgery;  Laterality: N/A;  . ARTERIOVENOUS GRAFT PLACEMENT Right 1990's?  . ARTERIOVENOUS GRAFT PLACEMENT Left 07/2002   upper arm/notes 06/02/2010  . ARTERIOVENOUS GRAFT PLACEMENT Right 07/2003   upper arm/notes 06/02/2010  . AV FISTULA PLACEMENT Left 09/2000   upper arm/notes 06/02/2010  . BREAST BIOPSY Left unsure   benign  . COLONOSCOPY    . DILATATION & CURRETTAGE/HYSTEROSCOPY WITH RESECTOCOPE N/A 04/17/2013   Procedure: DILATATION & CURETTAGE/HYSTEROSCOPY WITH  RESECTOCOPE;  Surgeon: Marvene Staff, MD;  Location: [facility];  Service: Gynecology;  Laterality: N/A;  . DILATION AND CURETTAGE OF UTERUS    . EYE SURGERY    . KIDNEY TRANSPLANT  1997  . KIDNEY TRANSPLANT  01/21/2017   Dr. Harrington Challenger  . PARATHYROIDECTOMY  03/2000 05/12/2014   w/neck exploration & autotransplantation/notes 06/02/2010; w/neck exploration  . PARATHYROIDECTOMY N/A 05/12/2014   Procedure: PARATHYROIDECTOMY AND NECK EXPLORATION;  Surgeon: Armandina Gemma, MD;  Location: [facility];  Service: General;  Laterality: N/A;  . PERITONEAL CATHETER INSERTION    . PERITONEAL CATHETER REMOVAL  08/1999   [name] 06/02/2010  . POSTERIOR CERVICAL FUSION/FORAMINOTOMY N/A 05/30/2017   Procedure: POSTERIOR CERVICAL Arthrodesis Cervical three - four, cervical four - five, cervical five - six, cervical six - seven;  Surgeon: Ashok Pall, MD;  Location: [facility];  Service: Neurosurgery;  Laterality: N/A;  . POSTERIOR CERVICAL LAMINECTOMY N/A 08/12/2016   Procedure: POSTERIOR CERVICAL LAMINECTOMY CERVICAL THREE CERVICAL FOUR, CERVICAL FOUR CERVICAL FIVE, CERVICAL FIVE- CERVICAL SIX, CERVICAL SIX- CERVICAL SEVEN;  Surgeon: Ashok Pall, MD;  Location: [facility];  Service: Neurosurgery;  Laterality: N/A;  POSTERIOR  . RETINAL DETACHMENT SURGERY Left   . THROMBECTOMY Left 02/2002   fistula/notes 06/02/2010  . THROMBECTOMY / ARTERIOVENOUS GRAFT REVISION  11/2003; 01/2004; 08/07/2005; 08/10/2005; 10/2005   [name] 06/02/2010  . THROMBECTOMY AND REVISION OF  ARTERIOVENTOUS (AV) GORETEX  GRAFT Right 01/2002; 11/2003; 01/2004; 08/07/2004; 08/10/2004; 11/13/2005   [name] 5/1/2012Marland Kitchen [name] 5/16/2012Marland Kitchen [name] 5/16/2012Marland Kitchen [name] 5/16/2012Marland Kitchen [name] 5/16/2012Marland Kitchen [name] 06/02/2010  . THROMBECTOMY AND REVISION OF ARTERIOVENTOUS (AV) GORETEX  GRAFT Left 08/23/2002; 09/13/2002; 07/08/2003   [name] 06/02/2010; [name] 06/02/2010; [name] 06/02/2010    There were no vitals filed for this visit.  Subjective Assessment - 11/13/17 0938    Subjective  The patient  notes she was started on a new medication and she feels groggy this morning.    Pertinent History  lower GI bleed due to diverticulitis, pancreatitis, heart murmur, hypothyroidism, HTN, stenosis of cervical spine with myelopathy with posterior fusion/foraminotomy, anterior cervical corpectomy, posterior cervical laminectomy, parathyroidectomy, ESRD with kidney transplant in January of 2019, anxiety, and anemia    Patient Stated Goals  Focus on core strength, neck and balance    Currently in Pain?  No/denies                       [facility] Adult PT Treatment/Exercise - 11/13/17 0940      Ambulation/Gait   Ambulation/Gait  Yes    Ambulation/Gait Assistance  5: Supervision    Ambulation/Gait Assistance Details  Noting dec'd right ankle DF entering clinic.  Outdoor ambulation on gravel and rubber mulch with supervision without loss of balance.    Ambulation Distance (Feet)  250 Feet    Assistive device  None    Gait Pattern  Step-through pattern;Poor foot clearance - right;Decreased hip/knee flexion - right;Decreased hip/knee flexion - left;Decreased dorsiflexion - right;Decreased dorsiflexion - left;Decreased stance time - right;Decreased weight shift to right    Ambulation Surface  Indoor;Outdoor;Gravel;Other (comment)      Neuro Re-ed    Neuro Re-ed Details   Quadriped working on sliding R LE posteriorly while flexing foot (foot in contact with mat), alternating L and R with cues for UE engagement.   Standing trunk rotation with one UE through mat opening up laterally lifting R UE/LE with min A while balancing through L UE/LE.  Performed 3 reps to each side.       Exercises   Exercises  Other Exercises    Other Exercises   STANDING:  heel raises x 10 with heels hovering over edge of 2" step, toe raises x 10 with toes over anterior 2" block with min A.  Squats with isometric  5 seconds holds x 5 reps, and then shifting R and L lifting toes and heels with intermittent UE support.  "Threat the Needle" in quadriped for trunk/shoulder stretching.  Alternating LE hip extension weight bearing through elbows on mat lifting R and then L LEs x 5 reps each with min A.               PT Short Term Goals - 11/06/17 2223      PT SHORT TERM GOAL #1   Title  Pt will be independent with initial HEP in order to indicate improved functional mobility and decreased fall risk.     Time  4    Period  Weeks    Status  New    Target Date  11/10/17      PT SHORT TERM GOAL #2   Title  Pt will improve 5TSS to </=11 secs without UE support in order to indicate decreased fall risk and improved functional strength.      Baseline  12 seconds without use of UE    Time  4    Period  Weeks    Status  New      PT SHORT TERM GOAL #3   Title  Pt will improve FGA to >/= 24/30 in order to indicate decreased fall risk.      Baseline  21/30    Time  4    Period  Weeks    Status  New      PT SHORT TERM GOAL #4   Title  Pt will ambulate at gait speed of >/=3.6 ft/sec with decreased indication of R foot slap/overt gait deviations indicating safe gait speed.     Baseline  3.54 ft/sec     Time  4    Period  Weeks    Status  New      PT SHORT TERM GOAL #5   Title  Pt will tolerate set up and use of Bioness functional electrical stimulation for DF assist on RLE    Baseline  Bioness set up and patient can tolerate at low intensity    Time  4    Period  Weeks    Status  Achieved        PT Long Term Goals - 10/11/17 2145      PT LONG TERM GOAL #1   Title  Pt will be independent with final HEP in order to indicate improved functional mobility and decreased fall risk.     Time  8    Period  Weeks    Status  New    Target Date  12/10/17      PT LONG TERM GOAL #2   Title  Pt will improve FGA to >/=27/30 in order to indicate decreased fall risk.      Time  8    Period  Weeks    Status  New    Target Date  12/10/17      PT LONG TERM GOAL #3   Title  Pt will improve gait velocity  to >/= 3.8 ft/sec with decreased evidence of R foot drop and genu recurvatum to decrease falls risk in community    Time  8    Period  Weeks    Status  New    Target Date  12/10/17      PT LONG TERM GOAL #4   Title  Pt will ambulate >1000' over varying outdoor surfaces with decreased incidence of R foot slap/drag and increased foot clearance to indicate safer ambulation in community    Time  8    Period  Weeks    Status  New    Target Date  12/10/17      PT LONG TERM GOAL #5   Title  Pt will improve cervical rotation by 10 deg bilaterally in order to indicate improved safety with driving      Baseline  40 deg bilaterally    Time  8    Period  Weeks    Status  New    Target Date  12/10/17            Plan - 11/14/17 0820    Clinical Impression Statement  Patient's short term goals are due-- plan to check next visit.  The patient verbally reports improving gait, with some difficulty on transitioning between paved and grassy surfaces in the community.  Patient making progress with mobility, balance, and strengthening.    PT Treatment/Interventions  ADLs/Self Care Home Management;Electrical Stimulation;Gait training;Stair training;Functional mobility training;Therapeutic activities;Therapeutic exercise;Balance training;Neuromuscular re-education;Patient/family education;Orthotic Fit/Training;Passive range of motion;Manual techniques;Aquatic Therapy;Moist Heat;Taping  PT Next Visit Plan  CHECK SHORT TERM GOALS:   (MD said no to dry needling); continue to address balance and LE strengthening, Bioness to right LE as able with session for improved muscle strengthening.     Consulted and Agree with Plan of Care  Patient       Patient will benefit from skilled therapeutic intervention in order to improve the following deficits and impairments:  Abnormal gait, Decreased balance, Decreased range of motion, Decreased strength, Hypomobility, Impaired flexibility, Postural dysfunction,  Difficulty walking, Impaired sensation  Visit Diagnosis: Muscle weakness (generalized)  Unsteadiness on feet  Other abnormalities of gait and mobility  Cervicalgia  Foot drop, right     Problem List Patient Active Problem List   Diagnosis Date Noted  . History of GI diverticular bleed 09/11/2017  . Lower GI bleed   . Acute blood loss anemia   . Diverticulosis   . 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 Kidney transplant recipient 01/21/2017  . Anxiety 11/21/2016  . Stenosis of cervical spine with myelopathy ([place]) 08/12/2016  . Routine general medical examination at a health care facility 06/01/2016  . Hypothyroidism 03/24/2015  . History of tachycardia 03/24/2015  . HTN (hypertension), benign 06/12/2013  . ESRD on dialysis [facility]) 06/12/2013    , PT 11/14/2017, 8:23 AM  [facility] [address] [place], [place], [address] Phone: [phone]   Fax:  [phone]  Name: [name] MRN: [id number] Date of Birth: 03-03-1960

## 2017-11-15 ENCOUNTER — Encounter: Payer: Self-pay | Admitting: Physical Therapy

## 2017-11-15 ENCOUNTER — Ambulatory Visit: Payer: Medicare Other | Admitting: Physical Therapy

## 2017-11-15 DIAGNOSIS — M542 Cervicalgia: Secondary | ICD-10-CM | POA: Diagnosis not present

## 2017-11-15 DIAGNOSIS — Z4822 Encounter for aftercare following kidney transplant: Secondary | ICD-10-CM | POA: Diagnosis not present

## 2017-11-15 DIAGNOSIS — Z940 Kidney transplant status: Secondary | ICD-10-CM | POA: Diagnosis not present

## 2017-11-15 DIAGNOSIS — M6281 Muscle weakness (generalized): Secondary | ICD-10-CM

## 2017-11-15 DIAGNOSIS — R2681 Unsteadiness on feet: Secondary | ICD-10-CM | POA: Diagnosis not present

## 2017-11-15 DIAGNOSIS — R2689 Other abnormalities of gait and mobility: Secondary | ICD-10-CM | POA: Diagnosis not present

## 2017-11-15 DIAGNOSIS — R278 Other lack of coordination: Secondary | ICD-10-CM | POA: Diagnosis not present

## 2017-11-15 DIAGNOSIS — M21371 Foot drop, right foot: Secondary | ICD-10-CM | POA: Diagnosis not present

## 2017-11-15 NOTE — Therapy (Signed)
[facility] [address] [place], [place], [address] Phone: [phone]   Fax:  [phone]  Physical Therapy Treatment  Patient Details  Name: [name] MRN: [id number] Date of Birth: 08-23-1960 Referring Provider (PT): Hoyt Koch, MD   Encounter Date: 11/15/2017  PT End of Session - 11/15/17 1024    Visit Number  12    Number of Visits  17    Date for PT Re-Evaluation  12/10/17    Authorization Type  Medicare - 10th visit PN    PT Start Time  0933    PT Stop Time  1015    PT Time Calculation (min)  42 min    Equipment Utilized During Treatment  Gait belt    Activity Tolerance  Patient tolerated treatment well;No increased pain    Behavior During Therapy  WFL for tasks assessed/performed       Past Medical History:  Diagnosis Date  . Acute blood loss anemia   . Anemia   . Anxiety   . Arthritis   . Claustrophobia   . Diverticulosis   . ESRD (end stage renal disease) on dialysis ([place])    "TTS; [place]" (03/24/2015)  . GI bleed   . Glomerulonephritis   . Heart murmur   . HTN (hypertension)   . Hypothyroidism   . Kidney transplant recipient 01/21/2017  . Pancreatitis   . Renal insufficiency   . Secondary hyperparathyroidism ([place])    [name] 05/11/2014    Past Surgical History:  Procedure Laterality Date  . ANTERIOR CERVICAL CORPECTOMY N/A 05/24/2017   Procedure: CORPECTOMY CERVICAL FIVE- CERVICAL SIX;  Surgeon: Ashok Pall, MD;  Location: [facility];  Service: Neurosurgery;  Laterality: N/A;  . ARTERIOVENOUS GRAFT PLACEMENT Right 1990's?  . ARTERIOVENOUS GRAFT PLACEMENT Left 07/2002   upper arm/notes 06/02/2010  . ARTERIOVENOUS GRAFT PLACEMENT Right 07/2003   upper arm/notes 06/02/2010  . AV FISTULA PLACEMENT Left 09/2000   upper arm/notes 06/02/2010  . BREAST BIOPSY Left unsure   benign  . COLONOSCOPY    . DILATATION & CURRETTAGE/HYSTEROSCOPY WITH RESECTOCOPE N/A 04/17/2013    Procedure: DILATATION & CURETTAGE/HYSTEROSCOPY WITH RESECTOCOPE;  Surgeon: Marvene Staff, MD;  Location: [facility];  Service: Gynecology;  Laterality: N/A;  . DILATION AND CURETTAGE OF UTERUS    . EYE SURGERY    . KIDNEY TRANSPLANT  1997  . KIDNEY TRANSPLANT  01/21/2017   Dr. Harrington Challenger  . PARATHYROIDECTOMY  03/2000 05/12/2014   w/neck exploration & autotransplantation/notes 06/02/2010; w/neck exploration  . PARATHYROIDECTOMY N/A 05/12/2014   Procedure: PARATHYROIDECTOMY AND NECK EXPLORATION;  Surgeon: Armandina Gemma, MD;  Location: [facility];  Service: General;  Laterality: N/A;  . PERITONEAL CATHETER INSERTION    . PERITONEAL CATHETER REMOVAL  08/1999   [name] 06/02/2010  . POSTERIOR CERVICAL FUSION/FORAMINOTOMY N/A 05/30/2017   Procedure: POSTERIOR CERVICAL Arthrodesis Cervical three - four, cervical four - five, cervical five - six, cervical six - seven;  Surgeon: Ashok Pall, MD;  Location: [facility];  Service: Neurosurgery;  Laterality: N/A;  . POSTERIOR CERVICAL LAMINECTOMY N/A 08/12/2016   Procedure: POSTERIOR CERVICAL LAMINECTOMY CERVICAL THREE CERVICAL FOUR, CERVICAL FOUR CERVICAL FIVE, CERVICAL FIVE- CERVICAL SIX, CERVICAL SIX- CERVICAL SEVEN;  Surgeon: Ashok Pall, MD;  Location: [facility];  Service: Neurosurgery;  Laterality: N/A;  POSTERIOR  . RETINAL DETACHMENT SURGERY Left   . THROMBECTOMY Left 02/2002   fistula/notes 06/02/2010  . THROMBECTOMY / ARTERIOVENOUS GRAFT REVISION  11/2003; 01/2004; 08/07/2005; 08/10/2005; 10/2005   /  notes 06/02/2010  . THROMBECTOMY AND REVISION OF ARTERIOVENTOUS (AV) GORETEX  GRAFT Right 01/2002; 11/2003; 01/2004; 08/07/2004; 08/10/2004; 11/13/2005   [name] 5/1/2012Marland Kitchen [name] 5/16/2012Marland Kitchen [name] 5/16/2012Marland Kitchen [name] 5/16/2012Marland Kitchen [name] 06/02/2010; [name] 06/02/2010  . THROMBECTOMY AND REVISION OF ARTERIOVENTOUS (AV) GORETEX  GRAFT Left 08/23/2002; 09/13/2002; 07/08/2003   [name] 06/02/2010; [name] 06/02/2010; [name] 06/02/2010    There were no vitals filed for this visit.  Subjective  Assessment - 11/15/17 0935    Subjective  Pt complains of feeling groggy in the morning from new medication     Pertinent History  lower GI bleed due to diverticulitis, pancreatitis, heart murmur, hypothyroidism, HTN, stenosis of cervical spine with myelopathy with posterior fusion/foraminotomy, anterior cervical corpectomy, posterior cervical laminectomy, parathyroidectomy, ESRD with kidney transplant in January of 2019, anxiety, and anemia    Patient Stated Goals  Focus on core strength, neck and balance    Currently in Pain?  No/denies         [facility] PT Assessment - 11/15/17 0939      Standardized Balance Assessment   Standardized Balance Assessment  Five Times Sit to Stand;10 meter walk test    Five times sit to stand comments   10.25 from chair without UE support    10 Meter Walk  6.93 or 4.73 ft/sec      Functional Gait  Assessment   Gait assessed   Yes    Gait Level Surface  Walks 20 ft in less than 5.5 sec, no assistive devices, good speed, no evidence for imbalance, normal gait pattern, deviates no more than 6 in outside of the 12 in walkway width.    Change in Gait Speed  Able to smoothly change walking speed without loss of balance or gait deviation. Deviate no more than 6 in outside of the 12 in walkway width.    Gait with Horizontal Head Turns  Performs head turns smoothly with no change in gait. Deviates no more than 6 in outside 12 in walkway width    Gait with Vertical Head Turns  Performs head turns with no change in gait. Deviates no more than 6 in outside 12 in walkway width.    Gait and Pivot Turn  Pivot turns safely within 3 sec and stops quickly with no loss of balance.    Step Over Obstacle  Is able to step over 2 stacked shoe boxes taped together (9 in total height) without changing gait speed. No evidence of imbalance.    Gait with Narrow Base of Support  Ambulates less than 4 steps heel to toe or cannot perform without assistance.    Gait with Eyes Closed  Walks 20  ft, no assistive devices, good speed, no evidence of imbalance, normal gait pattern, deviates no more than 6 in outside 12 in walkway width. Ambulates 20 ft in less than 7 sec.    Ambulating Backwards  Walks 20 ft, uses assistive device, slower speed, mild gait deviations, deviates 6-10 in outside 12 in walkway width.    Steps  Alternating feet, must use rail.    Total Score  25           [facility] Adult PT Treatment/Exercise - 11/15/17 0001      High Level Balance   High Level Balance Comments  In parallel bars; Rocker board 1 rep eyes open; 2 reps eyes closed x30 sec; ant-post weight shifts; lat weight shifts; on rocker board alternating steps forward to floor/back onto board, cues on step length/height. On floor: Single leg  stance with 1 foot on ball rolling foward and back x10 both legs. Min a required for occasional LOB with single leg activities. Intermittent UE support needed. Cues and demo required for proper technique.            PT Short Term Goals - 11/15/17 1761      PT SHORT TERM GOAL #1   Title  Pt will be independent with initial HEP in order to indicate improved functional mobility and decreased fall risk.     Baseline  11/15/17: met today    Time  4    Period  Weeks    Status  Achieved      PT SHORT TERM GOAL #2   Title  Pt will improve 5TSS to </=11 secs without UE support in order to indicate decreased fall risk and improved functional strength.      Baseline  11/15/17: Met today; 10.25 sec from chair without UE support    Time  4    Period  Weeks    Status  Achieved      PT SHORT TERM GOAL #3   Title  Pt will improve FGA to >/= 24/30 in order to indicate decreased fall risk.      Baseline  11/15/17: Met today; 25/30    Time  4    Period  Weeks    Status  Achieved      PT SHORT TERM GOAL #4   Title  Pt will ambulate at gait speed of >/=3.6 ft/sec with decreased indication of R foot slap/overt gait deviations indicating safe gait speed.     Baseline   11/15/17: Met today; 4.73 ft/sec    Time  4    Period  Weeks    Status  Achieved      PT SHORT TERM GOAL #5   Title  Pt will tolerate set up and use of Bioness functional electrical stimulation for DF assist on RLE    Baseline  Bioness set up and patient can tolerate at low intensity    Time  4    Period  Weeks    Status  Achieved        PT Long Term Goals - 10/11/17 2145      PT LONG TERM GOAL #1   Title  Pt will be independent with final HEP in order to indicate improved functional mobility and decreased fall risk.     Time  8    Period  Weeks    Status  New    Target Date  12/10/17      PT LONG TERM GOAL #2   Title  Pt will improve FGA to >/=27/30 in order to indicate decreased fall risk.      Time  8    Period  Weeks    Status  New    Target Date  12/10/17      PT LONG TERM GOAL #3   Title  Pt will improve gait velocity to >/= 3.8 ft/sec with decreased evidence of R foot drop and genu recurvatum to decrease falls risk in community    Time  8    Period  Weeks    Status  New    Target Date  12/10/17      PT LONG TERM GOAL #4   Title  Pt will ambulate >1000' over varying outdoor surfaces with decreased incidence of R foot slap/drag and increased foot clearance to indicate safer ambulation in community  Time  8    Period  Weeks    Status  New    Target Date  12/10/17      PT LONG TERM GOAL #5   Title  Pt will improve cervical rotation by 10 deg bilaterally in order to indicate improved safety with driving      Baseline  40 deg bilaterally    Time  8    Period  Weeks    Status  New    Target Date  12/10/17            Plan - 11/15/17 1025    Clinical Impression Statement  Today's session focused on assessing STGs and dynamic balance activities. Pt has met all STGs and was able to tolerate advanced balance exercises emphasizing transitioning steps to and from surfaces and SL stance. Pt would benefit from continued PT to progress towards LTGs.     Rehab  Potential  --    PT Frequency  --    PT Duration  --    PT Treatment/Interventions  ADLs/Self Care Home Management;Electrical Stimulation;Gait training;Stair training;Functional mobility training;Therapeutic activities;Therapeutic exercise;Balance training;Neuromuscular re-education;Patient/family education;Orthotic Fit/Training;Passive range of motion;Manual techniques;Aquatic Therapy;Moist Heat;Taping    PT Next Visit Plan  (MD said no to dry needling); continue to address balance and LE strengthening, Bioness to right LE as able with session for improved muscle strengthening.     Consulted and Agree with Plan of Care  Patient       Patient will benefit from skilled therapeutic intervention in order to improve the following deficits and impairments:  Abnormal gait, Decreased balance, Decreased range of motion, Decreased strength, Hypomobility, Impaired flexibility, Postural dysfunction, Difficulty walking, Impaired sensation  Visit Diagnosis: Muscle weakness (generalized)  Unsteadiness on feet     Problem List Patient Active Problem List   Diagnosis Date Noted  . History of GI diverticular bleed 09/11/2017  . Lower GI bleed   . Acute blood loss anemia   . Diverticulosis   . 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 Kidney transplant recipient 01/21/2017  . Anxiety 11/21/2016  . Stenosis of cervical spine with myelopathy ([facility]) 08/12/2016  . Routine general medical examination at a health care facility 06/01/2016  . Hypothyroidism 03/24/2015  . History of tachycardia 03/24/2015  . HTN (hypertension), benign 06/12/2013  . ESRD on dialysis [facility]) 06/12/2013    Cecile Sheerer, SPTA 11/15/2017, 1:41 PM  [facility] [address] [place], [place], [address] Phone: [phone]   Fax:  [phone]  Name: [name] MRN: [id number] Date of Birth: 09/23/1960

## 2017-11-20 ENCOUNTER — Ambulatory Visit: Payer: Medicare Other | Attending: Internal Medicine | Admitting: Rehabilitation

## 2017-11-20 ENCOUNTER — Encounter: Payer: Self-pay | Admitting: Rehabilitation

## 2017-11-20 DIAGNOSIS — M21371 Foot drop, right foot: Secondary | ICD-10-CM | POA: Diagnosis not present

## 2017-11-20 DIAGNOSIS — R2689 Other abnormalities of gait and mobility: Secondary | ICD-10-CM | POA: Diagnosis not present

## 2017-11-20 DIAGNOSIS — R2681 Unsteadiness on feet: Secondary | ICD-10-CM | POA: Diagnosis not present

## 2017-11-20 DIAGNOSIS — M6281 Muscle weakness (generalized): Secondary | ICD-10-CM

## 2017-11-20 NOTE — Patient Instructions (Signed)
Access Code: [email or web address]  URL: [email or web address]  Date: 11/20/2017  Prepared by: Cameron Sprang   Exercises  Quadruped Alternating Leg Extensions - 10 reps - 3 sets - 1x daily - 5x weekly  Quadruped Fire Hydrant - 10 reps - 3 sets - 1x daily - 5x weekly  Supine Static Chest Stretch on Foam Roll - 2 reps - 1 sets - 60 hold - 2x daily - 7x weekly  Seated Ankle Dorsiflexion with Resistance - 5 reps - 3 sets - 2-3 hold - 1x daily - 7x weekly  Standing Heel Raise - 15 reps - 1 sets - 1x daily - 7x weekly  Romberg Stance Eyes Closed on Foam Pad - 3 reps - 1 sets - 30 hold - 1x daily - 5x weekly  Toe Raises with Counter Support - 15 reps - 1 sets - 1x daily - 7x weekly  Narrow Stance with Eyes Closed and Head Rotation on Foam Pad - 10 reps - 1 sets - 1x daily - 5x weekly  Long Sitting Calf Stretch with Strap - 3 reps - 1 sets - 30 hold - 1-2x daily - 7x weekly  Narrow Stance with Eyes Closed and Head Nods on Foam Pad - 10 reps - 1 sets - 1x daily - 5x weekly  Standard Plank - 3 reps - 2 sets - 5 hold - 1x daily - 7x weekly  Squat - 10 reps - 3 sets - 1x daily - 7x weekly

## 2017-11-20 NOTE — Therapy (Signed)
[facility] [address] [place], [place], [address] Phone: [phone]   Fax:  [phone]  Physical Therapy Treatment  Patient Details  Name: [name] MRN: [id number] Date of Birth: [birth year] Referring Provider (PT): Hoyt Koch, MD   Encounter Date: 11/20/2017  PT End of Session - 11/20/17 1556    Visit Number  13    Number of Visits  17    Date for PT Re-Evaluation  12/10/17    Authorization Type  Medicare - 10th visit PN    PT Start Time  0933    PT Stop Time  1016    PT Time Calculation (min)  43 min    Equipment Utilized During Treatment  Gait belt    Activity Tolerance  Patient tolerated treatment well;No increased pain    Behavior During Therapy  WFL for tasks assessed/performed       Past Medical History:  Diagnosis Date  . Acute blood loss anemia   . Anemia   . Anxiety   . Arthritis   . Claustrophobia   . Diverticulosis   . ESRD (end stage renal disease) on dialysis ([place])    "TTS; [facility]" (03/24/2015)  . GI bleed   . Glomerulonephritis   . Heart murmur   . HTN (hypertension)   . Hypothyroidism   . Kidney transplant recipient 01/21/2017  . Pancreatitis   . Renal insufficiency   . Secondary hyperparathyroidism ([place] [facility])    [name] 05/11/2014    Past Surgical History:  Procedure Laterality Date  . ANTERIOR CERVICAL CORPECTOMY N/A 05/24/2017   Procedure: CORPECTOMY CERVICAL FIVE- CERVICAL SIX;  Surgeon: Ashok Pall, MD;  Location: [facility];  Service: Neurosurgery;  Laterality: N/A;  . ARTERIOVENOUS GRAFT PLACEMENT Right 1990's?  . ARTERIOVENOUS GRAFT PLACEMENT Left 07/2002   upper arm/notes 06/02/2010  . ARTERIOVENOUS GRAFT PLACEMENT Right 07/2003   upper arm/notes 06/02/2010  . AV FISTULA PLACEMENT Left 09/2000   upper arm/notes 06/02/2010  . BREAST BIOPSY Left unsure   benign  . COLONOSCOPY    . DILATATION & CURRETTAGE/HYSTEROSCOPY WITH RESECTOCOPE N/A 04/17/2013   Procedure: DILATATION & CURETTAGE/HYSTEROSCOPY WITH RESECTOCOPE;  Surgeon: Marvene Staff, MD;  Location: [facility];  Service: Gynecology;  Laterality: N/A;  . DILATION AND CURETTAGE OF UTERUS    . EYE SURGERY    . KIDNEY TRANSPLANT  1997  . KIDNEY TRANSPLANT  01/21/2017   Dr. Harrington Challenger  . PARATHYROIDECTOMY  03/2000 05/12/2014   w/neck exploration & autotransplantation/notes 06/02/2010; w/neck exploration  . PARATHYROIDECTOMY N/A 05/12/2014   Procedure: PARATHYROIDECTOMY AND NECK EXPLORATION;  Surgeon: Armandina Gemma, MD;  Location: [facility];  Service: General;  Laterality: N/A;  . PERITONEAL CATHETER INSERTION    . PERITONEAL CATHETER REMOVAL  08/1999   [name] 06/02/2010  . POSTERIOR CERVICAL FUSION/FORAMINOTOMY N/A 05/30/2017   Procedure: POSTERIOR CERVICAL Arthrodesis Cervical three - four, cervical four - five, cervical five - six, cervical six - seven;  Surgeon: Ashok Pall, MD;  Location: [facility];  Service: Neurosurgery;  Laterality: N/A;  . POSTERIOR CERVICAL LAMINECTOMY N/A 08/12/2016   Procedure: POSTERIOR CERVICAL LAMINECTOMY CERVICAL THREE CERVICAL FOUR, CERVICAL FOUR CERVICAL FIVE, CERVICAL FIVE- CERVICAL SIX, CERVICAL SIX- CERVICAL SEVEN;  Surgeon: Ashok Pall, MD;  Location: [facility];  Service: Neurosurgery;  Laterality: N/A;  POSTERIOR  . RETINAL DETACHMENT SURGERY Left   . THROMBECTOMY Left 02/2002   fistula/notes 06/02/2010  . THROMBECTOMY / ARTERIOVENOUS GRAFT REVISION  11/2003; 01/2004; 08/07/2005; 08/10/2005; 10/2005   /  notes 06/02/2010  . THROMBECTOMY AND REVISION OF ARTERIOVENTOUS (AV) GORETEX  GRAFT Right 01/2002; 11/2003; 01/2004; 08/07/2004; 08/10/2004; 11/13/2005   [name] 5/1/2012Marland Kitchen [name] 5/16/2012Marland Kitchen [name] 5/16/2012Marland Kitchen [name] 5/16/2012Marland Kitchen [name] 06/02/2010; [name] 06/02/2010  . THROMBECTOMY AND REVISION OF ARTERIOVENTOUS (AV) GORETEX  GRAFT Left 08/23/2002; 09/13/2002; 07/08/2003   [name] 06/02/2010; [name] 06/02/2010; [name] 06/02/2010    There were no vitals filed for this visit.  Subjective  Assessment - 11/20/17 0937    Subjective  Pt did get new shoes (for over pronation) Friday.  Wore them Friday about 2 hours and today about 40 mins.     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  When she first stands up and starts walking, R foot drags    Patient Stated Goals  Focus on core strength, neck and balance    Currently in Pain?  No/denies                       [facility] Adult PT Treatment/Exercise - 11/20/17 1008      Neuro Re-ed    Neuro Re-ed Details   R ankle strenthening/NMR:  R LE in stance on green foam balance pad while tapping LLE to 2 targets out to the L x 5 reps with RLE in squat position.  Pt able to do with intermittent UE support therefore progressed to standing on more compliant green balance disc without RLE squat performing single tap to the L and return to midline with UE support on ball on mat.  Pt requires heavier assist with UEs but is able to complete.  R ankle in stance on incline board tapping LLE cone forward x 10 reps with light UE support as needed.       Exercises   Exercises  Other Exercises    Other Exercises   RLE strengthening and core strengthening:  Performed RLE strengthening with tall kneeling to L half kneeling x 10 reps without UE as able, R half kneel to stand x 5 reps with UE support as needed, squats to 15" target (with foam pad placed on block) x 8 reps (pt had reported squatting over the weekend and unable to get back up without increased UE support).  Reviewed modified plank on elbows elevating hips only x 5 rpes with 5 sec hold.  This seemed somewhat easier than  last assessed therefore had her do full plank on elbows x 5 reps with 5 sec hold and updated on HEP.  Modified side plank x 5 reps on each side with moderate difficulty and cues for technique.               PT Education - 11/20/17 1556    Education Details  updated HEP    Person(s) Educated  Patient    Methods  Explanation    Comprehension  Verbalized understanding       PT Short Term Goals - 11/15/17 0938      PT SHORT TERM GOAL #1   Title  Pt will be independent with initial HEP in order to indicate improved functional mobility and decreased fall risk.     Baseline  11/15/17: met today    Time  4  Period  Weeks    Status  Achieved      PT SHORT TERM GOAL #2   Title  Pt will improve 5TSS to </=11 secs without UE support in order to indicate decreased fall risk and improved functional strength.      Baseline  11/15/17: Met today; 10.25 sec from chair without UE support    Time  4    Period  Weeks    Status  Achieved      PT SHORT TERM GOAL #3   Title  Pt will improve FGA to >/= 24/30 in order to indicate decreased fall risk.      Baseline  11/15/17: Met today; 25/30    Time  4    Period  Weeks    Status  Achieved      PT SHORT TERM GOAL #4   Title  Pt will ambulate at gait speed of >/=3.6 ft/sec with decreased indication of R foot slap/overt gait deviations indicating safe gait speed.     Baseline  11/15/17: Met today; 4.73 ft/sec    Time  4    Period  Weeks    Status  Achieved      PT SHORT TERM GOAL #5   Title  Pt will tolerate set up and use of Bioness functional electrical stimulation for DF assist on RLE    Baseline  Bioness set up and patient can tolerate at low intensity    Time  4    Period  Weeks    Status  Achieved        PT Long Term Goals - 10/11/17 2145      PT LONG TERM GOAL #1   Title  Pt will be independent with final HEP in order to indicate improved functional mobility and decreased fall risk.     Time  8    Period  Weeks    Status   New    Target Date  12/10/17      PT LONG TERM GOAL #2   Title  Pt will improve FGA to >/=27/30 in order to indicate decreased fall risk.      Time  8    Period  Weeks    Status  New    Target Date  12/10/17      PT LONG TERM GOAL #3   Title  Pt will improve gait velocity to >/= 3.8 ft/sec with decreased evidence of R foot drop and genu recurvatum to decrease falls risk in community    Time  8    Period  Weeks    Status  New    Target Date  12/10/17      PT LONG TERM GOAL #4   Title  Pt will ambulate >1000' over varying outdoor surfaces with decreased incidence of R foot slap/drag and increased foot clearance to indicate safer ambulation in community    Time  8    Period  Weeks    Status  New    Target Date  12/10/17      PT LONG TERM GOAL #5   Title  Pt will improve cervical rotation by 10 deg bilaterally in order to indicate improved safety with driving      Baseline  40 deg bilaterally    Time  8    Period  Weeks    Status  New    Target Date  12/10/17            Plan - 11/20/17  1557    Clinical Impression Statement  Skilled session focused on R ankle strengthening/NMR, RLE strengthening and core strengthening.  Pt making excellent progress with exercises, therefore updated them as needed.     PT Treatment/Interventions  ADLs/Self Care Home Management;Electrical Stimulation;Gait training;Stair training;Functional mobility training;Therapeutic activities;Therapeutic exercise;Balance training;Neuromuscular re-education;Patient/family education;Orthotic Fit/Training;Passive range of motion;Manual techniques;Aquatic Therapy;Moist Heat;Taping    PT Next Visit Plan  (MD said no to dry needling); continue to address balance and LE strengthening, Bioness to right LE as able with session for improved muscle strengthening.     Consulted and Agree with Plan of Care  Patient       Patient will benefit from skilled therapeutic intervention in order to improve the following  deficits and impairments:  Abnormal gait, Decreased balance, Decreased range of motion, Decreased strength, Hypomobility, Impaired flexibility, Postural dysfunction, Difficulty walking, Impaired sensation  Visit Diagnosis: Muscle weakness (generalized)  Unsteadiness on feet  Other abnormalities of gait and mobility  Foot drop, right     Problem List Patient Active Problem List   Diagnosis Date Noted  . History of GI diverticular bleed 09/11/2017  . Lower GI bleed   . Acute blood loss anemia   . Diverticulosis   . 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 Kidney transplant recipient 01/21/2017  . Anxiety 11/21/2016  . Stenosis of cervical spine with myelopathy ([place]) 08/12/2016  . Routine general medical examination at a health care facility 06/01/2016  . Hypothyroidism 03/24/2015  . History of tachycardia 03/24/2015  . HTN (hypertension), benign 06/12/2013  . ESRD on dialysis [facility]) 06/12/2013    Cameron Sprang, PT, MPT [facility] [address] [place], [place], [address] Phone: [phone]   Fax:  [phone] 11/20/17, 3:58 PM  Name: [name] MRN: [id number] Date of Birth: 17-Aug-1960

## 2017-11-22 ENCOUNTER — Ambulatory Visit: Payer: Medicare Other | Admitting: Rehabilitative and Restorative Service Providers"

## 2017-11-23 ENCOUNTER — Ambulatory Visit: Payer: Medicare Other | Admitting: Rehabilitative and Restorative Service Providers"

## 2017-11-23 ENCOUNTER — Encounter: Payer: Self-pay | Admitting: Rehabilitative and Restorative Service Providers"

## 2017-11-23 DIAGNOSIS — R2689 Other abnormalities of gait and mobility: Secondary | ICD-10-CM

## 2017-11-23 DIAGNOSIS — M21371 Foot drop, right foot: Secondary | ICD-10-CM | POA: Diagnosis not present

## 2017-11-23 DIAGNOSIS — R2681 Unsteadiness on feet: Secondary | ICD-10-CM | POA: Diagnosis not present

## 2017-11-23 DIAGNOSIS — M6281 Muscle weakness (generalized): Secondary | ICD-10-CM

## 2017-11-23 NOTE — Therapy (Signed)
[facility] [address] [place], [place], [address] Phone: [phone]   Fax:  [phone]  Physical Therapy Treatment  Patient Details  Name: [name] MRN: [id number] Date of Birth: 06-07-1960 Referring Provider (PT): Hoyt Koch, MD   Encounter Date: 11/23/2017  PT End of Session - 11/23/17 0929    Visit Number  14    Number of Visits  17    Date for PT Re-Evaluation  12/10/17    Authorization Type  Medicare - 10th visit PN    PT Start Time  0846    PT Stop Time  0929    PT Time Calculation (min)  43 min    Activity Tolerance  Patient tolerated treatment well;No increased pain    Behavior During Therapy  WFL for tasks assessed/performed       Past Medical History:  Diagnosis Date  . Acute blood loss anemia   . Anemia   . Anxiety   . Arthritis   . Claustrophobia   . Diverticulosis   . ESRD (end stage renal disease) on dialysis ([facility])    "TTS; [facility] [place]" (03/24/2015)  . GI bleed   . Glomerulonephritis   . Heart murmur   . HTN (hypertension)   . Hypothyroidism   . Kidney transplant recipient 01/21/2017  . Pancreatitis   . Renal insufficiency   . Secondary hyperparathyroidism ([facility])    [name] 05/11/2014    Past Surgical History:  Procedure Laterality Date  . ANTERIOR CERVICAL CORPECTOMY N/A 05/24/2017   Procedure: CORPECTOMY CERVICAL FIVE- CERVICAL SIX;  Surgeon: Ashok Pall, MD;  Location: [facility];  Service: Neurosurgery;  Laterality: N/A;  . ARTERIOVENOUS GRAFT PLACEMENT Right 1990's?  . ARTERIOVENOUS GRAFT PLACEMENT Left 07/2002   upper arm/notes 06/02/2010  . ARTERIOVENOUS GRAFT PLACEMENT Right 07/2003   upper arm/notes 06/02/2010  . AV FISTULA PLACEMENT Left 09/2000   upper arm/notes 06/02/2010  . BREAST BIOPSY Left unsure   benign  . COLONOSCOPY    . DILATATION & CURRETTAGE/HYSTEROSCOPY WITH RESECTOCOPE N/A 04/17/2013   Procedure: DILATATION & CURETTAGE/HYSTEROSCOPY WITH  RESECTOCOPE;  Surgeon: Marvene Staff, MD;  Location: [facility];  Service: Gynecology;  Laterality: N/A;  . DILATION AND CURETTAGE OF UTERUS    . EYE SURGERY    . KIDNEY TRANSPLANT  1997  . KIDNEY TRANSPLANT  01/21/2017   Dr. Harrington Challenger  . PARATHYROIDECTOMY  03/2000 05/12/2014   w/neck exploration & autotransplantation/notes 06/02/2010; w/neck exploration  . PARATHYROIDECTOMY N/A 05/12/2014   Procedure: PARATHYROIDECTOMY AND NECK EXPLORATION;  Surgeon: Armandina Gemma, MD;  Location: [facility];  Service: General;  Laterality: N/A;  . PERITONEAL CATHETER INSERTION    . PERITONEAL CATHETER REMOVAL  08/1999   [name] 06/02/2010  . POSTERIOR CERVICAL FUSION/FORAMINOTOMY N/A 05/30/2017   Procedure: POSTERIOR CERVICAL Arthrodesis Cervical three - four, cervical four - five, cervical five - six, cervical six - seven;  Surgeon: Ashok Pall, MD;  Location: [facility];  Service: Neurosurgery;  Laterality: N/A;  . POSTERIOR CERVICAL LAMINECTOMY N/A 08/12/2016   Procedure: POSTERIOR CERVICAL LAMINECTOMY CERVICAL THREE CERVICAL FOUR, CERVICAL FOUR CERVICAL FIVE, CERVICAL FIVE- CERVICAL SIX, CERVICAL SIX- CERVICAL SEVEN;  Surgeon: Ashok Pall, MD;  Location: [facility];  Service: Neurosurgery;  Laterality: N/A;  POSTERIOR  . RETINAL DETACHMENT SURGERY Left   . THROMBECTOMY Left 02/2002   fistula/notes 06/02/2010  . THROMBECTOMY / ARTERIOVENOUS GRAFT REVISION  11/2003; 01/2004; 08/07/2005; 08/10/2005; 10/2005   [name] 06/02/2010  . THROMBECTOMY AND REVISION OF  ARTERIOVENTOUS (AV) GORETEX  GRAFT Right 01/2002; 11/2003; 01/2004; 08/07/2004; 08/10/2004; 11/13/2005   [name] 5/1/2012Marland Kitchen [name] 5/16/2012Marland Kitchen [name] 5/16/2012Marland Kitchen [name] 5/16/2012Marland Kitchen [name] 5/16/2012Marland Kitchen [name] 06/02/2010  . THROMBECTOMY AND REVISION OF ARTERIOVENTOUS (AV) GORETEX  GRAFT Left 08/23/2002; 09/13/2002; 07/08/2003   [name] 06/02/2010; [name] 06/02/2010; [name] 06/02/2010    There were no vitals filed for this visit.  Subjective Assessment - 11/23/17 0849    Subjective  The patient  notes strengthening for squatting and getting back up.  She continues with neck tightness.  She is tolerating her new shoes for longer periods now.     Pertinent History  lower GI bleed due to diverticulitis, pancreatitis, heart murmur, hypothyroidism, HTN, stenosis of cervical spine with myelopathy with posterior fusion/foraminotomy, anterior cervical corpectomy, posterior cervical laminectomy, parathyroidectomy, ESRD with kidney transplant in January of 2019, anxiety, and anemia    Patient Stated Goals  Focus on core strength, neck and balance    Currently in Pain?  No/denies                       [facility] Adult PT Treatment/Exercise - 11/23/17 0851      Ambulation/Gait   Ambulation/Gait  Yes    Ambulation/Gait Assistance  6: Modified independent (Device/Increase time)    Ambulation/Gait Assistance Details  Patient continues with R trendelenberg gait and occasional R foot slap when fatiguing.    Ambulation Distance (Feet)  300 Feet   230 x 3   Assistive device  None    Gait Pattern  Step-through pattern;Poor foot clearance - right;Decreased hip/knee flexion - right;Decreased hip/knee flexion - left;Decreased dorsiflexion - right;Decreased dorsiflexion - left;Decreased stance time - right;Decreased weight shift to right    Ambulation Surface  Level;Indoor      Exercises   Exercises  Other Exercises    Other Exercises   Standing hip adductor stretch with hips flexed to 90 degrees, isometric lunges holding (warrior 2) x 3 reps R and L sides near UE support, lateral step ups adding hip abduction, R leg stance with L hip hiking x 10 reps with manual/tactile cues, R leg stance pressing L leg into wall.  Standing to 1/2 kneel x 5 reps each side with UE support bilaterally.  Seated> to 1/2 squats with 5 second isometric holds x 5 reps, then lifting R and L toes x 3 reps each.  Seated hamstring and circle sitting stretch.    Tall knee<>1/2 kneeling x 10 reps with UE support, hip flexor  stretch on floor in a lunging position using mat for support.               PT Short Term Goals - 11/15/17 0350      PT SHORT TERM GOAL #1   Title  Pt will be independent with initial HEP in order to indicate improved functional mobility and decreased fall risk.     Baseline  11/15/17: met today    Time  4    Period  Weeks    Status  Achieved      PT SHORT TERM GOAL #2   Title  Pt will improve 5TSS to </=11 secs without UE support in order to indicate decreased fall risk and improved functional strength.      Baseline  11/15/17: Met today; 10.25 sec from chair without UE support    Time  4    Period  Weeks    Status  Achieved      PT SHORT TERM GOAL #3  Title  Pt will improve FGA to >/= 24/30 in order to indicate decreased fall risk.      Baseline  11/15/17: Met today; 25/30    Time  4    Period  Weeks    Status  Achieved      PT SHORT TERM GOAL #4   Title  Pt will ambulate at gait speed of >/=3.6 ft/sec with decreased indication of R foot slap/overt gait deviations indicating safe gait speed.     Baseline  11/15/17: Met today; 4.73 ft/sec    Time  4    Period  Weeks    Status  Achieved      PT SHORT TERM GOAL #5   Title  Pt will tolerate set up and use of Bioness functional electrical stimulation for DF assist on RLE    Baseline  Bioness set up and patient can tolerate at low intensity    Time  4    Period  Weeks    Status  Achieved        PT Long Term Goals - 10/11/17 2145      PT LONG TERM GOAL #1   Title  Pt will be independent with final HEP in order to indicate improved functional mobility and decreased fall risk.     Time  8    Period  Weeks    Status  New    Target Date  12/10/17      PT LONG TERM GOAL #2   Title  Pt will improve FGA to >/=27/30 in order to indicate decreased fall risk.      Time  8    Period  Weeks    Status  New    Target Date  12/10/17      PT LONG TERM GOAL #3   Title  Pt will improve gait velocity to >/= 3.8 ft/sec  with decreased evidence of R foot drop and genu recurvatum to decrease falls risk in community    Time  8    Period  Weeks    Status  New    Target Date  12/10/17      PT LONG TERM GOAL #4   Title  Pt will ambulate >1000' over varying outdoor surfaces with decreased incidence of R foot slap/drag and increased foot clearance to indicate safer ambulation in community    Time  8    Period  Weeks    Status  New    Target Date  12/10/17      PT LONG TERM GOAL #5   Title  Pt will improve cervical rotation by 10 deg bilaterally in order to indicate improved safety with driving      Baseline  40 deg bilaterally    Time  8    Period  Weeks    Status  New    Target Date  12/10/17            Plan - 11/23/17 0929    Clinical Impression Statement  PT focused on R leg stance activities, R hip stabilization and general strength/conditioning.  She is continuing to progress with mobility and compliant with HEP.  Plan to begin to check LTGs to determine plan as goals due 12/10/17 and last visit scheuled for 12/06/17.    PT Treatment/Interventions  ADLs/Self Care Home Management;Electrical Stimulation;Gait training;Stair training;Functional mobility training;Therapeutic activities;Therapeutic exercise;Balance training;Neuromuscular re-education;Patient/family education;Orthotic Fit/Training;Passive range of motion;Manual techniques;Aquatic Therapy;Moist Heat;Taping    PT Next Visit Plan  CHECK LONG TERM  GOALS (begin to determine if we need d/c versus renewal on 12/10/17); continue to address balance and LE strengthening, Bioness to right LE as able with session for improved muscle strengthening.  *MD SAID NO TO DRY NEEDLING.    Consulted and Agree with Plan of Care  Patient       Patient will benefit from skilled therapeutic intervention in order to improve the following deficits and impairments:  Abnormal gait, Decreased balance, Decreased range of motion, Decreased strength, Hypomobility, Impaired  flexibility, Postural dysfunction, Difficulty walking, Impaired sensation  Visit Diagnosis: Unsteadiness on feet  Muscle weakness (generalized)  Other abnormalities of gait and mobility     Problem List Patient Active Problem List   Diagnosis Date Noted  . History of GI diverticular bleed 09/11/2017  . Lower GI bleed   . Acute blood loss anemia   . Diverticulosis   . 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 Kidney transplant recipient 01/21/2017  . Anxiety 11/21/2016  . Stenosis of cervical spine with myelopathy ([facility]) 08/12/2016  . Routine general medical examination at a health care facility 06/01/2016  . Hypothyroidism 03/24/2015  . History of tachycardia 03/24/2015  . HTN (hypertension), benign 06/12/2013  . ESRD on dialysis [facility]) 06/12/2013    [place], PT 11/23/2017, 9:31 AM  [facility] [address] [place], [place], [address] Phone: [phone]   Fax:  [phone]  Name: [name] MRN: [id number] Date of Birth: Mar 13, 1960

## 2017-11-27 ENCOUNTER — Encounter: Payer: Self-pay | Admitting: Physical Therapy

## 2017-11-27 ENCOUNTER — Ambulatory Visit: Payer: Medicare Other | Admitting: Physical Therapy

## 2017-11-27 DIAGNOSIS — M6281 Muscle weakness (generalized): Secondary | ICD-10-CM

## 2017-11-27 DIAGNOSIS — R2689 Other abnormalities of gait and mobility: Secondary | ICD-10-CM | POA: Diagnosis not present

## 2017-11-27 DIAGNOSIS — M21371 Foot drop, right foot: Secondary | ICD-10-CM | POA: Diagnosis not present

## 2017-11-27 DIAGNOSIS — R2681 Unsteadiness on feet: Secondary | ICD-10-CM | POA: Diagnosis not present

## 2017-11-27 NOTE — Therapy (Signed)
[facility] [address] [place], [place], [address] Phone: [phone]   Fax:  [phone]  Physical Therapy Treatment  Patient Details  Name: [name] MRN: [id number] Date of Birth: [birth year] Referring Provider (PT): Hoyt Koch, MD   Encounter Date: 11/27/2017  PT End of Session - 11/27/17 1220    Visit Number  15    Number of Visits  17    Date for PT Re-Evaluation  12/10/17    Authorization Type  Medicare - 10th visit PN    PT Start Time  0930    PT Stop Time  1015    PT Time Calculation (min)  45 min    Activity Tolerance  Patient tolerated treatment well;No increased pain    Behavior During Therapy  WFL for tasks assessed/performed       Past Medical History:  Diagnosis Date  . Acute blood loss anemia   . Anemia   . Anxiety   . Arthritis   . Claustrophobia   . Diverticulosis   . ESRD (end stage renal disease) on dialysis ([place])    "TTS; [facility]" (03/24/2015)  . GI bleed   . Glomerulonephritis   . Heart murmur   . HTN (hypertension)   . Hypothyroidism   . Kidney transplant recipient 01/21/2017  . Pancreatitis   . Renal insufficiency   . Secondary hyperparathyroidism ([facility])    [name] 05/11/2014    Past Surgical History:  Procedure Laterality Date  . ANTERIOR CERVICAL CORPECTOMY N/A 05/24/2017   Procedure: CORPECTOMY CERVICAL FIVE- CERVICAL SIX;  Surgeon: Ashok Pall, MD;  Location: [facility];  Service: Neurosurgery;  Laterality: N/A;  . ARTERIOVENOUS GRAFT PLACEMENT Right 1990's?  . ARTERIOVENOUS GRAFT PLACEMENT Left 07/2002   upper arm/notes 06/02/2010  . ARTERIOVENOUS GRAFT PLACEMENT Right 07/2003   upper arm/notes 06/02/2010  . AV FISTULA PLACEMENT Left 09/2000   upper arm/notes 06/02/2010  . BREAST BIOPSY Left unsure   benign  . COLONOSCOPY    . DILATATION & CURRETTAGE/HYSTEROSCOPY WITH RESECTOCOPE N/A 04/17/2013   Procedure: DILATATION & CURETTAGE/HYSTEROSCOPY WITH  RESECTOCOPE;  Surgeon: Marvene Staff, MD;  Location: [facility];  Service: Gynecology;  Laterality: N/A;  . DILATION AND CURETTAGE OF UTERUS    . EYE SURGERY    . KIDNEY TRANSPLANT  1997  . KIDNEY TRANSPLANT  01/21/2017   Dr. Harrington Challenger  . PARATHYROIDECTOMY  03/2000 05/12/2014   w/neck exploration & autotransplantation/notes 06/02/2010; w/neck exploration  . PARATHYROIDECTOMY N/A 05/12/2014   Procedure: PARATHYROIDECTOMY AND NECK EXPLORATION;  Surgeon: Armandina Gemma, MD;  Location: [facility];  Service: General;  Laterality: N/A;  . PERITONEAL CATHETER INSERTION    . PERITONEAL CATHETER REMOVAL  08/1999   [name] 06/02/2010  . POSTERIOR CERVICAL FUSION/FORAMINOTOMY N/A 05/30/2017   Procedure: POSTERIOR CERVICAL Arthrodesis Cervical three - four, cervical four - five, cervical five - six, cervical six - seven;  Surgeon: Ashok Pall, MD;  Location: [facility];  Service: Neurosurgery;  Laterality: N/A;  . POSTERIOR CERVICAL LAMINECTOMY N/A 08/12/2016   Procedure: POSTERIOR CERVICAL LAMINECTOMY CERVICAL THREE CERVICAL FOUR, CERVICAL FOUR CERVICAL FIVE, CERVICAL FIVE- CERVICAL SIX, CERVICAL SIX- CERVICAL SEVEN;  Surgeon: Ashok Pall, MD;  Location: [facility];  Service: Neurosurgery;  Laterality: N/A;  POSTERIOR  . RETINAL DETACHMENT SURGERY Left   . THROMBECTOMY Left 02/2002   fistula/notes 06/02/2010  . THROMBECTOMY / ARTERIOVENOUS GRAFT REVISION  11/2003; 01/2004; 08/07/2005; 08/10/2005; 10/2005   [name] 06/02/2010  . THROMBECTOMY AND REVISION OF  ARTERIOVENTOUS (AV) GORETEX  GRAFT Right 01/2002; 11/2003; 01/2004; 08/07/2004; 08/10/2004; 11/13/2005   [name] 5/1/2012Marland Kitchen [name] 5/16/2012Marland Kitchen [name] 5/16/2012Marland Kitchen [name] 5/16/2012Marland Kitchen [name] 5/16/2012Marland Kitchen [name] 06/02/2010  . THROMBECTOMY AND REVISION OF ARTERIOVENTOUS (AV) GORETEX  GRAFT Left 08/23/2002; 09/13/2002; 07/08/2003   [name] 06/02/2010; [name] 06/02/2010; [name] 06/02/2010    There were no vitals filed for this visit.  Subjective Assessment - 11/27/17 0936    Subjective  Reports her new  shoes are preventing her ankles from rolling; however, she does not like having to wear sneakers. Reports she is able to walk further distances before fatigue onset. Reports her HEP exercises are going well.     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  When she first stands up and starts walking, R foot drags    Patient Stated Goals  Focus on core strength, neck and balance    Currently in Pain?  No/denies         [facility] PT Assessment - 11/27/17 1215      AROM   Cervical - Right Rotation  48    Cervical - Left Rotation  56      Standardized Balance Assessment   10 Meter Walk  4.58 ft/sec      Functional Gait  Assessment   Gait assessed   Yes    Gait Level Surface  Walks 20 ft in less than 5.5 sec, no assistive devices, good speed, no evidence for imbalance, normal gait pattern, deviates no more than 6 in outside of the 12 in walkway width.    Change in Gait Speed  Able to smoothly change walking speed without loss of balance or gait deviation. Deviate no more than 6 in outside of the 12 in walkway width.    Gait with Horizontal Head Turns  Performs head turns smoothly with no change in gait. Deviates no more than 6 in outside 12 in walkway width    Gait with Vertical Head Turns  Performs head turns with no change in gait. Deviates no more than 6 in outside 12 in walkway width.    Gait and Pivot Turn  Pivot turns safely within 3 sec and stops quickly with no loss of balance.    Step Over Obstacle  Is able to step over 2 stacked shoe boxes taped together (9 in total height) without changing gait speed. No evidence of  imbalance.    Gait with Narrow Base of Support  Ambulates 4-7 steps.    Gait with Eyes Closed  Walks 20 ft, no assistive devices, good speed, no evidence of imbalance, normal gait pattern, deviates no more than 6 in outside 12 in walkway width. Ambulates 20 ft in less than 7 sec.    Ambulating Backwards  Walks 20 ft, uses assistive device, slower speed, mild gait deviations, deviates 6-10 in outside 12 in walkway width.    Steps  Alternating feet, no rail.    Total Score  27    FGA comment:  27/30                   [facility] Adult PT Treatment/Exercise - 11/27/17 1215      Ambulation/Gait   Ambulation/Gait  Yes  Ambulation/Gait Assistance  6: Modified independent (Device/Increase time)    Ambulation/Gait Assistance Details  Patient ambulates outdoors navigating uneven terrain, curbs, ramps, and demonstrates ability to vary gait speed at mod I level.     Ambulation Distance (Feet)  1000 Feet    Assistive device  None    Gait Pattern  Step-through pattern;Poor foot clearance - right;Decreased dorsiflexion - right;Decreased stance time - right    Ambulation Surface  Outdoor;Paved;Gravel;Grass;Unlevel    Stairs  Yes    Stairs Assistance  6: Modified independent (Device/Increase time)    Stair Management Technique  No rails;Alternating pattern   2nd trial: Single rail with reciprocal stepping technique   Number of Stairs  4   x2 trials   Height of Stairs  6    Ramp  6: Modified independent (Device)    Curb  6: Modified independent (Device/increase time)      Dynamic Standing Balance   Dynamic Standing - Comments  Pt performs dynamic standing balance on bosu ball in // bars for intermittant UE assist for steadying. Pt performs each for 30 sec duration; feet apart eyes open; feet apart eyes closed; feet apart horizontal head turns; feet apart vertical head turns; feet apart eyes closed. Pt then performs UE movements holding 4# ball for increased challenge progressing to holding 4#  ball while performing mini squats for 10 reps.              PT Education - 11/27/17 1220    Education Details  Therapist provided education regarding clinical findings from long term goal reassessment and plan of care moving forward with upcoming discharge.    Person(s) Educated  Patient    Methods  Explanation    Comprehension  Verbalized understanding       PT Short Term Goals - 11/15/17 0938      PT SHORT TERM GOAL #1   Title  Pt will be independent with initial HEP in order to indicate improved functional mobility and decreased fall risk.     Baseline  11/15/17: met today    Time  4    Period  Weeks    Status  Achieved      PT SHORT TERM GOAL #2   Title  Pt will improve 5TSS to </=11 secs without UE support in order to indicate decreased fall risk and improved functional strength.      Baseline  11/15/17: Met today; 10.25 sec from chair without UE support    Time  4    Period  Weeks    Status  Achieved      PT SHORT TERM GOAL #3   Title  Pt will improve FGA to >/= 24/30 in order to indicate decreased fall risk.      Baseline  11/15/17: Met today; 25/30    Time  4    Period  Weeks    Status  Achieved      PT SHORT TERM GOAL #4   Title  Pt will ambulate at gait speed of >/=3.6 ft/sec with decreased indication of R foot slap/overt gait deviations indicating safe gait speed.     Baseline  11/15/17: Met today; 4.73 ft/sec    Time  4    Period  Weeks    Status  Achieved      PT SHORT TERM GOAL #5   Title  Pt will tolerate set up and use of Bioness functional electrical stimulation for DF assist on RLE    Baseline  Bioness set up and patient can tolerate at low intensity    Time  4    Period  Weeks    Status  Achieved        PT Long Term Goals - 11/27/17 1211      PT LONG TERM GOAL #1   Title  Pt will be independent with final HEP in order to indicate improved functional mobility and decreased fall risk.     Time  8    Period  Weeks    Status  On-going       PT LONG TERM GOAL #2   Title  Pt will improve FGA to >/=27/30 in order to indicate decreased fall risk.      Baseline  11/11: 27/30 indicating reduced fall risk potential     Time  8    Period  Weeks    Status  Achieved      PT LONG TERM GOAL #3   Title  Pt will improve gait velocity to >/= 3.8 ft/sec with decreased evidence of R foot drop and genu recurvatum to decrease falls risk in community    Baseline  11/11: 4.58 ft/sec indicative of normal walking speed    Time  8    Period  Weeks    Status  Achieved      PT LONG TERM GOAL #4   Title  Pt will ambulate >1000' over varying outdoor surfaces with decreased incidence of R foot slap/drag and increased foot clearance to indicate safer ambulation in community    Time  8    Period  Weeks    Status  Achieved      PT LONG TERM GOAL #5   Title  Pt will improve cervical rotation by 10 deg bilaterally in order to indicate improved safety with driving      Baseline  11/11: R cervical rotation is 48 deg; L cervical rotation is 56 degrees indicating patient has partially met goal    Time  8    Period  Weeks    Status  Partially Met            Plan - 11/27/17 1221    Clinical Impression Statement  Skilled session focused on reassessing patient long term goals and dynamic standing balance on compliant surfaces. Pt tolerated session well and overall met 3/5 LTG's, partially met 1/5 LTG's and her HEP goal will be assessed at d/c session. Pt demonstrates significant improvement in gait speed indicated by 4.58 ft/sec. Pt's FGA score has improved to 27/30 indicating reduced fall risk potential with functional mobility. Pt demonstrates ability to ambulate community distances with improved foot clearance bilaterally and demonstrates ability to ambulate over various surfaces with no LOB episodes. Pt has improved L cervical rotation to goal level and R cervical rotation has improved to 48 degrees; however, not to goal level. Patient's HEP goal will  be assessed during her next session with plans to be d/c from physical therapy.     Rehab Potential  Good    Clinical Impairments Affecting Rehab Potential  severity of cord compression     PT Frequency  2x / week    PT Duration  8 weeks    PT Treatment/Interventions  ADLs/Self Care Home Management;Electrical Stimulation;Gait training;Stair training;Functional mobility training;Therapeutic activities;Therapeutic exercise;Balance training;Neuromuscular re-education;Patient/family education;Orthotic Fit/Training;Passive range of motion;Manual techniques;Aquatic Therapy;Moist Heat;Taping    PT Next Visit Plan  HEP and D/C next visit.  Cancel remaining visits    PT Home Exercise Plan  [email or web address]     Consulted and Agree with Plan of Care  Patient       Patient will benefit from skilled therapeutic intervention in order to improve the following deficits and impairments:  Abnormal gait, Decreased balance, Decreased range of motion, Decreased strength, Hypomobility, Impaired flexibility, Postural dysfunction, Difficulty walking, Impaired sensation  Visit Diagnosis: Unsteadiness on feet  Muscle weakness (generalized)  Other abnormalities of gait and mobility     Problem List Patient Active Problem List   Diagnosis Date Noted  . History of GI diverticular bleed 09/11/2017  . Lower GI bleed   . Acute blood loss anemia   . Diverticulosis   . 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 Kidney transplant recipient 01/21/2017  . Anxiety 11/21/2016  . Stenosis of cervical spine with myelopathy ([place]) 08/12/2016  . Routine general medical examination at a health care facility 06/01/2016  . Hypothyroidism 03/24/2015  . History of tachycardia 03/24/2015  . HTN (hypertension), benign 06/12/2013  . ESRD on dialysis [facility]) 06/12/2013    Floreen Comber, SPT 11/27/2017, 12:26 PM  [facility] [address] [place], [place], [address] Phone:  [phone]   Fax:  [phone]  Name: [name] MRN: [id number] Date of Birth: [birth year]

## 2017-11-29 ENCOUNTER — Ambulatory Visit: Payer: Medicare Other | Admitting: Physical Therapy

## 2017-11-29 DIAGNOSIS — I151 Hypertension secondary to other renal disorders: Secondary | ICD-10-CM | POA: Diagnosis not present

## 2017-11-29 DIAGNOSIS — E872 Acidosis: Secondary | ICD-10-CM | POA: Diagnosis not present

## 2017-11-29 DIAGNOSIS — R760 Raised antibody titer: Secondary | ICD-10-CM | POA: Diagnosis not present

## 2017-11-29 DIAGNOSIS — Z4822 Encounter for aftercare following kidney transplant: Secondary | ICD-10-CM | POA: Diagnosis not present

## 2017-11-29 DIAGNOSIS — K922 Gastrointestinal hemorrhage, unspecified: Secondary | ICD-10-CM | POA: Diagnosis not present

## 2017-11-29 DIAGNOSIS — T8691 Unspecified transplanted organ and tissue rejection: Secondary | ICD-10-CM | POA: Diagnosis not present

## 2017-11-29 DIAGNOSIS — I471 Supraventricular tachycardia: Secondary | ICD-10-CM | POA: Diagnosis not present

## 2017-11-29 DIAGNOSIS — D899 Disorder involving the immune mechanism, unspecified: Secondary | ICD-10-CM | POA: Diagnosis not present

## 2017-11-29 DIAGNOSIS — Z5181 Encounter for therapeutic drug level monitoring: Secondary | ICD-10-CM | POA: Diagnosis not present

## 2017-11-29 DIAGNOSIS — K859 Acute pancreatitis without necrosis or infection, unspecified: Secondary | ICD-10-CM | POA: Diagnosis not present

## 2017-11-29 DIAGNOSIS — J449 Chronic obstructive pulmonary disease, unspecified: Secondary | ICD-10-CM | POA: Diagnosis not present

## 2017-11-29 DIAGNOSIS — Z87891 Personal history of nicotine dependence: Secondary | ICD-10-CM | POA: Diagnosis not present

## 2017-11-29 DIAGNOSIS — M542 Cervicalgia: Secondary | ICD-10-CM | POA: Diagnosis not present

## 2017-11-29 DIAGNOSIS — I10 Essential (primary) hypertension: Secondary | ICD-10-CM | POA: Diagnosis not present

## 2017-11-29 DIAGNOSIS — Z79899 Other long term (current) drug therapy: Secondary | ICD-10-CM | POA: Diagnosis not present

## 2017-12-01 ENCOUNTER — Ambulatory Visit: Payer: Medicare Other | Admitting: Physical Therapy

## 2017-12-01 ENCOUNTER — Encounter: Payer: Self-pay | Admitting: Physical Therapy

## 2017-12-01 DIAGNOSIS — R2681 Unsteadiness on feet: Secondary | ICD-10-CM | POA: Diagnosis not present

## 2017-12-01 DIAGNOSIS — R2689 Other abnormalities of gait and mobility: Secondary | ICD-10-CM

## 2017-12-01 DIAGNOSIS — M6281 Muscle weakness (generalized): Secondary | ICD-10-CM | POA: Diagnosis not present

## 2017-12-01 DIAGNOSIS — M21371 Foot drop, right foot: Secondary | ICD-10-CM | POA: Diagnosis not present

## 2017-12-01 NOTE — Therapy (Addendum)
[facility] [address] [place], [place], [address] Phone: [phone]   Fax:  [phone]  Physical Therapy Treatment and D/C Summary  Patient Details  Name: [name] MRN: [id number] Date of Birth: [birth year] Referring Provider (PT): Hoyt Koch, MD   Encounter Date: 12/01/2017  PT End of Session - 12/01/17 0843    Visit Number  16    Number of Visits  17    Date for PT Re-Evaluation  12/10/17    Authorization Type  Medicare - 10th visit PN    PT Start Time  0803    PT Stop Time  0845    PT Time Calculation (min)  42 min    Activity Tolerance  Patient tolerated treatment well;No increased pain    Behavior During Therapy  WFL for tasks assessed/performed       Past Medical History:  Diagnosis Date  . Acute blood loss anemia   . Anemia   . Anxiety   . Arthritis   . Claustrophobia   . Diverticulosis   . ESRD (end stage renal disease) on dialysis ([place])    "TTS; [place]" (03/24/2015)  . GI bleed   . Glomerulonephritis   . Heart murmur   . HTN (hypertension)   . Hypothyroidism   . Kidney transplant recipient 01/21/2017  . Pancreatitis   . Renal insufficiency   . Secondary hyperparathyroidism ([place])    [name] 05/11/2014    Past Surgical History:  Procedure Laterality Date  . ANTERIOR CERVICAL CORPECTOMY N/A 05/24/2017   Procedure: CORPECTOMY CERVICAL FIVE- CERVICAL SIX;  Surgeon: Ashok Pall, MD;  Location: [facility];  Service: Neurosurgery;  Laterality: N/A;  . ARTERIOVENOUS GRAFT PLACEMENT Right 1990's?  . ARTERIOVENOUS GRAFT PLACEMENT Left 07/2002   upper arm/notes 06/02/2010  . ARTERIOVENOUS GRAFT PLACEMENT Right 07/2003   upper arm/notes 06/02/2010  . AV FISTULA PLACEMENT Left 09/2000   upper arm/notes 06/02/2010  . BREAST BIOPSY Left unsure   benign  . COLONOSCOPY    . DILATATION & CURRETTAGE/HYSTEROSCOPY WITH RESECTOCOPE N/A 04/17/2013   Procedure: DILATATION &  CURETTAGE/HYSTEROSCOPY WITH RESECTOCOPE;  Surgeon: Marvene Staff, MD;  Location: [facility];  Service: Gynecology;  Laterality: N/A;  . DILATION AND CURETTAGE OF UTERUS    . EYE SURGERY    . KIDNEY TRANSPLANT  1997  . KIDNEY TRANSPLANT  01/21/2017   Dr. Harrington Challenger  . PARATHYROIDECTOMY  03/2000 05/12/2014   w/neck exploration & autotransplantation/notes 06/02/2010; w/neck exploration  . PARATHYROIDECTOMY N/A 05/12/2014   Procedure: PARATHYROIDECTOMY AND NECK EXPLORATION;  Surgeon: Armandina Gemma, MD;  Location: [facility];  Service: General;  Laterality: N/A;  . PERITONEAL CATHETER INSERTION    . PERITONEAL CATHETER REMOVAL  08/1999   [name] 06/02/2010  . POSTERIOR CERVICAL FUSION/FORAMINOTOMY N/A 05/30/2017   Procedure: POSTERIOR CERVICAL Arthrodesis Cervical three - four, cervical four - five, cervical five - six, cervical six - seven;  Surgeon: Ashok Pall, MD;  Location: [facility];  Service: Neurosurgery;  Laterality: N/A;  . POSTERIOR CERVICAL LAMINECTOMY N/A 08/12/2016   Procedure: POSTERIOR CERVICAL LAMINECTOMY CERVICAL THREE CERVICAL FOUR, CERVICAL FOUR CERVICAL FIVE, CERVICAL FIVE- CERVICAL SIX, CERVICAL SIX- CERVICAL SEVEN;  Surgeon: Ashok Pall, MD;  Location: [facility];  Service: Neurosurgery;  Laterality: N/A;  POSTERIOR  . RETINAL DETACHMENT SURGERY Left   . THROMBECTOMY Left 02/2002   fistula/notes 06/02/2010  . THROMBECTOMY / ARTERIOVENOUS GRAFT REVISION  11/2003; 01/2004; 08/07/2005; 08/10/2005; 10/2005   [name] 06/02/2010  . THROMBECTOMY  AND REVISION OF ARTERIOVENTOUS (AV) GORETEX  GRAFT Right 01/2002; 11/2003; 01/2004; 08/07/2004; 08/10/2004; 11/13/2005   [name] 5/1/2012Marland Kitchen [name] 5/16/2012Marland Kitchen [name] 5/16/2012Marland Kitchen [name] 5/16/2012Marland Kitchen [name] 06/02/2010; [name] 06/02/2010  . THROMBECTOMY AND REVISION OF ARTERIOVENTOUS (AV) GORETEX  GRAFT Left 08/23/2002; 09/13/2002; 07/08/2003   [name] 06/02/2010; [name] 06/02/2010; [name] 06/02/2010    There were no vitals filed for this visit.  Subjective Assessment - 12/01/17 0807     Subjective  No new complaints. HEP exercises are still going good.     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  When she first stands up and starts walking, R foot drags    Patient Stated Goals  Focus on core strength, neck and balance    Currently in Pain?  No/denies          [facility] Adult PT Treatment/Exercise - 12/01/17 0905      Self-Care   Self-Care  Other Self-Care Comments    Other Self-Care Comments   Pt performed current HEP for strengthening and balance to assess appropriateness. Minimal verbal cues needed to correct exercise form for planks. Performed each one for 5-10 reps.         PT Education - 12/01/17 1848    Education Details  Pt to continue HEP    Person(s) Educated  Patient    Methods  Explanation;Demonstration;Handout;Verbal cues    Comprehension  Verbalized understanding;Returned demonstration           PT Short Term Goals - 11/15/17 0938      PT SHORT TERM GOAL #1   Title  Pt will be independent with initial HEP in order to indicate improved functional mobility and decreased fall risk.     Baseline  11/15/17: met today    Time  4    Period  Weeks    Status  Achieved      PT SHORT TERM GOAL #2   Title  Pt will improve 5TSS to </=11 secs without UE support in order to indicate decreased fall risk and improved functional strength.      Baseline  11/15/17: Met today; 10.25 sec from chair without UE support    Time  4    Period  Weeks    Status  Achieved      PT SHORT TERM GOAL #3   Title  Pt will improve FGA to >/= 24/30 in order to indicate decreased fall risk.      Baseline   11/15/17: Met today; 25/30    Time  4    Period  Weeks    Status  Achieved      PT SHORT TERM GOAL #4   Title  Pt will ambulate at gait speed of >/=3.6 ft/sec with decreased indication of R foot slap/overt gait deviations indicating safe gait speed.     Baseline  11/15/17: Met today; 4.73 ft/sec    Time  4    Period  Weeks    Status  Achieved      PT SHORT TERM GOAL #5   Title  Pt will tolerate set up and use of Bioness functional electrical stimulation for DF assist on RLE    Baseline  Bioness set up and patient can  tolerate at low intensity    Time  4    Period  Weeks    Status  Achieved        PT Long Term Goals - 12/01/17 2025      PT LONG TERM GOAL #1   Title  Pt will be independent with final HEP in order to indicate improved functional mobility and decreased fall risk.     Baseline  12/01/17: met today     Time  8    Period  Weeks    Status  Achieved      PT LONG TERM GOAL #2   Title  Pt will improve FGA to >/=27/30 in order to indicate decreased fall risk.      Baseline  11/11: 27/30 indicating reduced fall risk potential     Time  8    Period  Weeks    Status  Achieved      PT LONG TERM GOAL #3   Title  Pt will improve gait velocity to >/= 3.8 ft/sec with decreased evidence of R foot drop and genu recurvatum to decrease falls risk in community    Baseline  11/11: 4.58 ft/sec indicative of normal walking speed    Time  8    Period  Weeks    Status  Achieved      PT LONG TERM GOAL #4   Title  Pt will ambulate >1000' over varying outdoor surfaces with decreased incidence of R foot slap/drag and increased foot clearance to indicate safer ambulation in community    Time  8    Period  Weeks    Status  Achieved      PT LONG TERM GOAL #5   Title  Pt will improve cervical rotation by 10 deg bilaterally in order to indicate improved safety with driving      Baseline  11/11: R cervical rotation is 48 deg; L cervical rotation is 56 degrees indicating patient has  partially met goal    Time  8    Period  Weeks    Status  Partially Met            Plan - 12/01/17 0818    Clinical Impression Statement  Today's session focused on reviewing pt's HEP to ensure it is still appropriate and assessing independence with HEP with pt meeting goal today.     Rehab Potential  Good    Clinical Impairments Affecting Rehab Potential  severity of cord compression     PT Frequency  2x / week    PT Duration  8 weeks    PT Treatment/Interventions  ADLs/Self Care Home Management;Electrical Stimulation;Gait training;Stair training;Functional mobility training;Therapeutic activities;Therapeutic exercise;Balance training;Neuromuscular re-education;Patient/family education;Orthotic Fit/Training;Passive range of motion;Manual techniques;Aquatic Therapy;Moist Heat;Taping    PT Next Visit Plan  Pt D/C today.     PT Home Exercise Plan  [id number]     Consulted and Agree with Plan of Care  Patient       Patient will benefit from skilled therapeutic intervention in order to improve the following deficits and impairments:  Abnormal gait, Decreased balance, Decreased range of motion, Decreased strength, Hypomobility, Impaired flexibility, Postural dysfunction, Difficulty walking, Impaired sensation  Visit Diagnosis: Unsteadiness on feet  Muscle weakness (generalized)  Other abnormalities of gait and mobility     Problem List Patient Active Problem List   Diagnosis Date Noted  . History of GI diverticular bleed 09/11/2017  . Lower GI bleed   . Acute blood loss anemia   .  Diverticulosis   . 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 Kidney transplant recipient 01/21/2017  . Anxiety 11/21/2016  . Stenosis of cervical spine with myelopathy ([place]) 08/12/2016  . Routine general medical examination at a health care facility 06/01/2016  . Hypothyroidism 03/24/2015  . History of tachycardia 03/24/2015  . HTN (hypertension), benign 06/12/2013  . ESRD on dialysis ([place]) 06/12/2013     Cecile Sheerer, [place] 12/01/2017, 10:00 AM    PHYSICAL THERAPY DISCHARGE SUMMARY  Visits from Start of Care: 16  Current functional level related to goals / functional outcomes: Pt met 4/5 LTG and demonstrates improvement in balance and increase in gait velocity with decreased falls risk during ambulation over level and unlevel surfaces in the community.  Pt partially met cervical spine ROM goal with significant improvement in rotation to L, less improvement to R but pt is functional.   Remaining deficits: Decreased cervical spine ROM   Education / Equipment: HEP  Plan: Patient agrees to discharge.  Patient goals were met. Patient is being discharged due to meeting the stated rehab goals.  ?????    D/C written by: Rico Junker, PT, DPT 12/05/17    4:35 PM     [facility] [address] [place], [place], [address] Phone: [phone]   Fax:  [phone]  Name: [name] MRN: [id number] Date of Birth: Mar 26, 1960

## 2017-12-04 ENCOUNTER — Ambulatory Visit: Payer: Medicare Other | Admitting: Physical Therapy

## 2017-12-06 ENCOUNTER — Ambulatory Visit: Payer: Medicare Other | Admitting: Physical Therapy

## 2017-12-27 DIAGNOSIS — Z79899 Other long term (current) drug therapy: Secondary | ICD-10-CM | POA: Diagnosis not present

## 2017-12-27 DIAGNOSIS — Z87891 Personal history of nicotine dependence: Secondary | ICD-10-CM | POA: Diagnosis not present

## 2017-12-27 DIAGNOSIS — D631 Anemia in chronic kidney disease: Secondary | ICD-10-CM | POA: Diagnosis not present

## 2017-12-27 DIAGNOSIS — Z5181 Encounter for therapeutic drug level monitoring: Secondary | ICD-10-CM | POA: Diagnosis not present

## 2017-12-27 DIAGNOSIS — I471 Supraventricular tachycardia: Secondary | ICD-10-CM | POA: Diagnosis not present

## 2017-12-27 DIAGNOSIS — M542 Cervicalgia: Secondary | ICD-10-CM | POA: Diagnosis not present

## 2017-12-27 DIAGNOSIS — D899 Disorder involving the immune mechanism, unspecified: Secondary | ICD-10-CM | POA: Diagnosis not present

## 2017-12-27 DIAGNOSIS — R760 Raised antibody titer: Secondary | ICD-10-CM | POA: Diagnosis not present

## 2017-12-27 DIAGNOSIS — Z940 Kidney transplant status: Secondary | ICD-10-CM | POA: Diagnosis not present

## 2017-12-27 DIAGNOSIS — I151 Hypertension secondary to other renal disorders: Secondary | ICD-10-CM | POA: Diagnosis not present

## 2017-12-27 DIAGNOSIS — E872 Acidosis: Secondary | ICD-10-CM | POA: Diagnosis not present

## 2017-12-27 DIAGNOSIS — N183 Chronic kidney disease, stage 3 (moderate): Secondary | ICD-10-CM | POA: Diagnosis not present

## 2017-12-27 DIAGNOSIS — Z4822 Encounter for aftercare following kidney transplant: Secondary | ICD-10-CM | POA: Diagnosis not present

## 2017-12-27 DIAGNOSIS — T8611 Kidney transplant rejection: Secondary | ICD-10-CM | POA: Diagnosis not present

## 2018-01-24 DIAGNOSIS — Z79899 Other long term (current) drug therapy: Secondary | ICD-10-CM | POA: Diagnosis not present

## 2018-01-24 DIAGNOSIS — R300 Dysuria: Secondary | ICD-10-CM | POA: Diagnosis not present

## 2018-01-24 DIAGNOSIS — Z7982 Long term (current) use of aspirin: Secondary | ICD-10-CM | POA: Diagnosis not present

## 2018-01-24 DIAGNOSIS — Z9889 Other specified postprocedural states: Secondary | ICD-10-CM | POA: Diagnosis not present

## 2018-01-24 DIAGNOSIS — N390 Urinary tract infection, site not specified: Secondary | ICD-10-CM | POA: Diagnosis not present

## 2018-01-24 DIAGNOSIS — D631 Anemia in chronic kidney disease: Secondary | ICD-10-CM | POA: Diagnosis not present

## 2018-01-24 DIAGNOSIS — T827XXD Infection and inflammatory reaction due to other cardiac and vascular devices, implants and grafts, subsequent encounter: Secondary | ICD-10-CM | POA: Diagnosis not present

## 2018-01-24 DIAGNOSIS — Z992 Dependence on renal dialysis: Secondary | ICD-10-CM | POA: Diagnosis not present

## 2018-01-24 DIAGNOSIS — Z4822 Encounter for aftercare following kidney transplant: Secondary | ICD-10-CM | POA: Diagnosis not present

## 2018-01-24 DIAGNOSIS — E872 Acidosis: Secondary | ICD-10-CM | POA: Diagnosis not present

## 2018-01-24 DIAGNOSIS — Z940 Kidney transplant status: Secondary | ICD-10-CM | POA: Diagnosis not present

## 2018-01-24 DIAGNOSIS — T827XXA Infection and inflammatory reaction due to other cardiac and vascular devices, implants and grafts, initial encounter: Secondary | ICD-10-CM | POA: Diagnosis not present

## 2018-01-24 DIAGNOSIS — R229 Localized swelling, mass and lump, unspecified: Secondary | ICD-10-CM | POA: Diagnosis not present

## 2018-01-24 DIAGNOSIS — Z87891 Personal history of nicotine dependence: Secondary | ICD-10-CM | POA: Diagnosis not present

## 2018-01-24 DIAGNOSIS — T8691 Unspecified transplanted organ and tissue rejection: Secondary | ICD-10-CM | POA: Diagnosis not present

## 2018-01-24 DIAGNOSIS — Z5181 Encounter for therapeutic drug level monitoring: Secondary | ICD-10-CM | POA: Diagnosis not present

## 2018-01-24 DIAGNOSIS — I471 Supraventricular tachycardia: Secondary | ICD-10-CM | POA: Diagnosis not present

## 2018-01-24 DIAGNOSIS — I97191 Other postprocedural cardiac functional disturbances following other surgery: Secondary | ICD-10-CM | POA: Diagnosis not present

## 2018-01-24 DIAGNOSIS — Z95828 Presence of other vascular implants and grafts: Secondary | ICD-10-CM | POA: Diagnosis not present

## 2018-01-24 DIAGNOSIS — I10 Essential (primary) hypertension: Secondary | ICD-10-CM | POA: Diagnosis not present

## 2018-01-24 DIAGNOSIS — Z8719 Personal history of other diseases of the digestive system: Secondary | ICD-10-CM | POA: Diagnosis not present

## 2018-01-24 DIAGNOSIS — I770 Arteriovenous fistula, acquired: Secondary | ICD-10-CM | POA: Diagnosis not present

## 2018-01-26 DIAGNOSIS — I770 Arteriovenous fistula, acquired: Secondary | ICD-10-CM | POA: Diagnosis not present

## 2018-01-26 DIAGNOSIS — Z7952 Long term (current) use of systemic steroids: Secondary | ICD-10-CM | POA: Diagnosis not present

## 2018-01-26 DIAGNOSIS — T82590A Other mechanical complication of surgically created arteriovenous fistula, initial encounter: Secondary | ICD-10-CM | POA: Diagnosis not present

## 2018-01-26 DIAGNOSIS — Z79899 Other long term (current) drug therapy: Secondary | ICD-10-CM | POA: Diagnosis not present

## 2018-01-26 DIAGNOSIS — T82898A Other specified complication of vascular prosthetic devices, implants and grafts, initial encounter: Secondary | ICD-10-CM | POA: Diagnosis not present

## 2018-01-26 DIAGNOSIS — Z87448 Personal history of other diseases of urinary system: Secondary | ICD-10-CM | POA: Diagnosis not present

## 2018-01-26 DIAGNOSIS — Z940 Kidney transplant status: Secondary | ICD-10-CM | POA: Diagnosis not present

## 2018-02-07 DIAGNOSIS — Z792 Long term (current) use of antibiotics: Secondary | ICD-10-CM | POA: Diagnosis not present

## 2018-02-07 DIAGNOSIS — I471 Supraventricular tachycardia: Secondary | ICD-10-CM | POA: Diagnosis not present

## 2018-02-07 DIAGNOSIS — Z87891 Personal history of nicotine dependence: Secondary | ICD-10-CM | POA: Diagnosis not present

## 2018-02-07 DIAGNOSIS — D649 Anemia, unspecified: Secondary | ICD-10-CM | POA: Diagnosis not present

## 2018-02-07 DIAGNOSIS — T827XXA Infection and inflammatory reaction due to other cardiac and vascular devices, implants and grafts, initial encounter: Secondary | ICD-10-CM | POA: Diagnosis not present

## 2018-02-07 DIAGNOSIS — B259 Cytomegaloviral disease, unspecified: Secondary | ICD-10-CM | POA: Diagnosis not present

## 2018-02-07 DIAGNOSIS — Z9889 Other specified postprocedural states: Secondary | ICD-10-CM | POA: Diagnosis not present

## 2018-02-07 DIAGNOSIS — I151 Hypertension secondary to other renal disorders: Secondary | ICD-10-CM | POA: Diagnosis not present

## 2018-02-07 DIAGNOSIS — Z940 Kidney transplant status: Secondary | ICD-10-CM | POA: Diagnosis not present

## 2018-02-07 DIAGNOSIS — K922 Gastrointestinal hemorrhage, unspecified: Secondary | ICD-10-CM | POA: Diagnosis not present

## 2018-02-07 DIAGNOSIS — Z4802 Encounter for removal of sutures: Secondary | ICD-10-CM | POA: Diagnosis not present

## 2018-02-07 DIAGNOSIS — D8989 Other specified disorders involving the immune mechanism, not elsewhere classified: Secondary | ICD-10-CM | POA: Diagnosis not present

## 2018-02-07 DIAGNOSIS — Z720 Tobacco use: Secondary | ICD-10-CM | POA: Diagnosis not present

## 2018-02-07 DIAGNOSIS — Z79899 Other long term (current) drug therapy: Secondary | ICD-10-CM | POA: Diagnosis not present

## 2018-02-07 DIAGNOSIS — E872 Acidosis: Secondary | ICD-10-CM | POA: Diagnosis not present

## 2018-02-07 DIAGNOSIS — Z4822 Encounter for aftercare following kidney transplant: Secondary | ICD-10-CM | POA: Diagnosis not present

## 2018-02-07 DIAGNOSIS — Z7952 Long term (current) use of systemic steroids: Secondary | ICD-10-CM | POA: Diagnosis not present

## 2018-02-07 DIAGNOSIS — J449 Chronic obstructive pulmonary disease, unspecified: Secondary | ICD-10-CM | POA: Diagnosis not present

## 2018-02-07 DIAGNOSIS — E871 Hypo-osmolality and hyponatremia: Secondary | ICD-10-CM | POA: Diagnosis not present

## 2018-02-07 DIAGNOSIS — I10 Essential (primary) hypertension: Secondary | ICD-10-CM | POA: Diagnosis not present

## 2018-02-07 DIAGNOSIS — M542 Cervicalgia: Secondary | ICD-10-CM | POA: Diagnosis not present

## 2018-02-21 DIAGNOSIS — D8989 Other specified disorders involving the immune mechanism, not elsewhere classified: Secondary | ICD-10-CM | POA: Diagnosis not present

## 2018-02-21 DIAGNOSIS — Z4822 Encounter for aftercare following kidney transplant: Secondary | ICD-10-CM | POA: Diagnosis not present

## 2018-02-21 DIAGNOSIS — Z940 Kidney transplant status: Secondary | ICD-10-CM | POA: Diagnosis not present

## 2018-02-21 DIAGNOSIS — Z792 Long term (current) use of antibiotics: Secondary | ICD-10-CM | POA: Diagnosis not present

## 2018-02-21 DIAGNOSIS — I10 Essential (primary) hypertension: Secondary | ICD-10-CM | POA: Diagnosis not present

## 2018-02-21 DIAGNOSIS — Z48812 Encounter for surgical aftercare following surgery on the circulatory system: Secondary | ICD-10-CM | POA: Diagnosis not present

## 2018-02-21 DIAGNOSIS — M542 Cervicalgia: Secondary | ICD-10-CM | POA: Diagnosis not present

## 2018-02-21 DIAGNOSIS — E872 Acidosis: Secondary | ICD-10-CM | POA: Diagnosis not present

## 2018-02-21 DIAGNOSIS — B258 Other cytomegaloviral diseases: Secondary | ICD-10-CM | POA: Diagnosis not present

## 2018-02-21 DIAGNOSIS — Z79899 Other long term (current) drug therapy: Secondary | ICD-10-CM | POA: Diagnosis not present

## 2018-02-21 DIAGNOSIS — J449 Chronic obstructive pulmonary disease, unspecified: Secondary | ICD-10-CM | POA: Diagnosis not present

## 2018-02-21 DIAGNOSIS — Z87891 Personal history of nicotine dependence: Secondary | ICD-10-CM | POA: Diagnosis not present

## 2018-03-08 DIAGNOSIS — N2581 Secondary hyperparathyroidism of renal origin: Secondary | ICD-10-CM | POA: Diagnosis not present

## 2018-03-08 DIAGNOSIS — N189 Chronic kidney disease, unspecified: Secondary | ICD-10-CM | POA: Diagnosis not present

## 2018-03-08 DIAGNOSIS — D631 Anemia in chronic kidney disease: Secondary | ICD-10-CM | POA: Diagnosis not present

## 2018-03-08 DIAGNOSIS — I129 Hypertensive chronic kidney disease with stage 1 through stage 4 chronic kidney disease, or unspecified chronic kidney disease: Secondary | ICD-10-CM | POA: Diagnosis not present

## 2018-03-08 DIAGNOSIS — Z940 Kidney transplant status: Secondary | ICD-10-CM | POA: Diagnosis not present

## 2018-03-08 DIAGNOSIS — Z79899 Other long term (current) drug therapy: Secondary | ICD-10-CM | POA: Diagnosis not present

## 2018-04-12 DIAGNOSIS — M4722 Other spondylosis with radiculopathy, cervical region: Secondary | ICD-10-CM | POA: Diagnosis not present

## 2018-04-24 DIAGNOSIS — I129 Hypertensive chronic kidney disease with stage 1 through stage 4 chronic kidney disease, or unspecified chronic kidney disease: Secondary | ICD-10-CM | POA: Diagnosis not present

## 2018-04-24 DIAGNOSIS — Z940 Kidney transplant status: Secondary | ICD-10-CM | POA: Diagnosis not present

## 2018-05-03 DIAGNOSIS — T829XXA Unspecified complication of cardiac and vascular prosthetic device, implant and graft, initial encounter: Secondary | ICD-10-CM | POA: Diagnosis not present

## 2018-05-03 DIAGNOSIS — I129 Hypertensive chronic kidney disease with stage 1 through stage 4 chronic kidney disease, or unspecified chronic kidney disease: Secondary | ICD-10-CM | POA: Diagnosis not present

## 2018-05-03 DIAGNOSIS — D631 Anemia in chronic kidney disease: Secondary | ICD-10-CM | POA: Diagnosis not present

## 2018-05-03 DIAGNOSIS — Z79899 Other long term (current) drug therapy: Secondary | ICD-10-CM | POA: Diagnosis not present

## 2018-05-03 DIAGNOSIS — N2581 Secondary hyperparathyroidism of renal origin: Secondary | ICD-10-CM | POA: Diagnosis not present

## 2018-05-03 DIAGNOSIS — Z940 Kidney transplant status: Secondary | ICD-10-CM | POA: Diagnosis not present

## 2018-05-03 DIAGNOSIS — N189 Chronic kidney disease, unspecified: Secondary | ICD-10-CM | POA: Diagnosis not present

## 2018-05-04 ENCOUNTER — Ambulatory Visit (INDEPENDENT_AMBULATORY_CARE_PROVIDER_SITE_OTHER): Payer: Medicare Other | Admitting: Internal Medicine

## 2018-05-04 ENCOUNTER — Encounter: Payer: Self-pay | Admitting: Internal Medicine

## 2018-05-04 ENCOUNTER — Other Ambulatory Visit: Payer: Self-pay | Admitting: Internal Medicine

## 2018-05-04 DIAGNOSIS — F419 Anxiety disorder, unspecified: Secondary | ICD-10-CM | POA: Diagnosis not present

## 2018-05-04 MED ORDER — ALPRAZOLAM 0.25 MG PO TABS: 0.2500 mg | ORAL_TABLET | Freq: Every day | ORAL | 1 refills | Status: DC | PRN

## 2018-05-04 NOTE — Assessment & Plan Note (Signed)
Refill xanax which she uses rarely. She is also reminded and counseled about therapists which are available virtual now and coping skills which can help as well. Reminded to decrease news intake and continue to talk and connect with family and friends.

## 2018-05-04 NOTE — Progress Notes (Signed)
Virtual Visit via Video Note  I connected with [name] on 05/04/18 at 10:40 AM EDT by a video enabled telemedicine application and verified that I am speaking with the correct person using two identifiers.   I discussed the limitations of evaluation and management by telemedicine and the availability of in person appointments. The patient expressed understanding and agreed to proceed.  History of Present Illness: The patient is a 58 y.o. female with visit for anxiety. Started with serious health problems around her kidney transplant. Has been doing well and is seeing her nephrologist mainly now. She denies missing doses. She has been more anxious due to coronavirus outbreak. Denies depression or crying. 1 panic attack. Is trying to exercise still but this is more difficult. Overall it is worsening slightly. Has rare xanax in the past and is out of this currently. Has tried nothing currently except xanax and some coping skills at home.  Observations/Objective: Appearance: normal, breathing appears normal, casual grooming, abdomen does not appear distended, throat normal, mental status is A and O times 3  Assessment and Plan: See problem oriented charting  Follow Up Instructions: rx for xanax 0.5 mg daily prn for anxiety and discussion about coping skills  I discussed the assessment and treatment plan with the patient. The patient was provided an opportunity to ask questions and all were answered. The patient agreed with the plan and demonstrated an understanding of the instructions.   The patient was advised to call back or seek an in-person evaluation if the symptoms worsen or if the condition fails to improve as anticipated.  Hoyt Koch, MD

## 2018-05-04 NOTE — Telephone Encounter (Signed)
Control database checked last refill: 07/14/2017 LOV: today virtual, 10/03/2017 NOV: none

## 2018-05-04 NOTE — Telephone Encounter (Signed)
Requested medication (s) are due for refill today: yes  Requested medication (s) are on the active medication list: yes  Last refill:  07/14/17  Future visit scheduled: yes today  Notes to clinic:  Refilling this medication not delegated to NT   Requested Prescriptions  Pending Prescriptions Disp Refills   ALPRAZolam (XANAX) 0.25 MG tablet 20 tablet 0    Sig: Take 1 tablet (0.25 mg total) by mouth daily as needed for anxiety.     Not Delegated - Psychiatry:  Anxiolytics/Hypnotics Failed - 05/04/2018  9:32 AM      Failed - This refill cannot be delegated      Failed - Urine Drug Screen completed in last 360 days.      Failed - Valid encounter within last 6 months    Recent Outpatient Visits          7 months ago Acute blood loss anemia   [place], MD   8 months ago Acute blood loss anemia   [facility] -Chuck Hint, MD   9 months ago [facility] -Chuck Hint, MD   1 year ago [facility] -Chuck Hint, MD   1 year ago HTN (hypertension), benign   [place], Charlene Brooke, NP      Future Appointments            Today Hoyt Koch, MD [place], [place]

## 2018-06-01 DIAGNOSIS — Z940 Kidney transplant status: Secondary | ICD-10-CM | POA: Diagnosis not present

## 2018-06-01 DIAGNOSIS — I129 Hypertensive chronic kidney disease with stage 1 through stage 4 chronic kidney disease, or unspecified chronic kidney disease: Secondary | ICD-10-CM | POA: Diagnosis not present

## 2018-07-05 DIAGNOSIS — T829XXA Unspecified complication of cardiac and vascular prosthetic device, implant and graft, initial encounter: Secondary | ICD-10-CM | POA: Diagnosis not present

## 2018-07-05 DIAGNOSIS — I129 Hypertensive chronic kidney disease with stage 1 through stage 4 chronic kidney disease, or unspecified chronic kidney disease: Secondary | ICD-10-CM | POA: Diagnosis not present

## 2018-07-05 DIAGNOSIS — Z940 Kidney transplant status: Secondary | ICD-10-CM | POA: Diagnosis not present

## 2018-07-05 DIAGNOSIS — N2581 Secondary hyperparathyroidism of renal origin: Secondary | ICD-10-CM | POA: Diagnosis not present

## 2018-07-05 DIAGNOSIS — D631 Anemia in chronic kidney disease: Secondary | ICD-10-CM | POA: Diagnosis not present

## 2018-07-05 DIAGNOSIS — N189 Chronic kidney disease, unspecified: Secondary | ICD-10-CM | POA: Diagnosis not present

## 2018-07-05 DIAGNOSIS — Z79899 Other long term (current) drug therapy: Secondary | ICD-10-CM | POA: Diagnosis not present

## 2018-07-29 IMAGING — MG 2D DIGITAL SCREENING BILATERAL MAMMOGRAM WITH CAD AND ADJUNCT TO
9 of 12 series · 9 of 28 positions shown · non-contrast
Comparison: Previous exam(s).

CLINICAL DATA: Screening.

EXAM:
2D DIGITAL SCREENING BILATERAL MAMMOGRAM WITH CAD AND ADJUNCT TOMO

[L MLO synth-2D]
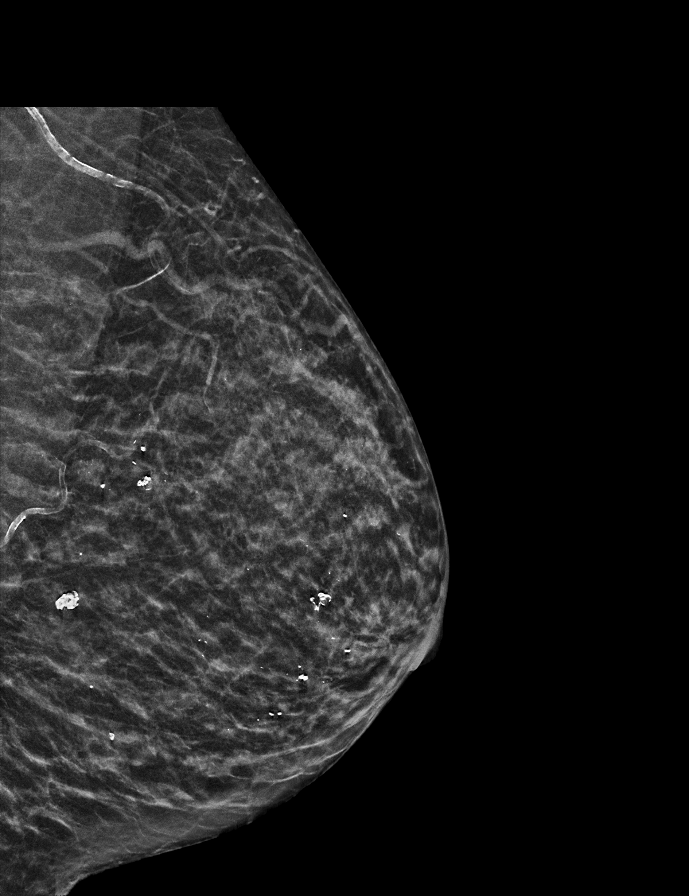

[R MLO]
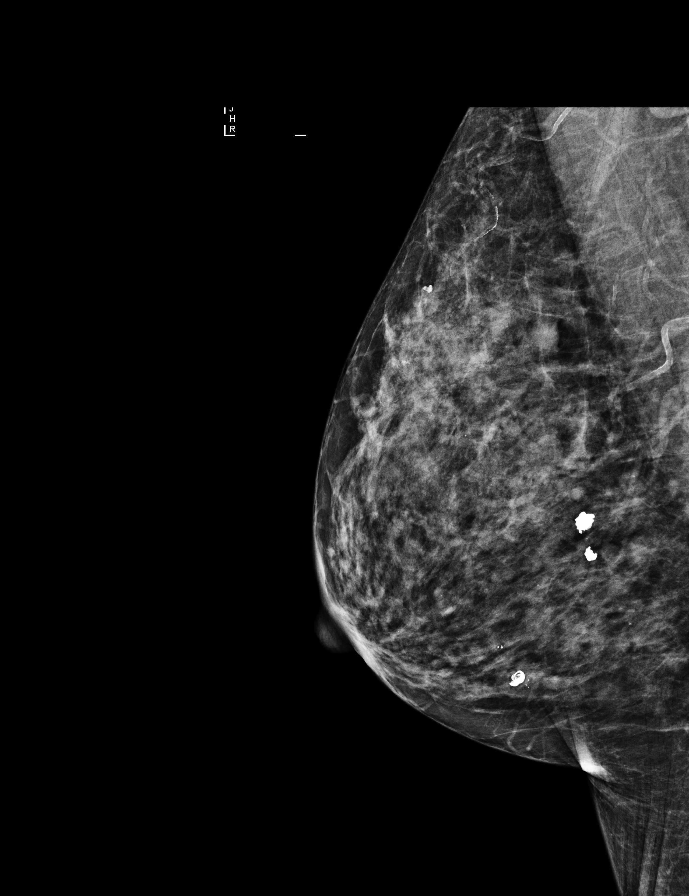

[L CC]
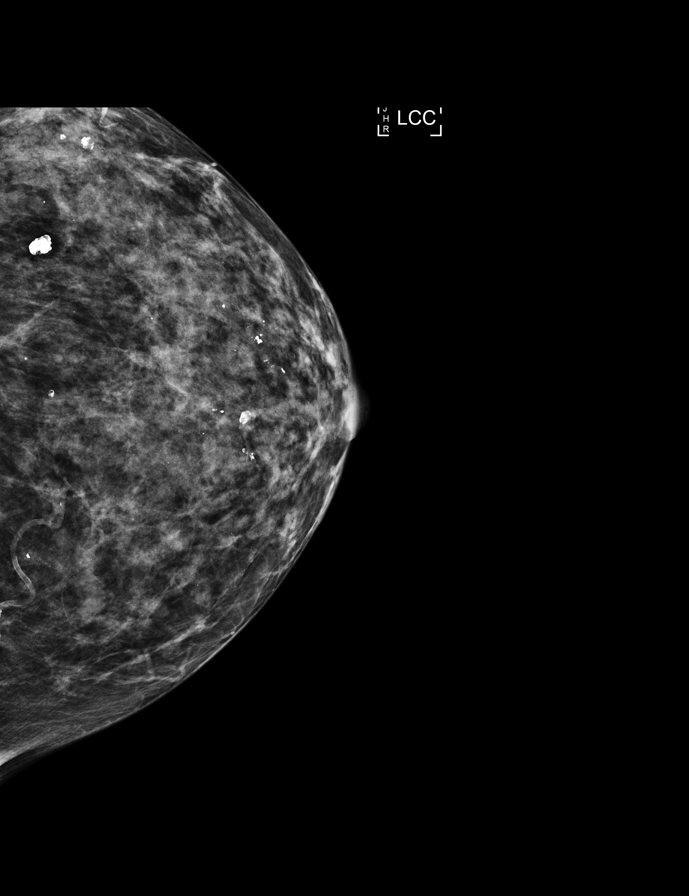

[R CC]
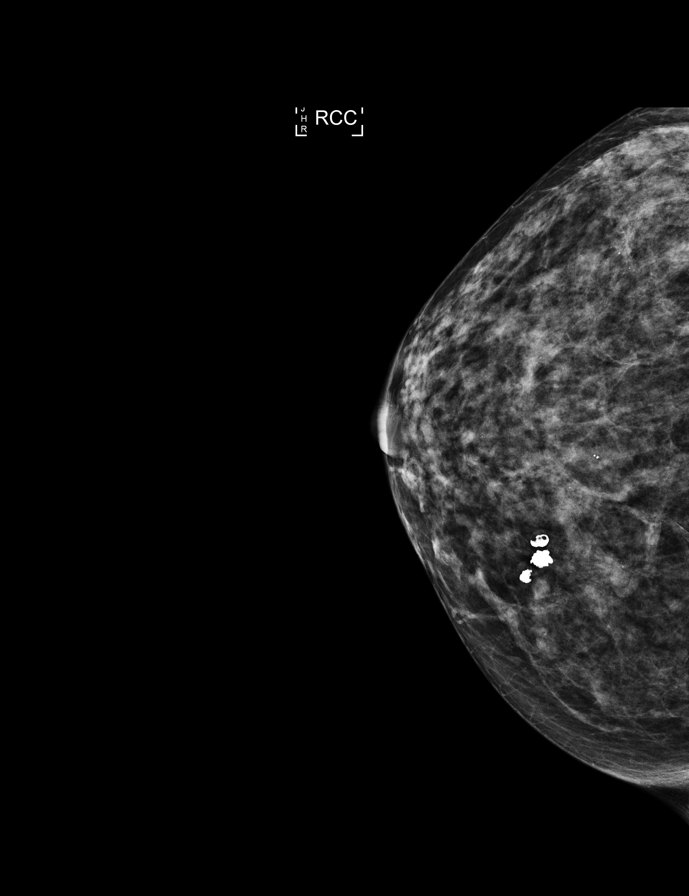

[R MLO synth-2D]
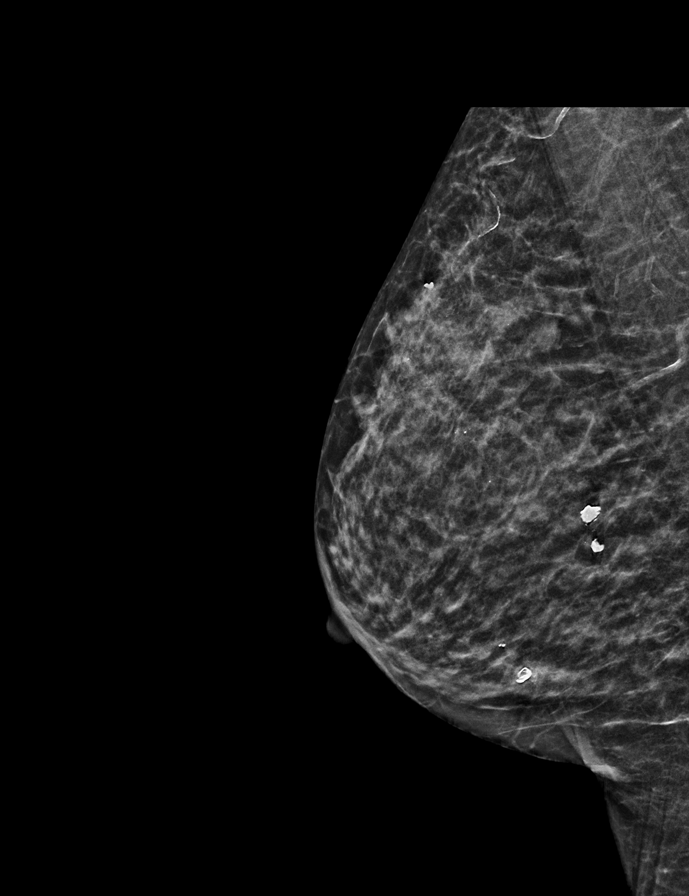

[L CC synth-2D]
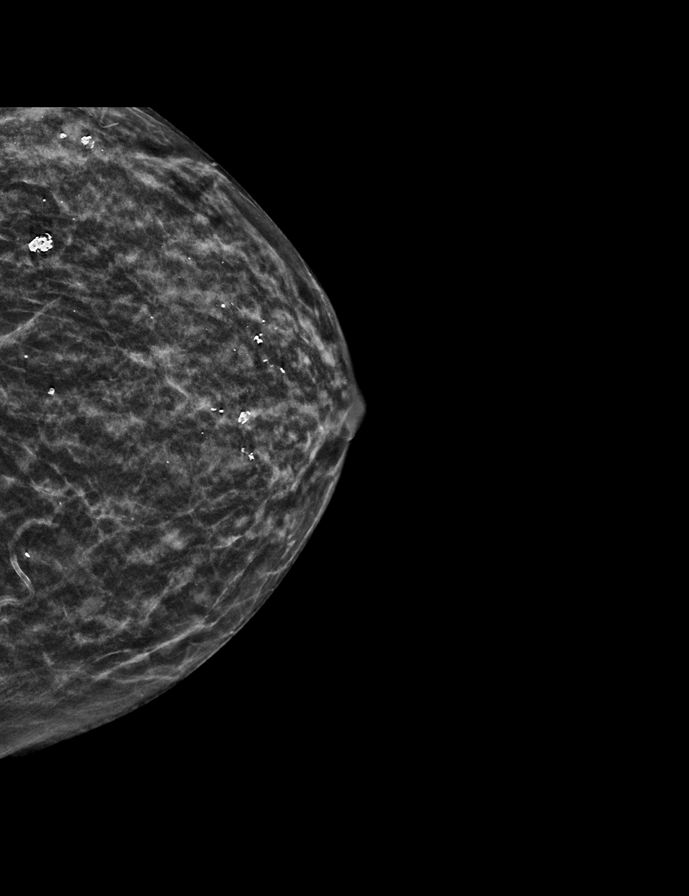

[R CC synth-2D]
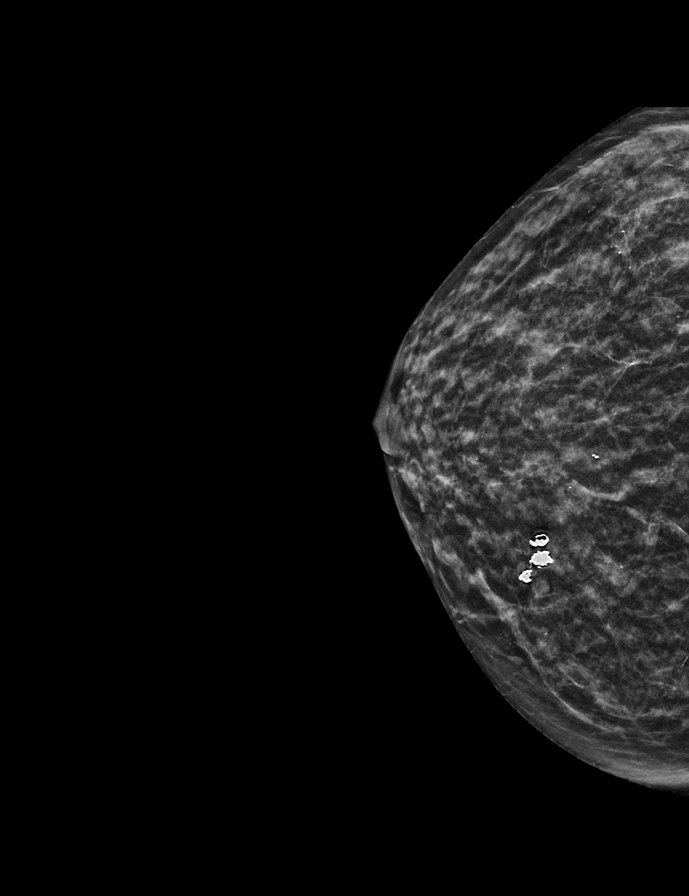

[L MLO]
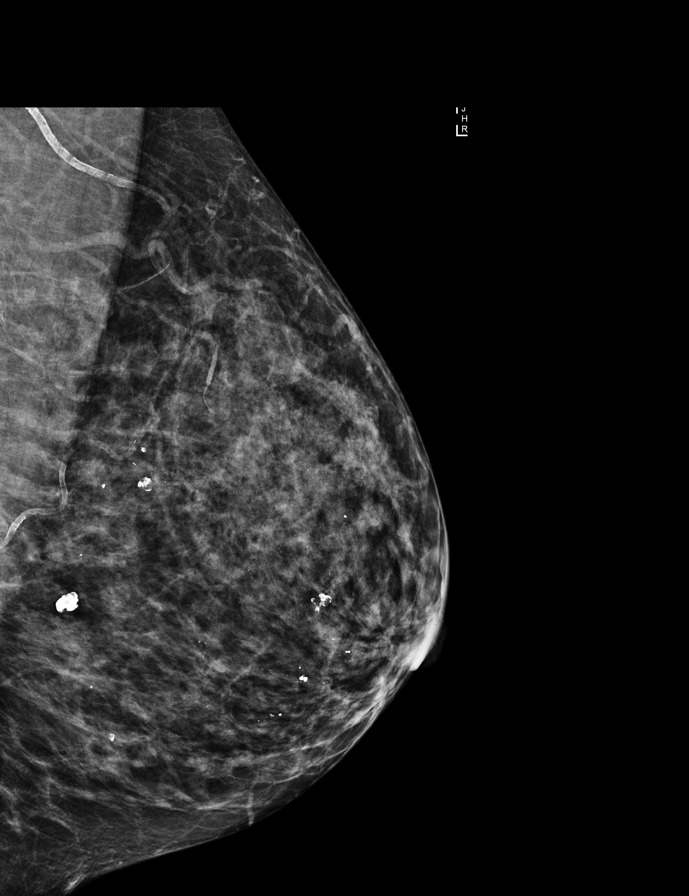

[R MLO tomo · tomo slice 19/37.0]
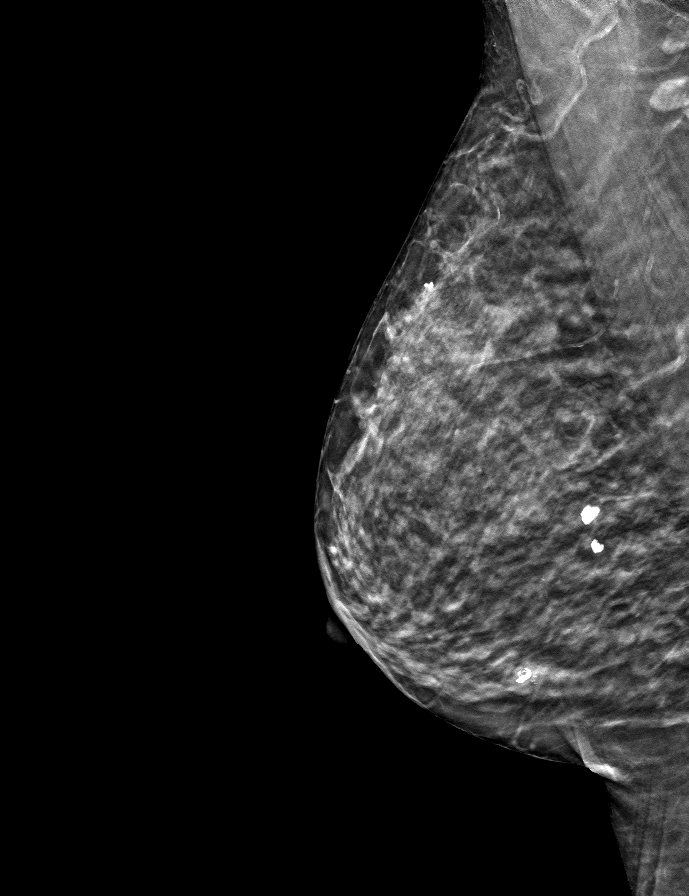

[9 of 28 positions shown; findings below may reference images not displayed]

ACR Breast Density Category c: The breast tissue is heterogeneously
dense, which may obscure small masses.
FINDINGS: There are no findings suspicious for malignancy. Images were
processed with CAD.
IMPRESSION: No mammographic evidence of malignancy. A result letter of this
screening mammogram will be mailed directly to the patient.

RECOMMENDATION:
Screening mammogram in one year. (Code:TN-[id number])

BI-RADS CATEGORY  1: Negative.

## 2018-07-31 IMAGING — RF DG C-ARM 61-120 MIN
1 series · 1 of 1 positions shown · non-contrast
Comparison: MR 07/25/2016

CLINICAL DATA: 55-year-old female with a history of cervical
stenosis

EXAM:
CERVICAL SPINE 1 VIEW

[Series 1: run · 1 of 1 slices shown]
[im 1/1]
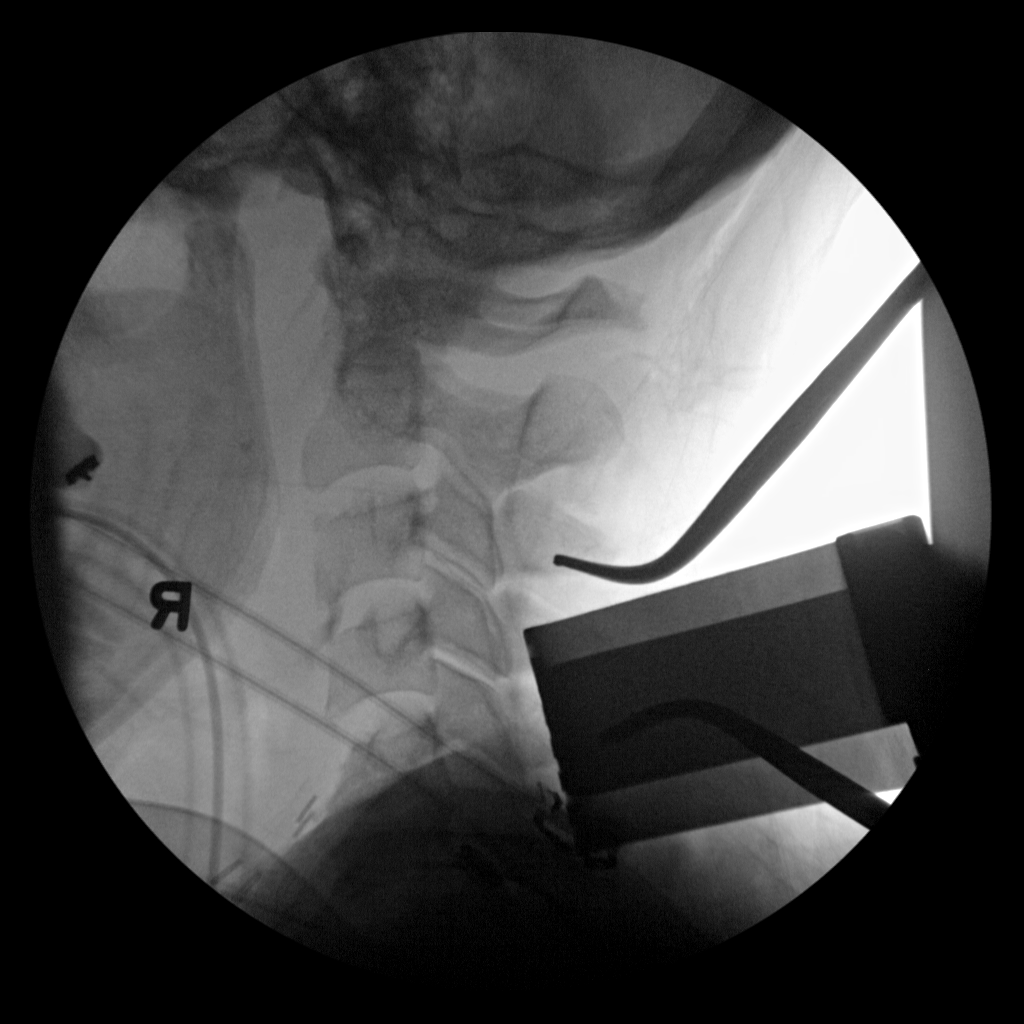

[1 of 1 positions shown; findings below may reference images not displayed]

FINDINGS: Single intraoperative fluoroscopic cross-table lateral the cervical
spine.

Images demonstrate tissue re- tractor posterior to the C4 and C5
vertebral bodies.

Superior surgical probe identifies the posterior elements of C3 and
the inferior probe identifies the posterior elements of C5.
IMPRESSION: Single intraoperative cross-table lateral of the cervical spine, as
above. Please refer to the dictated operative report for full
details of intraoperative findings and procedure.

## 2018-08-02 NOTE — Progress Notes (Addendum)
Subjective:   [name] is a 58 y.o. female who presents for Medicare Annual (Subsequent) preventive examination. I connected with patient by a telephone and verified that I am speaking with the correct person using two identifiers. Patient stated full name and DOB. Patient gave permission to continue with telephonic visit. Patient's location was at home and Nurse's location was at [place] office.  Review of Systems:   Cardiac Risk Factors include: hypertension Sleep patterns: feels rested on waking and sleeps 6 hours nightly.    Home Safety/Smoke Alarms: Feels safe in home. Smoke alarms in place.  Living environment; residence and Firearm Safety: 1-story house/ trailer. Lives alone, no needs for DME, good support system Seat Belt Safety/Bike Helmet: Wears seat belt.     Objective:     Vitals: There were no vitals taken for this visit.  There is no height or weight on file to calculate BMI.  Advanced Directives 08/03/2018 09/30/2017 08/21/2017 08/11/2017 07/24/2017 06/22/2017 06/21/2017  Does Patient Have a Medical Advance Directive? No Yes Yes Yes Yes Yes Yes  Type of Advance Directive - Healthcare Power of [place];Living will [place];Living will [place];Living will [place];Living will [place];Living will  Does patient want to make changes to medical advance directive? Yes (MAU/Ambulatory/Procedural Areas - Information given) No - Patient declined No - Patient declined - - - -  Copy of Healthcare Power of Attorney in Chart? - No - copy requested No - copy requested No - copy requested No - copy requested No - copy requested No - copy requested  Would patient like information on creating a medical advance directive? - - - - - - -  Pre-existing out of facility DNR order (yellow form or pink MOST form) - - - - - - -    Tobacco Social History   Tobacco Use  Smoking Status Former  Smoker  . Packs/day: 0.12  . Years: 30.00  . Pack years: 3.60  . Types: Cigarettes  . Quit date: 01/21/2017  . Years since quitting: 1.5  Smokeless Tobacco Never Used     Counseling given: Not Answered  Past Medical History:  Diagnosis Date  . Acute blood loss anemia   . Anemia   . Anxiety   . Arthritis   . Claustrophobia   . Diverticulosis   . ESRD (end stage renal disease) on dialysis ([place])    "TTS; [facility]" (03/24/2015)  . GI bleed   . Glomerulonephritis   . Heart murmur   . HTN (hypertension)   . Hypothyroidism   . Kidney transplant recipient 01/21/2017  . Pancreatitis   . Renal insufficiency   . Secondary hyperparathyroidism ([place])    [name] 05/11/2014   Past Surgical History:  Procedure Laterality Date  . ANTERIOR CERVICAL CORPECTOMY N/A 05/24/2017   Procedure: CORPECTOMY CERVICAL FIVE- CERVICAL SIX;  Surgeon: Ashok Pall, MD;  Location: [facility];  Service: Neurosurgery;  Laterality: N/A;  . ARTERIOVENOUS GRAFT PLACEMENT Right 1990's?  . ARTERIOVENOUS GRAFT PLACEMENT Left 07/2002   upper arm/notes 06/02/2010  . ARTERIOVENOUS GRAFT PLACEMENT Right 07/2003   upper arm/notes 06/02/2010  . AV FISTULA PLACEMENT Left 09/2000   upper arm/notes 06/02/2010  . BREAST BIOPSY Left unsure   benign  . COLONOSCOPY    . DILATATION & CURRETTAGE/HYSTEROSCOPY WITH RESECTOCOPE N/A 04/17/2013   Procedure: DILATATION & CURETTAGE/HYSTEROSCOPY WITH RESECTOCOPE;  Surgeon: Marvene Staff, MD;  Location: [facility];  Service: Gynecology;  Laterality: N/A;  . DILATION AND CURETTAGE OF UTERUS    . EYE SURGERY    . KIDNEY TRANSPLANT  1997  . KIDNEY TRANSPLANT  01/21/2017   Dr. Harrington Challenger  . PARATHYROIDECTOMY  03/2000 05/12/2014   w/neck exploration & autotransplantation/notes 06/02/2010; w/neck exploration  . PARATHYROIDECTOMY N/A 05/12/2014   Procedure: PARATHYROIDECTOMY AND NECK EXPLORATION;  Surgeon: Armandina Gemma, MD;  Location: [facility];  Service: General;  Laterality: N/A;  . PERITONEAL  CATHETER INSERTION    . PERITONEAL CATHETER REMOVAL  08/1999   [name] 06/02/2010  . POSTERIOR CERVICAL FUSION/FORAMINOTOMY N/A 05/30/2017   Procedure: POSTERIOR CERVICAL Arthrodesis Cervical three - four, cervical four - five, cervical five - six, cervical six - seven;  Surgeon: Ashok Pall, MD;  Location: [facility];  Service: Neurosurgery;  Laterality: N/A;  . POSTERIOR CERVICAL LAMINECTOMY N/A 08/12/2016   Procedure: POSTERIOR CERVICAL LAMINECTOMY CERVICAL THREE CERVICAL FOUR, CERVICAL FOUR CERVICAL FIVE, CERVICAL FIVE- CERVICAL SIX, CERVICAL SIX- CERVICAL SEVEN;  Surgeon: Ashok Pall, MD;  Location: [facility];  Service: Neurosurgery;  Laterality: N/A;  POSTERIOR  . RETINAL DETACHMENT SURGERY Left   . THROMBECTOMY Left 02/2002   fistula/notes 06/02/2010  . THROMBECTOMY / ARTERIOVENOUS GRAFT REVISION  11/2003; 01/2004; 08/07/2005; 08/10/2005; 10/2005   [name] 06/02/2010  . THROMBECTOMY AND REVISION OF ARTERIOVENTOUS (AV) GORETEX  GRAFT Right 01/2002; 11/2003; 01/2004; 08/07/2004; 08/10/2004; 11/13/2005   [name] 5/1/2012Marland Kitchen [name] 5/16/2012Marland Kitchen [name] 5/16/2012Marland Kitchen [name] 5/16/2012Marland Kitchen [name] 06/02/2010; [name] 06/02/2010  . THROMBECTOMY AND REVISION OF ARTERIOVENTOUS (AV) GORETEX  GRAFT Left 08/23/2002; 09/13/2002; 07/08/2003   [name] 06/02/2010; [name] 06/02/2010; [name] 06/02/2010   Family History  Adopted: Yes  Problem Relation Age of Onset  . Diabetes Maternal Aunt        x2  . Diabetes Maternal Uncle    Social History   Socioeconomic History  . Marital status: Divorced    Spouse name: Not on file  . Number of children: 1  . Years of education: Not on file  . Highest education level: Not on file  Occupational History  . Occupation: disability  Social Needs  . Financial resource strain: Not hard at all  . Food insecurity    Worry: Never true    Inability: Never true  . Transportation needs    Medical: No    Non-medical: No  Tobacco Use  . Smoking status: Former Smoker    Packs/day: 0.12    Years: 30.00     Pack years: 3.60    Types: Cigarettes    Quit date: 01/21/2017    Years since quitting: 1.5  . Smokeless tobacco: Never Used  Substance and Sexual Activity  . Alcohol use: No  . Drug use: No  . Sexual activity: Not Currently  Lifestyle  . Physical activity    Days per week: 5 days    Minutes per session: 40 min  . Stress: Only a little  Relationships  . Social connections    Talks on phone: More than three times a week    Gets together: More than three times a week    Attends religious service: More than 4 times per year    Active member of club or organization: Yes    Attends meetings of clubs or organizations: More than 4 times per year    Relationship status: Divorced  Other Topics Concern  . Not on file  Social History Narrative  . Not on file    Outpatient Encounter Medications as of 08/03/2018  Medication Sig  .  acetaminophen (TYLENOL) 500 MG tablet Take 1,000 mg by mouth every 6 (six) hours as needed for moderate pain or headache.   . ALPRAZolam (XANAX) 0.25 MG tablet Take 1 tablet (0.25 mg total) by mouth daily as needed for anxiety.  . gabapentin (NEURONTIN) 300 MG capsule Take 600 mg by mouth 2 (two) times daily as needed (nerve pain).   . mycophenolate (MYFORTIC) 180 MG EC tablet Take 720 mg by mouth 2 (two) times daily.   . polyethylene glycol (MIRALAX / GLYCOLAX) packet Take 17 g by mouth daily.  . predniSONE (DELTASONE) 2.5 MG tablet Take 7.5 mg by mouth every morning.   . sodium bicarbonate 650 MG tablet Take 650 mg by mouth daily.   Marland Kitchen sulfamethoxazole-trimethoprim (BACTRIM,SEPTRA) 400-80 MG tablet Take 1 tablet by mouth every Monday, Wednesday, and Friday.  . tacrolimus (PROGRAF) 1 MG capsule Take 3-4 mg by mouth See admin instructions. 2.5 mg in morning and in the evening  . [DISCONTINUED] oxyCODONE (OXY IR/ROXICODONE) 5 MG immediate release tablet Take 1-2 tablets (5-10 mg total) by mouth every 4 (four) hours as needed for moderate pain. (Patient not taking:  Reported on 08/03/2018)  . [DISCONTINUED] pantoprazole (PROTONIX) 40 MG tablet Take 1 tablet (40 mg total) by mouth daily. (Patient not taking: Reported on 09/11/2017)  . [DISCONTINUED] ranitidine (ZANTAC) 150 MG tablet Take 150 mg by mouth at bedtime.   No facility-administered encounter medications on file as of 08/03/2018.     Activities of Daily Living In your present state of health, do you have any difficulty performing the following activities: 08/03/2018 09/30/2017  Hearing? N N  Vision? N N  Difficulty concentrating or making decisions? N N  Walking or climbing stairs? N N  Dressing or bathing? N N  Doing errands, shopping? N N  Preparing Food and eating ? N -  Using the Toilet? N -  In the past six months, have you accidently leaked urine? N -  Do you have problems with loss of bowel control? N -  Managing your Medications? N -  Managing your Finances? N -  Housekeeping or managing your Housekeeping? N -  Some recent data might be hidden    Patient Care Team: Hoyt Koch, MD as PCP - General (Internal Medicine)    Assessment:   This is a routine wellness examination for Stephanne. Physical assessment deferred to PCP.   Exercise Activities and Dietary recommendations Current Exercise Habits: Home exercise routine, Type of exercise: walking, Time (Minutes): 40, Frequency (Times/Week): 5, Weekly Exercise (Minutes/Week): 200, Intensity: Mild, Exercise limited by: orthopedic condition(s)  Diet (meal preparation, eat out, water intake, caffeinated beverages, dairy products, fruits and vegetables): in general, a "healthy" diet  , well balanced   Encouraged patient to increase daily water and healthy fluid intake.  Goals    . Patient Stated     I want to continue to walk routinely and increase my distance gradually to up to 5 miles.       Fall Risk Fall Risk  08/03/2018 06/21/2017  Falls in the past year? 0 No  Number falls in past yr: 0 -    Depression Screen PHQ  2/9 Scores 08/03/2018 06/21/2017 06/01/2016  PHQ - 2 Score 0 1 0  PHQ- 9 Score - 3 1     Cognitive Function       Ad8 score reviewed for issues:  Issues making decisions: no  Less interest in hobbies / activities: no  Repeats questions, stories (family complaining):  no  Trouble using ordinary gadgets (microwave, computer, phone):no  Forgets the month or year: no  Mismanaging finances: no  Remembering appts: no  Daily problems with thinking and/or memory: no Ad8 score is= 0  Immunization History  Administered Date(s) Administered  . Influenza,inj,Quad PF,6+ Mos 10/03/2017  . Influenza-Unspecified 10/15/2016  . Tdap 11/21/2014   Screening Tests Health Maintenance  Topic Date Due  . PAP SMEAR-Modifier  01/18/2016  . MAMMOGRAM  08/11/2018  . INFLUENZA VACCINE  08/18/2018  . COLONOSCOPY  04/23/2023  . TETANUS/TDAP  11/20/2024  . Hepatitis C Screening  Completed  . HIV Screening  Completed      Plan:    Reviewed health maintenance screenings with patient today and relevant education, vaccines, and/or referrals were provided.   Continue to eat heart healthy diet (full of fruits, vegetables, whole grains, lean protein, water--limit salt, fat, and sugar intake) and increase physical activity as tolerated.  Continue doing brain stimulating activities (puzzles, reading, adult coloring books, staying active) to keep memory sharp.   I have personally reviewed and noted the following in the patient's chart:   . Medical and social history . Use of alcohol, tobacco or illicit drugs  . Current medications and supplements . Functional ability and status . Nutritional status . Physical activity . Advanced directives . List of other physicians . Screenings to include cognitive, depression, and falls . Referrals and appointments  In addition, I have reviewed and discussed with patient certain preventive protocols, quality metrics, and best practice recommendations. A written  personalized care plan for preventive services as well as general preventive health recommendations were provided to patient.     Michiel Cowboy, RN  08/03/2018    Medical screening examination/treatment/procedure(s) were performed by non-physician practitioner and as supervising physician I was immediately available for consultation/collaboration. I agree with above. Cathlean Cower, MD

## 2018-08-03 ENCOUNTER — Ambulatory Visit (INDEPENDENT_AMBULATORY_CARE_PROVIDER_SITE_OTHER): Payer: Medicare Other | Admitting: *Deleted

## 2018-08-03 DIAGNOSIS — Z0000 Encounter for general adult medical examination without abnormal findings: Secondary | ICD-10-CM | POA: Diagnosis not present

## 2018-08-03 NOTE — Patient Instructions (Signed)
Continue to eat heart healthy diet (full of fruits, vegetables, whole grains, lean protein, water--limit salt, fat, and sugar intake) and increase physical activity as tolerated.  Continue doing brain stimulating activities (puzzles, reading, adult coloring books, staying active) to keep memory sharp.   [name] , Thank you for taking time to come for your Medicare Wellness Visit. I appreciate your ongoing commitment to your health goals. Please review the following plan we discussed and let me know if I can assist you in the future.   These are the goals we discussed: Goals    . Patient Stated     I want to continue to walk routinely and increase my distance gradually to up to 5 miles.       This is a list of the screening recommended for you and due dates:  Health Maintenance  Topic Date Due  . Pap Smear  01/18/2016  . Mammogram  08/11/2018  . Flu Shot  08/18/2018  . Colon Cancer Screening  04/23/2023  . Tetanus Vaccine  11/20/2024  .  Hepatitis C: One time screening is recommended by Center for Disease Control  (CDC) for  adults born from [birth year] through 1965.   Completed  . HIV Screening  Completed    Health Maintenance, Female Adopting a healthy lifestyle and getting preventive care are important in promoting health and wellness. Ask your health care provider about:  The right schedule for you to have regular tests and exams.  Things you can do on your own to prevent diseases and keep yourself healthy. What should I know about diet, weight, and exercise? Eat a healthy diet   Eat a diet that includes plenty of vegetables, fruits, low-fat dairy products, and lean protein.  Do not eat a lot of foods that are high in solid fats, added sugars, or sodium. Maintain a healthy weight Body mass index (BMI) is used to identify weight problems. It estimates body fat based on height and weight. Your health care provider can help determine your BMI and help you achieve or maintain a  healthy weight. Get regular exercise Get regular exercise. This is one of the most important things you can do for your health. Most adults should:  Exercise for at least 150 minutes each week. The exercise should increase your heart rate and make you sweat (moderate-intensity exercise).  Do strengthening exercises at least twice a week. This is in addition to the moderate-intensity exercise.  Spend less time sitting. Even light physical activity can be beneficial. Watch cholesterol and blood lipids Have your blood tested for lipids and cholesterol at 58 years of age, then have this test every 5 years. Have your cholesterol levels checked more often if:  Your lipid or cholesterol levels are high.  You are older than 58 years of age.  You are at high risk for heart disease. What should I know about cancer screening? Depending on your health history and family history, you may need to have cancer screening at various ages. This may include screening for:  Breast cancer.  Cervical cancer.  Colorectal cancer.  Skin cancer.  Lung cancer. What should I know about heart disease, diabetes, and high blood pressure? Blood pressure and heart disease  High blood pressure causes heart disease and increases the risk of stroke. This is more likely to develop in people who have high blood pressure readings, are of African descent, or are overweight.  Have your blood pressure checked: ? Every 3-5 years if you are  19-87 years of age. ? Every year if you are 29 years old or older. Diabetes Have regular diabetes screenings. This checks your fasting blood sugar level. Have the screening done:  Once every three years after age 10 if you are at a normal weight and have a low risk for diabetes.  More often and at a younger age if you are overweight or have a high risk for diabetes. What should I know about preventing infection? Hepatitis B If you have a higher risk for hepatitis B, you should  be screened for this virus. Talk with your health care provider to find out if you are at risk for hepatitis B infection. Hepatitis C Testing is recommended for:  Everyone born from [birth year] through 1965.  Anyone with known risk factors for hepatitis C. Sexually transmitted infections (STIs)  Get screened for STIs, including gonorrhea and chlamydia, if: ? You are sexually active and are younger than 58 years of age. ? You are older than 58 years of age and your health care provider tells you that you are at risk for this type of infection. ? Your sexual activity has changed since you were last screened, and you are at increased risk for chlamydia or gonorrhea. Ask your health care provider if you are at risk.  Ask your health care provider about whether you are at high risk for HIV. Your health care provider may recommend a prescription medicine to help prevent HIV infection. If you choose to take medicine to prevent HIV, you should first get tested for HIV. You should then be tested every 3 months for as long as you are taking the medicine. Pregnancy  If you are about to stop having your period (premenopausal) and you may become pregnant, seek counseling before you get pregnant.  Take 400 to 800 micrograms (mcg) of folic acid every day if you become pregnant.  Ask for birth control (contraception) if you want to prevent pregnancy. Osteoporosis and menopause Osteoporosis is a disease in which the bones lose minerals and strength with aging. This can result in bone fractures. If you are 58 years old or older, or if you are at risk for osteoporosis and fractures, ask your health care provider if you should:  Be screened for bone loss.  Take a calcium or vitamin D supplement to lower your risk of fractures.  Be given hormone replacement therapy (HRT) to treat symptoms of menopause. Follow these instructions at home: Lifestyle  Do not use any products that contain nicotine or tobacco, such  as cigarettes, e-cigarettes, and chewing tobacco. If you need help quitting, ask your health care provider.  Do not use street drugs.  Do not share needles.  Ask your health care provider for help if you need support or information about quitting drugs. Alcohol use  Do not drink alcohol if: ? Your health care provider tells you not to drink. ? You are pregnant, may be pregnant, or are planning to become pregnant.  If you drink alcohol: ? Limit how much you use to 0-1 drink a day. ? Limit intake if you are breastfeeding.  Be aware of how much alcohol is in your drink. In the [place] one drink equals one 12 oz bottle of beer (355 mL), one 5 oz glass of wine (148 mL), or one 1 oz glass of hard liquor (44 mL). General instructions  Schedule regular health, dental, and eye exams.  Stay current with your vaccines.  Tell your health care provider if: ?  You often feel depressed. ? You have ever been abused or do not feel safe at home. Summary  Adopting a healthy lifestyle and getting preventive care are important in promoting health and wellness.  Follow your health care provider's instructions about healthy diet, exercising, and getting tested or screened for diseases.  Follow your health care provider's instructions on monitoring your cholesterol and blood pressure. This information is not intended to replace advice given to you by your health care provider. Make sure you discuss any questions you have with your health care provider. Document Released: 07/19/2010 Document Revised: 12/27/2017 Document Reviewed: 12/27/2017 Elsevier Patient Education  2020 [employer].

## 2018-08-10 DIAGNOSIS — Z940 Kidney transplant status: Secondary | ICD-10-CM | POA: Diagnosis not present

## 2018-08-10 DIAGNOSIS — I129 Hypertensive chronic kidney disease with stage 1 through stage 4 chronic kidney disease, or unspecified chronic kidney disease: Secondary | ICD-10-CM | POA: Diagnosis not present

## 2018-09-25 DIAGNOSIS — Z940 Kidney transplant status: Secondary | ICD-10-CM | POA: Diagnosis not present

## 2018-09-25 DIAGNOSIS — I129 Hypertensive chronic kidney disease with stage 1 through stage 4 chronic kidney disease, or unspecified chronic kidney disease: Secondary | ICD-10-CM | POA: Diagnosis not present

## 2018-10-01 DIAGNOSIS — N2581 Secondary hyperparathyroidism of renal origin: Secondary | ICD-10-CM | POA: Diagnosis not present

## 2018-10-01 DIAGNOSIS — E785 Hyperlipidemia, unspecified: Secondary | ICD-10-CM | POA: Diagnosis not present

## 2018-10-01 DIAGNOSIS — D631 Anemia in chronic kidney disease: Secondary | ICD-10-CM | POA: Diagnosis not present

## 2018-10-01 DIAGNOSIS — N189 Chronic kidney disease, unspecified: Secondary | ICD-10-CM | POA: Diagnosis not present

## 2018-10-01 DIAGNOSIS — Z79899 Other long term (current) drug therapy: Secondary | ICD-10-CM | POA: Diagnosis not present

## 2018-10-01 DIAGNOSIS — T829XXA Unspecified complication of cardiac and vascular prosthetic device, implant and graft, initial encounter: Secondary | ICD-10-CM | POA: Diagnosis not present

## 2018-10-01 DIAGNOSIS — Z940 Kidney transplant status: Secondary | ICD-10-CM | POA: Diagnosis not present

## 2018-10-01 DIAGNOSIS — I129 Hypertensive chronic kidney disease with stage 1 through stage 4 chronic kidney disease, or unspecified chronic kidney disease: Secondary | ICD-10-CM | POA: Diagnosis not present

## 2018-11-26 DIAGNOSIS — Z940 Kidney transplant status: Secondary | ICD-10-CM | POA: Diagnosis not present

## 2018-11-26 DIAGNOSIS — I129 Hypertensive chronic kidney disease with stage 1 through stage 4 chronic kidney disease, or unspecified chronic kidney disease: Secondary | ICD-10-CM | POA: Diagnosis not present

## 2018-12-03 ENCOUNTER — Other Ambulatory Visit: Payer: Self-pay | Admitting: Internal Medicine

## 2018-12-03 NOTE — Telephone Encounter (Signed)
Medication Refill - Medication: gabapentin (NEURONTIN) 300 MG capsule   Preferred Pharmacy (with phone number or street name): [id number] [address], [place] [phone] (Phone) [phone] (Fax)     Agent: Please be advised that RX refills may take up to 3 business days. We ask that you follow-up with your pharmacy.

## 2018-12-04 NOTE — Telephone Encounter (Signed)
This patient has not seen Dr. Sharlet Salina in over a year; Dr. Sharlet Salina has not been writing the Gabapentin for her. Patient will need to schedule visit with Dr. Sharlet Salina; she should contact whoever has been managing prescriptions for her.

## 2018-12-05 NOTE — Telephone Encounter (Signed)
Message left for patient

## 2018-12-10 NOTE — Telephone Encounter (Signed)
Pt calling to check on this.  States that she has had virtual visits with Dr. Sharlet Salina and thought that all of her medications would transfer over from the transplant clinic for Dr. Sharlet Salina to manage.  Pt states she is completely out of medication and needs to know what to do.

## 2018-12-10 NOTE — Telephone Encounter (Signed)
Patient has set up an appointment for tomorrow 11/23.

## 2018-12-10 NOTE — Telephone Encounter (Signed)
Schedule patient a visit and ill send in medication

## 2018-12-10 NOTE — Addendum Note (Signed)
Addended by: Pollyann Glen on: 12/10/2018 03:35 PM   Modules accepted: Orders

## 2018-12-11 ENCOUNTER — Encounter: Payer: Self-pay | Admitting: Internal Medicine

## 2018-12-11 ENCOUNTER — Ambulatory Visit (INDEPENDENT_AMBULATORY_CARE_PROVIDER_SITE_OTHER): Payer: Medicare Other | Admitting: Internal Medicine

## 2018-12-11 ENCOUNTER — Other Ambulatory Visit: Payer: Self-pay

## 2018-12-11 VITALS — BP 138/90 | HR 79 | Temp 98.9°F | Ht 67.0 in | Wt 201.0 lb

## 2018-12-11 DIAGNOSIS — G992 Myelopathy in diseases classified elsewhere: Secondary | ICD-10-CM | POA: Diagnosis not present

## 2018-12-11 DIAGNOSIS — M4802 Spinal stenosis, cervical region: Secondary | ICD-10-CM

## 2018-12-11 MED ORDER — ALPRAZOLAM 0.25 MG PO TABS: 0.2500 mg | ORAL_TABLET | Freq: Every day | ORAL | 5 refills | Status: DC | PRN

## 2018-12-11 MED ORDER — GABAPENTIN 300 MG PO CAPS: 600.0000 mg | ORAL_CAPSULE | Freq: Two times a day (BID) | ORAL | 3 refills | Status: DC | PRN

## 2018-12-11 NOTE — Assessment & Plan Note (Signed)
Refill gabapentin 600 mg BID and continue.

## 2018-12-11 NOTE — Patient Instructions (Signed)
We have sent in the refills for the gabapentin and the alprazolam.

## 2018-12-11 NOTE — Progress Notes (Signed)
   Subjective:   Patient ID: [name], female    DOB: 10/21/1960, 58 y.o.   MRN: [id number]  HPI The patient is a 58 YO female coming in for follow up of her neuropathy pain. She has been stable on gabapentin 600 mg BID for some time. She has been seeing kidney transplant doctor who prescribed it. She still sees them and they have turned over this rx to [place]. She denies side effects. Denies confusion or dizziness with it or sedation. Denies change to her renal function at this time.   Review of Systems  Constitutional: Negative.   HENT: Negative.   Eyes: Negative.   Respiratory: Negative for cough, chest tightness and shortness of breath.   Cardiovascular: Negative for chest pain, palpitations and leg swelling.  Gastrointestinal: Negative for abdominal distention, abdominal pain, constipation, diarrhea, nausea and vomiting.  Musculoskeletal: Negative.   Skin: Negative.   Neurological: Negative.   Psychiatric/Behavioral: Negative.     Objective:  Physical Exam Constitutional:      Appearance: She is well-developed.  HENT:     Head: Normocephalic and atraumatic.  Neck:     Musculoskeletal: Normal range of motion.  Cardiovascular:     Rate and Rhythm: Normal rate and regular rhythm.  Pulmonary:     Effort: Pulmonary effort is normal. No respiratory distress.     Breath sounds: Normal breath sounds. No wheezing or rales.  Abdominal:     General: Bowel sounds are normal. There is no distension.     Palpations: Abdomen is soft.     Tenderness: There is no abdominal tenderness. There is no rebound.  Skin:    General: Skin is warm and dry.  Neurological:     Mental Status: She is alert and oriented to person, place, and time.     Coordination: Coordination normal.     Vitals:   12/11/18 1032  BP: 138/90  Pulse: 79  Temp: 98.9 F (37.2 C)  TempSrc: Oral  SpO2: 99%  Weight: 201 lb (91.2 kg)  Height: 5\' 7"  (1.702 m)    This visit occurred during the SARS-CoV-2 public  health emergency.  Safety protocols were in place, including screening questions prior to the visit, additional usage of staff PPE, and extensive cleaning of exam room while observing appropriate contact time as indicated for disinfecting solutions.   Assessment & Plan:

## 2018-12-25 DIAGNOSIS — T829XXA Unspecified complication of cardiac and vascular prosthetic device, implant and graft, initial encounter: Secondary | ICD-10-CM | POA: Diagnosis not present

## 2018-12-25 DIAGNOSIS — I129 Hypertensive chronic kidney disease with stage 1 through stage 4 chronic kidney disease, or unspecified chronic kidney disease: Secondary | ICD-10-CM | POA: Diagnosis not present

## 2018-12-25 DIAGNOSIS — D631 Anemia in chronic kidney disease: Secondary | ICD-10-CM | POA: Diagnosis not present

## 2018-12-25 DIAGNOSIS — Z940 Kidney transplant status: Secondary | ICD-10-CM | POA: Diagnosis not present

## 2018-12-25 DIAGNOSIS — Z79899 Other long term (current) drug therapy: Secondary | ICD-10-CM | POA: Diagnosis not present

## 2018-12-25 DIAGNOSIS — N2581 Secondary hyperparathyroidism of renal origin: Secondary | ICD-10-CM | POA: Diagnosis not present

## 2018-12-25 DIAGNOSIS — E785 Hyperlipidemia, unspecified: Secondary | ICD-10-CM | POA: Diagnosis not present

## 2018-12-25 DIAGNOSIS — M4802 Spinal stenosis, cervical region: Secondary | ICD-10-CM | POA: Diagnosis not present

## 2018-12-25 DIAGNOSIS — N189 Chronic kidney disease, unspecified: Secondary | ICD-10-CM | POA: Diagnosis not present

## 2019-02-22 DIAGNOSIS — Z940 Kidney transplant status: Secondary | ICD-10-CM | POA: Diagnosis not present

## 2019-02-22 DIAGNOSIS — I129 Hypertensive chronic kidney disease with stage 1 through stage 4 chronic kidney disease, or unspecified chronic kidney disease: Secondary | ICD-10-CM | POA: Diagnosis not present

## 2019-03-13 DIAGNOSIS — Z4822 Encounter for aftercare following kidney transplant: Secondary | ICD-10-CM | POA: Diagnosis not present

## 2019-03-13 DIAGNOSIS — R300 Dysuria: Secondary | ICD-10-CM | POA: Diagnosis not present

## 2019-03-13 DIAGNOSIS — Z8719 Personal history of other diseases of the digestive system: Secondary | ICD-10-CM | POA: Diagnosis not present

## 2019-03-13 DIAGNOSIS — Z79899 Other long term (current) drug therapy: Secondary | ICD-10-CM | POA: Diagnosis not present

## 2019-03-13 DIAGNOSIS — J449 Chronic obstructive pulmonary disease, unspecified: Secondary | ICD-10-CM | POA: Diagnosis not present

## 2019-03-13 DIAGNOSIS — I10 Essential (primary) hypertension: Secondary | ICD-10-CM | POA: Diagnosis not present

## 2019-03-13 DIAGNOSIS — Z940 Kidney transplant status: Secondary | ICD-10-CM | POA: Diagnosis not present

## 2019-03-13 DIAGNOSIS — E872 Acidosis: Secondary | ICD-10-CM | POA: Diagnosis not present

## 2019-03-13 DIAGNOSIS — Z792 Long term (current) use of antibiotics: Secondary | ICD-10-CM | POA: Diagnosis not present

## 2019-03-13 DIAGNOSIS — Z981 Arthrodesis status: Secondary | ICD-10-CM | POA: Diagnosis not present

## 2019-03-13 DIAGNOSIS — Z7952 Long term (current) use of systemic steroids: Secondary | ICD-10-CM | POA: Diagnosis not present

## 2019-03-13 DIAGNOSIS — Z87891 Personal history of nicotine dependence: Secondary | ICD-10-CM | POA: Diagnosis not present

## 2019-03-14 ENCOUNTER — Ambulatory Visit
Admission: RE | Admit: 2019-03-14 | Discharge: 2019-03-14 | Disposition: A | Discharge status: Home / Self Care | Payer: Medicare Other | Source: Ambulatory Visit | Attending: Internal Medicine | Admitting: Internal Medicine

## 2019-03-14 ENCOUNTER — Other Ambulatory Visit: Payer: Self-pay

## 2019-03-14 ENCOUNTER — Telehealth: Payer: Self-pay

## 2019-03-14 ENCOUNTER — Other Ambulatory Visit: Payer: Self-pay | Admitting: Internal Medicine

## 2019-03-14 DIAGNOSIS — Z1231 Encounter for screening mammogram for malignant neoplasm of breast: Secondary | ICD-10-CM

## 2019-03-14 DIAGNOSIS — Z1283 Encounter for screening for malignant neoplasm of skin: Secondary | ICD-10-CM

## 2019-03-14 NOTE — Telephone Encounter (Signed)
Pt has been informed.

## 2019-03-14 NOTE — Addendum Note (Signed)
Addended by: Hoyt Koch on: 03/14/2019 01:33 PM   Modules accepted: Orders

## 2019-03-14 NOTE — Telephone Encounter (Signed)
New message    The patient calling Transplant @ [facility] Dr. Criss Rosales the patient needs to have a referral to see a dermatologist for skin cancer & screening.   The patient is asking Dr. Sharlet Salina for a referral or does she do the screening in the office.

## 2019-03-14 NOTE — Telephone Encounter (Signed)
Referral placed.

## 2019-03-26 DIAGNOSIS — N2581 Secondary hyperparathyroidism of renal origin: Secondary | ICD-10-CM | POA: Diagnosis not present

## 2019-03-26 DIAGNOSIS — Z79899 Other long term (current) drug therapy: Secondary | ICD-10-CM | POA: Diagnosis not present

## 2019-03-26 DIAGNOSIS — Z940 Kidney transplant status: Secondary | ICD-10-CM | POA: Diagnosis not present

## 2019-03-26 DIAGNOSIS — D631 Anemia in chronic kidney disease: Secondary | ICD-10-CM | POA: Diagnosis not present

## 2019-03-26 DIAGNOSIS — M4802 Spinal stenosis, cervical region: Secondary | ICD-10-CM | POA: Diagnosis not present

## 2019-03-26 DIAGNOSIS — N189 Chronic kidney disease, unspecified: Secondary | ICD-10-CM | POA: Diagnosis not present

## 2019-03-26 DIAGNOSIS — I129 Hypertensive chronic kidney disease with stage 1 through stage 4 chronic kidney disease, or unspecified chronic kidney disease: Secondary | ICD-10-CM | POA: Diagnosis not present

## 2019-03-26 DIAGNOSIS — T829XXA Unspecified complication of cardiac and vascular prosthetic device, implant and graft, initial encounter: Secondary | ICD-10-CM | POA: Diagnosis not present

## 2019-03-26 DIAGNOSIS — E785 Hyperlipidemia, unspecified: Secondary | ICD-10-CM | POA: Diagnosis not present

## 2019-03-29 DIAGNOSIS — L821 Other seborrheic keratosis: Secondary | ICD-10-CM | POA: Diagnosis not present

## 2019-03-29 DIAGNOSIS — D2271 Melanocytic nevi of right lower limb, including hip: Secondary | ICD-10-CM | POA: Diagnosis not present

## 2019-03-29 DIAGNOSIS — D2262 Melanocytic nevi of left upper limb, including shoulder: Secondary | ICD-10-CM | POA: Diagnosis not present

## 2019-05-03 DIAGNOSIS — I129 Hypertensive chronic kidney disease with stage 1 through stage 4 chronic kidney disease, or unspecified chronic kidney disease: Secondary | ICD-10-CM | POA: Diagnosis not present

## 2019-05-03 DIAGNOSIS — Z940 Kidney transplant status: Secondary | ICD-10-CM | POA: Diagnosis not present

## 2019-06-19 DIAGNOSIS — I129 Hypertensive chronic kidney disease with stage 1 through stage 4 chronic kidney disease, or unspecified chronic kidney disease: Secondary | ICD-10-CM | POA: Diagnosis not present

## 2019-06-19 DIAGNOSIS — Z940 Kidney transplant status: Secondary | ICD-10-CM | POA: Diagnosis not present

## 2019-06-26 DIAGNOSIS — N189 Chronic kidney disease, unspecified: Secondary | ICD-10-CM | POA: Diagnosis not present

## 2019-06-26 DIAGNOSIS — I129 Hypertensive chronic kidney disease with stage 1 through stage 4 chronic kidney disease, or unspecified chronic kidney disease: Secondary | ICD-10-CM | POA: Diagnosis not present

## 2019-06-26 DIAGNOSIS — M4802 Spinal stenosis, cervical region: Secondary | ICD-10-CM | POA: Diagnosis not present

## 2019-06-26 DIAGNOSIS — Z79899 Other long term (current) drug therapy: Secondary | ICD-10-CM | POA: Diagnosis not present

## 2019-06-26 DIAGNOSIS — D631 Anemia in chronic kidney disease: Secondary | ICD-10-CM | POA: Diagnosis not present

## 2019-06-26 DIAGNOSIS — N2581 Secondary hyperparathyroidism of renal origin: Secondary | ICD-10-CM | POA: Diagnosis not present

## 2019-06-26 DIAGNOSIS — T829XXA Unspecified complication of cardiac and vascular prosthetic device, implant and graft, initial encounter: Secondary | ICD-10-CM | POA: Diagnosis not present

## 2019-06-26 DIAGNOSIS — E785 Hyperlipidemia, unspecified: Secondary | ICD-10-CM | POA: Diagnosis not present

## 2019-06-26 DIAGNOSIS — Z940 Kidney transplant status: Secondary | ICD-10-CM | POA: Diagnosis not present

## 2019-08-06 ENCOUNTER — Emergency Department (HOSPITAL_COMMUNITY): Payer: Medicare Other

## 2019-08-06 ENCOUNTER — Inpatient Hospital Stay (HOSPITAL_COMMUNITY)
Admission: EM | Admit: 2019-08-06 | Discharge: 2019-08-08 | DRG: 389 | Disposition: A | Discharge status: Other Acute Inpatient Hospital | Payer: Medicare Other | Attending: Internal Medicine | Admitting: Internal Medicine

## 2019-08-06 ENCOUNTER — Encounter (HOSPITAL_COMMUNITY): Payer: Self-pay

## 2019-08-06 ENCOUNTER — Inpatient Hospital Stay (HOSPITAL_COMMUNITY): Payer: Medicare Other

## 2019-08-06 DIAGNOSIS — M4802 Spinal stenosis, cervical region: Secondary | ICD-10-CM | POA: Diagnosis not present

## 2019-08-06 DIAGNOSIS — R011 Cardiac murmur, unspecified: Secondary | ICD-10-CM | POA: Diagnosis present

## 2019-08-06 DIAGNOSIS — R140 Abdominal distension (gaseous): Secondary | ICD-10-CM | POA: Diagnosis not present

## 2019-08-06 DIAGNOSIS — K5669 Other partial intestinal obstruction: Secondary | ICD-10-CM | POA: Diagnosis not present

## 2019-08-06 DIAGNOSIS — Z91041 Radiographic dye allergy status: Secondary | ICD-10-CM

## 2019-08-06 DIAGNOSIS — Z20822 Contact with and (suspected) exposure to covid-19: Secondary | ICD-10-CM | POA: Diagnosis present

## 2019-08-06 DIAGNOSIS — G992 Myelopathy in diseases classified elsewhere: Secondary | ICD-10-CM

## 2019-08-06 DIAGNOSIS — R1084 Generalized abdominal pain: Secondary | ICD-10-CM | POA: Diagnosis not present

## 2019-08-06 DIAGNOSIS — Y92002 Bathroom of unspecified non-institutional (private) residence single-family (private) house as the place of occurrence of the external cause: Secondary | ICD-10-CM

## 2019-08-06 DIAGNOSIS — Z87448 Personal history of other diseases of urinary system: Secondary | ICD-10-CM

## 2019-08-06 DIAGNOSIS — N2581 Secondary hyperparathyroidism of renal origin: Secondary | ICD-10-CM | POA: Diagnosis present

## 2019-08-06 DIAGNOSIS — I10 Essential (primary) hypertension: Secondary | ICD-10-CM | POA: Diagnosis not present

## 2019-08-06 DIAGNOSIS — R110 Nausea: Secondary | ICD-10-CM | POA: Diagnosis not present

## 2019-08-06 DIAGNOSIS — E038 Other specified hypothyroidism: Secondary | ICD-10-CM | POA: Diagnosis not present

## 2019-08-06 DIAGNOSIS — R1110 Vomiting, unspecified: Secondary | ICD-10-CM | POA: Diagnosis not present

## 2019-08-06 DIAGNOSIS — W1811XA Fall from or off toilet without subsequent striking against object, initial encounter: Secondary | ICD-10-CM | POA: Diagnosis present

## 2019-08-06 DIAGNOSIS — Z880 Allergy status to penicillin: Secondary | ICD-10-CM

## 2019-08-06 DIAGNOSIS — R55 Syncope and collapse: Secondary | ICD-10-CM | POA: Diagnosis not present

## 2019-08-06 DIAGNOSIS — Z87891 Personal history of nicotine dependence: Secondary | ICD-10-CM | POA: Diagnosis not present

## 2019-08-06 DIAGNOSIS — K861 Other chronic pancreatitis: Secondary | ICD-10-CM | POA: Diagnosis present

## 2019-08-06 DIAGNOSIS — F40240 Claustrophobia: Secondary | ICD-10-CM | POA: Diagnosis present

## 2019-08-06 DIAGNOSIS — Z79899 Other long term (current) drug therapy: Secondary | ICD-10-CM

## 2019-08-06 DIAGNOSIS — I129 Hypertensive chronic kidney disease with stage 1 through stage 4 chronic kidney disease, or unspecified chronic kidney disease: Secondary | ICD-10-CM | POA: Diagnosis present

## 2019-08-06 DIAGNOSIS — Z885 Allergy status to narcotic agent status: Secondary | ICD-10-CM

## 2019-08-06 DIAGNOSIS — J9811 Atelectasis: Secondary | ICD-10-CM | POA: Diagnosis not present

## 2019-08-06 DIAGNOSIS — K56609 Unspecified intestinal obstruction, unspecified as to partial versus complete obstruction: Secondary | ICD-10-CM

## 2019-08-06 DIAGNOSIS — Z886 Allergy status to analgesic agent status: Secondary | ICD-10-CM

## 2019-08-06 DIAGNOSIS — R109 Unspecified abdominal pain: Secondary | ICD-10-CM | POA: Diagnosis not present

## 2019-08-06 DIAGNOSIS — R58 Hemorrhage, not elsewhere classified: Secondary | ICD-10-CM | POA: Diagnosis not present

## 2019-08-06 DIAGNOSIS — K565 Intestinal adhesions [bands], unspecified as to partial versus complete obstruction: Principal | ICD-10-CM | POA: Diagnosis present

## 2019-08-06 DIAGNOSIS — K921 Melena: Secondary | ICD-10-CM | POA: Diagnosis not present

## 2019-08-06 DIAGNOSIS — K5790 Diverticulosis of intestine, part unspecified, without perforation or abscess without bleeding: Secondary | ICD-10-CM | POA: Diagnosis not present

## 2019-08-06 DIAGNOSIS — K5730 Diverticulosis of large intestine without perforation or abscess without bleeding: Secondary | ICD-10-CM | POA: Diagnosis not present

## 2019-08-06 DIAGNOSIS — M1990 Unspecified osteoarthritis, unspecified site: Secondary | ICD-10-CM | POA: Diagnosis present

## 2019-08-06 DIAGNOSIS — Z888 Allergy status to other drugs, medicaments and biological substances status: Secondary | ICD-10-CM

## 2019-08-06 DIAGNOSIS — Z940 Kidney transplant status: Secondary | ICD-10-CM | POA: Diagnosis not present

## 2019-08-06 DIAGNOSIS — K56699 Other intestinal obstruction unspecified as to partial versus complete obstruction: Secondary | ICD-10-CM | POA: Diagnosis not present

## 2019-08-06 DIAGNOSIS — N1831 Chronic kidney disease, stage 3a: Secondary | ICD-10-CM

## 2019-08-06 DIAGNOSIS — R112 Nausea with vomiting, unspecified: Secondary | ICD-10-CM | POA: Diagnosis not present

## 2019-08-06 DIAGNOSIS — I700 Atherosclerosis of aorta: Secondary | ICD-10-CM | POA: Diagnosis not present

## 2019-08-06 DIAGNOSIS — K625 Hemorrhage of anus and rectum: Secondary | ICD-10-CM | POA: Diagnosis not present

## 2019-08-06 DIAGNOSIS — G4710 Hypersomnia, unspecified: Secondary | ICD-10-CM | POA: Diagnosis present

## 2019-08-06 DIAGNOSIS — E039 Hypothyroidism, unspecified: Secondary | ICD-10-CM | POA: Diagnosis present

## 2019-08-06 DIAGNOSIS — M47816 Spondylosis without myelopathy or radiculopathy, lumbar region: Secondary | ICD-10-CM | POA: Diagnosis not present

## 2019-08-06 DIAGNOSIS — K922 Gastrointestinal hemorrhage, unspecified: Secondary | ICD-10-CM | POA: Diagnosis present

## 2019-08-06 DIAGNOSIS — Z7952 Long term (current) use of systemic steroids: Secondary | ICD-10-CM

## 2019-08-06 DIAGNOSIS — E785 Hyperlipidemia, unspecified: Secondary | ICD-10-CM | POA: Diagnosis present

## 2019-08-06 DIAGNOSIS — E860 Dehydration: Secondary | ICD-10-CM | POA: Diagnosis present

## 2019-08-06 DIAGNOSIS — K8681 Exocrine pancreatic insufficiency: Secondary | ICD-10-CM | POA: Diagnosis present

## 2019-08-06 DIAGNOSIS — Z981 Arthrodesis status: Secondary | ICD-10-CM

## 2019-08-06 DIAGNOSIS — J986 Disorders of diaphragm: Secondary | ICD-10-CM | POA: Diagnosis not present

## 2019-08-06 DIAGNOSIS — Z4682 Encounter for fitting and adjustment of non-vascular catheter: Secondary | ICD-10-CM | POA: Diagnosis not present

## 2019-08-06 DIAGNOSIS — K6389 Other specified diseases of intestine: Secondary | ICD-10-CM | POA: Diagnosis not present

## 2019-08-06 DIAGNOSIS — Z0189 Encounter for other specified special examinations: Secondary | ICD-10-CM

## 2019-08-06 DIAGNOSIS — N200 Calculus of kidney: Secondary | ICD-10-CM | POA: Diagnosis not present

## 2019-08-06 DIAGNOSIS — N179 Acute kidney failure, unspecified: Secondary | ICD-10-CM | POA: Diagnosis present

## 2019-08-06 DIAGNOSIS — Z7982 Long term (current) use of aspirin: Secondary | ICD-10-CM

## 2019-08-06 DIAGNOSIS — S3992XA Unspecified injury of lower back, initial encounter: Secondary | ICD-10-CM | POA: Diagnosis not present

## 2019-08-06 DIAGNOSIS — M542 Cervicalgia: Secondary | ICD-10-CM | POA: Diagnosis not present

## 2019-08-06 DIAGNOSIS — M419 Scoliosis, unspecified: Secondary | ICD-10-CM | POA: Diagnosis not present

## 2019-08-06 LAB — CBC
HCT: 43.7 % (ref 36.0–46.0)
HCT: 44.3 % (ref 36.0–46.0)
Hemoglobin: 13.5 g/dL (ref 12.0–15.0)
Hemoglobin: 13.7 g/dL (ref 12.0–15.0)
MCH: 30.3 pg (ref 26.0–34.0)
MCH: 29.8 pg (ref 26.0–34.0)
MCHC: 30.9 g/dL (ref 30.0–36.0)
MCHC: 30.9 g/dL (ref 30.0–36.0)
MCV: 98 fL (ref 80.0–100.0)
MCV: 96.5 fL (ref 80.0–100.0)
Platelets: 249 10*3/uL (ref 150–400)
Platelets: 241 10*3/uL (ref 150–400)
RBC: 4.46 MIL/uL (ref 3.87–5.11)
RBC: 4.59 MIL/uL (ref 3.87–5.11)
RDW: 13.3 % (ref 11.5–15.5)
RDW: 13.5 % (ref 11.5–15.5)
WBC: 7.4 10*3/uL (ref 4.0–10.5)
WBC: 9.4 10*3/uL (ref 4.0–10.5)
nRBC: 0 % (ref 0.0–0.2)
nRBC: 0 % (ref 0.0–0.2)

## 2019-08-06 LAB — COMPREHENSIVE METABOLIC PANEL
ALT: 10 U/L (ref 0–44)
AST: 22 U/L (ref 15–41)
Albumin: 4.5 g/dL (ref 3.5–5.0)
Alkaline Phosphatase: 44 U/L (ref 38–126)
Anion gap: 11 (ref 5–15)
BUN: 18 mg/dL (ref 6–20)
CO2: 22 mmol/L (ref 22–32)
Calcium: 10.4 mg/dL — ABNORMAL HIGH (ref 8.9–10.3)
Chloride: 104 mmol/L (ref 98–111)
Creatinine, Ser: 1.64 mg/dL — ABNORMAL HIGH (ref 0.44–1.00)
GFR calc Af Amer: 40 mL/min — ABNORMAL LOW (ref >60)
GFR calc non Af Amer: 34 mL/min — ABNORMAL LOW (ref >60)
Glucose, Bld: 106 mg/dL — ABNORMAL HIGH (ref 70–99)
Potassium: 4.2 mmol/L (ref 3.5–5.1)
Sodium: 137 mmol/L (ref 135–145)
Total Bilirubin: 0.8 mg/dL (ref 0.3–1.2)
Total Protein: 7.6 g/dL (ref 6.5–8.1)

## 2019-08-06 LAB — CREATININE, SERUM
Creatinine, Ser: 1.7 mg/dL — ABNORMAL HIGH (ref 0.44–1.00)
GFR calc Af Amer: 38 mL/min — ABNORMAL LOW (ref >60)
GFR calc non Af Amer: 33 mL/min — ABNORMAL LOW (ref >60)

## 2019-08-06 LAB — TYPE AND SCREEN
ABO/RH(D): O POS
Antibody Screen: NEGATIVE

## 2019-08-06 LAB — SARS CORONAVIRUS 2 BY RT PCR (HOSPITAL ORDER, PERFORMED IN ~~LOC~~ HOSPITAL LAB): SARS Coronavirus 2: NEGATIVE

## 2019-08-06 LAB — I-STAT BETA HCG BLOOD, ED (MC, WL, AP ONLY): I-stat hCG, quantitative: 5.6 m[IU]/mL — ABNORMAL HIGH (ref <5)

## 2019-08-06 LAB — LIPASE, BLOOD: Lipase: 32 U/L (ref 11–51)

## 2019-08-06 LAB — HIV ANTIBODY (ROUTINE TESTING W REFLEX): HIV Screen 4th Generation wRfx: NONREACTIVE

## 2019-08-06 MED ORDER — TACROLIMUS 1 MG/ML ORAL SUSPENSION
0.5000 mg | Freq: Two times a day (BID) | ORAL | Status: DC
  Filled 2019-08-06: qty 0.5

## 2019-08-06 MED ORDER — HEPARIN SODIUM (PORCINE) 5000 UNIT/ML IJ SOLN
5000.0000 [IU] | Freq: Three times a day (TID) | INTRAMUSCULAR | Status: DC
  Administered 2019-08-06 – 2019-08-07 (×5): 5000 [IU] via SUBCUTANEOUS
  Filled 2019-08-06 (×5): qty 1

## 2019-08-06 MED ORDER — ONDANSETRON HCL 4 MG/2ML IJ SOLN
4.0000 mg | Freq: Four times a day (QID) | INTRAMUSCULAR | Status: DC | PRN
  Administered 2019-08-06 – 2019-08-07 (×4): 4 mg via INTRAVENOUS
  Filled 2019-08-06 (×4): qty 2

## 2019-08-06 MED ORDER — SODIUM CHLORIDE 0.9 % IV SOLN: INTRAVENOUS | Status: DC

## 2019-08-06 MED ORDER — ONDANSETRON HCL 4 MG/2ML IJ SOLN
4.0000 mg | Freq: Once | INTRAMUSCULAR | Status: AC
  Administered 2019-08-06: 4 mg via INTRAVENOUS
  Filled 2019-08-06: qty 2

## 2019-08-06 MED ORDER — ONDANSETRON HCL 4 MG PO TABS: 4.0000 mg | ORAL_TABLET | Freq: Four times a day (QID) | ORAL | Status: DC | PRN

## 2019-08-06 MED ORDER — LORAZEPAM 2 MG/ML IJ SOLN: 0.5000 mg | Freq: Two times a day (BID) | INTRAMUSCULAR | Status: DC | PRN

## 2019-08-06 MED ORDER — ACETAMINOPHEN 650 MG RE SUPP: 650.0000 mg | Freq: Four times a day (QID) | RECTAL | Status: DC | PRN

## 2019-08-06 MED ORDER — TACROLIMUS 1 MG/ML ORAL SUSPENSION
2.5000 mg | Freq: Two times a day (BID) | ORAL | Status: DC
  Administered 2019-08-06 – 2019-08-07 (×3): 2.5 mg via ORAL
  Filled 2019-08-06 (×6): qty 2.5

## 2019-08-06 MED ORDER — ALBUTEROL SULFATE (2.5 MG/3ML) 0.083% IN NEBU: 2.5000 mg | INHALATION_SOLUTION | RESPIRATORY_TRACT | Status: DC | PRN

## 2019-08-06 MED ORDER — HYDRALAZINE HCL 20 MG/ML IJ SOLN
10.0000 mg | INTRAMUSCULAR | Status: DC | PRN
  Administered 2019-08-07: 10 mg via INTRAVENOUS
  Filled 2019-08-06 (×2): qty 1

## 2019-08-06 MED ORDER — ACETAMINOPHEN 325 MG PO TABS: 650.0000 mg | ORAL_TABLET | Freq: Four times a day (QID) | ORAL | Status: DC | PRN

## 2019-08-06 MED ORDER — FENTANYL CITRATE (PF) 100 MCG/2ML IJ SOLN
100.0000 ug | Freq: Once | INTRAMUSCULAR | Status: AC
  Administered 2019-08-06: 100 ug via INTRAVENOUS
  Filled 2019-08-06: qty 2

## 2019-08-06 MED ORDER — FENTANYL CITRATE (PF) 100 MCG/2ML IJ SOLN
12.5000 ug | INTRAMUSCULAR | Status: DC | PRN
  Administered 2019-08-06 – 2019-08-08 (×7): 50 ug via INTRAVENOUS
  Filled 2019-08-06 (×7): qty 2

## 2019-08-06 MED ORDER — HYDROCORTISONE NA SUCCINATE PF 100 MG IJ SOLR
50.0000 mg | Freq: Three times a day (TID) | INTRAMUSCULAR | Status: DC
  Administered 2019-08-06 – 2019-08-07 (×5): 50 mg via INTRAVENOUS
  Filled 2019-08-06 (×5): qty 2

## 2019-08-06 MED ORDER — MYCOPHENOLATE MOFETIL HCL 500 MG IV SOLR
500.0000 mg | Freq: Two times a day (BID) | INTRAVENOUS | Status: DC
  Administered 2019-08-06 – 2019-08-07 (×3): 500 mg via INTRAVENOUS
  Filled 2019-08-06 (×7): qty 15

## 2019-08-06 MED ORDER — DIATRIZOATE MEGLUMINE & SODIUM 66-10 % PO SOLN
90.0000 mL | Freq: Once | ORAL | Status: AC
  Administered 2019-08-06: 90 mL via NASOGASTRIC
  Filled 2019-08-06: qty 90

## 2019-08-06 NOTE — Consult Note (Signed)
[facility] Consult Note  [name] Jan 08, 1961  [id number].    Requesting MD: Dorie Rank Chief Complaint/Reason for Consult: SBO  HPI:  [name] is a 59yo female h/o glomerulonephritis previously on HD s/p kidney transplant x2 1997 and 01/2017 at [facility] on prednisone 7.5mg  daily and prograf, who presented to [facility] early this morning complaining of abdominal pain. States that she ate fried [place] and later in the afternoon she developed diffuse abdominal pain. Pain is constant and gradually became more severe. Associated with multiple episodes of n/v as well as abdominal bloating. States that she passed gas once yesterday and had 1 loose BM, but has had neither today. Denies fever or chills. No prior h/o SBO.  ED work up included CT scan which shows distal small bowel obstruction with transition zone in the RIGHT upper pelvis, favor adhesion. WBC 7.4, Hgb 13.5, Cr 1.64. General surgery asked to see.  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   Respiratory: Negative.   Cardiovascular: Negative.   Gastrointestinal: Positive for abdominal pain, constipation, diarrhea, nausea and vomiting.  Genitourinary: Negative.     All systems reviewed and otherwise negative except for as above  Family History  Adopted: Yes  Problem Relation Age of Onset   Diabetes Maternal Aunt        x2   Diabetes Maternal Uncle     Past Medical History:  Diagnosis Date   Acute blood loss anemia    Anemia    Anxiety    Arthritis    Claustrophobia    Diverticulosis    ESRD (end stage renal disease) on dialysis ([place])    "TTS; [place]" (03/24/2015)   GI bleed    Glomerulonephritis    Heart murmur    HTN (hypertension)    Hypothyroidism    Kidney transplant recipient 01/21/2017   Pancreatitis    Renal insufficiency    Secondary hyperparathyroidism ([place])     [name] 05/11/2014    Past Surgical History:  Procedure Laterality Date   ANTERIOR CERVICAL CORPECTOMY N/A 05/24/2017   Procedure: CORPECTOMY CERVICAL FIVE- CERVICAL SIX;  Surgeon: Ashok Pall, MD;  Location: [place];  Service: Neurosurgery;  Laterality: N/A;   ARTERIOVENOUS GRAFT PLACEMENT Right 1990's?   ARTERIOVENOUS GRAFT PLACEMENT Left 07/2002   upper arm/notes 06/02/2010   ARTERIOVENOUS GRAFT PLACEMENT Right 07/2003   upper arm/notes 06/02/2010   AV FISTULA PLACEMENT Left 09/2000   upper arm/notes 06/02/2010   BREAST BIOPSY Left unsure   benign   COLONOSCOPY     DILATATION & CURRETTAGE/HYSTEROSCOPY WITH RESECTOCOPE N/A 04/17/2013   Procedure: DILATATION & CURETTAGE/HYSTEROSCOPY WITH RESECTOCOPE;  Surgeon: Marvene Staff, MD;  Location: [facility];  Service: Gynecology;  Laterality: N/A;   DILATION AND CURETTAGE OF UTERUS     EYE SURGERY     KIDNEY TRANSPLANT  1997   KIDNEY TRANSPLANT  01/21/2017   Dr. Harrington Challenger   PARATHYROIDECTOMY  03/2000 05/12/2014   w/neck exploration & autotransplantation/notes 06/02/2010; w/neck exploration   PARATHYROIDECTOMY N/A 05/12/2014   Procedure: PARATHYROIDECTOMY AND NECK EXPLORATION;  Surgeon: Armandina Gemma, MD;  Location: [place];  Service: General;  Laterality: N/A;   PERITONEAL CATHETER INSERTION     PERITONEAL CATHETER REMOVAL  08/1999   [name] 06/02/2010   POSTERIOR CERVICAL FUSION/FORAMINOTOMY N/A 05/30/2017   Procedure: POSTERIOR CERVICAL Arthrodesis Cervical three - four, cervical four - five, cervical five - six,  cervical six - seven;  Surgeon: Ashok Pall, MD;  Location: [place];  Service: Neurosurgery;  Laterality: N/A;   POSTERIOR CERVICAL LAMINECTOMY N/A 08/12/2016   Procedure: POSTERIOR CERVICAL LAMINECTOMY CERVICAL THREE CERVICAL FOUR, CERVICAL FOUR CERVICAL FIVE, CERVICAL FIVE- CERVICAL SIX, CERVICAL SIX- CERVICAL SEVEN;  Surgeon: Ashok Pall, MD;  Location: [place];  Service: Neurosurgery;  Laterality: N/A;  POSTERIOR   RETINAL  DETACHMENT SURGERY Left    THROMBECTOMY Left 02/2002   fistula/notes 06/02/2010   THROMBECTOMY / ARTERIOVENOUS GRAFT REVISION  11/2003; 01/2004; 08/07/2005; 08/10/2005; 10/2005   [name] 06/02/2010   THROMBECTOMY AND REVISION OF ARTERIOVENTOUS (AV) GORETEX  GRAFT Right 01/2002; 11/2003; 01/2004; 08/07/2004; 08/10/2004; 11/13/2005   [name] 5/1/2012Marland Kitchen [name] 5/16/2012Marland Kitchen [name] 5/16/2012Marland Kitchen [name] 06/02/2010; [name] 06/02/2010; [name] 06/02/2010   THROMBECTOMY AND REVISION OF ARTERIOVENTOUS (AV) GORETEX  GRAFT Left 08/23/2002; 09/13/2002; 07/08/2003   [name] 06/02/2010; [name] 06/02/2010; [name] 06/02/2010    Social History:  reports that she quit smoking about 2 years ago. Her smoking use included cigarettes. She has a 3.60 pack-year smoking history. She has never used smokeless tobacco. She reports that she does not drink alcohol and does not use drugs.  Allergies:  Allergies  Allergen Reactions   Contrast Media [Iodinated Diagnostic Agents] Other (See Comments)    Cant take due to kidney transplant   Nsaids Other (See Comments)    Cant take due to kidney transplant    Penicillins Hives and Other (See Comments)    PATIENT HAS HAD A PCN REACTION WITH IMMEDIATE RASH, FACIAL/TONGUE/THROAT SWELLING, SOB, OR LIGHTHEADEDNESS WITH HYPOTENSION:  #  #  YES  #  #  Has patient had a PCN reaction causing severe rash involving mucus membranes or skin necrosis: No Has patient had a PCN reaction that required hospitalization No Has patient had a PCN reaction occurring within the last 10 years: No If all of the above answers are "NO", then may proceed with Cephalosporin   Ace Inhibitors Hives and Nausea And Vomiting   Codeine Hives    (Not in a hospital admission)   Prior to Admission medications   Medication Sig Start Date End Date Taking? Authorizing Provider  acetaminophen (TYLENOL) 500 MG tablet Take 1,000 mg by mouth every 6 (six) hours as needed for moderate pain or headache.    Yes [provider]   ALPRAZolam (XANAX) 0.25 MG tablet Take 1 tablet (0.25 mg total) by mouth daily as needed for anxiety. 12/11/18  Yes Hoyt Koch, MD  aspirin 81 MG chewable tablet Chew 81 mg by mouth daily.   Yes [provider]  b complex-vitamin c-folic acid (NEPHRO-VITE) 0.8 MG TABS tablet Take 1 tablet by mouth daily.   Yes [provider]  gabapentin (NEURONTIN) 300 MG capsule Take 2 capsules (600 mg total) by mouth 2 (two) times daily as needed (nerve pain). 12/11/18  Yes Hoyt Koch, MD  mycophenolate (MYFORTIC) 180 MG EC tablet Take 720 mg by mouth 2 (two) times daily.    Yes [provider]  polyethylene glycol (MIRALAX / GLYCOLAX) packet Take 17 g by mouth daily. Patient taking differently: Take 17 g by mouth daily as needed for mild constipation.  10/01/17  Yes Thurnell Lose, MD  predniSONE (DELTASONE) 2.5 MG tablet Take 2.5 mg by mouth every morning. Take with 5mg  qd to =7.5mg  qd total   Yes [provider]  predniSONE (DELTASONE) 5 MG tablet Take 5 mg by mouth daily. Take with 2.5mg  to = 7.5mg  qd total  07/23/19  Yes [provider]  PROGRAF 0.5 MG capsule Take 0.5 mg by mouth 2 (two) times daily. 07/23/19  Yes [provider]  rosuvastatin (CRESTOR) 5 MG tablet Take 5 mg by mouth daily. 04/24/19  Yes [provider]  sodium bicarbonate 650 MG tablet Take 650 mg by mouth daily.    Yes [provider]  sulfamethoxazole-trimethoprim (BACTRIM,SEPTRA) 400-80 MG tablet Take 1 tablet by mouth every Monday, Wednesday, and Friday.   Yes [provider]  tacrolimus (PROGRAF) 1 MG capsule Take 2 mg by mouth in the morning and at bedtime.    Yes [provider]    Blood pressure (!) 148/60, pulse 86, temperature 98.5 F (36.9 C), temperature source Oral, resp. rate 14, SpO2 97 %. Physical Exam: General: pleasant but drowsy, WD/WN black female who is laying in bed in NAD HEENT: head is normocephalic,  atraumatic.  Sclera are noninjected.  Pupils equal and round.  Ears and nose without any masses or lesions.  Mouth is pink and moist. Dentition fair Heart: regular, rate, and rhythm.  Normal s1,s2. No obvious murmurs, gallops, or rubs noted.  Palpable pedal pulses bilaterally  Lungs: CTAB, no wheezes, rhonchi, or rales noted.  Respiratory effort nonlabored Abd: multiple well healed abdominal incisions, distended but soft, mild diffuse tenderness without rebound or guarding, hypoactive BS, no masses, hernias, or organomegaly MS: no BUE/BLE edema, calves soft and nontender Skin: warm and dry with no masses, lesions, or rashes Psych: A&Ox4 with an appropriate affect Neuro: cranial nerves grossly intact, equal strength in BUE/BLE bilaterally, normal speech, thought process intact  Results for orders placed or performed during the hospital encounter of 08/06/19 (from the past 48 hour(s))  Type and screen [place]     Status: None   Collection Time: 08/06/19  2:14 AM  Result Value Ref Range   ABO/RH(D) O POS    Antibody Screen NEG    Sample Expiration      08/09/2019,2359 Performed at [facility], [place] [address], [place], [place] [address]   Comprehensive metabolic panel     Status: Abnormal   Collection Time: 08/06/19  2:16 AM  Result Value Ref Range   Sodium 137 135 - 145 mmol/L   Potassium 4.2 3.5 - 5.1 mmol/L   Chloride 104 98 - 111 mmol/L   CO2 22 22 - 32 mmol/L   Glucose, Bld 106 (H) 70 - 99 mg/dL    Comment: Glucose reference range applies only to samples taken after fasting for at least 8 hours.   BUN 18 6 - 20 mg/dL   Creatinine, Ser 1.64 (H) 0.44 - 1.00 mg/dL   Calcium 10.4 (H) 8.9 - 10.3 mg/dL   Total Protein 7.6 6.5 - 8.1 g/dL   Albumin 4.5 3.5 - 5.0 g/dL   AST 22 15 - 41 U/L   ALT 10 0 - 44 U/L   Alkaline Phosphatase 44 38 - 126 U/L   Total Bilirubin 0.8 0.3 - 1.2 mg/dL   GFR calc non Af Amer 34 (L) >60 mL/min   GFR calc Af Amer 40 (L) >60  mL/min   Anion gap 11 5 - 15    Comment: Performed at [facility] [address], [place] [address]  CBC     Status: None   Collection Time: 08/06/19  2:16 AM  Result Value Ref Range   WBC 7.4 4.0 - 10.5 K/uL   RBC 4.46 3.87 -  5.11 MIL/uL   Hemoglobin 13.5 12.0 - 15.0 g/dL   HCT 43.7 36 - 46 %   MCV 98.0 80.0 - 100.0 fL   MCH 30.3 26.0 - 34.0 pg   MCHC 30.9 30.0 - 36.0 g/dL   RDW 13.3 11.5 - 15.5 %   Platelets 249 150 - 400 K/uL   nRBC 0.0 0.0 - 0.2 %    Comment: Performed at [facility], [place] [address], [place], [place] [address]  Lipase, blood     Status: None   Collection Time: 08/06/19  2:16 AM  Result Value Ref Range   Lipase 32 11 - 51 U/L    Comment: Performed at [facility] [address] [place], [place] [address]  I-Stat beta hCG blood, ED     Status: Abnormal   Collection Time: 08/06/19  2:42 AM  Result Value Ref Range   I-stat hCG, quantitative 5.6 (H) <5 mIU/mL   Comment 3            Comment:   GEST. AGE      CONC.  (mIU/mL)   <=1 WEEK        5 - 50     2 WEEKS       50 - 500     3 WEEKS       100 - 10,000     4 WEEKS     1,000 - 30,000        FEMALE AND NON-PREGNANT FEMALE:     LESS THAN 5 mIU/mL    CT ABDOMEN PELVIS WO CONTRAST  Result Date: 08/06/2019 CLINICAL DATA:  Abdominal pain all over with nausea vomiting for 2 days, history pancreatitis EXAM: CT ABDOMEN AND PELVIS WITHOUT CONTRAST TECHNIQUE: Multidetector CT imaging of the abdomen and pelvis was performed following the standard protocol without IV contrast. IV contrast not administered due to abnormal renal function. No oral contrast administered. Sagittal and coronal MPR images reconstructed from axial data set. COMPARISON:  09/29/2017 FINDINGS: Lower chest: Mitral annular calcification. Visualized lung bases clear Hepatobiliary: Gallbladder and liver unremarkable Pancreas: Normal appearance without mass or surrounding inflammatory changes. No calcifications or definite  ductal dilatation. Spleen: Capsular calcification at spleen question sequela of prior hemorrhage or trauma. No focal mass. Adrenals/Urinary Tract: Unremarkable adrenal glands. Atrophic kidneys consistent with end-stage renal disease. Tiny nonobstructing calculus at inferior pole LEFT kidney. Complicated cyst arising from lateral aspect mid to upper LEFT kidney 16 x 12 mm, stable size though some demonstrating slightly greater calcification. Transplant kidney RIGHT iliac fossa without mass or hydronephrosis. Bladder unremarkable. Stomach/Bowel: Stomach normal appearance. Scattered colonic diverticulosis without evidence of diverticulitis. Colon otherwise unremarkable. Appendix not visualized but no pericecal inflammatory process seen. Terminal ileum decompressed. Remaining small bowel diffusely dilated consistent with distal small bowel obstruction. Transition zone is in the RIGHT upper pelvis approximately image 58. No bowel wall thickening. Vascular/Lymphatic: Extensive atherosclerotic calcifications aorta, iliac arteries, coronary arteries. Aorta normal caliber. Extensive calcifications along the LEFT external iliac vessels question related to prior surgery. No adenopathy. Reproductive: Unremarkable uterus.  No adnexal masses. Other: No free air or free fluid.  No hernia. Musculoskeletal: Superior endplate deformities of T11 and T12 unchanged. IMPRESSION: Distal small bowel obstruction with transition zone in the RIGHT upper pelvis, favor adhesion. Colonic diverticulosis without evidence of diverticulitis. Transplant kidney RIGHT iliac fossa without hydronephrosis. Atrophic kidneys consistent with end-stage renal disease. Extensive atherosclerotic calcifications. Aortic Atherosclerosis (ICD10-I70.0). Electronically Signed   By: Lavonia Dana  M.D.   On: 08/06/2019 10:36   DG Lumbar Spine Complete  Result Date: 08/06/2019 CLINICAL DATA:  Fall. Additional provided: Sharp pain lower back (right greater than left).  EXAM: LUMBAR SPINE - COMPLETE 4+ VIEW COMPARISON:  CT abdomen/pelvis 09/29/2017. FINDINGS: Five lumbar vertebrae in correlating with prior CT 09/29/2017. No significant spondylolisthesis. Redemonstrated mild chronic T11 and T12 vertebral body height loss with unchanged superior endplate irregularity at these levels. No lumbar compression deformity. Mild multilevel disc space narrowing, greatest at L5-S1. Mild lower lumbar facet arthrosis. Aortic atherosclerosis. IMPRESSION: No lumbar compression deformity is demonstrated. Unchanged mild chronic T11 and T12 vertebral body height loss. Mild lumbar spondylosis as outlined, greatest within the lower lumbar spine. Electronically Signed   By: Kellie Simmering DO   On: 08/06/2019 10:01   CT Cervical Spine Wo Contrast  Result Date: 08/06/2019 CLINICAL DATA:  Acute neck pain EXAM: CT CERVICAL SPINE WITHOUT CONTRAST TECHNIQUE: Multidetector CT imaging of the cervical spine was performed without intravenous contrast. Multiplanar CT image reconstructions were also generated. COMPARISON:  None. FINDINGS: Alignment: Normal Skull base and vertebrae: There is anterior fusion from C4-C7 and posterior fusion from C3 to C7. There is abnormal lucency surrounding the transpedicular screws at C3. No other abnormal perihardware lucency. The spinal canal is posteriorly decompressed from C3 to C5. Soft tissues and spinal canal: No prevertebral fluid or swelling. No visible canal hematoma. Disc levels:  No bony spinal canal stenosis. Upper chest: Negative. Other: None. IMPRESSION: 1. No acute fracture or static subluxation of the cervical spine. 2. Anterior and posterior fusion from C3 to C7 with abnormal lucency surrounding the transpedicular screws at C3, consistent with loosening. Electronically Signed   By: Ulyses Jarred M.D.   On: 08/06/2019 03:38   DG Chest Portable 1 View  Result Date: 08/06/2019 CLINICAL DATA:  NG tube placement. History of abdominal pain and rectal bleeding,  nausea and vomiting. EXAM: PORTABLE CHEST 1 VIEW COMPARISON:  September 29, 2017 FINDINGS: Film is rotated to the RIGHT. Postoperative changes are noted about the thoracic inlet and in the lower cervical spine and upper thoracic spine, incompletely imaged. Cardiomediastinal contours accounting for degree of rotation are unchanged. Subtle basilar opacities are demonstrated with elevation of LEFT hemidiaphragm as compared to previous imaging, slightly elevated compared to the LEFT. No dense consolidation. Signs of stenting along the LEFT upper extremity tracking into the axilla with similar appearance. Gastric tube in place tip off the field of the radiograph. Tube courses below the LEFT hemidiaphragm. Dextroconvex curvature of the thoracic spine similar to the prior study. No acute osseous abnormality on limited assessment. IMPRESSION: 1. Subtle basilar opacities with elevation of LEFT hemidiaphragm as compared to previous imaging. May represent basilar atelectasis or developing infection. Correlate with any clinical risk for aspiration. Electronically Signed   By: Zetta Bills M.D.   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 NG tube has been placed for decompression. Will start patient on small bowel protocol. We will  follow.  Patient does have iodinated contrast media listed as allergy - she reports no true allergy but that she was told to be cautious with contrast due to kidney transplant. I discussed this with pharmacy  and nephrology who felt that PO gastrograffin would be safe.  Wellington Hampshire, [facility] 08/06/2019, 12:54 PM Please see [facility] for pager number during day hours 7:00am-4:30pm

## 2019-08-06 NOTE — ED Notes (Signed)
Got patient into a gown on the monitor patient is resting with call bell in reach 

## 2019-08-06 NOTE — ED Triage Notes (Signed)
Pt comes via [facility] from home, used to be a dialysis pt, had a kidney transplant 3 years ago, today began to have abd pain, rectal bleeding, and n/v. Hx of pancreatitis. PTA received 4mg  zofran and 100 mcg fentanyl. Pt states that she also got dizzy and had a fall tonight, did not hit her head but felt a pop in her neck, c/o of neck pain.

## 2019-08-06 NOTE — H&P (Addendum)
History and Physical    DOA: 08/06/2019  PCP: Hoyt Koch, MD  Patient coming from: home  Chief Complaint: Nausea vomiting and abdominal pain for 1 day  HPI: [name] is a 59 y.o. female with history h/o ESRD s/p renal transplant, hypertension, hypothyroidism, diverticulosis/GI bleed, anxiety and chronic idiopathic pancreatitis presents with complaints of nausea vomiting and abdominal pain that started yesterday.  Patient states she started feeling nauseous earlier in the day yesterday.  It progressively worsened later in the afternoon resulting in several episodes of vomiting.  She also reports associated diffuse abdominal pain 10/10, nonradiating as well as loose stools with mild hematochezia.  Patient reports noticing abdominal bloating prior to presenting to ED.  She states when EMS arrived they did express concern regarding abdominal distention.  Since she has been in the ED, she has had more episodes of vomiting but no bowel movement.  Denies any fevers.  States had history of diverticulosis and associated GI bleed but never diagnosed with hemorrhoids, although reports noticing occasional blood in stools.  Patient does report a near syncopal event yesterday evening while getting up from the commode, after she had several bouts of vomiting/diarrhea.  She states she felt dizzy and held onto the wall and in the process, twisted her head and neck towards the wall.  She noted a popping sensation in her neck associated with some pain thereafter.  Currently denies any neck pain or headache. ED course: Afebrile, pulse 80, respiratory rate 18, BP 156/81.  Pulse ox 100% on room air.  CBC within normal limits with WBC 7.4, hemoglobin 13.5, BMP shows chronic kidney disease with BUN 18, creatinine 1.64.  BG 106, sodium/potassium WNL.  Lipase 32.Chest x-ray showed Subtle basilar opacities with elevation of LEFT hemidiaphragm as compared to previous imaging, likely representing atelectasis.  CT  cervical spine did not show any acute fracture or subluxation but reported "Anterior and posterior fusion from C3 to C7 with abnormal lucency surrounding the transpedicular screws at C3, consistent with loosening." CT abdomen without contrast revealed "Distal small bowel obstruction with transition zone in the RIGHT upper pelvis, favor adhesion.Colonic diverticulosis without evidence of diverticulitis.Transplant kidney RIGHT iliac fossa without hydronephrosis.  Atrophic kidneys consistent with end-stage renal disease.  Extensive atherosclerosis".  Patient underwent NG tube placement in the ED and feels somewhat improved.  She also received IV Zofran/IV fentanyl100 mcg for abdominal pain, currently somnolent and falling asleep during the interview but easily arousable and able to answer questions appropriately.  She currently reports abdominal pain to be 0 out of 10, but c/o dry mouth.  General surgery being contacted by EDP.   Review of Systems: As per HPI otherwise 10 point review of systems negative.    Past Medical History:  Diagnosis Date  . Acute blood loss anemia   . Anemia   . Anxiety   . Arthritis   . Claustrophobia   . Diverticulosis   . ESRD (end stage renal disease) on dialysis ([place])    "TTS; [place]" (03/24/2015)  . GI bleed   . Glomerulonephritis   . Heart murmur   . HTN (hypertension)   . Hypothyroidism   . Kidney transplant recipient 01/21/2017  . Pancreatitis   . Renal insufficiency   . Secondary hyperparathyroidism ([place])    [name] 05/11/2014    Past Surgical History:  Procedure Laterality Date  . ANTERIOR CERVICAL CORPECTOMY N/A 05/24/2017   Procedure: CORPECTOMY CERVICAL FIVE- CERVICAL SIX;  Surgeon: Ashok Pall, MD;  Location: [facility];  Service: Neurosurgery;  Laterality: N/A;  . ARTERIOVENOUS GRAFT PLACEMENT Right 1990's?  . ARTERIOVENOUS GRAFT PLACEMENT Left 07/2002   upper arm/notes 06/02/2010  . ARTERIOVENOUS GRAFT PLACEMENT Right 07/2003   upper  arm/notes 06/02/2010  . AV FISTULA PLACEMENT Left 09/2000   upper arm/notes 06/02/2010  . BREAST BIOPSY Left unsure   benign  . COLONOSCOPY    . DILATATION & CURRETTAGE/HYSTEROSCOPY WITH RESECTOCOPE N/A 04/17/2013   Procedure: DILATATION & CURETTAGE/HYSTEROSCOPY WITH RESECTOCOPE;  Surgeon: Marvene Staff, MD;  Location: [facility];  Service: Gynecology;  Laterality: N/A;  . DILATION AND CURETTAGE OF UTERUS    . EYE SURGERY    . KIDNEY TRANSPLANT  1997  . KIDNEY TRANSPLANT  01/21/2017   Dr. Harrington Challenger  . PARATHYROIDECTOMY  03/2000 05/12/2014   w/neck exploration & autotransplantation/notes 06/02/2010; w/neck exploration  . PARATHYROIDECTOMY N/A 05/12/2014   Procedure: PARATHYROIDECTOMY AND NECK EXPLORATION;  Surgeon: Armandina Gemma, MD;  Location: [facility];  Service: General;  Laterality: N/A;  . PERITONEAL CATHETER INSERTION    . PERITONEAL CATHETER REMOVAL  08/1999   [name] 06/02/2010  . POSTERIOR CERVICAL FUSION/FORAMINOTOMY N/A 05/30/2017   Procedure: POSTERIOR CERVICAL Arthrodesis Cervical three - four, cervical four - five, cervical five - six, cervical six - seven;  Surgeon: Ashok Pall, MD;  Location: [facility];  Service: Neurosurgery;  Laterality: N/A;  . POSTERIOR CERVICAL LAMINECTOMY N/A 08/12/2016   Procedure: POSTERIOR CERVICAL LAMINECTOMY CERVICAL THREE CERVICAL FOUR, CERVICAL FOUR CERVICAL FIVE, CERVICAL FIVE- CERVICAL SIX, CERVICAL SIX- CERVICAL SEVEN;  Surgeon: Ashok Pall, MD;  Location: [facility];  Service: Neurosurgery;  Laterality: N/A;  POSTERIOR  . RETINAL DETACHMENT SURGERY Left   . THROMBECTOMY Left 02/2002   fistula/notes 06/02/2010  . THROMBECTOMY / ARTERIOVENOUS GRAFT REVISION  11/2003; 01/2004; 08/07/2005; 08/10/2005; 10/2005   [name] 06/02/2010  . THROMBECTOMY AND REVISION OF ARTERIOVENTOUS (AV) GORETEX  GRAFT Right 01/2002; 11/2003; 01/2004; 08/07/2004; 08/10/2004; 11/13/2005   [name] 5/1/2012Marland Kitchen [name] 5/16/2012Marland Kitchen [name] 5/16/2012Marland Kitchen [name] 5/16/2012Marland Kitchen [name] 06/02/2010; [name] 06/02/2010  .  THROMBECTOMY AND REVISION OF ARTERIOVENTOUS (AV) GORETEX  GRAFT Left 08/23/2002; 09/13/2002; 07/08/2003   [name] 06/02/2010; [name] 06/02/2010; [name] 06/02/2010    Social history:  reports that she quit smoking about 2 years ago. Her smoking use included cigarettes. She has a 3.60 pack-year smoking history. She has never used smokeless tobacco. She reports that she does not drink alcohol and does not use drugs.   Allergies  Allergen Reactions  . Contrast Media [Iodinated Diagnostic Agents] Other (See Comments)    Cant take due to kidney transplant  . Nsaids Other (See Comments)    Cant take due to kidney transplant   . Penicillins Hives and Other (See Comments)    PATIENT HAS HAD A PCN REACTION WITH IMMEDIATE RASH, FACIAL/TONGUE/THROAT SWELLING, SOB, OR LIGHTHEADEDNESS WITH HYPOTENSION:  #  #  YES  #  #  Has patient had a PCN reaction causing severe rash involving mucus membranes or skin necrosis: No Has patient had a PCN reaction that required hospitalization No Has patient had a PCN reaction occurring within the last 10 years: No If all of the above answers are "NO", then may proceed with Cephalosporin  . Ace Inhibitors Hives and Nausea And Vomiting  . Codeine Hives    Family History  Adopted: Yes  Problem Relation Age of Onset  . Diabetes Maternal Aunt        x2  . Diabetes Maternal Uncle    Mother had a stroke.  Maternal uncle with BPH.   Prior to Admission medications   Medication Sig Start Date End Date Taking? Authorizing Provider  acetaminophen (TYLENOL) 500 MG tablet Take 1,000 mg by mouth every 6 (six) hours as needed for moderate pain or headache.    Yes [provider]  ALPRAZolam (XANAX) 0.25 MG tablet Take 1 tablet (0.25 mg total) by mouth daily as needed for anxiety. 12/11/18  Yes Hoyt Koch, MD  aspirin 81 MG chewable tablet Chew 81 mg by mouth daily.   Yes [provider]  b complex-vitamin c-folic acid (NEPHRO-VITE) 0.8 MG TABS tablet  Take 1 tablet by mouth daily.   Yes [provider]  gabapentin (NEURONTIN) 300 MG capsule Take 2 capsules (600 mg total) by mouth 2 (two) times daily as needed (nerve pain). 12/11/18  Yes Hoyt Koch, MD  mycophenolate (MYFORTIC) 180 MG EC tablet Take 720 mg by mouth 2 (two) times daily.    Yes [provider]  polyethylene glycol (MIRALAX / GLYCOLAX) packet Take 17 g by mouth daily. Patient taking differently: Take 17 g by mouth daily as needed for mild constipation.  10/01/17  Yes Thurnell Lose, MD  predniSONE (DELTASONE) 2.5 MG tablet Take 2.5 mg by mouth every morning. Take with 5mg  qd to =7.5mg  qd total   Yes [provider]  predniSONE (DELTASONE) 5 MG tablet Take 5 mg by mouth daily. Take with 2.5mg  to = 7.5mg  qd total 07/23/19  Yes [provider]  PROGRAF 0.5 MG capsule Take 0.5 mg by mouth 2 (two) times daily. 07/23/19  Yes [provider]  rosuvastatin (CRESTOR) 5 MG tablet Take 5 mg by mouth daily. 04/24/19  Yes [provider]  sodium bicarbonate 650 MG tablet Take 650 mg by mouth daily.    Yes [provider]  sulfamethoxazole-trimethoprim (BACTRIM,SEPTRA) 400-80 MG tablet Take 1 tablet by mouth every Monday, Wednesday, and Friday.   Yes [provider]  tacrolimus (PROGRAF) 1 MG capsule Take 2 mg by mouth in the morning and at bedtime.    Yes [provider]    Physical Exam: Vitals:   08/06/19 1145 08/06/19 1200 08/06/19 1245 08/06/19 1300  BP: (!) 157/83 (!) 148/60 (!) 156/97 (!) 169/84  Pulse: 88 86 82 85  Resp:  14 18 13   Temp:      TempSrc:      SpO2: 100% 97% 100% 98%    Constitutional: Moderately built female, in mild distress due to abdominal distention and NG tube.   Eyes: PERRL, lids and conjunctivae normal ENMT: Mucous membranes are dry, NG tube in place and to suction.   Neck: supple at this time, was able to bring her chin to the chest without any pain, no masses, no  thyromegaly Respiratory: clear to auscultation bilaterally, no wheezing, no crackles. Normal respiratory effort. No accessory muscle use.  Cardiovascular: Regular rate and rhythm, + systolic murmur at apex, no extremity edema. 2+ pedal pulses. No carotid bruits.  Abdomen: Moderately distended, tympanic, mild diffuse tenderness to deep palpation but no rebound or guarding.  Reduced bowel sounds Musculoskeletal: no clubbing / cyanosis. No joint deformity upper and lower extremities. Good ROM, no contractures. Normal muscle tone.  Neurologic: Somnolent, CN 2-12 grossly intact. Sensation intact, DTR normal. Strength 5/5 in all 4.  Psychiatric: Normal judgment and insight although somnolent after receiving IV pain medications.  Normal mood.  SKIN/catheters: no rashes, lesions, ulcers. No induration  Labs on Admission: I have personally reviewed  following labs and imaging studies  CBC: Recent Labs  Lab 08/06/19 0216  WBC 7.4  HGB 13.5  HCT 43.7  MCV 98.0  PLT 426   Basic Metabolic Panel: Recent Labs  Lab 08/06/19 0216  NA 137  K 4.2  CL 104  CO2 22  GLUCOSE 106*  BUN 18  CREATININE 1.64*  CALCIUM 10.4*   GFR: CrCl cannot be calculated (Unknown ideal weight.). Recent Labs  Lab 08/06/19 0216  WBC 7.4   Liver Function Tests: Recent Labs  Lab 08/06/19 0216  AST 22  ALT 10  ALKPHOS 44  BILITOT 0.8  PROT 7.6  ALBUMIN 4.5   Recent Labs  Lab 08/06/19 0216  LIPASE 32   No results for input(s): AMMONIA in the last 168 hours. Coagulation Profile: No results for input(s): INR, PROTIME in the last 168 hours. Cardiac Enzymes: No results for input(s): CKTOTAL, CKMB, CKMBINDEX, TROPONINI in the last 168 hours. BNP (last 3 results) No results for input(s): PROBNP in the last 8760 hours. HbA1C: No results for input(s): HGBA1C in the last 72 hours. CBG: No results for input(s): GLUCAP in the last 168 hours. Lipid Profile: No results for input(s): CHOL, HDL, LDLCALC, TRIG,  CHOLHDL, LDLDIRECT in the last 72 hours. Thyroid Function Tests: No results for input(s): TSH, T4TOTAL, FREET4, T3FREE, THYROIDAB in the last 72 hours. Anemia Panel: No results for input(s): VITAMINB12, FOLATE, FERRITIN, TIBC, IRON, RETICCTPCT in the last 72 hours. Urine analysis:    Component Value Date/Time   COLORURINE YELLOW 09/30/2017 1456   APPEARANCEUR CLEAR 09/30/2017 1456   LABSPEC 1.008 09/30/2017 1456   PHURINE 6.0 09/30/2017 1456   GLUCOSEU NEGATIVE 09/30/2017 1456   HGBUR SMALL (A) 09/30/2017 1456   BILIRUBINUR NEGATIVE 09/30/2017 1456   KETONESUR NEGATIVE 09/30/2017 1456   PROTEINUR NEGATIVE 09/30/2017 1456   NITRITE NEGATIVE 09/30/2017 1456   LEUKOCYTESUR NEGATIVE 09/30/2017 1456    Radiological Exams on Admission: Personally reviewed  CT ABDOMEN PELVIS WO CONTRAST  Result Date: 08/06/2019 CLINICAL DATA:  Abdominal pain all over with nausea vomiting for 2 days, history pancreatitis EXAM: CT ABDOMEN AND PELVIS WITHOUT CONTRAST TECHNIQUE: Multidetector CT imaging of the abdomen and pelvis was performed following the standard protocol without IV contrast. IV contrast not administered due to abnormal renal function. No oral contrast administered. Sagittal and coronal MPR images reconstructed from axial data set. COMPARISON:  09/29/2017 FINDINGS: Lower chest: Mitral annular calcification. Visualized lung bases clear Hepatobiliary: Gallbladder and liver unremarkable Pancreas: Normal appearance without mass or surrounding inflammatory changes. No calcifications or definite ductal dilatation. Spleen: Capsular calcification at spleen question sequela of prior hemorrhage or trauma. No focal mass. Adrenals/Urinary Tract: Unremarkable adrenal glands. Atrophic kidneys consistent with end-stage renal disease. Tiny nonobstructing calculus at inferior pole LEFT kidney. Complicated cyst arising from lateral aspect mid to upper LEFT kidney 16 x 12 mm, stable size though some demonstrating  slightly greater calcification. Transplant kidney RIGHT iliac fossa without mass or hydronephrosis. Bladder unremarkable. Stomach/Bowel: Stomach normal appearance. Scattered colonic diverticulosis without evidence of diverticulitis. Colon otherwise unremarkable. Appendix not visualized but no pericecal inflammatory process seen. Terminal ileum decompressed. Remaining small bowel diffusely dilated consistent with distal small bowel obstruction. Transition zone is in the RIGHT upper pelvis approximately image 58. No bowel wall thickening. Vascular/Lymphatic: Extensive atherosclerotic calcifications aorta, iliac arteries, coronary arteries. Aorta normal caliber. Extensive calcifications along the LEFT external iliac vessels question related to prior surgery. No adenopathy. Reproductive: Unremarkable uterus.  No adnexal masses. Other: No free air  or free fluid.  No hernia. Musculoskeletal: Superior endplate deformities of T11 and T12 unchanged. IMPRESSION: Distal small bowel obstruction with transition zone in the RIGHT upper pelvis, favor adhesion. Colonic diverticulosis without evidence of diverticulitis. Transplant kidney RIGHT iliac fossa without hydronephrosis. Atrophic kidneys consistent with end-stage renal disease. Extensive atherosclerotic calcifications. Aortic Atherosclerosis (ICD10-I70.0). Electronically Signed   By: Lavonia Dana M.D.   On: 08/06/2019 10:36   DG Lumbar Spine Complete  Result Date: 08/06/2019 CLINICAL DATA:  Fall. Additional provided: Sharp pain lower back (right greater than left). EXAM: LUMBAR SPINE - COMPLETE 4+ VIEW COMPARISON:  CT abdomen/pelvis 09/29/2017. FINDINGS: Five lumbar vertebrae in correlating with prior CT 09/29/2017. No significant spondylolisthesis. Redemonstrated mild chronic T11 and T12 vertebral body height loss with unchanged superior endplate irregularity at these levels. No lumbar compression deformity. Mild multilevel disc space narrowing, greatest at L5-S1. Mild  lower lumbar facet arthrosis. Aortic atherosclerosis. IMPRESSION: No lumbar compression deformity is demonstrated. Unchanged mild chronic T11 and T12 vertebral body height loss. Mild lumbar spondylosis as outlined, greatest within the lower lumbar spine. Electronically Signed   By: Kellie Simmering DO   On: 08/06/2019 10:01   CT Cervical Spine Wo Contrast  Result Date: 08/06/2019 CLINICAL DATA:  Acute neck pain EXAM: CT CERVICAL SPINE WITHOUT CONTRAST TECHNIQUE: Multidetector CT imaging of the cervical spine was performed without intravenous contrast. Multiplanar CT image reconstructions were also generated. COMPARISON:  None. FINDINGS: Alignment: Normal Skull base and vertebrae: There is anterior fusion from C4-C7 and posterior fusion from C3 to C7. There is abnormal lucency surrounding the transpedicular screws at C3. No other abnormal perihardware lucency. The spinal canal is posteriorly decompressed from C3 to C5. Soft tissues and spinal canal: No prevertebral fluid or swelling. No visible canal hematoma. Disc levels:  No bony spinal canal stenosis. Upper chest: Negative. Other: None. IMPRESSION: 1. No acute fracture or static subluxation of the cervical spine. 2. Anterior and posterior fusion from C3 to C7 with abnormal lucency surrounding the transpedicular screws at C3, consistent with loosening. Electronically Signed   By: Ulyses Jarred M.D.   On: 08/06/2019 03:38   DG Chest Portable 1 View  Result Date: 08/06/2019 CLINICAL DATA:  NG tube placement. History of abdominal pain and rectal bleeding, nausea and vomiting. EXAM: PORTABLE CHEST 1 VIEW COMPARISON:  September 29, 2017 FINDINGS: Film is rotated to the RIGHT. Postoperative changes are noted about the thoracic inlet and in the lower cervical spine and upper thoracic spine, incompletely imaged. Cardiomediastinal contours accounting for degree of rotation are unchanged. Subtle basilar opacities are demonstrated with elevation of LEFT hemidiaphragm as  compared to previous imaging, slightly elevated compared to the LEFT. No dense consolidation. Signs of stenting along the LEFT upper extremity tracking into the axilla with similar appearance. Gastric tube in place tip off the field of the radiograph. Tube courses below the LEFT hemidiaphragm. Dextroconvex curvature of the thoracic spine similar to the prior study. No acute osseous abnormality on limited assessment. IMPRESSION: 1. Subtle basilar opacities with elevation of LEFT hemidiaphragm as compared to previous imaging. May represent basilar atelectasis or developing infection. Correlate with any clinical risk for aspiration. Electronically Signed   By: Zetta Bills M.D.   On: 08/06/2019 11:52    EKG: Independently reviewed.  Normal sinus rhythm with QTC 396 ms     Assessment and Plan:   Principal Problem:   SBO (small bowel obstruction) (HCC) Active Problems:   HTN (hypertension), benign   Hypothyroidism   Stenosis  of cervical spine with myelopathy ([place])   Lower GI bleed   Diverticulosis   Kidney transplant recipient   Near syncope    1.  Small bowel obstruction: Per radiology report of CT scan, adhesions suspected to be causing distal bowel obstruction.  She reports history of GI bleed due to diverticulosis but denies any history of diverticulitis.  Patient denies any abdominal surgeries other than for renal transplants.  Feels relieved currently after NG tube placement.  Abdomen still mild to moderately distended.  Continue NGT, n.p.o. with IV fluids.  Await general surgery evaluation and further recommendations.  Unclear if severe vomiting preceded bowel obstruction as patient did have diarrhea yesterday.  She however reports abdominal distention since last night and no BM since then.  Cautious with IV pain medications given current hypersomnolence and CKD.  2.  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  Patient had been hemodialysis dependent for 21 years.  Now S/p successful renal transplant since 2019, follows [facility] and on maintenance immunosuppression with Tacrolimus, CellCept, and steroid maintenance.  Also noted to be on Bactrim prophylaxis.  Patient's baseline creatinine appears to be around 1.2-1.3 (1.3 in Feb 2021 at [facility]).  Currently with volume losses due to N/V/D and now n.p.o. due to bowel obstruction.  Will monitor with IV hydration.  May need to resume immunosuppressive medications as soon as possible.  We will consult renal to assist with comanagement.  May need stress dose steroids.  3.  Heart murmur: Not a new finding and per patient has had for many years.  Also noted in problem list.  4.  Near syncope: Likely vagal in the setting of nausea, vomiting and diarrhea.  Happened when she was getting up from the commode.  Denies any loss of consciousness.  5.  Neck pain: Now resolved.  CT neck findings as discussed above.  No acute fracture or subluxation but lucency reported indicating loosening of screws of prior fusion surgery for cervical stenosis with myelopathy..  Follows Dr. Christella Noa as outpatient.  6. Hypertension: Not on any medications per home med list.  Will add Norvasc when able to take p.o, if blood pressure still elevated.  IV medications for now.  7.  Hyperlipidemia: Resume home medications   8. Anxiety: Patient on Xanax and Neurontin at home.  Low-dose IV Ativan as needed.  Patient however appears somnolent currently after receiving high-dose IV fentanyl.   DVT prophylaxis: Heparin  COVID screen: Pending results  Code Status: Full code   .Health care proxy would be her daughter Larene Beach who lives in [place]  Patient/Family Communication: Discussed with patient and all questions answered to satisfaction.  Consults called: Nephrology on-call (Dr. Candiss Norse) paged Admission status :I certify that at the point of admission it is my  clinical judgment that the patient will require inpatient hospital care spanning beyond 2 midnights from the point of admission due to high intensity of service and high frequency of surveillance required.Inpatient status is judged to be reasonable and necessary in order to provide the required intensity of service to ensure the patient's safety. The patient's presenting symptoms, physical exam findings, and initial radiographic and laboratory data in the context of their chronic comorbidities is felt to place them at high risk for further clinical deterioration. The following factors support the patient status of inpatient : Small bowel obstruction requiring n.p.o. status, and IV fluids/surgical evaluation  Guilford Shi MD [facility] Pager in [place]  If 7PM-7AM, please contact night-coverage [email or web address]   08/06/2019, 1:12 PM

## 2019-08-06 NOTE — ED Notes (Signed)
Pt dry heaving in triage.

## 2019-08-06 NOTE — ED Notes (Signed)
Pt transported to CT ?

## 2019-08-06 NOTE — ED Notes (Signed)
C-Collar placed on pt per RN.

## 2019-08-06 NOTE — ED Notes (Signed)
Pt refusing contrast , Attending made aware.

## 2019-08-06 NOTE — ED Notes (Signed)
After speaking to attending MD- pt now will take Gastrografin. Gastrografin administered per MD order- NG tube clamped- xray notified of administration time.

## 2019-08-06 NOTE — Consult Note (Signed)
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  She also reported bloating.  She did have an episode of dizziness when she was taking a shower and felt like she was going to fall when she did not.  She related this to her episodes of pancreatitis in the past.  She otherwise denies any fevers, headaches, changes in vision, tremors, chest pain, shortness of breath, pain over her transplant site, hematuria, dysuria. Found to have a small bowel obstruction here therefore NG tube was placed.  Although significantly better as compared to when she came in she still having some nausea and abdominal pain.   PMH Incudes: Past Medical History:  Diagnosis Date   Acute blood loss anemia    Anemia    Anxiety    Arthritis    Claustrophobia    Diverticulosis    ESRD (end stage renal disease) on dialysis ([place])    "TTS; [place]" (03/24/2015)   GI bleed    Glomerulonephritis    Heart murmur    HTN (hypertension)    Hypothyroidism    Kidney transplant recipient 01/21/2017   Pancreatitis    Renal insufficiency    Secondary hyperparathyroidism ([place])    [name] 05/11/2014      Creatinine, Ser (mg/dL)  Date Value  08/06/2019 1.64 (H)  10/01/2017 1.26 (H)  09/30/2017 1.25 (H)  09/29/2017 1.22 (H)  08/23/2017 1.24 (H)  08/22/2017 1.21 (H)  08/21/2017 1.23 (H)  08/20/2017 1.20 (H)  08/20/2017 1.27 (H)  05/30/2017  0.93  ] I/Os:  ROS Balance of 12 systems is negative w/ exceptions as above  PMH  Past Medical History:  Diagnosis Date   Acute blood loss anemia    Anemia    Anxiety    Arthritis    Claustrophobia    Diverticulosis    ESRD (end stage renal disease) on dialysis ([place])    "TTS; [place]" (03/24/2015)   GI bleed    Glomerulonephritis    Heart murmur    HTN (hypertension)    Hypothyroidism    Kidney transplant recipient 01/21/2017   Pancreatitis    Renal insufficiency    Secondary hyperparathyroidism ([place])    [name] 05/11/2014   PSH  Past Surgical History:  Procedure Laterality Date   ANTERIOR CERVICAL CORPECTOMY N/A 05/24/2017   Procedure: CORPECTOMY CERVICAL FIVE- CERVICAL SIX;  Surgeon: Ashok Pall, MD;  Location: [place];  Service: Neurosurgery;  Laterality: N/A;   ARTERIOVENOUS GRAFT PLACEMENT Right 1990's?   ARTERIOVENOUS GRAFT PLACEMENT Left 07/2002   upper arm/notes 06/02/2010   ARTERIOVENOUS GRAFT PLACEMENT Right 07/2003   upper arm/notes 06/02/2010   AV FISTULA PLACEMENT Left 09/2000   upper arm/notes 06/02/2010   BREAST BIOPSY Left unsure   benign   COLONOSCOPY     DILATATION & CURRETTAGE/HYSTEROSCOPY WITH RESECTOCOPE N/A 04/17/2013   Procedure: DILATATION & CURETTAGE/HYSTEROSCOPY WITH RESECTOCOPE;  Surgeon: Marvene Staff, MD;  Location: [facility];  Service: Gynecology;  Laterality: N/A;   DILATION AND CURETTAGE  OF UTERUS     EYE SURGERY     KIDNEY TRANSPLANT  1997   KIDNEY TRANSPLANT  01/21/2017   Dr. Harrington Challenger   PARATHYROIDECTOMY  03/2000 05/12/2014   w/neck exploration & autotransplantation/notes 06/02/2010; w/neck exploration   PARATHYROIDECTOMY N/A 05/12/2014   Procedure: PARATHYROIDECTOMY AND NECK EXPLORATION;  Surgeon: Armandina Gemma, MD;  Location: [place];  Service: General;  Laterality: N/A;   PERITONEAL CATHETER INSERTION     PERITONEAL CATHETER REMOVAL  08/1999   [name] 06/02/2010   POSTERIOR CERVICAL FUSION/FORAMINOTOMY N/A 05/30/2017    Procedure: POSTERIOR CERVICAL Arthrodesis Cervical three - four, cervical four - five, cervical five - six, cervical six - seven;  Surgeon: Ashok Pall, MD;  Location: [place];  Service: Neurosurgery;  Laterality: N/A;   POSTERIOR CERVICAL LAMINECTOMY N/A 08/12/2016   Procedure: POSTERIOR CERVICAL LAMINECTOMY CERVICAL THREE CERVICAL FOUR, CERVICAL FOUR CERVICAL FIVE, CERVICAL FIVE- CERVICAL SIX, CERVICAL SIX- CERVICAL SEVEN;  Surgeon: Ashok Pall, MD;  Location: [place];  Service: Neurosurgery;  Laterality: N/A;  POSTERIOR   RETINAL DETACHMENT SURGERY Left    THROMBECTOMY Left 02/2002   fistula/notes 06/02/2010   THROMBECTOMY / ARTERIOVENOUS GRAFT REVISION  11/2003; 01/2004; 08/07/2005; 08/10/2005; 10/2005   [name] 06/02/2010   THROMBECTOMY AND REVISION OF ARTERIOVENTOUS (AV) GORETEX  GRAFT Right 01/2002; 11/2003; 01/2004; 08/07/2004; 08/10/2004; 11/13/2005   [name] 5/1/2012Marland Kitchen [name] 5/16/2012Marland Kitchen [name] 5/16/2012Marland Kitchen [name] 06/02/2010; [name] 06/02/2010; [name] 06/02/2010   THROMBECTOMY AND REVISION OF ARTERIOVENTOUS (AV) GORETEX  GRAFT Left 08/23/2002; 09/13/2002; 07/08/2003   [name] 06/02/2010; [name] 06/02/2010; [name] 06/02/2010   FH  Family History  Adopted: Yes  Problem Relation Age of Onset   Diabetes Maternal Aunt        x2   Diabetes Maternal Uncle    SH  reports that she quit smoking about 2 years ago. Her smoking use included cigarettes. She has a 3.60 pack-year smoking history. She has never used smokeless tobacco. She reports that she does not drink alcohol and does not use drugs. Allergies  Allergies  Allergen Reactions   Contrast Media [Iodinated Diagnostic Agents] Other (See Comments)    Cant take due to kidney transplant   Nsaids Other (See Comments)    Cant take due to kidney transplant    Penicillins Hives and Other (See Comments)    PATIENT HAS HAD A PCN REACTION WITH IMMEDIATE RASH, FACIAL/TONGUE/THROAT SWELLING, SOB, OR LIGHTHEADEDNESS WITH HYPOTENSION:  #  #  YES  #  #  Has patient had a  PCN reaction causing severe rash involving mucus membranes or skin necrosis: No Has patient had a PCN reaction that required hospitalization No Has patient had a PCN reaction occurring within the last 10 years: No If all of the above answers are "NO", then may proceed with Cephalosporin   Ace Inhibitors Hives and Nausea And Vomiting   Codeine Hives   Home medications Prior to Admission medications   Medication Sig Start Date End Date Taking? Authorizing Provider  acetaminophen (TYLENOL) 500 MG tablet Take 1,000 mg by mouth every 6 (six) hours as needed for moderate pain or headache.    Yes [provider]  ALPRAZolam (XANAX) 0.25 MG tablet Take 1 tablet (0.25 mg total) by mouth daily as needed for anxiety. 12/11/18  Yes Hoyt Koch, MD  aspirin 81 MG chewable tablet Chew 81 mg by mouth daily.   Yes [provider]  b complex-vitamin c-folic acid (NEPHRO-VITE) 0.8 MG TABS tablet Take 1 tablet by mouth daily.   Yes [provider]  gabapentin (NEURONTIN) 300 MG capsule Take 2 capsules (600 mg total) by mouth 2 (two) times daily as needed (nerve pain). 12/11/18  Yes Hoyt Koch, MD  mycophenolate (MYFORTIC) 180 MG EC tablet Take 720 mg by mouth 2 (two) times daily.    Yes [provider]  polyethylene glycol (MIRALAX / GLYCOLAX) packet Take 17 g by mouth daily. Patient taking differently: Take 17 g by mouth daily as needed for mild constipation.  10/01/17  Yes Thurnell Lose, MD  predniSONE (DELTASONE) 2.5 MG tablet Take 2.5 mg by mouth every morning. Take with 10m qd to =7.514mqd total   Yes [provider]  predniSONE (DELTASONE) 5 MG tablet Take 5 mg by mouth daily. Take with 2.54m31mo = 7.54mg52m total 07/23/19  Yes [provider]  PROGRAF 0.5 MG capsule Take 0.5 mg by mouth 2 (two) times daily. 07/23/19  Yes [provider]  rosuvastatin (CRESTOR) 5 MG tablet Take 5 mg by mouth daily. 04/24/19  Yes [provider]  sodium bicarbonate 650 MG tablet Take 650 mg by mouth daily.    Yes [provider]  sulfamethoxazole-trimethoprim (BACTRIM,SEPTRA) 400-80 MG tablet Take 1 tablet by mouth every Monday, Wednesday, and Friday.   Yes [provider]  tacrolimus (PROGRAF) 1 MG capsule Take 2 mg by mouth in the morning and at bedtime.    Yes [provider]    Current Medications Scheduled Meds:  diatrizoate meglumine-sodium  90 mL Per NG tube Once   heparin  5,000 Units Subcutaneous Q8H   hydrocortisone sod succinate (SOLU-CORTEF) inj  50 mg Intravenous Q8H   Continuous Infusions:  sodium chloride 100 mL/hr at 08/06/19 1511   PRN Meds:.acetaminophen **OR** acetaminophen, albuterol, fentaNYL (SUBLIMAZE) injection, hydrALAZINE, LORazepam, ondansetron **OR** ondansetron (ZOFRAN) IV  CBC Recent Labs  Lab 08/06/19 0216  WBC 7.4  HGB 13.5  HCT 43.7  MCV 98.0  PLT 249 035asic Metabolic Panel Recent Labs  Lab 08/06/19 0216  NA 137  K 4.2  CL 104  CO2 22  GLUCOSE 106*  BUN 18  CREATININE 1.64*  CALCIUM 10.4*    Physical Exam  Blood pressure (!) 160/77, pulse 91, temperature 98.5 F (36.9 C), temperature source Oral, resp. rate 15, SpO2 98 %. GEN: nad, sitting up in bed, comfortable ENT: NG tube in place, mmm EYES: no scleral icterus, eomi CV: s1s2, rrr, no m/r/g PULM: Clear to auscultation bilaterally, unlabored, no wheezes/Rales/rhonchi/crackles, speaking full sentences, bilateral chest expansion ABD: Soft, slight tenderness but no tenderness over transplant site, slightly distended SKIN: no rashes or jaundice EXT: no edema, warm and well perfused Neuro: aaox3, speech clear and coherent, moves all extremities spontaneously   Assessment 1. AKI in the setting of renal transplant x 2 ('92 and 01/21/2017).  Baseline creatinine around 1.2-1.3 and now 1.6 which is likely related to prerenal injury in the setting of nausea/vomiting. 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 Induction: [place]  -home maintenance immunosuppression: tacrolimus 2.54mg 254m,  prednisone 7.54mg, 12mly (high immunologic risk), and myfortic  720mg b66m.  Nausea/vomiting/abdominal pain secondary to small bowel obstruction.  Currently n.p.o. with NG  tube 3.  History of hypertension (questionable if she is on any home meds at all).  Monitoring for now utilize IV antihypertensives especially as she is NPO.   Plan Okay for oral contrast but would avoid IV contrast at this time Agree with maintenance IV fluids Unfortunately tacrolimus sublingual not available in our system. Will utilize oral suspension as tolerated (not a significant amount of mL per dose). If not able to tolerate it then will either hold off until she is able to take PO or utilize IV (however that is a 24-hour infusion and would not favor that immediately). Monitor 12-hour tacrolimus troughs (30 minutes before next dose) daily.  We will adjust tacrolimus as needed and will be targeting a goal trough of 7-9.  Agree with changing prednisone to Solu-cortef for now Continue with myfortic (765m BID at home), ordered IV conversion to cellcept IV 10045mbid Check CMV titers Check BK viral load (blood) Check ultrasound of renal transplant with Doppler Avoid nephrotoxic medications including NSAIDs and iodinated intravenous contrast exposure unless the latter is absolutely indicated.  Preferred narcotic agents for pain control are hydromorphone, fentanyl, and methadone. Morphine should not be used. Avoid Baclofen and avoid oral sodium phosphate and magnesium citrate  based laxatives / bowel preps. Continue strict Input and Output monitoring. Will monitor the patient closely with you and intervene or adjust therapy as indicated by changes in clinical status/labs  Discussed with inpatient pharmacy with above immunosuppression plan  Immunosuppression Management: High Risk Medical Decision Making For Drug Therapy Requiring Intensive Monitoring For Toxicity Will continue intensive monitoring for drug levels and toxicity via regularly scheduled laboratory assays given the narrow therapeutic index of the immunosuppressive drug therapy listed above  ViGean QuintMD [facility] 08/06/2019, 4:11 PM

## 2019-08-06 NOTE — ED Provider Notes (Signed)
[facility] EMERGENCY DEPARTMENT Provider Note   CSN: [id number] Arrival date & time: 08/06/19  0155     History Chief Complaint  Patient presents with  . GI Bleeding    [name] is a 59 y.o. female.  HPI   Pt had vomiting and diarrhea yesterday.  Pt had mutliple episodes of both yesterday.  She also started having pain in her upper abdomen like when she had pancreatitis before.  She got dizzy when she went to get up from the commode last night and had a fall.  She felt her neck pop and is now having pain in her neck as well as still having pain in her abdomen.   She noticed some blood in her stool but not like black stool.  Past Medical History:  Diagnosis Date  . Acute blood loss anemia   . Anemia   . Anxiety   . Arthritis   . Claustrophobia   . Diverticulosis   . ESRD (end stage renal disease) on dialysis ([place])    "TTS; [facility]" (03/24/2015)  . GI bleed   . Glomerulonephritis   . Heart murmur   . HTN (hypertension)   . Hypothyroidism   . Kidney transplant recipient 01/21/2017  . Pancreatitis   . Renal insufficiency   . Secondary hyperparathyroidism ([place])    [name] 05/11/2014    Patient Active Problem List   Diagnosis Date Noted  . History of GI diverticular bleed 09/11/2017  . Lower GI bleed   . Acute blood loss anemia   . Diverticulosis   . 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 Kidney transplant recipient 01/21/2017  . Anxiety 11/21/2016  . Stenosis of cervical spine with myelopathy ([place]) 08/12/2016  . Routine general medical examination at a health care facility 06/01/2016  . Hypothyroidism 03/24/2015  . History of tachycardia 03/24/2015  . HTN (hypertension), benign 06/12/2013  . ESRD on dialysis [facility]) 06/12/2013    Past Surgical History:  Procedure Laterality Date  . ANTERIOR CERVICAL CORPECTOMY N/A 05/24/2017   Procedure: CORPECTOMY CERVICAL FIVE- CERVICAL SIX;  Surgeon: Ashok Pall, MD;  Location: [facility];   Service: Neurosurgery;  Laterality: N/A;  . ARTERIOVENOUS GRAFT PLACEMENT Right 1990's?  . ARTERIOVENOUS GRAFT PLACEMENT Left 07/2002   upper arm/notes 06/02/2010  . ARTERIOVENOUS GRAFT PLACEMENT Right 07/2003   upper arm/notes 06/02/2010  . AV FISTULA PLACEMENT Left 09/2000   upper arm/notes 06/02/2010  . BREAST BIOPSY Left unsure   benign  . COLONOSCOPY    . DILATATION & CURRETTAGE/HYSTEROSCOPY WITH RESECTOCOPE N/A 04/17/2013   Procedure: DILATATION & CURETTAGE/HYSTEROSCOPY WITH RESECTOCOPE;  Surgeon: Marvene Staff, MD;  Location: [facility];  Service: Gynecology;  Laterality: N/A;  . DILATION AND CURETTAGE OF UTERUS    . EYE SURGERY    . KIDNEY TRANSPLANT  1997  . KIDNEY TRANSPLANT  01/21/2017   Dr. Harrington Challenger  . PARATHYROIDECTOMY  03/2000 05/12/2014   w/neck exploration & autotransplantation/notes 06/02/2010; w/neck exploration  . PARATHYROIDECTOMY N/A 05/12/2014   Procedure: PARATHYROIDECTOMY AND NECK EXPLORATION;  Surgeon: Armandina Gemma, MD;  Location: [facility];  Service: General;  Laterality: N/A;  . PERITONEAL CATHETER INSERTION    . PERITONEAL CATHETER REMOVAL  08/1999   [name] 06/02/2010  . POSTERIOR CERVICAL FUSION/FORAMINOTOMY N/A 05/30/2017   Procedure: POSTERIOR CERVICAL Arthrodesis Cervical three - four, cervical four - five, cervical five - six, cervical six - seven;  Surgeon: Ashok Pall, MD;  Location: [facility];  Service: Neurosurgery;  Laterality: N/A;  .  POSTERIOR CERVICAL LAMINECTOMY N/A 08/12/2016   Procedure: POSTERIOR CERVICAL LAMINECTOMY CERVICAL THREE CERVICAL FOUR, CERVICAL FOUR CERVICAL FIVE, CERVICAL FIVE- CERVICAL SIX, CERVICAL SIX- CERVICAL SEVEN;  Surgeon: Ashok Pall, MD;  Location: [facility];  Service: Neurosurgery;  Laterality: N/A;  POSTERIOR  . RETINAL DETACHMENT SURGERY Left   . THROMBECTOMY Left 02/2002   fistula/notes 06/02/2010  . THROMBECTOMY / ARTERIOVENOUS GRAFT REVISION  11/2003; 01/2004; 08/07/2005; 08/10/2005; 10/2005   [name] 06/02/2010  . THROMBECTOMY AND REVISION OF  ARTERIOVENTOUS (AV) GORETEX  GRAFT Right 01/2002; 11/2003; 01/2004; 08/07/2004; 08/10/2004; 11/13/2005   [name] 5/1/2012Marland Kitchen [name] 5/16/2012Marland Kitchen [name] 5/16/2012Marland Kitchen [name] 5/16/2012Marland Kitchen [name] 06/02/2010; [name] 06/02/2010  . THROMBECTOMY AND REVISION OF ARTERIOVENTOUS (AV) GORETEX  GRAFT Left 08/23/2002; 09/13/2002; 07/08/2003   [name] 5/16/2012Marland Kitchen [name] 06/02/2010; [name] 06/02/2010     OB History   No obstetric history on file.     Family History  Adopted: Yes  Problem Relation Age of Onset  . Diabetes Maternal Aunt        x2  . Diabetes Maternal Uncle     Social History   Tobacco Use  . Smoking status: Former Smoker    Packs/day: 0.12    Years: 30.00    Pack years: 3.60    Types: Cigarettes    Quit date: 01/21/2017    Years since quitting: 2.5  . Smokeless tobacco: Never Used  Vaping Use  . Vaping Use: Never used  Substance Use Topics  . Alcohol use: No  . Drug use: No    Home Medications Prior to Admission medications   Medication Sig Start Date End Date Taking? Authorizing Provider  acetaminophen (TYLENOL) 500 MG tablet Take 1,000 mg by mouth every 6 (six) hours as needed for moderate pain or headache.    Yes [provider]  ALPRAZolam (XANAX) 0.25 MG tablet Take 1 tablet (0.25 mg total) by mouth daily as needed for anxiety. 12/11/18  Yes Hoyt Koch, MD  aspirin 81 MG chewable tablet Chew 81 mg by mouth daily.   Yes [provider]  b complex-vitamin c-folic acid (NEPHRO-VITE) 0.8 MG TABS tablet Take 1 tablet by mouth daily.   Yes [provider]  gabapentin (NEURONTIN) 300 MG capsule Take 2 capsules (600 mg total) by mouth 2 (two) times daily as needed (nerve pain). 12/11/18  Yes Hoyt Koch, MD  mycophenolate (MYFORTIC) 180 MG EC tablet Take 720 mg by mouth 2 (two) times daily.    Yes [provider]  polyethylene glycol (MIRALAX / GLYCOLAX) packet Take 17 g by mouth daily. Patient taking differently: Take 17 g by mouth daily as  needed for mild constipation.  10/01/17  Yes Thurnell Lose, MD  predniSONE (DELTASONE) 2.5 MG tablet Take 2.5 mg by mouth every morning. Take with 5mg  qd to =7.5mg  qd total   Yes [provider]  predniSONE (DELTASONE) 5 MG tablet Take 5 mg by mouth daily. Take with 2.5mg  to = 7.5mg  qd total 07/23/19  Yes [provider]  PROGRAF 0.5 MG capsule Take 0.5 mg by mouth 2 (two) times daily. 07/23/19  Yes [provider]  rosuvastatin (CRESTOR) 5 MG tablet Take 5 mg by mouth daily. 04/24/19  Yes [provider]  sodium bicarbonate 650 MG tablet Take 650 mg by mouth daily.    Yes [provider]  sulfamethoxazole-trimethoprim (BACTRIM,SEPTRA) 400-80 MG tablet Take 1 tablet by mouth every Monday, Wednesday, and Friday.   Yes [provider]  tacrolimus (PROGRAF) 1 MG capsule Take  2 mg by mouth in the morning and at bedtime.    Yes [provider]    Allergies    Contrast media [iodinated diagnostic agents], Nsaids, Penicillins, Ace inhibitors, and Codeine  Review of Systems   Review of Systems  All other systems reviewed and are negative.   Physical Exam Updated Vital Signs BP (!) 141/70   Pulse 73   Temp 98.5 F (36.9 C) (Oral)   Resp 16   SpO2 98%   Physical Exam Vitals and nursing note reviewed.  Constitutional:      Appearance: She is well-developed. She is ill-appearing.  HENT:     Head: Normocephalic and atraumatic.     Right Ear: External ear normal.     Left Ear: External ear normal.  Eyes:     General: No scleral icterus.       Right eye: No discharge.        Left eye: No discharge.     Conjunctiva/sclera: Conjunctivae normal.  Neck:     Trachea: No tracheal deviation.  Cardiovascular:     Rate and Rhythm: Normal rate and regular rhythm.  Pulmonary:     Effort: Pulmonary effort is normal. No respiratory distress.     Breath sounds: Normal breath sounds. No stridor. No wheezing or rales.  Abdominal:      General: Bowel sounds are normal. There is no distension.     Palpations: Abdomen is soft.     Tenderness: There is generalized abdominal tenderness. There is guarding. There is no rebound.  Musculoskeletal:     Cervical back: Neck supple. Tenderness present. Decreased range of motion.     Thoracic back: Normal.     Lumbar back: Tenderness present.  Skin:    General: Skin is warm and dry.     Findings: No rash.  Neurological:     Mental Status: She is alert.     Cranial Nerves: No cranial nerve deficit (no facial droop, extraocular movements intact, no slurred speech).     Sensory: No sensory deficit.     Motor: No abnormal muscle tone or seizure activity.     Coordination: Coordination normal.     ED Results / Procedures / Treatments   Labs (all labs ordered are listed, but only abnormal results are displayed) Labs Reviewed  COMPREHENSIVE METABOLIC PANEL - Abnormal; Notable for the following components:      Result Value   Glucose, Bld 106 (*)    Creatinine, Ser 1.64 (*)    Calcium 10.4 (*)    GFR calc non Af Amer 34 (*)    GFR calc Af Amer 40 (*)    All other components within normal limits  I-STAT BETA HCG BLOOD, ED ([facility], [facility], [facility] ONLY) - Abnormal; Notable for the following components:   I-stat hCG, quantitative 5.6 (*)    All other components within normal limits  SARS CORONAVIRUS 2 BY RT PCR (HOSPITAL ORDER, [facility])  CBC  LIPASE, BLOOD  POC OCCULT BLOOD, ED  TYPE AND SCREEN    EKG None  Radiology CT ABDOMEN PELVIS WO CONTRAST  Result Date: 08/06/2019 CLINICAL DATA:  Abdominal pain all over with nausea vomiting for 2 days, history pancreatitis EXAM: CT ABDOMEN AND PELVIS WITHOUT CONTRAST TECHNIQUE: Multidetector CT imaging of the abdomen and pelvis was performed following the standard protocol without IV contrast. IV contrast not administered due to abnormal renal function. No oral contrast administered. Sagittal and coronal MPR images  reconstructed  from axial data set. COMPARISON:  09/29/2017 FINDINGS: Lower chest: Mitral annular calcification. Visualized lung bases clear Hepatobiliary: Gallbladder and liver unremarkable Pancreas: Normal appearance without mass or surrounding inflammatory changes. No calcifications or definite ductal dilatation. Spleen: Capsular calcification at spleen question sequela of prior hemorrhage or trauma. No focal mass. Adrenals/Urinary Tract: Unremarkable adrenal glands. Atrophic kidneys consistent with end-stage renal disease. Tiny nonobstructing calculus at inferior pole LEFT kidney. Complicated cyst arising from lateral aspect mid to upper LEFT kidney 16 x 12 mm, stable size though some demonstrating slightly greater calcification. Transplant kidney RIGHT iliac fossa without mass or hydronephrosis. Bladder unremarkable. Stomach/Bowel: Stomach normal appearance. Scattered colonic diverticulosis without evidence of diverticulitis. Colon otherwise unremarkable. Appendix not visualized but no pericecal inflammatory process seen. Terminal ileum decompressed. Remaining small bowel diffusely dilated consistent with distal small bowel obstruction. Transition zone is in the RIGHT upper pelvis approximately image 58. No bowel wall thickening. Vascular/Lymphatic: Extensive atherosclerotic calcifications aorta, iliac arteries, coronary arteries. Aorta normal caliber. Extensive calcifications along the LEFT external iliac vessels question related to prior surgery. No adenopathy. Reproductive: Unremarkable uterus.  No adnexal masses. Other: No free air or free fluid.  No hernia. Musculoskeletal: Superior endplate deformities of T11 and T12 unchanged. IMPRESSION: Distal small bowel obstruction with transition zone in the RIGHT upper pelvis, favor adhesion. Colonic diverticulosis without evidence of diverticulitis. Transplant kidney RIGHT iliac fossa without hydronephrosis. Atrophic kidneys consistent with end-stage renal disease.  Extensive atherosclerotic calcifications. Aortic Atherosclerosis (ICD10-I70.0). Electronically Signed   By: Lavonia Dana M.D.   On: 08/06/2019 10:36   DG Lumbar Spine Complete  Result Date: 08/06/2019 CLINICAL DATA:  Fall. Additional provided: Sharp pain lower back (right greater than left). EXAM: LUMBAR SPINE - COMPLETE 4+ VIEW COMPARISON:  CT abdomen/pelvis 09/29/2017. FINDINGS: Five lumbar vertebrae in correlating with prior CT 09/29/2017. No significant spondylolisthesis. Redemonstrated mild chronic T11 and T12 vertebral body height loss with unchanged superior endplate irregularity at these levels. No lumbar compression deformity. Mild multilevel disc space narrowing, greatest at L5-S1. Mild lower lumbar facet arthrosis. Aortic atherosclerosis. IMPRESSION: No lumbar compression deformity is demonstrated. Unchanged mild chronic T11 and T12 vertebral body height loss. Mild lumbar spondylosis as outlined, greatest within the lower lumbar spine. Electronically Signed   By: Kellie Simmering DO   On: 08/06/2019 10:01   CT Cervical Spine Wo Contrast  Result Date: 08/06/2019 CLINICAL DATA:  Acute neck pain EXAM: CT CERVICAL SPINE WITHOUT CONTRAST TECHNIQUE: Multidetector CT imaging of the cervical spine was performed without intravenous contrast. Multiplanar CT image reconstructions were also generated. COMPARISON:  None. FINDINGS: Alignment: Normal Skull base and vertebrae: There is anterior fusion from C4-C7 and posterior fusion from C3 to C7. There is abnormal lucency surrounding the transpedicular screws at C3. No other abnormal perihardware lucency. The spinal canal is posteriorly decompressed from C3 to C5. Soft tissues and spinal canal: No prevertebral fluid or swelling. No visible canal hematoma. Disc levels:  No bony spinal canal stenosis. Upper chest: Negative. Other: None. IMPRESSION: 1. No acute fracture or static subluxation of the cervical spine. 2. Anterior and posterior fusion from C3 to C7 with  abnormal lucency surrounding the transpedicular screws at C3, consistent with loosening. Electronically Signed   By: Ulyses Jarred M.D.   On: 08/06/2019 03:38    Procedures Procedures (including critical care time)  Medications Ordered in ED Medications  ondansetron (ZOFRAN) injection 4 mg (4 mg Intravenous Given 08/06/19 0239)  ondansetron (ZOFRAN) injection 4 mg (4 mg Intravenous Given 08/06/19 1007)  fentaNYL (SUBLIMAZE) injection 100 mcg (100 mcg Intravenous Given 08/06/19 1007)    ED Course  I have reviewed the triage vital signs and the nursing notes.  Pertinent labs & imaging results that were available during my care of the patient were reviewed by me and considered in my medical decision making (see chart for details).  Clinical Course as of Aug 06 1106  Tue Aug 06, 2019  0912 CT scan without acute fracture.  Possible loosening of the screws   [JK]  0913 Laboratory tests are unremarkable.   [JK]  0913 Lipase is normal.  With her persistent pain pancreatitis is still a concern.  We will proceed with CT scan imaging   [JK]  0913 With her renal transplant and elevated creatinine will avoid IV contrast   [JK]  0913 CT scan shows a small bowel obstruction.  NG tube ordered   [JK]    Clinical Course User Index [JK] Dorie Rank, MD   MDM Rules/Calculators/A&P                          Patient presented to the emergency room for evaluation of abdominal pain as well as neck pain after having a near syncopal episode.  Patient was concerned she might have pancreatitis again.  Her laboratory tests are otherwise reassuring.  Lipase was not elevated.  Because of her persistent pain CT scan was performed.  CT scan demonstrates a small bowel obstruction.  Likely related to her prior surgery and adhesions.  I have ordered an NG tube.  I will consult the medical service for admission regarding her small bowel obstruction.  Regarding the patient's neck injury CT scan did not show any signs of  fracture.  There was question of some loosening of one of the surgical screws.  This is not causing any acute issues.  I discussed this finding with the patient and recommended that she contact her spine surgeon to review the films routinely.  Final Clinical Impression(s) / ED Diagnoses Final diagnoses:  Small bowel obstruction ([place])      Dorie Rank, MD 08/06/19 1108

## 2019-08-07 ENCOUNTER — Inpatient Hospital Stay (HOSPITAL_COMMUNITY): Payer: Medicare Other

## 2019-08-07 ENCOUNTER — Other Ambulatory Visit: Payer: Self-pay

## 2019-08-07 ENCOUNTER — Encounter (HOSPITAL_COMMUNITY): Payer: Self-pay | Admitting: Internal Medicine

## 2019-08-07 DIAGNOSIS — K56609 Unspecified intestinal obstruction, unspecified as to partial versus complete obstruction: Secondary | ICD-10-CM

## 2019-08-07 DIAGNOSIS — I10 Essential (primary) hypertension: Secondary | ICD-10-CM

## 2019-08-07 DIAGNOSIS — E038 Other specified hypothyroidism: Secondary | ICD-10-CM

## 2019-08-07 DIAGNOSIS — K5790 Diverticulosis of intestine, part unspecified, without perforation or abscess without bleeding: Secondary | ICD-10-CM

## 2019-08-07 DIAGNOSIS — Z940 Kidney transplant status: Secondary | ICD-10-CM

## 2019-08-07 HISTORY — DX: Unspecified intestinal obstruction, unspecified as to partial versus complete obstruction: K56.609

## 2019-08-07 LAB — CBC
HCT: 45.7 % (ref 36.0–46.0)
Hemoglobin: 14.1 g/dL (ref 12.0–15.0)
MCH: 29.8 pg (ref 26.0–34.0)
MCHC: 30.9 g/dL (ref 30.0–36.0)
MCV: 96.6 fL (ref 80.0–100.0)
Platelets: 258 10*3/uL (ref 150–400)
RBC: 4.73 MIL/uL (ref 3.87–5.11)
RDW: 13.4 % (ref 11.5–15.5)
WBC: 8.3 10*3/uL (ref 4.0–10.5)
nRBC: 0 % (ref 0.0–0.2)

## 2019-08-07 LAB — BASIC METABOLIC PANEL
Anion gap: 11 (ref 5–15)
BUN: 19 mg/dL (ref 6–20)
CO2: 21 mmol/L — ABNORMAL LOW (ref 22–32)
Calcium: 10.1 mg/dL (ref 8.9–10.3)
Chloride: 104 mmol/L (ref 98–111)
Creatinine, Ser: 1.6 mg/dL — ABNORMAL HIGH (ref 0.44–1.00)
GFR calc Af Amer: 41 mL/min — ABNORMAL LOW (ref >60)
GFR calc non Af Amer: 35 mL/min — ABNORMAL LOW (ref >60)
Glucose, Bld: 115 mg/dL — ABNORMAL HIGH (ref 70–99)
Potassium: 5 mmol/L (ref 3.5–5.1)
Sodium: 136 mmol/L (ref 135–145)

## 2019-08-07 MED ORDER — HEPARIN SODIUM (PORCINE) 5000 UNIT/ML IJ SOLN: 5000.0000 [IU] | Freq: Three times a day (TID) | INTRAMUSCULAR | Status: DC

## 2019-08-07 MED ORDER — METOPROLOL TARTRATE 5 MG/5ML IV SOLN
5.0000 mg | Freq: Three times a day (TID) | INTRAVENOUS | Status: DC
  Administered 2019-08-07 (×2): 5 mg via INTRAVENOUS
  Filled 2019-08-07 (×2): qty 5

## 2019-08-07 MED ORDER — LORAZEPAM 2 MG/ML IJ SOLN: 0.5000 mg | Freq: Two times a day (BID) | INTRAMUSCULAR | 0 refills | Status: DC | PRN

## 2019-08-07 MED ORDER — HYDROCORTISONE NA SUCCINATE PF 100 MG IJ SOLR: 50.0000 mg | Freq: Three times a day (TID) | INTRAMUSCULAR | Status: DC

## 2019-08-07 MED ORDER — ONDANSETRON HCL 4 MG PO TABS: 4.0000 mg | ORAL_TABLET | Freq: Four times a day (QID) | ORAL | 0 refills | Status: DC | PRN

## 2019-08-07 MED ORDER — FENTANYL CITRATE (PF) 100 MCG/2ML IJ SOLN: 12.5000 ug | INTRAMUSCULAR | 0 refills | Status: DC | PRN

## 2019-08-07 MED ORDER — HYDRALAZINE HCL 20 MG/ML IJ SOLN: 10.0000 mg | INTRAMUSCULAR | Status: DC | PRN

## 2019-08-07 MED ORDER — ONDANSETRON HCL 4 MG/2ML IJ SOLN: 4.0000 mg | Freq: Four times a day (QID) | INTRAMUSCULAR | 0 refills | Status: DC | PRN

## 2019-08-07 MED ORDER — ALBUTEROL SULFATE (2.5 MG/3ML) 0.083% IN NEBU: 2.5000 mg | INHALATION_SOLUTION | RESPIRATORY_TRACT | 12 refills | Status: DC | PRN

## 2019-08-07 MED ORDER — ACETAMINOPHEN 325 MG PO TABS: 650.0000 mg | ORAL_TABLET | Freq: Four times a day (QID) | ORAL | Status: AC | PRN

## 2019-08-07 MED ORDER — PROCHLORPERAZINE EDISYLATE 10 MG/2ML IJ SOLN: 10.0000 mg | Freq: Four times a day (QID) | INTRAMUSCULAR | Status: DC | PRN

## 2019-08-07 MED ORDER — METOPROLOL TARTRATE 5 MG/5ML IV SOLN: 5.0000 mg | Freq: Three times a day (TID) | INTRAVENOUS | Status: DC

## 2019-08-07 MED ORDER — PROCHLORPERAZINE EDISYLATE 10 MG/2ML IJ SOLN
10.0000 mg | Freq: Four times a day (QID) | INTRAMUSCULAR | Status: DC | PRN
  Administered 2019-08-07 (×3): 10 mg via INTRAVENOUS
  Filled 2019-08-07 (×3): qty 2

## 2019-08-07 MED ORDER — TACROLIMUS 1 MG/ML ORAL SUSPENSION: 2.5000 mg | Freq: Two times a day (BID) | ORAL | Status: AC

## 2019-08-07 MED ORDER — MYCOPHENOLATE MOFETIL HCL 500 MG IV SOLR: 500.0000 mg | Freq: Two times a day (BID) | INTRAVENOUS | Status: DC

## 2019-08-07 NOTE — Progress Notes (Signed)
[facility] Progress Note    Assessment/ Plan:   1. Acute kidney Injury secondary to pre-renal insults from n/v. Cr currently stable at 1.6, continue with mIVF -Avoid nephrotoxic medications including NSAIDs and iodinated intravenous contrast exposure unless the latter is absolutely indicated.  Preferred narcotic agents for pain control are hydromorphone, fentanyl, and methadone. Morphine should not be used. Avoid Baclofen and avoid oral sodium phosphate and magnesium citrate based laxatives / bowel preps. Continue strict Input and Output monitoring. Will monitor the patient closely with you and intervene or adjust therapy as indicated by changes in clinical status/labs  2. 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  We will adjust tacrolimus as needed and will be targeting a goal trough of 7-9.  Today's trough level is pending -Continue with IV conversion to Myfortic.  On CellCept 1000 mg twice daily Continue with stress dose Solu-Cortef -CMV and BK titers pending 3. SBO on ngt, low intermittent suction on possible receiving oral solution of Prograf.  Management per primary and surgical team.  Open to be transferred to [facility] for further surgical management  Immunosuppression Management: High Risk Medical Decision Making For Drug Therapy Requiring Intensive Monitoring For Toxicity Will continue intensive monitoring for drug levels and toxicity via regularly scheduled laboratory assays given the narrow therapeutic index of the immunosuppressive drug therapy listed above  Subjective:   Feels well, has had numerous episodes of vomiting since being here. Denies abd pain currently. ngt in place on low int suction (paused for some time while receiving oral soln of prograf which she tolerated)   Objective:   BP (!) 182/88 (BP Location: Left Arm)   Pulse 81   Temp  98.5 F (36.9 C)   Resp 19   Ht 5\' 7"  (1.702 m)   Wt 91.3 kg   SpO2 99%   BMI 31.52 kg/m   Intake/Output Summary (Last 24 hours) at 08/07/2019 1538 Last data filed at 08/07/2019 0945 Gross per 24 hour  Intake 1369.64 ml  Output 1050 ml  Net 319.64 ml   Weight change:   Physical Exam: Gen:nad CVS: rrr, s1s2 Resp:cta bl, no w/r/r/c WRU:[id number], tender Ext:no edema Neuro: AAO x3, speech clear and coherent  Imaging: CT ABDOMEN PELVIS WO CONTRAST  Result Date: 08/06/2019 CLINICAL DATA:  Abdominal pain all over with nausea vomiting for 2 days, history pancreatitis EXAM: CT ABDOMEN AND PELVIS WITHOUT CONTRAST TECHNIQUE: Multidetector CT imaging of the abdomen and pelvis was performed following the standard protocol without IV contrast. IV contrast not administered due to abnormal renal function. No oral contrast administered. Sagittal and coronal MPR images reconstructed from axial data set. COMPARISON:  09/29/2017 FINDINGS: Lower chest: Mitral annular calcification. Visualized lung bases clear Hepatobiliary: Gallbladder and liver unremarkable Pancreas: Normal appearance without mass or surrounding inflammatory changes. No calcifications or definite ductal dilatation. Spleen: Capsular calcification at spleen question sequela of prior hemorrhage or trauma. No focal mass. Adrenals/Urinary Tract: Unremarkable adrenal glands. Atrophic kidneys consistent with end-stage renal disease. Tiny nonobstructing calculus at inferior pole LEFT kidney. Complicated cyst arising from lateral aspect mid to upper LEFT kidney 16 x 12 mm, stable size though some demonstrating slightly greater calcification. Transplant kidney RIGHT iliac fossa without mass or hydronephrosis. Bladder unremarkable. Stomach/Bowel: Stomach normal appearance. Scattered colonic diverticulosis without evidence of diverticulitis. Colon otherwise unremarkable. Appendix not visualized but no pericecal inflammatory process seen. Terminal ileum  decompressed.  Remaining small bowel diffusely dilated consistent with distal small bowel obstruction. Transition zone is in the RIGHT upper pelvis approximately image 58. No bowel wall thickening. Vascular/Lymphatic: Extensive atherosclerotic calcifications aorta, iliac arteries, coronary arteries. Aorta normal caliber. Extensive calcifications along the LEFT external iliac vessels question related to prior surgery. No adenopathy. Reproductive: Unremarkable uterus.  No adnexal masses. Other: No free air or free fluid.  No hernia. Musculoskeletal: Superior endplate deformities of T11 and T12 unchanged. IMPRESSION: Distal small bowel obstruction with transition zone in the RIGHT upper pelvis, favor adhesion. Colonic diverticulosis without evidence of diverticulitis. Transplant kidney RIGHT iliac fossa without hydronephrosis. Atrophic kidneys consistent with end-stage renal disease. Extensive atherosclerotic calcifications. Aortic Atherosclerosis (ICD10-I70.0). Electronically Signed   By: Lavonia Dana M.D.   On: 08/06/2019 10:36   DG Lumbar Spine Complete  Result Date: 08/06/2019 CLINICAL DATA:  Fall. Additional provided: Sharp pain lower back (right greater than left). EXAM: LUMBAR SPINE - COMPLETE 4+ VIEW COMPARISON:  CT abdomen/pelvis 09/29/2017. FINDINGS: Five lumbar vertebrae in correlating with prior CT 09/29/2017. No significant spondylolisthesis. Redemonstrated mild chronic T11 and T12 vertebral body height loss with unchanged superior endplate irregularity at these levels. No lumbar compression deformity. Mild multilevel disc space narrowing, greatest at L5-S1. Mild lower lumbar facet arthrosis. Aortic atherosclerosis. IMPRESSION: No lumbar compression deformity is demonstrated. Unchanged mild chronic T11 and T12 vertebral body height loss. Mild lumbar spondylosis as outlined, greatest within the lower lumbar spine. Electronically Signed   By: Kellie Simmering DO   On: 08/06/2019 10:01   CT Cervical Spine Wo  Contrast  Result Date: 08/06/2019 CLINICAL DATA:  Acute neck pain EXAM: CT CERVICAL SPINE WITHOUT CONTRAST TECHNIQUE: Multidetector CT imaging of the cervical spine was performed without intravenous contrast. Multiplanar CT image reconstructions were also generated. COMPARISON:  None. FINDINGS: Alignment: Normal Skull base and vertebrae: There is anterior fusion from C4-C7 and posterior fusion from C3 to C7. There is abnormal lucency surrounding the transpedicular screws at C3. No other abnormal perihardware lucency. The spinal canal is posteriorly decompressed from C3 to C5. Soft tissues and spinal canal: No prevertebral fluid or swelling. No visible canal hematoma. Disc levels:  No bony spinal canal stenosis. Upper chest: Negative. Other: None. IMPRESSION: 1. No acute fracture or static subluxation of the cervical spine. 2. Anterior and posterior fusion from C3 to C7 with abnormal lucency surrounding the transpedicular screws at C3, consistent with loosening. Electronically Signed   By: Ulyses Jarred M.D.   On: 08/06/2019 03:38   DG Chest Portable 1 View  Result Date: 08/06/2019 CLINICAL DATA:  NG tube placement. History of abdominal pain and rectal bleeding, nausea and vomiting. EXAM: PORTABLE CHEST 1 VIEW COMPARISON:  September 29, 2017 FINDINGS: Film is rotated to the RIGHT. Postoperative changes are noted about the thoracic inlet and in the lower cervical spine and upper thoracic spine, incompletely imaged. Cardiomediastinal contours accounting for degree of rotation are unchanged. Subtle basilar opacities are demonstrated with elevation of LEFT hemidiaphragm as compared to previous imaging, slightly elevated compared to the LEFT. No dense consolidation. Signs of stenting along the LEFT upper extremity tracking into the axilla with similar appearance. Gastric tube in place tip off the field of the radiograph. Tube courses below the LEFT hemidiaphragm. Dextroconvex curvature of the thoracic spine similar  to the prior study. No acute osseous abnormality on limited assessment. IMPRESSION: 1. Subtle basilar opacities with elevation of LEFT hemidiaphragm as compared to previous imaging. May represent basilar atelectasis or developing infection. Correlate with  any clinical risk for aspiration. Electronically Signed   By: Zetta Bills M.D.   On: 08/06/2019 11:52   DG Abd Portable 1V-Small Bowel Obstruction Protocol-initial, 8 hr delay  Result Date: 08/07/2019 CLINICAL DATA:  Small-bowel obstruction EXAM: PORTABLE ABDOMEN - 1 VIEW COMPARISON:  08/06/2019 FINDINGS: 2 supine frontal views of the abdomen and pelvis are obtained 8 hours after oral contrast was administered. Contrast is seen within the gastric fundus. There is no appreciable progression of contrast throughout the small bowel. Continued gaseous distension of jejunum is seen within the left mid abdomen. There is a paucity of gas throughout the colon. IMPRESSION: 1. Retention of oral contrast within the stomach. 2. Persistent gaseous distension of the small bowel, compatible with small-bowel obstruction. Electronically Signed   By: Randa Ngo M.D.   On: 08/07/2019 01:37   DG Abd Portable 1V-Small Bowel Protocol-Position Verification  Result Date: 08/06/2019 CLINICAL DATA:  Nasogastric tube placement EXAM: PORTABLE ABDOMEN - 1 VIEW COMPARISON:  Portable exam 1328 hours compared to 01/04/2007 FINDINGS: Tip of nasogastric tube projects over mid stomach. Calcification along the LEFT diaphragm. Paucity of bowel gas. Bones demineralized with thoracolumbar scoliosis. IMPRESSION: Tip of nasogastric tube projects over mid stomach Electronically Signed   By: Lavonia Dana M.D.   On: 08/06/2019 13:54    Labs: BMET Recent Labs  Lab 08/06/19 0216 08/06/19 2038 08/07/19 0132  NA 137  --  136  K 4.2  --  5.0  CL 104  --  104  CO2 22  --  21*  GLUCOSE 106*  --  115*  BUN 18  --  19  CREATININE 1.64* 1.70* 1.60*  CALCIUM 10.4*  --  10.1   CBC Recent  Labs  Lab 08/06/19 0216 08/06/19 2038 08/07/19 0132  WBC 7.4 9.4 8.3  HGB 13.5 13.7 14.1  HCT 43.7 44.3 45.7  MCV 98.0 96.5 96.6  PLT 249 241 258    Medications:    . heparin  5,000 Units Subcutaneous Q8H  . hydrocortisone sod succinate (SOLU-CORTEF) inj  50 mg Intravenous Q8H  . metoprolol tartrate  5 mg Intravenous Q8H  . tacrolimus  2.5 mg Oral BID      Gean Quint, MD [facility] 08/07/2019, 3:38 PM

## 2019-08-07 NOTE — Progress Notes (Signed)
[facility] Progress Note     Subjective: CC-  Less abdominal pain today, but states that she is still bloated. Denies n/v. No flatus or BM. 750cc out from NG tube. Xray shows contrast in stomach and persistent SBO. WBC 8.3, afebrile  Objective: Vital signs in last 24 hours: Temp:  [98.6 F (37 C)-99.8 F (37.7 C)] 98.9 F (37.2 C) (07/21 0954) Pulse Rate:  [73-97] 86 (07/21 0954) Resp:  [13-25] 14 (07/21 0954) BP: (124-175)/(33-98) 175/98 (07/21 0954) SpO2:  [97 %-100 %] 99 % (07/21 0954) Weight:  [91.3 kg] 91.3 kg (07/20 2001)    Intake/Output from previous day: 07/20 0701 - 07/21 0700 In: 1369.6 [I.V.:1254.6; NG/GT:30; IV Piggyback:85] Out: 750 [Emesis/NG output:750] Intake/Output this shift: No intake/output data recorded.  PE: Gen:  Alert, NAD, pleasant HEENT: EOM's intact, pupils equal and round Card:  RRR Pulm:  CTAB, no W/R/R, rate and effort normal Abd: Soft, distended, mild diffuse TTP mostly in the central abdomen without rebound or guarding, +BS Skin: no rashes noted, warm and dry  Lab Results:  Recent Labs    08/06/19 2038 08/07/19 0132  WBC 9.4 8.3  HGB 13.7 14.1  HCT 44.3 45.7  PLT 241 258   BMET Recent Labs    08/06/19 0216 08/06/19 0216 08/06/19 2038 08/07/19 0132  NA 137  --   --  136  K 4.2  --   --  5.0  CL 104  --   --  104  CO2 22  --   --  21*  GLUCOSE 106*  --   --  115*  BUN 18  --   --  19  CREATININE 1.64*   < > 1.70* 1.60*  CALCIUM 10.4*  --   --  10.1   < > = values in this interval not displayed.   PT/INR No results for input(s): LABPROT, INR in the last 72 hours. CMP     Component Value Date/Time   NA 136 08/07/2019 0132   K 5.0 08/07/2019 0132   CL 104 08/07/2019 0132   CO2 21 (L) 08/07/2019 0132   GLUCOSE 115 (H) 08/07/2019 0132   BUN 19 08/07/2019 0132   CREATININE 1.60 (H) 08/07/2019 0132   CALCIUM 10.1 08/07/2019 0132   PROT 7.6 08/06/2019 0216   ALBUMIN 4.5 08/06/2019 0216   AST 22  08/06/2019 0216   ALT 10 08/06/2019 0216   ALKPHOS 44 08/06/2019 0216   BILITOT 0.8 08/06/2019 0216   GFRNONAA 35 (L) 08/07/2019 0132   GFRAA 41 (L) 08/07/2019 0132   Lipase     Component Value Date/Time   LIPASE 32 08/06/2019 0216       Studies/Results: CT ABDOMEN PELVIS WO CONTRAST  Result Date: 08/06/2019 CLINICAL DATA:  Abdominal pain all over with nausea vomiting for 2 days, history pancreatitis EXAM: CT ABDOMEN AND PELVIS WITHOUT CONTRAST TECHNIQUE: Multidetector CT imaging of the abdomen and pelvis was performed following the standard protocol without IV contrast. IV contrast not administered due to abnormal renal function. No oral contrast administered. Sagittal and coronal MPR images reconstructed from axial data set. COMPARISON:  09/29/2017 FINDINGS: Lower chest: Mitral annular calcification. Visualized lung bases clear Hepatobiliary: Gallbladder and liver unremarkable Pancreas: Normal appearance without mass or surrounding inflammatory changes. No calcifications or definite ductal dilatation. Spleen: Capsular calcification at spleen question sequela of prior hemorrhage or trauma. No focal mass. Adrenals/Urinary Tract: Unremarkable adrenal glands. Atrophic kidneys consistent with end-stage renal disease. Tiny nonobstructing calculus at inferior  pole LEFT kidney. Complicated cyst arising from lateral aspect mid to upper LEFT kidney 16 x 12 mm, stable size though some demonstrating slightly greater calcification. Transplant kidney RIGHT iliac fossa without mass or hydronephrosis. Bladder unremarkable. Stomach/Bowel: Stomach normal appearance. Scattered colonic diverticulosis without evidence of diverticulitis. Colon otherwise unremarkable. Appendix not visualized but no pericecal inflammatory process seen. Terminal ileum decompressed. Remaining small bowel diffusely dilated consistent with distal small bowel obstruction. Transition zone is in the RIGHT upper pelvis approximately image 58.  No bowel wall thickening. Vascular/Lymphatic: Extensive atherosclerotic calcifications aorta, iliac arteries, coronary arteries. Aorta normal caliber. Extensive calcifications along the LEFT external iliac vessels question related to prior surgery. No adenopathy. Reproductive: Unremarkable uterus.  No adnexal masses. Other: No free air or free fluid.  No hernia. Musculoskeletal: Superior endplate deformities of T11 and T12 unchanged. IMPRESSION: Distal small bowel obstruction with transition zone in the RIGHT upper pelvis, favor adhesion. Colonic diverticulosis without evidence of diverticulitis. Transplant kidney RIGHT iliac fossa without hydronephrosis. Atrophic kidneys consistent with end-stage renal disease. Extensive atherosclerotic calcifications. Aortic Atherosclerosis (ICD10-I70.0). Electronically Signed   By: Lavonia Dana M.D.   On: 08/06/2019 10:36   DG Lumbar Spine Complete  Result Date: 08/06/2019 CLINICAL DATA:  Fall. Additional provided: Sharp pain lower back (right greater than left). EXAM: LUMBAR SPINE - COMPLETE 4+ VIEW COMPARISON:  CT abdomen/pelvis 09/29/2017. FINDINGS: Five lumbar vertebrae in correlating with prior CT 09/29/2017. No significant spondylolisthesis. Redemonstrated mild chronic T11 and T12 vertebral body height loss with unchanged superior endplate irregularity at these levels. No lumbar compression deformity. Mild multilevel disc space narrowing, greatest at L5-S1. Mild lower lumbar facet arthrosis. Aortic atherosclerosis. IMPRESSION: No lumbar compression deformity is demonstrated. Unchanged mild chronic T11 and T12 vertebral body height loss. Mild lumbar spondylosis as outlined, greatest within the lower lumbar spine. Electronically Signed   By: Kellie Simmering DO   On: 08/06/2019 10:01   CT Cervical Spine Wo Contrast  Result Date: 08/06/2019 CLINICAL DATA:  Acute neck pain EXAM: CT CERVICAL SPINE WITHOUT CONTRAST TECHNIQUE: Multidetector CT imaging of the cervical spine was  performed without intravenous contrast. Multiplanar CT image reconstructions were also generated. COMPARISON:  None. FINDINGS: Alignment: Normal Skull base and vertebrae: There is anterior fusion from C4-C7 and posterior fusion from C3 to C7. There is abnormal lucency surrounding the transpedicular screws at C3. No other abnormal perihardware lucency. The spinal canal is posteriorly decompressed from C3 to C5. Soft tissues and spinal canal: No prevertebral fluid or swelling. No visible canal hematoma. Disc levels:  No bony spinal canal stenosis. Upper chest: Negative. Other: None. IMPRESSION: 1. No acute fracture or static subluxation of the cervical spine. 2. Anterior and posterior fusion from C3 to C7 with abnormal lucency surrounding the transpedicular screws at C3, consistent with loosening. Electronically Signed   By: Ulyses Jarred M.D.   On: 08/06/2019 03:38   DG Chest Portable 1 View  Result Date: 08/06/2019 CLINICAL DATA:  NG tube placement. History of abdominal pain and rectal bleeding, nausea and vomiting. EXAM: PORTABLE CHEST 1 VIEW COMPARISON:  September 29, 2017 FINDINGS: Film is rotated to the RIGHT. Postoperative changes are noted about the thoracic inlet and in the lower cervical spine and upper thoracic spine, incompletely imaged. Cardiomediastinal contours accounting for degree of rotation are unchanged. Subtle basilar opacities are demonstrated with elevation of LEFT hemidiaphragm as compared to previous imaging, slightly elevated compared to the LEFT. No dense consolidation. Signs of stenting along the LEFT upper extremity tracking into the  axilla with similar appearance. Gastric tube in place tip off the field of the radiograph. Tube courses below the LEFT hemidiaphragm. Dextroconvex curvature of the thoracic spine similar to the prior study. No acute osseous abnormality on limited assessment. IMPRESSION: 1. Subtle basilar opacities with elevation of LEFT hemidiaphragm as compared to  previous imaging. May represent basilar atelectasis or developing infection. Correlate with any clinical risk for aspiration. Electronically Signed   By: Zetta Bills M.D.   On: 08/06/2019 11:52   DG Abd Portable 1V-Small Bowel Obstruction Protocol-initial, 8 hr delay  Result Date: 08/07/2019 CLINICAL DATA:  Small-bowel obstruction EXAM: PORTABLE ABDOMEN - 1 VIEW COMPARISON:  08/06/2019 FINDINGS: 2 supine frontal views of the abdomen and pelvis are obtained 8 hours after oral contrast was administered. Contrast is seen within the gastric fundus. There is no appreciable progression of contrast throughout the small bowel. Continued gaseous distension of jejunum is seen within the left mid abdomen. There is a paucity of gas throughout the colon. IMPRESSION: 1. Retention of oral contrast within the stomach. 2. Persistent gaseous distension of the small bowel, compatible with small-bowel obstruction. Electronically Signed   By: Randa Ngo M.D.   On: 08/07/2019 01:37   DG Abd Portable 1V-Small Bowel Protocol-Position Verification  Result Date: 08/06/2019 CLINICAL DATA:  Nasogastric tube placement EXAM: PORTABLE ABDOMEN - 1 VIEW COMPARISON:  Portable exam 1328 hours compared to 01/04/2007 FINDINGS: Tip of nasogastric tube projects over mid stomach. Calcification along the LEFT diaphragm. Paucity of bowel gas. Bones demineralized with thoracolumbar scoliosis. IMPRESSION: Tip of nasogastric tube projects over mid stomach Electronically Signed   By: Lavonia Dana M.D.   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 Continue NPO/NGT to LIWS. Mobilize. Plan for 24hr follow up film.   LOS: 1 day    Wellington Hampshire, [facility] 08/07/2019, 10:07 AM Please see Amion for pager number during day hours 7:00am-4:30pm

## 2019-08-07 NOTE — Discharge Summary (Signed)
[facility] Discharge Summary   Patient ID: [name] MRN: [id number] DOB/AGE: 1960/11/11 59 y.o.  Admit date: 08/06/2019 Discharge date: 08/07/2019  Admitting Diagnosis: Small bowel obstruction AKIonCKDstage IIIa,s/p renal transplant Heart murmur Near syncope Neck pain HTN HLD Anxiety  Discharge Diagnosis Same  Consultants General surgery Nephrology  Imaging: CT ABDOMEN PELVIS WO CONTRAST  Result Date: 08/06/2019 CLINICAL DATA:  Abdominal pain all over with nausea vomiting for 2 days, history pancreatitis EXAM: CT ABDOMEN AND PELVIS WITHOUT CONTRAST TECHNIQUE: Multidetector CT imaging of the abdomen and pelvis was performed following the standard protocol without IV contrast. IV contrast not administered due to abnormal renal function. No oral contrast administered. Sagittal and coronal MPR images reconstructed from axial data set. COMPARISON:  09/29/2017 FINDINGS: Lower chest: Mitral annular calcification. Visualized lung bases clear Hepatobiliary: Gallbladder and liver unremarkable Pancreas: Normal appearance without mass or surrounding inflammatory changes. No calcifications or definite ductal dilatation. Spleen: Capsular calcification at spleen question sequela of prior hemorrhage or trauma. No focal mass. Adrenals/Urinary Tract: Unremarkable adrenal glands. Atrophic kidneys consistent with end-stage renal disease. Tiny nonobstructing calculus at inferior pole LEFT kidney. Complicated cyst arising from lateral aspect mid to upper LEFT kidney 16 x 12 mm, stable size though some demonstrating slightly greater calcification. Transplant kidney RIGHT iliac fossa without mass or hydronephrosis. Bladder unremarkable. Stomach/Bowel: Stomach normal appearance. Scattered colonic diverticulosis without evidence of diverticulitis. Colon otherwise unremarkable. Appendix not visualized but no pericecal inflammatory process seen. Terminal ileum decompressed. Remaining small bowel  diffusely dilated consistent with distal small bowel obstruction. Transition zone is in the RIGHT upper pelvis approximately image 58. No bowel wall thickening. Vascular/Lymphatic: Extensive atherosclerotic calcifications aorta, iliac arteries, coronary arteries. Aorta normal caliber. Extensive calcifications along the LEFT external iliac vessels question related to prior surgery. No adenopathy. Reproductive: Unremarkable uterus.  No adnexal masses. Other: No free air or free fluid.  No hernia. Musculoskeletal: Superior endplate deformities of T11 and T12 unchanged. IMPRESSION: Distal small bowel obstruction with transition zone in the RIGHT upper pelvis, favor adhesion. Colonic diverticulosis without evidence of diverticulitis. Transplant kidney RIGHT iliac fossa without hydronephrosis. Atrophic kidneys consistent with end-stage renal disease. Extensive atherosclerotic calcifications. Aortic Atherosclerosis (ICD10-I70.0). Electronically Signed   By: Lavonia Dana M.D.   On: 08/06/2019 10:36   DG Lumbar Spine Complete  Result Date: 08/06/2019 CLINICAL DATA:  Fall. Additional provided: Sharp pain lower back (right greater than left). EXAM: LUMBAR SPINE - COMPLETE 4+ VIEW COMPARISON:  CT abdomen/pelvis 09/29/2017. FINDINGS: Five lumbar vertebrae in correlating with prior CT 09/29/2017. No significant spondylolisthesis. Redemonstrated mild chronic T11 and T12 vertebral body height loss with unchanged superior endplate irregularity at these levels. No lumbar compression deformity. Mild multilevel disc space narrowing, greatest at L5-S1. Mild lower lumbar facet arthrosis. Aortic atherosclerosis. IMPRESSION: No lumbar compression deformity is demonstrated. Unchanged mild chronic T11 and T12 vertebral body height loss. Mild lumbar spondylosis as outlined, greatest within the lower lumbar spine. Electronically Signed   By: Kellie Simmering DO   On: 08/06/2019 10:01   CT Cervical Spine Wo Contrast  Result Date:  08/06/2019 CLINICAL DATA:  Acute neck pain EXAM: CT CERVICAL SPINE WITHOUT CONTRAST TECHNIQUE: Multidetector CT imaging of the cervical spine was performed without intravenous contrast. Multiplanar CT image reconstructions were also generated. COMPARISON:  None. FINDINGS: Alignment: Normal Skull base and vertebrae: There is anterior fusion from C4-C7 and posterior fusion from C3 to C7. There is abnormal lucency surrounding the transpedicular screws at C3. No other abnormal perihardware lucency.  The spinal canal is posteriorly decompressed from C3 to C5. Soft tissues and spinal canal: No prevertebral fluid or swelling. No visible canal hematoma. Disc levels:  No bony spinal canal stenosis. Upper chest: Negative. Other: None. IMPRESSION: 1. No acute fracture or static subluxation of the cervical spine. 2. Anterior and posterior fusion from C3 to C7 with abnormal lucency surrounding the transpedicular screws at C3, consistent with loosening. Electronically Signed   By: Ulyses Jarred M.D.   On: 08/06/2019 03:38   DG Chest Portable 1 View  Result Date: 08/06/2019 CLINICAL DATA:  NG tube placement. History of abdominal pain and rectal bleeding, nausea and vomiting. EXAM: PORTABLE CHEST 1 VIEW COMPARISON:  September 29, 2017 FINDINGS: Film is rotated to the RIGHT. Postoperative changes are noted about the thoracic inlet and in the lower cervical spine and upper thoracic spine, incompletely imaged. Cardiomediastinal contours accounting for degree of rotation are unchanged. Subtle basilar opacities are demonstrated with elevation of LEFT hemidiaphragm as compared to previous imaging, slightly elevated compared to the LEFT. No dense consolidation. Signs of stenting along the LEFT upper extremity tracking into the axilla with similar appearance. Gastric tube in place tip off the field of the radiograph. Tube courses below the LEFT hemidiaphragm. Dextroconvex curvature of the thoracic spine similar to the prior study. No  acute osseous abnormality on limited assessment. IMPRESSION: 1. Subtle basilar opacities with elevation of LEFT hemidiaphragm as compared to previous imaging. May represent basilar atelectasis or developing infection. Correlate with any clinical risk for aspiration. Electronically Signed   By: Zetta Bills M.D.   On: 08/06/2019 11:52   DG Abd Portable 1V  Result Date: 08/07/2019 CLINICAL DATA:  Small-bowel obstruction EXAM: PORTABLE ABDOMEN - 1 VIEW COMPARISON:  08/07/2019 FINDINGS: Nasogastric tube overlies the proximal body of the stomach with the sidehole positioned just beyond the gastroesophageal junction, unchanged. Multiple gas-filled dilated loops of small bowel are again seen within the left mid abdomen, in keeping with a persistent small bowel obstruction. No gross free intraperitoneal gas. IMPRESSION: Stable gas-filled dilated loops of small bowel in keeping with a suspected mid small bowel obstruction. Electronically Signed   By: Fidela Salisbury MD   On: 08/07/2019 18:33   DG Abd Portable 1V-Small Bowel Obstruction Protocol-initial, 8 hr delay  Result Date: 08/07/2019 CLINICAL DATA:  Small-bowel obstruction EXAM: PORTABLE ABDOMEN - 1 VIEW COMPARISON:  08/06/2019 FINDINGS: 2 supine frontal views of the abdomen and pelvis are obtained 8 hours after oral contrast was administered. Contrast is seen within the gastric fundus. There is no appreciable progression of contrast throughout the small bowel. Continued gaseous distension of jejunum is seen within the left mid abdomen. There is a paucity of gas throughout the colon. IMPRESSION: 1. Retention of oral contrast within the stomach. 2. Persistent gaseous distension of the small bowel, compatible with small-bowel obstruction. Electronically Signed   By: Randa Ngo M.D.   On: 08/07/2019 01:37   DG Abd Portable 1V-Small Bowel Protocol-Position Verification  Result Date: 08/06/2019 CLINICAL DATA:  Nasogastric tube placement EXAM: PORTABLE  ABDOMEN - 1 VIEW COMPARISON:  Portable exam 1328 hours compared to 01/04/2007 FINDINGS: Tip of nasogastric tube projects over mid stomach. Calcification along the LEFT diaphragm. Paucity of bowel gas. Bones demineralized with thoracolumbar scoliosis. IMPRESSION: Tip of nasogastric tube projects over mid stomach Electronically Signed   By: Lavonia Dana M.D.   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  Her symptoms progressively worsened later in the afternoon resulting in several episodes of vomiting. Patient does also report a near syncopal event yesterday evening while getting up from the commode, after she had several bouts of vomiting/diarrhea.  She states she felt dizzy and held onto the wall and in the process, twisted her head and neck towards the wall.  She noted a popping sensation in her neck associated with some pain thereafter.    Hospital Course:  In the ED patient was Afebrile, pulse 80, respiratory rate 18, BP 156/81.  Pulse ox 100% on room air.  CBC within normal limits with WBC 7.4, hemoglobin 13.5, BMP shows chronic kidney disease with BUN 18, creatinine 1.64.  BG 106, sodium/potassium WNL.  Lipase 32.Chest x-ray showed Subtle basilar opacities with elevation of LEFT hemidiaphragm as compared to previous imaging, likely representing atelectasis.  CT cervical spine did not show any acute fracture or subluxation but reported "Anterior and posterior fusion from C3 to C7 with abnormal lucency surrounding the transpedicular screws at C3, consistent with loosening." CT abdomen without contrast revealed "Distal small bowel  obstruction with transition zone in the RIGHT upper pelvis, favor adhesion. Colonic diverticulosis without evidence of diverticulitis.Transplant kidney RIGHT iliac fossa without hydronephrosis.  Atrophic kidneys consistent with end-stage renal disease.  Extensive atherosclerosis".    Patient was admitted to the medical service.  Near syncopal event felt to be likely vagal in setting of nausea, vomiting, and dehydration. CT c-spine negative for acute injury.  Nephrology was consulted for assistance with management of AKI in patient with CKD-IIIa s/p renal transplant. AKI felt to be due to volume losses due to N/V. She was kept on maintenance IVF. Creatinine was monitored and did down trend. Creatine 1.6 on 7/21 from 1.7 the day before. Baseline creatinine 1.2-1.3.  Her home maintenance immunosuppression includes tacrolimus 2.5mg  BID, prednisone 7.5mg  daily, and myfortic 720mg  bid. During admission prednisone was changed to Solu-cortef, and myfortic was ordered as IV conversion to cellcept IV 1000mg  bid.  General surgery was consulted for management of SBO. NG tube was placed and patient started on small bowel protocol with gastrograffin administration and delayed abdominal films. Abdominal pain and bloating did somewhat improve, but unfortunately the patient had no return in bowel function and a persistent obstruction. The case was discussed with Dr. Ulice Dash of the transplant team at [facility]. Due to the patient's history of renal transplant, and the possibility that she may require surgical intervention for SBO, she was accepted in transfer to [facility]. The patient was transferred in good condition on 08/07/19.     Physical Exam: General:  A/O x4, No acute respiratory distress Eyes: negative scleral hemorrhage, negative anisocoria, negative icterus ENT: Negative Runny nose, negative gingival bleeding, Neck:  Negative scars, masses, torticollis, lymphadenopathy, JVD, mild pain to rotation  of neck (just received pain medication) Lungs: Clear to auscultation bilaterally without wheezes or crackles Cardiovascular: Regular rate and rhythm without murmur gallop or rub normal S1 and S2 Abdomen: negative abdominal pain, nondistended, positive soft, bowel sounds, no rebound, no ascites, no appreciable mass Extremities: No significant cyanosis, clubbing, or edema bilateral lower extremities Skin: Negative rashes, lesions, ulcers Psychiatric:  Negative depression, negative  anxiety, negative fatigue, negative mania  Central nervous system:  Cranial nerves II through XII intact, tongue/uvula midline, all extremities muscle strength 5/5, sensation intact throughout,  negative dysarthria, negative expressive aphasia, negative receptive aphasia.   Allergies as of 08/07/2019      Reactions   Contrast Media [iodinated Diagnostic Agents] Other (See Comments)   Cant take due to kidney transplant   Nsaids Other (See Comments)   Cant take due to kidney transplant    Penicillins Hives, Other (See Comments)   PATIENT HAS HAD A PCN REACTION WITH IMMEDIATE RASH, FACIAL/TONGUE/THROAT SWELLING, SOB, OR LIGHTHEADEDNESS WITH HYPOTENSION:  #  #  YES  #  #  Has patient had a PCN reaction causing severe rash involving mucus membranes or skin necrosis: No Has patient had a PCN reaction that required hospitalization No Has patient had a PCN reaction occurring within the last 10 years: No If all of the above answers are "NO", then may proceed with Cephalosporin   Ace Inhibitors Hives, Nausea And Vomiting   Codeine Hives      Medication List    STOP taking these medications   mycophenolate 180 MG EC tablet Commonly known as: MYFORTIC   Prograf 0.5 MG capsule Generic drug: tacrolimus   tacrolimus 1 MG capsule Commonly known as: PROGRAF Replaced by: tacrolimus 1 mg/mL Susp     TAKE these medications   acetaminophen 325 MG tablet Commonly known as: TYLENOL Take 2 tablets (650 mg total) by mouth  every 6 (six) hours as needed for mild pain (or Fever >/= 101). What changed:   medication strength  how much to take  reasons to take this   albuterol (2.5 MG/3ML) 0.083% nebulizer solution Commonly known as: PROVENTIL Take 3 mLs (2.5 mg total) by nebulization every 2 (two) hours as needed for wheezing.   ALPRAZolam 0.25 MG tablet Commonly known as: XANAX Take 1 tablet (0.25 mg total) by mouth daily as needed for anxiety.   aspirin 81 MG chewable tablet Chew 81 mg by mouth daily.   b complex-vitamin c-folic acid 0.8 MG Tabs tablet Take 1 tablet by mouth daily.   fentaNYL 100 MCG/2ML injection Commonly known as: SUBLIMAZE Inject 0.25-1 mLs (12.5-50 mcg total) into the vein every 4 (four) hours as needed for severe pain.   gabapentin 300 MG capsule Commonly known as: NEURONTIN Take 2 capsules (600 mg total) by mouth 2 (two) times daily as needed (nerve pain).   heparin 5000 UNIT/ML injection Inject 1 mL (5,000 Units total) into the skin every 8 (eight) hours.   hydrALAZINE 20 MG/ML injection Commonly known as: APRESOLINE Inject 0.5 mLs (10 mg total) into the vein every 4 (four) hours as needed.   hydrocortisone sodium succinate 100 MG Solr injection Commonly known as: SOLU-CORTEF Inject 1 mL (50 mg total) into the vein every 8 (eight) hours.   LORazepam 2 MG/ML injection Commonly known as: ATIVAN Inject 0.25 mLs (0.5 mg total) into the vein every 12 (twelve) hours as needed for anxiety.   metoprolol tartrate 5 MG/5ML Soln injection Commonly known as: LOPRESSOR Inject 5 mLs (5 mg total) into the vein every 8 (eight) hours.   mycophenolate 500 mg in dextrose 5 % 70 mL Inject 500 mg into the vein every 12 (twelve) hours.   ondansetron 4 MG tablet Commonly known as: ZOFRAN Take 1 tablet (4 mg total) by mouth every 6 (six) hours as needed for nausea.   ondansetron 4 MG/2ML Soln injection Commonly known as: ZOFRAN Inject  2 mLs (4 mg total) into the vein every 6  (six) hours as needed for nausea.   polyethylene glycol 17 g packet Commonly known as: MIRALAX / GLYCOLAX Take 17 g by mouth daily. What changed:   when to take this  reasons to take this   predniSONE 2.5 MG tablet Commonly known as: DELTASONE Take 2.5 mg by mouth every morning. Take with 5mg  qd to =7.5mg  qd total   predniSONE 5 MG tablet Commonly known as: DELTASONE Take 5 mg by mouth daily. Take with 2.5mg  to = 7.5mg  qd total   prochlorperazine 10 MG/2ML injection Commonly known as: COMPAZINE Inject 2 mLs (10 mg total) into the vein every 6 (six) hours as needed (alternate as needed with Zofran).   rosuvastatin 5 MG tablet Commonly known as: CRESTOR Take 5 mg by mouth daily.   sodium bicarbonate 650 MG tablet Take 650 mg by mouth daily.   sulfamethoxazole-trimethoprim 400-80 MG tablet Commonly known as: BACTRIM Take 1 tablet by mouth every Monday, Wednesday, and Friday.   tacrolimus 1 mg/mL Susp Commonly known as: PROGRAF Take 2.5 mLs (2.5 mg total) by mouth 2 (two) times daily. Replaces: tacrolimus 1 MG capsule          Signed:

## 2019-08-08 ENCOUNTER — Telehealth: Payer: Self-pay | Admitting: *Deleted

## 2019-08-08 LAB — BK QUANT PCR (PLASMA/SERUM)
BK Quantitaion PCR: NEGATIVE copies/mL
Log10 BK Qn PCR: UNDETERMINED log10copy/mL

## 2019-08-08 LAB — CMV DNA, QUANTITATIVE, PCR
CMV DNA Quant: POSITIVE IU/mL
Log10 CMV Qn DNA Pl: UNDETERMINED log10 IU/mL

## 2019-08-08 NOTE — Progress Notes (Signed)
Pt ready for transfer to [facility]. Pt stable at the moment, vitals within normal limit. Pain med given. Belongings packed.

## 2019-08-08 NOTE — Progress Notes (Signed)
Pt Discharged to [facility] via [facility].

## 2019-08-08 NOTE — Telephone Encounter (Signed)
Pt was on TCM report  admitted 08/06/19 for evaluation for syncopal event felt to be likely vagal in setting of nausea, vomiting, and dehydration. Pt had CT abdomen w/o contrast which revealed "Distal small bowel obstruction with transition zonein the RIGHT upper pelvis, favor adhesion. Colonic diverticulosis without evidence of diverticulitis.Transplant kidney RIGHT iliac fossa without hydronephrosis. Atrophic kidneys consistent with end-stage renal disease. Extensive atherosclerosis". General surgery was consulted for management of SBO. NG tube was placed and patient started on small bowel protocol with gastrograffin administration and delayed abdominal films. Abdominal pain and bloating did somewhat improve, but unfortunately the patient had no return in bowel function and a persistent obstruction. The case was discussed with Dr. Ulice Dash of the transplant team at [facility]. Due to the patient's history of renal transplant, and the possibility that she may require surgical intervention for SBO, she was accepted in transfer to [facility]. The patient was D/C and transferred in good condition on 08/07/19.Margaret KitchenJohny Chess

## 2019-08-09 ENCOUNTER — Ambulatory Visit: Payer: Medicare Other

## 2019-08-09 DIAGNOSIS — Z789 Other specified health status: Secondary | ICD-10-CM | POA: Diagnosis not present

## 2019-08-09 DIAGNOSIS — K529 Noninfective gastroenteritis and colitis, unspecified: Secondary | ICD-10-CM | POA: Diagnosis present

## 2019-08-09 DIAGNOSIS — Z79899 Other long term (current) drug therapy: Secondary | ICD-10-CM | POA: Diagnosis not present

## 2019-08-09 DIAGNOSIS — F419 Anxiety disorder, unspecified: Secondary | ICD-10-CM | POA: Diagnosis present

## 2019-08-09 DIAGNOSIS — E039 Hypothyroidism, unspecified: Secondary | ICD-10-CM | POA: Diagnosis present

## 2019-08-09 DIAGNOSIS — K913 Postprocedural intestinal obstruction, unspecified as to partial versus complete: Secondary | ICD-10-CM | POA: Diagnosis not present

## 2019-08-09 DIAGNOSIS — Z4822 Encounter for aftercare following kidney transplant: Secondary | ICD-10-CM | POA: Diagnosis not present

## 2019-08-09 DIAGNOSIS — K861 Other chronic pancreatitis: Secondary | ICD-10-CM | POA: Diagnosis present

## 2019-08-09 DIAGNOSIS — I10 Essential (primary) hypertension: Secondary | ICD-10-CM | POA: Diagnosis present

## 2019-08-09 DIAGNOSIS — Z5181 Encounter for therapeutic drug level monitoring: Secondary | ICD-10-CM | POA: Diagnosis not present

## 2019-08-09 DIAGNOSIS — Z940 Kidney transplant status: Secondary | ICD-10-CM | POA: Diagnosis not present

## 2019-08-09 DIAGNOSIS — Z880 Allergy status to penicillin: Secondary | ICD-10-CM | POA: Diagnosis not present

## 2019-08-09 DIAGNOSIS — K56609 Unspecified intestinal obstruction, unspecified as to partial versus complete obstruction: Secondary | ICD-10-CM | POA: Diagnosis not present

## 2019-08-10 LAB — TACROLIMUS LEVEL: Tacrolimus (FK506) - LabCorp: 5.9 ng/mL (ref 2.0–20.0)

## 2019-08-13 DIAGNOSIS — Z8719 Personal history of other diseases of the digestive system: Secondary | ICD-10-CM | POA: Diagnosis not present

## 2019-08-13 DIAGNOSIS — Z981 Arthrodesis status: Secondary | ICD-10-CM | POA: Diagnosis not present

## 2019-08-13 DIAGNOSIS — D8989 Other specified disorders involving the immune mechanism, not elsewhere classified: Secondary | ICD-10-CM | POA: Diagnosis not present

## 2019-08-13 DIAGNOSIS — E872 Acidosis: Secondary | ICD-10-CM | POA: Diagnosis not present

## 2019-08-13 DIAGNOSIS — Z5181 Encounter for therapeutic drug level monitoring: Secondary | ICD-10-CM | POA: Diagnosis not present

## 2019-08-13 DIAGNOSIS — Z7952 Long term (current) use of systemic steroids: Secondary | ICD-10-CM | POA: Diagnosis not present

## 2019-08-13 DIAGNOSIS — Z792 Long term (current) use of antibiotics: Secondary | ICD-10-CM | POA: Diagnosis not present

## 2019-08-13 DIAGNOSIS — Z95828 Presence of other vascular implants and grafts: Secondary | ICD-10-CM | POA: Diagnosis not present

## 2019-08-13 DIAGNOSIS — I10 Essential (primary) hypertension: Secondary | ICD-10-CM | POA: Diagnosis not present

## 2019-08-13 DIAGNOSIS — B258 Other cytomegaloviral diseases: Secondary | ICD-10-CM | POA: Diagnosis not present

## 2019-08-13 DIAGNOSIS — Z940 Kidney transplant status: Secondary | ICD-10-CM | POA: Diagnosis not present

## 2019-08-13 DIAGNOSIS — Z4822 Encounter for aftercare following kidney transplant: Secondary | ICD-10-CM | POA: Diagnosis not present

## 2019-08-13 DIAGNOSIS — D649 Anemia, unspecified: Secondary | ICD-10-CM | POA: Diagnosis not present

## 2019-08-13 DIAGNOSIS — J449 Chronic obstructive pulmonary disease, unspecified: Secondary | ICD-10-CM | POA: Diagnosis not present

## 2019-08-13 DIAGNOSIS — Z79899 Other long term (current) drug therapy: Secondary | ICD-10-CM | POA: Diagnosis not present

## 2019-08-13 DIAGNOSIS — Z87891 Personal history of nicotine dependence: Secondary | ICD-10-CM | POA: Diagnosis not present

## 2019-08-13 LAB — CMV DNA BY PCR, QUALITATIVE: CMV DNA, Qual PCR: NEGATIVE

## 2019-08-14 ENCOUNTER — Encounter: Payer: Self-pay | Admitting: Internal Medicine

## 2019-08-14 ENCOUNTER — Other Ambulatory Visit: Payer: Self-pay

## 2019-08-14 ENCOUNTER — Ambulatory Visit (INDEPENDENT_AMBULATORY_CARE_PROVIDER_SITE_OTHER): Payer: Medicare Other | Admitting: Internal Medicine

## 2019-08-14 VITALS — BP 122/78 | HR 81 | Temp 99.0°F | Ht 67.0 in | Wt 197.0 lb

## 2019-08-14 DIAGNOSIS — M4802 Spinal stenosis, cervical region: Secondary | ICD-10-CM | POA: Diagnosis not present

## 2019-08-14 DIAGNOSIS — Z940 Kidney transplant status: Secondary | ICD-10-CM | POA: Diagnosis not present

## 2019-08-14 DIAGNOSIS — F419 Anxiety disorder, unspecified: Secondary | ICD-10-CM | POA: Diagnosis not present

## 2019-08-14 DIAGNOSIS — K56609 Unspecified intestinal obstruction, unspecified as to partial versus complete obstruction: Secondary | ICD-10-CM | POA: Diagnosis not present

## 2019-08-14 DIAGNOSIS — I10 Essential (primary) hypertension: Secondary | ICD-10-CM | POA: Diagnosis not present

## 2019-08-14 DIAGNOSIS — G992 Myelopathy in diseases classified elsewhere: Secondary | ICD-10-CM | POA: Diagnosis not present

## 2019-08-14 MED ORDER — ALPRAZOLAM 0.25 MG PO TABS: 0.2500 mg | ORAL_TABLET | Freq: Every day | ORAL | 5 refills | Status: DC | PRN

## 2019-08-14 NOTE — Patient Instructions (Addendum)
You can use salon pas with lidocaine which is safe on the kidneys and liver.   We have refilled the xanax.

## 2019-08-14 NOTE — Progress Notes (Signed)
   Subjective:   Patient ID: [name], female    DOB: 1960/06/16, 59 y.o.   MRN: [id number]  HPI The patient is a 59 YO female coming in for hospital follow up (in [place] for SBO treated conservatively and resolved with NG tube placement and fluids, renal function stable). She does have renal transplant and saw her transplant doctor about 2-3 days ago with stable renal function. Denies SOB, chest pains. Is eating and drinking fairly normally. Still having BM and passing gas normally. Denies abdominal pain or N/V. Still eating mostly soft and easy to digest foods. Avoiding red meats.   PMH, [facility], social history reviewed and updated  Review of Systems  Constitutional: Negative.   HENT: Negative.   Eyes: Negative.   Respiratory: Negative for cough, chest tightness and shortness of breath.   Cardiovascular: Negative for chest pain, palpitations and leg swelling.  Gastrointestinal: Negative for abdominal distention, abdominal pain, constipation, diarrhea, nausea and vomiting.  Musculoskeletal: Negative.   Skin: Negative.   Neurological: Negative.   Psychiatric/Behavioral: Negative.     Objective:  Physical Exam Constitutional:      Appearance: She is well-developed.  HENT:     Head: Normocephalic and atraumatic.  Cardiovascular:     Rate and Rhythm: Normal rate and regular rhythm.     Comments: Port in place right chest wall Pulmonary:     Effort: Pulmonary effort is normal. No respiratory distress.     Breath sounds: Normal breath sounds. No wheezing or rales.  Abdominal:     General: Bowel sounds are normal. There is no distension.     Palpations: Abdomen is soft.     Tenderness: There is no abdominal tenderness. There is no rebound.  Musculoskeletal:     Cervical back: Normal range of motion.  Skin:    General: Skin is warm and dry.  Neurological:     Mental Status: She is alert and oriented to person, place, and time.     Coordination: Coordination normal.      Vitals:   08/14/19 0855  BP: 122/78  Pulse: 81  Temp: 99 F (37.2 C)  TempSrc: Oral  SpO2: 99%  Weight: 197 lb (89.4 kg)  Height: 5\' 7"  (1.702 m)    This visit occurred during the SARS-CoV-2 public health emergency.  Safety protocols were in place, including screening questions prior to the visit, additional usage of staff PPE, and extensive cleaning of exam room while observing appropriate contact time as indicated for disinfecting solutions.   Assessment & Plan:

## 2019-08-14 NOTE — Assessment & Plan Note (Signed)
Off meds currently and BP stable.

## 2019-08-14 NOTE — Assessment & Plan Note (Signed)
Stable mostly using tylenol and heating pad. Advised she can use lidocaine only salon pas otc as well safely. She understands no NSAIDS given kidney transplant.

## 2019-08-14 NOTE — Assessment & Plan Note (Signed)
Refill xanax and using sparingly.

## 2019-08-14 NOTE — Assessment & Plan Note (Signed)
Resolved at this time. Due to multiple abdominal surgeries we talked about risk for recurrence and management.

## 2019-08-14 NOTE — Assessment & Plan Note (Signed)
Following with renal and transplant team and numbers stable.

## 2019-08-20 ENCOUNTER — Ambulatory Visit (INDEPENDENT_AMBULATORY_CARE_PROVIDER_SITE_OTHER): Payer: Medicare Other

## 2019-08-20 DIAGNOSIS — Z4822 Encounter for aftercare following kidney transplant: Secondary | ICD-10-CM | POA: Diagnosis not present

## 2019-08-20 DIAGNOSIS — Z0000 Encounter for general adult medical examination without abnormal findings: Secondary | ICD-10-CM

## 2019-08-20 DIAGNOSIS — Z452 Encounter for adjustment and management of vascular access device: Secondary | ICD-10-CM | POA: Diagnosis not present

## 2019-08-20 NOTE — Patient Instructions (Addendum)
[name] , Thank you for taking time to come for your Medicare Wellness Visit. I appreciate your ongoing commitment to your health goals. Please review the following plan we discussed and let me know if I can assist you in the future.   Screening recommendations/referrals: Colonoscopy: 04/22/2013; due every 10 years  Mammogram: 03/14/2019; due every year Bone Density: never done Recommended yearly ophthalmology/optometry visit for glaucoma screening and checkup Recommended yearly dental visit for hygiene and checkup  Vaccinations: Influenza vaccine: declined Pneumococcal vaccine: declined Tdap vaccine: 11/21/2014 Shingles vaccine: declined  Covid-19: completed  Advanced directives: Please bring a copy of your health care power of attorney and living will to the office at your convenience.  Conditions/risks identified: Please continue to do your personal lifestyle choices by: daily care of teeth and gums, regular physical activity (goal should be 5 days a week for 30 minutes), eat a healthy diet, avoid tobacco and drug use, limiting any alcohol intake, taking a low-dose aspirin (if not allergic or have been advised by your provider otherwise) and taking vitamins and minerals as recommended by your provider. Continue doing brain stimulating activities (puzzles, reading, adult coloring books, staying active) to keep memory sharp. Continue to eat heart healthy diet (full of fruits, vegetables, whole grains, lean protein, water--limit salt, fat, and sugar intake) and increase physical activity as tolerated.  Next appointment: Please schedule your next Medicare Wellness Visit with your Nurse Health Advisor in 1 year.  Preventive Care 40-64 Years, Female Preventive care refers to lifestyle choices and visits with your health care provider that can promote health and wellness. What does preventive care include?  A yearly physical exam. This is also called an annual well check.  Dental exams once or  twice a year.  Routine eye exams. Ask your health care provider how often you should have your eyes checked.  Personal lifestyle choices, including:  Daily care of your teeth and gums.  Regular physical activity.  Eating a healthy diet.  Avoiding tobacco and drug use.  Limiting alcohol use.  Practicing safe sex.  Taking low-dose aspirin daily starting at age 42.  Taking vitamin and mineral supplements as recommended by your health care provider. What happens during an annual well check? The services and screenings done by your health care provider during your annual well check will depend on your age, overall health, lifestyle risk factors, and family history of disease. Counseling  Your health care provider may ask you questions about your:  Alcohol use.  Tobacco use.  Drug use.  Emotional well-being.  Home and relationship well-being.  Sexual activity.  Eating habits.  Work and work Statistician.  Method of birth control.  Menstrual cycle.  Pregnancy history. Screening  You may have the following tests or measurements:  Height, weight, and BMI.  Blood pressure.  Lipid and cholesterol levels. These may be checked every 5 years, or more frequently if you are over 6 years old.  Skin check.  Lung cancer screening. You may have this screening every year starting at age 40 if you have a 30-pack-year history of smoking and currently smoke or have quit within the past 15 years.  Fecal occult blood test (FOBT) of the stool. You may have this test every year starting at age 6.  Flexible sigmoidoscopy or colonoscopy. You may have a sigmoidoscopy every 5 years or a colonoscopy every 10 years starting at age 17.  Hepatitis C blood test.  Hepatitis B blood test.  Sexually transmitted disease (STD) testing.  Diabetes screening. This is done by checking your blood sugar (glucose) after you have not eaten for a while (fasting). You may have this done every 1-3  years.  Mammogram. This may be done every 1-2 years. Talk to your health care provider about when you should start having regular mammograms. This may depend on whether you have a family history of breast cancer.  BRCA-related cancer screening. This may be done if you have a family history of breast, ovarian, tubal, or peritoneal cancers.  Pelvic exam and Pap test. This may be done every 3 years starting at age 17. Starting at age 110, this may be done every 5 years if you have a Pap test in combination with an HPV test.  Bone density scan. This is done to screen for osteoporosis. You may have this scan if you are at high risk for osteoporosis. Discuss your test results, treatment options, and if necessary, the need for more tests with your health care provider. Vaccines  Your health care provider may recommend certain vaccines, such as:  Influenza vaccine. This is recommended every year.  Tetanus, diphtheria, and acellular pertussis (Tdap, Td) vaccine. You may need a Td booster every 10 years.  Zoster vaccine. You may need this after age 38.  Pneumococcal 13-valent conjugate (PCV13) vaccine. You may need this if you have certain conditions and were not previously vaccinated.  Pneumococcal polysaccharide (PPSV23) vaccine. You may need one or two doses if you smoke cigarettes or if you have certain conditions. Talk to your health care provider about which screenings and vaccines you need and how often you need them. This information is not intended to replace advice given to you by your health care provider. Make sure you discuss any questions you have with your health care provider. Document Released: 01/30/2015 Document Revised: 09/23/2015 Document Reviewed: 11/04/2014 Elsevier Interactive Patient Education  2017 [place] Prevention in the Home Falls can cause injuries. They can happen to people of all ages. There are many things you can do to make your home safe and to  help prevent falls. What can I do on the outside of my home?  Regularly fix the edges of walkways and driveways and fix any cracks.  Remove anything that might make you trip as you walk through a door, such as a raised step or threshold.  Trim any bushes or trees on the path to your home.  Use bright outdoor lighting.  Clear any walking paths of anything that might make someone trip, such as rocks or tools.  Regularly check to see if handrails are loose or broken. Make sure that both sides of any steps have handrails.  Any raised decks and porches should have guardrails on the edges.  Have any leaves, snow, or ice cleared regularly.  Use sand or salt on walking paths during winter.  Clean up any spills in your garage right away. This includes oil or grease spills. What can I do in the bathroom?  Use night lights.  Install grab bars by the toilet and in the tub and shower. Do not use towel bars as grab bars.  Use non-skid mats or decals in the tub or shower.  If you need to sit down in the shower, use a plastic, non-slip stool.  Keep the floor dry. Clean up any water that spills on the floor as soon as it happens.  Remove soap buildup in the tub or shower regularly.  Attach bath mats securely  with double-sided non-slip rug tape.  Do not have throw rugs and other things on the floor that can make you trip. What can I do in the bedroom?  Use night lights.  Make sure that you have a light by your bed that is easy to reach.  Do not use any sheets or blankets that are too big for your bed. They should not hang down onto the floor.  Have a firm chair that has side arms. You can use this for support while you get dressed.  Do not have throw rugs and other things on the floor that can make you trip. What can I do in the kitchen?  Clean up any spills right away.  Avoid walking on wet floors.  Keep items that you use a lot in easy-to-reach places.  If you need to reach  something above you, use a strong step stool that has a grab bar.  Keep electrical cords out of the way.  Do not use floor polish or wax that makes floors slippery. If you must use wax, use non-skid floor wax.  Do not have throw rugs and other things on the floor that can make you trip. What can I do with my stairs?  Do not leave any items on the stairs.  Make sure that there are handrails on both sides of the stairs and use them. Fix handrails that are broken or loose. Make sure that handrails are as long as the stairways.  Check any carpeting to make sure that it is firmly attached to the stairs. Fix any carpet that is loose or worn.  Avoid having throw rugs at the top or bottom of the stairs. If you do have throw rugs, attach them to the floor with carpet tape.  Make sure that you have a light switch at the top of the stairs and the bottom of the stairs. If you do not have them, ask someone to add them for you. What else can I do to help prevent falls?  Wear shoes that:  Do not have high heels.  Have rubber bottoms.  Are comfortable and fit you well.  Are closed at the toe. Do not wear sandals.  If you use a stepladder:  Make sure that it is fully opened. Do not climb a closed stepladder.  Make sure that both sides of the stepladder are locked into place.  Ask someone to hold it for you, if possible.  Clearly mark and make sure that you can see:  Any grab bars or handrails.  First and last steps.  Where the edge of each step is.  Use tools that help you move around (mobility aids) if they are needed. These include:  Canes.  Walkers.  Scooters.  Crutches.  Turn on the lights when you go into a dark area. Replace any light bulbs as soon as they burn out.  Set up your furniture so you have a clear path. Avoid moving your furniture around.  If any of your floors are uneven, fix them.  If there are any pets around you, be aware of where they are.  Review  your medicines with your doctor. Some medicines can make you feel dizzy. This can increase your chance of falling. Ask your doctor what other things that you can do to help prevent falls. This information is not intended to replace advice given to you by your health care provider. Make sure you discuss any questions you have with your health care  provider. Document Released: 10/30/2008 Document Revised: 06/11/2015 Document Reviewed: 02/07/2014 Elsevier Interactive Patient Education  2017 [employer].

## 2019-08-20 NOTE — Progress Notes (Signed)
I connected with [name] today by telephone and verified that I am speaking with the correct person using two identifiers. Location patient: home Location provider: work Persons participating in the virtual visit: [name] and Margaret Abu, LPN  I discussed the limitations, risks, security and privacy concerns of performing an evaluation and management service by telephone and the availability of in person appointments. I also discussed with the patient that there may be a patient responsible charge related to this service. The patient expressed understanding and verbally consented to this telephonic visit.    Interactive audio and video telecommunications were attempted between this provider and patient, however failed, due to patient having technical difficulties OR patient did not have access to video capability.  We continued and completed visit with audio only.  Some vital signs may be absent or patient reported.   Time Spent with patient on telephone encounter: 20 minutes  Subjective:   [name] is a 59 y.o. female who presents for Medicare Annual (Subsequent) preventive examination.  Review of Systems    No ROS. Medicare Wellness Virtual Visit. Additional risk factors are reflected in social history. Cardiac Risk Factors include: advanced age (>37men, >46 women);hypertension;obesity (BMI >30kg/m2)     Objective:    Today's Vitals   08/20/19 1446  PainSc: 0-No pain   There is no height or weight on file to calculate BMI.  Advanced Directives 08/20/2019 08/07/2019 08/03/2018 09/30/2017 08/21/2017 08/11/2017 07/24/2017  Does Patient Have a Medical Advance Directive? Yes No No Yes Yes Yes Yes  Type of Advance Directive Living will - - Healthcare Power of [place];Living will [place];Living will [place];Living will  Does patient want to make changes to medical advance directive? - - Yes  (MAU/Ambulatory/Procedural Areas - Information given) No - Patient declined No - Patient declined - -  Copy of [place] in Chart? - - - No - copy requested No - copy requested No - copy requested No - copy requested  Would patient like information on creating a medical advance directive? - No - Patient declined - - - - -  Pre-existing out of facility DNR order (yellow form or pink MOST form) - - - - - - -    Current Medications (verified) Outpatient Encounter Medications as of 08/20/2019  Medication Sig  . acetaminophen (TYLENOL) 325 MG tablet Take 2 tablets (650 mg total) by mouth every 6 (six) hours as needed for mild pain (or Fever >/= 101).  Marland Kitchen albuterol (PROVENTIL) (2.5 MG/3ML) 0.083% nebulizer solution Take 3 mLs (2.5 mg total) by nebulization every 2 (two) hours as needed for wheezing. (Patient not taking: Reported on 08/14/2019)  . ALPRAZolam (XANAX) 0.25 MG tablet Take 1 tablet (0.25 mg total) by mouth daily as needed for anxiety.  Marland Kitchen aspirin 81 MG chewable tablet Chew 81 mg by mouth daily.  Marland Kitchen b complex-vitamin c-folic acid (NEPHRO-VITE) 0.8 MG TABS tablet Take 1 tablet by mouth daily.  Marland Kitchen gabapentin (NEURONTIN) 300 MG capsule Take 2 capsules (600 mg total) by mouth 2 (two) times daily as needed (nerve pain).  . mycophenolate 500 mg in dextrose 5 % 70 mL Inject 500 mg into the vein every 12 (twelve) hours. (Patient not taking: Reported on 08/14/2019)  . predniSONE (DELTASONE) 2.5 MG tablet Take 2.5 mg by mouth every morning. Take with 5mg  qd to =7.5mg  qd total  . predniSONE (DELTASONE) 5 MG tablet Take 5 mg by mouth  daily. Take with 2.5mg  to = 7.5mg  qd total  . rosuvastatin (CRESTOR) 5 MG tablet Take 5 mg by mouth daily.  . sodium bicarbonate 650 MG tablet Take 650 mg by mouth daily.   Marland Kitchen sulfamethoxazole-trimethoprim (BACTRIM,SEPTRA) 400-80 MG tablet Take 1 tablet by mouth every Monday, Wednesday, and Friday.  . tacrolimus (PROGRAF) 1 mg/mL SUSP Take 2.5 mLs (2.5 mg total)  by mouth 2 (two) times daily.   No facility-administered encounter medications on file as of 08/20/2019.    Allergies (verified) Contrast media [iodinated diagnostic agents], Nsaids, Penicillins, Ace inhibitors, and Codeine   History: Past Medical History:  Diagnosis Date  . Acute blood loss anemia   . Anemia   . Anxiety   . Arthritis   . Claustrophobia   . Diverticulosis   . ESRD (end stage renal disease) on dialysis ([facility])    "TTS; [place]" (03/24/2015)  . GI bleed   . Glomerulonephritis   . Heart murmur   . HTN (hypertension)   . Hypothyroidism   . Kidney transplant recipient 01/21/2017  . Pancreatitis   . Renal insufficiency   . SBO (small bowel obstruction) ([facility]) 08/07/2019  . Secondary hyperparathyroidism ([facility])    [name] 05/11/2014   Past Surgical History:  Procedure Laterality Date  . ANTERIOR CERVICAL CORPECTOMY N/A 05/24/2017   Procedure: CORPECTOMY CERVICAL FIVE- CERVICAL SIX;  Surgeon: Ashok Pall, MD;  Location: [facility];  Service: Neurosurgery;  Laterality: N/A;  . ARTERIOVENOUS GRAFT PLACEMENT Right 1990's?  . ARTERIOVENOUS GRAFT PLACEMENT Left 07/2002   upper arm/notes 06/02/2010  . ARTERIOVENOUS GRAFT PLACEMENT Right 07/2003   upper arm/notes 06/02/2010  . AV FISTULA PLACEMENT Left 09/2000   upper arm/notes 06/02/2010  . BREAST BIOPSY Left unsure   benign  . COLONOSCOPY    . DILATATION & CURRETTAGE/HYSTEROSCOPY WITH RESECTOCOPE N/A 04/17/2013   Procedure: DILATATION & CURETTAGE/HYSTEROSCOPY WITH RESECTOCOPE;  Surgeon: Marvene Staff, MD;  Location: [facility];  Service: Gynecology;  Laterality: N/A;  . DILATION AND CURETTAGE OF UTERUS    . EYE SURGERY    . KIDNEY TRANSPLANT  1997  . KIDNEY TRANSPLANT  01/21/2017   Dr. Harrington Challenger  . PARATHYROIDECTOMY  03/2000 05/12/2014   w/neck exploration & autotransplantation/notes 06/02/2010; w/neck exploration  . PARATHYROIDECTOMY N/A 05/12/2014   Procedure: PARATHYROIDECTOMY AND NECK EXPLORATION;  Surgeon: Armandina Gemma, MD;  Location: [facility];  Service: General;  Laterality: N/A;  . PERITONEAL CATHETER INSERTION    . PERITONEAL CATHETER REMOVAL  08/1999   [name] 06/02/2010  . POSTERIOR CERVICAL FUSION/FORAMINOTOMY N/A 05/30/2017   Procedure: POSTERIOR CERVICAL Arthrodesis Cervical three - four, cervical four - five, cervical five - six, cervical six - seven;  Surgeon: Ashok Pall, MD;  Location: [place];  Service: Neurosurgery;  Laterality: N/A;  . POSTERIOR CERVICAL LAMINECTOMY N/A 08/12/2016   Procedure: POSTERIOR CERVICAL LAMINECTOMY CERVICAL THREE CERVICAL FOUR, CERVICAL FOUR CERVICAL FIVE, CERVICAL FIVE- CERVICAL SIX, CERVICAL SIX- CERVICAL SEVEN;  Surgeon: Ashok Pall, MD;  Location: [facility];  Service: Neurosurgery;  Laterality: N/A;  POSTERIOR  . RETINAL DETACHMENT SURGERY Left   . THROMBECTOMY Left 02/2002   fistula/notes 06/02/2010  . THROMBECTOMY / ARTERIOVENOUS GRAFT REVISION  11/2003; 01/2004; 08/07/2005; 08/10/2005; 10/2005   [name] 06/02/2010  . THROMBECTOMY AND REVISION OF ARTERIOVENTOUS (AV) GORETEX  GRAFT Right 01/2002; 11/2003; 01/2004; 08/07/2004; 08/10/2004; 11/13/2005   [name] 5/1/2012Marland Kitchen [name] 5/16/2012Marland Kitchen [name] 5/16/2012Marland Kitchen [name] 5/16/2012Marland Kitchen [name] 06/02/2010; [name] 06/02/2010  . THROMBECTOMY AND REVISION OF ARTERIOVENTOUS (AV) GORETEX  GRAFT Left 08/23/2002;  09/13/2002; 07/08/2003   [name] 5/16/2012Marland Kitchen [name] 06/02/2010; [name] 06/02/2010   Family History  Adopted: Yes  Problem Relation Age of Onset  . Diabetes Maternal Aunt        x2  . Diabetes Maternal Uncle    Social History   Socioeconomic History  . Marital status: Divorced    Spouse name: Not on file  . Number of children: 1  . Years of education: Not on file  . Highest education level: Not on file  Occupational History  . Occupation: disability  Tobacco Use  . Smoking status: Former Smoker    Packs/day: 0.12    Years: 30.00    Pack years: 3.60    Types: Cigarettes    Quit date: 01/21/2017    Years since quitting: 2.5  . Smokeless  tobacco: Never Used  Vaping Use  . Vaping Use: Never used  Substance and Sexual Activity  . Alcohol use: No  . Drug use: No  . Sexual activity: Not Currently  Other Topics Concern  . Not on file  Social History Narrative  . Not on file   Social Determinants of Health   Financial Resource Strain: Low Risk   . Difficulty of Paying Living Expenses: Not hard at all  Food Insecurity: No Food Insecurity  . Worried About Charity fundraiser in the Last Year: Never true  . Ran Out of Food in the Last Year: Never true  Transportation Needs: No Transportation Needs  . Lack of Transportation (Medical): No  . Lack of Transportation (Non-Medical): No  Physical Activity: Sufficiently Active  . Days of Exercise per Week: 3 days  . Minutes of Exercise per Session: 60 min  Stress: No Stress Concern Present  . Feeling of Stress : Not at all  Social Connections: Moderately Integrated  . Frequency of Communication with Friends and Family: More than three times a week  . Frequency of Social Gatherings with Friends and Family: Once a week  . Attends Religious Services: More than 4 times per year  . Active Member of Clubs or Organizations: Yes  . Attends Archivist Meetings: More than 4 times per year  . Marital Status: Never married    Tobacco Counseling Counseling given: Not Answered   Clinical Intake:  Pre-visit preparation completed: Yes  Pain : No/denies pain Pain Score: 0-No pain     Nutritional Risks: None Diabetes: No  How often do you need to have someone help you when you read instructions, pamphlets, or other written materials from your doctor or pharmacy?: 1 - Never What is the last grade level you completed in school?: Bachelor's Degree  Diabetic? no  Interpreter Needed?: No  Information entered by :: Kasy Iannacone N. Skylynne Schlechter, LPN   Activities of Daily Living In your present state of health, do you have any difficulty performing the following activities:  08/20/2019 08/07/2019  Hearing? N N  Vision? N N  Difficulty concentrating or making decisions? N N  Walking or climbing stairs? N N  Dressing or bathing? N N  Doing errands, shopping? N N  Preparing Food and eating ? N -  Using the Toilet? N -  In the past six months, have you accidently leaked urine? N -  Do you have problems with loss of bowel control? N -  Managing your Medications? N -  Managing your Finances? N -  Housekeeping or managing your Housekeeping? N -  Some recent data might be hidden    Patient Care Team:  Hoyt Koch, MD as PCP - General (Internal Medicine)  Indicate any recent Medical Services you may have received from other than [facility] providers in the past year (date may be approximate).     Assessment:   This is a routine wellness examination for [place].  Hearing/Vision screen No exam data present  Dietary issues and exercise activities discussed: Current Exercise Habits: Home exercise routine, Type of exercise: walking, Time (Minutes): 60, Frequency (Times/Week): 3, Weekly Exercise (Minutes/Week): 180, Intensity: Mild, Exercise limited by: cardiac condition(s)  Goals    . Patient Stated     I want to continue to walk routinely and increase my distance gradually to up to 5 miles.      Depression Screen PHQ 2/9 Scores 08/20/2019 08/03/2018 06/21/2017 06/01/2016  PHQ - 2 Score 0 0 1 0  PHQ- 9 Score - - 3 1    Fall Risk Fall Risk  08/20/2019 08/03/2018 06/21/2017  Falls in the past year? 0 0 No  Number falls in past yr: 0 0 -  Injury with Fall? 0 - -  Risk for fall due to : No Fall Risks - -  Follow up Falls evaluation completed - -    Any stairs in or around the home? No  If so, are there any without handrails? No  Home free of loose throw rugs in walkways, pet beds, electrical cords, etc? Yes  Adequate lighting in your home to reduce risk of falls? Yes   ASSISTIVE DEVICES UTILIZED TO PREVENT FALLS:  Life alert? No  Use of a cane, walker or w/c?  No  Grab bars in the bathroom? Yes  Shower chair or bench in shower? Yes  Elevated toilet seat or a handicapped toilet? No   TIMED UP AND GO:  Was the test performed? No .  Length of time to ambulate 10 feet: 0 sec.   Gait steady and fast without use of assistive device  Cognitive Function: Patient is cogitatively intact.        Immunizations Immunization History  Administered Date(s) Administered  . Influenza,inj,Quad PF,6+ Mos 10/03/2017  . Influenza-Unspecified 10/15/2016  . PFIZER SARS-COV-2 Vaccination 04/14/2019, 05/05/2019  . Tdap 11/21/2014    TDAP status: Up to date Flu Vaccine status: Declined, Education has been provided regarding the importance of this vaccine but patient still declined. Advised may receive this vaccine at local pharmacy or Health Dept. Aware to provide a copy of the vaccination record if obtained from local pharmacy or Health Dept. Verbalized acceptance and understanding. Pneumococcal vaccine status: Declined,  Education has been provided regarding the importance of this vaccine but patient still declined. Advised may receive this vaccine at local pharmacy or Health Dept. Aware to provide a copy of the vaccination record if obtained from local pharmacy or Health Dept. Verbalized acceptance and understanding.  Covid-19 vaccine status: Completed vaccines  Qualifies for Shingles Vaccine? Yes   Zostavax completed No   Shingrix Completed?: No.    Education has been provided regarding the importance of this vaccine. Patient has been advised to call insurance company to determine out of pocket expense if they have not yet received this vaccine. Advised may also receive vaccine at local pharmacy or Health Dept. Verbalized acceptance and understanding.  Screening Tests Health Maintenance  Topic Date Due  . PAP SMEAR-Modifier  01/18/2016  . INFLUENZA VACCINE  08/18/2019  . MAMMOGRAM  03/13/2021  . COLONOSCOPY  04/23/2023  . TETANUS/TDAP  11/20/2024  .  COVID-19 Vaccine  Completed  .  Hepatitis C Screening  Completed  . HIV Screening  Completed    Health Maintenance  Health Maintenance Due  Topic Date Due  . PAP SMEAR-Modifier  01/18/2016  . INFLUENZA VACCINE  08/18/2019    Colorectal cancer screening: Completed 04/22/2013. Repeat every 10 years Mammogram status: Completed 03/14/2019. Repeat every year Bone Density status: Never done  Lung Cancer Screening: (Low Dose CT Chest recommended if Age 87-80 years, 30 pack-year currently smoking OR have quit w/in 15years.) does not qualify.   Lung Cancer Screening Referral: no  Additional Screening:  Hepatitis C Screening: does qualify; Completed yes  Vision Screening: Recommended annual ophthalmology exams for early detection of glaucoma and other disorders of the eye. Is the patient up to date with their annual eye exam?  Yes  Who is the provider or what is the name of the office in which the patient attends annual eye exams? Webb Laws, MD If pt is not established with a provider, would they like to be referred to a provider to establish care? No .   Dental Screening: Recommended annual dental exams for proper oral hygiene  Community Resource Referral / Chronic Care Management: CRR required this visit?  No   CCM required this visit?  No      Plan:     I have personally reviewed and noted the following in the patient's chart:   . Medical and social history . Use of alcohol, tobacco or illicit drugs  . Current medications and supplements . Functional ability and status . Nutritional status . Physical activity . Advanced directives . List of other physicians . Hospitalizations, surgeries, and ER visits in previous 12 months . Vitals . Screenings to include cognitive, depression, and falls . Referrals and appointments  In addition, I have reviewed and discussed with patient certain preventive protocols, quality metrics, and best practice recommendations. A written  personalized care plan for preventive services as well as general preventive health recommendations were provided to patient.     Sheral Flow, LPN   8/0/0349   Nurse Notes:  Patient is cogitatively intact. There were no vitals filed for this visit. There is no height or weight on file to calculate BMI. Patient stated that she has no issues with gait or balance; does not use any assistive devices.

## 2019-09-03 DIAGNOSIS — Z940 Kidney transplant status: Secondary | ICD-10-CM | POA: Diagnosis not present

## 2019-09-03 DIAGNOSIS — Z4822 Encounter for aftercare following kidney transplant: Secondary | ICD-10-CM | POA: Diagnosis not present

## 2019-09-26 DIAGNOSIS — K56609 Unspecified intestinal obstruction, unspecified as to partial versus complete obstruction: Secondary | ICD-10-CM | POA: Diagnosis not present

## 2019-09-26 DIAGNOSIS — D631 Anemia in chronic kidney disease: Secondary | ICD-10-CM | POA: Diagnosis not present

## 2019-09-26 DIAGNOSIS — M4802 Spinal stenosis, cervical region: Secondary | ICD-10-CM | POA: Diagnosis not present

## 2019-09-26 DIAGNOSIS — Z940 Kidney transplant status: Secondary | ICD-10-CM | POA: Diagnosis not present

## 2019-09-26 DIAGNOSIS — Z79899 Other long term (current) drug therapy: Secondary | ICD-10-CM | POA: Diagnosis not present

## 2019-09-26 DIAGNOSIS — I129 Hypertensive chronic kidney disease with stage 1 through stage 4 chronic kidney disease, or unspecified chronic kidney disease: Secondary | ICD-10-CM | POA: Diagnosis not present

## 2019-09-26 DIAGNOSIS — E785 Hyperlipidemia, unspecified: Secondary | ICD-10-CM | POA: Diagnosis not present

## 2019-09-26 DIAGNOSIS — N189 Chronic kidney disease, unspecified: Secondary | ICD-10-CM | POA: Diagnosis not present

## 2019-09-26 DIAGNOSIS — N2581 Secondary hyperparathyroidism of renal origin: Secondary | ICD-10-CM | POA: Diagnosis not present

## 2019-09-26 DIAGNOSIS — T829XXA Unspecified complication of cardiac and vascular prosthetic device, implant and graft, initial encounter: Secondary | ICD-10-CM | POA: Diagnosis not present

## 2019-10-29 DIAGNOSIS — I129 Hypertensive chronic kidney disease with stage 1 through stage 4 chronic kidney disease, or unspecified chronic kidney disease: Secondary | ICD-10-CM | POA: Diagnosis not present

## 2019-10-29 DIAGNOSIS — Z940 Kidney transplant status: Secondary | ICD-10-CM | POA: Diagnosis not present

## 2019-10-31 ENCOUNTER — Ambulatory Visit (INDEPENDENT_AMBULATORY_CARE_PROVIDER_SITE_OTHER): Payer: Medicare Other | Admitting: *Deleted

## 2019-10-31 ENCOUNTER — Other Ambulatory Visit: Payer: Self-pay

## 2019-10-31 DIAGNOSIS — Z23 Encounter for immunization: Secondary | ICD-10-CM

## 2019-11-11 DIAGNOSIS — Z20822 Contact with and (suspected) exposure to covid-19: Secondary | ICD-10-CM | POA: Diagnosis not present

## 2019-11-12 ENCOUNTER — Telehealth (INDEPENDENT_AMBULATORY_CARE_PROVIDER_SITE_OTHER): Payer: Medicare Other | Admitting: Family Medicine

## 2019-11-12 ENCOUNTER — Encounter: Payer: Self-pay | Admitting: Family Medicine

## 2019-11-12 DIAGNOSIS — R0982 Postnasal drip: Secondary | ICD-10-CM

## 2019-11-12 DIAGNOSIS — R197 Diarrhea, unspecified: Secondary | ICD-10-CM | POA: Diagnosis not present

## 2019-11-12 DIAGNOSIS — R059 Cough, unspecified: Secondary | ICD-10-CM | POA: Diagnosis not present

## 2019-11-12 NOTE — Patient Instructions (Signed)
   It was nice to meet you today, and I really hope you are feeling better soon. I help [facility] out with telemedicine visits on Tuesdays and Thursdays and am available for visits on those days. If you have any concerns or questions following this visit please schedule a follow up visit with your Primary Care doctor or seek care at a local urgent care clinic to avoid delays in care.    Seek in person care promptly if your symptoms worsen, new concerns arise or you are not improving with treatment. Call 911 and/or seek emergency care if you symptoms are severe or life threatening.   -stay home while sick, and if you have [place] please stay home for a full 10 days since the onset of symptoms PLUS one day of no fever and feeling better  -schedule a follow up visit right away if your covid test is positive  -can use nasal saline a few times per day if nasal congestion  -can use imodium if any further mild diarrhea  -stay hydrated, drink plenty of fluids and eat small healthy meals - avoid dairy  -follow up with your doctor in 2-3 days unless improving and feeling better

## 2019-11-12 NOTE — Progress Notes (Signed)
Virtual Visit via Telephone Note  I connected with [name] on 11/12/19 at  5:00 PM EDT by telephone and verified that I am speaking with the correct person using two identifiers.   I discussed the limitations, risks, security and privacy concerns of performing an evaluation and management service by telephone and the availability of in person appointments. I also discussed with the patient that there may be a patient responsible charge related to this service. The patient expressed understanding and agreed to proceed.  Location patient: home, [place] Location provider: work or home office Participants present for the call: patient, provider Patient did not have a visit in the prior 7 days to address this/these issue(s).   History of Present Illness:  Acute telemedicine visit for diarrhea: -Onset: 2 days ago -Symptoms include: diarrhea twice, cough, scratchy throat, nausea -most symptoms have cleared up today - mild cough occasionally,  -did a covid test yesterday - results pending -she has not been around anyone other than the grocery store - uses double mask -Denies: fever, malaise, SOB, CP, vomiting, persistent diarrhea - none in several days, melena, hematochezia -Has tried:imodium, musinex -Pertinent past medical history: transplant patient -Pertinent medication allergies: -COVID-19 vaccine status: fully vaccinated for covid19 w/ pfizer   Observations/Objective: Patient sounds cheerful and well on the phone. I do not appreciate any SOB. Speech and thought processing are grossly intact. Patient reported vitals:  Assessment and Plan:  Diarrhea, unspecified type  Cough  PND (post-nasal drip)  -we discussed possible serious and likely etiologies, options for evaluation and workup, limitations of telemedicine visit vs in person visit, treatment, treatment risks and precautions. Pt prefers to treat via telemedicine empirically rather than in person at this moment.  Mild viral  illness seems most likely, versus other.  Her diarrhea has resolved for several days.  Reports she actually feels well today other than occasional mild cough.  Covid testing is pending. She opted for symptomatic care with imodium if needed, nasal saline and Mucinex for the cough.  Advised that she follow-up with her primary care doctor in person or with an urgent care clinic if Covid testing is positive, and if symptoms are worsening, new symptoms or if her symptoms do not continue to improve over the next several days. Work/School slipped offered:  declined Scheduled follow up with PCP offered: Declined  -she agrees to call if needed. Advised to seek prompt in person care if worsening, new symptoms arise, or if is not improving with treatment. Advised of options for inperson care in case PCP office not available. Did let the patient know that I only do telemedicine shifts for [facility] on Tuesdays and Thursdays and advised a follow up visit with PCP or at an [facility] if has further questions or concerns.   Follow Up Instructions:  I did not refer this patient for an OV in the next 24 hours for this/these issue(s).  I discussed the assessment and treatment plan with the patient. The patient was provided an opportunity to ask questions and all were answered. The patient agreed with the plan and demonstrated an understanding of the instructions.   Spent at least 18 minutes on this encounter.   Lucretia Kern, DO

## 2019-11-21 DIAGNOSIS — Z23 Encounter for immunization: Secondary | ICD-10-CM | POA: Diagnosis not present

## 2019-12-02 DIAGNOSIS — Z940 Kidney transplant status: Secondary | ICD-10-CM | POA: Diagnosis not present

## 2019-12-26 ENCOUNTER — Telehealth: Payer: Self-pay | Admitting: Internal Medicine

## 2019-12-26 ENCOUNTER — Other Ambulatory Visit: Payer: Self-pay | Admitting: Internal Medicine

## 2019-12-26 NOTE — Telephone Encounter (Signed)
Pharmacy called and said that they faxed over a refill request for gabapentin (NEURONTIN) 300 MG capsule Three days ago. They are wanting to send the medication out today to the patient. It can be sent to [facility], [place] [address]

## 2019-12-31 DIAGNOSIS — Z940 Kidney transplant status: Secondary | ICD-10-CM | POA: Diagnosis not present

## 2020-01-02 DIAGNOSIS — I129 Hypertensive chronic kidney disease with stage 1 through stage 4 chronic kidney disease, or unspecified chronic kidney disease: Secondary | ICD-10-CM | POA: Diagnosis not present

## 2020-01-02 DIAGNOSIS — Z940 Kidney transplant status: Secondary | ICD-10-CM | POA: Diagnosis not present

## 2020-01-02 DIAGNOSIS — N2581 Secondary hyperparathyroidism of renal origin: Secondary | ICD-10-CM | POA: Diagnosis not present

## 2020-01-02 DIAGNOSIS — T829XXA Unspecified complication of cardiac and vascular prosthetic device, implant and graft, initial encounter: Secondary | ICD-10-CM | POA: Diagnosis not present

## 2020-01-02 DIAGNOSIS — K56609 Unspecified intestinal obstruction, unspecified as to partial versus complete obstruction: Secondary | ICD-10-CM | POA: Diagnosis not present

## 2020-01-02 DIAGNOSIS — E785 Hyperlipidemia, unspecified: Secondary | ICD-10-CM | POA: Diagnosis not present

## 2020-01-02 DIAGNOSIS — R002 Palpitations: Secondary | ICD-10-CM | POA: Diagnosis not present

## 2020-01-02 DIAGNOSIS — Z79899 Other long term (current) drug therapy: Secondary | ICD-10-CM | POA: Diagnosis not present

## 2020-01-02 DIAGNOSIS — D631 Anemia in chronic kidney disease: Secondary | ICD-10-CM | POA: Diagnosis not present

## 2020-01-02 DIAGNOSIS — N189 Chronic kidney disease, unspecified: Secondary | ICD-10-CM | POA: Diagnosis not present

## 2020-01-02 DIAGNOSIS — M4802 Spinal stenosis, cervical region: Secondary | ICD-10-CM | POA: Diagnosis not present

## 2020-01-03 DIAGNOSIS — Z79899 Other long term (current) drug therapy: Secondary | ICD-10-CM | POA: Diagnosis not present

## 2020-01-21 NOTE — Telephone Encounter (Signed)
It looks like gabapentin was refilled on 12/26/19, does this request still need to be addressed?

## 2020-01-27 ENCOUNTER — Ambulatory Visit (INDEPENDENT_AMBULATORY_CARE_PROVIDER_SITE_OTHER): Payer: Medicare Other | Admitting: Cardiology

## 2020-01-27 ENCOUNTER — Other Ambulatory Visit: Payer: Self-pay

## 2020-01-27 ENCOUNTER — Encounter: Payer: Self-pay | Admitting: Cardiology

## 2020-01-27 VITALS — BP 110/56 | HR 92 | Ht 67.0 in | Wt 193.0 lb

## 2020-01-27 DIAGNOSIS — R002 Palpitations: Secondary | ICD-10-CM

## 2020-01-27 DIAGNOSIS — R011 Cardiac murmur, unspecified: Secondary | ICD-10-CM | POA: Diagnosis not present

## 2020-01-27 DIAGNOSIS — I700 Atherosclerosis of aorta: Secondary | ICD-10-CM

## 2020-01-27 DIAGNOSIS — I491 Atrial premature depolarization: Secondary | ICD-10-CM | POA: Diagnosis not present

## 2020-01-27 DIAGNOSIS — Z7189 Other specified counseling: Secondary | ICD-10-CM | POA: Diagnosis not present

## 2020-01-27 NOTE — Patient Instructions (Signed)
Medication Instructions:  No Changes *If you need a refill on your cardiac medications before your next appointment, please call your pharmacy*   Lab Work: No Labs If you have labs (blood work) drawn today and your tests are completely normal, you will receive your results only by: Marland Kitchen MyChart Message (if you have MyChart) OR . A paper copy in the mail If you have any lab test that is abnormal or we need to change your treatment, we will call you to review the results.   Testing/Procedures: Bryn Gulling- Long Term Monitor Instructions   Your physician has requested you wear your ZIO patch monitor___7____days.   This is a single patch monitor.  Irhythm supplies one patch monitor per enrollment.  Additional stickers are not available.   Please do not apply patch if you will be having a Nuclear Stress Test, Echocardiogram, Cardiac CT, MRI, or Chest Xray during the time frame you would be wearing the monitor. The patch cannot be worn during these tests.  You cannot remove and re-apply the ZIO XT patch monitor.   Your ZIO patch monitor will be sent USPS Priority mail from [facility] directly to your home address. The monitor may also be mailed to a PO BOX if home delivery is not available.   It may take 3-5 days to receive your monitor after you have been enrolled.   Once you have received you monitor, please review enclosed instructions.  Your monitor has already been registered assigning a specific monitor serial # to you.   Applying the monitor   Shave hair from upper left chest.   Hold abrader disc by orange tab.  Rub abrader in 40 strokes over left upper chest as indicated in your monitor instructions.   Clean area with 4 enclosed alcohol pads .  Use all pads to assure are is cleaned thoroughly.  Let dry.   Apply patch as indicated in monitor instructions.  Patch will be place under collarbone on left side of chest with arrow pointing upward.   Rub patch adhesive wings for 2  minutes.Remove white label marked "1".  Remove white label marked "2".  Rub patch adhesive wings for 2 additional minutes.   While looking in a mirror, press and release button in center of patch.  A small green light will flash 3-4 times .  This will be your only indicator the monitor has been turned on.     Do not shower for the first 24 hours.  You may shower after the first 24 hours.   Press button if you feel a symptom. You will hear a small click.  Record Date, Time and Symptom in the Patient Log Book.   When you are ready to remove patch, follow instructions on last 2 pages of Patient Log Book.  Stick patch monitor onto last page of Patient Log Book.   Place Patient Log Book in [place] box.  Use locking tab on box and tape box closed securely.  The [employer] and [employer] has [employer] on it.  Please place in mailbox as soon as possible.  Your physician should have your test results approximately 7 days after the monitor has been mailed back to [facility].   Call [place] at [phone] if you have questions regarding your ZIO XT patch monitor.  Call them immediately if you see an orange light blinking on your monitor.   If your monitor falls off in less than 4 days contact our Monitor department  at [phone].  If your monitor becomes loose or falls off after 4 days call [employer] at [phone] for suggestions on securing your monitor.     Follow-Up: At [facility], you and your health needs are our priority.  As part of our continuing mission to provide you with exceptional heart care, we have created designated Provider Care Teams.  These Care Teams include your primary Cardiologist (physician) and Advanced Practice Providers (APPs -  Physician Assistants and Nurse Practitioners) who all work together to provide you with the care you need, when you need it.  We recommend signing up for the patient portal called "MyChart".  Sign up information is  provided on this After Visit Summary.  MyChart is used to connect with patients for Virtual Visits (Telemedicine).  Patients are able to view lab/test results, encounter notes, upcoming appointments, etc.  Non-urgent messages can be sent to your provider as well.   To learn more about what you can do with MyChart, go to [email or web address].    Your next appointment:   3 month(s)  The format for your next appointment:   In Person  Provider:   Buford Dresser, MD

## 2020-01-27 NOTE — Progress Notes (Signed)
Cardiology Office Note:    Date:  01/27/2020   ID:  [name], DOB [birth year], MRN [id number]  PCP:  Hoyt Koch, MD  Cardiologist:  Buford Dresser, MD  Referring MD: Donato Heinz, MD   Chief Complaint  Patient presents with  . New Patient (Initial Visit)    History of Present Illness:    [name] is a 60 y.o. female with a hx of kidney transplant who is seen as a new consult at the request of Donato Heinz, MD for the evaluation and management of palpitations.  Note from Dr. Arty Baumgartner dated 01/07/20 reviewed. History notable for S/P kidney transplant 2019. History of diverticulitis/diverticulosis, SBO. Reported to have 1 month of palpitation symptoms at that visit.   Tachycardia/palpitations: -Initial onset: Started around Thanksgiving. Reports feelings like racing, can sometimes see her heart beating under her shirt.  -Frequency/Duration: has become longer lasting, up to a couple minutes at a time. Happens several times/week -Associated symptoms: none -Aggravating/alleviating factors: no clear pattern. Better if she coughs/stays stills.  -Syncope/near syncope: none -Prior cardiac history: been told she has a murmur -Prior workup: had monitor in 2015, didn't see anything abnormal -Prior treatment: none -Possible medication interactions: reviewed -Caffeine: cut back on caffeine without any improvement -Alcohol: none -Tobacco: former, quit day of transplant -OTC supplements: none other than listed -Comorbidities: prior renal transplant -Exercise level: no limitations, can do everything she needs to do at home. -Labs: TSH, kidney function/electrolytes, CBC reviewed. -Cardiac ROS: no chest pain, no shortness of breath, no PND, no orthopnea, no LE edema. -Family history: no heart disease that she knows of  In Care Everywhere: -Zio monitor 03/05/17: I cannot see results -Echo 01/13/2017: SUMMARY  The left ventricular size is normal. There is  normal left ventricular wall  thickness.   Left ventricular systolic function is normal. LV ejection fraction = 55-60%.   Left ventricular filling pattern is prolonged relaxation.  The right ventricle is normal in size and function.  There is mild mitral annular calcification. The mitral valve leaflets appear  thickened, but open well. There is mild mitral regurgitation.  Structurally normal tricuspid valve. There is moderate tricuspid  regurgitation.  The aortic root is normal size.  IVC size was normal.  There is no pericardial effusion.  Probably no significant change in comparison with the prior study noted.   NM myocardial stress 11/24/2014: reported as normal  Past Medical History:  Diagnosis Date  . Acute blood loss anemia   . Anemia   . Anxiety   . Arthritis   . Claustrophobia   . Diverticulosis   . ESRD (end stage renal disease) on dialysis ([place])    "TTS; [place]" (03/24/2015)  . GI bleed   . Glomerulonephritis   . Heart murmur   . HTN (hypertension)   . Hypothyroidism   . Kidney transplant recipient 01/21/2017  . Pancreatitis   . Renal insufficiency   . SBO (small bowel obstruction) ([place]) 08/07/2019  . Secondary hyperparathyroidism ([place])    [name] 05/11/2014    Past Surgical History:  Procedure Laterality Date  . ANTERIOR CERVICAL CORPECTOMY N/A 05/24/2017   Procedure: CORPECTOMY CERVICAL FIVE- CERVICAL SIX;  Surgeon: Ashok Pall, MD;  Location: [place];  Service: Neurosurgery;  Laterality: N/A;  . ARTERIOVENOUS GRAFT PLACEMENT Right 1990's?  . ARTERIOVENOUS GRAFT PLACEMENT Left 07/2002   upper arm/notes 06/02/2010  . ARTERIOVENOUS GRAFT PLACEMENT Right 07/2003   upper arm/notes 06/02/2010  . AV FISTULA PLACEMENT Left 09/2000  upper arm/notes 06/02/2010  . BREAST BIOPSY Left unsure   benign  . COLONOSCOPY    . DILATATION & CURRETTAGE/HYSTEROSCOPY WITH RESECTOCOPE N/A 04/17/2013   Procedure: DILATATION & CURETTAGE/HYSTEROSCOPY WITH RESECTOCOPE;   Surgeon: Marvene Staff, MD;  Location: [facility];  Service: Gynecology;  Laterality: N/A;  . DILATION AND CURETTAGE OF UTERUS    . EYE SURGERY    . KIDNEY TRANSPLANT  1997  . KIDNEY TRANSPLANT  01/21/2017   Dr. Harrington Challenger  . PARATHYROIDECTOMY  03/2000 05/12/2014   w/neck exploration & autotransplantation/notes 06/02/2010; w/neck exploration  . PARATHYROIDECTOMY N/A 05/12/2014   Procedure: PARATHYROIDECTOMY AND NECK EXPLORATION;  Surgeon: Armandina Gemma, MD;  Location: [place];  Service: General;  Laterality: N/A;  . PERITONEAL CATHETER INSERTION    . PERITONEAL CATHETER REMOVAL  08/1999   [name] 06/02/2010  . POSTERIOR CERVICAL FUSION/FORAMINOTOMY N/A 05/30/2017   Procedure: POSTERIOR CERVICAL Arthrodesis Cervical three - four, cervical four - five, cervical five - six, cervical six - seven;  Surgeon: Ashok Pall, MD;  Location: [place];  Service: Neurosurgery;  Laterality: N/A;  . POSTERIOR CERVICAL LAMINECTOMY N/A 08/12/2016   Procedure: POSTERIOR CERVICAL LAMINECTOMY CERVICAL THREE CERVICAL FOUR, CERVICAL FOUR CERVICAL FIVE, CERVICAL FIVE- CERVICAL SIX, CERVICAL SIX- CERVICAL SEVEN;  Surgeon: Ashok Pall, MD;  Location: [place];  Service: Neurosurgery;  Laterality: N/A;  POSTERIOR  . RETINAL DETACHMENT SURGERY Left   . THROMBECTOMY Left 02/2002   fistula/notes 06/02/2010  . THROMBECTOMY / ARTERIOVENOUS GRAFT REVISION  11/2003; 01/2004; 08/07/2005; 08/10/2005; 10/2005   [name] 06/02/2010  . THROMBECTOMY AND REVISION OF ARTERIOVENTOUS (AV) GORETEX  GRAFT Right 01/2002; 11/2003; 01/2004; 08/07/2004; 08/10/2004; 11/13/2005   [name] 5/1/2012Marland Kitchen [name] 5/16/2012Marland Kitchen [name] 5/16/2012Marland Kitchen [name] 5/16/2012Marland Kitchen [name] 06/02/2010; [name] 06/02/2010  . THROMBECTOMY AND REVISION OF ARTERIOVENTOUS (AV) GORETEX  GRAFT Left 08/23/2002; 09/13/2002; 07/08/2003   [name] 06/02/2010; [name] 06/02/2010; [name] 06/02/2010    Current Medications: Current Outpatient Medications on File Prior to Visit  Medication Sig  . acetaminophen (TYLENOL) 325 MG  tablet Take 2 tablets (650 mg total) by mouth every 6 (six) hours as needed for mild pain (or Fever >/= 101).  Marland Kitchen ALPRAZolam (XANAX) 0.25 MG tablet Take 1 tablet (0.25 mg total) by mouth daily as needed for anxiety.  Marland Kitchen aspirin 81 MG chewable tablet Chew 81 mg by mouth daily.  Marland Kitchen b complex-vitamin c-folic acid (NEPHRO-VITE) 0.8 MG TABS tablet Take 1 tablet by mouth daily.  Marland Kitchen gabapentin (NEURONTIN) 300 MG capsule TAKE 2 CAPSULES BY MOUTH 2 TIMES DAILY AS NEEDED FOR NERVE PAIN.  . mycophenolate (MYFORTIC) 180 MG EC tablet Take 180 mg by mouth 2 (two) times daily. TAKE 4 TABLETS TWICE EACH DAY.  . mycophenolate 500 mg in dextrose 5 % 70 mL Inject 500 mg into the vein every 12 (twelve) hours.  . predniSONE (DELTASONE) 2.5 MG tablet Take 2.5 mg by mouth every morning. Take with 5mg  qd to =7.5mg  qd total  . predniSONE (DELTASONE) 5 MG tablet Take 5 mg by mouth daily. Take with 2.5mg  to = 7.5mg  qd total  . rosuvastatin (CRESTOR) 5 MG tablet Take 5 mg by mouth daily.  . sodium bicarbonate 650 MG tablet Take 650 mg by mouth daily.   Marland Kitchen sulfamethoxazole-trimethoprim (BACTRIM,SEPTRA) 400-80 MG tablet Take 1 tablet by mouth every Monday, Wednesday, and Friday.  . tacrolimus (PROGRAF) 1 mg/mL SUSP Take 2.5 mLs (2.5 mg total) by mouth 2 (two) times daily. (Patient taking differently: Take 2.5 mg by mouth 2 (two) times daily. TAKE TWO TABLETS  IN THE MORNING AND ONE TABLET ON THE EVEINNG.)   No current facility-administered medications on file prior to visit.     Allergies:   Contrast media [iodinated diagnostic agents], Nsaids, Penicillins, Ace inhibitors, Codeine, and Regadenoson   Social History   Tobacco Use  . Smoking status: Former Smoker    Packs/day: 0.12    Years: 30.00    Pack years: 3.60    Types: Cigarettes    Quit date: 01/21/2017    Years since quitting: 3.0  . Smokeless tobacco: Never Used  Vaping Use  . Vaping Use: Never used  Substance Use Topics  . Alcohol use: No  . Drug use: No     Family History: family history includes Diabetes in her maternal aunt and maternal uncle. She was adopted.  ROS:   Please see the history of present illness.  Additional pertinent ROS: Constitutional: Negative for chills, fever, night sweats, unintentional weight loss  HENT: Negative for ear pain and hearing loss.   Eyes: Negative for loss of vision and eye pain.  Respiratory: Negative for cough, sputum, wheezing.   Cardiovascular: See HPI. Gastrointestinal: Negative for abdominal pain, melena, and hematochezia.  Genitourinary: Negative for dysuria and hematuria.  Musculoskeletal: Negative for falls and myalgias.  Skin: Negative for itching and rash.  Neurological: Negative for focal weakness, focal sensory changes and loss of consciousness.  Endo/Heme/Allergies: Does not bruise/bleed easily.     EKGs/Labs/Other Studies Reviewed:    The following studies were reviewed today: See summary in HPI  EKG:  EKG is personally reviewed.  The ekg ordered today demonstrates sinus rhythm with frequent and consecutive PACs  Recent Labs: 08/06/2019: ALT 10 08/07/2019: BUN 19; Creatinine, Ser 1.60; Hemoglobin 14.1; Platelets 258; Potassium 5.0; Sodium 136  Recent Lipid Panel No results found for: CHOL, TRIG, HDL, CHOLHDL, VLDL, LDLCALC, LDLDIRECT  Physical Exam:    VS:  BP (!) 110/56 (BP Location: Left Arm, Patient Position: Sitting, Cuff Size: Large)   Pulse 92   Ht 5\' 7"  (1.702 m)   Wt 193 lb (87.5 kg)   BMI 30.23 kg/m     Wt Readings from Last 3 Encounters:  01/27/20 193 lb (87.5 kg)  08/14/19 197 lb (89.4 kg)  08/06/19 201 lb 4.5 oz (91.3 kg)    GEN: Well nourished, well developed in no acute distress HEENT: Normal, moist mucous membranes NECK: No JVD CARDIAC: regular rhythm with occasional premature beat, normal S1 and S2, no rubs or gallops. 3/6 systolic murmur along sternal border. VASCULAR: Radial and DP pulses 2+ bilaterally. No carotid bruits RESPIRATORY:  Clear to  auscultation without rales, wheezing or rhonchi  ABDOMEN: Soft, non-tender, non-distended MUSCULOSKELETAL:  Ambulates independently SKIN: Warm and dry, no edema NEUROLOGIC:  Alert and oriented x 3. No focal neuro deficits noted. PSYCHIATRIC:  Normal affect     ASSESSMENT:    1. Palpitations   2. PAC (premature atrial contraction)   3. Murmur, cardiac   4. Aortic atherosclerosis (HCC)   5. Cardiac risk counseling   6. Counseling on health promotion and disease prevention    PLAN:    Palpitations, PACs -we discussed the potential causes of fast heart rates and palpitations today. Reviewed the normal electrical system of the heart. Reviewed the role of the sinus node. Reviewed the balance between resting (vagal) tone and fight or flight nervous system input. Reviewed how exercise improves vagal tone and lowers resting heart rate. Reviewed that sinus tachycardia, or elevated sinus rate, is usually secondary to  something else in the body. This can include pain, stress, infection, anxiety, hormone imbalance, low blood counts, etc. Discussed that we do not typically treat sinus tachycardia by itself, and instead the focus is on finding what is driving the heart rate and treating that. We discussed that there can be other rhythm issues, from either the top or bottom of the heart, that are abnormal rhythms. Discussed how we evaluate for these. -will order 1 week Zio monitor for further evaluation -may need to trial beta blocker if significant PACs or SVT noted  Heart murmur -prior echo with moderate TR, murmur is consistent with this  History of renal transplant -per Dr Arty Baumgartner  Aortic atherosclerosis: most recent on CT 08/06/19 -currently on aspirin, statin  Cardiac risk counseling and prevention recommendations: -recommend heart healthy/Mediterranean diet, with whole grains, fruits, vegetable, fish, lean meats, nuts, and olive oil. Limit salt. -recommend moderate walking, 3-5 times/week  for 30-50 minutes each session. Aim for at least 150 minutes.week. Goal should be pace of 3 miles/hours, or walking 1.5 miles in 30 minutes -recommend avoidance of tobacco products. Avoid excess alcohol. -ASCVD risk score: The 10-year ASCVD risk score Mikey Bussing DC Brooke Bonito., et al., 2013) is: 4.4%   Values used to calculate the score:     Age: 39 years     Sex: Female     Is Non-Hispanic African American: Yes     Diabetic: No     Tobacco smoker: No     Systolic Blood Pressure: 235 mmHg     Is BP treated: No     HDL Cholesterol: 49 MG/DL     Total Cholesterol: 275 MG/DL    Plan for follow up: 3 mos or sooner as needed  Buford Dresser, MD, PhD, [facility]    Medication Adjustments/Labs and Tests Ordered: Current medicines are reviewed at length with the patient today.  Concerns regarding medicines are outlined above.  Orders Placed This Encounter  Procedures  . EKG 12-Lead   No orders of the defined types were placed in this encounter.   Patient Instructions  Medication Instructions:  No Changes *If you need a refill on your cardiac medications before your next appointment, please call your pharmacy*   Lab Work: No Labs If you have labs (blood work) drawn today and your tests are completely normal, you will receive your results only by: Marland Kitchen MyChart Message (if you have MyChart) OR . A paper copy in the mail If you have any lab test that is abnormal or we need to change your treatment, we will call you to review the results.   Testing/Procedures: Bryn Gulling- Long Term Monitor Instructions   Your physician has requested you wear your ZIO patch monitor___7____days.   This is a single patch monitor.  [employer] supplies one patch monitor per enrollment.  Additional stickers are not available.   Please do not apply patch if you will be having a Nuclear Stress Test, Echocardiogram, Cardiac CT, MRI, or Chest Xray during the time frame you would be wearing the  monitor. The patch cannot be worn during these tests.  You cannot remove and re-apply the ZIO XT patch monitor.   Your ZIO patch monitor will be sent USPS Priority mail from [facility] directly to your home address. The monitor may also be mailed to a PO BOX if home delivery is not available.   It may take 3-5 days to receive your monitor after you have been enrolled.   Once  you have received you monitor, please review enclosed instructions.  Your monitor has already been registered assigning a specific monitor serial # to you.   Applying the monitor   Shave hair from upper left chest.   Hold abrader disc by orange tab.  Rub abrader in 40 strokes over left upper chest as indicated in your monitor instructions.   Clean area with 4 enclosed alcohol pads .  Use all pads to assure are is cleaned thoroughly.  Let dry.   Apply patch as indicated in monitor instructions.  Patch will be place under collarbone on left side of chest with arrow pointing upward.   Rub patch adhesive wings for 2 minutes.Remove white label marked "1".  Remove white label marked "2".  Rub patch adhesive wings for 2 additional minutes.   While looking in a mirror, press and release button in center of patch.  A small green light will flash 3-4 times .  This will be your only indicator the monitor has been turned on.     Do not shower for the first 24 hours.  You may shower after the first 24 hours.   Press button if you feel a symptom. You will hear a small click.  Record Date, Time and Symptom in the Patient Log Book.   When you are ready to remove patch, follow instructions on last 2 pages of Patient Log Book.  Stick patch monitor onto last page of Patient Log Book.   Place Patient Log Book in [place] box.  Use locking tab on box and tape box closed securely.  The [employer] has [employer] on it.  Please place in mailbox as soon as possible.  Your physician should have your test results  approximately 7 days after the monitor has been mailed back to [facility].   Call [place] at [phone] if you have questions regarding your ZIO XT patch monitor.  Call them immediately if you see an orange light blinking on your monitor.   If your monitor falls off in less than 4 days contact our Monitor department at [phone].  If your monitor becomes loose or falls off after 4 days call [employer] at [phone] for suggestions on securing your monitor.     Follow-Up: At [facility], you and your health needs are our priority.  As part of our continuing mission to provide you with exceptional heart care, we have created designated Provider Care Teams.  These Care Teams include your primary Cardiologist (physician) and Advanced Practice Providers (APPs -  Physician Assistants and Nurse Practitioners) who all work together to provide you with the care you need, when you need it.  We recommend signing up for the patient portal called "MyChart".  Sign up information is provided on this After Visit Summary.  MyChart is used to connect with patients for Virtual Visits (Telemedicine).  Patients are able to view lab/test results, encounter notes, upcoming appointments, etc.  Non-urgent messages can be sent to your provider as well.   To learn more about what you can do with MyChart, go to [email or web address].    Your next appointment:   3 month(s)  The format for your next appointment:   In Person  Provider:   Buford Dresser, MD      Signed, Buford Dresser, MD PhD 01/27/2020 12:46 PM    [place]

## 2020-02-05 ENCOUNTER — Ambulatory Visit (INDEPENDENT_AMBULATORY_CARE_PROVIDER_SITE_OTHER): Payer: Medicare Other

## 2020-02-05 DIAGNOSIS — R002 Palpitations: Secondary | ICD-10-CM

## 2020-02-05 NOTE — Addendum Note (Signed)
Addended by: Meryl Crutch on: 02/05/2020 09:21 AM   Modules accepted: Orders

## 2020-02-07 DIAGNOSIS — R002 Palpitations: Secondary | ICD-10-CM | POA: Diagnosis not present

## 2020-02-11 DIAGNOSIS — Z940 Kidney transplant status: Secondary | ICD-10-CM | POA: Diagnosis not present

## 2020-02-20 DIAGNOSIS — R002 Palpitations: Secondary | ICD-10-CM | POA: Diagnosis not present

## 2020-03-12 DIAGNOSIS — M542 Cervicalgia: Secondary | ICD-10-CM | POA: Diagnosis not present

## 2020-03-12 DIAGNOSIS — Z862 Personal history of diseases of the blood and blood-forming organs and certain disorders involving the immune mechanism: Secondary | ICD-10-CM | POA: Diagnosis not present

## 2020-03-12 DIAGNOSIS — I10 Essential (primary) hypertension: Secondary | ICD-10-CM | POA: Diagnosis not present

## 2020-03-12 DIAGNOSIS — Z4822 Encounter for aftercare following kidney transplant: Secondary | ICD-10-CM | POA: Diagnosis not present

## 2020-03-12 DIAGNOSIS — Z87891 Personal history of nicotine dependence: Secondary | ICD-10-CM | POA: Diagnosis not present

## 2020-03-12 DIAGNOSIS — D849 Immunodeficiency, unspecified: Secondary | ICD-10-CM | POA: Diagnosis not present

## 2020-03-12 DIAGNOSIS — Z940 Kidney transplant status: Secondary | ICD-10-CM | POA: Diagnosis not present

## 2020-03-12 DIAGNOSIS — Z792 Long term (current) use of antibiotics: Secondary | ICD-10-CM | POA: Diagnosis not present

## 2020-03-12 DIAGNOSIS — E872 Acidosis: Secondary | ICD-10-CM | POA: Diagnosis not present

## 2020-03-12 DIAGNOSIS — Z79899 Other long term (current) drug therapy: Secondary | ICD-10-CM | POA: Diagnosis not present

## 2020-03-12 DIAGNOSIS — Z7952 Long term (current) use of systemic steroids: Secondary | ICD-10-CM | POA: Diagnosis not present

## 2020-03-12 DIAGNOSIS — Z8619 Personal history of other infectious and parasitic diseases: Secondary | ICD-10-CM | POA: Diagnosis not present

## 2020-03-12 DIAGNOSIS — Z981 Arthrodesis status: Secondary | ICD-10-CM | POA: Diagnosis not present

## 2020-03-23 ENCOUNTER — Other Ambulatory Visit: Payer: Self-pay | Admitting: Internal Medicine

## 2020-03-30 DIAGNOSIS — D692 Other nonthrombocytopenic purpura: Secondary | ICD-10-CM | POA: Diagnosis not present

## 2020-03-30 DIAGNOSIS — L821 Other seborrheic keratosis: Secondary | ICD-10-CM | POA: Diagnosis not present

## 2020-03-30 DIAGNOSIS — D2271 Melanocytic nevi of right lower limb, including hip: Secondary | ICD-10-CM | POA: Diagnosis not present

## 2020-03-30 DIAGNOSIS — B360 Pityriasis versicolor: Secondary | ICD-10-CM | POA: Diagnosis not present

## 2020-03-30 DIAGNOSIS — L819 Disorder of pigmentation, unspecified: Secondary | ICD-10-CM | POA: Diagnosis not present

## 2020-03-30 DIAGNOSIS — D235 Other benign neoplasm of skin of trunk: Secondary | ICD-10-CM | POA: Diagnosis not present

## 2020-03-31 DIAGNOSIS — Z79899 Other long term (current) drug therapy: Secondary | ICD-10-CM | POA: Diagnosis not present

## 2020-03-31 DIAGNOSIS — T829XXA Unspecified complication of cardiac and vascular prosthetic device, implant and graft, initial encounter: Secondary | ICD-10-CM | POA: Diagnosis not present

## 2020-03-31 DIAGNOSIS — N189 Chronic kidney disease, unspecified: Secondary | ICD-10-CM | POA: Diagnosis not present

## 2020-03-31 DIAGNOSIS — M4802 Spinal stenosis, cervical region: Secondary | ICD-10-CM | POA: Diagnosis not present

## 2020-03-31 DIAGNOSIS — D631 Anemia in chronic kidney disease: Secondary | ICD-10-CM | POA: Diagnosis not present

## 2020-03-31 DIAGNOSIS — I129 Hypertensive chronic kidney disease with stage 1 through stage 4 chronic kidney disease, or unspecified chronic kidney disease: Secondary | ICD-10-CM | POA: Diagnosis not present

## 2020-03-31 DIAGNOSIS — R002 Palpitations: Secondary | ICD-10-CM | POA: Diagnosis not present

## 2020-03-31 DIAGNOSIS — N2581 Secondary hyperparathyroidism of renal origin: Secondary | ICD-10-CM | POA: Diagnosis not present

## 2020-03-31 DIAGNOSIS — Z940 Kidney transplant status: Secondary | ICD-10-CM | POA: Diagnosis not present

## 2020-03-31 DIAGNOSIS — K56609 Unspecified intestinal obstruction, unspecified as to partial versus complete obstruction: Secondary | ICD-10-CM | POA: Diagnosis not present

## 2020-03-31 DIAGNOSIS — E785 Hyperlipidemia, unspecified: Secondary | ICD-10-CM | POA: Diagnosis not present

## 2020-04-10 ENCOUNTER — Other Ambulatory Visit: Payer: Self-pay

## 2020-04-10 ENCOUNTER — Other Ambulatory Visit: Payer: Self-pay | Admitting: Internal Medicine

## 2020-04-10 DIAGNOSIS — Z1231 Encounter for screening mammogram for malignant neoplasm of breast: Secondary | ICD-10-CM

## 2020-04-22 ENCOUNTER — Encounter: Payer: Self-pay | Admitting: Cardiology

## 2020-04-22 ENCOUNTER — Ambulatory Visit (INDEPENDENT_AMBULATORY_CARE_PROVIDER_SITE_OTHER): Payer: Medicare Other | Admitting: Cardiology

## 2020-04-22 ENCOUNTER — Other Ambulatory Visit: Payer: Self-pay

## 2020-04-22 VITALS — BP 132/78 | HR 68 | Ht 67.0 in | Wt 190.4 lb

## 2020-04-22 DIAGNOSIS — R011 Cardiac murmur, unspecified: Secondary | ICD-10-CM | POA: Diagnosis not present

## 2020-04-22 DIAGNOSIS — I471 Supraventricular tachycardia: Secondary | ICD-10-CM | POA: Diagnosis not present

## 2020-04-22 DIAGNOSIS — Z712 Person consulting for explanation of examination or test findings: Secondary | ICD-10-CM | POA: Diagnosis not present

## 2020-04-22 DIAGNOSIS — R002 Palpitations: Secondary | ICD-10-CM

## 2020-04-22 DIAGNOSIS — I700 Atherosclerosis of aorta: Secondary | ICD-10-CM

## 2020-04-22 MED ORDER — METOPROLOL SUCCINATE ER 25 MG PO TB24: 25.0000 mg | ORAL_TABLET | Freq: Every day | ORAL | 3 refills | Status: AC

## 2020-04-22 NOTE — Progress Notes (Signed)
Cardiology Office Note:    Date:  04/22/2020   ID:  [name], DOB 1960/09/26, MRN [id number]  PCP:  Hoyt Koch, MD  Cardiologist:  Buford Dresser, MD  Referring MD: Hoyt Koch, *   CC: follow up  History of Present Illness:    [name] is a 60 y.o. female with a hx of kidney transplant who is seen for follow up today. I initially met her 01/27/20 as a new consult at the request of Hoyt Koch, * for the evaluation and management of palpitations.  Today: Reviewed results of monitor together today. Showed strips, discussed implications and options for management today. We discussed no medications, pill in a pocket medication (do not think this is a good option for her), or preventative medication. Discussed beta blockers vs. Calcium channel blockers. Discussed side effects of both classes. Her heart rate is in the 60s at baseline, so will need low dose. Discussed how to monitor heart rate and blood pressure.  Continues to have palpitations that make her stop, very unexpected. She is bothered significantly by this. Given this, will try preventative metoprolol.  Denies chest pain, shortness of breath at rest or with normal exertion. No PND, orthopnea, LE edema or unexpected weight gain. No syncope.  Past Medical History:  Diagnosis Date  . Acute blood loss anemia   . Anemia   . Anxiety   . Arthritis   . Claustrophobia   . Diverticulosis   . ESRD (end stage renal disease) on dialysis ([place])    "TTS; [facility]" (03/24/2015)  . GI bleed   . Glomerulonephritis   . Heart murmur   . HTN (hypertension)   . Hypothyroidism   . Kidney transplant recipient 01/21/2017  . Pancreatitis   . Renal insufficiency   . SBO (small bowel obstruction) ([place]) 08/07/2019  . Secondary hyperparathyroidism ([place])    [name] 05/11/2014    Past Surgical History:  Procedure Laterality Date  . ANTERIOR CERVICAL CORPECTOMY N/A 05/24/2017   Procedure:  CORPECTOMY CERVICAL FIVE- CERVICAL SIX;  Surgeon: Ashok Pall, MD;  Location: [place];  Service: Neurosurgery;  Laterality: N/A;  . ARTERIOVENOUS GRAFT PLACEMENT Right 1990's?  . ARTERIOVENOUS GRAFT PLACEMENT Left 07/2002   upper arm/notes 06/02/2010  . ARTERIOVENOUS GRAFT PLACEMENT Right 07/2003   upper arm/notes 06/02/2010  . AV FISTULA PLACEMENT Left 09/2000   upper arm/notes 06/02/2010  . BREAST BIOPSY Left unsure   benign  . COLONOSCOPY    . DILATATION & CURRETTAGE/HYSTEROSCOPY WITH RESECTOCOPE N/A 04/17/2013   Procedure: DILATATION & CURETTAGE/HYSTEROSCOPY WITH RESECTOCOPE;  Surgeon: Marvene Staff, MD;  Location: [facility];  Service: Gynecology;  Laterality: N/A;  . DILATION AND CURETTAGE OF UTERUS    . EYE SURGERY    . KIDNEY TRANSPLANT  1997  . KIDNEY TRANSPLANT  01/21/2017   Dr. Harrington Challenger  . PARATHYROIDECTOMY  03/2000 05/12/2014   w/neck exploration & autotransplantation/notes 06/02/2010; w/neck exploration  . PARATHYROIDECTOMY N/A 05/12/2014   Procedure: PARATHYROIDECTOMY AND NECK EXPLORATION;  Surgeon: Armandina Gemma, MD;  Location: [facility];  Service: General;  Laterality: N/A;  . PERITONEAL CATHETER INSERTION    . PERITONEAL CATHETER REMOVAL  08/1999   [name] 06/02/2010  . POSTERIOR CERVICAL FUSION/FORAMINOTOMY N/A 05/30/2017   Procedure: POSTERIOR CERVICAL Arthrodesis Cervical three - four, cervical four - five, cervical five - six, cervical six - seven;  Surgeon: Ashok Pall, MD;  Location: [facility];  Service: Neurosurgery;  Laterality: N/A;  . POSTERIOR CERVICAL LAMINECTOMY  N/A 08/12/2016   Procedure: POSTERIOR CERVICAL LAMINECTOMY CERVICAL THREE CERVICAL FOUR, CERVICAL FOUR CERVICAL FIVE, CERVICAL FIVE- CERVICAL SIX, CERVICAL SIX- CERVICAL SEVEN;  Surgeon: Ashok Pall, MD;  Location: [place];  Service: Neurosurgery;  Laterality: N/A;  POSTERIOR  . RETINAL DETACHMENT SURGERY Left   . THROMBECTOMY Left 02/2002   fistula/notes 06/02/2010  . THROMBECTOMY / ARTERIOVENOUS GRAFT REVISION  11/2003;  01/2004; 08/07/2005; 08/10/2005; 10/2005   [name] 06/02/2010  . THROMBECTOMY AND REVISION OF ARTERIOVENTOUS (AV) GORETEX  GRAFT Right 01/2002; 11/2003; 01/2004; 08/07/2004; 08/10/2004; 11/13/2005   [name] 5/1/2012Marland Kitchen [name] 5/16/2012Marland Kitchen [name] 5/16/2012Marland Kitchen [name] 5/16/2012Marland Kitchen [name] 06/02/2010; [name] 06/02/2010  . THROMBECTOMY AND REVISION OF ARTERIOVENTOUS (AV) GORETEX  GRAFT Left 08/23/2002; 09/13/2002; 07/08/2003   [name] 06/02/2010; [name] 06/02/2010; [name] 06/02/2010    Current Medications: Current Outpatient Medications on File Prior to Visit  Medication Sig  . acetaminophen (TYLENOL) 325 MG tablet Take 2 tablets (650 mg total) by mouth every 6 (six) hours as needed for mild pain (or Fever >/= 101).  Marland Kitchen ALPRAZolam (XANAX) 0.25 MG tablet Take 1 tablet by mouth daily as needed for anxiety.  Marland Kitchen aspirin 81 MG chewable tablet Chew 81 mg by mouth daily.  Marland Kitchen b complex-vitamin c-folic acid (NEPHRO-VITE) 0.8 MG TABS tablet Take 1 tablet by mouth daily.  Marland Kitchen gabapentin (NEURONTIN) 300 MG capsule TAKE 2 CAPSULES BY MOUTH 2 TIMES DAILY AS NEEDED FOR NERVE PAIN.  . mycophenolate (MYFORTIC) 180 MG EC tablet Take 180 mg by mouth 2 (two) times daily. TAKE 4 TABLETS TWICE EACH DAY.  Marland Kitchen predniSONE (DELTASONE) 2.5 MG tablet Take 2.5 mg by mouth every morning. Take with 742m qd to =7.567mqd total  . predniSONE (DELTASONE) 5 MG tablet Take 5 mg by mouth daily. Take with 2.42m53mo = 7.42mg88m total  . rosuvastatin (CRESTOR) 5 MG tablet Take 5 mg by mouth daily.  . sodium bicarbonate 650 MG tablet Take 650 mg by mouth daily.   . suMarland Kitchenfamethoxazole-trimethoprim (BACTRIM,SEPTRA) 400-80 MG tablet Take 1 tablet by mouth every Monday, Wednesday, and Friday.  . tacrolimus (PROGRAF) 1 mg/mL SUSP Take 2.5 mLs (2.5 mg total) by mouth 2 (two) times daily. (Patient taking differently: Take 2.5 mg by mouth 2 (two) times daily. TAKE TWO TABLETS IN THE MORNING AND ONE TABLET ON THE [place] No current facility-administered medications on file prior to  visit.     Allergies:   Contrast media [iodinated diagnostic agents], Nsaids, Penicillins, Ace inhibitors, Codeine, and Regadenoson   Social History   Tobacco Use  . Smoking status: Former Smoker    Packs/day: 0.12    Years: 30.00    Pack years: 3.60    Types: Cigarettes    Quit date: 01/21/2017    Years since quitting: 3.2  . Smokeless tobacco: Never Used  Vaping Use  . Vaping Use: Never used  Substance Use Topics  . Alcohol use: No  . Drug use: No    Family History: family history includes Diabetes in her maternal aunt and maternal uncle. She was adopted.  ROS:   Please see the history of present illness.  Additional pertinent ROS otherwise unremarkable.  EKGs/Labs/Other Studies Reviewed:    The following studies were reviewed today: Monitor 02/20/20: 8 days of data recorded on Zio monitor. Patient had a min HR of 56 bpm, max HR of 222 bpm, and avg HR of 78 bpm. Predominant underlying rhythm was Sinus Rhythm. No atrial fibrillation, high degree block, or pauses noted. There was a 4 beat  episode of nonsustained VT and 102 episodes of SVT. Longest SVT was 1 min 14 seconds at 133 bpm, fastest was 9.6 seconds at 222 bpm. Isolated atrial ectopy was frequent (5.9%) and ventricular ectopy was rare (<1%). There were 3 triggered events. Two were sinus rhythm with ectopy and one was sinus with SVT.  In Care Everywhere: -Zio monitor 03/05/17: I cannot see results -Echo 01/13/2017: SUMMARY  The left ventricular size is normal. There is normal left ventricular wall  thickness.   Left ventricular systolic function is normal. LV ejection fraction = 55-60%.   Left ventricular filling pattern is prolonged relaxation.  The right ventricle is normal in size and function.  There is mild mitral annular calcification. The mitral valve leaflets appear  thickened, but open well. There is mild mitral regurgitation.  Structurally normal tricuspid valve. There is moderate tricuspid  regurgitation.   The aortic root is normal size.  IVC size was normal.  There is no pericardial effusion.  Probably no significant change in comparison with the prior study noted.   NM myocardial stress 11/24/2014: reported as normal   EKG:  EKG is personally reviewed.  The ekg ordered 01/27/20 demonstrates sinus rhythm with frequent and consecutive PACs  Recent Labs: 08/06/2019: ALT 10 08/07/2019: BUN 19; Creatinine, Ser 1.60; Hemoglobin 14.1; Platelets 258; Potassium 5.0; Sodium 136  Recent Lipid Panel No results found for: CHOL, TRIG, HDL, CHOLHDL, VLDL, LDLCALC, LDLDIRECT  Physical Exam:    VS:  BP 132/78   Pulse 68   Ht _0  (1.702 m)   Wt 190 lb 6.4 oz (86.4 kg)   SpO2 97%   BMI 29.82 kg/m     Wt Readings from Last 3 Encounters:  04/22/20 190 lb 6.4 oz (86.4 kg)  01/27/20 193 lb (87.5 kg)  08/14/19 197 lb (89.4 kg)    GEN: Well nourished, well developed in no acute distress HEENT: Normal, moist mucous membranes NECK: No JVD CARDIAC: regular rhythm, normal S1 and S2, no rubs or gallops. 2/6 systolic murmur. VASCULAR: Radial and DP pulses 2+ bilaterally. No carotid bruits RESPIRATORY:  Clear to auscultation without rales, wheezing or rhonchi  ABDOMEN: Soft, non-tender, non-distended MUSCULOSKELETAL:  Ambulates independently SKIN: Warm and dry, no edema NEUROLOGIC:  Alert and oriented x 3. No focal neuro deficits noted. PSYCHIATRIC:  Normal affect    ASSESSMENT:    1. Paroxysmal SVT (supraventricular tachycardia) (HCC)   2. Palpitations   3. Murmur, cardiac   4. Aortic atherosclerosis ([place])   5. Encounter to discuss test results    PLAN:    Palpitations, PACs, paroxysmal SVT -reviewed monitor results today, showed and reviewed rhythm strips together today -after shared decision making, will pursue low dose metoprolol -she will contact me with any issues or concerns -no red flag symptoms  Heart murmur -prior echo with moderate TR, murmur is consistent with this  History  of renal transplant -per Dr Arty Baumgartner  Aortic atherosclerosis: most recent on CT 08/06/19 -currently on aspirin, statin  Cardiac risk counseling and prevention recommendations: -recommend heart healthy/Mediterranean diet, with whole grains, fruits, vegetable, fish, lean meats, nuts, and olive oil. Limit salt. -recommend moderate walking, 3-5 times/week for 30-50 minutes each session. Aim for at least 150 minutes.week. Goal should be pace of 3 miles/hours, or walking 1.5 miles in 30 minutes -recommend avoidance of tobacco products. Avoid excess alcohol. -ASCVD risk score: The 10-year ASCVD risk score Mikey Bussing DC Jr., et al., 2013) is: 10.3%   Values used to calculate the score:  Age: 42 years     Sex: Female     Is Non-Hispanic African American: Yes     Diabetic: No     Tobacco smoker: No     Systolic Blood Pressure: 010 mmHg     Is BP treated: Yes     HDL Cholesterol: 45 MG/DL     Total Cholesterol: 267 MG/DL    Plan for follow up: 6 mos or sooner as needed  Buford Dresser, MD, PhD, [facility]    Medication Adjustments/Labs and Tests Ordered: Current medicines are reviewed at length with the patient today.  Concerns regarding medicines are outlined above.  No orders of the defined types were placed in this encounter.  Meds ordered this encounter  Medications  . metoprolol succinate (TOPROL XL) 25 MG 24 hr tablet    Sig: Take 1 tablet (25 mg total) by mouth daily.    Dispense:  90 tablet    Refill:  3    Patient Instructions  Medication Instructions:  Start Metoprolol 25 mg daily  *If you need a refill on your cardiac medications before your next appointment, please call your pharmacy*   Lab Work: None   Testing/Procedures: None   Follow-Up: At [facility], you and your health needs are our priority.  As part of our continuing mission to provide you with exceptional heart care, we have created designated Provider Care Teams.   These Care Teams include your primary Cardiologist (physician) and Advanced Practice Providers (APPs -  Physician Assistants and Nurse Practitioners) who all work together to provide you with the care you need, when you need it.  We recommend signing up for the patient portal called "MyChart".  Sign up information is provided on this After Visit Summary.  MyChart is used to connect with patients for Virtual Visits (Telemedicine).  Patients are able to view lab/test results, encounter notes, upcoming appointments, etc.  Non-urgent messages can be sent to your provider as well.   To learn more about what you can do with MyChart, go to [email or web address].    Your next appointment:   6 month(s) @ [address] [place], [place] [address]   The format for your next appointment:   In Person  Provider:   Buford Dresser, MD       Signed, Buford Dresser, MD PhD 04/22/2020 10:42 AM    [place]

## 2020-04-22 NOTE — Patient Instructions (Signed)
Medication Instructions:  Start Metoprolol 25 mg daily  *If you need a refill on your cardiac medications before your next appointment, please call your pharmacy*   Lab Work: None   Testing/Procedures: None   Follow-Up: At [facility], you and your health needs are our priority.  As part of our continuing mission to provide you with exceptional heart care, we have created designated Provider Care Teams.  These Care Teams include your primary Cardiologist (physician) and Advanced Practice Providers (APPs -  Physician Assistants and Nurse Practitioners) who all work together to provide you with the care you need, when you need it.  We recommend signing up for the patient portal called "MyChart".  Sign up information is provided on this After Visit Summary.  MyChart is used to connect with patients for Virtual Visits (Telemedicine).  Patients are able to view lab/test results, encounter notes, upcoming appointments, etc.  Non-urgent messages can be sent to your provider as well.   To learn more about what you can do with MyChart, go to [email or web address].    Your next appointment:   6 month(s) @ [address] [place], [place] [address]   The format for your next appointment:   In Person  Provider:   Buford Dresser, MD

## 2020-06-01 DIAGNOSIS — Z940 Kidney transplant status: Secondary | ICD-10-CM | POA: Diagnosis not present

## 2020-06-03 ENCOUNTER — Ambulatory Visit
Admission: RE | Admit: 2020-06-03 | Discharge: 2020-06-03 | Disposition: A | Discharge status: Home / Self Care | Payer: Medicare Other | Source: Ambulatory Visit | Attending: Internal Medicine | Admitting: Internal Medicine

## 2020-06-03 ENCOUNTER — Other Ambulatory Visit: Payer: Self-pay

## 2020-06-03 DIAGNOSIS — Z1231 Encounter for screening mammogram for malignant neoplasm of breast: Secondary | ICD-10-CM

## 2020-06-13 DIAGNOSIS — R404 Transient alteration of awareness: Secondary | ICD-10-CM | POA: Diagnosis not present

## 2020-06-17 DIAGNOSIS — 419620001 Death: Secondary | SNOMED CT | POA: Diagnosis not present

## 2020-06-17 DEATH — deceased
